# Patient Record
Sex: Female | Born: 1950
Health system: Southern US, Community
[De-identification: ages and names within clinical notes are randomized; demographics above are authoritative.]

## PROBLEM LIST (undated history)

## (undated) DIAGNOSIS — M1990 Unspecified osteoarthritis, unspecified site: Secondary | ICD-10-CM

## (undated) DIAGNOSIS — N3010 Interstitial cystitis (chronic) without hematuria: Secondary | ICD-10-CM

## (undated) DIAGNOSIS — Z5189 Encounter for other specified aftercare: Secondary | ICD-10-CM

## (undated) DIAGNOSIS — R0981 Nasal congestion: Secondary | ICD-10-CM

## (undated) DIAGNOSIS — K625 Hemorrhage of anus and rectum: Secondary | ICD-10-CM

## (undated) DIAGNOSIS — M7989 Other specified soft tissue disorders: Secondary | ICD-10-CM

## (undated) DIAGNOSIS — I5030 Unspecified diastolic (congestive) heart failure: Secondary | ICD-10-CM

## (undated) DIAGNOSIS — R112 Nausea with vomiting, unspecified: Secondary | ICD-10-CM

## (undated) DIAGNOSIS — G44009 Cluster headache syndrome, unspecified, not intractable: Secondary | ICD-10-CM

## (undated) DIAGNOSIS — J029 Acute pharyngitis, unspecified: Secondary | ICD-10-CM

## (undated) DIAGNOSIS — M797 Fibromyalgia: Secondary | ICD-10-CM

## (undated) DIAGNOSIS — K449 Diaphragmatic hernia without obstruction or gangrene: Secondary | ICD-10-CM

## (undated) DIAGNOSIS — K439 Ventral hernia without obstruction or gangrene: Secondary | ICD-10-CM

## (undated) DIAGNOSIS — I639 Cerebral infarction, unspecified: Secondary | ICD-10-CM

## (undated) DIAGNOSIS — I509 Heart failure, unspecified: Secondary | ICD-10-CM

## (undated) DIAGNOSIS — R05 Cough: Secondary | ICD-10-CM

## (undated) DIAGNOSIS — L309 Dermatitis, unspecified: Secondary | ICD-10-CM

## (undated) DIAGNOSIS — I498 Other specified cardiac arrhythmias: Secondary | ICD-10-CM

## (undated) DIAGNOSIS — K219 Gastro-esophageal reflux disease without esophagitis: Secondary | ICD-10-CM

## (undated) DIAGNOSIS — R059 Cough, unspecified: Secondary | ICD-10-CM

## (undated) DIAGNOSIS — K209 Esophagitis, unspecified without bleeding: Secondary | ICD-10-CM

## (undated) DIAGNOSIS — H539 Unspecified visual disturbance: Secondary | ICD-10-CM

## (undated) DIAGNOSIS — I10 Essential (primary) hypertension: Secondary | ICD-10-CM

## (undated) DIAGNOSIS — F32A Depression, unspecified: Secondary | ICD-10-CM

## (undated) DIAGNOSIS — J189 Pneumonia, unspecified organism: Secondary | ICD-10-CM

## (undated) DIAGNOSIS — R053 Chronic cough: Secondary | ICD-10-CM

## (undated) DIAGNOSIS — D649 Anemia, unspecified: Secondary | ICD-10-CM

## (undated) DIAGNOSIS — T7840XA Allergy, unspecified, initial encounter: Secondary | ICD-10-CM

## (undated) DIAGNOSIS — F329 Major depressive disorder, single episode, unspecified: Secondary | ICD-10-CM

## (undated) DIAGNOSIS — F419 Anxiety disorder, unspecified: Secondary | ICD-10-CM

## (undated) DIAGNOSIS — M549 Dorsalgia, unspecified: Secondary | ICD-10-CM

## (undated) DIAGNOSIS — R350 Frequency of micturition: Secondary | ICD-10-CM

## (undated) DIAGNOSIS — G894 Chronic pain syndrome: Secondary | ICD-10-CM

## (undated) DIAGNOSIS — R011 Cardiac murmur, unspecified: Secondary | ICD-10-CM

## (undated) DIAGNOSIS — G8929 Other chronic pain: Secondary | ICD-10-CM

## (undated) DIAGNOSIS — N189 Chronic kidney disease, unspecified: Secondary | ICD-10-CM

## (undated) DIAGNOSIS — G629 Polyneuropathy, unspecified: Secondary | ICD-10-CM

## (undated) DIAGNOSIS — R140 Abdominal distension (gaseous): Secondary | ICD-10-CM

## (undated) DIAGNOSIS — R39198 Other difficulties with micturition: Secondary | ICD-10-CM

## (undated) DIAGNOSIS — K859 Acute pancreatitis without necrosis or infection, unspecified: Secondary | ICD-10-CM

## (undated) DIAGNOSIS — R509 Fever, unspecified: Secondary | ICD-10-CM

## (undated) DIAGNOSIS — IMO0001 Reserved for inherently not codable concepts without codable children: Secondary | ICD-10-CM

## (undated) HISTORY — DX: Cerebral infarction, unspecified: I63.9

## (undated) HISTORY — DX: Allergy, unspecified, initial encounter: T78.40XA

## (undated) HISTORY — PX: ABDOMINAL HYSTERECTOMY: SHX81

## (undated) HISTORY — DX: Cough: R05

## (undated) HISTORY — DX: Acute pharyngitis, unspecified: J02.9

## (undated) HISTORY — DX: Anemia, unspecified: D64.9

## (undated) HISTORY — DX: Esophagitis, unspecified without bleeding: K20.90

## (undated) HISTORY — DX: Hemorrhage of anus and rectum: K62.5

## (undated) HISTORY — DX: Unspecified osteoarthritis, unspecified site: M19.90

## (undated) HISTORY — DX: Cardiac murmur, unspecified: R01.1

## (undated) HISTORY — PX: DENTAL SURGERY: SHX609

## (undated) HISTORY — DX: Essential (primary) hypertension: I10

## (undated) HISTORY — DX: Nasal congestion: R09.81

## (undated) HISTORY — DX: Unspecified diastolic (congestive) heart failure: I50.30

## (undated) HISTORY — DX: Chronic pain syndrome: G89.4

## (undated) HISTORY — DX: Fever, unspecified: R50.9

## (undated) HISTORY — DX: Unspecified visual disturbance: H53.9

## (undated) HISTORY — PX: CHOLECYSTECTOMY: SHX55

## (undated) HISTORY — DX: Other difficulties with micturition: R39.198

## (undated) HISTORY — DX: Depression, unspecified: F32.A

## (undated) HISTORY — DX: Cough, unspecified: R05.9

## (undated) HISTORY — DX: Fibromyalgia: M79.7

## (undated) HISTORY — DX: Cluster headache syndrome, unspecified, not intractable: G44.009

## (undated) HISTORY — DX: Nausea with vomiting, unspecified: R11.2

## (undated) HISTORY — DX: Major depressive disorder, single episode, unspecified: F32.9

## (undated) HISTORY — PX: OTHER SURGICAL HISTORY: SHX169

## (undated) HISTORY — DX: Diaphragmatic hernia without obstruction or gangrene: K44.9

## (undated) HISTORY — DX: Gastro-esophageal reflux disease without esophagitis: K21.9

## (undated) HISTORY — DX: Acute pancreatitis without necrosis or infection, unspecified: K85.90

## (undated) HISTORY — DX: Abdominal distension (gaseous): R14.0

## (undated) HISTORY — DX: Ventral hernia without obstruction or gangrene: K43.9

## (undated) HISTORY — DX: Other specified soft tissue disorders: M79.89

## (undated) HISTORY — PX: HERNIA REPAIR: SHX51

## (undated) HISTORY — PX: UPPER GASTROINTESTINAL ENDOSCOPY: SHX188

---

## 1997-10-02 ENCOUNTER — Encounter: Admission: RE | Admit: 1997-10-02 | Discharge: 1997-10-02 | Payer: Self-pay | Admitting: Family Medicine

## 1997-10-06 ENCOUNTER — Encounter: Admission: RE | Admit: 1997-10-06 | Discharge: 1997-10-06 | Payer: Self-pay | Admitting: Family Medicine

## 1997-10-18 ENCOUNTER — Encounter: Admission: RE | Admit: 1997-10-18 | Discharge: 1997-10-18 | Payer: Self-pay | Admitting: Family Medicine

## 1997-10-20 ENCOUNTER — Encounter: Admission: RE | Admit: 1997-10-20 | Discharge: 1997-10-20 | Payer: Self-pay | Admitting: Family Medicine

## 1997-10-30 ENCOUNTER — Encounter: Admission: RE | Admit: 1997-10-30 | Discharge: 1997-10-30 | Payer: Self-pay | Admitting: Family Medicine

## 1998-01-09 ENCOUNTER — Encounter: Admission: RE | Admit: 1998-01-09 | Discharge: 1998-01-09 | Payer: Self-pay | Admitting: Family Medicine

## 1998-01-25 ENCOUNTER — Encounter: Admission: RE | Admit: 1998-01-25 | Discharge: 1998-01-25 | Payer: Self-pay | Admitting: Family Medicine

## 1998-02-12 ENCOUNTER — Encounter: Admission: RE | Admit: 1998-02-12 | Discharge: 1998-02-12 | Payer: Self-pay | Admitting: Family Medicine

## 1998-02-20 ENCOUNTER — Encounter: Admission: RE | Admit: 1998-02-20 | Discharge: 1998-02-20 | Payer: Self-pay | Admitting: Family Medicine

## 1998-03-02 ENCOUNTER — Encounter: Admission: RE | Admit: 1998-03-02 | Discharge: 1998-03-02 | Payer: Self-pay | Admitting: Family Medicine

## 1998-03-08 ENCOUNTER — Encounter: Admission: RE | Admit: 1998-03-08 | Discharge: 1998-03-08 | Payer: Self-pay | Admitting: Family Medicine

## 1998-03-26 ENCOUNTER — Encounter: Payer: Self-pay | Admitting: Emergency Medicine

## 1998-03-26 ENCOUNTER — Inpatient Hospital Stay (HOSPITAL_COMMUNITY): Admission: EM | Admit: 1998-03-26 | Discharge: 1998-03-28 | Payer: Self-pay | Admitting: Emergency Medicine

## 1998-03-29 ENCOUNTER — Encounter: Admission: RE | Admit: 1998-03-29 | Discharge: 1998-03-29 | Payer: Self-pay | Admitting: Family Medicine

## 1998-04-05 ENCOUNTER — Encounter: Admission: RE | Admit: 1998-04-05 | Discharge: 1998-04-05 | Payer: Self-pay | Admitting: Family Medicine

## 1998-06-19 ENCOUNTER — Encounter: Admission: RE | Admit: 1998-06-19 | Discharge: 1998-06-19 | Payer: Self-pay | Admitting: Family Medicine

## 1998-07-25 ENCOUNTER — Encounter: Admission: RE | Admit: 1998-07-25 | Discharge: 1998-07-25 | Payer: Self-pay | Admitting: Family Medicine

## 1998-07-27 ENCOUNTER — Ambulatory Visit (HOSPITAL_COMMUNITY): Admission: RE | Admit: 1998-07-27 | Discharge: 1998-07-27 | Payer: Self-pay | Admitting: *Deleted

## 1998-08-09 ENCOUNTER — Encounter: Admission: RE | Admit: 1998-08-09 | Discharge: 1998-08-09 | Payer: Self-pay | Admitting: Family Medicine

## 1998-08-22 ENCOUNTER — Encounter: Admission: RE | Admit: 1998-08-22 | Discharge: 1998-08-22 | Payer: Self-pay | Admitting: Family Medicine

## 1998-09-22 ENCOUNTER — Encounter: Payer: Self-pay | Admitting: Emergency Medicine

## 1998-09-22 ENCOUNTER — Emergency Department (HOSPITAL_COMMUNITY): Admission: EM | Admit: 1998-09-22 | Discharge: 1998-09-22 | Payer: Self-pay | Admitting: Emergency Medicine

## 1999-01-24 ENCOUNTER — Encounter: Admission: RE | Admit: 1999-01-24 | Discharge: 1999-01-24 | Payer: Self-pay | Admitting: Family Medicine

## 1999-02-18 ENCOUNTER — Emergency Department (HOSPITAL_COMMUNITY): Admission: EM | Admit: 1999-02-18 | Discharge: 1999-02-18 | Payer: Self-pay | Admitting: Emergency Medicine

## 1999-02-20 ENCOUNTER — Emergency Department (HOSPITAL_COMMUNITY): Admission: EM | Admit: 1999-02-20 | Discharge: 1999-02-20 | Payer: Self-pay | Admitting: Internal Medicine

## 1999-03-01 ENCOUNTER — Emergency Department (HOSPITAL_COMMUNITY): Admission: EM | Admit: 1999-03-01 | Discharge: 1999-03-01 | Payer: Self-pay | Admitting: Emergency Medicine

## 1999-03-03 ENCOUNTER — Emergency Department (HOSPITAL_COMMUNITY): Admission: EM | Admit: 1999-03-03 | Discharge: 1999-03-03 | Payer: Self-pay | Admitting: Emergency Medicine

## 1999-03-05 ENCOUNTER — Emergency Department (HOSPITAL_COMMUNITY): Admission: EM | Admit: 1999-03-05 | Discharge: 1999-03-05 | Payer: Self-pay | Admitting: Emergency Medicine

## 1999-03-23 ENCOUNTER — Ambulatory Visit (HOSPITAL_COMMUNITY): Admission: RE | Admit: 1999-03-23 | Discharge: 1999-03-23 | Payer: Self-pay | Admitting: Orthopedic Surgery

## 1999-03-23 ENCOUNTER — Encounter: Payer: Self-pay | Admitting: Orthopedic Surgery

## 1999-05-22 ENCOUNTER — Emergency Department (HOSPITAL_COMMUNITY): Admission: EM | Admit: 1999-05-22 | Discharge: 1999-05-23 | Payer: Self-pay | Admitting: Emergency Medicine

## 1999-05-24 ENCOUNTER — Encounter: Admission: RE | Admit: 1999-05-24 | Discharge: 1999-05-24 | Payer: Self-pay | Admitting: Family Medicine

## 1999-05-24 ENCOUNTER — Encounter: Payer: Self-pay | Admitting: Sports Medicine

## 1999-05-24 ENCOUNTER — Encounter: Admission: RE | Admit: 1999-05-24 | Discharge: 1999-05-24 | Payer: Self-pay | Admitting: Sports Medicine

## 1999-06-12 ENCOUNTER — Encounter: Admission: RE | Admit: 1999-06-12 | Discharge: 1999-06-12 | Payer: Self-pay | Admitting: Family Medicine

## 1999-06-28 ENCOUNTER — Encounter: Payer: Self-pay | Admitting: Orthopedic Surgery

## 1999-06-28 ENCOUNTER — Ambulatory Visit (HOSPITAL_COMMUNITY): Admission: RE | Admit: 1999-06-28 | Discharge: 1999-06-28 | Payer: Self-pay | Admitting: Orthopedic Surgery

## 1999-07-25 ENCOUNTER — Encounter: Admission: RE | Admit: 1999-07-25 | Discharge: 1999-07-25 | Payer: Self-pay | Admitting: Family Medicine

## 1999-07-29 ENCOUNTER — Encounter: Admission: RE | Admit: 1999-07-29 | Discharge: 1999-07-29 | Payer: Self-pay | Admitting: Family Medicine

## 1999-09-25 ENCOUNTER — Encounter: Admission: RE | Admit: 1999-09-25 | Discharge: 1999-09-25 | Payer: Self-pay | Admitting: Family Medicine

## 2000-01-01 ENCOUNTER — Encounter: Admission: RE | Admit: 2000-01-01 | Discharge: 2000-01-01 | Payer: Self-pay | Admitting: Family Medicine

## 2000-01-22 ENCOUNTER — Encounter: Admission: RE | Admit: 2000-01-22 | Discharge: 2000-01-22 | Payer: Self-pay | Admitting: Sports Medicine

## 2000-03-24 ENCOUNTER — Encounter: Admission: RE | Admit: 2000-03-24 | Discharge: 2000-03-24 | Payer: Self-pay | Admitting: Family Medicine

## 2000-05-25 ENCOUNTER — Encounter: Admission: RE | Admit: 2000-05-25 | Discharge: 2000-05-25 | Payer: Self-pay | Admitting: Family Medicine

## 2000-07-13 ENCOUNTER — Encounter: Admission: RE | Admit: 2000-07-13 | Discharge: 2000-07-13 | Payer: Self-pay | Admitting: Family Medicine

## 2000-07-13 ENCOUNTER — Encounter: Admission: RE | Admit: 2000-07-13 | Discharge: 2000-07-13 | Payer: Self-pay | Admitting: *Deleted

## 2000-07-16 ENCOUNTER — Encounter: Admission: RE | Admit: 2000-07-16 | Discharge: 2000-07-16 | Payer: Self-pay | Admitting: Family Medicine

## 2000-07-22 ENCOUNTER — Encounter: Admission: RE | Admit: 2000-07-22 | Discharge: 2000-07-22 | Payer: Self-pay | Admitting: Family Medicine

## 2000-10-07 ENCOUNTER — Encounter: Admission: RE | Admit: 2000-10-07 | Discharge: 2000-10-07 | Payer: Self-pay | Admitting: Family Medicine

## 2000-10-09 ENCOUNTER — Encounter: Admission: RE | Admit: 2000-10-09 | Discharge: 2000-10-09 | Payer: Self-pay | Admitting: Family Medicine

## 2000-10-13 ENCOUNTER — Encounter: Admission: RE | Admit: 2000-10-13 | Discharge: 2000-10-13 | Payer: Self-pay | Admitting: Family Medicine

## 2000-10-13 ENCOUNTER — Ambulatory Visit (HOSPITAL_COMMUNITY): Admission: RE | Admit: 2000-10-13 | Discharge: 2000-10-13 | Payer: Self-pay | Admitting: Family Medicine

## 2000-11-20 ENCOUNTER — Encounter: Admission: RE | Admit: 2000-11-20 | Discharge: 2000-11-20 | Payer: Self-pay | Admitting: Family Medicine

## 2000-12-24 ENCOUNTER — Encounter: Admission: RE | Admit: 2000-12-24 | Discharge: 2000-12-24 | Payer: Self-pay | Admitting: Family Medicine

## 2000-12-28 ENCOUNTER — Encounter: Admission: RE | Admit: 2000-12-28 | Discharge: 2000-12-28 | Payer: Self-pay | Admitting: Family Medicine

## 2001-05-21 ENCOUNTER — Encounter: Admission: RE | Admit: 2001-05-21 | Discharge: 2001-05-21 | Payer: Self-pay | Admitting: Family Medicine

## 2001-05-24 ENCOUNTER — Encounter: Admission: RE | Admit: 2001-05-24 | Discharge: 2001-05-24 | Payer: Self-pay | Admitting: Family Medicine

## 2001-09-10 ENCOUNTER — Encounter: Admission: RE | Admit: 2001-09-10 | Discharge: 2001-09-10 | Payer: Self-pay | Admitting: Family Medicine

## 2001-11-26 ENCOUNTER — Encounter: Admission: RE | Admit: 2001-11-26 | Discharge: 2001-11-26 | Payer: Self-pay | Admitting: Family Medicine

## 2001-11-29 ENCOUNTER — Encounter: Admission: RE | Admit: 2001-11-29 | Discharge: 2001-11-29 | Payer: Self-pay | Admitting: Family Medicine

## 2002-09-21 ENCOUNTER — Encounter: Admission: RE | Admit: 2002-09-21 | Discharge: 2002-09-21 | Payer: Self-pay | Admitting: Family Medicine

## 2003-01-05 ENCOUNTER — Emergency Department (HOSPITAL_COMMUNITY): Admission: EM | Admit: 2003-01-05 | Discharge: 2003-01-06 | Payer: Self-pay | Admitting: Emergency Medicine

## 2003-01-06 ENCOUNTER — Encounter: Payer: Self-pay | Admitting: Emergency Medicine

## 2003-01-10 ENCOUNTER — Encounter: Admission: RE | Admit: 2003-01-10 | Discharge: 2003-01-10 | Payer: Self-pay | Admitting: Family Medicine

## 2003-02-17 ENCOUNTER — Encounter: Admission: RE | Admit: 2003-02-17 | Discharge: 2003-02-17 | Payer: Self-pay | Admitting: Family Medicine

## 2003-03-02 ENCOUNTER — Encounter: Admission: RE | Admit: 2003-03-02 | Discharge: 2003-03-02 | Payer: Self-pay | Admitting: Family Medicine

## 2003-03-10 ENCOUNTER — Encounter: Admission: RE | Admit: 2003-03-10 | Discharge: 2003-03-10 | Payer: Self-pay | Admitting: Family Medicine

## 2003-03-13 ENCOUNTER — Encounter: Admission: RE | Admit: 2003-03-13 | Discharge: 2003-03-13 | Payer: Self-pay | Admitting: Sports Medicine

## 2003-05-29 ENCOUNTER — Encounter: Admission: RE | Admit: 2003-05-29 | Discharge: 2003-05-29 | Payer: Self-pay | Admitting: Family Medicine

## 2003-06-29 ENCOUNTER — Encounter: Admission: RE | Admit: 2003-06-29 | Discharge: 2003-06-29 | Payer: Self-pay | Admitting: Family Medicine

## 2004-02-23 ENCOUNTER — Ambulatory Visit: Payer: Self-pay | Admitting: Family Medicine

## 2004-05-23 ENCOUNTER — Ambulatory Visit: Payer: Self-pay | Admitting: Family Medicine

## 2004-06-03 ENCOUNTER — Emergency Department (HOSPITAL_COMMUNITY): Admission: EM | Admit: 2004-06-03 | Discharge: 2004-06-03 | Payer: Self-pay | Admitting: Emergency Medicine

## 2004-06-05 ENCOUNTER — Ambulatory Visit: Payer: Self-pay | Admitting: Sports Medicine

## 2004-06-26 ENCOUNTER — Encounter: Admission: RE | Admit: 2004-06-26 | Discharge: 2004-06-26 | Payer: Self-pay | Admitting: Family Medicine

## 2004-06-28 ENCOUNTER — Ambulatory Visit: Payer: Self-pay | Admitting: Family Medicine

## 2004-07-05 ENCOUNTER — Encounter: Admission: RE | Admit: 2004-07-05 | Discharge: 2004-07-05 | Payer: Self-pay | Admitting: Sports Medicine

## 2004-07-25 ENCOUNTER — Ambulatory Visit: Payer: Self-pay | Admitting: Sports Medicine

## 2004-08-24 ENCOUNTER — Ambulatory Visit (HOSPITAL_COMMUNITY): Admission: RE | Admit: 2004-08-24 | Discharge: 2004-08-24 | Payer: Self-pay | Admitting: Otolaryngology

## 2004-09-06 ENCOUNTER — Ambulatory Visit (HOSPITAL_COMMUNITY): Admission: RE | Admit: 2004-09-06 | Discharge: 2004-09-06 | Payer: Self-pay | Admitting: Otolaryngology

## 2004-11-19 ENCOUNTER — Ambulatory Visit: Payer: Self-pay | Admitting: Sports Medicine

## 2005-03-26 ENCOUNTER — Ambulatory Visit: Payer: Self-pay | Admitting: Family Medicine

## 2005-04-21 DIAGNOSIS — K219 Gastro-esophageal reflux disease without esophagitis: Secondary | ICD-10-CM

## 2005-04-21 HISTORY — PX: COLONOSCOPY: SHX174

## 2005-04-21 HISTORY — DX: Gastro-esophageal reflux disease without esophagitis: K21.9

## 2005-06-30 ENCOUNTER — Ambulatory Visit: Payer: Self-pay | Admitting: Sports Medicine

## 2005-08-19 ENCOUNTER — Encounter (INDEPENDENT_AMBULATORY_CARE_PROVIDER_SITE_OTHER): Payer: Self-pay | Admitting: *Deleted

## 2005-08-19 LAB — CONVERTED CEMR LAB

## 2005-08-20 ENCOUNTER — Ambulatory Visit: Payer: Self-pay | Admitting: Family Medicine

## 2005-10-29 ENCOUNTER — Emergency Department (HOSPITAL_COMMUNITY): Admission: EM | Admit: 2005-10-29 | Discharge: 2005-10-29 | Payer: Self-pay | Admitting: Family Medicine

## 2005-10-31 ENCOUNTER — Ambulatory Visit: Payer: Self-pay | Admitting: Family Medicine

## 2006-01-23 ENCOUNTER — Encounter (INDEPENDENT_AMBULATORY_CARE_PROVIDER_SITE_OTHER): Payer: Self-pay | Admitting: Specialist

## 2006-01-23 ENCOUNTER — Ambulatory Visit (HOSPITAL_COMMUNITY): Admission: RE | Admit: 2006-01-23 | Discharge: 2006-01-23 | Payer: Self-pay | Admitting: Gastroenterology

## 2006-06-19 ENCOUNTER — Encounter (INDEPENDENT_AMBULATORY_CARE_PROVIDER_SITE_OTHER): Payer: Self-pay | Admitting: *Deleted

## 2006-07-24 ENCOUNTER — Encounter (INDEPENDENT_AMBULATORY_CARE_PROVIDER_SITE_OTHER): Payer: Self-pay | Admitting: Family Medicine

## 2006-10-20 ENCOUNTER — Encounter: Payer: Self-pay | Admitting: *Deleted

## 2006-10-21 ENCOUNTER — Encounter (INDEPENDENT_AMBULATORY_CARE_PROVIDER_SITE_OTHER): Payer: Self-pay | Admitting: Family Medicine

## 2006-10-21 ENCOUNTER — Ambulatory Visit: Payer: Self-pay | Admitting: Family Medicine

## 2006-10-21 DIAGNOSIS — I10 Essential (primary) hypertension: Secondary | ICD-10-CM | POA: Insufficient documentation

## 2006-10-21 DIAGNOSIS — R32 Unspecified urinary incontinence: Secondary | ICD-10-CM | POA: Insufficient documentation

## 2006-10-21 LAB — CONVERTED CEMR LAB
ALT: 17 units/L (ref 0–35)
AST: 19 units/L (ref 0–37)
Albumin: 4.6 g/dL (ref 3.5–5.2)
Alkaline Phosphatase: 62 units/L (ref 39–117)
BUN: 18 mg/dL (ref 6–23)
Bilirubin Urine: NEGATIVE
Blood in Urine, dipstick: NEGATIVE
CO2: 26 meq/L (ref 19–32)
Calcium: 9.9 mg/dL (ref 8.4–10.5)
Chloride: 100 meq/L (ref 96–112)
Creatinine, Ser: 0.86 mg/dL (ref 0.40–1.20)
Glucose, Bld: 103 mg/dL — ABNORMAL HIGH (ref 70–99)
Glucose, Urine, Semiquant: NEGATIVE
HCT: 39.8 % (ref 36.0–46.0)
Hemoglobin: 12.3 g/dL (ref 12.0–15.0)
Ketones, urine, test strip: NEGATIVE
MCHC: 30.9 g/dL (ref 30.0–36.0)
MCV: 91.7 fL (ref 78.0–100.0)
Nitrite: NEGATIVE
Platelets: 229 10*3/uL (ref 150–400)
Potassium: 4.3 meq/L (ref 3.5–5.3)
Protein, U semiquant: NEGATIVE
RBC: 4.34 M/uL (ref 3.87–5.11)
RDW: 13.4 % (ref 11.5–14.0)
Sodium: 140 meq/L (ref 135–145)
Specific Gravity, Urine: 1.025
TSH: 1.789 microintl units/mL (ref 0.350–5.50)
Total Bilirubin: 0.5 mg/dL (ref 0.3–1.2)
Total Protein: 8 g/dL (ref 6.0–8.3)
Urobilinogen, UA: NEGATIVE
WBC Urine, dipstick: NEGATIVE
WBC: 5 10*3/uL (ref 4.0–10.5)
pH: 5.5

## 2006-10-22 ENCOUNTER — Encounter (INDEPENDENT_AMBULATORY_CARE_PROVIDER_SITE_OTHER): Payer: Self-pay | Admitting: Family Medicine

## 2007-02-05 ENCOUNTER — Encounter: Payer: Self-pay | Admitting: Family Medicine

## 2007-02-25 ENCOUNTER — Encounter (INDEPENDENT_AMBULATORY_CARE_PROVIDER_SITE_OTHER): Payer: Self-pay | Admitting: *Deleted

## 2007-04-22 HISTORY — PX: CYSTOSCOPY: SUR368

## 2007-05-12 ENCOUNTER — Telehealth: Payer: Self-pay | Admitting: *Deleted

## 2007-05-19 ENCOUNTER — Ambulatory Visit: Payer: Self-pay | Admitting: Family Medicine

## 2007-08-10 ENCOUNTER — Encounter: Payer: Self-pay | Admitting: Family Medicine

## 2007-08-10 ENCOUNTER — Ambulatory Visit: Payer: Self-pay | Admitting: Family Medicine

## 2007-08-10 LAB — CONVERTED CEMR LAB
ALT: 23 units/L (ref 0–35)
AST: 21 units/L (ref 0–37)
Albumin: 4.4 g/dL (ref 3.5–5.2)
Alkaline Phosphatase: 61 units/L (ref 39–117)
BUN: 20 mg/dL (ref 6–23)
Bilirubin Urine: NEGATIVE
Blood in Urine, dipstick: NEGATIVE
CO2: 26 meq/L (ref 19–32)
Calcium: 9.3 mg/dL (ref 8.4–10.5)
Chloride: 101 meq/L (ref 96–112)
Cholesterol: 200 mg/dL (ref 0–200)
Creatinine, Ser: 0.87 mg/dL (ref 0.40–1.20)
Glucose, Bld: 110 mg/dL — ABNORMAL HIGH (ref 70–99)
Glucose, Urine, Semiquant: NEGATIVE
HDL: 51 mg/dL (ref >39)
Ketones, urine, test strip: NEGATIVE
LDL Cholesterol: 91 mg/dL (ref 0–99)
Nitrite: NEGATIVE
Pap Smear: NORMAL
Potassium: 4.1 meq/L (ref 3.5–5.3)
Protein, U semiquant: NEGATIVE
Sodium: 140 meq/L (ref 135–145)
Specific Gravity, Urine: 1.02
Total Bilirubin: 0.4 mg/dL (ref 0.3–1.2)
Total CHOL/HDL Ratio: 3.9
Total Protein: 7.8 g/dL (ref 6.0–8.3)
Triglycerides: 290 mg/dL — ABNORMAL HIGH (ref <150)
Urobilinogen, UA: 0.2
VLDL: 58 mg/dL — ABNORMAL HIGH (ref 0–40)
WBC Urine, dipstick: NEGATIVE
pH: 7

## 2007-08-23 ENCOUNTER — Encounter: Payer: Self-pay | Admitting: Family Medicine

## 2007-11-24 ENCOUNTER — Ambulatory Visit: Payer: Self-pay | Admitting: Family Medicine

## 2007-11-24 LAB — CONVERTED CEMR LAB
Bilirubin Urine: NEGATIVE
Blood in Urine, dipstick: NEGATIVE
Glucose, Urine, Semiquant: NEGATIVE
Ketones, urine, test strip: NEGATIVE
Nitrite: NEGATIVE
Protein, U semiquant: NEGATIVE
Specific Gravity, Urine: 1.02
Urobilinogen, UA: 0.2
WBC Urine, dipstick: NEGATIVE
pH: 6.5

## 2007-12-26 ENCOUNTER — Emergency Department (HOSPITAL_COMMUNITY): Admission: EM | Admit: 2007-12-26 | Discharge: 2007-12-26 | Payer: Self-pay | Admitting: Emergency Medicine

## 2008-01-24 ENCOUNTER — Ambulatory Visit: Payer: Self-pay | Admitting: Family Medicine

## 2008-01-24 ENCOUNTER — Encounter: Payer: Self-pay | Admitting: Family Medicine

## 2008-02-08 ENCOUNTER — Telehealth: Payer: Self-pay | Admitting: *Deleted

## 2008-02-09 ENCOUNTER — Telehealth: Payer: Self-pay | Admitting: *Deleted

## 2008-02-11 ENCOUNTER — Encounter: Admission: RE | Admit: 2008-02-11 | Discharge: 2008-02-11 | Payer: Self-pay | Admitting: Family Medicine

## 2008-02-11 ENCOUNTER — Ambulatory Visit: Payer: Self-pay | Admitting: Family Medicine

## 2008-02-11 DIAGNOSIS — K219 Gastro-esophageal reflux disease without esophagitis: Secondary | ICD-10-CM | POA: Insufficient documentation

## 2008-02-21 ENCOUNTER — Telehealth (INDEPENDENT_AMBULATORY_CARE_PROVIDER_SITE_OTHER): Payer: Self-pay | Admitting: *Deleted

## 2008-03-01 ENCOUNTER — Ambulatory Visit: Payer: Self-pay | Admitting: Gastroenterology

## 2008-03-03 ENCOUNTER — Ambulatory Visit (HOSPITAL_BASED_OUTPATIENT_CLINIC_OR_DEPARTMENT_OTHER): Admission: RE | Admit: 2008-03-03 | Discharge: 2008-03-03 | Payer: Self-pay | Admitting: Urology

## 2008-03-10 ENCOUNTER — Ambulatory Visit: Payer: Self-pay | Admitting: Obstetrics & Gynecology

## 2008-03-24 ENCOUNTER — Ambulatory Visit (HOSPITAL_COMMUNITY): Admission: RE | Admit: 2008-03-24 | Discharge: 2008-03-24 | Payer: Self-pay | Admitting: Obstetrics & Gynecology

## 2008-06-14 ENCOUNTER — Ambulatory Visit: Payer: Self-pay | Admitting: Family Medicine

## 2008-09-13 ENCOUNTER — Encounter: Payer: Self-pay | Admitting: Family Medicine

## 2008-11-01 ENCOUNTER — Emergency Department (HOSPITAL_COMMUNITY): Admission: EM | Admit: 2008-11-01 | Discharge: 2008-11-01 | Payer: Self-pay | Admitting: Emergency Medicine

## 2008-11-30 ENCOUNTER — Emergency Department (HOSPITAL_COMMUNITY): Admission: EM | Admit: 2008-11-30 | Discharge: 2008-11-30 | Payer: Self-pay | Admitting: Emergency Medicine

## 2008-12-06 ENCOUNTER — Encounter: Admission: RE | Admit: 2008-12-06 | Discharge: 2008-12-06 | Payer: Self-pay | Admitting: Occupational Medicine

## 2008-12-30 ENCOUNTER — Encounter (INDEPENDENT_AMBULATORY_CARE_PROVIDER_SITE_OTHER): Payer: Self-pay | Admitting: *Deleted

## 2008-12-30 DIAGNOSIS — Z716 Tobacco abuse counseling: Secondary | ICD-10-CM | POA: Insufficient documentation

## 2009-01-11 ENCOUNTER — Ambulatory Visit (HOSPITAL_COMMUNITY): Admission: RE | Admit: 2009-01-11 | Discharge: 2009-01-11 | Payer: Self-pay | Admitting: Occupational Medicine

## 2009-01-18 ENCOUNTER — Encounter: Admission: RE | Admit: 2009-01-18 | Discharge: 2009-04-18 | Payer: Self-pay | Admitting: Occupational Medicine

## 2009-04-21 HISTORY — PX: EYE SURGERY: SHX253

## 2009-04-23 ENCOUNTER — Encounter: Admission: RE | Admit: 2009-04-23 | Discharge: 2009-07-22 | Payer: Self-pay | Admitting: Occupational Medicine

## 2009-05-14 ENCOUNTER — Encounter: Payer: Self-pay | Admitting: Family Medicine

## 2009-07-02 ENCOUNTER — Encounter: Payer: Self-pay | Admitting: Family Medicine

## 2009-07-23 ENCOUNTER — Encounter: Admission: RE | Admit: 2009-07-23 | Discharge: 2009-10-21 | Payer: Self-pay | Admitting: Occupational Medicine

## 2009-08-14 ENCOUNTER — Inpatient Hospital Stay (HOSPITAL_COMMUNITY): Admission: AD | Admit: 2009-08-14 | Discharge: 2009-08-16 | Payer: 59 | Admitting: Family Medicine

## 2009-08-14 ENCOUNTER — Encounter: Payer: Self-pay | Admitting: Family Medicine

## 2009-08-14 ENCOUNTER — Telehealth (INDEPENDENT_AMBULATORY_CARE_PROVIDER_SITE_OTHER): Payer: Self-pay | Admitting: *Deleted

## 2009-08-14 ENCOUNTER — Ambulatory Visit: Payer: Self-pay | Admitting: Family Medicine

## 2009-08-14 DIAGNOSIS — Z9849 Cataract extraction status, unspecified eye: Secondary | ICD-10-CM | POA: Insufficient documentation

## 2009-08-14 DIAGNOSIS — G894 Chronic pain syndrome: Secondary | ICD-10-CM | POA: Insufficient documentation

## 2009-08-14 DIAGNOSIS — F323 Major depressive disorder, single episode, severe with psychotic features: Secondary | ICD-10-CM | POA: Insufficient documentation

## 2009-08-14 DIAGNOSIS — F32A Depression, unspecified: Secondary | ICD-10-CM | POA: Insufficient documentation

## 2009-08-14 LAB — CONVERTED CEMR LAB
ALT: 29 units/L
AST: 23 units/L
Albumin: 3.8 g/dL
Alkaline Phosphatase: 60 units/L
BUN: 14 mg/dL
CO2: 27 meq/L
Calcium: 9.1 mg/dL
Chloride: 100 meq/L
Creatinine, Ser: 0.7 mg/dL
Glucose, Bld: 177 mg/dL
HCT: 37.9 %
Hemoglobin: 12.9 g/dL
Platelets: 215 10*3/uL
Potassium: 3.9 meq/L
Sodium: 134 meq/L
TSH: 1.486 microintl units/mL
Total Bilirubin: 0.5 mg/dL
Total Protein: 7.3 g/dL
WBC: 9.3 10*3/uL

## 2009-08-17 ENCOUNTER — Telehealth: Payer: Self-pay | Admitting: *Deleted

## 2009-08-23 ENCOUNTER — Telehealth: Payer: Self-pay | Admitting: *Deleted

## 2009-08-24 ENCOUNTER — Telehealth: Payer: Self-pay | Admitting: *Deleted

## 2009-08-27 ENCOUNTER — Ambulatory Visit: Payer: Self-pay | Admitting: Family Medicine

## 2009-08-27 ENCOUNTER — Encounter: Payer: Self-pay | Admitting: Family Medicine

## 2009-08-27 LAB — CONVERTED CEMR LAB
HCT: 40.2 % (ref 36.0–46.0)
Hemoglobin: 13.1 g/dL (ref 12.0–15.0)
MCHC: 32.6 g/dL (ref 30.0–36.0)
MCV: 88.4 fL (ref 78.0–100.0)
Platelets: 201 10*3/uL (ref 150–400)
RBC: 4.55 M/uL (ref 3.87–5.11)
RDW: 14.5 % (ref 11.5–15.5)
WBC: 7.4 10*3/uL (ref 4.0–10.5)

## 2009-08-28 ENCOUNTER — Encounter: Payer: Self-pay | Admitting: Family Medicine

## 2009-08-30 ENCOUNTER — Encounter: Payer: Self-pay | Admitting: Family Medicine

## 2009-08-31 ENCOUNTER — Telehealth: Payer: Self-pay | Admitting: Family Medicine

## 2009-09-10 ENCOUNTER — Encounter: Payer: Self-pay | Admitting: Family Medicine

## 2009-09-10 ENCOUNTER — Ambulatory Visit: Payer: Self-pay | Admitting: Family Medicine

## 2009-09-10 DIAGNOSIS — E1165 Type 2 diabetes mellitus with hyperglycemia: Secondary | ICD-10-CM | POA: Insufficient documentation

## 2009-09-10 LAB — CONVERTED CEMR LAB
ALT: 33 units/L (ref 0–35)
AST: 17 units/L (ref 0–37)
Albumin: 4.2 g/dL (ref 3.5–5.2)
Alkaline Phosphatase: 61 units/L (ref 39–117)
BUN: 17 mg/dL (ref 6–23)
Bilirubin Urine: NEGATIVE
Blood in Urine, dipstick: NEGATIVE
CO2: 24 meq/L (ref 19–32)
Calcium: 9.9 mg/dL (ref 8.4–10.5)
Chloride: 101 meq/L (ref 96–112)
Creatinine, Ser: 0.69 mg/dL (ref 0.40–1.20)
Glucose, Bld: 190 mg/dL — ABNORMAL HIGH (ref 70–99)
Glucose, Urine, Semiquant: NEGATIVE
HCT: 39.8 % (ref 36.0–46.0)
Hemoglobin: 13.1 g/dL (ref 12.0–15.0)
Hgb A1c MFr Bld: 8.4 %
Ketones, urine, test strip: NEGATIVE
MCHC: 32.9 g/dL (ref 30.0–36.0)
MCV: 88.6 fL (ref 78.0–100.0)
Nitrite: NEGATIVE
Platelets: 225 10*3/uL (ref 150–400)
Potassium: 4 meq/L (ref 3.5–5.3)
Pro B Natriuretic peptide (BNP): <2 pg/mL (ref 0.0–100.0)
Protein, U semiquant: NEGATIVE
RBC: 4.49 M/uL (ref 3.87–5.11)
RDW: 14.7 % (ref 11.5–15.5)
Sodium: 137 meq/L (ref 135–145)
Specific Gravity, Urine: 1.01
Total Bilirubin: 0.3 mg/dL (ref 0.3–1.2)
Total Protein: 7.3 g/dL (ref 6.0–8.3)
Urobilinogen, UA: 0.2
WBC Urine, dipstick: NEGATIVE
WBC: 8.6 10*3/uL (ref 4.0–10.5)
pH: 7

## 2009-09-12 ENCOUNTER — Encounter: Payer: Self-pay | Admitting: Family Medicine

## 2009-09-14 ENCOUNTER — Ambulatory Visit: Payer: Self-pay | Admitting: Family Medicine

## 2009-09-18 ENCOUNTER — Telehealth: Payer: Self-pay | Admitting: *Deleted

## 2009-09-19 ENCOUNTER — Ambulatory Visit (HOSPITAL_COMMUNITY): Admission: RE | Admit: 2009-09-19 | Discharge: 2009-09-19 | Payer: Self-pay | Admitting: Family Medicine

## 2009-09-21 ENCOUNTER — Encounter: Payer: Self-pay | Admitting: Family Medicine

## 2009-09-25 ENCOUNTER — Ambulatory Visit: Payer: Self-pay | Admitting: Family Medicine

## 2009-10-02 ENCOUNTER — Telehealth: Payer: Self-pay | Admitting: *Deleted

## 2009-10-15 ENCOUNTER — Ambulatory Visit: Payer: Self-pay | Admitting: Family Medicine

## 2009-10-19 ENCOUNTER — Telehealth: Payer: Self-pay | Admitting: *Deleted

## 2009-10-29 ENCOUNTER — Ambulatory Visit: Payer: Self-pay | Admitting: Family Medicine

## 2009-10-29 ENCOUNTER — Encounter: Payer: Self-pay | Admitting: Family Medicine

## 2009-10-30 LAB — CONVERTED CEMR LAB
BUN: 12 mg/dL (ref 6–23)
CO2: 26 meq/L (ref 19–32)
Calcium: 9.4 mg/dL (ref 8.4–10.5)
Chloride: 95 meq/L — ABNORMAL LOW (ref 96–112)
Creatinine, Ser: 0.95 mg/dL (ref 0.40–1.20)
Glucose, Bld: 228 mg/dL — ABNORMAL HIGH (ref 70–99)
Potassium: 3.7 meq/L (ref 3.5–5.3)
Sodium: 138 meq/L (ref 135–145)

## 2009-11-08 ENCOUNTER — Telehealth: Payer: Self-pay | Admitting: Family Medicine

## 2009-11-12 ENCOUNTER — Telehealth: Payer: Self-pay | Admitting: *Deleted

## 2009-11-14 ENCOUNTER — Ambulatory Visit (HOSPITAL_COMMUNITY): Admission: RE | Admit: 2009-11-14 | Discharge: 2009-11-14 | Payer: Self-pay | Admitting: Family Medicine

## 2009-11-14 ENCOUNTER — Encounter (INDEPENDENT_AMBULATORY_CARE_PROVIDER_SITE_OTHER): Payer: Self-pay | Admitting: Family Medicine

## 2009-11-14 ENCOUNTER — Encounter: Payer: Self-pay | Admitting: Family Medicine

## 2009-11-14 ENCOUNTER — Ambulatory Visit: Payer: Self-pay | Admitting: Cardiology

## 2009-11-21 ENCOUNTER — Encounter: Payer: Self-pay | Admitting: Family Medicine

## 2009-11-26 ENCOUNTER — Telehealth: Payer: Self-pay | Admitting: Family Medicine

## 2009-11-30 ENCOUNTER — Encounter: Payer: Self-pay | Admitting: Family Medicine

## 2009-12-07 ENCOUNTER — Ambulatory Visit: Payer: Self-pay | Admitting: Family Medicine

## 2009-12-14 ENCOUNTER — Ambulatory Visit: Payer: Self-pay | Admitting: Family Medicine

## 2010-01-03 ENCOUNTER — Telehealth: Payer: Self-pay | Admitting: Family Medicine

## 2010-01-04 ENCOUNTER — Ambulatory Visit: Payer: Self-pay | Admitting: Family Medicine

## 2010-01-18 ENCOUNTER — Encounter: Payer: Self-pay | Admitting: Family Medicine

## 2010-01-18 ENCOUNTER — Ambulatory Visit: Payer: Self-pay | Admitting: Family Medicine

## 2010-01-28 ENCOUNTER — Telehealth: Payer: Self-pay | Admitting: *Deleted

## 2010-01-28 ENCOUNTER — Encounter: Payer: Self-pay | Admitting: Family Medicine

## 2010-01-28 ENCOUNTER — Ambulatory Visit: Payer: Self-pay | Admitting: Family Medicine

## 2010-01-28 LAB — CONVERTED CEMR LAB
ALT: 29 units/L (ref 0–35)
AST: 28 units/L (ref 0–37)
Albumin: 4.2 g/dL (ref 3.5–5.2)
Alkaline Phosphatase: 66 units/L (ref 39–117)
BUN: 6 mg/dL (ref 6–23)
Basophils Absolute: 0 10*3/uL (ref 0.0–0.1)
Basophils Relative: 1 % (ref 0–1)
CO2: 29 meq/L (ref 19–32)
Calcium: 9.4 mg/dL (ref 8.4–10.5)
Chloride: 105 meq/L (ref 96–112)
Creatinine, Ser: 0.7 mg/dL (ref 0.40–1.20)
Eosinophils Absolute: 0.1 10*3/uL (ref 0.0–0.7)
Eosinophils Relative: 1 % (ref 0–5)
Glucose, Bld: 175 mg/dL — ABNORMAL HIGH (ref 70–99)
HCT: 39.6 % (ref 36.0–46.0)
Hemoglobin: 13 g/dL (ref 12.0–15.0)
Lymphocytes Relative: 41 % (ref 12–46)
Lymphs Abs: 2.2 10*3/uL (ref 0.7–4.0)
MCHC: 32.8 g/dL (ref 30.0–36.0)
MCV: 85.7 fL (ref 78.0–100.0)
Monocytes Absolute: 0.3 10*3/uL (ref 0.1–1.0)
Monocytes Relative: 6 % (ref 3–12)
Neutro Abs: 2.7 10*3/uL (ref 1.7–7.7)
Neutrophils Relative %: 51 % (ref 43–77)
Platelets: 192 10*3/uL (ref 150–400)
Potassium: 4.3 meq/L (ref 3.5–5.3)
RBC: 4.62 M/uL (ref 3.87–5.11)
RDW: 14.4 % (ref 11.5–15.5)
Sodium: 144 meq/L (ref 135–145)
Total Bilirubin: 0.4 mg/dL (ref 0.3–1.2)
Total Protein: 6.9 g/dL (ref 6.0–8.3)
WBC: 5.4 10*3/uL (ref 4.0–10.5)

## 2010-02-06 ENCOUNTER — Ambulatory Visit: Payer: Self-pay | Admitting: Family Medicine

## 2010-02-06 LAB — CONVERTED CEMR LAB
Bilirubin Urine: NEGATIVE
Blood in Urine, dipstick: NEGATIVE
Glucose, Urine, Semiquant: 100
Ketones, urine, test strip: NEGATIVE
Nitrite: NEGATIVE
Protein, U semiquant: NEGATIVE
Specific Gravity, Urine: 1.025
Urobilinogen, UA: 1
WBC Urine, dipstick: NEGATIVE
pH: 6.5

## 2010-02-13 ENCOUNTER — Ambulatory Visit: Payer: Self-pay | Admitting: Family Medicine

## 2010-02-21 ENCOUNTER — Ambulatory Visit: Payer: Self-pay | Admitting: Family Medicine

## 2010-02-21 ENCOUNTER — Encounter: Payer: Self-pay | Admitting: Family Medicine

## 2010-02-21 ENCOUNTER — Ambulatory Visit (HOSPITAL_COMMUNITY): Admission: RE | Admit: 2010-02-21 | Discharge: 2010-02-21 | Payer: Self-pay | Admitting: Family Medicine

## 2010-02-26 ENCOUNTER — Ambulatory Visit: Payer: Self-pay | Admitting: Family Medicine

## 2010-02-26 DIAGNOSIS — K5900 Constipation, unspecified: Secondary | ICD-10-CM | POA: Insufficient documentation

## 2010-03-04 ENCOUNTER — Ambulatory Visit: Payer: Self-pay | Admitting: Family Medicine

## 2010-03-04 ENCOUNTER — Encounter: Payer: Self-pay | Admitting: *Deleted

## 2010-03-04 LAB — CONVERTED CEMR LAB: Hgb A1c MFr Bld: 8.5 %

## 2010-03-05 ENCOUNTER — Telehealth: Payer: Self-pay | Admitting: *Deleted

## 2010-03-07 ENCOUNTER — Telehealth: Payer: Self-pay | Admitting: *Deleted

## 2010-03-12 ENCOUNTER — Ambulatory Visit: Payer: Self-pay | Admitting: Family Medicine

## 2010-03-19 ENCOUNTER — Encounter (INDEPENDENT_AMBULATORY_CARE_PROVIDER_SITE_OTHER): Payer: Self-pay | Admitting: *Deleted

## 2010-03-26 ENCOUNTER — Ambulatory Visit: Payer: Self-pay | Admitting: Family Medicine

## 2010-03-26 ENCOUNTER — Telehealth (INDEPENDENT_AMBULATORY_CARE_PROVIDER_SITE_OTHER): Payer: Self-pay | Admitting: *Deleted

## 2010-03-26 ENCOUNTER — Encounter: Payer: Self-pay | Admitting: Family Medicine

## 2010-03-26 LAB — CONVERTED CEMR LAB
ALT: 18 units/L (ref 0–35)
AST: 20 units/L (ref 0–37)
Albumin: 4.6 g/dL (ref 3.5–5.2)
Alkaline Phosphatase: 94 units/L (ref 39–117)
BUN: 16 mg/dL (ref 6–23)
Basophils Absolute: 0.1 10*3/uL (ref 0.0–0.1)
Basophils Relative: 1 % (ref 0–1)
CO2: 23 meq/L (ref 19–32)
Calcium: 9.7 mg/dL (ref 8.4–10.5)
Chloride: 100 meq/L (ref 96–112)
Cholesterol: 214 mg/dL — ABNORMAL HIGH (ref 0–200)
Creatinine, Ser: 0.96 mg/dL (ref 0.40–1.20)
Eosinophils Absolute: 0.1 10*3/uL (ref 0.0–0.7)
Eosinophils Relative: 2 % (ref 0–5)
Glucose, Bld: 246 mg/dL — ABNORMAL HIGH (ref 70–99)
HCT: 41.7 % (ref 36.0–46.0)
HDL: 45 mg/dL (ref >39)
HIV: NONREACTIVE
Hemoglobin: 13.5 g/dL (ref 12.0–15.0)
LDL Cholesterol: 123 mg/dL — ABNORMAL HIGH (ref 0–99)
Lipase: 168 units/L — ABNORMAL HIGH (ref 0–75)
Lymphocytes Relative: 38 % (ref 12–46)
Lymphs Abs: 2.6 10*3/uL (ref 0.7–4.0)
MCHC: 32.4 g/dL (ref 30.0–36.0)
MCV: 84.8 fL (ref 78.0–100.0)
Monocytes Absolute: 0.4 10*3/uL (ref 0.1–1.0)
Monocytes Relative: 6 % (ref 3–12)
Neutro Abs: 3.6 10*3/uL (ref 1.7–7.7)
Neutrophils Relative %: 53 % (ref 43–77)
Platelets: 152 10*3/uL (ref 150–400)
Potassium: 4.6 meq/L (ref 3.5–5.3)
RBC: 4.92 M/uL (ref 3.87–5.11)
RDW: 13.5 % (ref 11.5–15.5)
Sodium: 135 meq/L (ref 135–145)
Total Bilirubin: 0.4 mg/dL (ref 0.3–1.2)
Total CHOL/HDL Ratio: 4.8
Total Protein: 8 g/dL (ref 6.0–8.3)
Triglycerides: 228 mg/dL — ABNORMAL HIGH (ref <150)
VLDL: 46 mg/dL — ABNORMAL HIGH (ref 0–40)
WBC: 6.8 10*3/uL (ref 4.0–10.5)

## 2010-03-27 ENCOUNTER — Telehealth (INDEPENDENT_AMBULATORY_CARE_PROVIDER_SITE_OTHER): Payer: Self-pay | Admitting: *Deleted

## 2010-03-28 ENCOUNTER — Encounter: Payer: Self-pay | Admitting: Family Medicine

## 2010-03-28 ENCOUNTER — Inpatient Hospital Stay (HOSPITAL_COMMUNITY)
Admission: EM | Admit: 2010-03-28 | Discharge: 2010-04-02 | Payer: Self-pay | Source: Home / Self Care | Attending: Family Medicine | Admitting: Family Medicine

## 2010-03-29 ENCOUNTER — Telehealth: Payer: Self-pay | Admitting: Family Medicine

## 2010-04-02 ENCOUNTER — Ambulatory Visit: Payer: Self-pay

## 2010-04-05 ENCOUNTER — Ambulatory Visit: Payer: Self-pay | Admitting: Family Medicine

## 2010-04-05 DIAGNOSIS — R109 Unspecified abdominal pain: Secondary | ICD-10-CM | POA: Insufficient documentation

## 2010-04-23 ENCOUNTER — Encounter: Payer: Self-pay | Admitting: Family Medicine

## 2010-04-23 ENCOUNTER — Telehealth: Payer: Self-pay | Admitting: *Deleted

## 2010-04-30 ENCOUNTER — Encounter: Payer: Self-pay | Admitting: *Deleted

## 2010-04-30 ENCOUNTER — Ambulatory Visit: Admission: RE | Admit: 2010-04-30 | Discharge: 2010-04-30 | Payer: Self-pay | Source: Home / Self Care

## 2010-05-09 ENCOUNTER — Telehealth: Payer: Self-pay | Admitting: *Deleted

## 2010-05-09 ENCOUNTER — Encounter: Payer: Self-pay | Admitting: Family Medicine

## 2010-05-09 ENCOUNTER — Ambulatory Visit: Admission: RE | Admit: 2010-05-09 | Discharge: 2010-05-09 | Payer: Self-pay | Source: Home / Self Care

## 2010-05-09 LAB — CONVERTED CEMR LAB
ALT: 20 units/L (ref 0–35)
AST: 21 units/L (ref 0–37)
Albumin: 4.5 g/dL (ref 3.5–5.2)
Alkaline Phosphatase: 75 units/L (ref 39–117)
BUN: 10 mg/dL (ref 6–23)
CO2: 27 meq/L (ref 19–32)
Calcium: 9.5 mg/dL (ref 8.4–10.5)
Chloride: 100 meq/L (ref 96–112)
Creatinine, Ser: 0.83 mg/dL (ref 0.40–1.20)
Glucose, Bld: 170 mg/dL — ABNORMAL HIGH (ref 70–99)
Potassium: 4.4 meq/L (ref 3.5–5.3)
Sodium: 138 meq/L (ref 135–145)
Total Bilirubin: 0.4 mg/dL (ref 0.3–1.2)
Total Protein: 7.3 g/dL (ref 6.0–8.3)

## 2010-05-11 ENCOUNTER — Encounter: Payer: Self-pay | Admitting: Sports Medicine

## 2010-05-12 ENCOUNTER — Encounter: Payer: Self-pay | Admitting: Otolaryngology

## 2010-05-14 ENCOUNTER — Ambulatory Visit: Admit: 2010-05-14 | Payer: Self-pay

## 2010-05-17 ENCOUNTER — Ambulatory Visit: Admission: RE | Admit: 2010-05-17 | Discharge: 2010-05-17 | Payer: Self-pay | Source: Home / Self Care

## 2010-05-22 ENCOUNTER — Ambulatory Visit: Payer: Self-pay | Admitting: Family Medicine

## 2010-05-22 ENCOUNTER — Ambulatory Visit: Admit: 2010-05-22 | Payer: Self-pay

## 2010-05-23 NOTE — Assessment & Plan Note (Signed)
Summary: 1 wk f/u,df   Vital Signs:  Patient profile:   61 year old female Height:      63.5 inches Weight:      203 pounds BMI:     35.52 Temp:     98.7 degrees F oral Pulse rate:   88 / minute BP sitting:   167 / 86  (right arm) Cuff size:   regular  Vitals Entered By: Tessie Fass CMA (February 26, 2010 10:08 AM) CC: F/U Is Patient Diabetic? Yes Pain Assessment Patient in pain? yes     Location: right side of body Intensity: 8   Primary Care Provider:  Helane Rima DO  CC:  F/U.  History of Present Illness: One week follow up apt for ongoing complicated issues related to rehabilitation and depression.  Today she describes pain in her stomach area, got worse after eating sherbert.  Her mouth is still sore and using oral nystatin.  Her bowel are sluggish, she has to push hard to have a BM and does not have very day.  She descirbes still not having an appetite.  Her pain is right sided more than left, her right side shakes when she uses it.  She would like to begin a water aquatic program.  She wants to live.  Current Medications (verified): 1)  Nystatin 100000 Unit/gm Powd (Nystatin) .... Apply To Lower Abdomen Two Times A Day As Needed. Disp 1 Bottle 2)  Adult Diapers .... As Needed.  Disp Enough For Three Times A Day Use For 1 Month. 3)  Onetouch Test  Strp (Glucose Blood) .... Dispense Quantity Sufficient For Twice Daily Blood Glucose Testing. 4)  Onetouch Delica Lancets  Misc (Lancets) .... Dispense Quantity Sufficient For Twice Daily Testing. 5)  Prozac 40 Mg Caps (Fluoxetine Hcl) .... One Daily 6)  Seroquel 200 Mg Tabs (Quetiapine Fumarate) .... 1/2 By Mouth Q Hs 7)  Ambien 10 Mg Tabs (Zolpidem Tartrate) .... One By Mouth Q Hs 8)  Prilosec 20 Mg Cpdr (Omeprazole) .... One By Mouth Bid 9)  Lisinopril 20 Mg Tabs (Lisinopril) .... 2 Tablet By Mouth Daily 10)  Meloxicam 15 Mg Tabs (Meloxicam) .... One By Mouth Daily 11)  Anusol-Hc 2.5 % Crea (Hydrocortisone) ....  Per Box Instructions 12)  Nystatin 100000 Unit/ml Susp (Nystatin) .... 5 Ml Swish, Hold and Swollow Qid X 10 Days 13)  Polyethylene Glycol 3350  Powd (Polyethylene Glycol 3350) .Marland KitchenMarland KitchenMarland Kitchen 17 Gm in 4-6 Oz Water and Drink Daily, Qs  Allergies (verified): 1)  Pristiq (Desvenlafaxine Succinate)  Physical Exam  General:  Looked better than I have ever seen.  With her grandaughter. Abdomen:  epigastric tenderness Msk:  was able to stretch upper extremties in all directions, sat down as soon as she could.  Some tremor on the right that seemed to be in the lowe leg only.  She has symmetrical muscle strength.   Impression & Recommendations:  Problem # 1:  CONSTIPATION (ICD-564.00)  only doing one thing at a time, begin PEG, will see her next week until primary returns Her updated medication list for this problem includes:    Polyethylene Glycol 3350 Powd (Polyethylene glycol 3350) .Marland KitchenMarland KitchenMarland KitchenMarland Kitchen 17 gm in 4-6 oz water and drink daily, qs  Orders: [facility]- Est Level  3 (28413)  Problem # 2:  CANDIDIASIS, ORAL (ICD-112.0)  improved may stop nystatin  Orders: [facility]- Est Level  3 (24401)  Problem # 3:  CHRONIC PAIN SYNDROME (ICD-338.4)  listened, she is going to do some  homework to find out where she can get water therapy.  Orders: [facility]- Est Level  3 (78295)  Complete Medication List: 1)  Nystatin 100000 Unit/gm Powd (Nystatin) .... Apply to lower abdomen two times a day as needed. disp 1 bottle 2)  Adult Diapers  .... As needed.  disp enough for three times a day use for 1 month. 3)  Onetouch Test Strp (Glucose blood) .... Dispense quantity sufficient for twice daily blood glucose testing. 4)  Onetouch Delica Lancets Misc (Lancets) .... Dispense quantity sufficient for twice daily testing. 5)  Prozac 40 Mg Caps (Fluoxetine hcl) .... One daily 6)  Seroquel 200 Mg Tabs (Quetiapine fumarate) .... 1/2 by mouth q hs 7)  Ambien 10 Mg Tabs (Zolpidem tartrate) .... One by mouth q hs 8)  Prilosec 20 Mg Cpdr  (Omeprazole) .... One by mouth bid 9)  Lisinopril 20 Mg Tabs (Lisinopril) .... 2 tablet by mouth daily 10)  Meloxicam 15 Mg Tabs (Meloxicam) .... One by mouth daily 11)  Anusol-hc 2.5 % Crea (Hydrocortisone) .... Per box instructions 12)  Nystatin 100000 Unit/ml Susp (Nystatin) .... 5 ml swish, hold and swollow qid x 10 days 13)  Polyethylene Glycol 3350 Powd (Polyethylene glycol 3350) .Marland KitchenMarland Kitchen. 17 gm in 4-6 oz water and drink daily, qs  Patient Instructions: 1)  Please schedule a follow-up appointment in 1 week with Saxon 2)  Begin the polyethaline glycol for sluggush bowels 3)  Look for a water program Prescriptions: POLYETHYLENE GLYCOL 3350  POWD (POLYETHYLENE GLYCOL 3350) 17 Gm in 4-6 oz water and drink daily, QS  #1 x 1   Entered and Authorized by:   Luretha Murphy NP   Signed by:   Luretha Murphy NP on 02/26/2010   Method used:   Electronically to        [facility] (retail)       [address]       [place], [place]  [id number]       Ph: [phone]       Fax: [phone]   RxID:   [id number]    Orders Added: 1)  [facility]- Est Level  3 [56387]

## 2010-05-23 NOTE — Miscellaneous (Signed)
Summary: re: Gastroenterologist/TS  called Dr.Mann's office. They will not see the pt, because she transferred to another practice. They did not tell me where. Called pt. no answer... x 2. no answering machine. Arlyss Repress CMA,  March 04, 2010 4:19 PM  called pt.Marland KitchenMarland KitchenBlondell Smithers reports that she has been seen by Dr.Mann before and also by another GI doc before, does not know name. I told the pt to call Dr.Mann's office to find out what's going on. She will do that. Arlyss Repress CMA,  March 05, 2010 9:20 AM

## 2010-05-23 NOTE — Assessment & Plan Note (Signed)
Summary: F/U PER MD/RH   Vital Signs:  Patient profile:   60 year old female Height:      63.5 inches Weight:      188 pounds BMI:     32.90 Temp:     98.3 degrees F oral BP sitting:   110 / 60  (left arm) Cuff size:   large  Vitals Entered By: Tessie Fass CMA (April 30, 2010 10:09 AM) CC: abdominal pain. lack of appetite Pain Assessment Patient in pain? yes     Location: abdomen, right arm   Primary Care Provider:  Helane Rima DO  CC:  abdominal pain. lack of appetite.  History of Present Illness: 60 yo F:  1. DMII: Uncontrolled. Did not tolerate Metformin. I have been hesitant to add sulfonylurea as patient has been loosing weight (2/2 decreaed appetite) consistently and I wanted to avoid hypOglycemia. However, patient consistently taking BG and was 300s, so I added Glimiperide at the last visit. Patient states that BG improved to 200-220. Patient continues to lose weight, however. She doesn't like the "taste the pill leaves in my mouth" but continues to take it. Wants to hold on titrating today.  2. Pain: Chronic. Back pain since sink fell on her while working. See previous notes for this. She also c/o body aches, intermittent radicular pain through all limbs, intermittent n/t in extremities. No falls or new injuries. Sometimes feels that the pain is "deep in my bones." Has upcoming appointment with Dr. Meredith Mody, lumbar specialist.   3. Psych: Ran out of Seroquel x 1 month ago. Being followed by Psych in [place]. 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 I have not received reports from them. No SI/HI.  4. Abdominal Pain: Continues (see previous OV for details). Patient needs to make appointment to see GI (see Arlyss Repress notes for more details).   Current Medications (verified): 1)  Adult Diapers .... As Needed.  Disp Enough For Three Times A Day Use For 1 Month. 2)  Onetouch Test  Strp (Glucose Blood) .... Dispense Quantity Sufficient For Twice Daily Blood Glucose Testing. 3)  Onetouch  Delica Lancets  Misc (Lancets) .... Dispense Quantity Sufficient For Twice Daily Testing. 4)  Prozac 40 Mg Caps (Fluoxetine Hcl) .... One Daily 5)  Seroquel 200 Mg Tabs (Quetiapine Fumarate) .... 1/2 By Mouth Q Hs 6)  Ambien 10 Mg Tabs (Zolpidem Tartrate) .... One By Mouth Q Hs 7)  Nexium 40 Mg Cpdr (Esomeprazole Magnesium) .... Take 1 Tab Each Morning 8)  Lisinopril-Hydrochlorothiazide 20-25 Mg Tabs (Lisinopril-Hydrochlorothiazide) .... One Daily 9)  Meloxicam 15 Mg Tabs (Meloxicam) .... One By Mouth Daily 10)  Anusol-Hc 2.5 % Crea (Hydrocortisone) .... Per Box Instructions 11)  Polyethylene Glycol 3350  Powd (Polyethylene Glycol 3350) .Marland KitchenMarland KitchenMarland Kitchen 17 Gm in 4-6 Oz Water and Drink Daily, Qs 12)  Zofran 4 Mg Tabs (Ondansetron Hcl) .Marland Kitchen.. 1 By Mouth Every 8 Hours As Needed For Nausea 13)  Oxycodone-Acetaminophen 5-325 Mg Tabs (Oxycodone-Acetaminophen) .Marland Kitchen.. 1 By Mouth Up To 4 Times Per Day As Needed For Pain 14)  Glimepiride 1 Mg Tabs (Glimepiride) .Marland Kitchen.. 1 By Mouth Daily 15)  Zyrtec Allergy 10 Mg  Tabs (Cetirizine Hcl) .Marland Kitchen.. 1 Once Daily Prn  Allergies (verified): 1)  Pristiq (Desvenlafaxine Succinate) PMH-FH-SH reviewed for relevance  Review of Systems General:  Denies chills and fever. CV:  Denies chest pain or discomfort, palpitations, shortness of breath with exertion, and swelling of feet. Resp:  Denies cough, sputum productive, and wheezing. GI:  Complains of abdominal pain and  nausea; denies constipation, diarrhea, and vomiting. MS:  Complains of joint pain and muscle aches; denies joint redness and joint swelling. Derm:  Denies rash. Neuro:  Complains of numbness and tingling; denies falling down and headaches. Psych:  Complains of anxiety and depression; denies suicidal thoughts/plans and thoughts /plans of harming others.  Physical Exam  General:  Well-developed, well-nourished, in no acute distress; alert, appropriate and cooperative throughout examination. Vitlas reviewed. Lungs:  Lungs  clear to auscultation bilaterally.  Heart:  II/VI systolic murmur.  Abdomen:  Soft, normal bowel sounds, no masses, no guarding, and no rebound tenderness.  Generalized TTP, > epigastric and RUQ. Msk:  Generalized TTP over spine and paraspinal muscles. FROM shoulder, neck. Negative SLR bilaterally. Pulses:  2+ bilateral DP pulses. Extremities:  No edema.   Impression & Recommendations:  Problem # 1:  DIABETES MELLITUS, TYPE II (ICD-250.00) Assessment Improved See #1. Her updated medication list for this problem includes:    Lisinopril-hydrochlorothiazide 20-25 Mg Tabs (Lisinopril-hydrochlorothiazide) ..... One daily    Glimepiride 1 Mg Tabs (Glimepiride) .Marland Kitchen... 1 by mouth daily  Orders: [facility]- Est  Level 4 (99214)  Problem # 2:  CHRONIC PAIN SYNDROME (ICD-338.4) Assessment: Unchanged Continue current treatment. Patient has upcoming appointment with Dr. Graciela Husbands. I would prefer to send to Pain Specialist for this issue. Will need to coordinate with case manager. No red flags. It is difficult to tease out what is somatic. Patient admits that she agrees. Would add Dx of fibromyalgia. Orders: [facility]- Est  Level 4 ([address])  Problem # 3:  DEPRESSION, MAJOR, WITH PSYCHOTIC BEHAVIOR (ICD-298.0) Assessment: Unchanged Will contact case manager to see what is happening in [place]. Patient would benefit from therapy as she has an incredible amount of anger associated with her work injury and subsequent medical care. I suspect underlying psychiatric issues even prior to accident, however. Restart Seroquel. Orders: [facility]- Est  Level 4 ([address]214)  Problem # 4:  ABDOMINAL PAIN OTHER SPECIFIED SITE (ICD-789.09) Assessment: Unchanged No red flags. Pain similar to previous OV. Reviewed negative work-up. Will defer to GI. I am concerned about continued weight loss. Her updated medication list for this problem includes:    Meloxicam 15 Mg Tabs (Meloxicam) ..... One by mouth daily    Oxycodone-acetaminophen 5-325  Mg Tabs (Oxycodone-acetaminophen) .Marland Kitchen... 1 by mouth up to 4 times per day as needed for pain  Orders: [facility]- Est  Level 4 (60454)  Complete Medication List: 1)  Adult Diapers  .... As needed.  disp enough for three times a day use for 1 month. 2)  Onetouch Test Strp (Glucose blood) .... Dispense quantity sufficient for twice daily blood glucose testing. 3)  Onetouch Delica Lancets Misc (Lancets) .... Dispense quantity sufficient for twice daily testing. 4)  Prozac 40 Mg Caps (Fluoxetine hcl) .... One daily 5)  Seroquel 200 Mg Tabs (Quetiapine fumarate) .... 1/2 by mouth q hs 6)  Ambien 10 Mg Tabs (Zolpidem tartrate) .... One by mouth q hs 7)  Nexium 40 Mg Cpdr (Esomeprazole magnesium) .... Take 1 tab each morning 8)  Lisinopril-hydrochlorothiazide 20-25 Mg Tabs (Lisinopril-hydrochlorothiazide) .... One daily 9)  Meloxicam 15 Mg Tabs (Meloxicam) .... One by mouth daily 10)  Anusol-hc 2.5 % Crea (Hydrocortisone) .... Per box instructions 11)  Polyethylene Glycol 3350 Powd (Polyethylene glycol 3350) .Marland KitchenMarland Kitchen. 17 gm in 4-6 oz water and drink daily, qs 12)  Zofran 4 Mg Tabs (Ondansetron hcl) .Marland Kitchen.. 1 by mouth every 8 hours as needed for nausea 13)  Oxycodone-acetaminophen 5-325 Mg Tabs (  Oxycodone-acetaminophen) .Marland Kitchen.. 1 by mouth up to 4 times per day as needed for pain 14)  Glimepiride 1 Mg Tabs (Glimepiride) .Marland Kitchen.. 1 by mouth daily 15)  Zyrtec Allergy 10 Mg Tabs (Cetirizine hcl) .Marland Kitchen.. 1 once daily prn Prescriptions: ZYRTEC ALLERGY 10 MG  TABS (CETIRIZINE HCL) 1 once daily prn  #30 x 3   Entered and Authorized by:   Helane Rima DO   Signed by:   Helane Rima DO on 04/30/2010   Method used:   Electronically to        [facility]* (retail)       [address]       [address] Shipping/mail[address]       [place], [place]  [address]       Ph: [phone]       Fax: [phone]   RxID:   [id number] ZOFRAN 4 MG TABS (ONDANSETRON HCL) 1 by mouth every 8 hours as needed for nausea  #30  x 0   Entered and Authorized by:   Helane Rima DO   Signed by:   Helane Rima DO on 04/30/2010   Method used:   Print then Give to Patient   RxID:   [id number] OXYCODONE-ACETAMINOPHEN 5-325 MG TABS (OXYCODONE-ACETAMINOPHEN) 1 by mouth up to 4 times per day as needed for pain  #20 x 0   Entered and Authorized by:   Helane Rima DO   Signed by:   Helane Rima DO on 04/30/2010   Method used:   Print then Give to Patient   RxID:   [id number] SEROQUEL 200 MG TABS (QUETIAPINE FUMARATE) 1/2 by mouth Q HS  #30 x 0   Entered and Authorized by:   Helane Rima DO   Signed by:   Helane Rima DO on 04/30/2010   Method used:   Print then Give to Patient   RxID:   [id number]    Orders Added: 1)  [facility]- Est  Level 4 [63016]

## 2010-05-23 NOTE — Progress Notes (Signed)
Summary: UPDATE PROBLEM LIST   

## 2010-05-23 NOTE — Assessment & Plan Note (Signed)
Summary: BP Check/kf   Vital Signs:  Patient profile:   60 year old female Height:      63.5 inches Weight:      199 pounds BMI:     34.82 Temp:     99.0 degrees F oral Pulse rate:   89 / minute BP sitting:   165 / 74  (left arm) Cuff size:   large  Vitals Entered By: Tessie Fass CMA (February 06, 2010 2:37 PM) CC: F/U BP Is Patient Diabetic? Yes Pain Assessment Patient in pain? yes     Location: head Intensity: 9   Primary Care Provider:  Helane Rima DO  CC:  F/U BP.  History of Present Illness: 60 yo F; please see extensive previous notes for more details: Patient presented with her grand-daughter today.  1. Rash: IMPROVED. x several weeks, face, arms, back, intermittent. Several medications were adjusted/discontinued in order to evaluate etiology. Previously used OTC hydrocortisone cream on rash. Now using Dove soap, perfume and dye-free laundry detergent, and Aveeno cream. Still states - "I'm burning from the inside."  2. Anxiety: SEVERE but IMPROVED with Seroquel 50 mg q hs. Sleeping better. Patient crying less during this visit than last week. Still focused on her anger and depression/anxiety surrounding her workman's comp and PT - but talks about it less. She denies SI/HI.   3. HA: Chronic. Stopped taking Topamax 2/2 thinking that it was contributing to her rash. Endorses pain at top of head, always there - "since they did that shot in my back," "I feel something swirling in there," "...I think that they used a dirty needle."  4. Back Pain: Chronic issue. Wants referral to Orthopedic physician. States that she thinks that there are "sores on the inside of my back from the injection."   5. DM: On no meds 2/2 did not tolerate Metformin (GI) and endorses decreased appetite with confirmed weight loss. High 253. Low 190.   6. Chronic Cystitis: With some dysuria today. No abdominal pain, fever/chills, hematuria. Previously followed by Urology.  Denies fever/chills,  CP, SOB, N/V/D, LE edema.    Habits & Providers  Alcohol-Tobacco-Diet     Tobacco Status: current     Tobacco Counseling: to quit use of tobacco products     Cigarette Packs/Day: 0.25  Current Medications (verified): 1)  Nystatin 100000 Unit/gm Powd (Nystatin) .... Apply To Lower Abdomen Two Times A Day As Needed. Disp 1 Bottle 2)  Adult Diapers .... As Needed.  Disp Enough For Three Times A Day Use For 1 Month. 3)  Onetouch Test  Strp (Glucose Blood) .... Dispense Quantity Sufficient For Twice Daily Blood Glucose Testing. 4)  Onetouch Delica Lancets  Misc (Lancets) .... Dispense Quantity Sufficient For Twice Daily Testing. 5)  Prozac 40 Mg Caps (Fluoxetine Hcl) .... One Daily 6)  Seroquel 50 Mg Tabs (Quetiapine Fumarate) .... 2 By Mouth Q Hs 7)  Ambien 10 Mg Tabs (Zolpidem Tartrate) .... One By Mouth Q Hs 8)  Omeprazole 20 Mg Cpdr (Omeprazole) .... One By Mouth Daily 9)  Lisinopril 20 Mg Tabs (Lisinopril) .Marland Kitchen.. 1 Tablet By Mouth Daily 10)  Meloxicam 15 Mg Tabs (Meloxicam) .... One By Mouth Daily  Allergies (verified): 1)  Pristiq (Desvenlafaxine Succinate) PMH-FH-SH reviewed for relevance  Social History: Packs/Day:  0.25  Review of Systems      See HPI  Physical Exam  General:  Very anxious AA female, shaking she is so upset. Vitals reviewed. Lungs:  Normal respiratory effort and normal breath  sounds.   Heart:  Normal rate and regular rhythm.   Pulses:  2+ bilateral DP pulses. Neurologic:  Cranial nerves II-XII intact.  Strength 4/5 all extremities - questionable effort.  Skin:  Few dry patches of skin on upper neck. No noticed rash on face or arms. Psych:  Not suicidal, severely anxious, easily distracted, poor concentration, and angry at times.   Impression & Recommendations:  Problem # 1:  DEPRESSION, MAJOR, WITH PSYCHOTIC BEHAVIOR (ICD-298.0) Assessment Improved  Improved slightly with Seroquel. Will increase dose today. Encouraged patient to see psychiatry.  Reviewed records.  Orders: [facility]- Est  Level 4 (04540)  Problem # 2:  CHRONIC PAIN SYNDROME (ICD-338.4) Assessment: Unchanged  Patient requesting pain control. Will refer to Pain Center. Advised patient to take Tylenol as needed for pain. However, she requests something stronger. Reviewed benefits and risks of adding NSAID in patient with HTN - she endorsed understanding and requested Rx. Note: also starting Lisinopril for HTN today. Will watch BMP and BP.  Orders: [facility]- Est  Level 4 (98119)  Problem # 3:  DIABETES MELLITUS, TYPE II (ICD-250.00) Assessment: Unchanged  As patient is not eating regularly (2/2 decreased appetite), will hold on adding medication as I do not want to cause hypoglycemia. BG 190-210. Her updated medication list for this problem includes:    Lisinopril 20 Mg Tabs (Lisinopril) .Marland Kitchen... 1 tablet by mouth daily  Orders: [facility]- Est  Level 4 (14782)  Problem # 4:  DYSURIA (ICD-788.1) Assessment: New UA negative. Patient with chronic cystitis - followed by Dr. Julien Girt. Rec f/u with him. Orders: Urinalysis-[facility] (00000) [facility]- Est  Level 4 (95621)  Problem # 5:  HYPERTENSION, BENIGN ESSENTIAL (ICD-401.1) Assessment: Unchanged  Her updated medication list for this problem includes:    Lisinopril 20 Mg Tabs (Lisinopril) .Marland Kitchen... 1 tablet by mouth daily  Orders: [facility]- Est  Level 4 (30865)  Complete Medication List: 1)  Nystatin 100000 Unit/gm Powd (Nystatin) .... Apply to lower abdomen two times a day as needed. disp 1 bottle 2)  Adult Diapers  .... As needed.  disp enough for three times a day use for 1 month. 3)  Onetouch Test Strp (Glucose blood) .... Dispense quantity sufficient for twice daily blood glucose testing. 4)  Onetouch Delica Lancets Misc (Lancets) .... Dispense quantity sufficient for twice daily testing. 5)  Prozac 40 Mg Caps (Fluoxetine hcl) .... One daily 6)  Seroquel 50 Mg Tabs (Quetiapine fumarate) .... 2 by mouth q hs 7)  Ambien 10 Mg Tabs (Zolpidem  tartrate) .... One by mouth q hs 8)  Omeprazole 20 Mg Cpdr (Omeprazole) .... One by mouth daily 9)  Lisinopril 20 Mg Tabs (Lisinopril) .Marland Kitchen.. 1 tablet by mouth daily 10)  Meloxicam 15 Mg Tabs (Meloxicam) .... One by mouth daily  Patient Instructions: 1)  Please follow up weekly with Luretha Murphy or Dr. Earlene Plater. 2)  Please take the Seroquel - two by mouth at night. 3)  I am adding lisinopril for your blood pressure and to protect your kidneys. 4)  I am giving Mobic for your pain - remember that this can increase your blood pressure. 5)  We are referring you to a Pain Specialist. Prescriptions: NYSTATIN 100000 UNIT/GM POWD (NYSTATIN) apply to lower abdomen two times a day as needed. Disp 1 bottle  #1 x 3   Entered and Authorized by:   Helane Rima DO   Signed by:   Helane Rima DO on 02/06/2010   Method used:   Electronically to  [facility] (retail)       [address]       [place], [place]  [id number]       Ph: [phone]       Fax: [phone]   RxID:   [id number] SEROQUEL 50 MG TABS (QUETIAPINE FUMARATE) 2 by mouth q hs  #60 x 0   Entered and Authorized by:   Helane Rima DO   Signed by:   Helane Rima DO on 02/06/2010   Method used:   Electronically to        [facility] [address] (retail)       [address]       [place], [place]  [id number]       Ph: [phone]       Fax: [phone]   RxID:   [id number] MELOXICAM 15 MG TABS (MELOXICAM) one by mouth daily  #30 x 3   Entered and Authorized by:   Helane Rima DO   Signed by:   Helane Rima DO on 02/06/2010   Method used:   Electronically to        [facility] [address] (retail)       [address]       [place], [place]  [id number]       Ph: [phone]       Fax: [phone]   RxID:   [id number] LISINOPRIL 20 MG TABS (LISINOPRIL) 1 tablet by mouth daily  #90 x 3   Entered and Authorized by:   Helane Rima DO   Signed by:   Helane Rima DO on 02/06/2010   Method used:   Electronically to        [facility] [address] (retail)       [address]       [place], [place]  [id number]       Ph: [phone]       Fax: [phone]   RxID:   [id number] OMEPRAZOLE 20 MG CPDR (OMEPRAZOLE) one by mouth daily  #90 x 3   Entered and Authorized by:   Helane Rima DO   Signed by:   Helane Rima DO on 02/06/2010   Method used:   Historical   RxID:   [id number]    Orders Added: 1)  Urinalysis-[facility] [00000] 2)  [facility]- Est  Level 4 [67619]    Laboratory Results   Urine Tests  Date/Time Received: February 06, 2010 3:33 PM  Date/Time Reported: February 06, 2010 3:45 PM   Routine Urinalysis   Color: yellow Appearance: Clear Glucose: 100   (Normal Range: Negative) Bilirubin: negative   (Normal Range: Negative) Ketone: negative   (Normal Range: Negative) Spec. Gravity: 1.025   (Normal Range: 1.003-1.035) Blood: negative   (Normal Range: Negative) pH: 6.5   (Normal Range: 5.0-8.0) Protein: negative   (Normal Range: Negative) Urobilinogen: 1.0   (Normal Range: 0-1) Nitrite: negative   (Normal Range: Negative) Leukocyte Esterace: negative   (Normal Range: Negative)    Comments: .........Marland Kitchenbiochemical negative; microscopic not indicated ...............test performed by......Marland KitchenBonnie A. [place], MLS (ASCP)cm     Prevention & Chronic Care Immunizations   Influenza vaccine: Fluvax 3+  (01/24/2008)   Influenza vaccine due: 01/23/2009    Tetanus booster: 07/20/2005: Done.   Tetanus booster due: 07/21/2015    Pneumococcal vaccine: Not documented    H. zoster vaccine: Not documented  Colorectal Screening   Hemoccult: Not documented   Hemoccult action/deferral: Not indicated  (  09/14/2009)    Colonoscopy: Done.  (08/19/2005)   Colonoscopy due: 08/20/2015  Other Screening   Pap smear: normal  (08/10/2007)   Pap smear action/deferral: Not indicated S/P hysterectomy  (11/13/2008)   Pap  smear due: 08/09/2008    Mammogram: Done.  (07/21/1998)   Mammogram due: 07/21/1999    DXA bone density scan: Not documented   Smoking status: current  (02/06/2010)   Smoking cessation counseling: yes  (06/14/2008)  Diabetes Mellitus   HgbA1C: 8.7  (12/07/2009)    Eye exam: Not documented    Foot exam: yes  (12/07/2009)   High risk foot: Not documented   Foot care education: Not documented    Urine microalbumin/creatinine ratio: Not documented    Diabetes flowsheet reviewed?: Yes   Progress toward A1C goal: Unchanged  Lipids   Total Cholesterol: 200  (08/10/2007)   LDL: 91  (08/10/2007)   LDL Direct: Not documented   HDL: 51  (08/10/2007)   Triglycerides: 290  (08/10/2007)  Hypertension   Last Blood Pressure: 165 / 74  (02/06/2010)   Serum creatinine: 0.70  (01/28/2010)   Serum potassium 4.3  (01/28/2010)    Hypertension flowsheet reviewed?: Yes   Progress toward BP goal: Improved  Self-Management Support :   Personal Goals (by the next clinic visit) :     Personal A1C goal: 8  (09/14/2009)     Personal blood pressure goal: 130/80  (09/14/2009)     Personal LDL goal: 100  (09/14/2009)    Patient will work on the following items until the next clinic visit to reach self-care goals:     Medications and monitoring: take my medicines every day, bring all of my medications to every visit  (02/06/2010)     Eating: drink diet soda or water instead of juice or soda, eat more vegetables, use fresh or frozen vegetables, eat foods that are low in salt, eat baked foods instead of fried foods, eat fruit for snacks and desserts, limit or avoid alcohol  (02/06/2010)     Activity: take a 30 minute walk every day, take the stairs instead of the elevator, park at the far end of the parking lot  (02/06/2010)    Diabetes self-management support: Written self-care plan  (02/06/2010)   Diabetes care plan printed    Diabetes self-management support not done because: Not indicated   (09/14/2009)    Hypertension self-management support: Written self-care plan  (02/06/2010)   Hypertension self-care plan printed.    Hypertension self-management support not done because: Good outcomes  (09/14/2009)

## 2010-05-23 NOTE — Consult Note (Signed)
Summary: Psych Eval  Psych Eval   Imported By: De Nurse 09/07/2009 16:02:53  _____________________________________________________________________  External Attachment:    Type:   Image     Comment:   External Document

## 2010-05-23 NOTE — Progress Notes (Signed)
  Phone Note Call from Patient   Caller: Patient Call For: [phone] Summary of Call: Pt is having severe abd pain.  Medicine for reflux not working Initial call taken by: Abundio Miu,  March 27, 2010 3:57 PM  Follow-up for Phone Call        patient states abdominal  pain is worse today. she has taken the nexium  and promethazine . she is also a little nauseated today. will forward message to MD to please advise.  Follow-up by: Theresia Lo RN,  March 27, 2010 4:47 PM  Additional Follow-up for Phone Call Additional follow up Details #1::        consulted Dr.  Earlene Plater and she states patient needs to go to ED for evaluation. patient notified. she is not sure she can get anyone to take her. advised her to call EMS if she has no other way to get to ED. she voices understanding. Additional Follow-up by: Theresia Lo RN,  March 27, 2010 4:50 PM     Appended Document:  Lipase high on recent labs. Pain MAY be pancreas.

## 2010-05-23 NOTE — Letter (Signed)
Summary: Results Follow-up Letter  [facility]  [address]   [place], [place] [address]   Phone: [phone]  Fax: [phone]    10/[birth year]2006  [address] [place], [place]  [address]  Dear Ms. Neu,   The following are the results of your recent test(s):  _________________________________________________________ Other Tests: Normal blood wotk.  Hemoglobin 12.3.  You are not anemic.  Normal kidney function and glucose.  Normal thyroid test.    Sincerely,  MAKEECHA Corliss Marcus MD [facility]           Appended Document: Results Follow-up Letter letter sent  admin team

## 2010-05-23 NOTE — Miscellaneous (Signed)
Summary: triage call  Clinical Lists Changes  pt reports problem with vertigo. started yesreday. today feels weak, nauseated, swimmyheadedness not as bad as last night. she is out of meclazine that she usually takes for these episodes.also has a  boil at top of rectum. appointment scheduled tomorrow am.

## 2010-05-23 NOTE — Assessment & Plan Note (Signed)
Summary: routine visit/eo   Vital Signs:  Patient profile:   60 year old female Height:      63.5 inches Weight:      194 pounds BMI:     33.95 Temp:     98.7 degrees F oral BP sitting:   142 / 70  (left arm) Cuff size:   large  Vitals Entered By: Tessie Fass CMA (May 09, 2010 9:11 AM) CC: F/U Is Patient Diabetic? Yes Pain Assessment Patient in pain? yes     Location: head Intensity: 8   Primary Care Provider:  Helane Rima DO  CC:  F/U.  History of Present Illness: 60 yo F:  1. DMII: Uncontrolled. Did not tolerate Metformin. I have been hesitant to add sulfonylurea as patient has been loosing weight (2/2 decreased appetite) consistently and I wanted to avoid hypOglycemia. However, patient consistently taking BG and was 300s, so I added Glimiperide at the previous visit. Patient states that BG improved to 200-220. Patient continues to lose weight, however. She doesn't like the "taste the pill leaves in my mouth" but continues to take it. Now states that she is taking Zofran around the clock because of the nausea caused by Glimiperide.  2. Pain: Chronic. Back pain since sink fell on her while working. See previous notes for this. She also c/o body aches, intermittent radicular pain through all limbs, intermittent n/t in extremities. No falls or new injuries. Sometimes feels that the pain is "deep in my bones." States that she feels that there is "some type of damage in my body." She was seen by Pain after injury and did well until her 3rd injection - "it leaked into my system and almost paralyzed me."   3. Psych: Ran out of Seroquel > 1 month ago and still has not restarted. Being followed by Psych in [place]. 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 No SI/HI.  4. Abdominal Pain: Continues (see previous OV for details). Patient needs to make appointment to see GI (see Arlyss Repress notes for more details).   5. Social: Patient has new attorney: Marita [place], [phone]. Patient spoke  for > 20 minutes, re: her anger 2/2 the way that she felt treated after her work injury, starting from feeling that nobody came to her aid during the accident and extending to her rehab in [place].   Current Medications (verified): 1)  Adult Diapers .... As Needed.  Disp Enough For Three Times A Day Use For 1 Month. 2)  Onetouch Test  Strp (Glucose Blood) .... Dispense Quantity Sufficient For Twice Daily Blood Glucose Testing. 3)  Onetouch Delica Lancets  Misc (Lancets) .... Dispense Quantity Sufficient For Twice Daily Testing. 4)  Prozac 40 Mg Caps (Fluoxetine Hcl) .... One Daily 5)  Seroquel 200 Mg Tabs (Quetiapine Fumarate) .... 1/2 By Mouth Q Hs 6)  Ambien 10 Mg Tabs (Zolpidem Tartrate) .... One By Mouth Q Hs 7)  Nexium 40 Mg Cpdr (Esomeprazole Magnesium) .... Take 1 Tab Each Morning 8)  Lisinopril 40 Mg Tabs (Lisinopril) .Marland Kitchen.. 1 By Mouth Once Daily 9)  Meloxicam 15 Mg Tabs (Meloxicam) .... One By Mouth Daily 10)  Anusol-Hc 2.5 % Crea (Hydrocortisone) .... Per Box Instructions 11)  Polyethylene Glycol 3350  Powd (Polyethylene Glycol 3350) .Marland KitchenMarland KitchenMarland Kitchen 17 Gm in 4-6 Oz Water and Drink Daily, Qs 12)  Zofran 4 Mg Tabs (Ondansetron Hcl) .Marland Kitchen.. 1 By Mouth Every 8 Hours As Needed For Nausea 13)  Glimepiride 1 Mg Tabs (Glimepiride) .Marland Kitchen.. 1 By Mouth Daily 14)  Zyrtec  Allergy 10 Mg  Tabs (Cetirizine Hcl) .Marland Kitchen.. 1 Once Daily Prn 15)  Artificial Tears  Soln (Artificial Tear Solution) .Marland Kitchen.. 1 Gtt in Each Eye As Needed For Dry Eye 16)  Hydrochlorothiazide 25 Mg  Tabs (Hydrochlorothiazide) .... Take 1 Tab By Mouth Every Morning  Allergies (verified): 1)  Pristiq (Desvenlafaxine Succinate) PMH-FH-SH reviewed for relevance  Review of Systems General:  Complains of fatigue, loss of appetite, and weight loss; denies chills and fever. CV:  Denies chest pain or discomfort, palpitations, shortness of breath with exertion, and swelling of feet. Resp:  Denies cough. GI:  Complains of abdominal pain and nausea; denies  constipation, diarrhea, and vomiting. MS:  Complains of joint pain, low back pain, mid back pain, and muscle aches; denies joint redness and joint swelling. Derm:  Denies rash. Psych:  Complains of anxiety and depression; denies suicidal thoughts/plans and thoughts /plans of harming others.  Physical Exam  General:  Well-developed, well-nourished, in no acute distress; alert, appropriate and cooperative throughout examination. Vitlas reviewed. Lungs:  Lungs clear to auscultation bilaterally.  Heart:  II/VI systolic murmur.  Abdomen:  Soft, normal bowel sounds, no masses, no guarding, and no rebound tenderness.  Generalized TTP. Pulses:  2+ bilateral DP pulses. Extremities:  No edema.   Impression & Recommendations:  Problem # 1:  DIABETES MELLITUS, TYPE II (ICD-250.00) Assessment Unchanged Sending to Pharmacy Clinic for help in finding a medication that Vickii can tolerate, also taking into consideration her continued weight loss.  Her updated medication list for this problem includes:    Lisinopril 40 Mg Tabs (Lisinopril) .Marland Kitchen... 1 by mouth once daily    Glimepiride 1 Mg Tabs (Glimepiride) .Marland Kitchen... 1 by mouth daily  Orders: [facility]- Est  Level 4 (19147)  Problem # 2:  CHRONIC PAIN SYNDROME (ICD-338.4) Assessment: Unchanged No change. Completed inpatient rehab. Obtained and reviewed records. Will scan. Patient with s/s c/w fibromyalgia. Patient needs to be followed by Pain Clinic.  Orders: [facility]- Est  Level 4 (99214)  Problem # 3:  DEPRESSION, MAJOR, WITH PSYCHOTIC BEHAVIOR (ICD-298.0) Assessment: Unchanged Severe. See HPI. Need to contact/review records from patient's Psychiatrist. Advised patient to restart Seroquel as this seemed to help with mood, sleep, and appetite. Orders: [facility]- Est  Level 4 (82956)  Complete Medication List: 1)  Adult Diapers  .... As needed.  disp enough for three times a day use for 1 month. 2)  Onetouch Test Strp (Glucose blood) .... Dispense quantity  sufficient for twice daily blood glucose testing. 3)  Onetouch Delica Lancets Misc (Lancets) .... Dispense quantity sufficient for twice daily testing. 4)  Prozac 40 Mg Caps (Fluoxetine hcl) .... One daily 5)  Seroquel 200 Mg Tabs (Quetiapine fumarate) .... 1/2 by mouth q hs 6)  Ambien 10 Mg Tabs (Zolpidem tartrate) .... One by mouth q hs 7)  Nexium 40 Mg Cpdr (Esomeprazole magnesium) .... Take 1 tab each morning 8)  Lisinopril 40 Mg Tabs (Lisinopril) .Marland Kitchen.. 1 by mouth once daily 9)  Meloxicam 15 Mg Tabs (Meloxicam) .... One by mouth daily 10)  Anusol-hc 2.5 % Crea (Hydrocortisone) .... Per box instructions 11)  Polyethylene Glycol 3350 Powd (Polyethylene glycol 3350) .Marland KitchenMarland Kitchen. 17 gm in 4-6 oz water and drink daily, qs 12)  Zofran 4 Mg Tabs (Ondansetron hcl) .Marland Kitchen.. 1 by mouth every 8 hours as needed for nausea 13)  Glimepiride 1 Mg Tabs (Glimepiride) .Marland Kitchen.. 1 by mouth daily 14)  Zyrtec Allergy 10 Mg Tabs (Cetirizine hcl) .Marland Kitchen.. 1 once daily prn 15)  Artificial Tears Soln (Artificial tear solution) .Marland Kitchen.. 1 gtt in each eye as needed for dry eye 16)  Hydrochlorothiazide 25 Mg Tabs (Hydrochlorothiazide) .... Take 1 tab by mouth every morning  Other Orders: Comp Met-[facility] ([id number])  Patient Instructions: 1)  It was nice to see you today! 2)  MAKE AN APPOINTMENT WITH THE PHARMACY CLINIC. 3)  Follow up in 1-2 weeks. Prescriptions: HYDROCHLOROTHIAZIDE 25 MG  TABS (HYDROCHLOROTHIAZIDE) Take 1 tab by mouth every morning  #90 x 3   Entered and Authorized by:   Helane Rima DO   Signed by:   Helane Rima DO on 05/09/2010   Method used:   Electronically to        [facility]* (retail)       [address]       [address] Shipping/mailing       [place], [place]  [address]       Ph: [phone]       Fax: [phone]   RxID:   [id number] LISINOPRIL 40 MG TABS (LISINOPRIL) 1 by mouth once daily  #90 x 3   Entered and Authorized by:   Helane Rima DO   Signed by:   Helane Rima  DO on 05/09/2010   Method used:   Electronically to        [facility]* (retail)       [address]       [address] Shipping/mailing       [place], [place]  [address]       Ph: [phone]       Fax: [phone]   RxID:   [id number] NEXIUM 40 MG CPDR (ESOMEPRAZOLE MAGNESIUM) Take 1 tab each morning  #30 x 2   Entered and Authorized by:   Helane Rima DO   Signed by:   Helane Rima DO on 05/09/2010   Method used:   Electronically to        [facility]* (retail)       [address]       [address] Shipping/mailing       [place], [place]  [address]       Ph: [phone]       Fax: [phone]   RxID:   [id number] LISINOPRIL-HYDROCHLOROTHIAZIDE 20-25 MG TABS (LISINOPRIL-HYDROCHLOROTHIAZIDE) one daily  #90 x 1   Entered and Authorized by:   Helane Rima DO   Signed by:   Helane Rima DO on 05/09/2010   Method used:   Electronically to        [facility]* (retail)       [address]       [address] Shipping/mailing       [place], [place]  [address]       Ph: [phone]       Fax: [phone]   RxID:   [id number] NYSTATIN 100000 UNIT/GM CREA (NYSTATIN) apply to affected area two times a day as needed  for rash  #1 x 3   Entered and Authorized by:   Helane Rima DO   Signed by:   Helane Rima DO on 05/09/2010   Method used:   Electronically to        [facility]* (retail)       [address]       [address] Shipping/mailing       [place], [place]  [address]  Ph: [phone]       Fax: [phone]   RxID:   [id number] ARTIFICIAL TEARS  SOLN (ARTIFICIAL TEAR SOLUTION) 1 gtt in each eye as needed for dry eye  #1 x 3   Entered and Authorized by:   Helane Rima DO   Signed by:   Helane Rima DO on 05/09/2010   Method used:   Electronically to        [facility]* (retail)       [address]       [address] Shipping/mailing        [place], [place]  [address]       Ph: [phone]       Fax: [phone]   RxID:   [id number]    Orders Added: 1)  Comp Met-[facility] [[id number]] 2)  [facility]- Est  Level 4 [[address]]

## 2010-05-23 NOTE — Assessment & Plan Note (Signed)
Summary: F/U STOMACH PROBLEMS, DF   Vital Signs:  Patient Profile:   60 Years Old Female Height:     63.5 inches (162.56 cm) Weight:      190 pounds Temp:     98.1 degrees F Pulse rate:   87 / minute BP sitting:   125 / 86  Pt. in pain?   yes    Location:   stomach    Intensity:   10  Vitals Entered By: Jone Baseman CMA (February 11, 2008 9:26 AM)                   PCP:  Ruthe Mannan MD  Chief Complaint:  f/u stomach.  History of Present Illness: 60 yo female with h/o multiple complaints who presents today with worsening GERD.  She is a very poor historian but says she cannot take her Prilosec or Pepcid anymore.   When asked why, she said that it doesn't make her feel right.  She could not elaborate.  She is interested in seeing a GI doctor.  She says that her reflux feels worse than ever.  She feels it all the time, not just after eating or when lying down.  Her appetitie has been ok.  She denies any nausea, vomitting, or change in bowel habits.    Current Allergies: No known allergies      Review of Systems      See HPI   Physical Exam  General:     Well-developed,well-nourished,in no acute distress; alert,appropriate and cooperative throughout examination Lungs:     Normal respiratory effort, chest expands symmetrically. Lungs are clear to auscultation, no crackles or wheezes. Heart:     Distant S1 and S2.  No mumurs noted Abdomen:     Bowel sounds positive,abdomen soft and non-tender without masses, organomegaly or hernias noted.    Impression & Recommendations:  Problem # 1:  GASTROESOPHAGEAL REFLUX DISEASE, CHRONIC (ICD-530.81) Assessment: Deteriorated Will go ahead and refer to GI as she would like a second opinion and may benefit from repeat endoscopy.  Advised to continue Aciphex until seen by GI. Her updated medication list for this problem includes:    Aciphex 20 Mg Tbec (Rabeprazole sodium) .Marland Kitchen... 1 tab by mouth  daily  Orders: Gastroenterology Referral (GI) [facility]- Est Level  3 (98119)   Problem # 2:  COUGH (ICD-786.2) Assessment: Unchanged Persistent despite treatment for presumed CAP.  Will get CXR to evaluate for infectious process. Orders: CXR- 2view (CXR) [facility]- Est Level  3 (14782)   Complete Medication List: 1)  Dyazide 37.5-25 Mg Caps (Triamterene-hctz) .Marland Kitchen.. 1 tab by mouth daily 2)  Proventil Hfa 108 (90 Base) Mcg/act Aers (Albuterol sulfate) .Marland Kitchen.. 1-2 puffs q four hours as needed. 3)  Aciphex 20 Mg Tbec (Rabeprazole sodium) .Marland Kitchen.. 1 tab by mouth daily 4)  Ambien Cr 6.25 Mg Tbcr (Zolpidem tartrate) .... Take 1 tablet by mouth at bedtime 5)  Dyazide 37.5-25 Mg Caps (Triamterene-hctz) .... Take 1 capsule by mouth once a day 6)  Meclizine Hcl 12.5 Mg Tabs (Meclizine hcl) .... Take 1 tablet by mouth twice a day 7)  Detrol La 2 Mg Cp24 (Tolterodine tartrate) .Marland Kitchen.. 1 tablet by mouth daily 8)  Zithromax 250 Mg Tabs (Azithromycin) .... 500 mg po on day 1 followed by 250 mg/day as a single dose on days 2-5 dispense qs    ]

## 2010-05-23 NOTE — Letter (Signed)
Summary: Results Letter  [facility]  [address] [place], [place] [address]   Phone: [phone]  Fax: [phone]        March 01, 2008 MRN: [id number]    VERLEY PARISEAU [address] [place], [place]  [address]    Dear Ms. Venuto,  It is my pleasure to have treated you recently as a new patient in my office. I appreciate your confidence and the opportunity to participate in your care.  Since I do have a busy inpatient endoscopy schedule and office schedule, my office hours vary weekly. I am, however, available for emergency calls everyday through my office. If I am not available for an urgent office appointment, another one of our gastroenterologist will be able to assist you.  My well-trained staff are prepared to help you at all times. For emergencies after office hours, a physician from our Gastroenterology section is always available through my 24 hour answering service  Once again I welcome you as a new patient and I look forward to a happy and healthy relationship             Sincerely,  Louis Meckel MD  This letter has been electronically signed by your physician.  Appended Document: Results Letter letter mailed

## 2010-05-23 NOTE — Assessment & Plan Note (Signed)
Summary: cpe/pap,tcb   Vital Signs:  Patient profile:   60 year old female Height:      63.5 inches Weight:      193 pounds BMI:     33.77 Temp:     99.0 degrees F oral Pulse rate:   106 / minute BP sitting:   162 / 91  (left arm) Cuff size:   regular  Vitals Entered By: Garen Grams LPN (Aug 27, 1608 9:51 AM) CC: hfu Is Patient Diabetic? No Pain Assessment Patient in pain? yes     Location: back   Primary Care Provider:  Ancil Boozer  MD  CC:  hfu.  History of Present Illness: hospital follow up: hospitalized on 08/14/09 for weakness/falls after ESI injection performed by Dr Venetia Maxon.  Given blood patch in hospital and lyrica d/c'd.  Patient also then set up with home health PT.  since then she comes in tearful stating she is no better depite also stopping her cymbalta.  Shes tates she hurts in her neck, down her back, in her arms and legs and in her upper abdomen.  she reports continued urinary incontinence - she reports that she can get to the bathroom on time and urinate but then when she stands again she will urinate without control.  she states this was happening in the hospital as well.  she also reports rectal bleeding x2 episodes with reported "clots" 1 day prior to this visit.  she states she hasn't had any bloody BM's today so far.  she also reports pain like a ball in her LUQ.  Finally she states she feels like her brain is being pulled out of her body and that she isn't sleeping and continues to shake.    Habits & Providers  Alcohol-Tobacco-Diet     Tobacco Status: current     Tobacco Counseling: to quit use of tobacco products     Cigarette Packs/Day: 0.5  Current Medications (verified): 1)  Dyazide 37.5-25 Mg Caps (Triamterene-Hctz) .... Take 1 Capsule By Mouth Once A Day 2)  Clonazepam 1 Mg Tabs (Clonazepam) .Marland Kitchen.. 1-1.5 By Mouth Two Times A Day As Needed and 2 At Bedtime Per Outside Dr 3)  Bromday 0.09 % Soln (Bromfenac Sodium) .Marland Kitchen.. 1 Drop L Eye Daily 4)   Omeprazole 20 Mg Cpdr (Omeprazole) .... Two Times A Day 5)  Besivance 0.6 % Susp (Besifloxacin Hcl) .Marland Kitchen.. 1 Drop Left Three Times A Day 6)  Flonase 50 Mcg/act Susp (Fluticasone Propionate) .... 2 Sprays Each Nostril Daily 7)  Cetirizine Hcl 10 Mg Tabs (Cetirizine Hcl) .... Daily 8)  Prednisolone Acetate 1 % Susp (Prednisolone Acetate) .... Eye Drops 1 Drop Both Eyes Qid 9)  Nystatin 100000 Unit/gm Powd (Nystatin) .... Apply To Lower Abdomen Two Times A Day As Needed. Disp 1 Bottle  Allergies (verified): No Known Drug Allergies  Past History:  Past medical, surgical, family and social histories (including risk factors) reviewed for relevance to current acute and chronic problems.  Past Medical History: Reviewed history from 08/14/2009 and no changes required. CATARACT EXTRACTIONS, BILATERAL, HX OF (ICD-V45.61) CHRONIC PAIN SYNDROME (ICD-338.4) DEPRESSION, MAJOR, WITH PSYCHOTIC BEHAVIOR (ICD-298.0) TOBACCO USER (ICD-305.1) CHRONIC INTERSTITIAL CYSTITIS (ICD-595.1) GASTROESOPHAGEAL REFLUX DISEASE, CHRONIC (ICD-530.81) URINARY INCONTINENCE (ICD-788.30) Hx of BENIGN POSITIONAL VERTIGO (ICD-386.11) HYPERTENSION, BENIGN ESSENTIAL (ICD-401.1)  Past Surgical History: Reviewed history from 06/18/2006 and no changes required. Cholecystectomy - 04/21/1997, tah and single oophorectomy--fibroids - 04/22/1991  Family History: Reviewed history from 08/14/2009 and no changes required. cad--daugher--mi age 59, dm--sister  Social History: Reviewed history from 08/14/2009 and no changes required. Lives with daughter. .; Smokes 1/2 pk cig/day for 20+ years.; No alcohol or drugs  Review of Systems       per  HPI.  denies fevers but does note poor appetite  Physical Exam  General:  alert, obese AAF in NAD but upset and tearful Head:  normocephalic and atraumatic.   Eyes:  vision grossly intact, pupils equal, pupils round, pupils reactive to light, and no injection.  EOMI Ears:  R ear normal, L ear  normal, and no external deformities.   Nose:  External nasal examination shows no deformity or inflammation. Nasal mucosa are pink and moist without lesions or exudates. Mouth:  MMM Neck:  No deformities, masses, or tenderness noted. Lungs:  Normal respiratory effort, chest expands symmetrically. Lungs are clear to auscultation, no crackles or wheezes. Heart:  Distant S1 and S2.  No mumurs noted Abdomen:  + BS.  mildly tender LUQ with ? hernia but easily reducible.  soft.  no rebound or guarding.  nondistended.   Neurologic:  alert & oriented X3 and cranial nerves II-XII intact.  strength 5-/5 all extremities.  very tremulousness worsened with movement.  sensation intact.  DTRs remain symmectrical and normal. finger to nose slow and difficult with tremor bilaterally.  negative rhomberg. Psych:  Oriented X3.  anxious appearing, easily angered with labile affect   Impression & Recommendations:  Problem # 1:  UNSTEADY GAIT (ICD-781.2) Assessment Unchanged  and weakness. appears unchanged since hospital admission.  patient reports follow up tomorrow with Dr Venetia Maxon.  Since no acute changes on examination today from when she was discharged from hospital no acute indication for readmission. I suspect a lot of this is psychiatric in nature.     Precepted with Dr Corinna Gab  Orders: [facility]- Est  Level 4 (81191)  Problem # 2:  RECTAL BLEEDING (ICD-569.3) Assessment: New unclear of how much but appears well profused today.  check CBC to see if stable from previous check in hospital.   in review of old charts it appears she has had this before.  Orders: [facility] (47829) [facility]- Est  Level 4 (56213)  Problem # 3:  DEPRESSION, MAJOR, WITH PSYCHOTIC BEHAVIOR (ICD-298.0) Assessment: Deteriorated  patient self stopped her cymbalta as well which she says was a recommendation from her psychatrist.  she reports having follow up soon with psych though she states she doesn't want to go.  i have encouraged her  to attend this appt.   Orders: [facility]- Est  Level 4 (08657)  Problem # 4:  HYPERTENSION, BENIGN ESSENTIAL (ICD-401.1) Assessment: Deteriorated  elevated - I question complaince.  she brought in a large number of empty med bottles including a bottle of omeprazole with a different person's name on it.  she reports having taken her BP med this am.  given how anxious she is today will defer changing her med until other problems settle down.  Her updated medication list for this problem includes:    Dyazide 37.5-25 Mg Caps (Triamterene-hctz) .Marland Kitchen... Take 1 capsule by mouth once a day  Orders: [facility]- Est  Level 4 (84696)  Complete Medication List: 1)  Dyazide 37.5-25 Mg Caps (Triamterene-hctz) .... Take 1 capsule by mouth once a day 2)  Clonazepam 1 Mg Tabs (Clonazepam) .Marland Kitchen.. 1-1.5 by mouth two times a day as needed and 2 at bedtime per outside dr 3)  Bromday 0.09 % Soln (Bromfenac sodium) .Marland Kitchen.. 1 drop l eye daily 4)  Omeprazole 20 Mg Cpdr (Omeprazole) .... Two times a day 5)  Besivance 0.6 % Susp (Besifloxacin hcl) .Marland Kitchen.. 1 drop left three times a day 6)  Flonase 50 Mcg/act Susp (Fluticasone propionate) .... 2 sprays each nostril daily 7)  Cetirizine Hcl 10 Mg Tabs (Cetirizine hcl) .... Daily 8)  Prednisolone Acetate 1 % Susp (Prednisolone acetate) .... Eye drops 1 drop both eyes qid 9)  Nystatin 100000 Unit/gm Powd (Nystatin) .... Apply to lower abdomen two times a day as needed. disp 1 bottle  Patient Instructions: 1)  be sure to keep all of your doctors appointments as scheduled and make sure they send reports of their findings to me at the family practice center. 2)  Try the powder for your lower abdomen 3)  Try a heating pad for your upper abdomen. 4)  If the bleeding from your rectum becomes more or doesn't stop seek help. Prescriptions: NYSTATIN 100000 UNIT/GM POWD (NYSTATIN) apply to lower abdomen two times a day as needed. Disp 1 bottle  #1 x 3   Entered and Authorized by:   Ancil Boozer   MD   Signed by:   Ancil Boozer  MD on 08/27/2009   Method used:   Electronically to        [id number] (retail)       [address]       [place], [place]  [id number]       Ph: [phone]       Fax: [phone]   RxID:   [id number]

## 2010-05-23 NOTE — Assessment & Plan Note (Signed)
Summary: problems w/stomach & legs/eo   Vital Signs:  Patient Profile:   60 Years Old Female Height:     63.5 inches (162.56 cm) Weight:      195 pounds Temp:     98.1 degrees F Pulse rate:   71 / minute BP sitting:   131 / 71  Pt. in pain?   yes    Location:   stomach    Intensity:   8  Vitals Entered By: Jone Baseman CMA (November 24, 2007 8:41 AM)                 Last PAP:  Done. (08/19/2005 12:00:00 AM) PAP Result Date:  08/10/2007 PAP Result:  normal PAP Next Due:  1 yr   PCP:  Ruthe Mannan MD  Chief Complaint:  problems with stomach and legs.  History of Present Illness: 60 yo with multiple medical probs who presents with multiple complaints.  Most acutely, she is complaining of increased uringary frequency and dysuria for years.  Denies any flank pain, nausea, vomitting, or fevers.  She is also complaining of ear pain bilaterally x 1 month.  1.  Urinary symptoms- very common complaint for Ms. Hiers as she has known urge incontinence and is followed by Dr. Wanda Plump.  she has chronic microhematuria, urge incontinence and recurrent cystitis.  Per Dr. Wanda Plump notes, she has tried Detrol and Vesicare but always stopped taking them within a month as they seem to work for only a limited time.  Today, she states that she would like to try medication again.  Denies ay hematuria but she thinks there is pus in her urine.  2.  HTN-has been well controlled ranging mainly in 120s/70s at home.  3.  Ear pain- states that she has had ear pain x 1 month bilaterally.  Denies any ringing, pressure, or decreased hearing.    Current Allergies: No known allergies    Social History:    Reviewed history from 06/18/2006 and no changes required:       Lives with daughter.  Works at [employer] or.; Smokes 5-6 cig/day for 20 years.; No alcohol or drugs   Risk Factors:     Counseled to quit/cut down tobacco use:  yes  PAP Smear History:     Date of Last PAP Smear:  08/10/2007    PAP Smear History:     Date of Last PAP Smear:  08/10/2007    Results:  normal    Review of Systems      See HPI   Physical Exam  General:     Well-developed,well-nourished,in no acute distress; alert,appropriate and cooperative throughout examination Ears:     External ear exam shows no significant lesions or deformities.  Otoscopic examination reveals clear canals, tympanic membranes are intact bilaterally without bulging, retraction, inflammation or discharge. Hearing is grossly normal bilaterally. Abdomen:     Bowel sounds positive,abdomen soft and non-tender without masses, organomegaly or hernias noted. Psych:     flat affect, very circular speech pattern    Impression & Recommendations:  Problem # 1:  FREQUENCY, URINARY (ICD-788.41) Assessment: Unchanged UA neg for infection.   This is likely secondary to her chronic urge incontinence.  She would like to retry medications.  Will restart Detrol.  She has a follow up with [facility] in 3 months.  She wanted me to look in bladder today, advised her that I do not perform cystoscopies but that her urologist would only do that if he  deemed it necessary. Her updated medication list for this problem includes:    Detrol La 2 Mg Cp24 (Tolterodine tartrate) .Marland Kitchen... 1 tablet by mouth daily   Problem # 2:  HYPERTENSION, BENIGN ESSENTIAL (ICD-401.1) Assessment: Unchanged Controlled.  Continue current meds.     Dyazide 37.5-25 Mg Caps (Triamterene-hctz) .Marland Kitchen... Take 1 capsule by mouth once a day  Her updated medication list for this problem includes:    Dyazide 37.5-25 Mg Caps (Triamterene-hctz) .Marland Kitchen... 1 tab by mouth daily    Dyazide 37.5-25 Mg Caps (Triamterene-hctz) .Marland Kitchen... Take 1 capsule by mouth once a day  Orders: [facility]- Est  Level 4 (99214)   Problem # 3:  EAR PAIN, BILATERAL (ICD-388.70) Assessment: New No signs of infection.  Reassurance provided. Orders: [facility]- Est  Level 4 (13086)   Complete Medication List: 1)   Dyazide 37.5-25 Mg Caps (Triamterene-hctz) .Marland Kitchen.. 1 tab by mouth daily 2)  Proventil Hfa 108 (90 Base) Mcg/act Aers (Albuterol sulfate) .Marland Kitchen.. 1-2 puffs q four hours as needed. 3)  Aciphex 20 Mg Tbec (Rabeprazole sodium) .Marland Kitchen.. 1 tab by mouth daily 4)  Ambien Cr 6.25 Mg Tbcr (Zolpidem tartrate) .... Take 1 tablet by mouth at bedtime 5)  Dyazide 37.5-25 Mg Caps (Triamterene-hctz) .... Take 1 capsule by mouth once a day 6)  Meclizine Hcl 12.5 Mg Tabs (Meclizine hcl) .... Take 1 tablet by mouth twice a day 7)  Detrol La 2 Mg Cp24 (Tolterodine tartrate) .Marland Kitchen.. 1 tablet by mouth daily  Other Orders: Urinalysis-[facility] (00000)   Patient Instructions: 1)  Follow up with me in 6 months. 2)  Keep your appointment with Dr. Wanda Plump.   Prescriptions: DETROL LA 2 MG  CP24 (TOLTERODINE TARTRATE) 1 tablet by mouth daily  #90 x 3   Entered and Authorized by:   Ruthe Mannan MD   Signed by:   Ruthe Mannan MD on 11/24/2007   Method used:   Electronically sent to ...       [id number]       [address]       [place], [place]  [address]       Ph: [phone] or [phone]       Fax: [phone]   RxID:   [id number] DYAZIDE 37.5-25 MG CAPS (TRIAMTERENE-HCTZ) 1 tab by mouth daily  #30 x 6   Entered and Authorized by:   Ruthe Mannan MD   Signed by:   Ruthe Mannan MD on 11/24/2007   Method used:   Electronically sent to ...       [id number]       [address]       [place], [place]  [address]       Ph: [phone] or [phone]       Fax: [phone]   RxID:   [id number]  ] Laboratory Results   Urine Tests  Date/Time Received: November 24, 2007 8:51 AM  Date/Time Reported: November 24, 2007 9:11 AM   Routine Urinalysis   Color: yellow Appearance: Clear Glucose: negative   (Normal Range: Negative) Bilirubin: negative   (Normal Range: Negative) Ketone: negative   (Normal Range:  Negative) Spec. Gravity: 1.020   (Normal Range: 1.003-1.035) Blood: negative   (Normal Range: Negative) pH: 6.5   (Normal Range: 5.0-8.0) Protein: negative   (Normal Range: Negative) Urobilinogen:  0.2   (Normal Range: 0-1) Nitrite: negative   (Normal Range: Negative) Leukocyte Esterace: negative   (Normal Range: Negative)    Comments: ...............test performed by......Marland KitchenBonnie A. [place], [place] (ASCP)

## 2010-05-23 NOTE — Progress Notes (Signed)
Summary: phnmsg  Phone Note Call from Patient Call back at Home Phone [phone]   Caller: Dara Lords Summary of Call: wants to talk to dr Mckaylie Vasey about meds change Initial call taken by: De Nurse,  Aug 31, 2009 12:06 PM  Follow-up for Phone Call        Dr. Jeannine Kitten  & Gennie Alma are pain specialists in [facility]. gave her alprazolam 1mg  three times a day x 4 days then then bid  x 4 day. then 1 a day. states it has helped. also on hydromorphone 4mg  three times a day x 4 days then one tab two times a day. topamate 25mg  one two times a day x 2 days then 1 three times a day x 4 days then 2 tabs three times a day daily. son just wanted pcp to know.  still has a problem sometimes with L leg.  he wants Dr. Sandi Mealy to call him back Follow-up by: Golden Circle RN,  Aug 31, 2009 12:09 PM  Additional Follow-up for Phone Call Additional follow up Details #1::        returning call to son Aurther Loft at his request.    he states that Maisa is doing slightly better.  Roslyn herself states that as long as she takes the xanax only at night.  still having headaches on left side.    She states she wants a visit with a specialist at [facility] for back/brain.  Advised we need to do more work here before we would even consider this.  She reports she has an appt with Dr Venetia Maxon next week and I advised she let him know her concerns.   Brittinie reports some vomiting still but taking Ensure.  Having some trouble getting her home health nurses and aides showing up. She has reported this to [facility].  Finally I have advised that she make an appt with me sometime during the first week or so of june to regroup and see how things are doing.  she states she will call back to make the appt. Additional Follow-up by: Ancil Boozer  MD,  Sep 03, 2009 3:13 PM    New/Updated Medications: ALPRAZOLAM 1 MG TABS (ALPRAZOLAM) by mouth at bedtime per outside doc HYDROMORPHONE HCL 4 MG TABS (HYDROMORPHONE  HCL) two times a day per pain clinic TOPIRAMATE 25 MG TABS (TOPIRAMATE) 2 tabs three times a day per outside clinic

## 2010-05-23 NOTE — Assessment & Plan Note (Signed)
Summary: cpp wp   Vital Signs:  Patient Profile:   60 Years Old Female Height:     63.5 inches (162.56 cm) Weight:      191 pounds Temp:     98.7 degrees F Pulse rate:   87 / minute BP sitting:   121 / 77  Pt. in pain?   yes    Location:   all over  Vitals Entered By: Jone Baseman CMA (August 10, 2007 8:32 AM)                  PCP:  Ruthe Mannan MD  Chief Complaint:  CPP.  History of Present Illness: 60 year old female with h/o HTN who presents for CPP/PAP with multiple complaints.  Most acutely, she is complaining of increased urinary frequency and suprapubic pain.  She denies any flank pain, nausea, vomitting, or fevers.  She is also complaining of increased nasal congestion and ear pain x 6 months.  1.  Urinary symptoms- sees a urologist, Dr. Wanda Plump, for chronic microhematuria, urge incontinence, and recurrent cystitis.  Last OV with him was 3 months ago.  She is not taking anything at this time for above issues.  She is very insistent that we check her bladder today.  She states she has had increased urinary frequency over the last 3 months.  She states that she often cannot make it to the bathroom, and she does sometimes does she blood in her urine.  She denies any dysuria.  she states she does not want to try any meds like Detrol, Vesicarfe, or Enablex as she has tried them all in the past.  2.  Nasal congestion- has not taken anything for it.  Does have a h/o allergies.  Denies any fevers, cough, or other symptoms  3.  HTN- well controlled.  Raning in 120s/70-80s at home.  4.  PAP- no h/o abnormal pap in past.  Had hysterectomy for fibroids 15 years ago.  She is unsure if she still has her cervix.  Is not currently sexually active.    Updated Prior Medication List: DYAZIDE 37.5-25 MG CAPS (TRIAMTERENE-HCTZ) 1 tab by mouth daily PROVENTIL HFA 108 (90 BASE) MCG/ACT  AERS (ALBUTEROL SULFATE) 1-2 puffs q four hours as needed. ACIPHEX 20 MG  TBEC (RABEPRAZOLE SODIUM) 1  tab by mouth daily AMBIEN CR 6.25 MG TBCR (ZOLPIDEM TARTRATE) Take 1 tablet by mouth at bedtime DYAZIDE 37.5-25 MG CAPS (TRIAMTERENE-HCTZ) Take 1 capsule by mouth once a day MECLIZINE HCL 12.5 MG TABS (MECLIZINE HCL) Take 1 tablet by mouth twice a day  Current Allergies (reviewed today): No known allergies   Past Medical History:    Reviewed history from 06/18/2006 and no changes required:       L. arm pit abcess--I & D 4/01  Past Surgical History:    Reviewed history from 06/18/2006 and no changes required:       Cholecystectomy - 04/21/1997, tah and single oophorectomy--fibroids - 04/22/1991   Family History:    Reviewed history from 06/18/2006 and no changes required:       cad--daugher--mi age 64, dm--sister, hpt-sister  Social History:    Reviewed history from 06/18/2006 and no changes required:       Lives with daughter.  Works at [employer] or.; Smokes 5-6 cig/day for 20 years.; No alcohol or drugs   Risk Factors:    Review of Systems      See HPI   Physical Exam  General:  anxious appearing Ears:     External ear exam shows no significant lesions or deformities.  Otoscopic examination reveals clear canals, tympanic membranes are intact bilaterally without bulging, retraction, inflammation or discharge. Hearing is grossly normal bilaterally. Lungs:     Normal respiratory effort, chest expands symmetrically. Lungs are clear to auscultation, no crackles or wheezes. Heart:     Distant S1 and S2.  No mumurs noted Genitalia:     Normal introitus for age, no external lesions, no vaginal discharge, mucosa pink and moist, no vaginal l lesions, no vaginal atrophy, no friaility or hemorrhage, no cervix or uterus. Msk:     No CVA tenderness.    Impression & Recommendations:  Problem # 1:  FREQUENCY, URINARY (ICD-788.41) Assessment: Deteriorated Most likely secondary to known urge incontinence of which she declines treatment.  I encouraged her to continue following  with urologist as she was insistent on [place] doing cystoscopy today.  I iformed her that I do not do cystoscopys.  I think she would benefit from an anticholinergic.  Will also check UA to r/o acute UTI.  Of note, she has chronic microhematuria.  Orders: Urinalysis-[facility] (00000) [facility]- Est  Level 4 (99214)   Problem # 2:  SCREENING FOR MALIGNANT NEOPLASM OF THE CERVIX (ICD-V76.2) She is very nervous about malignancy.  She does not have a cervix so I took scraping from vaginal wall. Orders: Pap Smear-[facility] ([id number]) [facility]- Est  Level 4 (09811)   Problem # 3:  HYPERTENSION, BENIGN ESSENTIAL (ICD-401.1) Assessment: Unchanged Controlled with current meds. Her updated medication list for this problem includes:    Dyazide 37.5-25 Mg Caps (Triamterene-hctz) .Marland Kitchen... 1 tab by mouth daily   Orders: Comp Met-[facility] ([id number]) Lipid-[facility] ([id number]) [facility]- Est  Level 4 ([address])  Her updated medication list for this problem includes:    Dyazide 37.5-25 Mg Caps (Triamterene-hctz) .Marland Kitchen... 1 tab by mouth daily    Dyazide 37.5-25 Mg Caps (Triamterene-hctz) .Marland Kitchen... Take 1 capsule by mouth once a day   Complete Medication List: 1)  Dyazide 37.5-25 Mg Caps (Triamterene-hctz) .Marland Kitchen.. 1 tab by mouth daily 2)  Proventil Hfa 108 (90 Base) Mcg/act Aers (Albuterol sulfate) .Marland Kitchen.. 1-2 puffs q four hours as needed. 3)  Aciphex 20 Mg Tbec (Rabeprazole sodium) .Marland Kitchen.. 1 tab by mouth daily 4)  Ambien Cr 6.25 Mg Tbcr (Zolpidem tartrate) .... Take 1 tablet by mouth at bedtime 5)  Dyazide 37.5-25 Mg Caps (Triamterene-hctz) .... Take 1 capsule by mouth once a day 6)  Meclizine Hcl 12.5 Mg Tabs (Meclizine hcl) .... Take 1 tablet by mouth twice a day   Patient Instructions: 1)  Try some Zyrtec or Claritin over the counter for allergies. 2)  Make an appointment for your mammogram. 3)  I will call you or send you a letter with results of today's tests.    ] Laboratory Results   Urine Tests  Date/Time Received: August 10, 2007  8:59 AM Date/Time Reported: August 10, 2007 9:21 AM  Routine Urinalysis   Color: yellow Appearance: Clear Glucose: negative   (Normal Range: Negative) Bilirubin: negative   (Normal Range: Negative) Ketone: negative   (Normal Range: Negative) Spec. Gravity: 1.020   (Normal Range: 1.003-1.035) Blood: negative   (Normal Range: Negative) pH: 7.0   (Normal Range: 5.0-8.0) Protein: negative   (Normal Range: Negative) Urobilinogen: 0.2   (Normal Range: 0-1) Nitrite: negative   (Normal Range: Negative) Leukocyte Esterace: negative   (Normal Range: Negative)    Comments: test performed by C. Mallory, [facility]  SMA ...................................................................DONNA [facility]  August 10, 2007 9:21 AM

## 2010-05-23 NOTE — Progress Notes (Signed)
Summary: Orders Needed  Phone Note Other Incoming Call back at [phone]   Caller: [facility] Summary of Call: Really needs a home health aid as well as the PT they are giving.  Would like to add this to the order. Initial call taken by: Clydell Hakim,  Aug 23, 2009 9:14 AM  Follow-up for Phone Call        fine to add to order.  fax over here and i will sign.  Follow-up by: Ancil Boozer  MD,  Aug 23, 2009 9:38 AM  Additional Follow-up for Phone Call Additional follow up Details #1::        Verbal auth given. Additional Follow-up by: Garen Grams LPN,  Aug 23, 2128 9:41 AM

## 2010-05-23 NOTE — Assessment & Plan Note (Signed)
Summary: unsteady and leg giving way since ESI injection last Friday/ls   Vital Signs:  Patient profile:   60 year old female Height:      63.5 inches Weight:      203.4 pounds BMI:     35.59 Temp:     98.7 degrees F oral Pulse rate:   114 / minute BP sitting:   155 / 86  (right arm) Cuff size:   regular  Vitals Entered By: Garen Grams LPN (August 14, 2009 11:33 AM) CC: frequent falls Pain Assessment Patient in pain? yes     Location: back/legs   Primary Care Provider:  Ancil Boozer  MD  CC:  frequent falls.  History of Present Illness: falls/weakness:  60 yo with history of chronic pain and depression with psychosis who presents with frequent falls, weakness, tremulousness.  All of her symptoms started after reported ESI injection in her back 08/03/2009 by Dr Venetia Maxon at [facility].  She reports after waking up from anesthesia she has had a severe headache that is stabbing in nature.  this started immediately after anesthesia for which she was given ibuprofen.  since then she has continued with the headache but has developed trembling in her arms and legs, weakness, nausea, poor appetite and even vomiting and diarrhea.  she also has started falling more and more frequently and having a very unsteady gait.  she reports she fell just this AM at her Eye doctor's office (dr Dione Booze).  she reports that she called dr stern's office who recommended her lay flat and drink caffinated beverages (? for concern of post spinal headache?) but this hasn't helped her symptoms.  she additionally reports a fever once just post injection to 103 but none since.   Of note she was admitted to [facility] from 07/24/09-07/26/09 for detox from opiates and depression with psychosis.  she sees the pain clinic in [place] and psychiatry (Drs Gennie Alma and Jeannine Kitten)  Habits & Providers  Alcohol-Tobacco-Diet     Tobacco Status: current     Tobacco Counseling: to quit use of tobacco products  Cigarette Packs/Day: 0.5  Current Medications (verified): 1)  Dyazide 37.5-25 Mg Caps (Triamterene-Hctz) .... Take 1 Capsule By Mouth Once A Day 2)  Lyrica 50 Mg Caps (Pregabalin) .... Three Times A Day Per Dr Venetia Maxon 3)  Clonazepam 1 Mg Tabs (Clonazepam) .Marland Kitchen.. 1-1.5 By Mouth Two Times A Day As Needed and 2 At Bedtime Per Outside Dr 4)  Cymbalta 60 Mg Cpep (Duloxetine Hcl) .... Qday Per Outside Doctor 5)  Bromday 0.09 % Soln (Bromfenac Sodium) .Marland Kitchen.. 1 Drop L Eye Daily 6)  Omeprazole 20 Mg Cpdr (Omeprazole) .... Two Times A Day 7)  Besivance 0.6 % Susp (Besifloxacin Hcl) .Marland Kitchen.. 1 Drop Left Three Times A Day 8)  Flonase 50 Mcg/act Susp (Fluticasone Propionate) .... 2 Sprays Each Nostril Daily 9)  Cetirizine Hcl 10 Mg Tabs (Cetirizine Hcl) .... Daily 10)  Prednisolone Acetate 1 % Susp (Prednisolone Acetate) .... Eye Drops 1 Drop Both Eyes Qid  Allergies (verified): No Known Drug Allergies  Past History:  Past Medical History: CATARACT EXTRACTIONS, BILATERAL, HX OF (ICD-V45.61) CHRONIC PAIN SYNDROME (ICD-338.4) DEPRESSION, MAJOR, WITH PSYCHOTIC BEHAVIOR (ICD-298.0) TOBACCO USER (ICD-305.1) CHRONIC INTERSTITIAL CYSTITIS (ICD-595.1) GASTROESOPHAGEAL REFLUX DISEASE, CHRONIC (ICD-530.81) URINARY INCONTINENCE (ICD-788.30) Hx of BENIGN POSITIONAL VERTIGO (ICD-386.11) HYPERTENSION, BENIGN ESSENTIAL (ICD-401.1)  Family History: cad--daugher--mi age 37, dm--sister  Social History: Lives with daughter. .; Smokes 1/2 pk cig/day for 20+ years.; No alcohol  or drugsPacks/Day:  0.5  Review of Systems       per HPI  Physical Exam  General:  alert, obese AAF in NAD Head:  normocephalic and atraumatic.   Eyes:  vision grossly intact, pupils equal, pupils round, pupils reactive to light, and no injection.  EOMI Ears:  R ear normal, L ear normal, and no external deformities.   Nose:  External nasal examination shows no deformity or inflammation. Nasal mucosa are pink and moist without lesions or  exudates. Mouth:  Oral mucosa and oropharynx without  exudates. a few small apathous ulcers noted Neck:  No deformities, masses, or tenderness noted. Lungs:  Normal respiratory effort, chest expands symmetrically. Lungs are clear to auscultation, no crackles or wheezes. Heart:  Distant S1 and S2.  No mumurs noted Abdomen:  Bowel sounds positive,abdomen soft and non-tender without masses, organomegaly or hernias noted. Msk:  back nontender.  no obvious abnormalties on back examination.  Pulses:  2+ in all extremities Extremities:  no C/C/E Neurologic:  alert & oriented X3 and cranial nerves II-XII intact.  strength 5-/5 all extremities.  very tremulousness worsened with movement.  very unsteady gait - nearly fell 2 times in walking apprx 3 feet.  sensation intact.  DTRs symmectrical and normal. finger to nose slow and difficult with tremor bilaterally.  negative rhomberg Skin:  Intact without suspicious lesions or rashes Psych:  Oriented X3.  anxious appearing   Impression & Recommendations:  Problem # 1:  UNSTEADY GAIT (ICD-781.2) Assessment New  unclear etiology - ? if something compressing cord from injection vs deconditioning vs other.  will get PT/OT eval, check for drugs that could be cause with UDS/etoh level/tricyclic screen/acetaminophen and salicylate screen. also check for generalized abnormality with CBC with diff, Cmet, TSH, EKG, orthostatics.  I have asked Dr Venetia Maxon to see patient and weigh in if this could be related to injection as well.    if worsens acutely or weak on one side only would consider rapid imaging.  Orders: Hospital Admit-[facility] (00000)  Problem # 2:  TREMOR (ICD-781.0) Assessment: New  also started with injection but unclear etiology.  ? if could be mild seratonin syndrome? monitor very closely.  check electrolytes, tsh as noted above.  PT/OT for gait, etc.   Orders: Hospital Admit-[facility] (00000)  Problem # 3:  HYPERTENSION, BENIGN ESSENTIAL  (ICD-401.1) Assessment: Unchanged  continue home meds.  consider risk stratification as well as she is due from outpt standpoint. check EKG as well.   Her updated medication list for this problem includes:    Dyazide 37.5-25 Mg Caps (Triamterene-hctz) .Marland Kitchen... Take 1 capsule by mouth once a day  Orders: Hospital Admit-[facility] (00000)  Problem # 4:  CATARACT EXTRACTIONS, BILATERAL, HX OF (ICD-V45.61) Assessment: Unchanged  continue home eye drops  Orders: Hospital Admit-[facility] (00000)  Problem # 5:  CHRONIC PAIN SYNDROME (ICD-338.4) Assessment: Unchanged  continue lyrica and cymbalta for now. as needed tylenol/ibuprofen  Orders: Hospital Admit-[facility] (00000)  Problem # 6:  DEPRESSION, MAJOR, WITH PSYCHOTIC BEHAVIOR (ICD-298.0) Assessment: Unchanged  cymbalta  Orders: Hospital Admit-[facility] (00000)  Problem # 7:  TOBACCO USER (ICD-305.1) Assessment: Unchanged  nicotine patch, smoking cessation consult  Orders: Hospital Admit-[facility] (00000)  Problem # 8:  GASTROESOPHAGEAL REFLUX DISEASE, CHRONIC (ICD-530.81) Assessment: Unchanged  omeprazole  The following medications were removed from the medication list:    Prilosec Otc 20 Mg Tbec (Omeprazole magnesium) .Marland Kitchen... 1 by mouth once daily Her updated medication list for this problem includes:  Omeprazole 20 Mg Cpdr (Omeprazole) .Marland Kitchen..Marland Kitchen Two times a day  Orders: Hospital Admit-[facility] (00000)  Problem # 9:  prophylaxis Assessment: Unchanged Heparin 5000u subcutaneously three times a day  Problem # 10:  dispo pending clinical improvement, ability to be safe at home  Complete Medication List: 1)  Dyazide 37.5-25 Mg Caps (Triamterene-hctz) .... Take 1 capsule by mouth once a day 2)  Lyrica 50 Mg Caps (Pregabalin) .... Three times a day per dr Venetia Maxon 3)  Clonazepam 1 Mg Tabs (Clonazepam) .Marland Kitchen.. 1-1.5 by mouth two times a day as needed and 2 at bedtime per outside dr 4)  Cymbalta 60 Mg Cpep (Duloxetine hcl) .... Qday per outside doctor 5)   Bromday 0.09 % Soln (Bromfenac sodium) .Marland Kitchen.. 1 drop l eye daily 6)  Omeprazole 20 Mg Cpdr (Omeprazole) .... Two times a day 7)  Besivance 0.6 % Susp (Besifloxacin hcl) .Marland Kitchen.. 1 drop left three times a day 8)  Flonase 50 Mcg/act Susp (Fluticasone propionate) .... 2 sprays each nostril daily 9)  Cetirizine Hcl 10 Mg Tabs (Cetirizine hcl) .... Daily 10)  Prednisolone Acetate 1 % Susp (Prednisolone acetate) .... Eye drops 1 drop both eyes qid  Appended Document: unsteady and leg giving way since ESI injection last Friday/ls I interviewed and examined Ms [name] [name] with Dr Sandi Mealy on Tuesday AM 4/26 in the [facility]. I reviewed her orders and plans and agree with Dr Sandi Mealy.  Briefly as best can determine from patient and her son, she has been having increasing difficulty walking and more falllng since a spinal injection approximately 10 days ago.  Also increased tremors.  PMH is complex with recent psychiatric admission and spine injection and eye surgery and patient is vague as to dates and order of events.  On exam is awake and alert able to rise and walk with cane but is very unstable and would fall if not caught.  Distal strength in lower extrem is 5/5 but she has a coarse tremor in all extremities worse in upper.    Many possibilities for her presentation ranging from focal subacute complication of spine injection to medication side effects to mental illness.  Given her fall risk will admit, rule out metabolic derangement, consult NS for opinion on possibility of being related to recent injection and PT for balance evaluation.  If any acute worsening would get CNS imaging or if above work up is unrevealing

## 2010-05-23 NOTE — Assessment & Plan Note (Signed)
Summary: Diabetes - Rx Clinic   Vital Signs:  Patient profile:   60 year old female Weight:      196 pounds BP supine:   139 / 83  (left arm)  Primary Care Provider:  Helane Rima DO   History of Present Illness: Patient presents to pharmacy clinic looking a little down. She states she has had 2 recent deaths in the family. She has not taken her glimepiride in a week and has not been checking her blood sugar the past week either. Pt has had intolerance to metformin in the past (GI disturbances) and does not like the taste of the glimepiride.She did not bring a meter or blood sugar log today. She has been urinating frequently, has increased thirs, and decreased apetite. Also states she sweats a lot and wakes up in the middle of night sweating. Does not check blood sugar when this occurs. When she was checking her blood sugar, she stated it runs consistently in the 200's with no numbers <70.   Current Medications (verified): 1)  Adult Diapers .... As Needed.  Disp Enough For Three Times A Day Use For 1 Month. 2)  Onetouch Test  Strp (Glucose Blood) .... Dispense Quantity Sufficient For Twice Daily Blood Glucose Testing. 3)  Onetouch Delica Lancets  Misc (Lancets) .... Dispense Quantity Sufficient For Twice Daily Testing. 4)  Prozac 40 Mg Caps (Fluoxetine Hcl) .... One Daily 5)  Seroquel 200 Mg Tabs (Quetiapine Fumarate) .... 1/2 By Mouth Q Hs 6)  Ambien 10 Mg Tabs (Zolpidem Tartrate) .... One By Mouth Q Hs 7)  Nexium 40 Mg Cpdr (Esomeprazole Magnesium) .... Take 1 Tab Each Morning 8)  Lisinopril 40 Mg Tabs (Lisinopril) .Marland Kitchen.. 1 By Mouth Once Daily 9)  Anusol-Hc 2.5 % Crea (Hydrocortisone) .... Per Box Instructions 10)  Polyethylene Glycol 3350  Powd (Polyethylene Glycol 3350) .Marland KitchenMarland KitchenMarland Kitchen 17 Gm in 4-6 Oz Water and Drink Daily, Qs 11)  Zofran 4 Mg Tabs (Ondansetron Hcl) .Marland Kitchen.. 1 By Mouth Every 8 Hours As Needed For Nausea 12)  Zyrtec Allergy 10 Mg  Tabs (Cetirizine Hcl) .Marland Kitchen.. 1 Once Daily Prn 13)   Artificial Tears  Soln (Artificial Tear Solution) .Marland Kitchen.. 1 Gtt in Each Eye As Needed For Dry Eye 14)  Hydrochlorothiazide 25 Mg  Tabs (Hydrochlorothiazide) .... Take 1 Tab By Mouth Every Morning 15)  Glucotrol Xl 5 Mg Xr24h-Tab (Glipizide) .... Take 1 Tablet By Mouth Once Daily 16)  Colace 100 Mg Caps (Docusate Sodium) .Marland Kitchen.. 1 Tablet By Mouth Bid 17)  Nyamyc 100000 Unit/gm Powd (Nystatin) .... Apply Two Times A Day To Affected Area 18)  Nystatin 100000 Unit/gm Crea (Nystatin) .... Apply Two Times A Day Prn 19)  Prednisolone Acetate 1 % Susp (Prednisolone Acetate) .... Instill 1drop in Each Eye Two Times A Day  Allergies: 1)  Pristiq (Desvenlafaxine Succinate) 2)  Cymbalta (Duloxetine Hcl) 3)  Lyrica (Pregabalin)   Impression & Recommendations:  Problem # 1:  DIABETES MELLITUS, TYPE II (ICD-250.00) Assessment Unchanged  Diabetes type 2, currently under fair control based on A1C of 8.5 from november 2011. CBG's have averaged consistently in the 200's although patient has not been checking in the past week. Counseled patient to check blood sugar at least once daily and bring meter/log to next visit. Denies hypoglycemic events. Initiated Glipizide XL 5 mg daily and stopped glimepiride (pt does not like the taste of glimepiride). Will reevaluate regimen at next visit when blood sugars become available. DM control is suboptimal because of med  use/adherence. Written instructions provided. Follow up Rx clinic in 3 weeks. TTFFC 30 minutes. Patient seen with Lyna Poser, PharmD. The following medications were removed from the medication list:    Glimepiride 1 Mg Tabs (Glimepiride) .Marland Kitchen... 1 by mouth daily Her updated medication list for this problem includes:    Lisinopril 40 Mg Tabs (Lisinopril) .Marland Kitchen... 1 by mouth once daily    Glucotrol Xl 5 Mg Xr24h-tab (Glipizide) .Marland Kitchen... Take 1 tablet by mouth once daily  Orders: Reassessment Each 15 min unit- [facility] ([address])  Problem # 2:  HYPERTENSION, BENIGN  ESSENTIAL (ICD-401.1)  BP today 139/83. Pt brought all medicines to visit. Upon review, found that patient was taking lisinopril/HCTZ 20/25 combination once in the morning, Lisinopril 40 mg in the morning and afternoon, and HCTZ 25 mg in the morning and afternoon. Counseled patient to not take the combination pill anymore and to take the lisinopril 40 mg once daily and the HCTZ 25 mg once daily. This could be the reason for her increased urination as patient was taking 75 mg total mg of HCTZ. Will f/u BP at next visit.   Her updated medication list for this problem includes:    Lisinopril 40 Mg Tabs (Lisinopril) .Marland Kitchen... 1 by mouth once daily    Hydrochlorothiazide 25 Mg Tabs (Hydrochlorothiazide) .Marland Kitchen... Take 1 tab by mouth every morning  Orders: Reassessment Each 15 min unit- [facility] ([id number])  Complete Medication List: 1)  Adult Diapers  .... As needed.  disp enough for three times a day use for 1 month. 2)  Onetouch Test Strp (Glucose blood) .... Dispense quantity sufficient for twice daily blood glucose testing. 3)  Onetouch Delica Lancets Misc (Lancets) .... Dispense quantity sufficient for twice daily testing. 4)  Prozac 40 Mg Caps (Fluoxetine hcl) .... One daily 5)  Seroquel 200 Mg Tabs (Quetiapine fumarate) .... 1/2 by mouth q hs 6)  Ambien 10 Mg Tabs (Zolpidem tartrate) .... One by mouth q hs 7)  Nexium 40 Mg Cpdr (Esomeprazole magnesium) .... Take 1 tab each morning 8)  Lisinopril 40 Mg Tabs (Lisinopril) .Marland Kitchen.. 1 by mouth once daily 9)  Anusol-hc 2.5 % Crea (Hydrocortisone) .... Per box instructions 10)  Polyethylene Glycol 3350 Powd (Polyethylene glycol 3350) .Marland KitchenMarland Kitchen. 17 gm in 4-6 oz water and drink daily, qs 11)  Zofran 4 Mg Tabs (Ondansetron hcl) .Marland Kitchen.. 1 by mouth every 8 hours as needed for nausea 12)  Zyrtec Allergy 10 Mg Tabs (Cetirizine hcl) .Marland Kitchen.. 1 once daily prn 13)  Artificial Tears Soln (Artificial tear solution) .Marland Kitchen.. 1 gtt in each eye as needed for dry eye 14)  Hydrochlorothiazide 25 Mg  Tabs (Hydrochlorothiazide) .... Take 1 tab by mouth every morning 15)  Glucotrol Xl 5 Mg Xr24h-tab (Glipizide) .... Take 1 tablet by mouth once daily 16)  Colace 100 Mg Caps (Docusate sodium) .Marland Kitchen.. 1 tablet by mouth bid 17)  Nyamyc 100000 Unit/gm Powd (Nystatin) .... Apply two times a day to affected area 18)  Nystatin 100000 Unit/gm Crea (Nystatin) .... Apply two times a day prn 19)  Prednisolone Acetate 1 % Susp (Prednisolone acetate) .... Instill 1drop in each eye two times a day  Patient Instructions: 1)  Check your blood sugar at least once a day in the morning before you eat anything. 2)  Stop the glimepiride 3)  Start taking the NEW medicine Glipizide XL once daily 4)  Take the lisinopril 40 mg once a day 5)  Take the HCTZ 25 mg once a day  6)  Do not take the combination pill anymore 7)  Bring your blood sugar log or your meter with you at our next visit 8)  Make appointment with pharmacy clinic for 3 weeks Prescriptions: GLUCOTROL XL 5 MG XR24H-TAB (GLIPIZIDE) take 1 tablet by mouth once daily  #30 x 0   Entered by:   Lyna Poser PharmD   Authorized by:   Helane Rima DO   Signed by:   Madelon Lips Pharm D on 05/17/2010   Method used:   Electronically to        [facility]* (retail)       [address]       [address] Shipping/mailing       [place], [place]  [address]       Ph: [phone]       Fax: [phone]   RxID:   [id number] ZYRTEC ALLERGY 10 MG  TABS (CETIRIZINE HCL) 1 once daily prn  #30 x 3   Entered by:   Lyna Poser PharmD   Authorized by:   Helane Rima DO   Signed by:   Madelon Lips Pharm D on 05/17/2010   Method used:   Electronically to        [facility]* (retail)       [address]       [address] Shipping/mailing       [place], [place]  [address]       Ph: [phone]       Fax: [phone]   RxID:   [id number]    Orders Added: 1)  Reassessment Each 15 min unit- [facility]  [[id number]]  Appended Document: Diabetes - Rx Clinic Agree

## 2010-05-23 NOTE — Assessment & Plan Note (Signed)
Summary: F/U VISIT/[facility]   Vital Signs:  Patient profile:   60 year old female Height:      63.5 inches Weight:      205 pounds BMI:     35.87 Temp:     99.0 degrees F oral Pulse rate:   71 / minute BP sitting:   185 / 91  (left arm) Cuff size:   large  Vitals Entered By: Jimmy Footman, CMA (January 28, 2010 11:30 AM) CC: several issues, see bleow Is Patient Diabetic? Yes Did you bring your meter with you today? No   Primary Care Provider:  [name] MD  CC:  several issues and see bleow.  History of Present Illness: 60 yo F; please see extensive previous notes for more details: Patient presented with her grand-daughter today.  1. Rash: x several weeks, face, arms, back, intermittent. Several medications were adjusted/discontinued in order to evaluate etiology. Previously used OTC hydrocortisone cream on rash. Now using Aveeno cream. Nothing has helped. + itchy. "I'm burning from the inside."  2. Anxiety: SEVERE. Patient crying less during this visit than last week. Focused on her anger and depression/anxiety surrounding her workman's comp and PT. She feels that she is not supported by the doctor's in [place]. She denies SI/HI. She endorses auditory hallucinations - usually water sound, but sometimes people talking. Finished PT last week. Not taking previously Rx Klonidine.  3. HA: Chronic. Stopped taking Topamax 2/2 thinking that it was contributing to her rash. Endorses pain at top of head, always there - "since they did that shot in my back," "I feel something swirling in there," "...I think that they used a dirty needle."  4. Back Pain: Chronic issue. Wants referral to Orthopedic physician.  5. DM: On no meds. High 253. Low 190.   Denies fever/chills, CP, SOB, N/V/D, LE edema.  MEDS TAKING TODAY: Omeprazole, Clonidine, Fluoxetine  Habits & Providers  Alcohol-Tobacco-Diet     Tobacco Status: current     Tobacco Counseling: to quit use of tobacco products  Cigarette Packs/Day: <0.25  Current Medications (verified): 1)  Cetirizine Hcl 10 Mg Tabs (Cetirizine Hcl) .... One Daily 2)  Nystatin 100000 Unit/gm Powd (Nystatin) .... Apply To Lower Abdomen Two Times A Day As Needed. Disp 1 Bottle 3)  Adult Diapers .... As Needed.  Disp Enough For Three Times A Day Use For 1 Month. 4)  Onetouch Test  Strp (Glucose Blood) .... Dispense Quantity Sufficient For Twice Daily Blood Glucose Testing. 5)  Onetouch Delica Lancets  Misc (Lancets) .... Dispense Quantity Sufficient For Twice Daily Testing. 6)  Prozac 40 Mg Caps (Fluoxetine Hcl) .... One Daily 7)  Clonazepam 0.5 Mg Tabs (Clonazepam) .... One Two Times A Day 8)  Topamax 100 Mg Tabs (Topiramate) .... One By Mouth Daily 9)  Seroquel 50 Mg Tabs (Quetiapine Fumarate) .... One By Mouth Daily 10)  Ambien 10 Mg Tabs (Zolpidem Tartrate) .... One By Mouth Q Hs  Allergies (verified): 1)  Pristiq (Desvenlafaxine Succinate) PMH-FH-SH reviewed for relevance  Review of Systems      See HPI  Physical Exam  General:  Very anxious AA female, shaking she is so upset. Vitals reviewed. Lungs:  Normal respiratory effort and normal breath sounds.   Heart:  Normal rate and regular rhythm.   Pulses:  2+ bilateral DP pulses. Skin:  Few dry patches of skin on upper neck. No noticed rash on face or arms. Psych:  Not suicidal, severely anxious, easily distracted, poor concentration, and angry  at times.   Impression & Recommendations:  Problem # 1:  SKIN RASH (ICD-782.1) Assessment Unchanged Continue with current management.  Orders: CBC w/Diff-[facility] (85025) [facility]- Est  Level 4 (99214)  Problem # 2:  DEPRESSION, MAJOR, WITH PSYCHOTIC BEHAVIOR (ICD-298.0) Assessment: Unchanged  Added Seroquel to hs.  Orders: [facility]- Est  Level 4 (16109)  Problem # 3:  HEADACHE (ICD-784.0) Assessment: Unchanged  Rec restarting Topamax. Will ask pharmacy for help with this complicated patient. Note: HA history suggests that  patient feels that this is caused by previous injection. History of HA vague and not c/w most HAs.  Orders: [facility]- Est  Level 4 (99214)  Problem # 4:  CHRONIC PAIN SYNDROME (ICD-338.4) Assessment: Unchanged  Will review previous records and refer in appropriate.  Orders: [facility]- Est  Level 4 (60454)  Problem # 5:  DIABETES MELLITUS, TYPE II (ICD-250.00) Assessment: Unchanged  Add back medication at next visit. Patient does not tolerate Metformin 2/2 GI effects.  Orders: [facility]- Est  Level 4 (09811)  Complete Medication List: 1)  Cetirizine Hcl 10 Mg Tabs (Cetirizine hcl) .... One daily 2)  Nystatin 100000 Unit/gm Powd (Nystatin) .... Apply to lower abdomen two times a day as needed. disp 1 bottle 3)  Adult Diapers  .... As needed.  disp enough for three times a day use for 1 month. 4)  Onetouch Test Strp (Glucose blood) .... Dispense quantity sufficient for twice daily blood glucose testing. 5)  Onetouch Delica Lancets Misc (Lancets) .... Dispense quantity sufficient for twice daily testing. 6)  Prozac 40 Mg Caps (Fluoxetine hcl) .... One daily 7)  Clonazepam 0.5 Mg Tabs (Clonazepam) .... One two times a day 8)  Topamax 100 Mg Tabs (Topiramate) .... One by mouth daily 9)  Seroquel 50 Mg Tabs (Quetiapine fumarate) .... One by mouth daily 10)  Ambien 10 Mg Tabs (Zolpidem tartrate) .... One by mouth q hs  Other Orders: Comp Met-[facility] ([id number])  Patient Instructions: 1)  Please follow up weekly with PCP, Luretha Murphy, or Dr. Earlene Plater. 2)  Please take the Seroquel - one by mouth at night. Prescriptions: AMBIEN 10 MG TABS (ZOLPIDEM TARTRATE) one by mouth q hs  #30 x 0   Entered and Authorized by:   Helane Rima DO   Signed by:   Helane Rima DO on 01/28/2010   Method used:   Print then Give to Patient   RxID:   [id number] SEROQUEL 50 MG TABS (QUETIAPINE FUMARATE) one by mouth daily  #30 x 0   Entered and Authorized by:   Helane Rima DO   Signed by:   Helane Rima DO on  01/28/2010   Method used:   Electronically to        [facility] (retail)       [address]       [place], [place]  [id number]       Ph: [phone]       Fax: [phone]   RxID:   [id number]   Prevention & Chronic Care Immunizations   Influenza vaccine: Fluvax 3+  (01/24/2008)   Influenza vaccine due: 01/23/2009    Tetanus booster: 07/20/2005: Done.   Tetanus booster due: 07/21/2015    Pneumococcal vaccine: Not documented    H. zoster vaccine: Not documented  Colorectal Screening   Hemoccult: Not documented   Hemoccult action/deferral: Not indicated  (09/14/2009)    Colonoscopy: Done.  (08/19/2005)   Colonoscopy due: 08/20/2015  Other Screening   Pap smear:  normal  (08/10/2007)   Pap smear action/deferral: Not indicated S/P hysterectomy  (11/13/2008)   Pap smear due: 08/09/2008    Mammogram: Done.  (07/21/1998)   Mammogram due: 07/21/1999    DXA bone density scan: Not documented   Smoking status: current  (01/28/2010)   Smoking cessation counseling: yes  (06/14/2008)  Diabetes Mellitus   HgbA1C: 8.7  (12/07/2009)    Eye exam: Not documented    Foot exam: yes  (12/07/2009)   High risk foot: Not documented   Foot care education: Not documented    Urine microalbumin/creatinine ratio: Not documented    Diabetes flowsheet reviewed?: Yes   Progress toward A1C goal: Unchanged  Lipids   Total Cholesterol: 200  (08/10/2007)   LDL: 91  (08/10/2007)   LDL Direct: Not documented   HDL: 51  (08/10/2007)   Triglycerides: 290  (08/10/2007)  Hypertension   Last Blood Pressure: 185 / 91  (01/28/2010)   Serum creatinine: 0.95  (10/29/2009)   Serum potassium 3.7  (10/29/2009) CMP ordered     Hypertension flowsheet reviewed?: Yes   Progress toward BP goal: Deteriorated  Self-Management Support :   Personal Goals (by the next clinic visit) :     Personal A1C goal: 8  (09/14/2009)     Personal blood pressure goal: 130/80   (09/14/2009)     Personal LDL goal: 100  (09/14/2009)    Patient will work on the following items until the next clinic visit to reach self-care goals:     Medications and monitoring: take my medicines every day, bring all of my medications to every visit  (01/28/2010)     Eating: drink diet soda or water instead of juice or soda, eat more vegetables, use fresh or frozen vegetables, eat foods that are low in salt, eat baked foods instead of fried foods, eat fruit for snacks and desserts, limit or avoid alcohol  (01/28/2010)     Activity: take a 30 minute walk every day, take the stairs instead of the elevator, park at the far end of the parking lot  (01/28/2010)    Diabetes self-management support: Written self-care plan  (01/28/2010)   Diabetes care plan printed    Diabetes self-management support not done because: Not indicated  (09/14/2009)    Hypertension self-management support: Written self-care plan  (01/28/2010)   Hypertension self-care plan printed.    Hypertension self-management support not done because: Good outcomes  (09/14/2009)

## 2010-05-23 NOTE — Assessment & Plan Note (Signed)
Summary: Cough and congestion   Vital Signs:  Patient Profile:   60 Years Old Female Height:     63.5 inches (162.56 cm) Weight:      193 pounds O2 Sat:      98 % O2 treatment:    Room Air Temp:     98.2 degrees F Pulse rate:   83 / minute BP sitting:   126 / 83  Pt. in pain?   yes    Location:   chest back and stomach    Intensity:   10  Vitals Entered By: Jone Baseman CMA (January 24, 2008 8:29 AM)                   PCP:  Ruthe Mannan MD  Chief Complaint:  COUGH AND CONGESTION X 3-4 WEEKS.  History of Present Illness: 60 yo female with 3 week h/o of productive cough, congestion, and chills..  Denies any fevers, myalgias, wheezing, or other symptoms.  She states she is coughing up greenish phelgm.  has been taking Mucinex with no relief of symptoms.     Current Allergies: No known allergies    Family History:    Reviewed history from 06/18/2006 and no changes required:       cad--daugher--mi age 62, dm--sister, hpt-sister  Social History:    Reviewed history from 06/18/2006 and no changes required:       Lives with daughter.  Works at [employer] or.; Smokes 5-6 cig/day for 20 years.; No alcohol or drugs   Risk Factors:  Colonoscopy History:     Date of Last Colonoscopy:  08/19/2005   Mammogram History:     Date of Last Mammogram:  07/21/1998    Review of Systems      See HPI   Physical Exam  General:     Well-developed,well-nourished,in no acute distress; alert,appropriate and cooperative throughout examination Lungs:     Normal respiratory effort, chest expands symmetrically. Lungs are clear to auscultation, no crackles or wheezes. Heart:     Distant S1 and S2.  No mumurs noted    Impression & Recommendations:  Problem # 1:  URI (ICD-465.9) Likely viral uri with bronchitis.  But will cover for CAP with Azithromycin given duration of symptoms and productive sputum.  She is to follow up with me after she has completed 5 day course if  symptoms have not resolved. Orders: [facility]- Est Level  3 (16109)   Complete Medication List: 1)  Dyazide 37.5-25 Mg Caps (Triamterene-hctz) .Marland Kitchen.. 1 tab by mouth daily 2)  Proventil Hfa 108 (90 Base) Mcg/act Aers (Albuterol sulfate) .Marland Kitchen.. 1-2 puffs q four hours as needed. 3)  Aciphex 20 Mg Tbec (Rabeprazole sodium) .Marland Kitchen.. 1 tab by mouth daily 4)  Ambien Cr 6.25 Mg Tbcr (Zolpidem tartrate) .... Take 1 tablet by mouth at bedtime 5)  Dyazide 37.5-25 Mg Caps (Triamterene-hctz) .... Take 1 capsule by mouth once a day 6)  Meclizine Hcl 12.5 Mg Tabs (Meclizine hcl) .... Take 1 tablet by mouth twice a day 7)  Detrol La 2 Mg Cp24 (Tolterodine tartrate) .Marland Kitchen.. 1 tablet by mouth daily 8)  Zithromax 250 Mg Tabs (Azithromycin) .... 500 mg po on day 1 followed by 250 mg/day as a single dose on days 2-5 dispense qs  Other Orders: Flu Vaccine 35yrs + (60454) Admin 1st Vaccine (09811)    Prescriptions: ZITHROMAX 250 MG TABS (AZITHROMYCIN) 500 mg po on day 1 followed by 250 mg/day as a single dose on  days 2-5 dispense qs  #1 x 0   Entered and Authorized by:   Ruthe Mannan MD   Signed by:   Ruthe Mannan MD on 01/24/2008   Method used:   Electronically to        [facility]  [address] [id number] (retail)       [address]       [place], [place]  [address]       Ph: [phone] or [phone]       Fax: [phone]   RxID:   [id number]  ]  Influenza Vaccine    Vaccine Type: Fluvax 3+    Site: right deltoid    Mfr: GlaxoSmithKline    Dose: 0.5 ml    Route: IM    Given by: JESSICA FLEEGER CMA    Exp. Date: 10/18/2008    Lot #: [id number]    VIS given: 11/12/06 version given January 24, 2008.  Flu Vaccine Consent Questions    Do you have a history of severe allergic reactions to this vaccine? no    Any prior history of allergic reactions to egg and/or gelatin? no    Do you have a sensitivity to the preservative Thimersol? no    Do you have a past history of  Guillan-Barre Syndrome? no    Do you currently have an acute febrile illness? no    Have you ever had a severe reaction to latex? no    Vaccine information given and explained to patient? yes    Are you currently pregnant? no

## 2010-05-23 NOTE — Miscellaneous (Signed)
Summary: outside labs  Clinical Lists Changes  Observations: Added new observation of PLATELETK/UL: 215 K/uL (08/14/2009 13:37) Added new observation of HCT: 37.9 % (08/14/2009 13:37) Added new observation of HGB: 12.9 g/dL (04/54/0981 19:14) Added new observation of WBC COUNT: 9.3 10*3/microliter (08/14/2009 13:37) Added new observation of TSH: 1.486 microintl units/mL (08/14/2009 13:37) Added new observation of CALCIUM: 9.1 mg/dL (78/29/5621 30:86) Added new observation of ALBUMIN: 3.8 g/dL (57/84/6962 95:28) Added new observation of PROTEIN, TOT: 7.3 g/dL (41/32/4401 02:72) Added new observation of SGPT (ALT): 29 units/L (08/14/2009 13:37) Added new observation of SGOT (AST): 23 units/L (08/14/2009 13:37) Added new observation of ALK PHOS: 60 units/L (08/14/2009 13:37) Added new observation of BILI TOTAL: 0.5 mg/dL (53/66/4403 47:42) Added new observation of CREATININE: 0.7 mg/dL (59/56/3875 64:33) Added new observation of BUN: 14 mg/dL (29/51/8841 66:06) Added new observation of BG RANDOM: 177 mg/dL (30/16/0109 32:35) Added new observation of CO2 PLSM/SER: 27 meq/L (08/14/2009 13:37) Added new observation of CL SERUM: 100 meq/L (08/14/2009 13:37) Added new observation of K SERUM: 3.9 meq/L (08/14/2009 13:37) Added new observation of NA: 134 meq/L (08/14/2009 13:37)

## 2010-05-23 NOTE — Miscellaneous (Signed)
Summary: Patient Summary  Clinical Lists Changes 60 yo female who frequently comes in with multiple complaints, most often her stomach or pelvis.  1.  Abdominal pain- has been to GI, Dr. Arlyce Dice for reflux.  Was self referred to Dr. Perlie Gold, OBGYN as well.  At this point, we think it is a combination of interstitial cystitis, anxiety, and GERD.   2.  Insomina- has many life stressors.  Has tried different medications and we have talked about sleep hygeine.  Medical history and medications reviewed.  Medications updated.  Problems: Removed problem of HEMORRHAGE OF RECTUM AND ANUS (ICD-569.3) Removed problem of COUGH (ICD-786.2) Removed problem of EAR PAIN, BILATERAL (ICD-388.70) Removed problem of DYSURIA (ICD-788.1) Removed problem of SCREENING FOR MALIGNANT NEOPLASM OF THE CERVIX (ICD-V76.2) Removed problem of FREQUENCY, URINARY (ICD-788.41) Added new problem of CHRONIC INTERSTITIAL CYSTITIS (ICD-595.1) Removed problem of PILONIDAL CYST (ICD-685.1)  Appended Document: Patient Summary    Clinical Lists Changes  Observations: Added new observation of PAPRECACT: Not indicated S/P hysterectomy (11/13/2008 11:38) Added new observation of DM PROGRESS: N/A (11/13/2008 11:38) Added new observation of DM FSREVIEW: N/A (11/13/2008 11:38) Added new observation of LIPID PROGRS: N/A (11/13/2008 11:38) Added new observation of LIPID FSREVW: N/A (11/13/2008 11:38)       Prevention & Chronic Care Immunizations   Influenza vaccine: Fluvax 3+  (01/24/2008)   Influenza vaccine due: 01/23/2009    Tetanus booster: 07/20/2005: Done.   Tetanus booster due: 07/21/2015    Pneumococcal vaccine: Not documented    H. zoster vaccine: Not documented  Colorectal Screening   Hemoccult: Not documented    Colonoscopy: Done.  (08/19/2005)   Colonoscopy due: 08/20/2015  Other Screening   Pap smear: normal  (08/10/2007)   Pap smear action/deferral: Not indicated S/P hysterectomy  (11/13/2008)   Pap smear due: 08/09/2008    Mammogram: Done.  (07/21/1998)   Mammogram due: 07/21/1999    DXA bone density scan: Not documented   Smoking status: current  (05/19/2007)   Smoking cessation counseling: yes  (06/14/2008)  Lipids   Total Cholesterol: 200  (08/10/2007)   LDL: 91  (08/10/2007)   LDL Direct: Not documented   HDL: 51  (08/10/2007)   Triglycerides: 290  (08/10/2007)  Hypertension   Last Blood Pressure: 135 / 80  (06/14/2008)   Serum creatinine: 0.87  (08/10/2007)   Serum potassium 4.1  (08/10/2007)  Self-Management Support :    Hypertension self-management support: Not documented

## 2010-05-23 NOTE — Progress Notes (Signed)
Summary: Orders Needed  Phone Note Other Incoming Call back at [phone]   Caller: Drucie Ip PT [facility] Summary of Call: Would like to have a Child psychotherapist order.  Pt is very overwhelmed and they feel this is needed. Initial call taken by: Clydell Hakim,  Aug 24, 2009 9:35 AM  Follow-up for Phone Call        see note from yesterday Follow-up by: Ancil Boozer  MD,  Aug 24, 2009 12:32 PM  Additional Follow-up for Phone Call Additional follow up Details #1::        verbal auth left on vm Additional Follow-up by: Garen Grams LPN,  Aug 25, 863 2:08 PM

## 2010-05-23 NOTE — Progress Notes (Signed)
Summary: phn msg  Phone Note Call from Patient Call back at Home Phone [phone]   Caller: Aurther Loft Summary of Call: Has one more quick question. Initial call taken by: Clydell Hakim,  Sep 18, 2009 2:51 PM  Follow-up for Phone Call        he wanted to know how long the mri lasts so he can pick her up. told him approximately 1 hr Follow-up by: Golden Circle RN,  Sep 18, 2009 3:23 PM

## 2010-05-23 NOTE — Consult Note (Signed)
Summary: [facility] Visit  [facility] Visit   Imported By: Clydell Hakim 09/21/2009 10:28:13  _____________________________________________________________________  External Attachment:    Type:   Image     Comment:   External Document

## 2010-05-23 NOTE — Assessment & Plan Note (Signed)
Summary: Refills on BP meds & PCP Change  Medications Added DYAZIDE 37.5-25 MG CAPS (TRIAMTERENE-HCTZ) 1 tab by mouth daily PROVENTIL HFA 108 (90 BASE) MCG/ACT  AERS (ALBUTEROL SULFATE) 1-2 puffs q four hours as needed. ACIPHEX 20 MG  TBEC (RABEPRAZOLE SODIUM) 1 tab by mouth daily ZITHROMAX Z-PAK 250 MG  TABS (AZITHROMYCIN) as directed AMBIEN CR 6.25 MG TBCR (ZOLPIDEM TARTRATE) Take 1 tablet by mouth at bedtime DYAZIDE 37.5-25 MG CAPS (TRIAMTERENE-HCTZ) Take 1 capsule by mouth once a day MECLIZINE HCL 12.5 MG TABS (MECLIZINE HCL) Take 1 tablet by mouth twice a day OMEPRAZOLE 10 MG CPDR (OMEPRAZOLE) Take 1 capsule by mouth once a day      Allergies Added: NKDA  Vital Signs:  Patient Profile:   60 Years Old Female Height:     64 inches (162.56 cm) Weight:      194.2 pounds Temp:     98.5 degrees F oral Pulse rate:   91 / minute BP sitting:   120 / 76  Pt. in pain?   no  Vitals Entered By: Garen Grams LPN (May 19, 2007 10:39 AM)                  PCP:  Ruthe Mannan MD  Chief Complaint:  F/u on Bp.  History of Present Illness: 60 year old female presents for BP check and to meet new MD.  Also complains of productive cough x 1 week.  States she is coughing up greenish sputum.  Denies fevers, chills, SOB.  Has been taking OTC mucinex with mild relief of symptoms.      Hypertension History:      She denies headache, chest pain, palpitations, dyspnea with exertion, peripheral edema, visual symptoms, syncope, and side effects from treatment.        Positive major cardiovascular risk factors include female age 74 years old or older, hypertension, and current tobacco user.     Current Allergies: No known allergies   Past Medical History:    Reviewed history from 06/18/2006 and no changes required:       L. arm pit abcess--I & D 4/01  Past Surgical History:    Reviewed history from 06/18/2006 and no changes required:       Cholecystectomy - 04/21/1997, tah and single  oophorectomy--fibroids - 04/22/1991   Family History:    Reviewed history from 06/18/2006 and no changes required:       cad--daugher--mi age 41, dm--sister, hpt-sister  Social History:    Reviewed history from 06/18/2006 and no changes required:       Lives with daughter.  Works at [employer] or.; Smokes 5-6 cig/day for 20 years.; No alcohol or drugs   Risk Factors:  Tobacco use:  current    Cigarettes:  Yes -- 1/4 pack(s) per day   Review of Systems      See HPI   Physical Exam  General:     anxious appearing Lungs:     Normal respiratory effort, chest expands symmetrically. Lungs are clear to auscultation, no crackles or wheezes. Heart:     Distant S1 and S2.  No mumurs noted Abdomen:     Bowel sounds positive,abdomen soft and non-tender without masses, organomegaly or hernias noted.    Impression & Recommendations:  Problem # 1:  HYPERTENSION, BENIGN ESSENTIAL (ICD-401.1) Established problem- stable.  Continue current regimen. Her updated medication list for this problem includes:    Dyazide 37.5-25 Mg Caps (Triamterene-hctz) .Marland Kitchen... 1 tab by mouth  daily   Her updated medication list for this problem includes:    Dyazide 37.5-25 Mg Caps (Triamterene-hctz) .Marland Kitchen... 1 tab by mouth daily    Dyazide 37.5-25 Mg Caps (Triamterene-hctz) .Marland Kitchen... Take 1 capsule by mouth once a day  Orders: [facility]- Est Level  3 ([id number])   Problem # 2:  URI (ICD-465.9) New problem- advised that this is likely viral and does not need antibiotics.  She is very anxious and concerned that it has lasted so long.  I am not concerned for pneumonia as pulse ox showed she is sating 99% on RA, she is not dyspneic and she is afebrile.  She does have inc sputum productive.  Gave her a z pack and advised her not to take it unless her symptoms do not improve within one week.  She expressed understanding.  ADvised to go to ED or return to [place] if symptoms like SOB develop. Orders: [facility]- Est Level  3  ([id number])   Complete Medication List: 1)  Dyazide 37.5-25 Mg Caps (Triamterene-hctz) .Marland Kitchen.. 1 tab by mouth daily 2)  Proventil Hfa 108 (90 Base) Mcg/act Aers (Albuterol sulfate) .Marland Kitchen.. 1-2 puffs q four hours as needed. 3)  Aciphex 20 Mg Tbec (Rabeprazole sodium) .Marland Kitchen.. 1 tab by mouth daily 4)  Zithromax Z-pak 250 Mg Tabs (Azithromycin) .... As directed 5)  Ambien Cr 6.25 Mg Tbcr (Zolpidem tartrate) .... Take 1 tablet by mouth at bedtime 6)  Dyazide 37.5-25 Mg Caps (Triamterene-hctz) .... Take 1 capsule by mouth once a day 7)  Meclizine Hcl 12.5 Mg Tabs (Meclizine hcl) .... Take 1 tablet by mouth twice a day 8)  Omeprazole 10 Mg Cpdr (Omeprazole) .... Take 1 capsule by mouth once a day  Hypertension Assessment/Plan:      The patient's hypertensive risk group is category B: At least one risk factor (excluding diabetes) with no target organ damage.  Today's blood pressure is 120/76.       Prescriptions: ZITHROMAX Z-PAK 250 MG  TABS (AZITHROMYCIN) as directed  #1 x 0   Entered and Authorized by:   Ruthe Mannan MD   Signed by:   Ruthe Mannan MD on 05/19/2007   Method used:   Electronically sent to ...       [id number]       [address]       [place], [place]  [address]       Ph: [phone] or [phone]       Fax: [phone]   RxID:   [id number] ACIPHEX 20 MG  TBEC (RABEPRAZOLE SODIUM) 1 tab by mouth daily  #30 x 3   Entered and Authorized by:   Ruthe Mannan MD   Signed by:   Ruthe Mannan MD on 05/19/2007   Method used:   Electronically sent to ...       [id number]       [address]       [place], [place]  [address]       Ph: [phone] or [phone]       Fax: [phone]   RxID:   [id number] PROVENTIL HFA 108 (90 BASE) MCG/ACT  AERS (ALBUTEROL SULFATE) 1-2 puffs q four hours as needed.  #1 x 0   Entered and Authorized by:   Ruthe Mannan MD   Signed by:  Ruthe Mannan MD on  05/19/2007   Method used:   Electronically sent to ...       [id number]       [address] [place]       [place], [place]  [address]       Ph: [phone] or [phone]       Fax: [phone]   RxID:   [id number] DYAZIDE 37.5-25 MG CAPS (TRIAMTERENE-HCTZ) 1 tab by mouth daily  #30 x 6   Entered and Authorized by:   Ruthe Mannan MD   Signed by:   Ruthe Mannan MD on 05/19/2007   Method used:   Electronically sent to ...       [id number]       [address]       [place], [place]  [address]       Ph: [phone] or [phone]       Fax: [phone]   RxID:   [id number]  ]  Vital Signs:  Patient Profile:   60 Years Old Female Height:     64 inches (162.56 cm) Weight:      194.2 pounds Temp:     98.5 degrees F oral Pulse rate:   91 / minute BP sitting:   120 / 76

## 2010-05-23 NOTE — Consult Note (Signed)
Summary: [facility]   Imported By: Haydee Salter 02/09/2007 13:49:57  _____________________________________________________________________  External Attachment:    Type:   Image     Comment:   External Document

## 2010-05-23 NOTE — Progress Notes (Signed)
Summary: phn msg  Phone Note Call from Patient Call back at [facility] Phone [phone]   Caller: Patient Summary of Call: needs to talk to [facility] about GI doc Initial call taken by: De Nurse,  March 07, 2010 2:02 PM  Follow-up for Phone Call        called pt. appt 04-25-09 with dr.mann. Follow-up by: Arlyss Repress CMA,,  March 07, 2010 4:42 PM

## 2010-05-23 NOTE — Progress Notes (Signed)
Summary: referral form  Phone Note Call from Patient Call back at Home Phone [phone]   Caller: Son-[name] [name] Summary of Call: lost the sheet that they needed to go get MRI with.  needs another for tomorrow at 9:30 Initial call taken by: De Nurse,  Sep 18, 2009 11:35 AM  Follow-up for Phone Call        Called pts son.  No answer and no machine.  Will await callback for further details Follow-up by: Jone Baseman CMA,  Sep 18, 2009 2:02 PM  Additional Follow-up for Phone Call Additional follow up Details #1::        call complete Additional Follow-up by: Jone Baseman CMA,  Sep 18, 2009 2:43 PM

## 2010-05-23 NOTE — Assessment & Plan Note (Signed)
Summary: Diabetes Education - Glucose Meter Education - Rx Clinic   Vital Signs:  Patient profile:   60 year old female Height:      63.5 inches Weight:      212.3 pounds BMI:     37.15 Pulse rate:   98 / minute BP sitting:   132 / 83  (left arm)  Primary Care Jonell Brumbaugh:  [name] MD   History of Present Illness:  Patient arrives accompanied by her Granddaughter - [name].   She ambulates with assistance of a walker.   Patient educated on use of ONE TOUCH Ultra 2 meter.  Meter, lancet and 10 strips provided.  Blood Glucose in office today was 194.     Ms. Horn is WD, WN, and in NAD.  She is following up today in Rx clinic for a recent type two DM diagnosis (A1c 8.7).  She reports there has been a lot of confusion up to this point with regards to obtaining a glucose meter, and has not been able to check her blood sugars since she was diagnosed.  She is only able to tolerate 500 mg of metformin daily due  to GI upset.  Has a very flat affect, and requires very extensive explanations regarding education about diabetes.  Reports that having diabetes scares her, but she wants to try hard to get her sugar under better control.  She currently smokes 3 to 4 cigarettes daily and is interested in quitting entirely.      Current Medications (verified): 1)  Omeprazole 20 Mg Cpdr (Omeprazole) .... Two Times A Day 2)  Bepreve 1.5 % Soln (Bepotastine Besilate) .... 2 Drops 4 Times Daily. 3)  Flonase 50 Mcg/act Susp (Fluticasone Propionate) .... 2 Sprays Each Nostril Daily 4)  Cetirizine Hcl 10 Mg Tabs (Cetirizine Hcl) .... Daily 5)  Nystatin 100000 Unit/gm Powd (Nystatin) .... Apply To Lower Abdomen Two Times A Day As Needed. Disp 1 Bottle 6)  Topiramate 25 Mg Tabs (Topiramate) .... 2 Tabs Three Times A Day Per Outside Clinic 7)  Metformin Hcl 1000 Mg Tabs (Metformin Hcl) .Marland Kitchen.. 1 By Mouth Daily For Diabetes 8)  Ensure  Liqd (Nutritional Supplements) .Marland Kitchen.. 1 Drink Two Times A Day As  Needed.  Disp 1 Case. 9)  Adult Diapers .... As Needed.  Disp Enough For Three Times A Day Use For 1 Month. 10)  Triamcinolone Acetonide 0.5 % Crea (Triamcinolone Acetonide) .... Apply To Itchy Areas Two Times A Day As Needed.  Disp 60g Tube 11)  Furosemide 20 Mg Tabs (Furosemide) .Marland Kitchen.. 1 By Mouth Qam For Excess Fluid 12)  Mupirocin 2 % Oint (Mupirocin) .... Apply As Needed To Fingers.  Disp 22g Tube 13)  Glyburide 5 Mg Tabs (Glyburide) .... Take One Tablet Once A Day. 14)  Hydroxyzine Hcl 50 Mg Tabs (Hydroxyzine Hcl) .... One Tablet As Needed For Itching.  Allergies (verified): 1)  Pristiq (Desvenlafaxine Succinate)   Impression & Recommendations:  Problem # 1:  DIABETES MELLITUS, TYPE II (ICD-250.00)  Recently New Diagnosis of Diabetes type 2 currently under: fair to poor control of blood glucose based on A1C of: 8.7, and random reading of: 194 Control is suboptimal due to: poor diet as well as lack of education about healthy eating habits, and not having a meter to check her glucose levels. Reports hyperglycemic events and wakes up during the middle of the night to go to the bathroom 3 times per night.  Able to verbalize appropriate hypoglycemia management plan. Continue current  medications at this time for diabetes, check blood sugar each morning upon waking up, as well as one other time during the day (before lunch, dinner, or bedtime) and whenever she feels like she is experiencing a hyper-/hypoglycemic episodeFollowing step-by-step interactive instructions on the use of a One Touch Ultra Meter patient expressed understandingr.    Written pt instructions provided:  F/U iwth Dr. Leeroy Bock in the next 2 weeks.   F/U Rx Clinic Visit: 4-5 weeks  TTFFC:  65 mins.  Pt seen with: Kennieth Francois, PharmD Candidate  Her updated medication list for this problem includes:    Metformin Hcl 1000 Mg Tabs (Metformin hcl) .Marland Kitchen... 1/2 tablet (500mg )  by mouth daily for diabetes    Glyburide 5 Mg Tabs  (Glyburide) .Marland Kitchen... Take one tablet once a day.  Orders: Inital Assessment Each - [facility] [id number])  Problem # 2:  TOBACCO USER (ICD-305.1)  Mild  Nicotine Abuse for the past several months after her daughter died. Patient is a: good candidate for success b/c she only smokes 3 to 4 cigarettes per day, and she expressed a great interest in quitting smoking and is ready to quit.  Counseled patient on the health benefits of quitting smoking.  Ask patient about current smoking status at next visit to assess if NRT (patches) are needed to help her quit.  She was given patches when she was in the hospital several months ago, and they were very effective at reducing cravings.  Written information provided.  Orders: Inital Assessment Each - [facility] [id number])  Complete Medication List: 1)  Omeprazole 20 Mg Cpdr (Omeprazole) .... Two times a day 2)  Bepreve 1.5 % Soln (Bepotastine besilate) .... 2 drops 4 times daily. 3)  Flonase 50 Mcg/act Susp (Fluticasone propionate) .... 2 sprays each nostril daily 4)  Cetirizine Hcl 10 Mg Tabs (Cetirizine hcl) .... Daily 5)  Nystatin 100000 Unit/gm Powd (Nystatin) .... Apply to lower abdomen two times a day as needed. disp 1 bottle 6)  Topiramate 25 Mg Tabs (Topiramate) .... 2 tabs three times a day per outside clinic 7)  Metformin Hcl 1000 Mg Tabs (Metformin hcl) .... 1/2 tablet (500mg )  by mouth daily for diabetes 8)  Ensure Liqd (Nutritional supplements) .Marland Kitchen.. 1 drink two times a day as needed.  disp 1 case. 9)  Adult Diapers  .... As needed.  disp enough for three times a day use for 1 month. 10)  Triamcinolone Acetonide 0.5 % Crea (Triamcinolone acetonide) .... Apply to itchy areas two times a day as needed.  disp 60g tube 11)  Furosemide 20 Mg Tabs (Furosemide) .Marland Kitchen.. 1 by mouth qam for excess fluid 12)  Mupirocin 2 % Oint (Mupirocin) .... Apply as needed to fingers.  disp 22g tube 13)  Glyburide 5 Mg Tabs (Glyburide) .... Take one tablet once a day. 14)   Hydroxyzine Hcl 50 Mg Tabs (Hydroxyzine hcl) .... One tablet as needed for itching. 15)  Onetouch Test Strp (Glucose blood) .... Dispense quantity sufficient for twice daily blood glucose testing. 16)  Onetouch Delica Lancets Misc (Lancets) .... Dispense quantity sufficient for twice daily testing.  Patient Instructions: 1)  Check your blood sugar each morning AND one other time per day (prior to a meal - Lunch or Dinner OR at Bedtime).  Record your readings in your blood glucose log book.   2)  Readings in the morning should be aournd 100 (80-125)  AND Blood glucose readings at all other times during the day should be <  200.  3)  Continue to take your diabetes medicine as previous.  No changes in diabetes medicine today.  4)  Try to eat healthy by eating more vegetables.   5)  Exercise as much as you can.  6)  Follow up with Dr. Lula Olszewski in the next 2 weeks.  7)  Call [facility]  [phone]) and set up an appointment for your toes to examined and toe nails trimmed.  Prescriptions: METFORMIN HCL 1000 MG TABS (METFORMIN HCL) 1/2 tablet (500mg )  by mouth daily for diabetes  #1 x 0   Entered by:   Madelon Lips Pharm D   Authorized by:   [name] MD   Signed by:   Madelon Lips Pharm D on 12/14/2009   Method used:   Historical   RxID:   [id number] ONETOUCH DELICA LANCETS  MISC (LANCETS) Dispense quantity sufficient for twice daily testing.  #1 x 11   Entered by:   Madelon Lips Pharm D   Authorized by:   [name] MD   Signed by:   Madelon Lips Pharm D on 12/14/2009   Method used:   Electronically to        [facility] (retail)       [address]       [place], [place]  [id number]       Ph: [phone]       Fax: [phone]   RxID:   [id number] ONETOUCH TEST  STRP (GLUCOSE BLOOD) Dispense quantity sufficient for twice daily blood glucose testing.  #1 x 11   Entered by:   Madelon Lips Pharm D   Authorized by:   [name] MD   Signed by:   Madelon Lips Pharm D on 12/14/2009   Method used:   Electronically to        [facility] (retail)       [address]       [place], [place]  [id number]       Ph: [phone]       Fax: [phone]   RxID:   [id number] HYDROXYZINE HCL 50 MG TABS (HYDROXYZINE HCL) One tablet as needed for itching.  #30 x 0   Entered and Authorized by:   Christian Mate D   Signed by:   Madelon Lips Pharm D on 12/14/2009   Method used:   Historical   RxID:   [id number]   Prevention & Chronic Care Immunizations   Influenza vaccine: Fluvax 3+  (01/24/2008)   Influenza vaccine due: 01/23/2009    Tetanus booster: 07/20/2005: Done.   Tetanus booster due: 07/21/2015    Pneumococcal vaccine: Not documented    H. zoster vaccine: Not documented  Colorectal Screening   Hemoccult: Not documented   Hemoccult action/deferral: Not indicated  (09/14/2009)    Colonoscopy: Done.  (08/19/2005)   Colonoscopy due: 08/20/2015  Other Screening   Pap smear: normal  (08/10/2007)   Pap smear action/deferral: Not indicated S/P hysterectomy  (11/13/2008)   Pap smear due: 08/09/2008    Mammogram: Done.  (07/21/1998)   Mammogram due: 07/21/1999    DXA bone density scan: Not documented   Smoking status: current  (10/29/2009)   Smoking cessation counseling: yes  (06/14/2008)  Diabetes Mellitus   HgbA1C: 8.7  (12/07/2009)    Eye exam: Not documented    Foot exam: yes  (12/07/2009)   High risk foot: Not documented   Foot care education: Not documented  Urine microalbumin/creatinine ratio: Not documented  Lipids   Total Cholesterol: 200  (08/10/2007)   LDL: 91  (08/10/2007)   LDL Direct: Not documented   HDL: 51  (08/10/2007)   Triglycerides: 290  (08/10/2007)  Hypertension   Last Blood Pressure: 132 / 83  (12/14/2009)   Serum creatinine: 0.95  (10/29/2009)   Serum potassium 3.7  (10/29/2009)  Self-Management Support :    Personal Goals (by the next clinic visit) :     Personal A1C goal: 8  (09/14/2009)     Personal blood pressure goal: 130/80  (09/14/2009)     Personal LDL goal: 100  (09/14/2009)    Diabetes self-management support: Not documented    Diabetes self-management support not done because: Not indicated  (09/14/2009)    Hypertension self-management support: Not documented    Hypertension self-management support not done because: Good outcomes  (09/14/2009)

## 2010-05-23 NOTE — Progress Notes (Signed)
Summary: Records for appt tomorrow @ Urologist  Phone Note Call from Patient Call back at [facility] Phone [phone]   Summary of Call: needs [place] to fax OV to Dr. Brunilda Payor, [facility], for 9:30am appt tomorrow. Initial call taken by: Haydee Salter,  February 09, 2008 10:51 AM  Follow-up for Phone Call        faxed Follow-up by: ASHA BENTON LPN,  February 09, 2008 11:01 AM

## 2010-05-23 NOTE — Progress Notes (Signed)
Summary: Rx Req  Phone Note Call from Patient Call back at [facility] Phone [phone]   Caller: Patient Summary of Call: The med link nurse is asking for a diabetes kit be sent in to the [facility]. Initial call taken by: Clydell Hakim,  November 08, 2009 1:37 PM  Follow-up for Phone Call        spoke with patient and states she has never had a glucose moniter and the nurse with worker's comp is suggesting she get a moniter and strips . will forward message to Dr. Sandi Mealy. Follow-up by: Theresia Lo RN,  November 08, 2009 2:21 PM  Additional Follow-up for Phone Call Additional follow up Details #1::        as we just recently diagnosed diabetes we are working on drug therapy first.  we will slowly introduce test strips, etc in the near future.  will await an appt before doing this.   Additional Follow-up by: Ancil Boozer  MD,  November 12, 2009 8:39 AM    Additional Follow-up for Phone Call Additional follow up Details #2::    patient notified and she is advised to make appointment with Dr. Sandi Mealy around August 11 as MD had advised to return in one month from  10/29/2009. Follow-up by: Theresia Lo RN,  November 12, 2009 11:43 AM

## 2010-05-23 NOTE — Assessment & Plan Note (Signed)
Summary: f/u mon visit/eo   Vital Signs:  Patient profile:   60 year old female Height:      63.5 inches Weight:      214 pounds BMI:     37.45 BSA:     2.00 O2 Sat:      98 % on Room air Temp:     98.8 degrees F Pulse rate:   108 / minute BP sitting:   138 / 79  Vitals Entered By: Jone Baseman CMA (Sep 14, 2009 9:41 AM)  O2 Flow:  Room air CC: f/u swelling, labs, headache/shaking Is Patient Diabetic? Yes Did you bring your meter with you today? No Pain Assessment Patient in pain? no        Primary Care Provider:  Ancil Boozer  MD  CC:  f/u swelling, labs, and headache/shaking.  History of Present Illness: swelling has improved some.  reviewed labs and patient told that kidneys, liver, thyroid, blood counts, urine, and  heart all are WNL based on laboratory values however she did have high sugar reading and a subsequent A1C revealed the diagnosis of diabetes type 2.  she is not terribly surprised but wants to know how to help this.    today she is very upset and complaining about diability forms she is trying to fill out .  she states because of being upset she has fallen more, continues to have urinary incontinence, trouble walking and her shaking has returned.  she states her appt with her eye doctor has also gotten her more stressed out and made her head hurt worse and still "like my brain is being pulled out" because she states that her eye doctor thinks she's had mini strokes based on her optholomogic examination. she doesn't think smoking could have caused this and wants to know what to do if they are there on a scan.    Habits & Providers  Alcohol-Tobacco-Diet     Tobacco Status: current     Tobacco Counseling: to quit use of tobacco products     Cigarette Packs/Day: occ  Current Medications (verified): 1)  Dyazide 37.5-25 Mg Caps (Triamterene-Hctz) .... Take 1 Capsule By Mouth Once A Day 2)  Alprazolam 1 Mg Tabs (Alprazolam) .... By Mouth At Bedtime Per  Outside Doc 3)  Bromday 0.09 % Soln (Bromfenac Sodium) .Marland Kitchen.. 1 Drop L Eye Daily 4)  Omeprazole 20 Mg Cpdr (Omeprazole) .... Two Times A Day 5)  Besivance 0.6 % Susp (Besifloxacin Hcl) .Marland Kitchen.. 1 Drop Left Three Times A Day 6)  Flonase 50 Mcg/act Susp (Fluticasone Propionate) .... 2 Sprays Each Nostril Daily 7)  Cetirizine Hcl 10 Mg Tabs (Cetirizine Hcl) .... Daily 8)  Prednisolone Acetate 1 % Susp (Prednisolone Acetate) .... Eye Drops 1 Drop Both Eyes Qid 9)  Nystatin 100000 Unit/gm Powd (Nystatin) .... Apply To Lower Abdomen Two Times A Day As Needed. Disp 1 Bottle 10)  Hydromorphone Hcl 4 Mg Tabs (Hydromorphone Hcl) .... Two Times A Day Per Pain Clinic 11)  Topiramate 25 Mg Tabs (Topiramate) .... 2 Tabs Three Times A Day Per Outside Clinic 12)  Metformin Hcl 500 Mg Tabs (Metformin Hcl) .Marland Kitchen.. 1 By Mouth At Bedtime For 1wk Then 1 By Mouth 2x A Day For 1 Wk Then 1 in Am and 2 At Bedtime For 1 Wk Then 2 By Mouth 2x A Day 13)  Ensure  Liqd (Nutritional Supplements) .Marland Kitchen.. 1 Drink Two Times A Day As Needed.  Disp 1 Case. 14)  Adult Diapers .Marland KitchenMarland KitchenMarland Kitchen  As Needed.  Disp Enough For Three Times A Day Use For 1 Month.  Allergies (verified): No Known Drug Allergies  Past History:  Past medical, surgical, family and social histories (including risk factors) reviewed for relevance to current acute and chronic problems.  Past Medical History: Reviewed history from 08/14/2009 and no changes required. CATARACT EXTRACTIONS, BILATERAL, HX OF (ICD-V45.61) CHRONIC PAIN SYNDROME (ICD-338.4) DEPRESSION, MAJOR, WITH PSYCHOTIC BEHAVIOR (ICD-298.0) TOBACCO USER (ICD-305.1) CHRONIC INTERSTITIAL CYSTITIS (ICD-595.1) GASTROESOPHAGEAL REFLUX DISEASE, CHRONIC (ICD-530.81) URINARY INCONTINENCE (ICD-788.30) Hx of BENIGN POSITIONAL VERTIGO (ICD-386.11) HYPERTENSION, BENIGN ESSENTIAL (ICD-401.1)  Past Surgical History: Reviewed history from 06/18/2006 and no changes required. Cholecystectomy - 04/21/1997, tah and single  oophorectomy--fibroids - 04/22/1991  Family History: Reviewed history from 08/14/2009 and no changes required. cad--daugher--mi age 37, dm--sister  Social History: Reviewed history from 08/14/2009 and no changes required. Lives with son.; Smokes 1/2 pk cig/day for 20+ years.; No alcohol or drugsPacks/Day:  occ  Review of Systems       per hpi  Physical Exam  General:  alert, obese AAF in NAD but upset and tearful VS noted - mildly hypertensive, tachycardic.  normal O2 sat Extremities:  1+ bilateral lower extremity swelling Neurologic:  alert & oriented X3 and cranial nerves II-XII intact.  strength 5-/5 all extremities.  very tremulousness worsened with movement but seems slightly better than at previous visits.  sensation intact.  DTRs remain symmectrical and normal. finger to nose slow and difficult with tremor bilaterally.  negative rhomberg. Psych:  Oriented X3.  anxious appearing, easily angered with labile affect   Impression & Recommendations:  Problem # 1:  EDEMA (ICD-782.3) Assessment Improved  unclear etiology - ? fluid retention with not taking antihypertensives (BP better today which would suggest perhaps she has taken her med since last visit). ? from uncontrolled DM (new dx).  CBC, thyroid WNL.  BNP WNL.   Her updated medication list for this problem includes:    Dyazide 37.5-25 Mg Caps (Triamterene-hctz) .Marland Kitchen... Take 1 capsule by mouth once a day  Orders: [facility]- Est  Level 4 (16109)  Problem # 2:  HEADACHE (ICD-784.0) Assessment: Unchanged given duration, fact that it is associated with urinary incontinence, falls, ? memory problems will get MRI with contrast to help look for normal pressure hydrocephalous as well as some old infarcts.  ? if some infarts are frontal given patient's significant worsening of psychatric problems recnetly.  will await report. patient's creatinine monday was excellent so should not be an issue for contrast dye.  Her updated medication list  for this problem includes:    Hydromorphone Hcl 4 Mg Tabs (Hydromorphone hcl) .Marland Kitchen..Marland Kitchen Two times a day per pain clinic  Orders: MRI with Contrast (MRI w/Contrast) [facility]- Est  Level 4 (60454)  Problem # 3:  DIABETES MELLITUS, TYPE II (ICD-250.00) Assessment: New  new dx.  discussed that perhaps many of her symptoms (including urinary symptoms, swelling, headaches) could be related to uncontrolled sugar.  discused how to start metformin and taht we will plan to start it 2 days after she gets her mri with contrast.  will increase dosage very slowly given her psysh issues to try to avoid side effect problems.  discussed how metformin is weight neutral.  discussed what a1c was and that goal a1c is <7 and that i think this is easily achievable at this time likely just with full dose metformin.  she expressed understanding.  f/u 1 month to see how sugars are doing.  discussed that she doesn't need  meter at this point.  Her updated medication list for this problem includes:    Metformin Hcl 500 Mg Tabs (Metformin hcl) .Marland Kitchen... 1 by mouth at bedtime for 1wk then 1 by mouth 2x a day for 1 wk then 1 in am and 2 at bedtime for 1 wk then 2 by mouth 2x a day  Orders: [facility]- Est  Level 4 (16109)  Complete Medication List: 1)  Dyazide 37.5-25 Mg Caps (Triamterene-hctz) .... Take 1 capsule by mouth once a day 2)  Alprazolam 1 Mg Tabs (Alprazolam) .... By mouth at bedtime per outside doc 3)  Bromday 0.09 % Soln (Bromfenac sodium) .Marland Kitchen.. 1 drop l eye daily 4)  Omeprazole 20 Mg Cpdr (Omeprazole) .... Two times a day 5)  Besivance 0.6 % Susp (Besifloxacin hcl) .Marland Kitchen.. 1 drop left three times a day 6)  Flonase 50 Mcg/act Susp (Fluticasone propionate) .... 2 sprays each nostril daily 7)  Cetirizine Hcl 10 Mg Tabs (Cetirizine hcl) .... Daily 8)  Prednisolone Acetate 1 % Susp (Prednisolone acetate) .... Eye drops 1 drop both eyes qid 9)  Nystatin 100000 Unit/gm Powd (Nystatin) .... Apply to lower abdomen two times a day as  needed. disp 1 bottle 10)  Hydromorphone Hcl 4 Mg Tabs (Hydromorphone hcl) .... Two times a day per pain clinic 11)  Topiramate 25 Mg Tabs (Topiramate) .... 2 tabs three times a day per outside clinic 12)  Metformin Hcl 500 Mg Tabs (Metformin hcl) .Marland Kitchen.. 1 by mouth at bedtime for 1wk then 1 by mouth 2x a day for 1 wk then 1 in am and 2 at bedtime for 1 wk then 2 by mouth 2x a day 13)  Ensure Liqd (Nutritional supplements) .Marland Kitchen.. 1 drink two times a day as needed.  disp 1 case. 14)  Adult Diapers  .... As needed.  disp enough for three times a day use for 1 month.  Patient Instructions: 1)  Please follow up in 1 month. 2)  Get the MRI of your brain, I will call you if there is anything abnormal. 3)  Start the metformin 2 days after your MRI.  4)  Call with questions or concerns Prescriptions: ADULT DIAPERS as needed.  Disp enough for three times a day use for 1 month.  #1 x PRN   Entered and Authorized by:   Ancil Boozer  MD   Signed by:   Ancil Boozer  MD on 09/14/2009   Method used:   Print then Give to Patient   RxID:   [id number] ENSURE  LIQD (NUTRITIONAL SUPPLEMENTS) 1 drink two times a day as needed.  Disp 1 case.  #1 x PRN   Entered and Authorized by:   Ancil Boozer  MD   Signed by:   Ancil Boozer  MD on 09/14/2009   Method used:   Print then Give to Patient   RxID:   [id number] METFORMIN HCL 500 MG TABS (METFORMIN HCL) 1 by mouth at bedtime for 1wk then 1 by mouth 2x a day for 1 wk then 1 in AM and 2 at bedtime for 1 wk then 2 by mouth 2x a day  #120 x 1   Entered and Authorized by:   Ancil Boozer  MD   Signed by:   Ancil Boozer  MD on 09/14/2009   Method used:   Electronically to        [facility] (retail)       [address]  [place], [place]  [id number]       Ph: [phone]       Fax: [phone]   RxID:   [id number]   Prevention & Chronic Care Immunizations   Influenza vaccine: Fluvax 3+  (01/24/2008)   Influenza  vaccine due: 01/23/2009    Tetanus booster: 07/20/2005: Done.   Tetanus booster due: 07/21/2015    Pneumococcal vaccine: Not documented    H. zoster vaccine: Not documented  Colorectal Screening   Hemoccult: Not documented   Hemoccult action/deferral: Not indicated  (09/14/2009)    Colonoscopy: Done.  (08/19/2005)   Colonoscopy due: 08/20/2015  Other Screening   Pap smear: normal  (08/10/2007)   Pap smear action/deferral: Not indicated S/P hysterectomy  (11/13/2008)   Pap smear due: 08/09/2008    Mammogram: Done.  (07/21/1998)   Mammogram due: 07/21/1999    DXA bone density scan: Not documented  Reports requested:  Smoking status: current  (09/14/2009)   Smoking cessation counseling: yes  (06/14/2008)  Diabetes Mellitus   HgbA1C: 8.4  (09/10/2009)    Eye exam: Not documented   Last eye exam report requested.    Foot exam: Not documented   High risk foot: Not documented   Foot care education: Not documented    Urine microalbumin/creatinine ratio: Not documented    Diabetes flowsheet reviewed?: Yes   Progress toward A1C goal: Unchanged  Lipids   Total Cholesterol: 200  (08/10/2007)   LDL: 91  (08/10/2007)   LDL Direct: Not documented   HDL: 51  (08/10/2007)   Triglycerides: 290  (08/10/2007)  Hypertension   Last Blood Pressure: 138 / 79  (09/14/2009)   Serum creatinine: 0.69  (09/10/2009)   Serum potassium 4.0  (09/10/2009)    Hypertension flowsheet reviewed?: Yes   Progress toward BP goal: Improved  Self-Management Support :   Personal Goals (by the next clinic visit) :     Personal A1C goal: 8  (09/14/2009)     Personal blood pressure goal: 130/80  (09/14/2009)     Personal LDL goal: 100  (09/14/2009)    Diabetes self-management support: Not documented    Diabetes self-management support not done because: Not indicated  (09/14/2009)    Hypertension self-management support: Not documented    Hypertension self-management support not done because:  Good outcomes  (09/14/2009)    Self-management comments: new diagnosis of diabetes in setting of working out multiple other medical issues.  will slowly introduce concepts of diabetes management, refer to nutrition, etc. today just started with basic "what is diabetes" and how to take her new medicine with close follow up planned   Nursing Instructions: Request report of last diabetic eye exam

## 2010-05-23 NOTE — Miscellaneous (Signed)
Summary: Tobacco Jakylan Ron  Clinical Lists Changes  Problems: Added new problem of TOBACCO Rahmir Beever (ICD-305.1) 

## 2010-05-23 NOTE — Assessment & Plan Note (Signed)
Summary: F/U  KH   Vital Signs:  Patient profile:   60 year old female Weight:      191.3 pounds Pulse rate:   92 / minute BP sitting:   118 / 70  (right arm)  Vitals Entered By: Arlyss Repress CMA, (March 26, 2010 8:58 AM) CC: f/up last OV Is Patient Diabetic? Yes Pain Assessment Patient in pain? yes     Location: back/abdomen Intensity: 10   Primary Care Provider:  Helane Rima DO  CC:  f/up last OV.  History of Present Illness: 60 yo F:  1. HTN: Improved.  2. Abdominal Pain: Upcoming appointment with GI on 04/25/10 for reflux and constipation. Now, c/o of epigastric pain with radiation to right and left as well as back. Knawing sensation. + Nausea, improved with phenergan. No vomiting.   3. Weight Loss: Continues to lose 2/2 no appetite along with nausea now.  Habits & Providers  Alcohol-Tobacco-Diet     Tobacco Status: current     Tobacco Counseling: to quit use of tobacco products  Current Medications (verified): 1)  Adult Diapers .... As Needed.  Disp Enough For Three Times A Day Use For 1 Month. 2)  Onetouch Test  Strp (Glucose Blood) .... Dispense Quantity Sufficient For Twice Daily Blood Glucose Testing. 3)  Onetouch Delica Lancets  Misc (Lancets) .... Dispense Quantity Sufficient For Twice Daily Testing. 4)  Prozac 40 Mg Caps (Fluoxetine Hcl) .... One Daily 5)  Seroquel 200 Mg Tabs (Quetiapine Fumarate) .... 1/2 By Mouth Q Hs 6)  Ambien 10 Mg Tabs (Zolpidem Tartrate) .... One By Mouth Q Hs 7)  Nexium 40 Mg Cpdr (Esomeprazole Magnesium) .... Take 1 Tab Each Morning 8)  Lisinopril-Hydrochlorothiazide 20-25 Mg Tabs (Lisinopril-Hydrochlorothiazide) .... One Daily 9)  Meloxicam 15 Mg Tabs (Meloxicam) .... One By Mouth Daily 10)  Anusol-Hc 2.5 % Crea (Hydrocortisone) .... Per Box Instructions 11)  Polyethylene Glycol 3350  Powd (Polyethylene Glycol 3350) .Marland KitchenMarland KitchenMarland Kitchen 17 Gm in 4-6 Oz Water and Drink Daily, Qs 12)  Promethazine Hcl 25 Mg  Tabs (Promethazine Hcl) .... One  By Mouth Two Times A Day As Needed Nausea  Allergies (verified): 1)  Pristiq (Desvenlafaxine Succinate) PMH-FH-SH reviewed for relevance  Review of Systems General:  Complains of malaise and weight loss; denies chills and fever. CV:  Denies chest pain or discomfort, shortness of breath with exertion, and swelling of feet. GI:  Complains of abdominal pain, constipation, gas, indigestion, loss of appetite, and nausea; denies bloody stools, diarrhea, vomiting, and vomiting blood. GU:  Denies discharge and dysuria.  Physical Exam  General:  Well-developed, well-nourished, in no acute distress; alert. Vitals reviewed. Abdomen:  Soft, normal bowel sounds, no distention, no masses, and no rebound tenderness.  Generalized TTP, > epigastric to RUQ. Pulses:  2+ bilateral DP pulses. Extremities:  Trace edema. Psych:  Oriented X3 and memory intact for recent and remote.  More anxious today.   Impression & Recommendations:  Problem # 1:  ABDOMINAL PAIN, EPIGASTRIC (ICD-789.06) Assessment Deteriorated Concerning for gastritis vs gallbladder vs pancreas. Will order labs below.  Will change PPI to Nexium per patient request. May use Tums/other coating agent OTC. No red flags today. Upcoming appointment with GI, would likely need EGD.  Orders: Comp Met-[facility] [phone]) Lipid-[facility] [phone]) CBC w/Diff-[facility] (29562) Lipase-[facility] [phone]) [facility]- Est  Level 4 (96295)  Problem # 2:  LOSS OF WEIGHT (ICD-783.21) Assessment: Deteriorated See #1. Orders: HIV-[facility] ([id number]) [facility]- Est  Level 4 (02725)  Problem # 3:  DIABETES MELLITUS, TYPE II (ICD-250.00) Assessment: Unchanged  I continue to hold medication 2/2 weight loss. No Metformin 2/2 previous GI side-effects, so need to consider possible hypOglycemia. Will add sulfonylurea when more stable. A1c 8.5 on 02/2010.  The following medications were removed from the medication list:    Lisinopril 20 Mg Tabs (Lisinopril) .Marland Kitchen... 1 tablet by mouth  daily Her updated medication list for this problem includes:    Lisinopril-hydrochlorothiazide 20-25 Mg Tabs (Lisinopril-hydrochlorothiazide) ..... One daily  Orders: [facility]- Est  Level 4 (11914)  Problem # 4:  HYPERTENSION, BENIGN ESSENTIAL (ICD-401.1) Assessment: Unchanged  At goal today on Lisinopril 20/HCTZ 25 (patient never started extra Lisinopril). The following medications were removed from the medication list:    Lisinopril 20 Mg Tabs (Lisinopril) .Marland Kitchen... 1 tablet by mouth daily Her updated medication list for this problem includes:    Lisinopril-hydrochlorothiazide 20-25 Mg Tabs (Lisinopril-hydrochlorothiazide) ..... One daily  Orders: [facility]- Est  Level 4 (78295)  Complete Medication List: 1)  Adult Diapers  .... As needed.  disp enough for three times a day use for 1 month. 2)  Onetouch Test Strp (Glucose blood) .... Dispense quantity sufficient for twice daily blood glucose testing. 3)  Onetouch Delica Lancets Misc (Lancets) .... Dispense quantity sufficient for twice daily testing. 4)  Prozac 40 Mg Caps (Fluoxetine hcl) .... One daily 5)  Seroquel 200 Mg Tabs (Quetiapine fumarate) .... 1/2 by mouth q hs 6)  Ambien 10 Mg Tabs (Zolpidem tartrate) .... One by mouth q hs 7)  Nexium 40 Mg Cpdr (Esomeprazole magnesium) .... Take 1 tab each morning 8)  Lisinopril-hydrochlorothiazide 20-25 Mg Tabs (Lisinopril-hydrochlorothiazide) .... One daily 9)  Meloxicam 15 Mg Tabs (Meloxicam) .... One by mouth daily 10)  Anusol-hc 2.5 % Crea (Hydrocortisone) .... Per box instructions 11)  Polyethylene Glycol 3350 Powd (Polyethylene glycol 3350) .Marland KitchenMarland Kitchen. 17 gm in 4-6 oz water and drink daily, qs 12)  Promethazine Hcl 25 Mg Tabs (Promethazine hcl) .... One by mouth two times a day as needed nausea  Patient Instructions: 1)  It was nice to see you today! 2)  For your nausea: Phenergen. 3)  Follow up in 1 week. Prescriptions: PROMETHAZINE HCL 25 MG  TABS (PROMETHAZINE HCL) one by mouth two times a  day as needed nausea  #30 x 0   Entered and Authorized by:   Helane Rima DO   Signed by:   Helane Rima DO on 03/26/2010   Method used:   Print then Give to Patient   RxID:   [id number] NEXIUM 40 MG CPDR (ESOMEPRAZOLE MAGNESIUM) Take 1 tab each morning  #30 x 2   Entered and Authorized by:   Helane Rima DO   Signed by:   Helane Rima DO on 03/26/2010   Method used:   Print then Give to Patient   RxID:   [id number]    Orders Added: 1)  Comp Met-[facility] [[id number]] 2)  Lipid-[facility] [[id number]] 3)  CBC w/Diff-[facility] [85025] 4)  Lipase-[facility] [[id number]] 5)  HIV-[facility] [[id number]] 6)  [facility]- Est  Level 4 [16606]    Prevention & Chronic Care Immunizations   Influenza vaccine: Fluvax Non-MCR  (03/04/2010)   Influenza vaccine due: 01/23/2009    Tetanus booster: 07/20/2005: Done.   Tetanus booster due: 07/21/2015    Pneumococcal vaccine: Not documented    H. zoster vaccine: Not documented  Colorectal Screening   Hemoccult: Not documented   Hemoccult action/deferral: Not indicated  (09/14/2009)    Colonoscopy: Done.  (08/19/2005)   Colonoscopy due:  08/20/2015  Other Screening   Pap smear: normal  (08/10/2007)   Pap smear action/deferral: Not indicated S/P hysterectomy  (11/13/2008)   Pap smear due: 08/09/2008    Mammogram: Done.  (07/21/1998)   Mammogram due: 07/21/1999    DXA bone density scan: Not documented   Smoking status: current  (03/26/2010)   Smoking cessation counseling: yes  (06/14/2008)  Diabetes Mellitus   HgbA1C: 8.5  (03/04/2010)    Eye exam: Not documented    Foot exam: yes  (12/07/2009)   High risk foot: Not documented   Foot care education: Not documented    Urine microalbumin/creatinine ratio: Not documented    Diabetes flowsheet reviewed?: Yes   Progress toward A1C goal: Unchanged  Lipids   Total Cholesterol: 200  (08/10/2007)   LDL: 91  (08/10/2007)   LDL Direct: Not documented   HDL: 51  (08/10/2007)   Triglycerides: 290   (08/10/2007)  Hypertension   Last Blood Pressure: 118 / 70  (03/26/2010)   Serum creatinine: 0.70  (01/28/2010)   Serum potassium 4.3  (01/28/2010) CMP ordered     Hypertension flowsheet reviewed?: Yes   Progress toward BP goal: At goal  Self-Management Support :   Personal Goals (by the next clinic visit) :     Personal A1C goal: 8  (09/14/2009)     Personal blood pressure goal: 130/80  (09/14/2009)     Personal LDL goal: 100  (09/14/2009)    Patient will work on the following items until the next clinic visit to reach self-care goals:     Medications and monitoring: take my medicines every day, bring all of my medications to every visit  (03/26/2010)     Eating: drink diet soda or water instead of juice or soda, eat more vegetables, use fresh or frozen vegetables, eat foods that are low in salt, eat baked foods instead of fried foods, eat fruit for snacks and desserts, limit or avoid alcohol  (03/26/2010)     Activity: take a 30 minute walk every day  (03/12/2010)    Diabetes self-management support: Written self-care plan  (03/26/2010)   Diabetes care plan printed    Diabetes self-management support not done because: Not indicated  (09/14/2009)    Hypertension self-management support: Written self-care plan  (03/26/2010)   Hypertension self-care plan printed.    Hypertension self-management support not done because: Good outcomes  (09/14/2009)

## 2010-05-23 NOTE — Assessment & Plan Note (Signed)
Summary: History and Physical   Vital Signs:  Patient profile:   60 year old female O2 Sat:      100 % on 2 L/min Pulse rate:   100 / minute Resp:     18 per minute BP supine:   141 / 66  Primary Tashia Leiterman:  Helane Rima DO  CC:  Nausea, vomiting, and abd pain. Marland Kitchen  History of Present Illness: 60 yo AAF with hx of HTN, IC, hemorrhoids and chronic pain presents with abdominal pain that has been worsening for several weeks. This pain is diffuse, radiates from epigastrium superiorly to chest and posteriorly to her back. For the past 3 days, she has had thick, white emesis and been unable to eat. She also endorses recent distention of her abdomen, has felt feverish at times, and has been having occasional black tarry stool for past 2 years. No prior hx of this pain. She was recently seen in clinic for this pain, and had f/u scheduled in early Jan. with gastroenterologist; however she was unable to stand her symptoms while waiting for appt. The labs drawn in clinic were wnl with the exception of lipase elevated to 160. She denies alcohol, drug use. Has taken excedrin for pain. Hx of cholecystectomy in 1999. Has known hemorrhoids with remote hx of bright red blood and pain with straining.  In the ED, found again to have elevated lipase and CT evidence of duodenal/pancreatic stranding concerning for PUD vs. pancreatitis. Administered total of 6mg  IV dilaudid over 4 hours, and 1L NS bolus.  Allergies: 1)  Pristiq (Desvenlafaxine Succinate)  Past History:  Past Medical History: Last updated: 11/14/2009 CATARACT EXTRACTIONS, BILATERAL, HX OF (ICD-V45.61) CHRONIC PAIN SYNDROME (ICD-338.4) DEPRESSION, MAJOR, WITH PSYCHOTIC BEHAVIOR (ICD-298.0) - ? somatization disorder as well TOBACCO USER (ICD-305.1) CHRONIC INTERSTITIAL CYSTITIS (ICD-595.1) GASTROESOPHAGEAL REFLUX DISEASE, CHRONIC (ICD-530.81) URINARY INCONTINENCE (ICD-788.30) Hx of BENIGN POSITIONAL VERTIGO (ICD-386.11) HYPERTENSION, BENIGN  ESSENTIAL (ICD-401.1) Grade 1 diastolic dysfunction.  EF 60-65% July 2011  Past Surgical History: Last updated: 06/18/2006 Cholecystectomy - 04/21/1997, tah and single oophorectomy--fibroids - 04/22/1991  Family History: Last updated: 08/14/2009 cad--daugher--mi age 71, dm--sister  Social History: Last updated: 09/25/2009 Lives with son Aurther Loft).; Smokes 1/2 pk cig/day for 20+ years.; No alcohol or drugs  sister of Jeannine Kitten [facility] patient)  Risk Factors: Smoking Status: current (03/26/2010) Packs/Day: 0.25 (02/06/2010)  Review of Systems       The patient complains of anorexia, abdominal pain, melena, and severe indigestion/heartburn.  The patient denies peripheral edema, headaches, and hemoptysis.    Physical Exam  General:  T 98.3, P 100, R 18, O2 sat 100% on 2L, BP 144/66 Mild distress, sommolent, fluctuating attention span. Head:  normocephalic and atraumatic.   Eyes:  EOMI, PERRLA Mouth:  Oral mucosa and oropharynx without lesions or exudates.  Teeth in good repair. mucus membranes appear mildly dry.  Lungs:  Lungs clear to auscultation bilaterally. Pain with deep inspiration. Heart:  II/VI systolic murmur. Tachy Abdomen:  Moderately distended, BS diminished. No rebound tenderness, diffusely tender with voluntary guarding.  Pulses:  2+ bilateral DP pulses Extremities:  no edema Neurologic:  No cranial nerve deficits noted. Sensory, motor and coordinative functions appear intact.  Skin:  Intact without suspicious lesions or rashes Additional Exam:  Sodium (NA)    134       Potassium (K)    4.1      Chloride   100       CO2      23  Glucose    206     BUN      13       Creatinine    1.31     GFR, Est African American    50         Bilirubin, Total      0.5         Alkaline Phosphatase   94       SGOT (AST)     29       SGPT (ALT)     24     Total  Protein     8.3          Albumin-Blood     4.4     Calcium    9.5  Lipase 132  UA: negative, sp grav  1.010 WBC  8.6   RBC   5.08    Hemoglobin (HGB)      14.5    Hematocrit (HCT)    42.5  EKG: sinus tachycardia  CT Abd/pelvis:  Slight stranding along the descending duodenum, possibly a   manifestation of peptic ulcer disease or less likely pancreatitis.   Correlate with pancreatic enzyme levels.   2.  Bibasilar subsegmental atelectasis.   3.  Diffuse hepatic steatosis.   4.  Small hiatal hernia.   5.  Ventral hernia contains a loop of transverse colon without   specific findings of strangulation or obstruction.   6.  Small umbilical hernia contains adipose tissue.   7.  Prior left pelvic fracture.   8.  Lumbar scoliosis with lower lumbar epidural lipomatosi    Impression & Recommendations:  Problem # 1:  ABDOMINAL PAIN, EPIGASTRIC (ICD-789.06) Likely pancreatitis and/or PUD given chronicity of complaint and CT findings. No hx to support EtOH-induced, recent FLP with only mildly elevated TG (230), and remote hx of cholecystectomy. She does endorse long-term use of NSAIDs. Plan to consult GI nonemergently as this patient may benefit from ERCP or upper/lower endoscopy given hx of bleeding.  Ranson score of 2 currently (LDH pending).  Will treat symptomatically and obtain LDH and am labs. Will keep NPO due to severe nausea/vomitting and allow ice chips. She seemed to be oversedated during history, therefore she was administered narcan in the ED. She did recover her attention span rather quickly after this. We will start IV Morphine as needed for pain. Also start IV protonix and phenergan.   Problem # 2:  ARF Baseline cr 0.7-0.9 and now is elevated at 1.3. Likely prerenal, will start MIVF with D51/2NS at 125 cc/hr after 1L bolus in ED. Will f/u with am labs and monitor response to hydration.   Problem # 3:  HYPERTENSION, BENIGN ESSENTIAL (ICD-401.1) Will hold her antihypertensives as patient had transient hypotension due to IV narcotics. May restart once patient stabilizes if it is felt  her pancreatitis not to be medication induced. Labetolol as needed for SBP>180.  Her updated medication list for this problem includes:    Lisinopril-hydrochlorothiazide 20-25 Mg Tabs (Lisinopril-hydrochlorothiazide) ..... One daily  Problem # 4:  DIABETES MELLITUS, TYPE II (ICD-250.00) Not taking oral antiglycemics currently. CBG moderately elevated to 200s. Recent a1c was 8.5. Will cover with SSI for now, she will likely need to restart oral antiglycemic agent prior to DC.  Her updated medication list for this problem includes:    Lisinopril-hydrochlorothiazide 20-25 Mg Tabs (Lisinopril-hydrochlorothiazide) ..... One daily  Problem # 5:  DEPRESSION, MAJOR, WITH PSYCHOTIC BEHAVIOR (ICD-298.0) Will continue her home psychotropic meds to avoid  withdrawl reactions. Seroquel and prozac.  Problem # 6:  FEN Recent fluid deficit due to emesis/decreased intake. Tachycardia has improved with bolus fluids. Will continue maintenance IVF D51/2NS @125cc /hr. Mild hyponatremia, will follow with am BMET. Colace for constipation.   Problem # 7:  PPX Will avoid chemoprophylaxis in setting of likely chronic GI bleed. SCDs for VTE ppx. Protonix daily.   Problem # 8:  Dispo Pending clinical improvement. Full Code.   Complete Medication List: 1)  Adult Diapers  .... As needed.  disp enough for three times a day use for 1 month. 2)  Onetouch Test Strp (Glucose blood) .... Dispense quantity sufficient for twice daily blood glucose testing. 3)  Onetouch Delica Lancets Misc (Lancets) .... Dispense quantity sufficient for twice daily testing. 4)  Prozac 40 Mg Caps (Fluoxetine hcl) .... One daily 5)  Seroquel 200 Mg Tabs (Quetiapine fumarate) .... 1/2 by mouth q hs 6)  Ambien 10 Mg Tabs (Zolpidem tartrate) .... One by mouth q hs 7)  Nexium 40 Mg Cpdr (Esomeprazole magnesium) .... Take 1 tab each morning 8)  Lisinopril-hydrochlorothiazide 20-25 Mg Tabs (Lisinopril-hydrochlorothiazide) .... One daily 9)  Meloxicam  15 Mg Tabs (Meloxicam) .... One by mouth daily 10)  Anusol-hc 2.5 % Crea (Hydrocortisone) .... Per box instructions 11)  Polyethylene Glycol 3350 Powd (Polyethylene glycol 3350) .Marland KitchenMarland Kitchen. 17 gm in 4-6 oz water and drink daily, qs 12)  Promethazine Hcl 25 Mg Tabs (Promethazine hcl) .... One by mouth two times a day as needed nausea      Addendum Admission H& P by R3 FMTS  cc: nausea, vomiting, abd pain hpi:  60 y/o F with history of GERD, Depression, Diabetes presents to ER for worsening nausea, vomiting, abd pain.  Nonbloody emesis started 3 days ago, but she has had abdominal pain for several months.  She has not been able to keep anything down, but she has been trying to take Ensure instead.  She denies any fever, chills, cough, chest pain, dyspnea, diarrhea.  She has had BMs, but the last one was a couple of days ago.  She stays constipated usually.  She does endorse intermitten dark tarry stools dating back at least 2 yrs.  She has had CT abd/pelvis that showed possible pancreatitis with elevated lipase.  Pt was seen at [facility] clinic on 02/24/10 with similar complaints and at that time lipase was 168.  In the ER she was given a total of 5mg  of Dilaudid to help control her pain and when we saw pt she was very sommolent with slow to repsond to verbal commands.  She was also hypotensive with BP in the 80s -115.  We gave pt Naloxone 0.4mg  IV x 1 and she quickly repsonded to this and became more alert.  Her BP responded to the 130s.    PE:  VS  T 98.3, HR 100, RR 18, BP 141/66 Gen: very sommolent, no distress.  After Naloxone pt more alert, responsive, and mildly distressed. HEENT: [place]/AT, EOMI, Dry MM, Neck full ROM no masses CVS: tachy, 2/6 systolic murmur Resp: CTA b/l, no wheezing, crackles Abd: non distended, soft, hypoactive bowel sounds, nontender Ext: No edem Pulses: +2 Neuro: non focal  Labs/Studies: 1. Cmet: Na 134, K 4.1, Cl 100, CO2 23, Gluc 206, BUN 13, Cr 1.31, ALP 94, AST 29, ALT 24,  TP 8.3, Albumin 4.4, Ca 9.5  2. Lipase 132 3. UA: neg, spec grav 1.010 4. CBC: 8.6 > 14.5 / 42.5 < 174 5.  EKG:  sinus tachycardia, 120s 6. CT Abd/pelvis:  Slight stranding along the descending duodenum, possibly a   manifestation of peptic ulcer disease or less likely pancreatitis.   Correlate with pancreatic enzyme levels.   2.  Bibasilar subsegmental atelectasis.   3.  Diffuse hepatic steatosis.   4.  Small hiatal hernia.   5.  Ventral hernia contains a loop of transverse colon without   specific findings of strangulation or obstruction.   6.  Small umbilical hernia contains adipose tissue.   7.  Prior left pelvic fracture.   8.  Lumbar scoliosis with lower lumbar epidural lipomatosi  A/P: 60 y/o F with nausea, vomiting, abd pain likely from pancreatitis 1.  Pancreatitis:  CT abd/pelvis shows stranding likely from pancreaitis, elevated lipase to 132 (168 in clinic 02/24/10).  We will admit and keep pt NPO with IVF resusitation. Etiology of pancreatitis is unclear as pt had cholecystectomy in 1999 and denies any alcohol use.  Other causes of pancreatitis include ACEi or Thiazide, both of which the pt is on.  We will hold these medications.  Hypertriglyceremia may also be a cause but her FLP in 02/2010 showed Trig 228, which would not likely cause pancreatitis.  We will admit her to telemetry overnight because she was given high dose of Dilaudid in ED and was very sommolent.  We may likely be abe to discontinue telemetry tomorrow.  Will give Morphine as needed pain.  We will consult GI in AM for further as pt is to have appt with Dr Loreta Ave on 04/25/09.    2.  Hyponatremia: mild, should respond well to IVF  3.  Dark tarry stool per history:  Hb stable today and pt denies recent dark stools.  We will likely consult GI while pt is in hospital for further workup.  Pt may need colonscopy.  Will not give ASA or Heparin.    4.  Diabetes:  Pt was taking Metformin, but this was d/c by pcp when she  developed abd complaints.  We will continue to hold these med and place pt on SSI.  5.  HTN: pt has be on the hypotensive side during ER stay and her BP improved after Naloxone.  We will hold her antihypertensive and give Labetalol as needed SBP >180 or DBP > 110.  6.  Tachycardia:  sinus tach on EKG. Likely 2/2 to pain.  May consider checking TSH.  7.  Depression: continue home meds  8.  FEN/GI: d5 1/2 NS @125  cc/hr, NPO  9.  Prophylaxis: SCDs  10. Disposition: Clinical improvement.   Cat Ta MD  March 29, 2010 12:36 AM

## 2010-05-23 NOTE — Consult Note (Signed)
Summary: B cataract surg. [facility]   Imported By: Bradly Bienenstock 07/04/2009 17:17:09  _____________________________________________________________________  External Attachment:    Type:   Image     Comment:   External Document

## 2010-05-23 NOTE — Progress Notes (Signed)
Summary: phn msg  Phone Note Call from Patient Call back at [facility] Phone [phone]   Caller: Patient Summary of Call: Pt wondering if she can results over phone concerning echo? Initial call taken by: Clydell Hakim,  November 26, 2009 2:46 PM  Follow-up for Phone Call        called to let her know that overall her echo looks okay.  it shows that her heart is not relaxing well from her HTN but otherwise looks okay.   Gave her the name of her new PCP.  Follow-up by: Ancil Boozer  MD,  November 26, 2009 3:30 PM

## 2010-05-23 NOTE — Progress Notes (Signed)
Summary: phone msg  Phone Note Call from Patient Call back at Work Phone [phone]   Caller: Patient Summary of Call: needs to know when the appt for Laurette Schimke is.  please call back after 2pm at work Initial call taken by: De Nurse,  February 21, 2008 11:40 AM    Pt. contacted and given appt. of 11-11 at 10:00 with Dr. Arlyce Dice as noted on GI order. ANITA HERBERT RN  February 21, 2008 2:15 PM

## 2010-05-23 NOTE — Assessment & Plan Note (Signed)
Summary: f/u/kh   Vital Signs:  Patient profile:   60 year old female Height:      63.5 inches Weight:      215 pounds BMI:     37.62 BSA:     2.01 Temp:     98.8 degrees F Pulse rate:   112 / minute BP sitting:   146 / 78  Vitals Entered By: Jone Baseman CMA (October 29, 2009 9:46 AM) CC: f/u multiple complaints Is Patient Diabetic? No Pain Assessment Patient in pain? yes     Location: back and legs Intensity: 9   Primary Care Provider:  Ancil Boozer  MD  CC:  f/u multiple complaints.  History of Present Illness: multiple complaints: several concerns today: reports that the swelling is a little better in her legs but her toes feel numb partiuclarly the great toes bilaterally.  also her vision and head are "foggy".  she has no appetitie or energy and often will only eat or drink water and kool-aid.  her fingers are painful around the nails on both hands.  her back is painful.  she feels weak all over.  she felt sick to her stomach on pristiq and so stopped taking it. she denies fevers.  she didnt' complain specifically of headache today.  she reports she just feels "tired".  she notes she wakes up every morning with mucus and congestion in her lungs and that she has been out of her flonase so hasn't been taking it.   Habits & Providers  Alcohol-Tobacco-Diet     Tobacco Status: current     Tobacco Counseling: to quit use of tobacco products     Cigarette Packs/Day: 0.25  Current Medications (verified): 1)  Dyazide 37.5-25 Mg Caps (Triamterene-Hctz) .... Take 1 Capsule By Mouth Once A Day 2)  Alprazolam 1 Mg Tabs (Alprazolam) .... By Mouth At Bedtime Per Outside Doc 3)  Bromday 0.09 % Soln (Bromfenac Sodium) .Marland Kitchen.. 1 Drop L Eye Daily 4)  Omeprazole 20 Mg Cpdr (Omeprazole) .... Two Times A Day 5)  Besivance 0.6 % Susp (Besifloxacin Hcl) .Marland Kitchen.. 1 Drop Left Three Times A Day 6)  Flonase 50 Mcg/act Susp (Fluticasone Propionate) .... 2 Sprays Each Nostril Daily 7)  Cetirizine Hcl  10 Mg Tabs (Cetirizine Hcl) .... Daily 8)  Prednisolone Acetate 1 % Susp (Prednisolone Acetate) .... Eye Drops 1 Drop Both Eyes Qid 9)  Nystatin 100000 Unit/gm Powd (Nystatin) .... Apply To Lower Abdomen Two Times A Day As Needed. Disp 1 Bottle 10)  Hydromorphone Hcl 4 Mg Tabs (Hydromorphone Hcl) .... Two Times A Day Per Pain Clinic 11)  Topiramate 25 Mg Tabs (Topiramate) .... 2 Tabs Three Times A Day Per Outside Clinic 12)  Metformin Hcl 500 Mg Tabs (Metformin Hcl) .Marland Kitchen.. 1 By Mouth At Bedtime For 1wk Then 1 By Mouth 2x A Day For 1 Wk Then 1 in Am and 2 At Bedtime For 1 Wk Then 2 By Mouth 2x A Day 13)  Ensure  Liqd (Nutritional Supplements) .Marland Kitchen.. 1 Drink Two Times A Day As Needed.  Disp 1 Case. 14)  Adult Diapers .... As Needed.  Disp Enough For Three Times A Day Use For 1 Month. 15)  Triamcinolone Acetonide 0.5 % Crea (Triamcinolone Acetonide) .... Apply To Itchy Areas Two Times A Day As Needed.  Disp 60g Tube 16)  Furosemide 20 Mg Tabs (Furosemide) .Marland Kitchen.. 1 By Mouth Qam For Excess Fluid 17)  Mupirocin 2 % Oint (Mupirocin) .... Apply As Needed To Fingers.  Disp 22g Tube  Allergies (verified): 1)  Pristiq (Desvenlafaxine Succinate)  Past History:  Past medical, surgical, family and social histories (including risk factors) reviewed for relevance to current acute and chronic problems.  Past Medical History: CATARACT EXTRACTIONS, BILATERAL, HX OF (ICD-V45.61) CHRONIC PAIN SYNDROME (ICD-338.4) DEPRESSION, MAJOR, WITH PSYCHOTIC BEHAVIOR (ICD-298.0) - ? somatization disorder as well TOBACCO USER (ICD-305.1) CHRONIC INTERSTITIAL CYSTITIS (ICD-595.1) GASTROESOPHAGEAL REFLUX DISEASE, CHRONIC (ICD-530.81) URINARY INCONTINENCE (ICD-788.30) Hx of BENIGN POSITIONAL VERTIGO (ICD-386.11) HYPERTENSION, BENIGN ESSENTIAL (ICD-401.1)  Past Surgical History: Reviewed history from 06/18/2006 and no changes required. Cholecystectomy - 04/21/1997, tah and single oophorectomy--fibroids - 04/22/1991  Family  History: Reviewed history from 08/14/2009 and no changes required. cad--daugher--mi age 39, dm--sister  Social History: Reviewed history from 09/25/2009 and no changes required. Lives with son Aurther Loft).; Smokes 1/2 pk cig/day for 20+ years.; No alcohol or drugs  sister of Jeannine Kitten [facility] patient)Packs/Day:  0.25  Review of Systems       per HPI  Physical Exam  General:  alert, obese AAF in NAD but anxious VS noted - tachycardic. Head:  normocephalic and atraumatic.   Eyes:  vision grossly intact, pupils equal, pupils round, pupils reactive to light, and no injection.  EOMI Ears:  no external deformities.   Nose:  no external deformity.   Mouth:  MMM Neck:  No deformities, masses, or tenderness noted. Lungs:  Normal respiratory effort, chest expands symmetrically. Lungs are clear to auscultation, no crackles or wheezes. Heart:  Distant S1 and S2.  No mumurs noted Abdomen:  soft, non-tender, and normal bowel sounds.   Msk:  gait improved - still using walker but able to move much more quickly Extremities:  several fingers with some thickening of the cuticle along medial and lateral edges.  no frank infection at this time noted.  great toes with thickenend ingrown toenails. no infection noted.  sensation to light touch intact Neurologic:  alert & oriented X3 and cranial nerves II-XII intact.  strength 5-/5 all extremities.  mildly tremulousness worsened with movement but seems slightly better than at previous visits.  sensation intact.    Impression & Recommendations:  Problem # 1:  EDEMA (ICD-782.3) Assessment Improved improved with use if as needed diuretic (if she is taking it as reported) f/u BMet today,  ECHO scheduled for later this month.  Her updated medication list for this problem includes:    Dyazide 37.5-25 Mg Caps (Triamterene-hctz) .Marland Kitchen... Take 1 capsule by mouth once a day    Furosemide 20 Mg Tabs (Furosemide) .Marland Kitchen... 1 by mouth qam for excess  fluid  Orders: Basic Met-[facility] ([address]30-16010) [facility]- Est  Level 4 (93235)  Problem # 2:  DEPRESSION, MAJOR, WITH PSYCHOTIC BEHAVIOR (ICD-298.0) Assessment: Deteriorated  I suspect this is the cause for most of her complaints.  this is not under adequate control at this time but will defer to psychiatrist to whom she is already established.  many of her complaints are classic symptoms of depression that is not under control.  i question her complaince with her other medications as well and so i am concerned she will also not be complaint with her psychiatric meds or find reasons that they will not work (such as GI upset).   there are no obvious findings on examination to suggest another process is going on.   Orders: [facility]- Est  Level 4 (99214)  Problem # 3:  PAIN IN SOFT TISSUES OF LIMB (ICD-729.5) (fingers) Assessment: Unchanged  mupirocin as needed as i feel this  will be benign and as i feel oral antibiotics are not.  i do not suspect infection of fingers at this time.   Orders: [facility]- Est  Level 4 (60454)  Problem # 4:  DIABETES MELLITUS, TYPE II (ICD-250.00) Assessment: Comment Only i have given her the phone number to call [facility] to have her feet evaluated given her diabetes and toenails.    Her updated medication list for this problem includes:    Metformin Hcl 500 Mg Tabs (Metformin hcl) .Marland Kitchen... 1 by mouth at bedtime for 1wk then 1 by mouth 2x a day for 1 wk then 1 in am and 2 at bedtime for 1 wk then 2 by mouth 2x a day  Complete Medication List: 1)  Dyazide 37.5-25 Mg Caps (Triamterene-hctz) .... Take 1 capsule by mouth once a day 2)  Alprazolam 1 Mg Tabs (Alprazolam) .... By mouth at bedtime per outside doc 3)  Bromday 0.09 % Soln (Bromfenac sodium) .Marland Kitchen.. 1 drop l eye daily 4)  Omeprazole 20 Mg Cpdr (Omeprazole) .... Two times a day 5)  Besivance 0.6 % Susp (Besifloxacin hcl) .Marland Kitchen.. 1 drop left three times a day 6)  Flonase 50 Mcg/act Susp (Fluticasone propionate) .... 2 sprays  each nostril daily 7)  Cetirizine Hcl 10 Mg Tabs (Cetirizine hcl) .... Daily 8)  Prednisolone Acetate 1 % Susp (Prednisolone acetate) .... Eye drops 1 drop both eyes qid 9)  Nystatin 100000 Unit/gm Powd (Nystatin) .... Apply to lower abdomen two times a day as needed. disp 1 bottle 10)  Hydromorphone Hcl 4 Mg Tabs (Hydromorphone hcl) .... Two times a day per pain clinic 11)  Topiramate 25 Mg Tabs (Topiramate) .... 2 tabs three times a day per outside clinic 12)  Metformin Hcl 500 Mg Tabs (Metformin hcl) .Marland Kitchen.. 1 by mouth at bedtime for 1wk then 1 by mouth 2x a day for 1 wk then 1 in am and 2 at bedtime for 1 wk then 2 by mouth 2x a day 13)  Ensure Liqd (Nutritional supplements) .Marland Kitchen.. 1 drink two times a day as needed.  disp 1 case. 14)  Adult Diapers  .... As needed.  disp enough for three times a day use for 1 month. 15)  Triamcinolone Acetonide 0.5 % Crea (Triamcinolone acetonide) .... Apply to itchy areas two times a day as needed.  disp 60g tube 16)  Furosemide 20 Mg Tabs (Furosemide) .Marland Kitchen.. 1 by mouth qam for excess fluid 17)  Mupirocin 2 % Oint (Mupirocin) .... Apply as needed to fingers.  disp 22g tube  Patient Instructions: 1)  Please follow up with Dr Sandi Mealy in 1 month. 2)  Be sure to keep your appt with psychiatry to talk about your mood - I think this is the biggest thing causing your pain and symptoms.  3)  For your congestion - use the flonase regularly 4)  For your fingers- - use the bactroban as needed 5)  Be sure to keep your appt for your echocardiogram. Prescriptions: FLONASE 50 MCG/ACT SUSP (FLUTICASONE PROPIONATE) 2 sprays each nostril daily  #1 x 3   Entered and Authorized by:   Ancil Boozer  MD   Signed by:   Ancil Boozer  MD on 10/29/2009   Method used:   Electronically to        [facility] [address] (retail)       [address]       [place], [place]  [id number]       Ph:  [phone]       Fax: [phone]   RxID:   [id number] MUPIROCIN 2 % OINT  (MUPIROCIN) apply as needed to fingers.  Disp 22g tube  #22 x 1   Entered and Authorized by:   Ancil Boozer  MD   Signed by:   Ancil Boozer  MD on 10/29/2009   Method used:   Electronically to        [facility] (retail)       [address]       [place], [place]  [id number]       Ph: [phone]       Fax: [phone]   RxID:   [id number]

## 2010-05-23 NOTE — Assessment & Plan Note (Signed)
Summary: f/up,tcb   Vital Signs:  Patient profile:   60 year old female Height:      63.5 inches Weight:      202 pounds BMI:     35.35 Temp:     99.1 degrees F oral Pulse rate:   97 / minute BP sitting:   150 / 83  (left arm) Cuff size:   large  Vitals Entered By: Tessie Fass CMA (February 21, 2010 10:44 AM) CC: f/u multiple issues Is Patient Diabetic? Yes Pain Assessment Patient in pain? yes        Primary Care Provider:  Helane Rima DO  CC:  f/u multiple issues.  History of Present Illness: 60 yo F; please see previous notes for more details: Patient presented with her grandson today.  1. Anxiety/Insomnia: SEVERE but IMPROVED with Seroquel q hs. Sleeping better. Patient crying less during this visit than last week. She denies SI/HI. We were weaning her off of Clonidine since she endorsed s/e, but her psychiatrist asked her to remain on the medication. Patient had 2 different bottles of same medication, but they look different, so she has been taking both = Clonidine 0.2 mg by mouth daily.   2. Abdominal Pain: Hx GERD, gastritis, and hiatal hernia. Rx Omeprazole. Patient c/o epigastric tenderness with radiation to both sides and back, associated with nausea, sour taste in throat, and pain in her throat. No SOB, wheeze, melena, BRBPR, vomiting. She has decreased appetite and a "burning feeling in her mouth." Hx thrush - took Nystatin.   3. Back Pain/Debility: Chronic issue - please see previous notes for more details, including cosult report from Rehab Psych (11/21/09). Wants referral to Orthopedic/Pain physician - in process. States that she thinks that there are "sores on the inside of my back from the injection." Still endorses significant decrease in function. Denies LE weakness, recent falls, bowel/bladder incontinence. Patient states that she has been doing her precribed rehab exercises and now has intermittent, full right sided pain that is sharp in nature. No  numbness/tingling, weakness. No other neurological/stroke symptoms.  4. DM: On no meds 2/2 did not tolerate Metformin (GI) and endorses decreased appetite with confirmed weight loss. Usually 150-200. Have been holding on starting medication until patient eating regularly as adding any medication (other than Metformin - had GI s/e) could cause hypoglycemia. Denies fever/chills, CP, SOB, N/V/D, LE edema.  5. HTN: Lisinopril increased at last visit. BP slightly improved. Denies s/e.  6. Legal: Patient would like records sent to [email or web address]; [place]. [phone].   Current Medications (verified): 1)  Nystatin 100000 Unit/gm Powd (Nystatin) .... Apply To Lower Abdomen Two Times A Day As Needed. Disp 1 Bottle 2)  Adult Diapers .... As Needed.  Disp Enough For Three Times A Day Use For 1 Month. 3)  Onetouch Test  Strp (Glucose Blood) .... Dispense Quantity Sufficient For Twice Daily Blood Glucose Testing. 4)  Onetouch Delica Lancets  Misc (Lancets) .... Dispense Quantity Sufficient For Twice Daily Testing. 5)  Prozac 40 Mg Caps (Fluoxetine Hcl) .... One Daily 6)  Seroquel 200 Mg Tabs (Quetiapine Fumarate) .... 1/2 By Mouth Q Hs 7)  Ambien 10 Mg Tabs (Zolpidem Tartrate) .... One By Mouth Q Hs 8)  Prilosec 20 Mg Cpdr (Omeprazole) .... One By Mouth Bid 9)  Lisinopril 20 Mg Tabs (Lisinopril) .... 2 Tablet By Mouth Daily 10)  Meloxicam 15 Mg Tabs (Meloxicam) .... One By Mouth Daily 11)  Anusol-Hc 2.5 % Crea (Hydrocortisone) .... Per Box  Instructions 12)  Nystatin 100000 Unit/ml Susp (Nystatin) .... 5 Ml Swish, Hold and Swollow Qid X 10 Days  Allergies (verified): 1)  Pristiq (Desvenlafaxine Succinate) PMH-FH-SH reviewed for relevance  Review of Systems      See HPI  Physical Exam  General:  Very anxious AA female. Vitals reviewed. Ears:  R ear normal and L ear normal.   Nose:  External nasal examination shows no deformity or inflammation. Nasal mucosa are pink and moist without  lesions or exudates. Mouth:  White plaque on tongue and OP c/w candida.   Lungs:  Normal respiratory effort and normal breath sounds.   Heart:  Normal rate and regular rhythm.  No murmur. Abdomen:  Soft, normal bowel sounds, no distention, no masses, and epigastric tenderness.   Pulses:  2+ bilateral DP pulses. Extremities:  Trace edema. Psych:  Not suicidal, severely anxious, easily distracted, poor concentration.   Impression & Recommendations:  Problem # 1:  DEPRESSION, MAJOR, WITH PSYCHOTIC BEHAVIOR (ICD-298.0) Assessment Improved  Improvement with Seroquel. Patient has not increased dose as instructed since she is following directions on bottles. Involved son in discussion today.  Orders: [facility]- Est  Level 4 ([address])  Problem # 2:  GASTROESOPHAGEAL REFLUX DISEASE, CHRONIC (ICD-530.81) Assessment: Deteriorated Patient requested Brand name. Advised OTC Maalox/Tums as well. See # 3. Patient may need to be seen by GI again.  Note: EKG obtained today as patient with DM and may have atypical presentation of CP. EKG NSR with nonspecific ST changes in septal leads. Will obtain paper chart to compare to old EKG. May need stress test in the future.  Her updated medication list for this problem includes:    Prilosec 20 Mg Cpdr (Omeprazole) ..... One by mouth bid  Orders: [facility]- Est  Level 4 ([address])  Problem # 3:  CANDIDIASIS, ORAL (ICD-112.0) Assessment: New  Nystatin. Patient has DM, no other known risk factors. No documented HIV status. Not ordered prior to patient leaving. Will order at next visit.  Orders: [facility]- Est  Level 4 ([address])  Problem # 4:  CHRONIC PAIN SYNDROME (ICD-338.4) Assessment: Unchanged Referring to Pain. No red flags today. Patient has not started Mobic. Advised Tylenol first.  Problem # 5:  DIABETES MELLITUS, TYPE II (ICD-250.00) Assessment: Unchanged  Will add low dose sulfonurea when patient eating consistently. Her updated medication list for this problem  includes:    Lisinopril 20 Mg Tabs (Lisinopril) .Marland Kitchen... 2 tablet by mouth daily  Orders: [facility]- Est  Level 4 ([address]11)  Problem # 6:  HYPERTENSION, BENIGN ESSENTIAL (ICD-401.1) Assessment: Unchanged  No change today as to limit medication changes during one visit. Weaning off Clonidine. Her updated medication list for this problem includes:    Lisinopril 20 Mg Tabs (Lisinopril) .Marland Kitchen... 2 tablet by mouth daily  Orders: [facility]- Est  Level 4 ([address]78)  Complete Medication List: 1)  Nystatin 100000 Unit/gm Powd (Nystatin) .... Apply to lower abdomen two times a day as needed. disp 1 bottle 2)  Adult Diapers  .... As needed.  disp enough for three times a day use for 1 month. 3)  Onetouch Test Strp (Glucose blood) .... Dispense quantity sufficient for twice daily blood glucose testing. 4)  Onetouch Delica Lancets Misc (Lancets) .... Dispense quantity sufficient for twice daily testing. 5)  Prozac 40 Mg Caps (Fluoxetine hcl) .... One daily 6)  Seroquel 200 Mg Tabs (Quetiapine fumarate) .... 1/2 by mouth q hs 7)  Ambien 10 Mg Tabs (Zolpidem tartrate) .... One by mouth q  hs 8)  Prilosec 20 Mg Cpdr (Omeprazole) .... One by mouth bid 9)  Lisinopril 20 Mg Tabs (Lisinopril) .... 2 tablet by mouth daily 10)  Meloxicam 15 Mg Tabs (Meloxicam) .... One by mouth daily 11)  Anusol-hc 2.5 % Crea (Hydrocortisone) .... Per box instructions 12)  Nystatin 100000 Unit/ml Susp (Nystatin) .... 5 ml swish, hold and swollow qid x 10 days  Other Orders: EKG- [facility] (EKG)  Patient Instructions: 1)  Please follow up weekly with Luretha Murphy or Dr. Earlene Plater. 2)  Please take the Seroquel to 2 pills at night. 3)  Please wean the Clonidine - 1 pill daily x 1 week, then every other day for 1 week, then off. 4)  Take Tylenol as needed for pain. We are sending you to a pain clinic. 5)  I am prescribing Nystatin for your mouth. 6)  I am changing you Omeprazole to Prilosec. Prescriptions: PRILOSEC 20 MG CPDR (OMEPRAZOLE) one by  mouth BID  #30 x 3   Entered and Authorized by:   Helane Rima DO   Signed by:   Helane Rima DO on 02/21/2010   Method used:   Electronically to        [facility] (retail)       [address]       [place], [place]  [id number]       Ph: [phone]       Fax: [phone]   RxID:   [id number] NYSTATIN 100000 UNIT/ML SUSP (NYSTATIN) 5 ml swish, hold and swollow qid x 10 days  #1 qs x 0   Entered and Authorized by:   Helane Rima DO   Signed by:   Helane Rima DO on 02/21/2010   Method used:   Electronically to        [facility] (retail)       [address]       [place], [place]  [address]       Ph: [phone]       Fax: [phone]   RxID:   [id number] SEROQUEL 200 MG TABS (QUETIAPINE FUMARATE) 1/2 by mouth Q HS  #30 x 0   Entered and Authorized by:   Helane Rima DO   Signed by:   Helane Rima DO on 02/21/2010   Method used:   Electronically to        [facility] (retail)       [address]       [place], [place]  [address]       Ph: [phone]       Fax: [phone]   RxID:   [id number]    Orders Added: 1)  EKG- [facility] [EKG] 2)  [facility]- Est  Level 4 [[address]]   Note: Pharmacy Resident evaluated patient today as well. Helane Rima DO  February 21, 2010 12:48 PM

## 2010-05-23 NOTE — Assessment & Plan Note (Signed)
Summary: f/u eo   Vital Signs:  Patient profile:   60 year old female Height:      63.5 inches Temp:     98.0 degrees F oral Pulse rate:   96 / minute BP sitting:   154 / 83  (left arm) Cuff size:   large  Vitals Entered By: Tessie Fass CMA (February 13, 2010 4:03 PM) CC: F/U multiple issues, see below Is Patient Diabetic? Yes Pain Assessment Patient in pain? yes     Location: lower back Intensity: 8   Primary Care Provider:  Helane Rima DO  CC:  F/U multiple issues and see below.  History of Present Illness: 60 yo F; please see extensive previous notes for more details: Patient presented with her grand-daughter today.  1. Anxiety: SEVERE but IMPROVED with Seroquel q hs. Sleeping better. Patient crying less during this visit than last week. Still focused on her anger and depression/anxiety surrounding her workman's comp and PT - but talks about it less. She denies SI/HI. We were weaning her off of Clonidine since she endorsed s/e, but her psychiatrist asked her to remain on the medication.   2. Back Pain/Debility: Chronic issue - please see previous notes for more details, including cosult report from Rehab Psych (11/21/09). Wants referral to Orthopedic/Pain physician. States that she thinks that there are "sores on the inside of my back from the injection." Still endorses significant decrease in function. Denies LE weakness, recent falls, bowel/bladder incontinence.   3. DM: On no meds 2/2 did not tolerate Metformin (GI) and endorses decreased appetite with confirmed weight loss. High 253. Low 190. Have been holding on starting medication until patient eating regularly as adding any medication (other than Metformin - had GI s/e) could cause hypoglycemia. Denies fever/chills, CP, SOB, N/V/D, LE edema.  4. HTN: Lisinopril started at last visit. BP slightly improved. Denies s/e.  5. Legal: Patient would like records sent to [email or web address]; [place].  [phone].   Current Medications (verified): 1)  Nystatin 100000 Unit/gm Powd (Nystatin) .... Apply To Lower Abdomen Two Times A Day As Needed. Disp 1 Bottle 2)  Adult Diapers .... As Needed.  Disp Enough For Three Times A Day Use For 1 Month. 3)  Onetouch Test  Strp (Glucose Blood) .... Dispense Quantity Sufficient For Twice Daily Blood Glucose Testing. 4)  Onetouch Delica Lancets  Misc (Lancets) .... Dispense Quantity Sufficient For Twice Daily Testing. 5)  Prozac 40 Mg Caps (Fluoxetine Hcl) .... One Daily 6)  Seroquel 50 Mg Tabs (Quetiapine Fumarate) .... 2 By Mouth Q Hs 7)  Ambien 10 Mg Tabs (Zolpidem Tartrate) .... One By Mouth Q Hs 8)  Omeprazole 20 Mg Cpdr (Omeprazole) .... One By Mouth Daily 9)  Lisinopril 20 Mg Tabs (Lisinopril) .Marland Kitchen.. 1 Tablet By Mouth Daily 10)  Meloxicam 15 Mg Tabs (Meloxicam) .... One By Mouth Daily  Allergies (verified): 1)  Pristiq (Desvenlafaxine Succinate) PMH-FH-SH reviewed for relevance  Review of Systems      See HPI  Physical Exam  General:  Very anxious AA female. Vitals reviewed. Lungs:  Normal respiratory effort and normal breath sounds.   Heart:  Normal rate and regular rhythm.   Pulses:  2+ bilateral DP pulses. Psych:  Not suicidal, severely anxious, easily distracted, poor concentration, and angry at times.   Impression & Recommendations:  Problem # 1:  DEPRESSION, MAJOR, WITH PSYCHOTIC BEHAVIOR (ICD-298.0) Assessment Improved  Seroquel continues to improve sleep and mood. Will discuss this complicated case with the patient's  psychiatrist. No SI/HI. Must tackle this issue before any other can be controlled adequately.  Orders: [facility]- Est  Level 4 ([address])  Problem # 2:  CHRONIC PAIN SYNDROME (ICD-338.4) Assessment: Unchanged Refer to Pain. Chronic at this point. Patient would likely benefit from further PT/OT. However, she should be allowed to stay in [place] for this. She has incredible anger centered around the [facility] in  [place] and, at this point, it seems to have become a barrier to her healing. Orders: Pain Clinic Referral (Pain) [facility]- Est  Level 4 ([address])  Problem # 3:  DIABETES MELLITUS, TYPE II (ICD-250.00) Assessment: Unchanged  Advised regular meals throughout the day. May start glucerna. Record blood glucose. Will add medication at next visit next week. Her updated medication list for this problem includes:    Lisinopril 20 Mg Tabs (Lisinopril) .Marland Kitchen... 2 tablet by mouth daily  Orders: [facility]- Est  Level 4 ([address]11)  Problem # 4:  HYPERTENSION, BENIGN ESSENTIAL (ICD-401.1) Assessment: Improved  Increase Lisinopril.  Her updated medication list for this problem includes:    Lisinopril 20 Mg Tabs (Lisinopril) .Marland Kitchen... 2 tablet by mouth daily  Orders: [facility]- Est  Level 4 ([address]78)  Complete Medication List: 1)  Nystatin 100000 Unit/gm Powd (Nystatin) .... Apply to lower abdomen two times a day as needed. disp 1 bottle 2)  Adult Diapers  .... As needed.  disp enough for three times a day use for 1 month. 3)  Onetouch Test Strp (Glucose blood) .... Dispense quantity sufficient for twice daily blood glucose testing. 4)  Onetouch Delica Lancets Misc (Lancets) .... Dispense quantity sufficient for twice daily testing. 5)  Prozac 40 Mg Caps (Fluoxetine hcl) .... One daily 6)  Seroquel 50 Mg Tabs (Quetiapine fumarate) .... 2 by mouth q hs 7)  Ambien 10 Mg Tabs (Zolpidem tartrate) .... One by mouth q hs 8)  Omeprazole 20 Mg Cpdr (Omeprazole) .... One by mouth daily 9)  Lisinopril 20 Mg Tabs (Lisinopril) .... 2 tablet by mouth daily 10)  Meloxicam 15 Mg Tabs (Meloxicam) .... One by mouth daily 11)  Anusol-hc 2.5 % Crea (Hydrocortisone) .... Per box instructions  Patient Instructions: 1)  Please follow up weekly with Luretha Murphy or Dr. Earlene Plater. 2)  Please take the Seroquel - 3 by mouth at night. 3)  I am increasing lisinopril for your blood pressure and to protect your kidneys. 4)  I am sending in you  hemorrhoid medication. Prescriptions: ANUSOL-HC 2.5 % CREA (HYDROCORTISONE) per box instructions  #1 x 3   Entered and Authorized by:   Helane Rima DO   Signed by:   Helane Rima DO on 02/13/2010   Method used:   Electronically to        [facility] (retail)       [address]       [place], [place]  [id number]       Ph: [phone]       Fax: [phone]   RxID:   [id number]    Orders Added: 1)  Pain Clinic Referral [Pain] 2)  [facility]- Est  Level 4 [[address]]

## 2010-05-23 NOTE — Miscellaneous (Signed)
Summary: DNKA  Clinical Lists Changes 

## 2010-05-23 NOTE — Progress Notes (Signed)
Summary: phn msg  Phone Note Call from Patient Call back at [phone]   Caller: daughter-Wanda Summary of Call: Cymbalta & Lyrica are making her severe tremors/shaky and incontinent - wanted to let doctor know and is not sure what to do. was just released from hosp last night.  did not give last night Initial call taken by: De Nurse,  August 17, 2009 8:34 AM  Follow-up for Phone Call        she should call her doctor who prescribes both of these medicines as they will want to know and likely slowly decrease them down.  Follow-up by: Ancil Boozer  MD,  August 17, 2009 9:15 AM  Additional Follow-up for Phone Call Additional follow up Details #1::        Patient daughter informed, states she will call psychiatrists office. Additional Follow-up by: Garen Grams LPN,  August 17, 2009 9:22 AM

## 2010-05-23 NOTE — Letter (Signed)
Summary: Work Excuse  [facility]  [address]   [place], [place] [address]   Phone: [phone]  Fax: [phone]    Today's Date: January 18, 2010  Name of Patient: [name] [name]  The above named patient had a medical visit today at:  11:00am - 12: 00pm.  Please take this into consideration when reviewing the time away from work/school/rehab.    Sincerely yours,   Helane Rima DO

## 2010-05-23 NOTE — Assessment & Plan Note (Signed)
Summary: f/up,tcb   Vital Signs:  Patient profile:   60 year old female Height:      63.5 inches Weight:      218.1 pounds BMI:     38.17 Temp:     99.6 degrees F oral Pulse rate:   116 / minute BP sitting:   143 / 78  (left arm) Cuff size:   large  Vitals Entered By: Garen Grams LPN (September 25, 9809 2:34 PM) CC: f/u MRI/headaches/etc, fingers Is Patient Diabetic? No Pain Assessment Patient in pain? yes     Location: back/fingers/toes Intensity: 7   Primary Care Provider:  Ancil Boozer  MD  CC:  f/u MRI/headaches/etc and fingers.  History of Present Illness: headaches/brain pain: states it still occurs.  still having some shaking.  frustrated that her sister is making her walk so much.  frustrated she isn't feeling better overall as a whole.   no change in sensation of pain and descrption of "brain being pulled out."  fingers: states she has been having pain in tips of left index and right middle fingers for past 2 weeks.  also swollen a little.  not red, not hot, no pus draining.  has been putting antibiotic ointment on them and keeping covered with bandages.  also soaking them regularly. she also thinks the finger on her left hand is getting "darker in color"  Habits & Providers  Alcohol-Tobacco-Diet     Tobacco Status: current     Tobacco Counseling: to quit use of tobacco products  Allergies: No Known Drug Allergies  Past History:  Past medical, surgical, family and social histories (including risk factors) reviewed for relevance to current acute and chronic problems.  Past Medical History: Reviewed history from 08/14/2009 and no changes required. CATARACT EXTRACTIONS, BILATERAL, HX OF (ICD-V45.61) CHRONIC PAIN SYNDROME (ICD-338.4) DEPRESSION, MAJOR, WITH PSYCHOTIC BEHAVIOR (ICD-298.0) TOBACCO USER (ICD-305.1) CHRONIC INTERSTITIAL CYSTITIS (ICD-595.1) GASTROESOPHAGEAL REFLUX DISEASE, CHRONIC (ICD-530.81) URINARY INCONTINENCE (ICD-788.30) Hx of BENIGN  POSITIONAL VERTIGO (ICD-386.11) HYPERTENSION, BENIGN ESSENTIAL (ICD-401.1)  Past Surgical History: Reviewed history from 06/18/2006 and no changes required. Cholecystectomy - 04/21/1997, tah and single oophorectomy--fibroids - 04/22/1991  Family History: Reviewed history from 08/14/2009 and no changes required. cad--daugher--mi age 27, dm--sister  Social History: Reviewed history from 09/14/2009 and no changes required. Lives with son Aurther Loft).; Smokes 1/2 pk cig/day for 20+ years.; No alcohol or drugs  sister of Jeannine Kitten [facility] patient)  Review of Systems       per HPI  Physical Exam  General:  alert, obese AAF in NAD but anxious VS noted - mildly hypertensive, tachycardic. Msk:  gait improved - still using walker but able to move much more quickly Extremities:  left index and right middle fingers appear slightly swollen at tips but no fluctance, no erythema, no induration, unable to express anything from around nail.    Impression & Recommendations:  Problem # 1:  HEADACHE (ICD-784.0) Assessment Unchanged  discussed MRI results in detail and that it found possible sinus infection which could potentially be causing at least part of her headache.  we have elected after discuss to do a 2 pronged approach - try to decrease swelling in the nasal turbinates to help open up passage of the sinus as well as a prolonged course of antibitiocs for treatment.  she expresses understanding.  told ehr that i cannot guarantee this will fix her headache pains but it may help some.  if opacities persist may need to see ENT specialist.  Her updated medication list for this problem includes:    Hydromorphone Hcl 4 Mg Tabs (Hydromorphone hcl) .Marland Kitchen..Marland Kitchen Two times a day per pain clinic  Orders: [facility]- Est Level  3 ([address]8119)  Problem # 2:  PAIN IN SOFT TISSUES OF LIMB (ICD-729.5) Assessment: New  fingers do not look infected at this point but will have patient keep close eye on them.  rx for keflex  for sinus infxn should also possibly help fingers if they are infected.    Orders: [facility]- Est Level  3 ([address])  Complete Medication List: 1)  Dyazide 37.5-25 Mg Caps (Triamterene-hctz) .... Take 1 capsule by mouth once a day 2)  Alprazolam 1 Mg Tabs (Alprazolam) .... By mouth at bedtime per outside doc 3)  Bromday 0.09 % Soln (Bromfenac sodium) .Marland Kitchen.. 1 drop l eye daily 4)  Omeprazole 20 Mg Cpdr (Omeprazole) .... Two times a day 5)  Besivance 0.6 % Susp (Besifloxacin hcl) .Marland Kitchen.. 1 drop left three times a day 6)  Flonase 50 Mcg/act Susp (Fluticasone propionate) .... 2 sprays each nostril daily 7)  Cetirizine Hcl 10 Mg Tabs (Cetirizine hcl) .... Daily 8)  Prednisolone Acetate 1 % Susp (Prednisolone acetate) .... Eye drops 1 drop both eyes qid 9)  Nystatin 100000 Unit/gm Powd (Nystatin) .... Apply to lower abdomen two times a day as needed. disp 1 bottle 10)  Hydromorphone Hcl 4 Mg Tabs (Hydromorphone hcl) .... Two times a day per pain clinic 11)  Topiramate 25 Mg Tabs (Topiramate) .... 2 tabs three times a day per outside clinic 12)  Metformin Hcl 500 Mg Tabs (Metformin hcl) .Marland Kitchen.. 1 by mouth at bedtime for 1wk then 1 by mouth 2x a day for 1 wk then 1 in am and 2 at bedtime for 1 wk then 2 by mouth 2x a day 13)  Ensure Liqd (Nutritional supplements) .Marland Kitchen.. 1 drink two times a day as needed.  disp 1 case. 14)  Adult Diapers  .... As needed.  disp enough for three times a day use for 1 month. 15)  Cephalexin 500 Mg Caps (Cephalexin) .Marland Kitchen.. 1 by mouth two times a day for 14 days.  Patient Instructions: 1)  Please follow up in 1 month. 2)  Use the antibiotic for the next 2 weeks 3)  Also use the nose spray regularly to help open up the sinuses 4)  Be sure to watch the fingers.  If they get worse please let me know. 5)  Keep working hard with the walking, etc.  You are making good progress! Prescriptions: FLONASE 50 MCG/ACT SUSP (FLUTICASONE PROPIONATE) 2 sprays each nostril daily  #1 x 3   Entered and  Authorized by:   Ancil Boozer  MD   Signed by:   Ancil Boozer  MD on 09/25/2009   Method used:   Electronically to        [facility] (retail)       [address]       [place], [place]  [address]       Ph: [phone]       Fax: [phone]   RxID:   [id number] OMEPRAZOLE 20 MG CPDR (OMEPRAZOLE) two times a day  #60 x 3   Entered and Authorized by:   Ancil Boozer  MD   Signed by:   Ancil Boozer  MD on 09/25/2009   Method used:   Electronically to        [facility] (retail)  [address]       [place], [place]  [address]       Ph: [phone]       Fax: [phone]   RxID:   [id number] CEPHALEXIN 500 MG CAPS (CEPHALEXIN) 1 by mouth two times a day for 14 days.  #28 x 0   Entered and Authorized by:   Ancil Boozer  MD   Signed by:   Ancil Boozer  MD on 09/25/2009   Method used:   Electronically to        [facility] (retail)       [address]       [place], [place]  [id number]       Ph: [phone]       Fax: [phone]   RxID:   [id number]

## 2010-05-23 NOTE — Progress Notes (Signed)
Summary: ROI  ROI   Imported By: De Nurse 05/02/2010 16:41:52  _____________________________________________________________________  External Attachment:    Type:   Image     Comment:   External Document

## 2010-05-23 NOTE — Consult Note (Signed)
Summary: [facility]   Imported By: Clydell Hakim 11/30/2009 14:23:24  _____________________________________________________________________  External Attachment:    Type:   Image     Comment:   External Document

## 2010-05-23 NOTE — Consult Note (Signed)
Summary: [facility]  [facility]   Imported By: Clydell Hakim 09/17/2009 09:08:45  _____________________________________________________________________  External Attachment:    Type:   Image     Comment:   External Document

## 2010-05-23 NOTE — Miscellaneous (Signed)
Summary: re: Dr.Mann/ts  called Dr.Mann's office. spoke with Tre and she reports, that the pt was last seen in 1999 by Dr. Loreta Ave and only was seen once. She had an outstanding bill from that visit, which she paid.  When she was admitted to the Hospital, the [facility] GI doctor saw her. She also was seen by [facility] in 2007. Dr.Konkol, who saw pt in Hospital cancelled appt with Dr.Mann, because pt has to f/up with [facility], which has been seeing her. She has to call them, in order to be seen. She may has to pay an old bill with them.  I explained above to the pt and Dr.Wallace witnessed conversation. Result and Plan: Pt will call [facility] for f/up visit from her Hospital Stay. She will talk with them about a payment plan. We are unable to schedule this appt for her, because she needs to call them to talk about her bill. Pt agreed with the plan. Arlyss Repress CMA,  April 30, 2010 11:35 AM

## 2010-05-23 NOTE — Assessment & Plan Note (Signed)
Summary: f/u eo   Vital Signs:  Patient profile:   60 year old female Height:      63.5 inches Weight:      194 pounds BMI:     33.95 Temp:     98.9 degrees F oral Pulse rate:   109 / minute BP sitting:   160 / 85  (left arm) Cuff size:   large  Vitals Entered By: Tessie Fass CMA (March 12, 2010 10:18 AM) CC: F/U HTN, abdominal pain, weight loss Is Patient Diabetic? Yes Pain Assessment Patient in pain? yes     Location: abdomen Intensity: 8   Primary Care Provider:  Helane Rima DO  CC:  F/U HTN, abdominal pain, and weight loss.  History of Present Illness: 60 yo F:  1. HTN: Restarted Clonidine per psych in [place]. Unknown dose. Patient will bring bottle to next visit.   2. Abdominal Pain: Upcoming appointment with GI on 04/25/10 for reflux and constipation.  3. Weight Loss: No appetite.   Current Medications (verified): 1)  Nystatin 100000 Unit/gm Powd (Nystatin) .... Apply To Lower Abdomen Two Times A Day As Needed. Disp 1 Bottle 2)  Adult Diapers .... As Needed.  Disp Enough For Three Times A Day Use For 1 Month. 3)  Onetouch Test  Strp (Glucose Blood) .... Dispense Quantity Sufficient For Twice Daily Blood Glucose Testing. 4)  Onetouch Delica Lancets  Misc (Lancets) .... Dispense Quantity Sufficient For Twice Daily Testing. 5)  Prozac 40 Mg Caps (Fluoxetine Hcl) .... One Daily 6)  Seroquel 200 Mg Tabs (Quetiapine Fumarate) .... 1/2 By Mouth Q Hs 7)  Ambien 10 Mg Tabs (Zolpidem Tartrate) .... One By Mouth Q Hs 8)  Prilosec 20 Mg Cpdr (Omeprazole) .... One By Mouth Bid 9)  Lisinopril-Hydrochlorothiazide 20-25 Mg Tabs (Lisinopril-Hydrochlorothiazide) .... One Daily 10)  Meloxicam 15 Mg Tabs (Meloxicam) .... One By Mouth Daily 11)  Anusol-Hc 2.5 % Crea (Hydrocortisone) .... Per Box Instructions 12)  Polyethylene Glycol 3350  Powd (Polyethylene Glycol 3350) .Marland KitchenMarland KitchenMarland Kitchen 17 Gm in 4-6 Oz Water and Drink Daily, Qs 13)  Lisinopril 20 Mg Tabs (Lisinopril) .Marland Kitchen.. 1 Tablet By  Mouth Daily 14)  Promethazine Hcl 25 Mg  Tabs (Promethazine Hcl) .... One By Mouth Two Times A Day As Needed Nausea  Allergies (verified): 1)  Pristiq (Desvenlafaxine Succinate) PMH-FH-SH reviewed for relevance  Review of Systems General:  Denies chills and fever. CV:  Denies chest pain or discomfort, shortness of breath with exertion, and swelling of feet. GI:  Complains of abdominal pain, constipation, indigestion, and nausea; denies diarrhea and vomiting. MS:  Complains of joint pain. Derm:  Denies rash. Psych:  Complains of anxiety and depression; denies suicidal thoughts/plans and thoughts /plans of harming others.  Physical Exam  General:  Well-developed, well-nourished, in no acute distress; alert. Vitals reviewed. Lungs:  Normal respiratory effort and normal breath sounds.   Heart:  Normal rate and regular rhythm.  No murmur. Abdomen:  Soft, normal bowel sounds, no distention, no masses, no hepatomegaly, and no splenomegaly.   Pulses:  2+ bilateral DP pulses. Extremities:  Trace edema. Psych:  Oriented X3 and memory intact for recent and remote.  Less anxious today.   Impression & Recommendations:  Problem # 1:  HYPERTENSION, BENIGN ESSENTIAL (ICD-401.1) Assessment Unchanged  Increased Lisinopril. Will change Rx once patient runs out of current pills (has #90 that we do not want to waste). Recheck next week. Also on Clonidine again per psych. Her updated medication list for this  problem includes:    Lisinopril-hydrochlorothiazide 20-25 Mg Tabs (Lisinopril-hydrochlorothiazide) ..... One daily    Lisinopril 20 Mg Tabs (Lisinopril) .Marland Kitchen... 1 tablet by mouth daily  Orders: [facility]- Est  Level 4 ([address]214)  Problem # 2:  CONSTIPATION (ICD-564.00) Assessment: Unchanged  Increase to two times a day. Upcoming appointment with GI. Her updated medication list for this problem includes:    Polyethylene Glycol 3350 Powd (Polyethylene glycol 3350) .Marland KitchenMarland KitchenMarland KitchenMarland Kitchen 17 gm in 4-6 oz water and drink  daily, qs  Orders: [facility]- Est  Level 4 ([address])  Problem # 3:  LOSS OF WEIGHT (ICD-783.21) Assessment: Unchanged  Patient continues to lose weight. Will continue to monitor. Suspect that GI evaluation will be helpful. No RED FLAGs today. HIV test at next visit - patient had to catch public transportation today. Rx phenergan for nausea.  Orders: [facility]- Est  Level 4 ([address]454)  Complete Medication List: 1)  Nystatin 100000 Unit/gm Powd (Nystatin) .... Apply to lower abdomen two times a day as needed. disp 1 bottle 2)  Adult Diapers  .... As needed.  disp enough for three times a day use for 1 month. 3)  Onetouch Test Strp (Glucose blood) .... Dispense quantity sufficient for twice daily blood glucose testing. 4)  Onetouch Delica Lancets Misc (Lancets) .... Dispense quantity sufficient for twice daily testing. 5)  Prozac 40 Mg Caps (Fluoxetine hcl) .... One daily 6)  Seroquel 200 Mg Tabs (Quetiapine fumarate) .... 1/2 by mouth q hs 7)  Ambien 10 Mg Tabs (Zolpidem tartrate) .... One by mouth q hs 8)  Prilosec 20 Mg Cpdr (Omeprazole) .... One by mouth bid 9)  Lisinopril-hydrochlorothiazide 20-25 Mg Tabs (Lisinopril-hydrochlorothiazide) .... One daily 10)  Meloxicam 15 Mg Tabs (Meloxicam) .... One by mouth daily 11)  Anusol-hc 2.5 % Crea (Hydrocortisone) .... Per box instructions 12)  Polyethylene Glycol 3350 Powd (Polyethylene glycol 3350) .Marland KitchenMarland Kitchen. 17 gm in 4-6 oz water and drink daily, qs 13)  Lisinopril 20 Mg Tabs (Lisinopril) .Marland Kitchen.. 1 tablet by mouth daily 14)  Promethazine Hcl 25 Mg Tabs (Promethazine hcl) .... One by mouth two times a day as needed nausea  Patient Instructions: 1)  It was nice to see you today! 2)  For your nausea: Phenergen. 3)  For your blood pressure: ADD 1 PILL OF LISINOPRIL 20 MG by mouth DAILY TO YOUR LISINOPRIL/HCTZ FOR A TOTAL OF LISINOPRIL 40/HCTZ 25. 4)  Bring in your Clonidine next week so that we can note the dose. Prescriptions: PRILOSEC 20 MG CPDR (OMEPRAZOLE) one  by mouth BID  #60 x 2   Entered and Authorized by:   Helane Rima DO   Signed by:   Helane Rima DO on 03/12/2010   Method used:   Electronically to        [facility]* (retail)       [address]       [address] Shipping/mailing       [place], [place]  [address]       Ph: [phone]       Fax: [phone]   RxID:   [id number] PROMETHAZINE HCL 25 MG  TABS (PROMETHAZINE HCL) one by mouth two times a day as needed nausea  #30 x 0   Entered and Authorized by:   Helane Rima DO   Signed by:   Helane Rima DO on 03/12/2010   Method used:   Electronically to        [facility]* (retail)  [address]       [address] Shipping/mailing       [place], [place]  [address]       Ph: [phone]       Fax: [phone]   RxID:   [id number] LISINOPRIL 20 MG TABS (LISINOPRIL) 1 tablet by mouth daily  #60 x 0   Entered and Authorized by:   Helane Rima DO   Signed by:   Helane Rima DO on 03/12/2010   Method used:   Electronically to        [facility]* (retail)       [address]       [address] Shipping/mailing       [place], [place]  [address]       Ph: [phone]       Fax: [phone]   RxID:   [id number] PRILOSEC 20 MG CPDR (OMEPRAZOLE) one by mouth BID  #60 x 2   Entered and Authorized by:   Helane Rima DO   Signed by:   Helane Rima DO on 03/12/2010   Method used:   Electronically to        [facility]* (retail)       [address]       [place], [place]  [address]       Ph: [phone]       Fax: [phone]   RxID:   [id number] PROMETHAZINE HCL 25 MG  TABS (PROMETHAZINE HCL) one by mouth two times a day as needed nausea  #30 x 0   Entered and Authorized by:   Helane Rima DO   Signed by:   Helane Rima DO on 03/12/2010   Method used:   Electronically to        [facility]* (retail)       [address]        [place], [place]  [address]       Ph: [phone]       Fax: [phone]   RxID:   [id number] LISINOPRIL 20 MG TABS (LISINOPRIL) 1 tablet by mouth daily  #60 x 0   Entered and Authorized by:   Helane Rima DO   Signed by:   Helane Rima DO on 03/12/2010   Method used:   Electronically to        [facility]* (retail)       [address]       [place], [place]  [address]       Ph: [phone]       Fax: [phone]   RxID:   [id number]    Orders Added: 1)  [facility]- Est  Level 4 [09735]    Prevention & Chronic Care Immunizations   Influenza vaccine: Fluvax Non-MCR  (03/04/2010)   Influenza vaccine due: 01/23/2009    Tetanus booster: 07/20/2005: Done.   Tetanus booster due: 07/21/2015    Pneumococcal vaccine: Not documented    H. zoster vaccine: Not documented  Colorectal Screening   Hemoccult: Not documented   Hemoccult action/deferral: Not indicated  (09/14/2009)    Colonoscopy: Done.  (08/19/2005)   Colonoscopy due: 08/20/2015  Other Screening   Pap smear: normal  (08/10/2007)   Pap smear action/deferral: Not indicated S/P hysterectomy  (11/13/2008)   Pap smear due: 08/09/2008    Mammogram: Done.  (07/21/1998)   Mammogram due: 07/21/1999    DXA bone density scan: Not documented  Smoking status: current  (03/04/2010)   Smoking cessation counseling: yes  (06/14/2008)  Diabetes Mellitus   HgbA1C: 8.5  (03/04/2010)    Eye exam: Not documented    Foot exam: yes  (12/07/2009)   High risk foot: Not documented   Foot care education: Not documented    Urine microalbumin/creatinine ratio: Not documented    Diabetes flowsheet reviewed?: Yes   Progress toward A1C goal: Unchanged  Lipids   Total Cholesterol: 200  (08/10/2007)   LDL: 91  (08/10/2007)   LDL Direct: Not documented   HDL: 51  (08/10/2007)   Triglycerides: 290  (08/10/2007)  Hypertension   Last Blood Pressure: 160 / 85  (03/12/2010)   Serum creatinine: 0.70   (01/28/2010)   Serum potassium 4.3  (01/28/2010)    Hypertension flowsheet reviewed?: Yes   Progress toward BP goal: Unchanged  Self-Management Support :   Personal Goals (by the next clinic visit) :     Personal A1C goal: 8  (09/14/2009)     Personal blood pressure goal: 130/80  (09/14/2009)     Personal LDL goal: 100  (09/14/2009)    Patient will work on the following items until the next clinic visit to reach self-care goals:     Medications and monitoring: take my medicines every day, bring all of my medications to every visit  (03/12/2010)     Eating: drink diet soda or water instead of juice or soda, eat more vegetables, use fresh or frozen vegetables, eat foods that are low in salt, eat baked foods instead of fried foods, eat fruit for snacks and desserts, limit or avoid alcohol  (03/12/2010)     Activity: take a 30 minute walk every day  (03/12/2010)    Diabetes self-management support: Written self-care plan  (03/12/2010)   Diabetes care plan printed    Diabetes self-management support not done because: Not indicated  (09/14/2009)    Hypertension self-management support: Written self-care plan  (03/12/2010)   Hypertension self-care plan printed.    Hypertension self-management support not done because: Good outcomes  (09/14/2009)

## 2010-05-23 NOTE — Consult Note (Signed)
Summary: [facility]   Imported By: Haydee Salter 08/03/2006 15:19:31  _____________________________________________________________________  External Attachment:    Type:   Image     Comment:   External Document

## 2010-05-23 NOTE — Assessment & Plan Note (Signed)
Summary: swelling in legs   Vital Signs:  Patient profile:   60 year old female Height:      63.5 inches Weight:      221.6 pounds BMI:     38.78 Temp:     98.1 degrees F oral Pulse rate:   109 / minute BP sitting:   127 / 79  (left arm) Cuff size:   large  Vitals Entered By: Garen Grams LPN (October 15, 2009 4:27 PM) CC: swelling in legs, nasal congestion, itching Is Patient Diabetic? Yes Did you bring your meter with you today? No Pain Assessment Patient in pain? yes     Location: hands/headache   Primary Care Provider:  Ancil Boozer  MD  CC:  swelling in legs, nasal congestion, and itching.  History of Present Illness: swelling: started again several days ago.  unknown reason to patient.  legs are slightly tender to touch and both are swollen.  no associated fevers. she does report she has been taking her diabetes medications.  denies chest pains, denies shortness of breath.  nasal congestion: comes and goes. she isn't sure what all she is taking for it but thinks she is taking her nose spray for the nasal congestion.   again denies fevers or cough.   itching: has noticed diffuse itching on skin.  soaps seem to irritate it.  she reports she's tried multiple OTC creams/ointments without relief.  she has not seen an associated rash other than on her left knee and some peeling on her hands.   Habits & Providers  Alcohol-Tobacco-Diet     Tobacco Status: current     Tobacco Counseling: to quit use of tobacco products  Current Medications (verified): 1)  Dyazide 37.5-25 Mg Caps (Triamterene-Hctz) .... Take 1 Capsule By Mouth Once A Day 2)  Alprazolam 1 Mg Tabs (Alprazolam) .... By Mouth At Bedtime Per Outside Doc 3)  Bromday 0.09 % Soln (Bromfenac Sodium) .Marland Kitchen.. 1 Drop L Eye Daily 4)  Omeprazole 20 Mg Cpdr (Omeprazole) .... Two Times A Day 5)  Besivance 0.6 % Susp (Besifloxacin Hcl) .Marland Kitchen.. 1 Drop Left Three Times A Day 6)  Flonase 50 Mcg/act Susp (Fluticasone Propionate) .... 2  Sprays Each Nostril Daily 7)  Cetirizine Hcl 10 Mg Tabs (Cetirizine Hcl) .... Daily 8)  Prednisolone Acetate 1 % Susp (Prednisolone Acetate) .... Eye Drops 1 Drop Both Eyes Qid 9)  Nystatin 100000 Unit/gm Powd (Nystatin) .... Apply To Lower Abdomen Two Times A Day As Needed. Disp 1 Bottle 10)  Hydromorphone Hcl 4 Mg Tabs (Hydromorphone Hcl) .... Two Times A Day Per Pain Clinic 11)  Topiramate 25 Mg Tabs (Topiramate) .... 2 Tabs Three Times A Day Per Outside Clinic 12)  Metformin Hcl 500 Mg Tabs (Metformin Hcl) .Marland Kitchen.. 1 By Mouth At Bedtime For 1wk Then 1 By Mouth 2x A Day For 1 Wk Then 1 in Am and 2 At Bedtime For 1 Wk Then 2 By Mouth 2x A Day 13)  Ensure  Liqd (Nutritional Supplements) .Marland Kitchen.. 1 Drink Two Times A Day As Needed.  Disp 1 Case. 14)  Adult Diapers .... As Needed.  Disp Enough For Three Times A Day Use For 1 Month. 15)  Triamcinolone Acetonide 0.5 % Crea (Triamcinolone Acetonide) .... Apply To Itchy Areas Two Times A Day As Needed.  Disp 60g Tube 16)  Furosemide 20 Mg Tabs (Furosemide) .Marland Kitchen.. 1 By Mouth Qam For Excess Fluid  Allergies (verified): No Known Drug Allergies  Past History:  Past medical,  surgical, family and social histories (including risk factors) reviewed for relevance to current acute and chronic problems.  Past Medical History: Reviewed history from 08/14/2009 and no changes required. CATARACT EXTRACTIONS, BILATERAL, HX OF (ICD-V45.61) CHRONIC PAIN SYNDROME (ICD-338.4) DEPRESSION, MAJOR, WITH PSYCHOTIC BEHAVIOR (ICD-298.0) TOBACCO USER (ICD-305.1) CHRONIC INTERSTITIAL CYSTITIS (ICD-595.1) GASTROESOPHAGEAL REFLUX DISEASE, CHRONIC (ICD-530.81) URINARY INCONTINENCE (ICD-788.30) Hx of BENIGN POSITIONAL VERTIGO (ICD-386.11) HYPERTENSION, BENIGN ESSENTIAL (ICD-401.1)  Past Surgical History: Reviewed history from 06/18/2006 and no changes required. Cholecystectomy - 04/21/1997, tah and single oophorectomy--fibroids - 04/22/1991  Family History: Reviewed history from  08/14/2009 and no changes required. cad--daugher--mi age 53, dm--sister  Social History: Reviewed history from 09/25/2009 and no changes required. Lives with son Aurther Loft).; Smokes 1/2 pk cig/day for 20+ years.; No alcohol or drugs  sister of [facility] patient)  Review of Systems       per HPI  Physical Exam  General:  alert, obese AAF in NAD but anxious VS noted - tachycardic. Head:  normocephalic and atraumatic.   Ears:  no external deformities.   Nose:  External nasal examination shows no deformity or inflammation. Nasal mucosa are pink and moist without lesions or exudates. Mouth:  MMM Neck:  No deformities, masses, or tenderness noted. Lungs:  Normal respiratory effort, chest expands symmetrically. Lungs are clear to auscultation, no crackles or wheezes. Heart:  Distant S1 and S2.  No mumurs noted Extremities:  2+ pitting edema bilateral lower extremities.  tender to touch.  no erythema or warmth Skin:  warts noted on medial aspect of left knee with hyperpigmentation.  mild peeling of skin on hands around nails.    Impression & Recommendations:  Problem # 1:  EDEMA (ICD-782.3) Assessment Deteriorated i suspect likely this is related to noncompliance of medications.  regardless we haven't assessed for cardiac cause fully - will check ECHO to eval for pulmonary htn or right sided heart issues.  start lasix.  f/u 2 weeks to check kidney fxn and electrolytes and to see if improved.   Her updated medication list for this problem includes:    Dyazide 37.5-25 Mg Caps (Triamterene-hctz) .Marland Kitchen... Take 1 capsule by mouth once a day    Furosemide 20 Mg Tabs (Furosemide) .Marland Kitchen... 1 by mouth qam for excess fluid  Orders: 2 D Echo (2 D Echo) [facility]- Est  Level 4 ([address]40)  Problem # 2:  OTHER DISEASES OF NASAL CAVITY AND SINUSES (ICD-478.19) Assessment: Deteriorated  refilled flonase and cetirizine for her to take.  no indication for abx at this time  Orders: [facility]- Est  Level 4  ([address]19)  Problem # 3:  PRURITUS (ICD-698.9) Assessment: New  suspect this is related to her psychiatric disease.  will use steroid cream as needed for itchy areas.  f/u as scheduled.   Orders: [facility]- Est  Level 4 ([address]782)  Complete Medication List: 1)  Dyazide 37.5-25 Mg Caps (Triamterene-hctz) .... Take 1 capsule by mouth once a day 2)  Alprazolam 1 Mg Tabs (Alprazolam) .... By mouth at bedtime per outside doc 3)  Bromday 0.09 % Soln (Bromfenac sodium) .Marland Kitchen.. 1 drop l eye daily 4)  Omeprazole 20 Mg Cpdr (Omeprazole) .... Two times a day 5)  Besivance 0.6 % Susp (Besifloxacin hcl) .Marland Kitchen.. 1 drop left three times a day 6)  Flonase 50 Mcg/act Susp (Fluticasone propionate) .... 2 sprays each nostril daily 7)  Cetirizine Hcl 10 Mg Tabs (Cetirizine hcl) .... Daily 8)  Prednisolone Acetate 1 % Susp (Prednisolone acetate) .... Eye drops 1 drop  both eyes qid 9)  Nystatin 100000 Unit/gm Powd (Nystatin) .... Apply to lower abdomen two times a day as needed. disp 1 bottle 10)  Hydromorphone Hcl 4 Mg Tabs (Hydromorphone hcl) .... Two times a day per pain clinic 11)  Topiramate 25 Mg Tabs (Topiramate) .... 2 tabs three times a day per outside clinic 12)  Metformin Hcl 500 Mg Tabs (Metformin hcl) .Marland Kitchen.. 1 by mouth at bedtime for 1wk then 1 by mouth 2x a day for 1 wk then 1 in am and 2 at bedtime for 1 wk then 2 by mouth 2x a day 13)  Ensure Liqd (Nutritional supplements) .Marland Kitchen.. 1 drink two times a day as needed.  disp 1 case. 14)  Adult Diapers  .... As needed.  disp enough for three times a day use for 1 month. 15)  Triamcinolone Acetonide 0.5 % Crea (Triamcinolone acetonide) .... Apply to itchy areas two times a day as needed.  disp 60g tube 16)  Furosemide 20 Mg Tabs (Furosemide) .Marland Kitchen.. 1 by mouth qam for excess fluid  Patient Instructions: 1)  Please follow up with Dr Sandi Mealy in 2 weeks.   2)  Please be sure to go to your echocardiogram 3)  Start the fluid pill once daily to help with your legs 4)  Please start  the steroid cream as needed for itching and use dove soap for your body.  5)  Be sure to use your cetirizine and flonase daily for allergies/nasal congestion Prescriptions: CETIRIZINE HCL 10 MG TABS (CETIRIZINE HCL) daily  #30 x 3   Entered and Authorized by:   Ancil Boozer  MD   Signed by:   Ancil Boozer  MD on 10/15/2009   Method used:   Electronically to        [facility] (retail)       [address]       [place], [place]  [address]       Ph: [phone]       Fax: [phone]   RxID:   [id number] FLONASE 50 MCG/ACT SUSP (FLUTICASONE PROPIONATE) 2 sprays each nostril daily  #1 x 3   Entered and Authorized by:   Ancil Boozer  MD   Signed by:   Ancil Boozer  MD on 10/15/2009   Method used:   Electronically to        [facility] (retail)       [address]       [place], [place]  [address]       Ph: [phone]       Fax: [phone]   RxID:   [id number] FUROSEMIDE 20 MG TABS (FUROSEMIDE) 1 by mouth QAM for excess fluid  #30 x 1   Entered and Authorized by:   Ancil Boozer  MD   Signed by:   Ancil Boozer  MD on 10/15/2009   Method used:   Electronically to        [facility] (retail)       [address]       [place], [place]  [address]       Ph: [phone]       Fax: [phone]   RxID:   [id number] TRIAMCINOLONE ACETONIDE 0.5 % CREA (TRIAMCINOLONE ACETONIDE) apply to itchy areas two times a day as needed.  Disp 60g tube  #60 x 1   Entered and Authorized by:   Ancil Boozer  MD   Signed by:  Ancil Boozer  MD on 10/15/2009   Method used:   Electronically to        [facility] (retail)       [address]       [place], [place]  [address]       Ph: [phone]       Fax: [phone]   RxID:   [id number]

## 2010-05-23 NOTE — Progress Notes (Signed)
Summary: phn msg  Phone Note Call from Patient Call back at [phone]   Caller: [name] [name] Summary of Call: Wanted to speak to Dr. Sandi Mealy about some medication that one of her other doctor's gave her and how it will affect her bp and sugar. Initial call taken by: Clydell Hakim,  October 02, 2009 12:22 PM  Follow-up for Phone Call        Left message with woman for [name] [name] to call back. Follow-up by: Garen Grams LPN,  October 03, 2009 3:10 PM  Additional Follow-up for Phone Call Additional follow up Details #1::        Lmom for pt to return call, will await callback from patient. Additional Follow-up by: Garen Grams LPN,  October 05, 2009 9:26 AM

## 2010-05-23 NOTE — Miscellaneous (Signed)
Summary: Medical record request  Clinical Lists Changes  Rec'd medical record request to go to: [employer] date sent: 9/201/11 Marily Memos  March 19, 2010 3:25 PM

## 2010-05-23 NOTE — Assessment & Plan Note (Signed)
Summary: needs DM medication/eo   Vital Signs:  Patient profile:   60 year old female Height:      63.5 inches Weight:      210 pounds BMI:     36.75 Temp:     99.5 degrees F oral Pulse rate:   86 / minute BP sitting:   142 / 71  (right arm) Cuff size:   large  Vitals Entered By: Jimmy Footman, CMA (January 18, 2010 10:52 AM) CC: rash on face & back x2 weeks. anxiety, headaches Is Patient Diabetic? Yes Did you bring your meter with you today? No   Primary Care Provider:  [name] MD  CC:  rash on face & back x2 weeks. anxiety and headaches.  History of Present Illness: 60 yo F; please see extensive previous note for more details:  1. Rash: x 2 weeks, face, arms, back. Several medications were adjusted/discontinued in order to evaluate etiology. Using OTC hydrocortisone cream on rash. Nothing has helped. + itchy.  2. Anxiety: SEVERE. Was started on Klonopin at last visit, but has not been taking it because a whole pill caused sedation. Patient crying during most of visit. Focused on her anger and depression/anxiety surrounding her workman's comp and PT. She feels that she is not supported by the doctor's in [place]. She denies SI/HI. She endorses auditory hallucinations - usually water sound, but sometimes people talking.  3. HA: Generalized. Chronic. On Topamax. No change. No vision changes.   Denies fever/chills, CP, SOB, N/V/D, LE edema.  Habits & Providers  Alcohol-Tobacco-Diet     Tobacco Status: current     Cigarette Packs/Day: <0.25  Current Medications (verified): 1)  Cetirizine Hcl 10 Mg Tabs (Cetirizine Hcl) .... One Daily 2)  Nystatin 100000 Unit/gm Powd (Nystatin) .... Apply To Lower Abdomen Two Times A Day As Needed. Disp 1 Bottle 3)  Adult Diapers .... As Needed.  Disp Enough For Three Times A Day Use For 1 Month. 4)  Onetouch Test  Strp (Glucose Blood) .... Dispense Quantity Sufficient For Twice Daily Blood Glucose Testing. 5)  Onetouch Delica  Lancets  Misc (Lancets) .... Dispense Quantity Sufficient For Twice Daily Testing. 6)  Prozac 40 Mg Caps (Fluoxetine Hcl) .... One Daily 7)  Clonazepam 0.5 Mg Tabs (Clonazepam) .... One Two Times A Day 8)  Topiramate 50 Mg Tabs (Topiramate) .... One Daily  Allergies (verified): 1)  Pristiq (Desvenlafaxine Succinate) PMH-FH-SH reviewed for relevance  Social History: Packs/Day:  <0.25  Review of Systems      See HPI  Physical Exam  General:  Very anxious AA female, shaking she is so upset. Vitals reviewed. Head:  Normocephalic and atraumatic.   Eyes:  PERRL. EOMI. Ears:  R ear normal and L ear normal.   Mouth:  MMM. Neck:  Supple, full ROM, and no masses.   Skin:  Few dry patches of skin on upper neck. No noticed rash on face or arms. Psych:  Not suicidal, severely anxious, easily distracted, poor concentration, and angry at times.   Impression & Recommendations:  Problem # 1:  PRURITUS (ICD-698.9) Assessment Unchanged No rash identified. Reassured patient that she may take her medications. Advised non-dye, non-perfume creams as needed for dry skin and itching.  Problem # 2:  DEPRESSION, MAJOR, WITH PSYCHOTIC BEHAVIOR (ICD-298.0) Assessment: Unchanged Discussed with Luretha Murphy and Paulino Rily. Rec restarting Klonopin - 1 at hs and 1/2 in am (or none in am if too sedating). Patient to try again. Increased Topamax for HA  PPx as well as mood stabilizing effects. Need to be careful re: metabolic effects of Seroquel and family. Psych follow-up is essential here. Made sure to let patient know that [facility] is a safe environment. She should have weekly appointments for follow-up until stable. Okay to see me, Luretha Murphy, or PCP.  Problem # 3:  HEADACHE (ICD-784.0) Assessment: Deteriorated Increase Topamax to 100 mg by mouth daily.  Problem # 5:  HEADACHE (ICD-784.0)  Complete Medication List: 1)  Cetirizine Hcl 10 Mg Tabs (Cetirizine hcl) .... One daily 2)  Nystatin 100000 Unit/gm Powd  (Nystatin) .... Apply to lower abdomen two times a day as needed. disp 1 bottle 3)  Adult Diapers  .... As needed.  disp enough for three times a day use for 1 month. 4)  Onetouch Test Strp (Glucose blood) .... Dispense quantity sufficient for twice daily blood glucose testing. 5)  Onetouch Delica Lancets Misc (Lancets) .... Dispense quantity sufficient for twice daily testing. 6)  Prozac 40 Mg Caps (Fluoxetine hcl) .... One daily 7)  Clonazepam 0.5 Mg Tabs (Clonazepam) .... One two times a day 8)  Topamax 100 Mg Tabs (Topiramate) .... One by mouth daily  Patient Instructions: 1)  Please follow up weekly with PCP, Luretha Murphy, or Dr. Earlene Plater. 2)  Increase the Topomax to 100 mg by mouth daily. 3)  Take 1/2 Klonopin in the morning and 1 pill at night.  4)  Use a non-dye, non-perfume cream to your dry skin. This should help with itching and dryness. Brands to consider: Aveeno, CeraVe, Cetaphil. 5)  We will discuss your diabetes at the next visit. Prescriptions: TOPAMAX 100 MG TABS (TOPIRAMATE) one by mouth daily  #30 x 0   Entered and Authorized by:   Helane Rima DO   Signed by:   Helane Rima DO on 01/21/2010   Method used:   Historical   RxID:   [id number] PROZAC 40 MG CAPS (FLUOXETINE HCL) one daily  #30 x 3   Entered and Authorized by:   Helane Rima DO   Signed by:   Helane Rima DO on 01/18/2010   Method used:   Electronically to        [facility]* (retail)       [address]       [address] Shipping/mailing       [place], [place]  [address]       Ph: [phone]       Fax: [phone]   RxID:   [id number]   Appended Document: Orders Update    Clinical Lists Changes  Orders: Added new Test order of [facility]- Est  Level 4 (88416) - Signed

## 2010-05-23 NOTE — Assessment & Plan Note (Signed)
Summary: f/u eo   Vital Signs:  Patient profile:   60 year old female Weight:      201.23 pounds Pulse rate:   100 / minute BP sitting:   164 / 88  (right arm) Cuff size:   large  Vitals Entered By: Arlyss Repress CMA, (March 04, 2010 1:58 PM) CC: epigastric pain. pt took 2 dulcolax on thursday and had BM, has not had a BM since then. Is Patient Diabetic? Yes Pain Assessment Patient in pain? yes     Location: abdomen Intensity: 8 Onset of pain  x thursday   Primary Care Provider:  Helane Rima DO  CC:  epigastric pain. pt took 2 dulcolax on thursday and had BM and has not had a BM since then..  History of Present Illness: [name] [name] is here for her weekely visit, her primary MD is out of town.  Last visit she was having GI smptoms of reflux and constipation.  She was started on PEG which she has picked up and is using, she also purchased Dulcolox tabs.  She still is not moving her bowels.  Per her history Dr Loreta Ave did a colonoscopy in 2007, we do not have the report and she does not remember the outcome.  Her symptoms today are the same, moving pain over her bowels with a "drawing up"  and no BM for 4 days.  We discussed focus on feeling well, and becomming healty.  Her A1C was 8.7% and she is not on any agents yet to treat her diabetes.  Habits & Providers  Alcohol-Tobacco-Diet     Tobacco Status: current     Tobacco Counseling: to quit use of tobacco products  Current Medications (verified): 1)  Nystatin 100000 Unit/gm Powd (Nystatin) .... Apply To Lower Abdomen Two Times A Day As Needed. Disp 1 Bottle 2)  Adult Diapers .... As Needed.  Disp Enough For Three Times A Day Use For 1 Month. 3)  Onetouch Test  Strp (Glucose Blood) .... Dispense Quantity Sufficient For Twice Daily Blood Glucose Testing. 4)  Onetouch Delica Lancets  Misc (Lancets) .... Dispense Quantity Sufficient For Twice Daily Testing. 5)  Prozac 40 Mg Caps (Fluoxetine Hcl) .... One Daily 6)  Seroquel 200  Mg Tabs (Quetiapine Fumarate) .... 1/2 By Mouth Q Hs 7)  Ambien 10 Mg Tabs (Zolpidem Tartrate) .... One By Mouth Q Hs 8)  Prilosec 20 Mg Cpdr (Omeprazole) .... One By Mouth Bid 9)  Lisinopril-Hydrochlorothiazide 20-25 Mg Tabs (Lisinopril-Hydrochlorothiazide) .... One Daily 10)  Meloxicam 15 Mg Tabs (Meloxicam) .... One By Mouth Daily 11)  Anusol-Hc 2.5 % Crea (Hydrocortisone) .... Per Box Instructions 12)  Polyethylene Glycol 3350  Powd (Polyethylene Glycol 3350) .Marland KitchenMarland KitchenMarland Kitchen 17 Gm in 4-6 Oz Water and Drink Daily, Qs  Allergies (verified): 1)  Pristiq (Desvenlafaxine Succinate)  Review of Systems      See HPI  Physical Exam  General:  She was alert, anxious, and moved better that in the past, was able to get on and off the exam table without assistance. Abdomen:  Large, central buldging that was reducable, hyperactive bowel sounds, diffuse tenderness, unable to appreciste any masses Rectal:  refused rectal exam   Impression & Recommendations:  Problem # 1:  CONSTIPATION (ICD-564.00) She will try to eat several servings of fresh fruit daily, she will continue to use the PEG, and may use the rest of her laxative to get her bowels moving.  We tried to make apt wth Dr. Loreta Ave but they would not  see her, the staff is working on this. Her updated medication list for this problem includes:    Polyethylene Glycol 3350 Powd (Polyethylene glycol 3350) .Marland KitchenMarland KitchenMarland KitchenMarland Kitchen 17 gm in 4-6 oz water and drink daily, qs  Orders: Gastroenterology Referral (GI) [facility]- Est Level  3 ([address]109)  Problem # 2:  GASTROESOPHAGEAL REFLUX DISEASE, CHRONIC (ICD-530.81)  Her updated medication list for this problem includes:    Prilosec 20 Mg Cpdr (Omeprazole) ..... One by mouth bid  Orders: Gastroenterology Referral (GI)  Problem # 3:  DIABETES MELLITUS, TYPE II (ICD-250.00) May be ready to restart meds, A1C is 8.7%  did not tolerate metformin in the past. Her updated medication list for this problem includes:     Lisinopril-hydrochlorothiazide 20-25 Mg Tabs (Lisinopril-hydrochlorothiazide) ..... One daily  Orders: A1C-[facility] (60454)  Complete Medication List: 1)  Nystatin 100000 Unit/gm Powd (Nystatin) .... Apply to lower abdomen two times a day as needed. disp 1 bottle 2)  Adult Diapers  .... As needed.  disp enough for three times a day use for 1 month. 3)  Onetouch Test Strp (Glucose blood) .... Dispense quantity sufficient for twice daily blood glucose testing. 4)  Onetouch Delica Lancets Misc (Lancets) .... Dispense quantity sufficient for twice daily testing. 5)  Prozac 40 Mg Caps (Fluoxetine hcl) .... One daily 6)  Seroquel 200 Mg Tabs (Quetiapine fumarate) .... 1/2 by mouth q hs 7)  Ambien 10 Mg Tabs (Zolpidem tartrate) .... One by mouth q hs 8)  Prilosec 20 Mg Cpdr (Omeprazole) .... One by mouth bid 9)  Lisinopril-hydrochlorothiazide 20-25 Mg Tabs (Lisinopril-hydrochlorothiazide) .... One daily 10)  Meloxicam 15 Mg Tabs (Meloxicam) .... One by mouth daily 11)  Anusol-hc 2.5 % Crea (Hydrocortisone) .... Per box instructions 12)  Polyethylene Glycol 3350 Powd (Polyethylene glycol 3350) .Marland KitchenMarland Kitchen. 17 gm in 4-6 oz water and drink daily, qs  Other Orders: Influenza Vaccine NON MCR (09811)  Patient Instructions: 1)  Take Dulcolox at bedtime, two tabs until its gone 2)  Increase miralax to two times a day 3)  Pick up a fleets enema and use if you feel hard stool in your rectum that will not come out-then use the fleets 4)  We will call with GI doctor referral 5)  GI referral is for reflux, your abdominal pain, and severe constipation. 6)  Please schedule a follow-up appointment in 1 weeks.  Prescriptions: LISINOPRIL-HYDROCHLOROTHIAZIDE 20-25 MG TABS (LISINOPRIL-HYDROCHLOROTHIAZIDE) one daily  #90 x 1   Entered and Authorized by:   Luretha Murphy NP   Signed by:   Luretha Murphy NP on 03/04/2010   Method used:   Electronically to        [facility]* (retail)       [address]        [address] Shipping/mailing       [place], [place]  [address]       Ph: [phone]       Fax: [phone]   RxID:   [id number]    Orders Added: 1)  A1C-[facility] [83036] 2)  Influenza Vaccine NON MCR [00028] 3)  Gastroenterology Referral [GI] 4)  [facility]- Est Level  3 [66440]   Immunizations Administered:  Influenza Vaccine # 1:    Vaccine Type: Fluvax Non-MCR    Site: left deltoid    Mfr: GlaxoSmithKline    Dose: 0.5 ml    Route: IM    Given by: Tessie Fass CMA    Exp. Date: 10/19/2010    Lot #:  [id number]    VIS given: 11/13/09 version given March 04, 2010.  Flu Vaccine Consent Questions:    Do you have a history of severe allergic reactions to this vaccine? no    Any prior history of allergic reactions to egg and/or gelatin? no    Do you have a sensitivity to the preservative Thimersol? no    Do you have a past history of Guillan-Barre Syndrome? no    Do you currently have an acute febrile illness? no    Have you ever had a severe reaction to latex? no    Vaccine information given and explained to patient? yes    Are you currently pregnant? no   Immunizations Administered:  Influenza Vaccine # 1:    Vaccine Type: Fluvax Non-MCR    Site: left deltoid    Mfr: GlaxoSmithKline    Dose: 0.5 ml    Route: IM    Given by: Tessie Fass CMA    Exp. Date: 10/19/2010    Lot #: [id number]    VIS given: 11/13/09 version given March 04, 2010.  Laboratory Results   Blood Tests   Date/Time Received: March 04, 2010 2:01 PM  Date/Time Reported: March 04, 2010 2:21 PM   HGBA1C: 8.5%   (Normal Range: Non-Diabetic - 3-6%   Control Diabetic - 6-8%)  Comments: ...............test performed by......Marland KitchenBonnie A. [place], MLS (ASCP)cm       Prevention & Chronic Care Immunizations   Influenza vaccine: Fluvax Non-MCR  (03/04/2010)   Influenza vaccine due: 01/23/2009    Tetanus booster: 07/20/2005: Done.   Tetanus booster due: 07/21/2015    Pneumococcal  vaccine: Not documented    H. zoster vaccine: Not documented  Colorectal Screening   Hemoccult: Not documented   Hemoccult action/deferral: Not indicated  (09/14/2009)    Colonoscopy: Done.  (08/19/2005)   Colonoscopy due: 08/20/2015  Other Screening   Pap smear: normal  (08/10/2007)   Pap smear action/deferral: Not indicated S/P hysterectomy  (11/13/2008)   Pap smear due: 08/09/2008    Mammogram: Done.  (07/21/1998)   Mammogram due: 07/21/1999    DXA bone density scan: Not documented   Smoking status: current  (03/04/2010)   Smoking cessation counseling: yes  (06/14/2008)  Diabetes Mellitus   HgbA1C: 8.5  (03/04/2010)    Eye exam: Not documented    Foot exam: yes  (12/07/2009)   High risk foot: Not documented   Foot care education: Not documented    Urine microalbumin/creatinine ratio: Not documented  Lipids   Total Cholesterol: 200  (08/10/2007)   LDL: 91  (08/10/2007)   LDL Direct: Not documented   HDL: 51  (08/10/2007)   Triglycerides: 290  (08/10/2007)  Hypertension   Last Blood Pressure: 164 / 88  (03/04/2010)   Serum creatinine: 0.70  (01/28/2010)   Serum potassium 4.3  (01/28/2010)  Self-Management Support :   Personal Goals (by the next clinic visit) :     Personal A1C goal: 8  (09/14/2009)     Personal blood pressure goal: 130/80  (09/14/2009)     Personal LDL goal: 100  (09/14/2009)    Diabetes self-management support: Written self-care plan  (02/06/2010)    Diabetes self-management support not done because: Not indicated  (09/14/2009)    Hypertension self-management support: Written self-care plan  (02/06/2010)    Hypertension self-management support not done because: Good outcomes  (09/14/2009)

## 2010-05-23 NOTE — Progress Notes (Signed)
Summary: phn msg  Phone Note Call from Patient Call back at Home Phone [phone]   Caller: [name] [name] Summary of Call: Needs to know what time she is to have her echo cardiogram. Initial call taken by: Clydell Hakim,  November 12, 2009 2:20 PM  Follow-up for Phone Call        Patients [name] [name]) son informed. Follow-up by: Garen Grams LPN,  November 12, 2009 2:31 PM

## 2010-05-23 NOTE — Progress Notes (Signed)
  Phone Note Call from Patient   Call For: [phone] or [phone] Summary of Call: Need provider to call pt asap.  Emergency call necessary.   Appt scheduled with Dr. Loreta Ave was cancelled by Dr. Cristal Ford in hospital.  Pt was not aware u ntil she called Dr. Kenna Gilbert office a few m inutes ago.   The appt was for next week.  Very upset and angry that this physician would take it upon herself to cancel that appt. Initial call taken by: Abundio Miu,  April 23, 2010 11:34 AM  Follow-up for Phone Call        Spoke with patient. Reassured that we will look into the issue and get back to her. She would like for appointment to be made with Dr.Mann [phone]). I will send to Dr. Cristal Ford to see if she knows anything about this.  Follow-up by: Helane Rima DO,  April 23, 2010 11:45 AM  Additional Follow-up for Phone Call Additional follow up Details #1::        As documented in discharge summary, this patient was established in the hospital with [facility] gastroenteology who agreed to assume her care since the patient had never seen Dr. Loreta Ave at the outside practice previously. Therefore, we cancelled her future scheduled appointment with Dr. Loreta Ave. The plan was to follow up with [facility] for colonoscopy as indicated.  Additional Follow-up by: Lloyd Huger MD,  April 23, 2010 1:32 PM    Additional Follow-up for Phone Call Additional follow up Details #2::    RED TEAM: Please see above. Please make sure that this patient has an appointment with [facility] vs Dr. Loreta Ave and call her with appointment time. Follow-up by: Helane Rima DO,  April 24, 2010 2:14 PM  Additional Follow-up for Phone Call Additional follow up Details #3:: Details for Additional Follow-up Action Taken: called pt, left message with family member for [facility] to return call. she needs to call the billing office for [facility] at [phone]. then she may call their office and schedule her f/u appt at [phone]. Additional Follow-up by:  Tessie Fass CMA,  April 25, 2010 11:09 AM

## 2010-05-23 NOTE — Progress Notes (Signed)
Summary: Rx Ques  Phone Note Call from Patient Call back at [facility] Phone [phone]   Caller: Patient Summary of Call: Has questions about new medication that Dr. Jeannine Kitten put her on Prestiq and the medication she is now taking perscribed by Dr. Sandi Mealy. Initial call taken by: Clydell Hakim,  October 19, 2009 11:41 AM  Follow-up for Phone Call        patient wants to make sure that the Pristeq is ok to take with the other meds she is currently on.  she forgot to ask MD at her appointment this week. Pristeq was given to her week before last and she has not started yet. Follow-up by: Theresia Lo RN,  October 19, 2009 11:49 AM  Additional Follow-up for Phone Call Additional follow up Details #1::        Pristiq will be fine for her.   Additional Follow-up by: Ancil Boozer  MD,  October 20, 2009 8:53 PM    Additional Follow-up for Phone Call Additional follow up Details #2::    patient notified. Follow-up by: Theresia Lo RN,  October 23, 2009 8:55 AM  New/Updated Medications: PRISTIQ 50 MG XR24H-TAB (DESVENLAFAXINE SUCCINATE) unknown dose per psychiatry

## 2010-05-23 NOTE — Progress Notes (Signed)
Summary: re: Dr.Mann/ts  Phone Note Call from Patient   Caller: Patient Call For: [phone] Summary of Call: Pt called Dr. Kenna Gilbert office and was told to have [place] call back and ask for "Miss Tray" and she will give info to the nurse so pat can see Dr. Loreta Ave.  There ph# is [phone] .  Call pat back after 3:30 to let her know what they said. Initial call taken by: Abundio Miu,  March 05, 2010 9:32 AM  Follow-up for Phone Call        see previous message re: Dr.Mann Follow-up by: Arlyss Repress CMA,,  March 05, 2010 5:39 PM

## 2010-05-23 NOTE — Progress Notes (Signed)
Summary: triage  Phone Note Call from Patient Call back at [phone]   Caller: Patient Summary of Call: Pt having trouble with her medications and she is falling. Initial call taken by: Clydell Hakim,  August 14, 2009 10:02 AM  Follow-up for Phone Call        patient reports when she walks she has pain in left side of head. has shaking of both hands and left leg gives way , has some numbness in toes on left foot. this all started after an ESI injection last Friday  ( 08/10/2009 ) by Dr. Rush Farmer. she did contact their office and was told she may have some leakage of fluid  in her body. told her to be on bedrest and drink caffine liquids. today she went to see  Dr. Dione Booze who did eye surgery the Monday prior  ( 08/06/2009 ) and she almost fell at their office. appointment scheduled for [place] this afternoon. offered appointment earlier but she cannot arrange transportation before this afternoon. advised her to contact Dr. Osborne Oman in the meantime to report also. Follow-up by: Theresia Lo RN,  August 14, 2009 10:38 AM

## 2010-05-23 NOTE — Miscellaneous (Signed)
Summary: worker's comp/pain ctr/ts  called pt's worker's comp co-ordinator [employer] @ [phone].  pt is being seen in [place] for her pain management. I explained to A.Katrinka Blazing, that the pt and also her PCP which is Dr.Wallace would like for the pt to be seen in [place] for Pain Management. When I told A.Katrinka Blazing that I would get Dr.Wallace on the phone to explain things better, she hung up the phone. not sure. called A.Smith back and lmvm to call back to speak to me or Dr.Wallace. waiting on call back .Arlyss Repress CMA,  April 30, 2010 11:06 AM  A.Katrinka Blazing called back. She reports, that pt has been doing well and made great improvements. She is getting transportation for [place] and they were talking about releasing her due to her improvement.  CALLED PT and request for pt to have [facility] to fax her information to Dr.Wallace, so she can be informed of her treatment and plan. Pt will have appt today in [place]. I spoke with pt's son and gave him the FAX number and they will make sure, that the notes and treatment plan information will be faxed to Dr.Wallace. Arlyss Repress CMA,  April 30, 2010 12:06 PM

## 2010-05-23 NOTE — Progress Notes (Signed)
Summary: Rx not at pharmacy  Phone Note Call from Patient Call back at Home Phone [phone]   Reason for Call: Talk to Nurse Summary of Call: pt seen today, rxs still not at pharmacy Initial call taken by: Knox Royalty,  March 26, 2010 4:11 PM  Follow-up for Phone Call         [place] CMA states he faxed RX and then gave the hard copy to patient.  after patient left it was noted that fax did not go thru. patient will look for hard copy . her granddaughter has the papers she was given today and she is not home at present. she will call back if there is a problem. Follow-up by: Theresia Lo RN,  March 26, 2010 4:56 PM

## 2010-05-23 NOTE — Progress Notes (Signed)
  Phone Note Call from Patient   Caller: Son Call For: [phone] Summary of Call: She's having ? reaction to medicine. Complaining of burning sensation to skin.  Pt has been taking Benadryl for last 2 days.  Also rash all over face.  Pt is presently in [place] at a pain clinic.  Won't be back until tomorrow.  Need to see a doctor on Monday.  Son's name is Aurther Loft.  If you receive no answer.  Please lv msg and will return call. Initial call taken by: Britta Mccreedy mcgregor  Follow-up for Phone Call        spoke with son. she has an appt here in am. told him to take her to a local UC. he has no way to get there & is not familiar with the city. wants to wait until tomorrow. states the bendryl is helping. told him if she got worse-difficulty breathing or other worrisome issues, go t ED. he agreed Follow-up by: Golden Circle RN,  January 03, 2010 10:48 AM

## 2010-05-23 NOTE — Assessment & Plan Note (Signed)
Summary: hospital f/u per konkol/eo   Vital Signs:  Patient profile:   60 year old female Height:      63.5 inches Weight:      191 pounds BMI:     33.42 Temp:     98.7 degrees F oral BP sitting:   138 / 70  (left arm) Cuff size:   large  Vitals Entered By: Tessie Fass CMA (April 05, 2010 10:11 AM) CC: hospital f/u - abdominal pain Is Patient Diabetic? Yes Pain Assessment Patient in pain? yes     Location: side pain Intensity: 6   Primary Care Provider:  Helane Rima DO  CC:  hospital f/u - abdominal pain.  History of Present Illness: 60 yo F:  1. Abdominal Pain: Patient recently hospitalized for this issue. DC Summary reviewed. On admission, the patient was noted to have diffuse abdominal pain with N/V. CT with mild stranding in the area of the duodenum and pancreas, with a mildly elevated lipase of 132. The patient underwent imaging studies which ruled out cause for pancreatitis as she has had prior cholecystectomy and no evidence of common bile duct stone or history of alcohol use.  She was also ruled out for upper GI bleed with direct visualization of her upper GI tract.  Her liver function testing, white blood count, and Hgb remained stable during her hospitalization and cause for her abdominal pain was unable to be identified.  She does have a ventral wall hernia which appears to be chronic innature and is not acutely strangulated.  Additionally, she could have shown components of constipation and perhaps irritable bowel syndrome which are contributing to her severe nonspecific abdominal pain.  The patient was initially treated with IV opiates and was transitioned to oral Percocet and given a very short course of thismedication to take at time of discharge.  She is also sent home with a multiagent bowel regimen including Colace, MiraLax, and senna, as well as glycerin suppositories for a history of constipation and external hemorrhoids.  She is following up with outpatient  GI for colonoscopy.  2. DM: Consistently in the 200s. Eating more regularly. Weight stable today.  Current Medications (verified): 1)  Adult Diapers .... As Needed.  Disp Enough For Three Times A Day Use For 1 Month. 2)  Onetouch Test  Strp (Glucose Blood) .... Dispense Quantity Sufficient For Twice Daily Blood Glucose Testing. 3)  Onetouch Delica Lancets  Misc (Lancets) .... Dispense Quantity Sufficient For Twice Daily Testing. 4)  Prozac 40 Mg Caps (Fluoxetine Hcl) .... One Daily 5)  Seroquel 200 Mg Tabs (Quetiapine Fumarate) .... 1/2 By Mouth Q Hs 6)  Ambien 10 Mg Tabs (Zolpidem Tartrate) .... One By Mouth Q Hs 7)  Nexium 40 Mg Cpdr (Esomeprazole Magnesium) .... Take 1 Tab Each Morning 8)  Lisinopril-Hydrochlorothiazide 20-25 Mg Tabs (Lisinopril-Hydrochlorothiazide) .... One Daily 9)  Meloxicam 15 Mg Tabs (Meloxicam) .... One By Mouth Daily 10)  Anusol-Hc 2.5 % Crea (Hydrocortisone) .... Per Box Instructions 11)  Polyethylene Glycol 3350  Powd (Polyethylene Glycol 3350) .Marland KitchenMarland KitchenMarland Kitchen 17 Gm in 4-6 Oz Water and Drink Daily, Qs 12)  Promethazine Hcl 25 Mg  Tabs (Promethazine Hcl) .... One By Mouth Two Times A Day As Needed Nausea 13)  Oxycodone-Acetaminophen 5-325 Mg Tabs (Oxycodone-Acetaminophen) .Marland Kitchen.. 1 By Mouth Up To 4 Times Per Day As Needed For Pain 14)  Glimepiride 1 Mg Tabs (Glimepiride) .Marland Kitchen.. 1 By Mouth Daily  Allergies (verified): 1)  Pristiq (Desvenlafaxine Succinate)  Review of Systems General:  Denies chills and fever. CV:  Denies chest pain or discomfort, palpitations, and shortness of breath with exertion. GI:  Complains of abdominal pain and nausea; denies bloody stools, constipation, diarrhea, and vomiting.  Physical Exam  General:  Well-developed, well-nourished, in no acute distress; alert, appropriate and cooperative throughout examination. Vitlas reviewed. Lungs:  Lungs clear to auscultation bilaterally.  Heart:  II/VI systolic murmur.  Abdomen:  Soft, normal bowel sounds,  no masses, no guarding, and no rebound tenderness.  Generalized TTP, > epigastric and RUQ. Pulses:  2+ bilateral DP pulses. Extremities:  No edema.   Impression & Recommendations:  Problem # 1:  ABDOMINAL PAIN OTHER SPECIFIED SITE (ICD-789.09) Assessment Unchanged  Unknown cause. No red flags.  Patient to follow-up with GI. Continue current medication regimen. Her updated medication list for this problem includes:    Meloxicam 15 Mg Tabs (Meloxicam) ..... One by mouth daily    Oxycodone-acetaminophen 5-325 Mg Tabs (Oxycodone-acetaminophen) .Marland Kitchen... 1 by mouth up to 4 times per day as needed for pain  Orders: [facility]- Est  Level 4 (84132)  Problem # 2:  DIABETES MELLITUS, TYPE II (ICD-250.00) Assessment: Unchanged  Start Glimepiride. Continue to check CBGs. Her updated medication list for this problem includes:    Lisinopril-hydrochlorothiazide 20-25 Mg Tabs (Lisinopril-hydrochlorothiazide) ..... One daily    Glimepiride 1 Mg Tabs (Glimepiride) .Marland Kitchen... 1 by mouth daily  Orders: [facility]- Est  Level 4 (44010)  Complete Medication List: 1)  Adult Diapers  .... As needed.  disp enough for three times a day use for 1 month. 2)  Onetouch Test Strp (Glucose blood) .... Dispense quantity sufficient for twice daily blood glucose testing. 3)  Onetouch Delica Lancets Misc (Lancets) .... Dispense quantity sufficient for twice daily testing. 4)  Prozac 40 Mg Caps (Fluoxetine hcl) .... One daily 5)  Seroquel 200 Mg Tabs (Quetiapine fumarate) .... 1/2 by mouth q hs 6)  Ambien 10 Mg Tabs (Zolpidem tartrate) .... One by mouth q hs 7)  Nexium 40 Mg Cpdr (Esomeprazole magnesium) .... Take 1 tab each morning 8)  Lisinopril-hydrochlorothiazide 20-25 Mg Tabs (Lisinopril-hydrochlorothiazide) .... One daily 9)  Meloxicam 15 Mg Tabs (Meloxicam) .... One by mouth daily 10)  Anusol-hc 2.5 % Crea (Hydrocortisone) .... Per box instructions 11)  Polyethylene Glycol 3350 Powd (Polyethylene glycol 3350) .Marland KitchenMarland Kitchen. 17 gm in 4-6  oz water and drink daily, qs 12)  Promethazine Hcl 25 Mg Tabs (Promethazine hcl) .... One by mouth two times a day as needed nausea 13)  Oxycodone-acetaminophen 5-325 Mg Tabs (Oxycodone-acetaminophen) .Marland Kitchen.. 1 by mouth up to 4 times per day as needed for pain 14)  Glimepiride 1 Mg Tabs (Glimepiride) .Marland Kitchen.. 1 by mouth daily  Patient Instructions: 1)  It was nice to see you today! 2)  Follow up in 1-2 weeks. Prescriptions: GLIMEPIRIDE 1 MG TABS (GLIMEPIRIDE) 1 by mouth daily  #30 x 0   Entered and Authorized by:   Helane Rima DO   Signed by:   Helane Rima DO on 04/05/2010   Method used:   Electronically to        [facility]* (retail)       [address]       [address] Shipping/mailing       [place], [place]  [address]       Ph: [phone]       Fax: [phone]   RxID:   [id number] SEROQUEL 200 MG TABS (QUETIAPINE FUMARATE) 1/2 by mouth Q HS  #30  x 0   Entered and Authorized by:   Helane Rima DO   Signed by:   Helane Rima DO on 04/05/2010   Method used:   Print then Give to Patient   RxID:   [id number] PROMETHAZINE HCL 25 MG  TABS (PROMETHAZINE HCL) one by mouth two times a day as needed nausea  #30 x 0   Entered and Authorized by:   Helane Rima DO   Signed by:   Helane Rima DO on 04/05/2010   Method used:   Print then Give to Patient   RxID:   [id number] OXYCODONE-ACETAMINOPHEN 5-325 MG TABS (OXYCODONE-ACETAMINOPHEN) 1 by mouth up to 4 times per day as needed for pain  #20 x 0   Entered and Authorized by:   Helane Rima DO   Signed by:   Helane Rima DO on 04/05/2010   Method used:   Print then Give to Patient   RxID:   [id number]    Orders Added: 1)  [facility]- Est  Level 4 [53664]    Prevention & Chronic Care Immunizations   Influenza vaccine: Fluvax Non-MCR  (03/04/2010)   Influenza vaccine due: 01/23/2009    Tetanus booster: 07/20/2005: Done.   Tetanus booster due: 07/21/2015    Pneumococcal vaccine: Not  documented    H. zoster vaccine: Not documented  Colorectal Screening   Hemoccult: Not documented   Hemoccult action/deferral: Not indicated  (09/14/2009)    Colonoscopy: Done.  (08/19/2005)   Colonoscopy due: 08/20/2015  Other Screening   Pap smear: normal  (08/10/2007)   Pap smear action/deferral: Not indicated S/P hysterectomy  (11/13/2008)   Pap smear due: 08/09/2008    Mammogram: Done.  (07/21/1998)   Mammogram due: 07/21/1999    DXA bone density scan: Not documented   Smoking status: current  (03/26/2010)   Smoking cessation counseling: yes  (06/14/2008)  Diabetes Mellitus   HgbA1C: 8.5  (03/04/2010)    Eye exam: Not documented    Foot exam: yes  (12/07/2009)   High risk foot: Not documented   Foot care education: Not documented    Urine microalbumin/creatinine ratio: Not documented    Diabetes flowsheet reviewed?: Yes   Progress toward A1C goal: Unchanged  Lipids   Total Cholesterol: 214  (03/26/2010)   LDL: 123  (03/26/2010)   LDL Direct: Not documented   HDL: 45  (03/26/2010)   Triglycerides: 228  (03/26/2010)  Hypertension   Last Blood Pressure: 138 / 70  (04/05/2010)   Serum creatinine: 0.96  (03/26/2010)   Serum potassium 4.6  (03/26/2010)    Hypertension flowsheet reviewed?: Yes   Progress toward BP goal: Unchanged  Self-Management Support :   Personal Goals (by the next clinic visit) :     Personal A1C goal: 8  (09/14/2009)     Personal blood pressure goal: 130/80  (09/14/2009)     Personal LDL goal: 100  (09/14/2009)    Patient will work on the following items until the next clinic visit to reach self-care goals:     Medications and monitoring: take my medicines every day, bring all of my medications to every visit  (04/05/2010)     Eating: drink diet soda or water instead of juice or soda, eat more vegetables, use fresh or frozen vegetables, eat foods that are low in salt, eat baked foods instead of fried foods, eat fruit for snacks and  desserts, limit or avoid alcohol  (04/05/2010)     Activity: take a 30 minute walk every day, take  the stairs instead of the elevator, park at the far end of the parking lot  (04/05/2010)    Diabetes self-management support: Written self-care plan  (04/05/2010)   Diabetes care plan printed    Diabetes self-management support not done because: Not indicated  (09/14/2009)    Hypertension self-management support: Written self-care plan  (04/05/2010)   Hypertension self-care plan printed.    Hypertension self-management support not done because: Good outcomes  (09/14/2009)

## 2010-05-23 NOTE — Assessment & Plan Note (Signed)
Summary: feet swelling,df   Vital Signs:  Patient profile:   60 year old female Height:      63.5 inches Weight:      217 pounds BMI:     37.97 BSA:     2.02 O2 Sat:      98 % on Room air Temp:     99.2 degrees F Pulse rate:   101 / minute BP sitting:   150 / 85  Vitals Entered By: Jone Baseman CMA (Sep 10, 2009 1:40 PM)  O2 Flow:  Room air CC: swelling all over Is Patient Diabetic? No Pain Assessment Patient in pain? yes     Location: back and legs Intensity: 9   Primary Care Provider:  Ancil Boozer  MD  CC:  swelling all over.  History of Present Illness: swelling: has noticed since friday or saturday diffusely across body.  doesn't get better overnight.  associated with some shortness of breath.  denies big change in eating habits but reports overall not eating well - many sodas, etc - unsure how much salt.  in addition she continues to have trouble with urinary incontinence and some bladder pains.  she also has had a severe headache on the left side of her head that she reports has caused falls and bruises.  she describes the head pain as if her brain is "contracting."  she denies chest pain and denies palpitations.  she denies NSAID use.  she does report significant stress.   Habits & Providers  Alcohol-Tobacco-Diet     Tobacco Status: current     Tobacco Counseling: to quit use of tobacco products     Cigarette Packs/Day: 0.25  Current Medications (verified): 1)  Dyazide 37.5-25 Mg Caps (Triamterene-Hctz) .... Take 1 Capsule By Mouth Once A Day 2)  Alprazolam 1 Mg Tabs (Alprazolam) .... By Mouth At Bedtime Per Outside Doc 3)  Bromday 0.09 % Soln (Bromfenac Sodium) .Marland Kitchen.. 1 Drop L Eye Daily 4)  Omeprazole 20 Mg Cpdr (Omeprazole) .... Two Times A Day 5)  Besivance 0.6 % Susp (Besifloxacin Hcl) .Marland Kitchen.. 1 Drop Left Three Times A Day 6)  Flonase 50 Mcg/act Susp (Fluticasone Propionate) .... 2 Sprays Each Nostril Daily 7)  Cetirizine Hcl 10 Mg Tabs (Cetirizine Hcl)  .... Daily 8)  Prednisolone Acetate 1 % Susp (Prednisolone Acetate) .... Eye Drops 1 Drop Both Eyes Qid 9)  Nystatin 100000 Unit/gm Powd (Nystatin) .... Apply To Lower Abdomen Two Times A Day As Needed. Disp 1 Bottle 10)  Hydromorphone Hcl 4 Mg Tabs (Hydromorphone Hcl) .... Two Times A Day Per Pain Clinic 11)  Topiramate 25 Mg Tabs (Topiramate) .... 2 Tabs Three Times A Day Per Outside Clinic  Allergies (verified): No Known Drug Allergies  Past History:  PMH reviewed for relevance  Social History: Packs/Day:  0.25  Review of Systems       per HPI  Physical Exam  General:  alert, obese AAF in NAD but upset and tearful VS noted - hypertensive, tachycardic.  normal O2 sat Lungs:  Normal respiratory effort, chest expands symmetrically. Lungs are clear to auscultation, no crackles or wheezes. Heart:  Distant S1 and S2.  No mumurs noted Extremities:  2+ bilateral pitting edema lower extremities Psych:  Oriented X3.  anxious appearing, easily angered with labile affect   Impression & Recommendations:  Problem # 1:  EDEMA (ICD-782.3) Assessment New uncertain etiology. ddx includes: cardiac problem, liver problem (protein synthesis), kidney problem (protein, salt), drugs, glucose problem (?diabetes).  TSH  wnl recently. CBC WNL recently. ?cerebral problem  check basic labs today (if positive call [phone]) f/u soon - Friday AM consider depending on results: diuresis ECHO Head CT  Her updated medication list for this problem includes:    Dyazide 37.5-25 Mg Caps (Triamterene-hctz) .Marland Kitchen... Take 1 capsule by mouth once a day  Orders: Comp Met-[facility] [phone]) Urinalysis-[facility] (00000) B Nat Peptide-[facility] ([id number]) CBC-[facility] (52841) [facility]- Est  Level 4 (32440)  Complete Medication List: 1)  Dyazide 37.5-25 Mg Caps (Triamterene-hctz) .... Take 1 capsule by mouth once a day 2)  Alprazolam 1 Mg Tabs (Alprazolam) .... By mouth at bedtime per outside doc 3)  Bromday 0.09 % Soln  (Bromfenac sodium) .Marland Kitchen.. 1 drop l eye daily 4)  Omeprazole 20 Mg Cpdr (Omeprazole) .... Two times a day 5)  Besivance 0.6 % Susp (Besifloxacin hcl) .Marland Kitchen.. 1 drop left three times a day 6)  Flonase 50 Mcg/act Susp (Fluticasone propionate) .... 2 sprays each nostril daily 7)  Cetirizine Hcl 10 Mg Tabs (Cetirizine hcl) .... Daily 8)  Prednisolone Acetate 1 % Susp (Prednisolone acetate) .... Eye drops 1 drop both eyes qid 9)  Nystatin 100000 Unit/gm Powd (Nystatin) .... Apply to lower abdomen two times a day as needed. disp 1 bottle 10)  Hydromorphone Hcl 4 Mg Tabs (Hydromorphone hcl) .... Two times a day per pain clinic 11)  Topiramate 25 Mg Tabs (Topiramate) .... 2 tabs three times a day per outside clinic  Other Orders: Pulse Oximetry- [facility] (94760) A1C-[facility] (10272)  Patient Instructions: 1)  Please follow up with me Friday Morning (if necessary, double book) 2)  I will call you if I find something worrisome on your labs.   Laboratory Results   Urine Tests  Date/Time Received: Sep 10, 2009 2:20 PM  Date/Time Reported: Sep 10, 2009 3:53 PM   Routine Urinalysis   Color: yellow Appearance: Clear Glucose: negative   (Normal Range: Negative) Bilirubin: negative   (Normal Range: Negative) Ketone: negative   (Normal Range: Negative) Spec. Gravity: 1.010   (Normal Range: 1.003-1.035) Blood: negative   (Normal Range: Negative) pH: 7.0   (Normal Range: 5.0-8.0) Protein: negative   (Normal Range: Negative) Urobilinogen: 0.2   (Normal Range: 0-1) Nitrite: negative   (Normal Range: Negative) Leukocyte Esterace: negative   (Normal Range: Negative)    Comments: ...............test performed by......Marland KitchenBonnie A. [place], MLS (ASCP)cm   Blood Tests   Date/Time Received: Sep 10, 2009 2:20 PM  Date/Time Reported: Sep 10, 2009 3:52 PM   HGBA1C: 8.4%   (Normal Range: Non-Diabetic - 3-6%   Control Diabetic - 6-8%)  Comments: ...............test performed by......Marland KitchenBonnie A. [place], MLS  (ASCP)cm

## 2010-05-23 NOTE — Assessment & Plan Note (Signed)
Summary: medical clearance & cardiac eval,df   Vital Signs:  Patient profile:   60 year old female Height:      63.5 inches Weight:      210 pounds BMI:     36.75 BSA:     1.99 Temp:     98.6 degrees F Pulse rate:   116 / minute BP sitting:   141 / 82  Vitals Entered By: Jone Baseman CMA (December 07, 2009 10:54 AM) CC: Medical Clearance Is Patient Diabetic? No Pain Assessment Patient in pain? yes     Location: back and legs Intensity: 9   CC:  Medical Clearance.  Current Medications (verified): 1)  Alprazolam 1 Mg Tabs (Alprazolam) .... By Mouth At Bedtime Per Outside Doc 2)  Omeprazole 20 Mg Cpdr (Omeprazole) .... Two Times A Day 3)  Bepreve 1.5 % Soln (Bepotastine Besilate) .... 2 Drops 4 Times Daily. 4)  Flonase 50 Mcg/act Susp (Fluticasone Propionate) .... 2 Sprays Each Nostril Daily 5)  Cetirizine Hcl 10 Mg Tabs (Cetirizine Hcl) .... Daily 6)  Nystatin 100000 Unit/gm Powd (Nystatin) .... Apply To Lower Abdomen Two Times A Day As Needed. Disp 1 Bottle 7)  Topiramate 25 Mg Tabs (Topiramate) .... 2 Tabs Three Times A Day Per Outside Clinic 8)  Metformin Hcl 1000 Mg Tabs (Metformin Hcl) .Marland Kitchen.. 1 By Mouth Two Times A Day For Diabetes 9)  Ensure  Liqd (Nutritional Supplements) .Marland Kitchen.. 1 Drink Two Times A Day As Needed.  Disp 1 Case. 10)  Adult Diapers .... As Needed.  Disp Enough For Three Times A Day Use For 1 Month. 11)  Triamcinolone Acetonide 0.5 % Crea (Triamcinolone Acetonide) .... Apply To Itchy Areas Two Times A Day As Needed.  Disp 60g Tube 12)  Furosemide 20 Mg Tabs (Furosemide) .Marland Kitchen.. 1 By Mouth Qam For Excess Fluid 13)  Mupirocin 2 % Oint (Mupirocin) .... Apply As Needed To Fingers.  Disp 22g Tube  Allergies: 1)  Pristiq (Desvenlafaxine Succinate)   Complete Medication List: 1)  Alprazolam 1 Mg Tabs (Alprazolam) .... By mouth at bedtime per outside doc 2)  Omeprazole 20 Mg Cpdr (Omeprazole) .... Two times a day 3)  Bepreve 1.5 % Soln (Bepotastine besilate) ....  2 drops 4 times daily. 4)  Flonase 50 Mcg/act Susp (Fluticasone propionate) .... 2 sprays each nostril daily 5)  Cetirizine Hcl 10 Mg Tabs (Cetirizine hcl) .... Daily 6)  Nystatin 100000 Unit/gm Powd (Nystatin) .... Apply to lower abdomen two times a day as needed. disp 1 bottle 7)  Topiramate 25 Mg Tabs (Topiramate) .... 2 tabs three times a day per outside clinic 8)  Metformin Hcl 1000 Mg Tabs (Metformin hcl) .Marland Kitchen.. 1 by mouth daily for diabetes 9)  Ensure Liqd (Nutritional supplements) .Marland Kitchen.. 1 drink two times a day as needed.  disp 1 case. 10)  Adult Diapers  .... As needed.  disp enough for three times a day use for 1 month. 11)  Triamcinolone Acetonide 0.5 % Crea (Triamcinolone acetonide) .... Apply to itchy areas two times a day as needed.  disp 60g tube 12)  Furosemide 20 Mg Tabs (Furosemide) .Marland Kitchen.. 1 by mouth qam for excess fluid 13)  Mupirocin 2 % Oint (Mupirocin) .... Apply as needed to fingers.  disp 22g tube 14)  Glyburide 5 Mg Tabs (Glyburide) .... Take one tablet once a day.  Patient Instructions: 1)  I am going to approve you for rehab.  2)  Cut back on metformin to 1 tablet once a day.  Also going to add another medicine called glyburide (Glucotrol).  3)  I want you to make an appt @ our pharmacy clinic within the next few weeks to come up with a better plan for your diabetes. 4)  Before you go, I want you to get your A1c checked (sugars).  5)  Please follow-up in 2 wks.  Prescriptions: GLYBURIDE 5 MG TABS (GLYBURIDE) Take one tablet once a day.  #30 x 2   Entered and Authorized by:   Priscella Mann [name]   Signed by:   Lucianne Muss Park [name] on 12/07/2009   Method used:   Electronically to        [facility]* (retail)       [address]       [address] [employer]       [place], [place]  [address]       Ph: [phone]       Fax: [phone]   RxID:   [id number]   Appended Document: medical clearance & cardiac eval,df     Primary [name] [name]:   [name] [name]  CC:  Medical Clearance.  History of Present Illness: Son and nurse from [employer] comp came with pt today.   1. Medical Clearance Here to get medical clearance on pain rehabilitation program recommended by worker's comp. Pt doesn't want to go. Injured at work last year after sink fell on her. Hurt her back at that point. Has difficulty walking. Occ bladder incontinent (wears diaper), numbness in both feet (chronic problem), denies bowel incontinence. Larey Seat a few days ago while cooking in her kitchen and trying to turn around. Denies hitting head or dizziness/lightheadedness.  No new changes in vision in right eye (recently had surgery in that eye and vision has been blurry for a while). Denies H/A.  2. Diabetes On metformin x 2 months. Has been compliant but says makes her nauseous every time she takes it so doesn't want to take any more.  3. High BP Current dose of triamterene/hctz out of stock at her pharmacy [facility]. So has been given a different pill and takes half. Says it also makes her nauseous.    Allergies: 1)  Pristiq (Desvenlafaxine Succinate)  Review of Systems       Occ chest pain not associated with activity, occ heart palpitations, peripheral edema, occ muscle weakness bilateral legs.  Denies abd pain, hematuria, hematemesis, fever, blood in stool.  Physical Exam  General:  Appears sad/tired Head:  normocephalic and atraumatic.   Eyes:  PERRL, doesn't accommodate EOMI vision grossly intact.   Mouth:  pharynx pink and moist, no erythema, and no exudates.   Neck:  supple, full ROM, and no masses.   Lungs:  normal respiratory effort, normal breath sounds, and no dullness.   Heart:  RRR, II/VI holosystolic murmur heard best at R and LSB, does not radiate; no thrills/heaves Abdomen:  soft, non-tender, normal bowel sounds, no distention, no masses, no guarding, no rigidity, and no rebound tenderness.   Pulses:  2+ bilateral DP  pulses Extremities:  2-3+ bilateral pedal edema Neurologic:  cranial nerves II-XII intact.   strength 4/5 all extremities  Cervical Nodes:  no anterior cervical adenopathy and no posterior cervical adenopathy.   Psych:  not anxious appearing, not agitated, not suicidal, not homicidal, dysphoric affect, depressed affect, flat affect, subdued, withdrawn, and tearful.    Diabetes Management Exam:    Foot Exam (with socks and/or shoes not present):  Sensory-Pinprick/Light touch:          Left medial foot (L-4): normal          Left dorsal foot (L-5): diminished          Left lateral foot (S-1): diminished          Right medial foot (L-4): normal          Right dorsal foot (L-5): normal          Right lateral foot (S-1): normal       Inspection:          Left foot: normal          Right foot: normal       Nails:          Left foot: normal          Right foot: normal   Impression & Recommendations:  Problem # 1:  PHYSICAL EXAMINATION (ICD-V70.0)  Medically cleared to go to rehab. Only significant thing on PE is murmur. But recent ECHO shows no abnormalities (no aortic stenosis or mitral regurg, EF in 60's). Also flow murmur unlikely. Recent CBC shows not anemic. Think it will do her good to go to rehab. Will offer therapy and give psych counseling. Approved her to go.   Orders: [facility]- Est Level  3 ([address])  Problem # 2:  DIABETES MELLITUS, TYPE II (ICD-250.00) Cutting metformin from twice a day to once a day due to nausea. Also adding glyburide. Didn't just want to add glyburide due to studies showing better efficacy of metformin for diabetes. Pt also agreed to go to [facility] to get better diabetes control. Also checking A1c today.   Her updated medication list for this problem includes:    Metformin Hcl 1000 Mg Tabs (Metformin hcl) .Marland Kitchen... 1 by mouth daily for diabetes    Glyburide 5 Mg Tabs (Glyburide) .Marland Kitchen... Take one tablet once a day.  Orders: A1C-[facility] ([address]) [facility]- Est  Level  3 ([address])  Problem # 3:  HYPERTENSION, BENIGN ESSENTIAL (ICD-401.1) BP fine. No changes to her meds at this time. Did not want to make any more changes to her meds (changed her diabetes meds). See if the nausea resolves on this regimen. If she still has nausea in 2 wk follow-up and if she thinks due to her BP meds, will re-address. Her prescribed BP med gives her no problem, just the new one (?Maxide) that she takes half of.   Her updated medication list for this problem includes:    Furosemide 20 Mg Tabs (Furosemide) .Marland Kitchen... 1 by mouth qam for excess fluid  Complete Medication List: 1)  Alprazolam 1 Mg Tabs (Alprazolam) .... By mouth at bedtime per outside doc 2)  Omeprazole 20 Mg Cpdr (Omeprazole) .... Two times a day 3)  Bepreve 1.5 % Soln (Bepotastine besilate) .... 2 drops 4 times daily. 4)  Flonase 50 Mcg/act Susp (Fluticasone propionate) .... 2 sprays each nostril daily 5)  Cetirizine Hcl 10 Mg Tabs (Cetirizine hcl) .... Daily 6)  Nystatin 100000 Unit/gm Powd (Nystatin) .... Apply to lower abdomen two times a day as needed. disp 1 bottle 7)  Topiramate 25 Mg Tabs (Topiramate) .... 2 tabs three times a day per outside clinic 8)  Metformin Hcl 1000 Mg Tabs (Metformin hcl) .Marland Kitchen.. 1 by mouth daily for diabetes 9)  Ensure Liqd (Nutritional supplements) .Marland Kitchen.. 1 drink two times a day as needed.  disp 1 case. 10)  Adult Diapers  .... As needed.  disp  enough for three times a day use for 1 month. 11)  Triamcinolone Acetonide 0.5 % Crea (Triamcinolone acetonide) .... Apply to itchy areas two times a day as needed.  disp 60g tube 12)  Furosemide 20 Mg Tabs (Furosemide) .Marland Kitchen.. 1 by mouth qam for excess fluid 13)  Mupirocin 2 % Oint (Mupirocin) .... Apply as needed to fingers.  disp 22g tube 14)  Glyburide 5 Mg Tabs (Glyburide) .... Take one tablet once a day.  Laboratory Results   Blood Tests   Date/Time Received: December 07, 2009 12:33 PM  Date/Time Reported: December 07, 2009 12:42  PM     HGBA1C: 8.7%   (Normal Range: Non-Diabetic - 3-6%   Control Diabetic - 6-8%)  Comments: ...........test performed by.........Marland KitchenJone Baseman, CMA entered by Terese Door, CMA

## 2010-05-23 NOTE — Consult Note (Signed)
[facility]   Imported By: Clydell Hakim 11/21/2009 09:36:38  _____________________________________________________________________  External Attachment:    Type:   Image     Comment:   External Document

## 2010-05-23 NOTE — Assessment & Plan Note (Signed)
Summary: CHRONIS ACID REFLUX/FH   History of Present Illness Visit Type: consult Primary GI MD: Melvia Heaps MD [facility] Primary Provider: Ruthe Mannan  MD Requesting Provider: Ruthe Mannan MD Chief Complaint: acid reflux History of Present Illness:   [name] is a 60 year old Afro-American female referred through the courtesy of  Dr.Aron for evaluation of abdominal complaints.  She complains of burning chest and upper abdominal pain.  She has frequent belching and hiccuping.  She also complains of nausea.  She takes AcipHex 20 mg daily.  She had some choking when she swallows food no dysphasia, per se.  She is on the gastric irritants including nonsteroidals.  Upper endoscopy 2 years ago demonstrated gastritis.  Mrs. [name] [name] is also complaining of rectal bleeding.  She has seen bright red blood mixed with her stools.  She has a history of hemorrhoids.  Colonoscopy in 2007 showed internal hemorrhoids only.    GI Review of Systems    Reports abdominal pain, belching, bloating, heartburn, nausea, and  vomiting.     Location of  Abdominal pain: epigastric area.    Denies chest pain, dysphagia with liquids, dysphagia with solids, loss of appetite, vomiting blood, weight loss, and  weight gain.      Reports black tarry stools, constipation, diarrhea, hemorrhoids, rectal bleeding, and  rectal pain.     Denies anal fissure, change in bowel habit, diverticulosis, fecal incontinence, heme positive stool, irritable bowel syndrome, jaundice, light color stool, and  liver problems.     Prior Medications Reviewed Using: Patient Recall  Updated Prior Medication List: PROVENTIL HFA 108 (90 BASE) MCG/ACT  AERS (ALBUTEROL SULFATE) 1-2 puffs q four hours as needed. ACIPHEX 20 MG  TBEC (RABEPRAZOLE SODIUM) 1 tab by mouth daily AMBIEN CR 6.25 MG TBCR (ZOLPIDEM TARTRATE) Take 1 tablet by mouth at bedtime DYAZIDE 37.5-25 MG CAPS (TRIAMTERENE-HCTZ) Take 1 capsule by mouth once a day MECLIZINE HCL 12.5 MG TABS  (MECLIZINE HCL) Take 1 tablet by mouth twice a day DETROL LA 2 MG  CP24 (TOLTERODINE TARTRATE) 1 tablet by mouth daily  Current Allergies (reviewed today): No known allergies   Past Medical History:    Reviewed history from 06/18/2006 and no changes required:       L. arm pit abcess--I & D 4/01  Past Surgical History:    Reviewed history from 06/18/2006 and no changes required:       Cholecystectomy - 04/21/1997, tah and single oophorectomy--fibroids - 04/22/1991   Family History:    Reviewed history from 06/18/2006 and no changes required:       cad--daugher--mi age 41, dm--sister, hpt-sister  Social History:    Reviewed history from 06/18/2006 and no changes required:       Lives with daughter.  Works at [employer] or.; Smokes 5-6 cig/day for 20 years.; No alcohol or drugs    Review of Systems       The patient complains of urination changes/pain.         Review of systems is otherwise negative   Vital Signs:  Patient Profile:   60 Years Old Female Height:     63.5 inches (162.56 cm) Weight:      192.38 pounds BMI:     33.67 Pulse rate:   82 / minute Pulse rhythm:   regular BP sitting:   130 / 60  (left arm)  Vitals Entered By: Chales Abrahams CMA (March 01, 2008 9:38 AM)  Physical Exam  She is a well-developed well-nourished female  skin: anicteric HEENT: normocephalic; PEERLA; no nasal or pharyngeal abnormalities neck: supple nodes: no cervical lymphadenopathy chest: clear to ausculatation and percussion heart: no murmurs, gallops, or rubs abd: soft, nontender; BS normoactive; no abdominal masses, tenderness, organomegaly rectal: no masses; there are external hemorrhoids ext: no cynanosis, clubbing, edema skeletal: no deformities neuro: oriented x 3; no focal abnormalities     Impression & Recommendations:  Problem # 1:  GASTROESOPHAGEAL REFLUX DISEASE, CHRONIC (ICD-530.81) Assessment: Deteriorated Patient is having more reflux  symptoms despite [place] with AcipHex  Recommendations #1 patient will consider moment and a GERD trial.  Filling this with kapidex 60 mg a day  Problem # 2:  HEMORRHAGE OF RECTUM AND ANUS (ICD-569.3) This likely is due to hemorrhoidal bleeding.  Recommendations #1 Anusol HC suppositories   Patient Instructions: 1)  Warm soaks daily 2)  Stool softeners as needed  3)  Avoid spicy foods  4)  cc Dr. Ruthe Mannan    Prescriptions: ANUSOL-HC 25 MG SUPP (HYDROCORTISONE ACETATE) one suppository before retiring  #10 x 2   Entered by:   Merri Ray CMA   Authorized by:   Louis Meckel MD   Signed by:   Merri Ray CMA on 03/01/2008   Method used:   Electronically to        [id number] (retail)       [address]       [place], [place]  [address]       Ph: [phone] or [phone]       Fax: [phone]   RxID:   [id number] ANUSOL-HC 25 MG SUPP (HYDROCORTISONE ACETATE) one suppository before retiring  #10 x 2   Entered and Authorized by:   Louis Meckel MD   Signed by:   Louis Meckel MD on 03/01/2008   Method used:   Faxed to ...       [facility] (mail-order)       [address]       [place], [place]  [address]       Ph: [phone]       Fax: [phone]   RxID:   [id number]  Rx sent to [facility] Error resent rx to [facility]

## 2010-05-23 NOTE — Letter (Signed)
Summary: Results Follow-up Letter  [facility]  [address]   [place], [place] [address]   Phone: [phone]  Fax: [phone]    08/23/2007  [address] [place], [place]  [address]  Dear [name],   Your cholesterol was a bit high.  I'd like for you to make an appointment so that we can talk about diet changes and medication.  Hope you are doing well.   Sincerely,  Ruthe Mannan MD [facility]           Appended Document: Results Follow-up Letter Letter sent to pt via mail    ............................[name] [name],CMA (AAMA)

## 2010-05-23 NOTE — Assessment & Plan Note (Signed)
Summary: stomach and foot pain wp   Vital Signs:  Patient Profile:   60 Years Old Female Height:     63.5 inches (162.56 cm) Weight:      194 pounds BMI:     33.95 Temp:     98 degrees F Pulse rate:   85 / minute BP sitting:   135 / 80  Pt. in pain?   yes    Location:   legs    Intensity:   10  Vitals Entered By: Golden Circle RN (June 14, 2008 8:22 AM)                  PCP:  Ruthe Mannan  MD  Chief Complaint:   legs hurt, feet cramp, and abd pain.  History of Present Illness: 60 yo female with h/o chronic multiple complaints who presents today with complaints of stomach pain, nausea, and insomnia.  1.  Stomach pain- says her symptoms of reflux are about the same aswith worsening GERD.  She is a very poor historian but she cannot tolerate the Kapidex Dr. Arlyce Dice prescribed.   She also referred herself to Dr. Perlie Gold, OBGYN, for abdominal pain.  She has known interstitial cystitis, GERD, and hemorrhoids.  Has not been avoiding spicy foods.  Says she always nauseated but feels it is more frequent lately.  Denies any vomitting.  Was treated for vertigo in past with meclizine, but states that makes her feel funny.  Occassional sensation that room is spinning.  2.  Insomina- more life stressors, but did not want to elaborate.  Difficulty falling and staying asleep.  Medical history and medications reviewed.  Medications updated.    Updated Prior Medication List: PROVENTIL HFA 108 (90 BASE) MCG/ACT  AERS (ALBUTEROL SULFATE) 1-2 puffs q four hours as needed. AMBIEN CR 6.25 MG TBCR (ZOLPIDEM TARTRATE) Take 1 tablet by mouth at bedtime DYAZIDE 37.5-25 MG CAPS (TRIAMTERENE-HCTZ) Take 1 capsule by mouth once a day MECLIZINE HCL 12.5 MG TABS (MECLIZINE HCL) Take 1 tablet by mouth twice a day PRILOSEC OTC 20 MG TBEC (OMEPRAZOLE MAGNESIUM) 1 by mouth once daily ELMIRON 100 MG CAPS (PENTOSAN POLYSULFATE SODIUM) 1 tab by mouth three times a day. GABAPENTIN 300 MG CAPS (GABAPENTIN) 1  tab by mouth tid PROMETHAZINE HCL 12.5 MG TABS (PROMETHAZINE HCL) 1 tab by mouth q 4-6 hours as needed for nausea.  Current Allergies: No known allergies   Past Medical History:    L. arm pit abcess--I & D 4/01    GERD, hemorroids- see dr. Arlyce Dice    Interstitial cystitis    Risk Factors:     Counseled to quit/cut down tobacco use:  yes   Review of Systems      See HPI   Physical Exam  General:     Well-developed,well-nourished,in no acute distress; alert,appropriate and cooperative throughout examination Lungs:     Normal respiratory effort, chest expands symmetrically. Lungs are clear to auscultation, no crackles or wheezes. Heart:     Distant S1 and S2.  No mumurs noted Abdomen:     Bowel sounds positive,abdomen soft and non-tender without masses, organomegaly or hernias noted.    Impression & Recommendations:  Problem # 1:  GASTROESOPHAGEAL REFLUX DISEASE, CHRONIC (ICD-530.81) Assessment: Unchanged Advised to follow up with Dr. Arlyce Dice as he is attempting multiple different drug trials for her. Her updated medication list for this problem includes:    Prilosec Otc 20 Mg Tbec (Omeprazole magnesium) .Marland Kitchen... 1 by mouth once daily  Orders:  [facility]- Est Level  3 ([address])   Problem # 2:  BENIGN POSITIONAL VERTIGO (ICD-386.11) Assessment: Deteriorated She does not want to try anything for nausea other than phenergan although I tried to counsel otherwise. Her updated medication list for this problem includes:    Meclizine Hcl 12.5 Mg Tabs (Meclizine hcl) .Marland Kitchen... Take 1 tablet by mouth twice a day    Promethazine Hcl 12.5 Mg Tabs (Promethazine hcl) .Marland Kitchen... 1 tab by mouth q 4-6 hours as needed for nausea.  Orders: [facility]- Est Level  3 ([address])   Complete Medication List: 1)  Proventil Hfa 108 (90 Base) Mcg/act Aers (Albuterol sulfate) .Marland Kitchen.. 1-2 puffs q four hours as needed. 2)  Ambien Cr 6.25 Mg Tbcr (Zolpidem tartrate) .... Take 1 tablet by mouth at bedtime 3)  Dyazide 37.5-25  Mg Caps (Triamterene-hctz) .... Take 1 capsule by mouth once a day 4)  Meclizine Hcl 12.5 Mg Tabs (Meclizine hcl) .... Take 1 tablet by mouth twice a day 5)  Prilosec Otc 20 Mg Tbec (Omeprazole magnesium) .Marland Kitchen.. 1 by mouth once daily 6)  Elmiron 100 Mg Caps (Pentosan polysulfate sodium) .Marland Kitchen.. 1 tab by mouth three times a day. 7)  Gabapentin 300 Mg Caps (Gabapentin) .Marland Kitchen.. 1 tab by mouth tid 8)  Promethazine Hcl 12.5 Mg Tabs (Promethazine hcl) .Marland Kitchen.. 1 tab by mouth q 4-6 hours as needed for nausea.    Prescriptions: PROMETHAZINE HCL 12.5 MG TABS (PROMETHAZINE HCL) 1 tab by mouth q 4-6 hours as needed for nausea.  #60 x 0   Entered and Authorized by:   Ruthe Mannan MD   Signed by:   Ruthe Mannan MD on 06/14/2008   Method used:   Print then Give to Patient   RxID:   [id number] AMBIEN CR 6.25 MG TBCR (ZOLPIDEM TARTRATE) Take 1 tablet by mouth at bedtime  #7 x 0   Entered and Authorized by:   Ruthe Mannan MD   Signed by:   Ruthe Mannan MD on 06/14/2008   Method used:   Print then Give to Patient   RxID:   [id number] DYAZIDE 37.5-25 MG CAPS (TRIAMTERENE-HCTZ) Take 1 capsule by mouth once a day  #90 x 6   Entered and Authorized by:   Ruthe Mannan MD   Signed by:   Ruthe Mannan MD on 06/14/2008   Method used:   Electronically to        [id number] (retail)       [address]       [place], [place]  [address]       Ph: [phone] or [phone]       Fax: [phone]   RxID:   [id number] GABAPENTIN 300 MG CAPS (GABAPENTIN) 1 tab by mouth tid  #120 x 0   Entered and Authorized by:   Ruthe Mannan MD   Signed by:   Ruthe Mannan MD on 06/14/2008   Method used:   Electronically to        [id number] (retail)       [address]       [place], [place]  [address]       Ph: [phone] or [phone]       Fax: [phone]   RxID:   [id number] ELMIRON 100 MG CAPS (PENTOSAN POLYSULFATE  SODIUM) 1 tab by mouth three  times a day.  #30 x 0   Entered and Authorized by:   Ruthe Mannan MD   Signed by:   Ruthe Mannan MD on 06/14/2008   Method used:   Electronically to        [id number] (retail)       [address]       [place], [place]  [address]       Ph: [phone] or [phone]       Fax: [phone]   RxID:   [id number]

## 2010-05-23 NOTE — Assessment & Plan Note (Signed)
Summary: vertigo,,check bp, check boil /ls   Vital Signs:  Patient Profile:   60 Years Old Female Height:     64 inches (162.56 cm) Weight:      194 pounds (88.18 kg) BMI:     33.42 Temp:     98.6 degrees F (37.00 degrees C) Pulse rate:   93 / minute Pulse (ortho):   103 / minute BP sitting:   154 / 78  (left arm) BP standing:   133 / 80  Vitals Entered ByMarland Kitchen Tomasa Rand (October 21, 2006 9:52 AM)               Serial Vital Signs/Assessments:  Time      Position  BP       Pulse  Resp  Temp     By 10:31 AM  Lying RA  117/61   90                    GRISELDA VASQUEZ 10:31 AM  Sitting   125/79   91                    GRISELDA VASQUEZ 10:31 AM  Standing  133/80   103                   GRISELDA VASQUEZ   Chief Complaint:  vertigo/ boil to buttocks area.  History of Present Illness: States has vertigo ( feels nauseas, states has staggering gait, feels as if she is going to fall, leans to right mainly).  States feels that room is spinning.  States was on meclizine in the past.  Off this med secondary to expense off> 6 mos.  Last fall 3 weeks ago upon awakening b/c of dizziness.  Denies trauma, no loss of consciouness.  No stroke symptoms  States has hx of boils and placed on abx in past.  Has not needed any recently.  States may have boil forming in crease of rectum.  No fever.  No drainage.  Has not been on antibiotics.          Physical Exam  General:     anxious appearing Head:     Normocephalic and atraumatic without obvious abnormalities. No apparent alopecia or balding. Ears:     External ear exam shows no significant lesions or deformities.  Otoscopic examination reveals clear canals, tympanic membranes are intact bilaterally without bulging, retraction, inflammation or discharge. Hearing is grossly normal bilaterally. Nose:     External nasal examination shows no deformity or inflammation. Nasal mucosa are pink and moist without lesions or exudates. Mouth:  Oral mucosa and oropharynx without lesions or exudates.  Lungs:     Normal respiratory effort, chest expands symmetrically. Lungs are clear to auscultation, no crackles or wheezes. Heart:     Distant S1 and S2.  No mumurs noted Neurologic:     No cranial nerve deficits noted. DTRs are symmetrical throughout. Sensory, motor and coordinative functions appear intact. Additional Exam:     normal gait/ small steps limited by patient effort.  Hallpikes ellicits vertigo    Impression & Recommendations:  Problem # 1:  BENIGN POSITIONAL VERTIGO (ICD-386.11) Assessment: Deteriorated Positive hallpikes on exam.  Less likely concerned with central/ v/s vertibrobasilar etilogy based on physical exam and normal neuro exam.   Restart meclizine 12.5 mg two times a day.  Check lytes and CBC today to assure not contributing to pt symptoms.  Hand out given for [employer]  and [employer].  Consider PT referral if no improvement next visit.   Orders: [facility]- Est  Level 4 (47829)   Problem # 2:  HYPERTENSION, BENIGN ESSENTIAL (ICD-401.1) Assessment: Deteriorated Deteriorated seconday to med noncompliance.  Refilled Dyazide 25/37.5 mg daily.  Check TSH.  Needs UA to assess for proteinuria.  Baseline EKG NSR 78 bpm 09/2000.  Recheck creat today.  Needs to stop smoking to assist with BP.  Refer for Rudell Cobb for assistance to see if pt qualifies.   Orders: Comp Met-[facility] [id number]) CBC-[facility] (84696) TSH-[facility] [phone]) Urinalysis-[facility] (00000) [facility]- Est  Level 4 (40102)   Problem # 3:  PILONIDAL CYST (ICD-685.1) Assessment: Improved stable today.  No I & D needed today.   Orders: [facility]- Est  Level 4 (72536)   Problem # 4:  URINARY INCONTINENCE (ICD-788.30) Assessment: Unchanged States was on imipramine in past by urology.  Obtain records.  Do not refill until records obtained.  Encourage follow up with ([facility]) since pt has been seen there in the past.  Needs seperate visit to address with PCP.     Orders: Comp Met-[facility] [phone])    Patient Instructions: 1)  Please schedule a follow-up appointment in 1 month for vertigo and high blood pressure with your regular physician (Dr. Sandi Mealy) 2)  Restart meclizine 12.5 mg two times a day for vertigo.  Do manuevers on hand out to assist with vertigo.  This is not likely to improve if you do not do these activities. 3)  Refill given on Dyazide 25/37.5 mg daily for blood pressure. 4)  Have results sent from the urology office to [place] regarding your bladder problems.  Our fax # is [phone] 5)  Talk to ther front office staff regarding financial asst with Rudell Cobb.   6)  Stop smoking.           Laboratory Results   Urine Tests  Date/Time Recieved: October 21, 2006 10:54 AM Date/Time Reported: October 21, 2006 11:40 AM  Routine Urinalysis   Color: yellow Appearance: clear Glucose: negative   (Normal Range: Negative) Bilirubin: negative   (Normal Range: Negative) Ketone: negative   (Normal Range: Negative) Spec. Gravity: 1.025   (Normal Range: 1.003-1.035) Blood: negative   (Normal Range: Negative) pH: 5.5   (Normal Range: 5.0-8.0) Protein: negative   (Normal Range: Negative) Urobilinogen: negative   (Normal Range: 0-1) Nitrite: negative   (Normal Range: Negative) Leukocyte Esterace: negative   (Normal Range: Negative)    Comments: ...................................................................Altamese Dilling CMA,  October 21, 2006 11:41 AM

## 2010-05-23 NOTE — Consult Note (Signed)
Summary: [facility]  [facility]   Imported By: Clydell Hakim 09/26/2009 16:05:20  _____________________________________________________________________  External Attachment:    Type:   Image     Comment:   External Document

## 2010-05-23 NOTE — Progress Notes (Signed)
Summary: called pt/re: meds/ts  ---- Converted from flag ---- ---- 05/09/2010 12:23 PM, Helane Rima DO wrote: please tell patient that I changed Rx into 2 pills - 1 is lisinopril 40 and the other is HCTZ 25. It is better to take the med this way instead of together and we can discuss why at the next OV. ------------------------------  called pt and given message. pt verbalized understanding.

## 2010-05-23 NOTE — Assessment & Plan Note (Signed)
Summary: allergic reaction,df   Vital Signs:  Patient profile:   60 year old female Height:      63.5 inches Weight:      201.4 pounds BMI:     35.24 Temp:     99.3 degrees F oral Pulse rate:   100 / minute BP sitting:   127 / 84  (left arm) Cuff size:   large  Vitals Entered By: Arlyss Repress CMA, (January 04, 2010 10:28 AM) CC: hives all over body. itching and burning and pain ... pt states 'no one tries to help me. I came all the way from [place]. workman's comp put me in a hotel near [place] for the week so I can have Therapy. On the weekends I am home. I need help with my pain and situation. I can not eat since started no diabetic meds and HTN meds. I am shaking. All the meds make me sick, that's why I was put into the Hospital last month, because I was shaking and I fell in here,  they had to call the Ambulance'.  Is Patient Diabetic? Yes Pain Assessment Patient in pain? yes     Location: body Intensity: 10 Onset of pain  x2 weeks.   Primary Care Provider:  [name] MD  CC:  hives all over body. itching and burning and pain ... pt states 'no one tries to help me. I came all the way from [place]. workman's comp put me in a hotel near [place] for the week so I can have Therapy. On the weekends I am home. I need help with my pain and situation. I can not eat since started no diabetic meds and HTN meds. I am shaking. All the meds make me sick, that's why I was put into the Hospital last month, because I was shaking and I fell in here, and they had to call the Ambulance'. .  History of Present Illness: HIgh anxiety, itching, shaking, believes that she is having an allergic reaction to her diabetic meds.  She is in a comprehensive rehab program with [employer], living in [place] in a motel.  Her son is with her today, he left the room when his mother started stripping off her clothes. He did return for the summary and was very supportive.  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  She has not been able to work.  She reports being on many meds and having adverse reaction.  She also reports acute depression that she cannot shake, and seems to hang wtih her.  She does not want to work the way she worked before.    She also reports seeing a psychiatrist Otelia Santee in [facility], he has her on Prozac and did have her on clonazepam in the past that was helpful.  I called and discussed her case with Katrina [phone]) and she will return in 2 weeks.  The program is a comprehensive rehab program for workmans comp cases.  She sees a psychiatrist, physiatrist, and therapy daily.  It is a four week progman and she is struggling with keeping up.  Habits & Providers  Alcohol-Tobacco-Diet     Tobacco Status: current     Tobacco Counseling: to quit use of tobacco products  Comments: only smokes under stress  Current Medications (verified): 1)  Cetirizine Hcl 10 Mg Tabs (Cetirizine Hcl) .... One Daily  2)  Nystatin 100000 Unit/gm Powd (Nystatin) .... Apply To Lower Abdomen Two Times A Day As Needed. Disp 1 Bottle 3)  Adult Diapers .... As Needed.  Disp Enough For Three Times A Day Use For 1 Month. 4)  Onetouch Test  Strp (Glucose Blood) .... Dispense Quantity Sufficient For Twice Daily Blood Glucose Testing. 5)  Onetouch Delica Lancets  Misc (Lancets) .... Dispense Quantity Sufficient For Twice Daily Testing. 6)  Prozac 40 Mg Caps (Fluoxetine Hcl) .... One Daily 7)  Clonazepam 0.5 Mg Tabs (Clonazepam) .... One Two Times A Day 8)  Topiramate 50 Mg Tabs (Topiramate) .... One Daily  Allergies: 1)  Pristiq (Desvenlafaxine Succinate)  Review of Systems General:  Complains of weakness; denies fever and sleep disorder. GU:  Denies dysuria; urine is hot. Derm:  Complains of itching and rash.  Physical Exam  General:  Very anxious AA female, shaking she is so  upset. Lungs:  normal respiratory effort and normal breath sounds.   Heart:  normal rate and regular rhythm.   Skin:  no rashes, did have outbreak on her face that appeared more like blocked pores. Psych:  not suicidal, severely anxious, easily distracted, poor concentration, and agitated.     Impression & Recommendations:  Problem # 1:  DEPRESSION, MAJOR, WITH PSYCHOTIC BEHAVIOR (ICD-298.0) Severe mental illness at this point, do not beleive she can benefit from comprehensive work Web [employer].  Discussed this with manager of her program.  Added benzo back in conjunction with SSRI she will need to contiue to see psychiatry. Orders: [facility]- Est  Level 4 (16109)  Problem # 2:  DIABETES MELLITUS, TYPE II (ICD-250.00) Will take her off her meds for now, and resume different meds in 2 weeks.  She believes that this is causing her itching. The following medications were removed from the medication list:    Metformin Hcl 1000 Mg Tabs (Metformin hcl) .Marland Kitchen... 1/2 tablet (500mg )  by mouth daily for diabetes    Glyburide 5 Mg Tabs (Glyburide) .Marland Kitchen... Take one tablet once a day.  Orders: [facility]- Est  Level 4 (60454)  Problem # 3:  HYPERTENSION, BENIGN ESSENTIAL (ICD-401.1) Discontinued for now, she also had a thiazide dieuretic in her pill box (identified by pharm) that was not on her med list, worry about sulfa allergy as well.  Will check BP at 2 week follow up visit and add amlodapine if needed. The following medications were removed from the medication list:    Furosemide 20 Mg Tabs (Furosemide) .Marland Kitchen... 1 by mouth qam for excess fluid  Problem # 4:  CHRONIC PAIN SYNDROME (ICD-338.4) This was not her complaint today, she was taking Topamax 50 mg two times a day, decreased to at bedtime.  Complete Medication List: 1)  Cetirizine Hcl 10 Mg Tabs (Cetirizine hcl) .... One daily 2)  Nystatin 100000 Unit/gm Powd (Nystatin) .... Apply to lower abdomen two times a day as needed. disp 1 bottle 3)  Adult  Diapers  .... As needed.  disp enough for three times a day use for 1 month. 4)  Onetouch Test Strp (Glucose blood) .... Dispense quantity sufficient for twice daily blood glucose testing. 5)  Onetouch Delica Lancets Misc (Lancets) .... Dispense quantity sufficient for twice daily testing. 6)  Prozac 40 Mg Caps (Fluoxetine hcl) .... One daily 7)  Clonazepam 0.5 Mg Tabs (Clonazepam) .... One two times a day 8)  Topiramate 50 Mg Tabs (Topiramate) .... One daily  Patient Instructions: 1)  return in 2 weeks with  [facility] 2)  Dove soap/arm and hammer detergent 3)  Only take the meds on this list Prescriptions: CETIRIZINE HCL 10 MG TABS (CETIRIZINE HCL) one daily Brand medically necessary #30 x 3   Entered and Authorized by:   Luretha Murphy NP   Signed by:   Luretha Murphy NP on 01/04/2010   Method used:   Print then Give to Patient   RxID:   [id number] CLONAZEPAM 0.5 MG TABS (CLONAZEPAM) one two times a day  #60 x 0   Entered and Authorized by:   Luretha Murphy NP   Signed by:   Luretha Murphy NP on 01/04/2010   Method used:   Print then Give to Patient   RxID:   [id number]

## 2010-05-23 NOTE — Consult Note (Signed)
Summary: Therapy Session  Therapy Session   Imported By: De Nurse 09/07/2009 16:04:38  _____________________________________________________________________  External Attachment:    Type:   Image     Comment:   External Document

## 2010-05-23 NOTE — Progress Notes (Signed)
Summary: PCP change request  Phone Note Call from Patient Call back at Home Phone [phone]   Summary of Call: pt is asking to switch providers, she wants Dr. Jeanice Lim to follow her since she has already been seeing her. Advised Chiropodist would have to approve request. Initial call taken by: Knox Royalty,  January 28, 2010 1:42 PM  Follow-up for Phone Call        I think that she means Dr. Earlene Plater. It doesn't look like Dr. Jeanice Lim has seen her in the past. If she does mean me, I will okay the change. If she really does prefer Dr. Jeanice Lim, that is okay with me as well. Follow-up by: Helane Rima DO,  January 29, 2010 10:06 AM  Additional Follow-up for Phone Call Additional follow up Details #1::        Pt meant Dr. Earlene Plater.  I did switch her to Dr. Earlene Plater. Additional Follow-up by: Dennison Nancy RN,  February 04, 2010 3:27 PM

## 2010-05-24 NOTE — Consult Note (Signed)
Summary: [facility] Home Visit  [facility] Visit   Imported By: Clydell Hakim 12/12/2009 12:02:46  _____________________________________________________________________  External Attachment:    Type:   Image     Comment:   External Document

## 2010-06-07 ENCOUNTER — Ambulatory Visit (INDEPENDENT_AMBULATORY_CARE_PROVIDER_SITE_OTHER): Payer: Commercial Managed Care - PPO | Admitting: Pharmacist

## 2010-06-07 DIAGNOSIS — K219 Gastro-esophageal reflux disease without esophagitis: Secondary | ICD-10-CM

## 2010-06-07 DIAGNOSIS — F17200 Nicotine dependence, unspecified, uncomplicated: Secondary | ICD-10-CM

## 2010-06-07 DIAGNOSIS — E119 Type 2 diabetes mellitus without complications: Secondary | ICD-10-CM

## 2010-06-07 NOTE — Patient Instructions (Signed)
1)  Follow up with Dr. Earlene Plater next week.   Make appointment on the way out.  2)  No changes in your medication today.  3)  Continue to check you blood sugars and bring your log back to next visit.   Please check if you have symptoms of feeling cold OR if your vision changes.  4)  Try some antacids and see if they help with your reflux.  5)  Are you ready to quit smoking?

## 2010-06-07 NOTE — Assessment & Plan Note (Signed)
Subjectively poor control of reflux despite PPI use.   Patient complains of reflux, choking symptoms, cough and nocturnal awakenings.  She states she feels short of breat at times and has recently started waking at night.  She is concerned that she has a recurrence of asthma which she had been diagnosed with years ago.   She would like to have a new albuterol inhaler.   However, her PEF of 320 was near normal for her age.   She was encouraged to follow up with Dr. Earlene Plater for more evaluation and work up of her Reflux at follow up in the next few weeks.

## 2010-06-07 NOTE — Assessment & Plan Note (Signed)
Longstanding Tobacco abuse.  Currently smoking 2-3 cigarettes but only a few days per week.   She denies smoking 2-3 days per week.  Triggers include stress and nausea.  She was encourage to consider complete cessation.  She believes she will need support with quit attempt.  Patient encouraged to let us know when she is ready to work on tobacco cessation.

## 2010-06-07 NOTE — Progress Notes (Signed)
  Subjective:    Patient ID: [name] [name], female    DOB: Dec 12, 1950, 60 y.o.   MRN: [id number]  HPI  Patient arrives with all medications and complains of nausea, and reflux symptoms.   She is concerned that she is not eating enough due to her symptoms.   Patient is still able to drink water.  She denies hypoglycemia with use of glipizide.   Review of Systems     Objective:   Physical Exam   She does NOT appear to be dehydrated.   Home CBG readings:  160-230 with majority of readings < 200.      Assessment & Plan:

## 2010-06-07 NOTE — Assessment & Plan Note (Signed)
Patient with diabetes for < 10 years who has improved control over the last few weeks based on home blood glucose readings.   Her readings appear to be at goal with only occasional readings  200.   She denies low CBG readings however she says she thinks she is having low readings b/c she is cold at times.    She was asked to record CBGs during episodes of feeling "cold".   No change in current regiment Glipizide XL 5 mg daily.   TTFFC: 35 minutes.   Likely needs to be considered for meal time insulin if A1C is > 8 at next assessment.   Send back to Rx clinic at that time if appropriate.

## 2010-06-12 ENCOUNTER — Ambulatory Visit: Payer: Commercial Managed Care - PPO | Admitting: Family Medicine

## 2010-06-13 NOTE — Progress Notes (Signed)
  Subjective:    Patient ID: [name] [name], female    DOB: 25-Apr-1950, 60 y.o.   MRN: [id number]  HPI  Reviewed and agree  Review of Systems     Objective:   Physical Exam        Assessment & Plan:

## 2010-06-17 ENCOUNTER — Ambulatory Visit: Payer: PRIVATE HEALTH INSURANCE | Admitting: Family Medicine

## 2010-06-18 ENCOUNTER — Other Ambulatory Visit: Payer: Self-pay | Admitting: Family Medicine

## 2010-06-18 NOTE — Telephone Encounter (Signed)
Refill request

## 2010-06-28 ENCOUNTER — Ambulatory Visit (INDEPENDENT_AMBULATORY_CARE_PROVIDER_SITE_OTHER): Payer: Commercial Managed Care - PPO | Admitting: Family Medicine

## 2010-06-28 DIAGNOSIS — J309 Allergic rhinitis, unspecified: Secondary | ICD-10-CM

## 2010-06-28 MED ORDER — ALBUTEROL SULFATE HFA 108 (90 BASE) MCG/ACT IN AERS: 2.0000 | INHALATION_SPRAY | Freq: Four times a day (QID) | RESPIRATORY_TRACT | Status: DC | PRN

## 2010-06-28 MED ORDER — ONDANSETRON HCL 4 MG PO TABS: 4.0000 mg | ORAL_TABLET | Freq: Three times a day (TID) | ORAL | Status: DC | PRN

## 2010-06-28 MED ORDER — FLUTICASONE PROPIONATE 50 MCG/ACT NA SUSP: 1.0000 | Freq: Every day | NASAL | Status: DC

## 2010-06-28 NOTE — Patient Instructions (Signed)
I added Flonase for your allergies.  I refilled your Zofran and Albuterol.

## 2010-07-01 LAB — URINE CULTURE
Colony Count: NO GROWTH
Culture  Setup Time: 201112082027
Culture: NO GROWTH

## 2010-07-01 LAB — LIPID PANEL
Cholesterol: 165 mg/dL (ref 0–200)
HDL: 31 mg/dL — ABNORMAL LOW (ref >39)
LDL Cholesterol: 95 mg/dL (ref 0–99)
Total CHOL/HDL Ratio: 5.3 RATIO
Triglycerides: 193 mg/dL — ABNORMAL HIGH (ref <150)
VLDL: 39 mg/dL (ref 0–40)

## 2010-07-01 LAB — CBC
HCT: 32.9 % — ABNORMAL LOW (ref 36.0–46.0)
HCT: 33 % — ABNORMAL LOW (ref 36.0–46.0)
HCT: 33.1 % — ABNORMAL LOW (ref 36.0–46.0)
HCT: 35.5 % — ABNORMAL LOW (ref 36.0–46.0)
HCT: 35.7 % — ABNORMAL LOW (ref 36.0–46.0)
HCT: 42.5 % (ref 36.0–46.0)
Hemoglobin: 10.6 g/dL — ABNORMAL LOW (ref 12.0–15.0)
Hemoglobin: 10.8 g/dL — ABNORMAL LOW (ref 12.0–15.0)
Hemoglobin: 10.9 g/dL — ABNORMAL LOW (ref 12.0–15.0)
Hemoglobin: 11.6 g/dL — ABNORMAL LOW (ref 12.0–15.0)
Hemoglobin: 11.7 g/dL — ABNORMAL LOW (ref 12.0–15.0)
Hemoglobin: 14.5 g/dL (ref 12.0–15.0)
MCH: 27.5 pg (ref 26.0–34.0)
MCH: 28.1 pg (ref 26.0–34.0)
MCH: 28.1 pg (ref 26.0–34.0)
MCH: 28.3 pg (ref 26.0–34.0)
MCH: 28.4 pg (ref 26.0–34.0)
MCH: 28.5 pg (ref 26.0–34.0)
MCHC: 32 g/dL (ref 30.0–36.0)
MCHC: 32.5 g/dL (ref 30.0–36.0)
MCHC: 32.7 g/dL (ref 30.0–36.0)
MCHC: 33 g/dL (ref 30.0–36.0)
MCHC: 33.1 g/dL (ref 30.0–36.0)
MCHC: 34.1 g/dL (ref 30.0–36.0)
MCV: 83.7 fL (ref 78.0–100.0)
MCV: 85.1 fL (ref 78.0–100.0)
MCV: 85.5 fL (ref 78.0–100.0)
MCV: 85.8 fL (ref 78.0–100.0)
MCV: 86.4 fL (ref 78.0–100.0)
MCV: 86.8 fL (ref 78.0–100.0)
Platelets: 131 10*3/uL — ABNORMAL LOW (ref 150–400)
Platelets: 132 10*3/uL — ABNORMAL LOW (ref 150–400)
Platelets: 144 10*3/uL — ABNORMAL LOW (ref 150–400)
Platelets: 145 10*3/uL — ABNORMAL LOW (ref 150–400)
Platelets: 154 10*3/uL (ref 150–400)
Platelets: 174 10*3/uL (ref 150–400)
RBC: 3.8 MIL/uL — ABNORMAL LOW (ref 3.87–5.11)
RBC: 3.85 MIL/uL — ABNORMAL LOW (ref 3.87–5.11)
RBC: 3.86 MIL/uL — ABNORMAL LOW (ref 3.87–5.11)
RBC: 4.13 MIL/uL (ref 3.87–5.11)
RBC: 4.17 MIL/uL (ref 3.87–5.11)
RBC: 5.08 MIL/uL (ref 3.87–5.11)
RDW: 12.9 % (ref 11.5–15.5)
RDW: 13.2 % (ref 11.5–15.5)
RDW: 13.2 % (ref 11.5–15.5)
RDW: 13.5 % (ref 11.5–15.5)
RDW: 13.7 % (ref 11.5–15.5)
RDW: 13.7 % (ref 11.5–15.5)
WBC: 3.9 10*3/uL — ABNORMAL LOW (ref 4.0–10.5)
WBC: 5.3 10*3/uL (ref 4.0–10.5)
WBC: 5.5 10*3/uL (ref 4.0–10.5)
WBC: 6.6 10*3/uL (ref 4.0–10.5)
WBC: 8.1 10*3/uL (ref 4.0–10.5)
WBC: 8.6 10*3/uL (ref 4.0–10.5)

## 2010-07-01 LAB — BASIC METABOLIC PANEL
BUN: 18 mg/dL (ref 6–23)
BUN: 3 mg/dL — ABNORMAL LOW (ref 6–23)
CO2: 24 mEq/L (ref 19–32)
CO2: 29 mEq/L (ref 19–32)
Calcium: 8.2 mg/dL — ABNORMAL LOW (ref 8.4–10.5)
Calcium: 8.7 mg/dL (ref 8.4–10.5)
Chloride: 107 mEq/L (ref 96–112)
Chloride: 99 mEq/L (ref 96–112)
Creatinine, Ser: 0.81 mg/dL (ref 0.4–1.2)
Creatinine, Ser: 2.27 mg/dL — ABNORMAL HIGH (ref 0.4–1.2)
GFR calc Af Amer: 26 mL/min — ABNORMAL LOW (ref >60)
GFR calc Af Amer: >60 mL/min (ref >60)
GFR calc non Af Amer: 22 mL/min — ABNORMAL LOW (ref >60)
GFR calc non Af Amer: >60 mL/min (ref >60)
Glucose, Bld: 144 mg/dL — ABNORMAL HIGH (ref 70–99)
Glucose, Bld: 218 mg/dL — ABNORMAL HIGH (ref 70–99)
Potassium: 3.8 mEq/L (ref 3.5–5.1)
Potassium: 4.4 mEq/L (ref 3.5–5.1)
Sodium: 130 mEq/L — ABNORMAL LOW (ref 135–145)
Sodium: 140 mEq/L (ref 135–145)

## 2010-07-01 LAB — COMPREHENSIVE METABOLIC PANEL
ALT: 22 U/L (ref 0–35)
ALT: 24 U/L (ref 0–35)
ALT: 24 U/L (ref 0–35)
AST: 22 U/L (ref 0–37)
AST: 29 U/L (ref 0–37)
AST: 34 U/L (ref 0–37)
Albumin: 3.1 g/dL — ABNORMAL LOW (ref 3.5–5.2)
Albumin: 3.2 g/dL — ABNORMAL LOW (ref 3.5–5.2)
Albumin: 4.4 g/dL (ref 3.5–5.2)
Alkaline Phosphatase: 63 U/L (ref 39–117)
Alkaline Phosphatase: 71 U/L (ref 39–117)
Alkaline Phosphatase: 94 U/L (ref 39–117)
BUN: 13 mg/dL (ref 6–23)
BUN: 3 mg/dL — ABNORMAL LOW (ref 6–23)
BUN: 9 mg/dL (ref 6–23)
CO2: 23 mEq/L (ref 19–32)
CO2: 26 mEq/L (ref 19–32)
CO2: 27 mEq/L (ref 19–32)
Calcium: 8.1 mg/dL — ABNORMAL LOW (ref 8.4–10.5)
Calcium: 8.3 mg/dL — ABNORMAL LOW (ref 8.4–10.5)
Calcium: 9.5 mg/dL (ref 8.4–10.5)
Chloride: 100 mEq/L (ref 96–112)
Chloride: 103 mEq/L (ref 96–112)
Chloride: 108 mEq/L (ref 96–112)
Creatinine, Ser: 0.81 mg/dL (ref 0.4–1.2)
Creatinine, Ser: 1.02 mg/dL (ref 0.4–1.2)
Creatinine, Ser: 1.31 mg/dL — ABNORMAL HIGH (ref 0.4–1.2)
GFR calc Af Amer: 50 mL/min — ABNORMAL LOW (ref >60)
GFR calc Af Amer: >60 mL/min (ref >60)
GFR calc Af Amer: >60 mL/min (ref >60)
GFR calc non Af Amer: 41 mL/min — ABNORMAL LOW (ref >60)
GFR calc non Af Amer: 55 mL/min — ABNORMAL LOW (ref >60)
GFR calc non Af Amer: >60 mL/min (ref >60)
Glucose, Bld: 161 mg/dL — ABNORMAL HIGH (ref 70–99)
Glucose, Bld: 206 mg/dL — ABNORMAL HIGH (ref 70–99)
Glucose, Bld: 232 mg/dL — ABNORMAL HIGH (ref 70–99)
Potassium: 3.9 mEq/L (ref 3.5–5.1)
Potassium: 4.1 mEq/L (ref 3.5–5.1)
Potassium: 4.9 mEq/L (ref 3.5–5.1)
Sodium: 134 mEq/L — ABNORMAL LOW (ref 135–145)
Sodium: 139 mEq/L (ref 135–145)
Sodium: 139 mEq/L (ref 135–145)
Total Bilirubin: 0.4 mg/dL (ref 0.3–1.2)
Total Bilirubin: 0.4 mg/dL (ref 0.3–1.2)
Total Bilirubin: 0.5 mg/dL (ref 0.3–1.2)
Total Protein: 6.1 g/dL (ref 6.0–8.3)
Total Protein: 6.2 g/dL (ref 6.0–8.3)
Total Protein: 8.3 g/dL (ref 6.0–8.3)

## 2010-07-01 LAB — GLUCOSE, CAPILLARY
Glucose-Capillary: 109 mg/dL — ABNORMAL HIGH (ref 70–99)
Glucose-Capillary: 112 mg/dL — ABNORMAL HIGH (ref 70–99)
Glucose-Capillary: 128 mg/dL — ABNORMAL HIGH (ref 70–99)
Glucose-Capillary: 132 mg/dL — ABNORMAL HIGH (ref 70–99)
Glucose-Capillary: 133 mg/dL — ABNORMAL HIGH (ref 70–99)
Glucose-Capillary: 135 mg/dL — ABNORMAL HIGH (ref 70–99)
Glucose-Capillary: 154 mg/dL — ABNORMAL HIGH (ref 70–99)
Glucose-Capillary: 160 mg/dL — ABNORMAL HIGH (ref 70–99)
Glucose-Capillary: 169 mg/dL — ABNORMAL HIGH (ref 70–99)
Glucose-Capillary: 172 mg/dL — ABNORMAL HIGH (ref 70–99)
Glucose-Capillary: 173 mg/dL — ABNORMAL HIGH (ref 70–99)
Glucose-Capillary: 174 mg/dL — ABNORMAL HIGH (ref 70–99)
Glucose-Capillary: 179 mg/dL — ABNORMAL HIGH (ref 70–99)
Glucose-Capillary: 184 mg/dL — ABNORMAL HIGH (ref 70–99)
Glucose-Capillary: 187 mg/dL — ABNORMAL HIGH (ref 70–99)
Glucose-Capillary: 197 mg/dL — ABNORMAL HIGH (ref 70–99)
Glucose-Capillary: 208 mg/dL — ABNORMAL HIGH (ref 70–99)
Glucose-Capillary: 211 mg/dL — ABNORMAL HIGH (ref 70–99)
Glucose-Capillary: 226 mg/dL — ABNORMAL HIGH (ref 70–99)

## 2010-07-01 LAB — DIFFERENTIAL
Basophils Absolute: 0 10*3/uL (ref 0.0–0.1)
Basophils Relative: 0 % (ref 0–1)
Eosinophils Absolute: 0 10*3/uL (ref 0.0–0.7)
Eosinophils Relative: 0 % (ref 0–5)
Lymphocytes Relative: 31 % (ref 12–46)
Lymphs Abs: 2.5 10*3/uL (ref 0.7–4.0)
Monocytes Absolute: 0.4 10*3/uL (ref 0.1–1.0)
Monocytes Relative: 5 % (ref 3–12)
Neutro Abs: 5.1 10*3/uL (ref 1.7–7.7)
Neutrophils Relative %: 63 % (ref 43–77)

## 2010-07-01 LAB — LIPASE, BLOOD
Lipase: 132 U/L — ABNORMAL HIGH (ref 11–59)
Lipase: 40 U/L (ref 11–59)

## 2010-07-01 LAB — MRSA PCR SCREENING: MRSA by PCR: NEGATIVE

## 2010-07-01 LAB — URINALYSIS, ROUTINE W REFLEX MICROSCOPIC
Bilirubin Urine: NEGATIVE
Glucose, UA: NEGATIVE mg/dL
Hgb urine dipstick: NEGATIVE
Ketones, ur: NEGATIVE mg/dL
Nitrite: NEGATIVE
Protein, ur: NEGATIVE mg/dL
Specific Gravity, Urine: 1.01 (ref 1.005–1.030)
Urobilinogen, UA: 0.2 mg/dL (ref 0.0–1.0)
pH: 5.5 (ref 5.0–8.0)

## 2010-07-01 LAB — LACTATE DEHYDROGENASE: LDH: 128 U/L (ref 94–250)

## 2010-07-01 LAB — C-REACTIVE PROTEIN: CRP: 2 mg/dL — ABNORMAL HIGH (ref <0.6)

## 2010-07-07 ENCOUNTER — Encounter: Payer: Self-pay | Admitting: Family Medicine

## 2010-07-07 NOTE — Assessment & Plan Note (Signed)
Advised patient to take Zyrtec consistently. Rx Flonase. Rx Albuterol for prn wheeze, but explained to patient that she does NOT need the inhaler at this time.

## 2010-07-07 NOTE — Progress Notes (Signed)
  Subjective:    Patient ID: [name] [name], female    DOB: 1951/02/24, 60 y.o.   MRN: [id number]  HPI NOTE: Patient late to visit, so limited time for complaints. She has several, as usual, but we limited them to the 2 that are most bothersome today.  1. Allergies/Asthma: Worse over past few weeks. Taking Zyrtec but not regularly. Endorses nasal congestion and rhinitis, sinus pressure, wheeze, PND. Wants refill of albuterol. Reviewed Pharmacy Clinic OV - NORMAL PFTs.  2. LE Edema: Bilateral. No CP, SOB. Rx HCTZ.  Review of Systems See HPI.    Objective:   Physical Exam  [vitalsreviewed. Constitutional: She appears well-developed and well-nourished. No distress.  HENT:  Right Ear: Tympanic membrane normal.  Left Ear: Tympanic membrane normal.  Nose: Mucosal edema and rhinorrhea present.  Mouth/Throat: Oropharynx is clear and moist and mucous membranes are normal.  Eyes: Conjunctivae are normal.  Cardiovascular: Normal rate, regular rhythm and normal heart sounds.   Pulmonary/Chest: Effort normal and breath sounds normal. She has no wheezes.  Musculoskeletal:       Right lower leg: She exhibits no edema.       Left lower leg: She exhibits no edema.  Lymphadenopathy:    She has no cervical adenopathy.  Skin: Skin is warm and dry.      Assessment & Plan:

## 2010-07-09 LAB — DIFFERENTIAL
Basophils Absolute: 0 10*3/uL (ref 0.0–0.1)
Basophils Relative: 0 % (ref 0–1)
Eosinophils Absolute: 0.1 10*3/uL (ref 0.0–0.7)
Eosinophils Relative: 1 % (ref 0–5)
Lymphocytes Relative: 29 % (ref 12–46)
Lymphs Abs: 2.7 10*3/uL (ref 0.7–4.0)
Monocytes Absolute: 0.4 10*3/uL (ref 0.1–1.0)
Monocytes Relative: 4 % (ref 3–12)
Neutro Abs: 6.1 10*3/uL (ref 1.7–7.7)
Neutrophils Relative %: 66 % (ref 43–77)

## 2010-07-09 LAB — TSH: TSH: 1.486 u[IU]/mL (ref 0.350–4.500)

## 2010-07-09 LAB — TRICYCLIC ANTIDEPRESSANT EVALUATION
Amitriptyline and Nortriptyline: <5 mcg/L — ABNORMAL LOW (ref 120–250)
Amitriptyline: <5 mcg/L
Clomipramine.: <5 ng/mL
Desemethylclomipramine: <5 ng/mL
Desipramine DPRAM: <5 mcg/L
Desmethyldoxepin: <5 mcg/L
Doxepin: <5 mcg/L
Imipramine IPRAM: <5 mcg/L
Imipramine and Desipramine: <5 mcg/L — ABNORMAL LOW (ref 150–300)
Nortriptyline NTRIP: <5 mcg/L
Tot Clomipramine+Desmethylclomipramine: NOT DETECTED not reported
Total Desmethyldoxepine+Doxepin: <5 mcg/L — ABNORMAL LOW (ref 100–250)

## 2010-07-09 LAB — RAPID URINE DRUG SCREEN, HOSP PERFORMED
Amphetamines: NOT DETECTED
Barbiturates: POSITIVE — AB
Benzodiazepines: NOT DETECTED
Cocaine: NOT DETECTED
Opiates: NOT DETECTED
Tetrahydrocannabinol: NOT DETECTED

## 2010-07-09 LAB — COMPREHENSIVE METABOLIC PANEL
ALT: 29 U/L (ref 0–35)
AST: 23 U/L (ref 0–37)
Albumin: 3.8 g/dL (ref 3.5–5.2)
Alkaline Phosphatase: 60 U/L (ref 39–117)
BUN: 14 mg/dL (ref 6–23)
CO2: 27 mEq/L (ref 19–32)
Calcium: 9.1 mg/dL (ref 8.4–10.5)
Chloride: 100 mEq/L (ref 96–112)
Creatinine, Ser: 0.7 mg/dL (ref 0.4–1.2)
GFR calc Af Amer: >60 mL/min (ref >60)
GFR calc non Af Amer: >60 mL/min (ref >60)
Glucose, Bld: 177 mg/dL — ABNORMAL HIGH (ref 70–99)
Potassium: 3.9 mEq/L (ref 3.5–5.1)
Sodium: 134 mEq/L — ABNORMAL LOW (ref 135–145)
Total Bilirubin: 0.5 mg/dL (ref 0.3–1.2)
Total Protein: 7.3 g/dL (ref 6.0–8.3)

## 2010-07-09 LAB — CBC
HCT: 37.9 % (ref 36.0–46.0)
Hemoglobin: 12.9 g/dL (ref 12.0–15.0)
MCHC: 34 g/dL (ref 30.0–36.0)
MCV: 90 fL (ref 78.0–100.0)
Platelets: 215 10*3/uL (ref 150–400)
RBC: 4.21 MIL/uL (ref 3.87–5.11)
RDW: 13.9 % (ref 11.5–15.5)
WBC: 9.3 10*3/uL (ref 4.0–10.5)

## 2010-07-09 LAB — ACETAMINOPHEN LEVEL: Acetaminophen (Tylenol), Serum: <10 ug/mL — ABNORMAL LOW (ref 10–30)

## 2010-07-09 LAB — SALICYLATE LEVEL: Salicylate Lvl: <4 mg/dL (ref 2.8–20.0)

## 2010-07-09 LAB — ETHANOL: Alcohol, Ethyl (B): <5 mg/dL (ref 0–10)

## 2010-07-15 ENCOUNTER — Ambulatory Visit (INDEPENDENT_AMBULATORY_CARE_PROVIDER_SITE_OTHER): Payer: Commercial Managed Care - PPO | Admitting: Family Medicine

## 2010-07-15 ENCOUNTER — Other Ambulatory Visit (HOSPITAL_COMMUNITY): Payer: Commercial Managed Care - PPO

## 2010-07-15 ENCOUNTER — Ambulatory Visit (HOSPITAL_COMMUNITY)
Admission: RE | Admit: 2010-07-15 | Discharge: 2010-07-15 | Disposition: A | Discharge status: Home / Self Care | Payer: Commercial Managed Care - PPO | Source: Ambulatory Visit | Attending: Family Medicine | Admitting: Family Medicine

## 2010-07-15 ENCOUNTER — Encounter: Payer: Self-pay | Admitting: Family Medicine

## 2010-07-15 DIAGNOSIS — R9431 Abnormal electrocardiogram [ECG] [EKG]: Secondary | ICD-10-CM | POA: Insufficient documentation

## 2010-07-15 DIAGNOSIS — G894 Chronic pain syndrome: Secondary | ICD-10-CM

## 2010-07-15 DIAGNOSIS — I252 Old myocardial infarction: Secondary | ICD-10-CM | POA: Insufficient documentation

## 2010-07-15 DIAGNOSIS — R079 Chest pain, unspecified: Secondary | ICD-10-CM

## 2010-07-15 DIAGNOSIS — R109 Unspecified abdominal pain: Secondary | ICD-10-CM | POA: Insufficient documentation

## 2010-07-15 LAB — POCT URINALYSIS DIPSTICK
Bilirubin, UA: NEGATIVE
Blood, UA: NEGATIVE
Glucose, UA: NEGATIVE
Ketones, UA: NEGATIVE
Leukocytes, UA: NEGATIVE
Nitrite, UA: NEGATIVE
Protein, UA: NEGATIVE
Spec Grav, UA: 1.015
Urobilinogen, UA: 0.2
pH, UA: 6

## 2010-07-15 LAB — POCT GLYCOSYLATED HEMOGLOBIN (HGB A1C): Hemoglobin A1C: 7.1

## 2010-07-15 MED ORDER — NORTRIPTYLINE HCL 25 MG PO CAPS: 25.0000 mg | ORAL_CAPSULE | Freq: Every day | ORAL | Status: DC

## 2010-07-15 NOTE — Progress Notes (Signed)
  Subjective:    Patient ID: [name] [name], female    DOB: 1950-11-29, 60 y.o.   MRN: [id number]  HPI  1. Abdominal Pain: Chronic. Hx gastritis and possible mild pancreatitis (see hospital admission 12/11). Pain concentrated in RUQ and LUQ. No identifying factors that make it better or worse. She has chronic nausea, endorses one episode of vomiting - NBNB. She has no appetite and is not eating regularly. No Diarrhea. Chronic constipation and she has not been taking prescribed Colace or Miralax. EGD in 12/11 - WNL. Patient DM, states that she thinks that the LUQ pain is her heart - "I feel like it is going to burst." She says that same about her epigastric region - "Sometimes, it gets really big and I think that it is going to pop." No CP, SOB.  2. Eye: Patient has been followed by Dr. Dione Booze, Dr. Noel Gerold (cornea), and Dr. Luciana Axe (retina). She states that she is on multiple eye drops and may be having surgery soon. She states that Dr. Noel Gerold thinks that she may have had a stroke in the past and that she needs brain imaging. She states that they told her that they would send me a letter recommending it. I have NOT received anything from them. Lanier Clam - # [id number]. She denies focal neurological deficits, speech problems, LOC.  Review of Systems SEE HPI.    Objective:   Physical Exam  Constitutional: Vital signs are normal. She appears well-developed and well-nourished. No distress.  Cardiovascular: Normal rate, regular rhythm, normal heart sounds and normal pulses.   Pulmonary/Chest: Effort normal and breath sounds normal.  Abdominal: Soft. Normal appearance and bowel sounds are normal. There is no hepatosplenomegaly. There is generalized tenderness. There is no rigidity, no rebound, no guarding, no CVA tenderness and negative Murphy's sign.  Neurological: She is alert.  Psychiatric: Her mood appears anxious.      Assessment & Plan:

## 2010-07-15 NOTE — Patient Instructions (Signed)
We are checking labs and an EKG today.  I will contact Dr. Laruth Bouchard office about the possible brain imaging.

## 2010-07-16 ENCOUNTER — Encounter: Payer: Self-pay | Admitting: Family Medicine

## 2010-07-16 LAB — COMPREHENSIVE METABOLIC PANEL
ALT: 13 U/L (ref 0–35)
AST: 17 U/L (ref 0–37)
Albumin: 4.2 g/dL (ref 3.5–5.2)
Alkaline Phosphatase: 83 U/L (ref 39–117)
BUN: 12 mg/dL (ref 6–23)
CO2: 26 mEq/L (ref 19–32)
Calcium: 9.1 mg/dL (ref 8.4–10.5)
Chloride: 104 mEq/L (ref 96–112)
Creat: 0.73 mg/dL (ref 0.40–1.20)
Glucose, Bld: 168 mg/dL — ABNORMAL HIGH (ref 70–99)
Potassium: 4.1 mEq/L (ref 3.5–5.3)
Sodium: 140 mEq/L (ref 135–145)
Total Bilirubin: 0.3 mg/dL (ref 0.3–1.2)
Total Protein: 7.7 g/dL (ref 6.0–8.3)

## 2010-07-16 LAB — CBC WITH DIFFERENTIAL/PLATELET
Basophils Absolute: 0 10*3/uL (ref 0.0–0.1)
Basophils Relative: 1 % (ref 0–1)
Eosinophils Absolute: 0.1 10*3/uL (ref 0.0–0.7)
Eosinophils Relative: 1 % (ref 0–5)
HCT: 40.5 % (ref 36.0–46.0)
Hemoglobin: 12.4 g/dL (ref 12.0–15.0)
Lymphocytes Relative: 40 % (ref 12–46)
Lymphs Abs: 2.5 10*3/uL (ref 0.7–4.0)
MCH: 28.3 pg (ref 26.0–34.0)
MCHC: 30.6 g/dL (ref 30.0–36.0)
MCV: 92.5 fL (ref 78.0–100.0)
Monocytes Absolute: 0.5 10*3/uL (ref 0.1–1.0)
Monocytes Relative: 8 % (ref 3–12)
Neutro Abs: 3.2 10*3/uL (ref 1.7–7.7)
Neutrophils Relative %: 50 % (ref 43–77)
Platelets: 189 10*3/uL (ref 150–400)
RBC: 4.38 MIL/uL (ref 3.87–5.11)
RDW: 14.2 % (ref 11.5–15.5)
WBC: 6.3 10*3/uL (ref 4.0–10.5)

## 2010-07-16 LAB — LIPASE: Lipase: 64 U/L (ref 0–75)

## 2010-07-16 NOTE — Assessment & Plan Note (Signed)
No red flags on exam. NO ACUTE ABDOMEN. Will obtain labs to look for abnormalities. As patient is DM, and abdominal pain (in her case left-sided) may indicate CP equivalent, EKG obtained - NO CHANGE. Rec restarting Miralax as constipation may be an issue. NOTE: Patient also TTP along left lower ribs. She is TTP everywhere, so I am not sure if this is another manifestation of her chronic pain syndrome.

## 2010-07-19 ENCOUNTER — Encounter: Payer: Self-pay | Admitting: Family Medicine

## 2010-07-25 ENCOUNTER — Telehealth: Payer: Self-pay | Admitting: *Deleted

## 2010-07-25 NOTE — Telephone Encounter (Signed)
Message copied by Tessie Fass on Thu Jul 25, 2010 11:42 AM ------      Message from: Helane Rima      Created: Mon Jul 15, 2010  9:02 AM       Please contact Dr. Laruth Bouchard office at [phone]. Patient states that Dr. Noel Gerold said that she may have had a stroke in the past and that it is affecting the vision in her eye. Patient states that they were going to send a letter recommending brain imaging. This doesn't make sense and I haven't received anything from them. Request letter sent if it exists.

## 2010-07-25 NOTE — Telephone Encounter (Signed)
Called to request letter. Was told I would get a call back this afternoon to let me know if they could find a copy of the letter, if so I will have a copy faxed over.Shelle Galdamez, [name]

## 2010-07-28 NOTE — Telephone Encounter (Signed)
This was already and faxed and completed. Thanks!

## 2010-08-13 ENCOUNTER — Ambulatory Visit: Payer: Commercial Managed Care - PPO | Admitting: Family Medicine

## 2010-08-20 ENCOUNTER — Other Ambulatory Visit: Payer: Self-pay | Admitting: Family Medicine

## 2010-08-20 NOTE — Telephone Encounter (Signed)
Refill request

## 2010-08-22 ENCOUNTER — Ambulatory Visit (INDEPENDENT_AMBULATORY_CARE_PROVIDER_SITE_OTHER): Payer: Commercial Managed Care - PPO | Admitting: Family Medicine

## 2010-08-22 ENCOUNTER — Encounter: Payer: Self-pay | Admitting: Family Medicine

## 2010-08-22 VITALS — BP 190/88 | HR 94 | Temp 99.0°F | Ht 63.5 in | Wt 207.0 lb

## 2010-08-22 DIAGNOSIS — G894 Chronic pain syndrome: Secondary | ICD-10-CM

## 2010-08-22 DIAGNOSIS — F32A Depression, unspecified: Secondary | ICD-10-CM

## 2010-08-22 DIAGNOSIS — F323 Major depressive disorder, single episode, severe with psychotic features: Secondary | ICD-10-CM

## 2010-08-22 DIAGNOSIS — I10 Essential (primary) hypertension: Secondary | ICD-10-CM

## 2010-08-22 MED ORDER — METOPROLOL TARTRATE 25 MG PO TABS: 25.0000 mg | ORAL_TABLET | Freq: Two times a day (BID) | ORAL | Status: DC

## 2010-08-22 NOTE — Patient Instructions (Addendum)
It was nice to see you today.  I am referring you to a Pain Clinic and Cardiologist.  Please make an appointment with DR. KANE for therapy.  I am adding a medication to help with your blood pressure - Metoprolol.

## 2010-08-26 ENCOUNTER — Encounter: Payer: Self-pay | Admitting: Family Medicine

## 2010-08-26 ENCOUNTER — Other Ambulatory Visit: Payer: Self-pay | Admitting: Family Medicine

## 2010-08-26 MED ORDER — QUETIAPINE FUMARATE 200 MG PO TABS: 100.0000 mg | ORAL_TABLET | Freq: Every day | ORAL | Status: DC

## 2010-08-26 MED ORDER — QUETIAPINE FUMARATE 200 MG PO TABS
100.0000 mg | ORAL_TABLET | Freq: Every day | ORAL | Status: DC
Start: 2010-08-26 — End: 2010-08-26

## 2010-08-26 NOTE — Assessment & Plan Note (Signed)
No red flags, though unclear as to why BP high. No edema, increased salt intake, medications, substance abuse/withdrawal. She has been on ACE for quite a while with no issues (I do not suspect renal artery stenosis). No evidence of organ damage. Normal neurological exam.

## 2010-08-26 NOTE — Progress Notes (Signed)
  Subjective:    Patient ID: [name] [name], female    DOB: 01/29/1951, 60 y.o.   MRN: [id number]  Abdominal Pain  1. Abdominal Pain/Nausea: Chronic. "My pancrease is not absorbing what it needs to absorb." Hx gastritis and possible mild pancreatitis (see hospital admission 12/11). Pain concentrated in RUQ and LUQ. No identifying factors that make it better or worse. She has chronic nausea, endorses one episode of vomiting - NBNB. No Diarrhea. Chronic constipation and she has not been taking prescribed Colace or Miralax. EGD in 12/11 - WNL. Patient DM, states that she thinks that the LUQ pain is her heart - "I feel like it is going to burst." She says that same about her epigastric region - "Sometimes, it gets really big and I think that it is going to pop." No CP, SOB. She endorses being able to tolerate vanilla pudding.  2. Eye: Patient has been followed by Dr. Dione Booze, Dr. Noel Gerold (cornea), and Dr. Luciana Axe (retina). She states that she is on multiple eye drops and may be having surgery soon. 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 This was also addressed last year for a similar complaint/concern - MRI negative. Risk factors already being addressed.  3. Depression/Anxiety: Patient admits that she has significant depression/anxiety/anger over her disability case. She has crying spells and panic attacks. Denies SI/HI. Is interested in therapy with Dr. Pascal Lux.  4. Fibromyalgia: "I have pain all over... In my veins... Sometimes it feels like my bone is coming through my flesh... Something hot is going to my brain."  5. HTN: Usually controlled. Taking all prescribed medications. No CP, SOB. No tobacco, ETOH, drugs, or withdrawal.  Review of Systems  Gastrointestinal: Positive for abdominal pain.  SEE HPI.    Objective:   Physical Exam  Constitutional: Vital signs are normal. She appears well-developed and well-nourished. No distress.    Cardiovascular: Normal rate, regular rhythm, normal heart sounds and normal pulses.   Pulmonary/Chest: Effort normal and breath sounds normal.  Abdominal: Soft. Normal appearance and bowel sounds are normal. There is no hepatosplenomegaly. There is generalized tenderness. There is no rigidity, no rebound, no guarding, no CVA tenderness and negative Murphy's sign.  Neurological: She is alert.  Psychiatric: Her mood appears anxious.     Assessment & Plan:

## 2010-08-26 NOTE — Assessment & Plan Note (Signed)
Patient amenable to therapy. I feel that she would benefit from seeing Dr. Pascal Lux alone or in [facility]. I would appreciate any help.

## 2010-08-26 NOTE — Assessment & Plan Note (Signed)
Patient has been referred to Pain Clinic. She is waiting on Medicaid. Previously discussed lifestyle changes, sleep, and non-narcotic medications for controlling this. Depression needs to be addressed her as well. I also suspect that there may be secondary gain issues here, though pain is subjective.

## 2010-08-30 ENCOUNTER — Encounter: Payer: Self-pay | Admitting: Family Medicine

## 2010-08-30 ENCOUNTER — Ambulatory Visit (INDEPENDENT_AMBULATORY_CARE_PROVIDER_SITE_OTHER): Payer: Commercial Managed Care - PPO | Admitting: Family Medicine

## 2010-08-30 VITALS — BP 170/90 | HR 86 | Temp 98.5°F | Ht 63.5 in | Wt 209.0 lb

## 2010-08-30 DIAGNOSIS — I10 Essential (primary) hypertension: Secondary | ICD-10-CM

## 2010-08-30 DIAGNOSIS — F32A Depression, unspecified: Secondary | ICD-10-CM

## 2010-08-30 DIAGNOSIS — F323 Major depressive disorder, single episode, severe with psychotic features: Secondary | ICD-10-CM

## 2010-08-30 DIAGNOSIS — G894 Chronic pain syndrome: Secondary | ICD-10-CM

## 2010-08-30 DIAGNOSIS — Z9849 Cataract extraction status, unspecified eye: Secondary | ICD-10-CM

## 2010-08-30 MED ORDER — QUETIAPINE FUMARATE 200 MG PO TABS: 200.0000 mg | ORAL_TABLET | Freq: Every day | ORAL | Status: DC

## 2010-08-30 MED ORDER — METOPROLOL TARTRATE 25 MG PO TABS: 50.0000 mg | ORAL_TABLET | Freq: Two times a day (BID) | ORAL | Status: DC

## 2010-08-30 NOTE — Progress Notes (Signed)
  Subjective:    Patient ID: [name] [name], female    DOB: 03-21-1951, 60 y.o.   MRN: [id number]  Abdominal Pain  1. Depression/Anxiety: Patient admits that she has significant depression/anxiety/anger over her disability case. She has crying spells and panic attacks. Denies SI/HI. Is interested in therapy with Dr. Pascal Lux.  2. Fibromyalgia: "I have pain all over... In my veins... Sometimes it feels like my bone is coming through my flesh... Something hot is going to my brain."  3. HTN: Usually controlled. Taking all prescribed medications. No CP, SOB, edema. No tobacco, ETOH, drugs, or withdrawal.  Review of Systems  Gastrointestinal: Positive for abdominal pain.  SEE HPI.    Objective:   Physical Exam  Constitutional: Vital signs are normal. She appears well-developed and well-nourished. No distress.  Cardiovascular: Normal rate, regular rhythm, normal heart sounds and normal pulses.   Pulmonary/Chest: Effort normal and breath sounds normal.  Abdominal: Soft. Normal appearance and bowel sounds are normal. There is no hepatosplenomegaly. There is generalized tenderness. There is no rigidity, no rebound, no guarding, no CVA tenderness and negative Murphy's sign.  Neurological: She is alert.  Psychiatric: Her mood appears anxious.     Assessment & Plan:

## 2010-08-30 NOTE — Patient Instructions (Signed)
It was nice to see you today.  I am increasing your Seroquel today.  You are due for a mammogram.  I am increasing your Metoprolol today.  Please make an appointment with Dr. Pascal Lux.

## 2010-08-30 NOTE — Assessment & Plan Note (Signed)
I am increasing Seroquel today and will make that a focus. Patient amenable to therapy. I feel that she would benefit from seeing Dr. Pascal Lux alone or in [facility]. I would appreciate any help.

## 2010-08-30 NOTE — Assessment & Plan Note (Signed)
No red flags, though unclear as to why BP high. Review of vitals DOES show increasing weight over the past few months which may be a simple way to explain her increased BP. No edema, increased salt intake, medications, substance abuse/withdrawal. She has been on ACE for quite a while with no issues (I do not suspect renal artery stenosis). No evidence of organ damage. Normal neurological exam. Increasing BB today.

## 2010-08-30 NOTE — Assessment & Plan Note (Signed)
Patient has been referred to Pain Clinic. She is waiting on Medicaid. Previously discussed lifestyle changes, sleep, and non-narcotic medications for controlling this. Depression needs to be addressed her as well. I also suspect that there may be secondary gain issues here, though pain is subjective. 

## 2010-09-03 NOTE — Op Note (Signed)
[name], [name]             ACCOUNT NO.:  [id number]   MEDICAL RECORD NO.:  [id number]          PATIENT TYPE:  AMB   LOCATION:  [facility]                         FACILITY:  [facility]   PHYSICIAN:  Lindaann Slough, M.D.  DATE OF BIRTH:  [birth year]   DATE OF PROCEDURE:  03/03/2008  DATE OF DISCHARGE:                               OPERATIVE REPORT   PREOPERATIVE DIAGNOSIS:  Pelvic pain, chronic interstitial cystitis.   POSTOP DIAGNOSIS:  Pelvic pain, chronic interstitial cystitis.   PROCEDURE:  Cystoscopy, urethral dilation, HOD and bladder instillation  of Marcaine  and Pyridium.   SURGEON:  Danae Chen, M.D.   ANESTHESIA:  General.   INDICATIONS:  The patient is a 60 year old female who has been  complaining of frequency, suprapubic pain, nocturia times three to four.  She had a negative CT scan and cystoscopy in the past.  She was treated  with Detrol, Vesicare, Enablex, imipramine without any improvement.  She  is scheduled today for cystoscopy and HOD and urethral dilation.   The patient was identified by her wrist band and proper time-out was  taken.  Then under general anesthesia, she was prepped and draped and  placed in the dorsal lithotomy position.  A panendoscope was then  inserted in the bladder.  The bladder mucosa is normal.  There is no  stone or tumor in the bladder.  The ureteral orifices are in normal  position and shape with clear efflux.  There was no evidence of  submucosal hemorrhage.  The bladder was then distended for about 10  minutes under 36 cm of water pressure.  There was no evidence of  submucosal hemorrhage.  The bladder capacity is about 650 mL.  The  cystoscope was then removed.  The urethra was then dilated with #32-  [place].  Then 15 mL of 0.5% Marcaine and 400 mg of Pyridium were  instilled in the bladder.   The patient tolerated the procedure well and left the OR in satisfactory  condition to post anesthesia care unit.      Lindaann Slough, M.D.  Electronically Signed     MN/MEDQ  D:  03/03/2008  T:  03/03/2008  Job:  [id number]

## 2010-09-03 NOTE — Group Therapy Note (Signed)
NAMEGILIANA, [name] [name] NO.:  [id number]   MEDICAL RECORD NO.:  [id number]          PATIENT TYPE:  WOC   LOCATION:  [facility]                   FACILITY:  [facility]   PHYSICIAN:  Dorthula Perfect, MD     DATE OF BIRTH:  June 12, 1950   DATE OF SERVICE:                                  CLINIC NOTE   This is a 60 year old black female, gravida 3, para 3, is self-referred  here for evaluation of pelvic pain.  When she was in her 30s, she had a  partial hysterectomy for fibroids and states that the right ovary was  removed.  She has been having low abdominal hurting for at least the  past 2-3 years, more bothersome on the left than the right side.  It  daily and is associated with cramping.  A number of years ago, she was  followed by Dr. Retia Passe for what sounds like interstitial cystitis.  She recently had a cystoscopy by Dr. Su Grand and the postop diagnosis  in the operative report with chronic interstitial cystitis.  Although,  the operative note does not show evidence of submucosal bladder  hemorrhage.  By her history, she has had blood in the urine.   She has also had had problems with intermittent constipation, diarrhea,  and blood in her stool.  She states that she has been seen recently by  Dr. Arlyce Dice at [facility] and he has told her that she might have  to be scoped both from above and from below.   Her last mammogram was 3 years ago and she said it was okay.  She has  had a Pap smear long time.  She has no vaginal bleeding.   PHYSICAL EXAMINATION:  GENERAL:  Height 5 feet 3, weight 193, and blood  pressure 117/74.  BREASTS:  Examination of her breasts were bilaterally normal.  ABDOMEN:  Obese with a large panniculus with surgical scarring in the  suprapubic area.  No masses are felt.  PELVIC:  External genitalia and BUS glands are normal.  Vagina was  epithelialized as was the vaginal cuff.  No abnormalities were noted.  Bimanual examination is  performed with some difficulty because of her  body habitus and tenderness.  No masses are felt.  RECTAL:  Deferred.  She stated that Dr. Arlyce Dice had recently done this.   IMPRESSION:  Low abdominal pain.  I doubt that this has a gynecology  etiology.   DISPOSITION:  1. Screening mammogram, electively.  2. Low abdominal pelvic ultrasound to evaluate the ovaries.  3. The patient is instructed to get back with Dr. Brunilda Payor regarding the      results and then she get back with Dr. Arlyce Dice      for evaluation of rectal bleeding.  I explained to the patient that      if she does have chronic interstitial cystitis that this can be an      ongoing problem.  The patient will return in 2-3 weeks for results      of her ultrasound.           ______________________________  Dennard Nip  Perlie Gold, MD     ER/MEDQ  D:  03/10/2008  T:  03/10/2008  Job:  [id number]   cc:   [facility]   Dr. Mart Piggs, [facility]

## 2010-09-06 NOTE — Op Note (Signed)
NAMEIRIDIAN, READER             ACCOUNT NO.:  [id number]   MEDICAL RECORD NO.:  [id number]          PATIENT TYPE:  AMB   LOCATION:  [facility]O                         FACILITY:  [facility]   PHYSICIAN:  Graylin Shiver, M.D.   DATE OF BIRTH:  [birth year]   DATE OF PROCEDURE:  01/23/2006  DATE OF DISCHARGE:  01/23/2006                                 OPERATIVE REPORT   PROCEDURE:  Colonoscopy.   INDICATIONS:  Rectal bleeding.   Informed consent was obtained after explanation of the risks of bleeding,  infection and perforation.   PREMEDICATION:  The procedure was done after an EGD, see EGD report for  total amounts medication.   DESCRIPTION OF PROCEDURE:  With the patient in the left lateral decubitus  position, a rectal exam was performed, no masses were felt.  The Olympus  colonoscope was inserted into the rectum and advanced around the colon to  the cecum.  Cecal landmarks were identified.  The cecum and ascending colon  were normal.  The transverse colon normal.  The descending colon, sigmoid  and rectum were normal.  There were some internal hemorrhoid seen.  She  tolerated the procedure well without complications.   IMPRESSION:  Internal hemorrhoids otherwise normal colonoscopy to the cecum.           ______________________________  Graylin Shiver, M.D.     SFG/MEDQ  D:  01/26/2006  T:  01/27/2006  Job:  [id number]

## 2010-09-06 NOTE — Op Note (Signed)
NAMETORRIN, FREIN             ACCOUNT NO.:  [id number]   MEDICAL RECORD NO.:  [id number]          PATIENT TYPE:  AMB   LOCATION:  ENDO                         FACILITY:  [facility]   PHYSICIAN:  Graylin Shiver, M.D.   DATE OF BIRTH:  [birth year]   DATE OF PROCEDURE:  01/23/2006  DATE OF DISCHARGE:  01/23/2006                                 OPERATIVE REPORT   INDICATIONS FOR PROCEDURE:  Epigastric burning pain   INFORMED CONSENT:  An informed consent was obtained after an explanation of  the risks of bleeding, infection and perforation.   PREMEDICATION:  Fentanyl 100 mcg IV, Versed 9 mg IV.   PROCEDURE:  With the patient in the left lateral decubitus position, the  Olympus gastroscope was inserted into the oropharynx and passed into the  esophagus.  It was advanced down the esophagus, then into the stomach and  into the duodenum.  The second portion and bulb of the duodenum were normal.  The stomach showed erythematous mucosa compatible with gastritis.  No ulcers  or erosions were seen.  Biopsies were obtained from the distal stomach.  No  lesions were seen in the fundus or cardia.  The esophagus looked normal.   She tolerated the procedure well without complications.   IMPRESSION:  Gastritis.   PLAN:  The biopsies will be checked.           ______________________________  Graylin Shiver, M.D.     SFG/MEDQ  D:  01/26/2006  T:  01/27/2006  Job:  [id number]   cc:   Sibyl Parr. Darrick Penna, M.D.

## 2010-09-13 ENCOUNTER — Ambulatory Visit: Payer: Commercial Managed Care - PPO | Admitting: Family Medicine

## 2010-09-17 ENCOUNTER — Ambulatory Visit: Payer: Commercial Managed Care - PPO | Admitting: Family Medicine

## 2010-09-25 ENCOUNTER — Ambulatory Visit (INDEPENDENT_AMBULATORY_CARE_PROVIDER_SITE_OTHER): Payer: Commercial Managed Care - PPO | Admitting: Family Medicine

## 2010-09-25 ENCOUNTER — Encounter: Payer: Self-pay | Admitting: Family Medicine

## 2010-09-25 DIAGNOSIS — R300 Dysuria: Secondary | ICD-10-CM

## 2010-09-25 DIAGNOSIS — D649 Anemia, unspecified: Secondary | ICD-10-CM

## 2010-09-25 DIAGNOSIS — F323 Major depressive disorder, single episode, severe with psychotic features: Secondary | ICD-10-CM

## 2010-09-25 DIAGNOSIS — R053 Chronic cough: Secondary | ICD-10-CM | POA: Insufficient documentation

## 2010-09-25 DIAGNOSIS — F32A Depression, unspecified: Secondary | ICD-10-CM

## 2010-09-25 DIAGNOSIS — I10 Essential (primary) hypertension: Secondary | ICD-10-CM

## 2010-09-25 DIAGNOSIS — J329 Chronic sinusitis, unspecified: Secondary | ICD-10-CM

## 2010-09-25 DIAGNOSIS — G894 Chronic pain syndrome: Secondary | ICD-10-CM

## 2010-09-25 DIAGNOSIS — E119 Type 2 diabetes mellitus without complications: Secondary | ICD-10-CM

## 2010-09-25 DIAGNOSIS — R21 Rash and other nonspecific skin eruption: Secondary | ICD-10-CM

## 2010-09-25 LAB — COMPREHENSIVE METABOLIC PANEL
ALT: 19 U/L (ref 0–35)
AST: 19 U/L (ref 0–37)
Albumin: 4.2 g/dL (ref 3.5–5.2)
Alkaline Phosphatase: 91 U/L (ref 39–117)
BUN: 20 mg/dL (ref 6–23)
CO2: 28 mEq/L (ref 19–32)
Calcium: 9.5 mg/dL (ref 8.4–10.5)
Chloride: 101 mEq/L (ref 96–112)
Creat: 0.92 mg/dL (ref 0.50–1.10)
Glucose, Bld: 152 mg/dL — ABNORMAL HIGH (ref 70–99)
Potassium: 3.7 mEq/L (ref 3.5–5.3)
Sodium: 139 mEq/L (ref 135–145)
Total Bilirubin: 0.3 mg/dL (ref 0.3–1.2)
Total Protein: 7.3 g/dL (ref 6.0–8.3)

## 2010-09-25 LAB — POCT URINALYSIS DIPSTICK
Bilirubin, UA: NEGATIVE
Blood, UA: NEGATIVE
Glucose, UA: NEGATIVE
Ketones, UA: NEGATIVE
Leukocytes, UA: NEGATIVE
Nitrite, UA: NEGATIVE
Protein, UA: NEGATIVE
Spec Grav, UA: 1.01
Urobilinogen, UA: 0.2
pH, UA: 5.5

## 2010-09-25 LAB — CBC
HCT: 38.2 % (ref 36.0–46.0)
Hemoglobin: 12.3 g/dL (ref 12.0–15.0)
MCH: 27.6 pg (ref 26.0–34.0)
MCHC: 32.2 g/dL (ref 30.0–36.0)
MCV: 85.7 fL (ref 78.0–100.0)
Platelets: 184 10*3/uL (ref 150–400)
RBC: 4.46 MIL/uL (ref 3.87–5.11)
RDW: 14.2 % (ref 11.5–15.5)
WBC: 6.9 10*3/uL (ref 4.0–10.5)

## 2010-09-25 MED ORDER — PREGABALIN 25 MG PO CAPS: 25.0000 mg | ORAL_CAPSULE | Freq: Three times a day (TID) | ORAL | Status: DC

## 2010-09-25 MED ORDER — HYDROCORTISONE 1 % EX CREA: TOPICAL_CREAM | CUTANEOUS | Status: AC

## 2010-09-25 MED ORDER — AMOXICILLIN 500 MG PO CAPS: 500.0000 mg | ORAL_CAPSULE | Freq: Two times a day (BID) | ORAL | Status: AC

## 2010-09-25 NOTE — Assessment & Plan Note (Signed)
Increase Seroquel. Would appreciated help from [facility].

## 2010-09-25 NOTE — Assessment & Plan Note (Signed)
Discussed previous medications, including Lyrica. Patient remembered improvement in pain and thinks that her previous endorsement of reaction had to do with another medication that she was taking at the same time. Endorsed pain and twitching as previous reaction. Would like to try low dose Lyrica again.

## 2010-09-25 NOTE — Progress Notes (Signed)
  Subjective:    Patient ID: [name] [name], female    DOB: October 08, 1950, 60 y.o.   MRN: [id number]  Abdominal Pain  1. Depression/Anxiety: Patient admits that she has significant depression/anxiety/anger over her disability case - though happier today since she was recently approved for disability through [employer]. She has crying spells and panic attacks. Denies SI/HI. Is interested in therapy with Dr. Pascal Lux, but hasn't called yet. Still taking Seroquel, but only 1/2 q hs (though we discussed titration at the last visit).  2. Fibromyalgia: "I have pain all over... In my veins... Sometimes it feels like my bone is coming through my flesh... Something hot is going to my brain... Moves all over my body... Something crawling on the inside... In my neck, legs, knees, shins... Feels like burning and sticking."  3. Sinus Pain: x 2 weeks, described as sinus pain and pressure, no fever/chills. Endorses HA, dizziness, ear pain, rhinitis, congestion, thick white mucus from throat.   4. HTN: Controlled today.  5. Eye: Patient has been followed by Dr. Dione Booze, Dr. Noel Gerold (cornea), and Dr. Luciana Axe (retina). She states that she is on multiple eye drops and may be having surgery soon. 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 This was also addressed last year for a similar complaint/concern - MRI negative at that time. Risk factors already being addressed. Patient states that she was referred to [facility] and had another MRI last week - "okay." Will request records.  Review of Systems  Gastrointestinal: Positive for abdominal pain.  SEE HPI. Abdominal pain chronic. See previous note for details. No change.    Objective:   Physical Exam  Constitutional: Vital signs are normal. She appears well-developed and well-nourished. No distress.  Cardiovascular: Normal rate, regular rhythm, normal heart sounds and normal pulses.   Pulmonary/Chest: Effort  normal and breath sounds normal.  Abdominal: Soft. Normal appearance and bowel sounds are normal. There is no hepatosplenomegaly. There is generalized tenderness. There is no rigidity, no rebound, no guarding, no CVA tenderness and negative Murphy's sign.  Neurological: She is alert.  Psychiatric: Her mood appears anxious.  HEENT: NCAT, PERRL, EOMI, TM and canal normal bilaterally, nasal erythema and edema, no OP erythema or exudates, no lymphadenopathy. Extremeties: Slight discoloration pointed out by patient consistent with possible stasis changes, though no edema today.   Assessment & Plan:

## 2010-09-25 NOTE — Assessment & Plan Note (Signed)
Very mild. 1% hydrocortisone.

## 2010-09-25 NOTE — Patient Instructions (Signed)
It was nice to see you today!  I am prescribing an antibiotic for your sinusitis.   I am prescribing a cream for your rash.  Please sign a consent to have records sent from [facility].  I will speak to your next PCP for an easier transition.

## 2010-09-25 NOTE — Assessment & Plan Note (Signed)
Soft call, but will treat for bacterial sinusitis based on patient's s/s.

## 2010-10-09 ENCOUNTER — Telehealth: Payer: Self-pay | Admitting: Family Medicine

## 2010-10-09 NOTE — Telephone Encounter (Signed)
Pt is c/o of stomach pain and son said that Dr Earlene Plater was supposed to have sent in rx for her stomach.  Wants to know if he can talk to Dr Earlene Plater about this.

## 2010-10-09 NOTE — Telephone Encounter (Signed)
Attempted to call patient no answer or voicemail. If she returns call please ask her which meds she need sent to pharmacy.[name], [name]

## 2010-10-27 ENCOUNTER — Emergency Department (HOSPITAL_COMMUNITY): Payer: 59

## 2010-10-27 ENCOUNTER — Inpatient Hospital Stay (HOSPITAL_COMMUNITY)
Admission: EM | Admit: 2010-10-27 | Discharge: 2010-10-31 | DRG: 439 | Disposition: A | Discharge status: Home / Self Care | Payer: 59 | Attending: Family Medicine | Admitting: Family Medicine

## 2010-10-27 DIAGNOSIS — E1149 Type 2 diabetes mellitus with other diabetic neurological complication: Secondary | ICD-10-CM | POA: Diagnosis present

## 2010-10-27 DIAGNOSIS — K5900 Constipation, unspecified: Secondary | ICD-10-CM | POA: Diagnosis present

## 2010-10-27 DIAGNOSIS — K859 Acute pancreatitis without necrosis or infection, unspecified: Secondary | ICD-10-CM

## 2010-10-27 DIAGNOSIS — IMO0001 Reserved for inherently not codable concepts without codable children: Secondary | ICD-10-CM | POA: Diagnosis present

## 2010-10-27 DIAGNOSIS — G894 Chronic pain syndrome: Secondary | ICD-10-CM | POA: Diagnosis present

## 2010-10-27 DIAGNOSIS — K219 Gastro-esophageal reflux disease without esophagitis: Secondary | ICD-10-CM | POA: Diagnosis present

## 2010-10-27 DIAGNOSIS — J45909 Unspecified asthma, uncomplicated: Secondary | ICD-10-CM | POA: Diagnosis present

## 2010-10-27 DIAGNOSIS — I10 Essential (primary) hypertension: Secondary | ICD-10-CM | POA: Diagnosis present

## 2010-10-27 DIAGNOSIS — J309 Allergic rhinitis, unspecified: Secondary | ICD-10-CM | POA: Diagnosis present

## 2010-10-27 DIAGNOSIS — F17200 Nicotine dependence, unspecified, uncomplicated: Secondary | ICD-10-CM | POA: Diagnosis present

## 2010-10-27 DIAGNOSIS — D649 Anemia, unspecified: Secondary | ICD-10-CM | POA: Diagnosis present

## 2010-10-27 DIAGNOSIS — E785 Hyperlipidemia, unspecified: Secondary | ICD-10-CM | POA: Diagnosis present

## 2010-10-27 DIAGNOSIS — F329 Major depressive disorder, single episode, unspecified: Secondary | ICD-10-CM | POA: Diagnosis present

## 2010-10-27 DIAGNOSIS — H409 Unspecified glaucoma: Secondary | ICD-10-CM | POA: Diagnosis present

## 2010-10-27 DIAGNOSIS — K3184 Gastroparesis: Secondary | ICD-10-CM | POA: Diagnosis present

## 2010-10-27 DIAGNOSIS — G8929 Other chronic pain: Secondary | ICD-10-CM

## 2010-10-27 DIAGNOSIS — E119 Type 2 diabetes mellitus without complications: Secondary | ICD-10-CM

## 2010-10-27 LAB — CBC
HCT: 37.8 % (ref 36.0–46.0)
Hemoglobin: 12.9 g/dL (ref 12.0–15.0)
MCH: 28.7 pg (ref 26.0–34.0)
MCHC: 34.1 g/dL (ref 30.0–36.0)
MCV: 84 fL (ref 78.0–100.0)
Platelets: 200 10*3/uL (ref 150–400)
RBC: 4.5 MIL/uL (ref 3.87–5.11)
RDW: 14.5 % (ref 11.5–15.5)
WBC: 8.3 10*3/uL (ref 4.0–10.5)

## 2010-10-27 LAB — DIFFERENTIAL
Basophils Absolute: 0.1 10*3/uL (ref 0.0–0.1)
Basophils Relative: 1 % (ref 0–1)
Eosinophils Absolute: 0.1 10*3/uL (ref 0.0–0.7)
Eosinophils Relative: 1 % (ref 0–5)
Lymphocytes Relative: 37 % (ref 12–46)
Lymphs Abs: 3.1 10*3/uL (ref 0.7–4.0)
Monocytes Absolute: 0.5 10*3/uL (ref 0.1–1.0)
Monocytes Relative: 6 % (ref 3–12)
Neutro Abs: 4.5 10*3/uL (ref 1.7–7.7)
Neutrophils Relative %: 55 % (ref 43–77)

## 2010-10-27 LAB — URINALYSIS, ROUTINE W REFLEX MICROSCOPIC
Bilirubin Urine: NEGATIVE
Glucose, UA: 500 mg/dL — AB
Hgb urine dipstick: NEGATIVE
Ketones, ur: NEGATIVE mg/dL
Leukocytes, UA: NEGATIVE
Nitrite: NEGATIVE
Protein, ur: NEGATIVE mg/dL
Specific Gravity, Urine: 1.01 (ref 1.005–1.030)
Urobilinogen, UA: 1 mg/dL (ref 0.0–1.0)
pH: 7.5 (ref 5.0–8.0)

## 2010-10-27 LAB — POCT I-STAT, CHEM 8
BUN: 7 mg/dL (ref 6–23)
Calcium, Ion: 1.09 mmol/L — ABNORMAL LOW (ref 1.12–1.32)
Chloride: 98 mEq/L (ref 96–112)
Creatinine, Ser: 0.7 mg/dL (ref 0.50–1.10)
Glucose, Bld: 248 mg/dL — ABNORMAL HIGH (ref 70–99)
HCT: 43 % (ref 36.0–46.0)
Hemoglobin: 14.6 g/dL (ref 12.0–15.0)
Potassium: 3.3 mEq/L — ABNORMAL LOW (ref 3.5–5.1)
Sodium: 140 mEq/L (ref 135–145)
TCO2: 27 mmol/L (ref 0–100)

## 2010-10-27 LAB — LIPASE, BLOOD: Lipase: 158 U/L — ABNORMAL HIGH (ref 11–59)

## 2010-10-28 ENCOUNTER — Encounter: Payer: Self-pay | Admitting: Family Medicine

## 2010-10-28 DIAGNOSIS — R1084 Generalized abdominal pain: Secondary | ICD-10-CM

## 2010-10-28 DIAGNOSIS — K5900 Constipation, unspecified: Secondary | ICD-10-CM

## 2010-10-28 LAB — CBC
HCT: 35.5 % — ABNORMAL LOW (ref 36.0–46.0)
HCT: 37.3 % (ref 36.0–46.0)
Hemoglobin: 11.6 g/dL — ABNORMAL LOW (ref 12.0–15.0)
Hemoglobin: 12.2 g/dL (ref 12.0–15.0)
MCH: 27.7 pg (ref 26.0–34.0)
MCH: 27.9 pg (ref 26.0–34.0)
MCHC: 32.7 g/dL (ref 30.0–36.0)
MCHC: 32.7 g/dL (ref 30.0–36.0)
MCV: 84.7 fL (ref 78.0–100.0)
MCV: 85.4 fL (ref 78.0–100.0)
Platelets: 191 10*3/uL (ref 150–400)
Platelets: 202 10*3/uL (ref 150–400)
RBC: 4.19 MIL/uL (ref 3.87–5.11)
RBC: 4.37 MIL/uL (ref 3.87–5.11)
RDW: 14.8 % (ref 11.5–15.5)
RDW: 14.7 % (ref 11.5–15.5)
WBC: 8.7 10*3/uL (ref 4.0–10.5)
WBC: 7.5 10*3/uL (ref 4.0–10.5)

## 2010-10-28 LAB — GLUCOSE, CAPILLARY
Glucose-Capillary: 192 mg/dL — ABNORMAL HIGH (ref 70–99)
Glucose-Capillary: 149 mg/dL — ABNORMAL HIGH (ref 70–99)
Glucose-Capillary: 192 mg/dL — ABNORMAL HIGH (ref 70–99)
Glucose-Capillary: 170 mg/dL — ABNORMAL HIGH (ref 70–99)

## 2010-10-28 LAB — LIPID PANEL
Cholesterol: 162 mg/dL (ref 0–200)
HDL: 53 mg/dL (ref >39)
LDL Cholesterol: 82 mg/dL (ref 0–99)
Total CHOL/HDL Ratio: 3.1 RATIO
Triglycerides: 135 mg/dL (ref <150)
VLDL: 27 mg/dL (ref 0–40)

## 2010-10-28 LAB — BASIC METABOLIC PANEL
BUN: 8 mg/dL (ref 6–23)
CO2: 25 mEq/L (ref 19–32)
Calcium: 8.5 mg/dL (ref 8.4–10.5)
Chloride: 101 mEq/L (ref 96–112)
Creatinine, Ser: 0.61 mg/dL (ref 0.50–1.10)
GFR calc Af Amer: >60 mL/min (ref >60)
GFR calc non Af Amer: >60 mL/min (ref >60)
Glucose, Bld: 163 mg/dL — ABNORMAL HIGH (ref 70–99)
Potassium: 3.9 mEq/L (ref 3.5–5.1)
Sodium: 136 mEq/L (ref 135–145)

## 2010-10-28 LAB — COMPREHENSIVE METABOLIC PANEL
ALT: 42 U/L — ABNORMAL HIGH (ref 0–35)
AST: 60 U/L — ABNORMAL HIGH (ref 0–37)
Albumin: 3.7 g/dL (ref 3.5–5.2)
Alkaline Phosphatase: 108 U/L (ref 39–117)
BUN: 9 mg/dL (ref 6–23)
CO2: 27 mEq/L (ref 19–32)
Calcium: 8.8 mg/dL (ref 8.4–10.5)
Chloride: 98 mEq/L (ref 96–112)
Creatinine, Ser: 0.59 mg/dL (ref 0.50–1.10)
GFR calc Af Amer: >60 mL/min (ref >60)
GFR calc non Af Amer: >60 mL/min (ref >60)
Glucose, Bld: 223 mg/dL — ABNORMAL HIGH (ref 70–99)
Potassium: 3.1 mEq/L — ABNORMAL LOW (ref 3.5–5.1)
Sodium: 136 mEq/L (ref 135–145)
Total Bilirubin: 0.3 mg/dL (ref 0.3–1.2)
Total Protein: 7.6 g/dL (ref 6.0–8.3)

## 2010-10-28 LAB — DIFFERENTIAL
Basophils Absolute: 0 10*3/uL (ref 0.0–0.1)
Basophils Relative: 0 % (ref 0–1)
Eosinophils Absolute: 0.1 10*3/uL (ref 0.0–0.7)
Eosinophils Relative: 1 % (ref 0–5)
Lymphocytes Relative: 27 % (ref 12–46)
Lymphs Abs: 2.3 10*3/uL (ref 0.7–4.0)
Monocytes Absolute: 0.6 10*3/uL (ref 0.1–1.0)
Monocytes Relative: 7 % (ref 3–12)
Neutro Abs: 5.7 10*3/uL (ref 1.7–7.7)
Neutrophils Relative %: 65 % (ref 43–77)

## 2010-10-28 LAB — HEMOGLOBIN A1C
Hgb A1c MFr Bld: 8.2 % — ABNORMAL HIGH (ref <5.7)
Mean Plasma Glucose: 189 mg/dL — ABNORMAL HIGH (ref <117)

## 2010-10-28 LAB — LIPASE, BLOOD: Lipase: 90 U/L — ABNORMAL HIGH (ref 11–59)

## 2010-10-28 MED ORDER — IOHEXOL 300 MG/ML  SOLN
100.0000 mL | Freq: Once | INTRAMUSCULAR | Status: AC | PRN
  Administered 2010-10-28: 100 mL via INTRAVENOUS

## 2010-10-28 NOTE — H&P (Signed)
Family Medicine Teaching [facility] Admission History and Physical  Patient name: [name] [name] Medical record number: [id number] Date of birth: 1950-08-02 Age: 60 y.o. Gender: female  Primary Care Provider: Doree Albee, MD  Chief Complaint:Abdominal pain  History of Present Illness: [name] is a 60 y.o. year old female with a past medical history of pancreatitis (03/2010) of unknown origin, cholecystectomy, chronic pain syndrome, hypertension, and DM Type II presenting with worsening abdominal pain over the last 5 days. The patient says that she was seen by her primary care provider  3 weeks ago for extremity pain and was started on Lyrica for suspected worsening fibromyalgia. Since that time, she began to experience worsening abdominal pain which acutely worsened within the last 5 days. The patient describes the pain as sharp and stabbing in her epigastric region radiating to her back and at times to her left flank. She says the pain is almost constant and is worse after meals.  The pain is similar to her pancreatitis in December but  Slightly more intense. She has begun to eat primarily jello and broth. She complains of some nausea but no vomiting. She reports she had a fever to 100.4 on 7/8/201 but otherwise has been afebrile. She says her blood pressure has been poorly controlled during this time with systolic blood pressures over 308. She denies alcohol use. Patient denies bright red blood per rectum, hematemesis, hemoptysis.    Patient Active Problem List  Diagnoses  . DIABETES MELLITUS, TYPE II  . DEPRESSION, MAJOR, WITH PSYCHOTIC BEHAVIOR  . TOBACCO USER  . CHRONIC PAIN SYNDROME  . HYPERTENSION, BENIGN ESSENTIAL  . GASTROESOPHAGEAL REFLUX DISEASE, CHRONIC  . CONSTIPATION  . URINARY INCONTINENCE  . CATARACT EXTRACTIONS, BILATERAL, HX OF  . Allergic rhinitis  . Anemia   Past Medical History: Past Medical History  Diagnosis Date  . Constipation   . Chronic pain  syndrome   . Depression   . Diabetes mellitus   . GERD (gastroesophageal reflux disease)   . Hyperlipidemia   . Hypertension   . Allergy   . Cataract   . Urinary incontinence   . Fibromyalgia     Past Surgical History: No past surgical history on file. S/p cholecystectomy  Social History: History   Social History  . Marital Status: Widowed    Spouse Name: N/A    Number of Children: N/A  . Years of Education: N/A   Social History Main Topics  . Smoking status: Current Some Day Smoker -- 0.3 packs/day for 30 years    Types: Cigarettes  . Smokeless tobacco: Never Used   Comment: started at age 58 - quit for several years.  Restarted with death of child.   . Alcohol Use: No  . Drug Use: No  . Sexually Active: No   Other Topics Concern  . Not on file   Social History Narrative  . No narrative on file    Family History: No family history on file. Daughter with CAD, MI age 68 Sister with DM  Allergies: Allergies  Allergen Reactions  . Desvenlafaxine     REACTION: GI upset  . Duloxetine     REACTION: "wheezing" and nausea  . Lithium Rash    No current facility-administered medications for this visit.   Current Outpatient Prescriptions  Medication Sig Dispense Refill  . albuterol (PROAIR HFA) 108 (90 BASE) MCG/ACT inhaler Inhale 2 puffs into the lungs every 6 (six) hours as needed for wheezing.  1 Inhaler  1  .  cetirizine (ZYRTEC) 10 MG tablet Take 10 mg by mouth daily as needed.        . docusate sodium (COLACE) 100 MG capsule Take 100 mg by mouth 2 (two) times daily.        Marland Kitchen FLUoxetine (PROZAC) 40 MG capsule Take 40 mg by mouth daily.        . fluticasone (FLONASE) 50 MCG/ACT nasal spray 1 spray by Nasal route daily.  16 g  2  . glipiZIDE (GLUCOTROL) 5 MG 24 hr tablet TAKE 1 TABLET BY MOUTH ONCE DAILY  30 tablet  PRN  . glucose blood (ONE TOUCH TEST STRIPS) test strip Check blood sugar twice daily. Dispense QS       . hydrochlorothiazide 25 MG tablet Take  25 mg by mouth every morning.        . hydrocortisone (ANUSOL-HC) 2.5 % rectal cream Place rectally as directed. Per box instructions       . hydrocortisone 1 % cream Apply to affected area 2 times daily  30 g  1  . Hypromellose (ARTIFICIAL TEARS OP) 1 drop in each eye as needed for dry eye       . lisinopril (PRINIVIL,ZESTRIL) 40 MG tablet Take 40 mg by mouth daily.        . metoprolol tartrate (LOPRESSOR) 25 MG tablet Take 2 tablets (50 mg total) by mouth 2 (two) times daily.  60 tablet  11  . NEXIUM 40 MG capsule TAKE 1 CAPSULE BY MOUTH EVERY MORNING  30 capsule  2  . nortriptyline (PAMELOR) 25 MG capsule Take 1 capsule (25 mg total) by mouth at bedtime.  30 capsule  2  . nystatin (MYCOSTATIN) cream Apply topically 2 (two) times daily as needed.        . ondansetron (ZOFRAN) 4 MG tablet Take 1 tablet (4 mg total) by mouth every 8 (eight) hours as needed.  20 tablet  0  . ONETOUCH DELICA LANCETS MISC Test blood sugar 2 times daily. Dispense QS       . polyethylene glycol (GLYCOLAX) packet Take 17 g by mouth daily. Mix in 4-6 oz of water      . prednisoLONE acetate (PRED FORTE) 1 % ophthalmic suspension Place 1 drop into both eyes 2 (two) times daily.        . pregabalin (LYRICA) 25 MG capsule Take 1 capsule (25 mg total) by mouth 3 (three) times daily.  90 capsule  2  . QUEtiapine (SEROQUEL) 200 MG tablet Take 1 tablet (200 mg total) by mouth at bedtime.  30 tablet  1   Facility-Administered Medications Ordered in Other Visits  Medication Dose Route Frequency Provider Last Rate Last Dose  . iohexol (OMNIPAQUE) 300 MG/ML injection 100 mL  100 mL Intravenous Once PRN Medication Radiologist   100 mL at 10/28/10 0149   Review Of Systems: Per HPI with the following additions: occasional headache, constipation Otherwise 12 point review of systems was performed and was unremarkable.  Physical Exam: Pulse: 101-107  Blood Pressure: 188/99 RR: 15-18   O2: 100% on RA Temp: 99.4  General: alert,  cooperative and no distress HEENT: PERRLA, extra ocular movement intact and neck supple with midline trachea Heart: S1, S2 normal, no murmur, rub or gallop, regular rate and rhythm Lungs: clear to auscultation, no wheezes or rales and unlabored breathing Abdomen: mild tenderness in the in the epigastrium., no rebound tenderness, no guarding or rigidity Extremities: extremities normal, atraumatic, no cyanosis or edema Skin:no rashes Neurology:  normal without focal findings, mental status, speech normal, alert and oriented x3 and reflexes normal and symmetric  Labs and Imaging: Lab Results  Component Value Date/Time   NA 140 10/27/2010 10:02 PM   K 3.3* 10/27/2010 10:02 PM   CL 98 10/27/2010 10:02 PM   CO2 28 09/25/2010  9:35 AM   BUN 7 10/27/2010 10:02 PM   CREATININE 0.70 10/27/2010 10:02 PM   CREATININE 0.92 09/25/2010  9:35 AM   GLUCOSE 248* 10/27/2010 10:02 PM   Lab Results  Component Value Date   WBC 8.3 10/27/2010   HGB 14.6 10/27/2010   HCT 43.0 10/27/2010   MCV 84.0 10/27/2010   PLT 200 10/27/2010   Lipase 158 Urinalysis unremarkable  CT chest, abdomen, pelvis 7/9-Midline ventral abdominal wall hernia containing transverse colon   without proximal obstruction.  Small umbilical hernia containing   fat.  Diffuse fatty infiltration of the liver.  No abscess or acute   inflammatory process demonstrated.   Assessment and Plan: Chandlar Staebell is a 60 y.o. year old female  With a history of pancreatitis presenting with abdominal pain and an elevated lipase consistent with mild pancreatitis.  1. Pancreatitis-Pt with elevated lipase and CT scan unremarkable pancreatic changes on CT scan. Do not suspect alcohol as cause and patient with cholecystectomy. Patient with no new recent meds except for lyrica (which is not a class I-IV drug associated with pancreatitis). Patient smokes about 1/3 a pack per day which is a contributing factor for pancreatitis.  No recent history of trauma.   -Hydrate patient  with NS at 175 cc/hr.   -zofran prn, dilaudid prn  -NPO except for meds and ice chips  -consider advancing diet in AM -check lipids for hypertriglyceridemia, check CMET for hypercalcemia  -encourage smoking cessation    2. HTN-continue home metoprolol, hctz, lisinopril. Cover patient for SBP>200 with hydralazine 10mg  IV q4h. Suspect pain control will also help with BP management.  3. DM-check CBGs and cover with sensitive sliding scale insulin. Hold home glipizide 4. Depression/sleep-continue Seroquel, nortriptyline, Prozac.  5. Chronic Pain/Fibromyalgia-continue lyrica.  6. GERD-continue home protonix.  7. hyperlididemia-will check lipids.  8. Asthma-well controlled, continue albuterol prn.  9. Glaucoma-continue pred forte, brimonidine, and dorzolamine.  10. Full Code-need to readdress in AM 11. FEN/GI: NS 175/hour 12. Prophylaxis: will start heparin subq 13. Disposition: pending clinical improvement

## 2010-10-29 ENCOUNTER — Inpatient Hospital Stay (HOSPITAL_COMMUNITY): Payer: 59

## 2010-10-29 LAB — BASIC METABOLIC PANEL
BUN: 7 mg/dL (ref 6–23)
CO2: 28 mEq/L (ref 19–32)
Calcium: 8.7 mg/dL (ref 8.4–10.5)
Chloride: 104 mEq/L (ref 96–112)
Creatinine, Ser: 0.6 mg/dL (ref 0.50–1.10)
GFR calc Af Amer: >60 mL/min (ref >60)
GFR calc non Af Amer: >60 mL/min (ref >60)
Glucose, Bld: 179 mg/dL — ABNORMAL HIGH (ref 70–99)
Potassium: 4.6 mEq/L (ref 3.5–5.1)
Sodium: 137 mEq/L (ref 135–145)

## 2010-10-29 LAB — CBC
HCT: 34 % — ABNORMAL LOW (ref 36.0–46.0)
Hemoglobin: 10.9 g/dL — ABNORMAL LOW (ref 12.0–15.0)
MCH: 27.5 pg (ref 26.0–34.0)
MCHC: 32.1 g/dL (ref 30.0–36.0)
MCV: 85.9 fL (ref 78.0–100.0)
Platelets: 168 10*3/uL (ref 150–400)
RBC: 3.96 MIL/uL (ref 3.87–5.11)
RDW: 14.8 % (ref 11.5–15.5)
WBC: 6.7 10*3/uL (ref 4.0–10.5)

## 2010-10-29 LAB — GLUCOSE, CAPILLARY
Glucose-Capillary: 194 mg/dL — ABNORMAL HIGH (ref 70–99)
Glucose-Capillary: 165 mg/dL — ABNORMAL HIGH (ref 70–99)
Glucose-Capillary: 145 mg/dL — ABNORMAL HIGH (ref 70–99)

## 2010-10-29 MED ORDER — TECHNETIUM TC 99M SULFUR COLLOID
2.0000 | Freq: Once | INTRAVENOUS | Status: AC | PRN
  Administered 2010-10-29: 2.2 via INTRAVENOUS

## 2010-10-30 LAB — GLUCOSE, CAPILLARY
Glucose-Capillary: 162 mg/dL — ABNORMAL HIGH (ref 70–99)
Glucose-Capillary: 170 mg/dL — ABNORMAL HIGH (ref 70–99)
Glucose-Capillary: 195 mg/dL — ABNORMAL HIGH (ref 70–99)
Glucose-Capillary: 197 mg/dL — ABNORMAL HIGH (ref 70–99)
Glucose-Capillary: 163 mg/dL — ABNORMAL HIGH (ref 70–99)

## 2010-10-30 LAB — COMPREHENSIVE METABOLIC PANEL
ALT: 77 U/L — ABNORMAL HIGH (ref 0–35)
AST: 79 U/L — ABNORMAL HIGH (ref 0–37)
Albumin: 3.3 g/dL — ABNORMAL LOW (ref 3.5–5.2)
Alkaline Phosphatase: 98 U/L (ref 39–117)
BUN: 7 mg/dL (ref 6–23)
CO2: 23 mEq/L (ref 19–32)
Calcium: 8.6 mg/dL (ref 8.4–10.5)
Chloride: 104 mEq/L (ref 96–112)
Creatinine, Ser: 0.69 mg/dL (ref 0.50–1.10)
GFR calc Af Amer: >60 mL/min (ref >60)
GFR calc non Af Amer: >60 mL/min (ref >60)
Glucose, Bld: 173 mg/dL — ABNORMAL HIGH (ref 70–99)
Potassium: 3.6 mEq/L (ref 3.5–5.1)
Sodium: 139 mEq/L (ref 135–145)
Total Bilirubin: 0.3 mg/dL (ref 0.3–1.2)
Total Protein: 6.8 g/dL (ref 6.0–8.3)

## 2010-10-30 LAB — CBC
HCT: 31 % — ABNORMAL LOW (ref 36.0–46.0)
Hemoglobin: 10.2 g/dL — ABNORMAL LOW (ref 12.0–15.0)
MCH: 28.1 pg (ref 26.0–34.0)
MCHC: 32.9 g/dL (ref 30.0–36.0)
MCV: 85.4 fL (ref 78.0–100.0)
Platelets: 184 10*3/uL (ref 150–400)
RBC: 3.63 MIL/uL — ABNORMAL LOW (ref 3.87–5.11)
RDW: 14.7 % (ref 11.5–15.5)
WBC: 5.2 10*3/uL (ref 4.0–10.5)

## 2010-10-30 LAB — POCT OCCULT BLOOD STOOL (DEVICE): Fecal Occult Bld: POSITIVE

## 2010-10-30 NOTE — H&P (Signed)
NAMEJAHNAE, MCADOO NO.:  [id number]  MEDICAL RECORD NO.:  [id number]  LOCATION:  5154                         FACILITY:  [facility]  PHYSICIAN:  Paula Compton, MD        DATE OF BIRTH:  1951/01/11  DATE OF ADMISSION:  10/27/2010 DATE OF DISCHARGE:                             HISTORY & PHYSICAL   PRIMARY CARE PROVIDER:  Doree Albee, MD  CHIEF COMPLAINT:  Abdominal pain.  HISTORY OF PRESENT ILLNESS:  Stacie Templin is a 60 year old female with a past medical history of pancreatitis in December 2011 of unknown origin, s/p cholecystectomy, chronic pain syndrome, hypertension, and diabetes mellitus type 2 presenting with worsening abdominal pain over the last 5 days.  The patient says that she was seen by her primary care provider 3 weeks ago for extremity pain and was started on Lyrica for suspected worsening fibromyalgia.  Since that time, she began to experience worsening abdominal pain, which acutely worsened over the last 5 days.  The patient describes the pain as a sharp and stabbing in her epigastric region, radiating to her back and at times, to her left flank.  She says the pain is almost constant and is worse after meals. The pain is similar to her pancreatitis in December, but slightly more intense.  She has began to eat primarily jello and broth.  She complains of some nausea, but no vomiting.  She reports she had fever to 100.4 on October 27, 2010, but otherwise has been afebrile.  She says her blood pressure has been poorly controlled during this time with systolic blood pressures over 161.  She denies alcohol use.  The patient denies bright red blood per rectum, hematemesis, or hemoptysis.  PAST MEDICAL HISTORY: 1. Diabetes mellitus type 2. 2. Depression with psychotic behavior. 3. Tobacco user. 4. Chronic pain syndrome. 5. Hypertension, benign essential. 6. GERD. 7. Chronic constipation. 8. Urinary incontinence. 9. Allergic  rhinitis. 10.Anemia.  PAST SURGICAL HISTORY: 1. Cholecystectomy. 2. Bilateral cataract extractions.  SOCIAL HISTORY:  The patient is widowed and disabled.  Smoking status; smokes 0.3 packs per day for 30 years.  Denies alcohol use, drug use, or sexual activity.  FAMILY HISTORY:  Daughter with CAD with history of an MI at age 20. Sister with diabetes mellitus.  ALLERGIES: 1. DESVENLAFAXINE, reaction, GI upset. 2. DULOXETINE, reaction, wheezing and nausea. 3. LITHIUM, rash.  HOME MEDICATIONS: 1. Albuterol 40 mcg inhaled 2 puffs p.r.n. q.6 h. 2. Zyrtec take 10 mg by mouth daily as needed. 3. Colace 100 mg, take 100 mg by mouth 2 times daily. 4. Prozac, take 40 mg by mouth daily. 5. Flonase 50 mcg 1 spray by nasal route. 6. Glipizide 5 mg take 1 tablet by mouth once daily. 7. Hydrochlorothiazide 25 mg by mouth every morning. 8. Lisinopril, take 40 mg by mouth daily. 9. Metoprolol tartrate 25 mg, take 2 tablets by mouth 2 times daily. 10.Nexium 40 mg, take 1 capsule by mouth every morning. 11.Nortriptyline 25 mg, take 1 capsule by mouth at bedtime. 12.Zofran 4 mg, take 1 tablet by mouth every 8 hours as needed. 13.Polyethylene glycol, take 17 g by mouth daily, mix with 4-6 ounces  of water. 14.Prednisolone acetate, take place 1 drop into both eyes 2 times     daily. 15.Lyrica 25 mg, take 1 capsule by mouth 3 times daily. 16.Seroquel, take 1 tablet 200 mg by mouth at bedtime.  REVIEW OF SYSTEMS:  Per HPI with the following additions; occasional headache and constipation.  Otherwise, 12 point review of systems was performed and was unremarkable.  PHYSICAL EXAMINATION:  VITAL SIGNS:  Pulse 101-107, blood pressure 188/99, respiratory rate 15-18, satting 100% on room air, temperature 99.4. GENERAL:  Alert, cooperative, in no distress. HEENT:  Pupils equal, reactive to light and accommodation.  Extraocular movements intact.  NECK:  Supple with midline trachea. HEART:  S1 and  S2 normal.  No murmur, rubs, or gallops.  Regular rate and rhythm. LUNGS:  Clear to auscultation.  No wheezes, rales, or rhonchi.  No labored breathing.   ABDOMEN:  Mild tenderness in the epigastrium.  No rebound tenderness.  No guarding or rigidity. Large ventral hernia noted. EXTREMITIES:  Extremities normal, atraumatic.  No cyanosis or edema. SKIN:  No rashes. NEUROLOGIC:  Normal without focal findings.  Mental status with normal findings.  Mental status, speech normal.  Alert and oriented x3.  LABS AND IMAGING:  BMP:  140, 3.3, 98, 28, 7.7, 0.92, 248.  CBC:  White count 8.3, hemoglobin 14.6, hematocrit 43, and platelets 200.  Lipase 158.  Urinalysis unremarkable.  CT of the chest, abdomen, and pelvis on October 28, 2010:  Midline ventral abdominal wall hernia containing transverse colon without proximal obstruction.  Small umbilical hernia containing fat.  Diffuse fatty infiltration of the liver.  No abscess or acute inflammatory process demonstrated.  ASSESSMENT AND PLAN:  Barb Shear is a 60 year old female with a history of pancreatitis presenting with abdominal pain and elevated lipase consistent with mild pancreatitis. 1. Pancreatitis:  The patient with elevated lipase and a CT scan with     unremarkable pancreatic changes.  I do not suspect the alcohol use. The patient is s/p cholecystectomy.  The patient with no     new recent medications except for Lyrica, which is not a class I to IV     drug associated with pancreatitis. She is on lisinopril and hctz which are class III drugs for pancreatitis but thesere have a very low risk. The patient smokes about a     third pack a day, which is a contributing factor for pancreatitis.     No recent history of trauma.  Hydrate the patient with normal     saline at 175 mL/hour.  Zofran p.r.n., Dilaudid p.r.n.  n.p.o.     except for meds and ice chips.  Consider advancing diet in the a.m.     Check lipids for hypertriglyceridemia, checks  CMET for     hypercalcemia.  Encourage smoking cessation. 2. Hypertension:  Continue metoprolol, hydrochlorothiazide, and     lisinopril.  Cover the patient with systolic blood pressure greater     than 200 with hydralazine 10 mg IV q.4 hours.  Suspect pain control     will also help with blood pressure management. 3. Diabetes:  Check CBGs and cover with Sensitive sliding scale     insulin.  Hold home glipizide. 4. Depression/sleep:  Continue Seroquel, nortriptyline, and Prozac. 5. Chronic pain, fibromyalgia:  Continue Lyrica. 6. Gastroesophageal reflux disease:  Continue home Protonix. 7. Hyperlipidemia:  We will check lipids. 8. Asthma, well controlled:  Continue albuterol p.r.n. 9. Glaucoma:  Continue Pred Forte, brimonidine, dorzolamide. 10.Full Code:  Need to readdress in the a.m. 11.Fluids, electrolytes, nutrition/gastrointestinal:  Normal saline     175 per hour, n.p.o. for now. 12.Prophylaxis:  We will start heparin subcu. 13.Disposition:  Pending clinical improvement.    ______________________________ Tana Conch, MD   ______________________________ Paula Compton, MD    SH/MEDQ  D:  10/28/2010  T:  10/28/2010  Job:  [id number]  Electronically Signed by Tana Conch MD on 10/28/2010 09:43:48 PM Electronically Signed by Paula Compton MD on 10/30/2010 11:59:05 AM

## 2010-10-31 LAB — GLUCOSE, CAPILLARY
Glucose-Capillary: 172 mg/dL — ABNORMAL HIGH (ref 70–99)
Glucose-Capillary: 164 mg/dL — ABNORMAL HIGH (ref 70–99)

## 2010-10-31 LAB — CBC
HCT: 33.6 % — ABNORMAL LOW (ref 36.0–46.0)
Hemoglobin: 10.9 g/dL — ABNORMAL LOW (ref 12.0–15.0)
MCH: 27.7 pg (ref 26.0–34.0)
MCHC: 32.4 g/dL (ref 30.0–36.0)
MCV: 85.3 fL (ref 78.0–100.0)
Platelets: 199 10*3/uL (ref 150–400)
RBC: 3.94 MIL/uL (ref 3.87–5.11)
RDW: 14.4 % (ref 11.5–15.5)
WBC: 5 10*3/uL (ref 4.0–10.5)

## 2010-11-07 ENCOUNTER — Encounter: Payer: Self-pay | Admitting: Family Medicine

## 2010-11-07 ENCOUNTER — Ambulatory Visit (INDEPENDENT_AMBULATORY_CARE_PROVIDER_SITE_OTHER): Payer: Commercial Managed Care - PPO | Admitting: Family Medicine

## 2010-11-07 VITALS — BP 173/89 | HR 83 | Temp 99.4°F | Wt 208.0 lb

## 2010-11-07 DIAGNOSIS — I10 Essential (primary) hypertension: Secondary | ICD-10-CM

## 2010-11-07 DIAGNOSIS — E1149 Type 2 diabetes mellitus with other diabetic neurological complication: Secondary | ICD-10-CM

## 2010-11-07 DIAGNOSIS — K861 Other chronic pancreatitis: Secondary | ICD-10-CM

## 2010-11-07 DIAGNOSIS — K5900 Constipation, unspecified: Secondary | ICD-10-CM

## 2010-11-07 DIAGNOSIS — K859 Acute pancreatitis without necrosis or infection, unspecified: Secondary | ICD-10-CM | POA: Insufficient documentation

## 2010-11-07 DIAGNOSIS — E1143 Type 2 diabetes mellitus with diabetic autonomic (poly)neuropathy: Secondary | ICD-10-CM

## 2010-11-07 DIAGNOSIS — K3184 Gastroparesis: Secondary | ICD-10-CM

## 2010-11-07 DIAGNOSIS — F17200 Nicotine dependence, unspecified, uncomplicated: Secondary | ICD-10-CM

## 2010-11-07 MED ORDER — ONDANSETRON HCL 4 MG PO TABS: 4.0000 mg | ORAL_TABLET | Freq: Three times a day (TID) | ORAL | Status: DC | PRN

## 2010-11-07 MED ORDER — AMLODIPINE BESYLATE 10 MG PO TABS: 10.0000 mg | ORAL_TABLET | Freq: Every day | ORAL | Status: DC

## 2010-11-07 MED ORDER — HYDROCORTISONE ACETATE 25 MG RE SUPP: 25.0000 mg | Freq: Two times a day (BID) | RECTAL | Status: AC | PRN

## 2010-11-07 MED ORDER — HYDROCORTISONE ACETATE 25 MG RE SUPP: 25.0000 mg | Freq: Two times a day (BID) | RECTAL | Status: DC | PRN

## 2010-11-07 NOTE — Assessment & Plan Note (Signed)
Patient continues to take Miralax. Despite this, she continues to strain from time to time and has recurrent hemorrhoids. The patient was given a prescription for rectal suppositories to help relieve pain from hemorrhoids as she says rectal cream does not stay in place long enough.

## 2010-11-07 NOTE — Progress Notes (Signed)
  Subjective:    Patient ID: [name] [name], female    DOB: 11-11-1950, 60 y.o.   MRN: [id number]  HPI Patient is a pleasant 60F with multiple medical problems including diabetic gastroparesis, recurrent pancreatitis (03/2010 and 10/2010) idiopathic except for tobacco abuse presenting for hospital follow up. The patient was recently admitted to [facility] from 7/8 to 7/12 with abdominal pain and nausea/vomiting consistent with recurrent pancreatitis and diabetic gastroparesis. THe patient says her pain is down to approximately a 5-6/10 down from 7/10 on discharge and 10/10 on admission. She does wake up approximately once a night with 8/10 abdominal pain. The pain is located in the epigastric region and radiates to her back. Her nausea has improved and only occurs approximately twice a day which is relieved by prn Zofran. She has advanced her diet past liquid clears and tries solid foods such as grapes and white bread with good results. She hasn't had any vomiting but has had some white spit up after her dinner meals. She says eating small frequent meals as instructed has helped both the pain and nausea. She is drinking fluids well. She says she has lost 9 pounds total since being hospitalized.   The patient also discussed that her blood pressure has been consistently greater than 160 systolic at home although she is asymptomatic with this. No Chest Pain or Shortness of Breath. She is concerned about this because her vision has been worsening and her ophthalmologist thinks this may be due to hypertension or diabetic retinopathy. She wants her pressures to improve to protect her vision.   The patient has been trying to quit smoking and has decreased from 5-6 per day to 3-4, although she says it is quite a struggle. She knows this can help prevent another occurrence of pancreatitis among other things.   Finally, the patient states that she has had occasional hemorrhoid pain and requests suppositories that she  had previously received from Dr. Earlene Plater.     Review of Systems see HPI   PMH - Smoking status noted.  Medical Problems reviewed.       Objective:   Physical Exam  Constitutional: She is oriented to person, place, and time. She appears well-nourished.  Eyes: EOM are normal. Pupils are equal, round, and reactive to light.  Neck: Neck supple.  Cardiovascular: Normal rate, regular rhythm and normal heart sounds.  Exam reveals no gallop and no friction rub.   No murmur heard. Pulmonary/Chest: No respiratory distress. She has no wheezes. She has no rales. She exhibits no tenderness.  Abdominal: Soft. Bowel sounds are normal. She exhibits no abdominal bruit and no pulsatile midline mass. There is tenderness in the epigastric area. There is no rigidity, no rebound and no guarding. A hernia is present. Hernia confirmed positive in the ventral area.  Musculoskeletal: Normal range of motion. She exhibits no edema.  Neurological: She is alert and oriented to person, place, and time.  Skin: Skin is warm and dry.          Assessment & Plan:  In addition to problem list, patient would like to follow up at next visit and talk about colonoscopy, breast exam, and potential surgery for large ventral hernia.

## 2010-11-07 NOTE — Assessment & Plan Note (Signed)
Patient has had increased tobacco use since visit in February 2012. She was up to 5-6 cigarettes per day but has been able to cut back to 3-4 per day since her hospitalization. She knows this could be an aggravating factor for pancreatitis. The patient was encouraged to quit for many of her health issues. Will continue to follow progress.

## 2010-11-07 NOTE — Patient Instructions (Signed)
Patient to continue to exercise and improve diet for goal of weight loss to help with blood pressure and diabetes.   For diabetic "slow gut"-Patient to continue to eat small frequent meals and slowly advance as tolerated. Patient is to take Zofran/anti-nausea medicine if she become nauseated.   For blood pressure-patient is to take new medicine Amlodipine 10mg  once daily.   For hemorrhoids-patient is given suppository as needed for hemorrhoid pain.   For Tobacco use-patient will continue to try to quit smoking.   Please schedule a follow up appointment within the next month with Dr. Alvester Morin if he is available. Will need to talk about colonoscopy/breast exam/possible surgery for hernia in next few visits. Patient will discuss with primary care Dr. Alvester Morin whether to begin insulin and stop glipizide at next appointment.

## 2010-11-07 NOTE — Assessment & Plan Note (Addendum)
Patient's pain is much improved from previous. She is slowly weaning herself off oxycodone prescribed at discharge. Smoking cessation was encouraged. The patient has a large ventral hernia but her pain is deep to the reducible hernia. A CT scan during her hospitalization did not show any incarceration, but she would like to potentially discuss surgery at her next visit. We will consider referral at that time.

## 2010-11-07 NOTE — Assessment & Plan Note (Signed)
Patient was encouraged first and foremost to make lifestyle changes including diet, exercise, and weight loss. The patient is exercising 3x a week on a home bike and trying to improve her diet. Considering the degree of elevation of blood pressure though, amlodipine 10mg  was added as an additional agent. Patient on 4 agents at this time including ace, beta blocker, hctz, and now ccb.

## 2010-11-07 NOTE — Assessment & Plan Note (Signed)
Patient will complete 2 week course of Reglan and continue to eat small frequent meals. Her nausea is much improved and she was given an additional prescription for Zofran to help with this. She is tolerating PO and slowly advancing her diet.

## 2010-11-08 NOTE — Discharge Summary (Signed)
[name], [name] NO.:  [id number]  MEDICAL RECORD NO.:  [id number]  LOCATION:  5154                         FACILITY:  [facility]  PHYSICIAN:  [name] [name], M.D.DATE OF BIRTH:  [birth year]  DATE OF ADMISSION:  10/27/2010 DATE OF DISCHARGE:  10/31/2010                              DISCHARGE SUMMARY   PRIMARY CARE PHYSICIAN:  [name] Albee, MD at [facility]  CONSULTANTS:  [facility], [phone].  DISCHARGE DIAGNOSES:  Primary: 1. Pancreatitis, acute, idiopathic. 2. Diabetic gastroparesis. Secondary: 1. Diabetes mellitus type 2. 2. Hypertension. 3. Gastroesophageal reflux disease. 4. Depression with psychotic behavior. 5. Tobacco abuse. 6. Chronic pain syndrome. 7. Fibromyalgia. 8. Glaucoma. 9. Chronic constipation. 10.Allergic rhinitis. 11.Anemia. 12.Status post cholecystectomy. 13.Status post bilateral cataract extractions.  DISCHARGE MEDICATIONS:  NEW MEDICATION: 1. Acetaminophen 325 mg, take 2 tablets by mouth every 6 hours. 2. Metoprolol 100 mg/Lopressor 100 mg by mouth twice daily. 3. Ondansetron 4 mg tablets every 6 hours as needed. 4. Oxycodone 5 mg IR tablets, take 1-2 for pain every 6 hours as     needed. 5. Reglan 5 mg, take 3 with meals and 1 before bedtime for 2 weeks for     diabetic gastroparesis.  CONTINUED MEDICATIONS: 1. Ambien 10 mg 1 tablet by mouth daily at bedtime as needed. 2. Anusol 2.5% one application topically every other day as needed for     hemorrhoids. 3. Aspirin 81 mg 1 tablet by mouth daily. 4. Brimonidine ophthalmic solution 1.5% 1 drop in both eyes twice     daily. 5. Cetirizine 10 mg 1 tablet by mouth daily. 6. Clear Eyes over the counter 1 drop in left eye daily. 7. Dorzolamide ophthalmic 2% 1 drop in both eyes twice daily. 8. Colace 100 mg 1 capsule by mouth twice daily. 9. Fluoxetine 40 mg 1 capsule by mouth twice daily. 10.Fluticasone nasal 50 mcg 1 spray both nostrils  daily. 11.Glipizide extended release 5 mg 1 tablet by mouth daily. 12.Glycerin rectal suppository 1 suppository rectally daily as needed     for hemorrhoids. 13.Hydrochlorothiazide 25 mg 1 tablet by mouth daily. 14.Ketorolac ophthalmic 0.4% 1 drop in both eyes twice daily. 15.Ketotifen eyedrops over the counter 1 drop in left eye daily for     allergies. 16.Lisinopril 40 mg 1 tablet by mouth daily. 17.MiraLax 17 g by mouth twice daily. 18.Nexium 40 mg 1 capsule by mouth daily. 19.Nortriptyline 25 mg 1 capsule by mouth daily. 20.Nystatin oral suspension 5 mL by mouth 4 times a day. 21.Nystatin topical ointment 100,000 units per gram 1 application     topically 3 times day. 22.Nystatin powder 1 application topically twice daily as needed. 23.Ocuvite, which is a beta-carotene with minerals 1 tablet by mouth     daily. 24.Pred Forte 1% 1 drop in both eyes 4 times a day. 25.Senokot over the counter 2 tablets by mouth twice daily. 26.Seroquel 200 mg half a tablet by mouth daily at bedtime. 27.Timolol ophthalmic solution 0.5% 1 drop in both eyes twice daily.  HOSPITAL COURSE:  [name] is a 60 year old female with a history of tobacco abuse,  status post cholecystectomy, with a history of pancreatitis in December  2011 presenting with nausea and vomiting for recurrent pancreatitis and diabetic gastroparesis. 1. Pancreatitis.  The full investigation did not reveal an underlying     cause other than smoking.  The patient was status post     cholecystectomy, did not have a history of alcohol use, and was without     hypertriglyceridemia or hypercalcemia.  She also had no new     medications for pancreatitis.  The patient was hydrated.  She was     given Zofran and Dilaudid initially for pain control over the     course of 3 days.  She slowly improved and was transitioned to p.o.     nausea and pain medications.  The patient's diet was slowly     advanced to puree by the time of  discharge.  The patient was     adequately able to keep down liquids, so she was deemed safe for     discharge.  The patient was strongly encouraged to quit smoking as     this is a contributing factor to pancreatitis. 2. Diabetic gastroparesis.  GI was consulted and recommended a gastric     emptying study, which showed delayed gastric emptying.  The results     may have been affected by the fact that the patient received     narcotics within 8 hours of the study.  It was decided to start the     patient on to 2-week trial of Reglan.  This improved the patient's     nausea.  No extrapyramidal side effects were noted during her stay.     GI also recommended a surgical consult for the patient's large     ventral hernia, but given that the patient's pain was due to the     hernia and CT showed no obstruction or incarceration, this consult     was withheld for outpatient followup.  A nutrition consult was also     obtained with a recommended diet including small frequent low-fat     meals. 3. Upper GI bleed.  There was a question of a GI bleed.  The patient described one stool possibly with melena on the day     of admission.  A fecal occult blood test was positive.  The patient     was continued on 40 mg of IV Protonix b.i.d. during her stay.  Her     hemoglobin remained stable throughout her stay.  Outpatient     followup would require a colonoscopy is she has not had one in 10     years. 4. Hypertension.  The patient was continued on home medications.  Her     blood pressures were initially poorly controlled.  The patient's     metoprolol was increased to 100mg  b.i.d. from Metoprolol 50mg  b.i.d. 5. Depression/sleep.  The patient was continued on home medications. 6. Asthma, continued on home albuterol. 7. Glaucoma, continued on home medications. 8. GERD.  The patient was treated with 40 mg Protonix b.i.d. IV for     possible upper GI bleed.  Before discharge, she was transitioned back  to     home dose of PPI.  PROCEDURES:  1.  Gastric emptying study on October 29, 2010, showed normal gastric emptying exam with 83% retention at 120 minutes.  2.  CT scan of chest, abdomen, and pelvis on October 28, 2010, showed midline ventral abdominal wall hernia containing transverse colon without proximal obstruction.  Small umbilical hernia containing fat.  Diffuse fatty infiltration of the liver.  No abscess or acute inflammatory process is demonstrated.  LABORATORY DATA AT DISCHARGE:  CBC, white count 5.9, hemoglobin 10.9, stable from previous, platelets 199, hematocrit 33.6.  BMP, 139, 3.6, 104, 23, 7, 173, total bilirubin 0.3, alk phos 98, AST 79, ALT 77, total protein 6.8, albumin 3.3, calcium 8.6.  OTHER PERTINENT LABORATORY DATA:  Hemoglobin A1c of 8.2.  Initial lipase 158 with repeat of 90.  Lipid profile, cholesterol 162, triglycerides 135, HDL 53, LDL 82, DLDL 27.  The patient's condition at the time of discharge stable.  The patient should return if her pain worsens or if she has a temperature greater than 100.5.  DISPOSITION:  Home.  DISCHARGE FOLLOWUP:  The patient with followup appointment with Dr. Tana Conch at the [facility] on November 07, 2010, at 3:30.  FOLLOWUP ISSUES: 1. Pain control. 2. Persistence of nausea and vomiting. 3. Diet, small frequent meals. Low fat.  4. Tobacco cessation. 5. Colonoscopy f/u if needed.  6. Consider followup for fatty liver given increased transaminases and     signs of fatty liver on CT scan.    ______________________________ Tana Conch, MD   ______________________________ [name] [name], M.D.    SH/MEDQ  D:  10/31/2010  T:  11/01/2010  Job:  [id number]  cc:   [name] Albee, MD  Electronically Signed by Tana Conch MD on 11/02/2010 11:32:03 PM Electronically Signed by Acquanetta Belling M.D. on 11/08/2010 04:43:53 PM

## 2010-12-02 ENCOUNTER — Encounter: Payer: Self-pay | Admitting: Family Medicine

## 2010-12-05 ENCOUNTER — Ambulatory Visit (INDEPENDENT_AMBULATORY_CARE_PROVIDER_SITE_OTHER): Payer: 59 | Admitting: Family Medicine

## 2010-12-05 ENCOUNTER — Telehealth: Payer: Self-pay | Admitting: Family Medicine

## 2010-12-05 DIAGNOSIS — E1143 Type 2 diabetes mellitus with diabetic autonomic (poly)neuropathy: Secondary | ICD-10-CM

## 2010-12-05 DIAGNOSIS — K439 Ventral hernia without obstruction or gangrene: Secondary | ICD-10-CM

## 2010-12-05 DIAGNOSIS — K3184 Gastroparesis: Secondary | ICD-10-CM

## 2010-12-05 DIAGNOSIS — Z720 Tobacco use: Secondary | ICD-10-CM

## 2010-12-05 DIAGNOSIS — F17200 Nicotine dependence, unspecified, uncomplicated: Secondary | ICD-10-CM

## 2010-12-05 DIAGNOSIS — K859 Acute pancreatitis without necrosis or infection, unspecified: Secondary | ICD-10-CM

## 2010-12-05 DIAGNOSIS — I10 Essential (primary) hypertension: Secondary | ICD-10-CM

## 2010-12-05 DIAGNOSIS — K861 Other chronic pancreatitis: Secondary | ICD-10-CM

## 2010-12-05 DIAGNOSIS — E119 Type 2 diabetes mellitus without complications: Secondary | ICD-10-CM

## 2010-12-05 DIAGNOSIS — E1149 Type 2 diabetes mellitus with other diabetic neurological complication: Secondary | ICD-10-CM

## 2010-12-05 LAB — COMPREHENSIVE METABOLIC PANEL
ALT: 17 U/L (ref 0–35)
AST: 22 U/L (ref 0–37)
Albumin: 4.3 g/dL (ref 3.5–5.2)
Alkaline Phosphatase: 93 U/L (ref 39–117)
BUN: 13 mg/dL (ref 6–23)
CO2: 29 mEq/L (ref 19–32)
Calcium: 9.4 mg/dL (ref 8.4–10.5)
Chloride: 102 mEq/L (ref 96–112)
Creat: 0.74 mg/dL (ref 0.50–1.10)
Glucose, Bld: 199 mg/dL — ABNORMAL HIGH (ref 70–99)
Potassium: 4.1 mEq/L (ref 3.5–5.3)
Sodium: 143 mEq/L (ref 135–145)
Total Bilirubin: 0.4 mg/dL (ref 0.3–1.2)
Total Protein: 7.3 g/dL (ref 6.0–8.3)

## 2010-12-05 LAB — CBC
HCT: 40.8 % (ref 36.0–46.0)
Hemoglobin: 13 g/dL (ref 12.0–15.0)
MCH: 28.1 pg (ref 26.0–34.0)
MCHC: 31.9 g/dL (ref 30.0–36.0)
MCV: 88.1 fL (ref 78.0–100.0)
Platelets: 188 10*3/uL (ref 150–400)
RBC: 4.63 MIL/uL (ref 3.87–5.11)
RDW: 15.1 % (ref 11.5–15.5)
WBC: 6.2 10*3/uL (ref 4.0–10.5)

## 2010-12-05 MED ORDER — NICOTINE 7 MG/24HR TD PT24: 1.0000 | MEDICATED_PATCH | TRANSDERMAL | Status: AC

## 2010-12-05 NOTE — Telephone Encounter (Signed)
Patient dropped off disability forms to be signed by MD, pt also says when she comes back to pick them up, she wants to see if RN or MD can help her fill them out? Placed in K Foster's office for any clinical completion.

## 2010-12-05 NOTE — Patient Instructions (Signed)
It was good to meet you today  I am referring you to the surgeon for your hernia  For your stomach, continue using the reglan as prescribed I would like to switch you to a low dose of long acting insulin after your receive diabetic teaching.  I am setting you up for diabetic teaching; continue the glipizide until then.   I am starting you on the nicotine patch. Use this as prescribed.  I am checking some lab work today  Please follow up with me in 2 weeks for any other issues that we could not talk about today  Call with any other questions,  Doree Albee MD

## 2010-12-06 NOTE — Telephone Encounter (Signed)
Placed in Dr. Eckley Blas box for completion.

## 2010-12-08 ENCOUNTER — Encounter: Payer: Self-pay | Admitting: Family Medicine

## 2010-12-08 DIAGNOSIS — K439 Ventral hernia without obstruction or gangrene: Secondary | ICD-10-CM | POA: Insufficient documentation

## 2010-12-08 MED ORDER — ESOMEPRAZOLE MAGNESIUM 40 MG PO CPDR: 40.0000 mg | DELAYED_RELEASE_CAPSULE | Freq: Every day | ORAL | Status: DC

## 2010-12-08 NOTE — Assessment & Plan Note (Signed)
Stable. Will continue on current regimen.

## 2010-12-08 NOTE — Assessment & Plan Note (Signed)
Instructed pt to continue with reglan. Pt seems to be tolerating well. Is taking with meals.

## 2010-12-08 NOTE — Progress Notes (Signed)
  Subjective:    Patient ID: [name] [name], female    DOB: 1950-08-16, 60 y.o.   MRN: [id number]  HPI Pt is here for chronic problem follow up with recent admission for pancreatitis of somewhat unknown etiology (?smoking).  Pt states that her abdominal pain has stable since last clinical visit. No nausea, vomiting, diarrhea. Pt has been able to tolerate modified diet including bread, milk, pudding with minimal abdominal pain. Pt is taking reglan with meals. This has helped with sxs as well per pt. Pt has had intermittent abdominal pain that pt feels may be contributed by ventral/umbilical hernia. Pt denies any diarrhea or difficulty with bowel movements.  Pt is still smoking. Pt states that nicotine patches while in hospital were very helpful, and is willing to try for smoking cessation.  HTN: Pt has tolerated recent addition of norvasc to BP regimen BPs in 140s-150s at home. No HA, CP, SOB.   DM: Most recent A1C 8.2 (10/2010). Blood sugars mainly in 140s-160s at home. Is currently on amaryl and glipizide.    Review of Systems See HPI    Objective:   Physical Exam Gen: up in chair, NAD, obese HEENT: NCAT, EOMI, TMs clear bilaterally CV: RRR, no murmurs auscultated PULM: CTAB, no wheezes, rales, rhoncii ABD: obese, + mild epigastric tenderness, + ventral hernia, reducible-non tender,  EXT: 2+ peripheral pulses   Assessment & Plan:

## 2010-12-08 NOTE — Assessment & Plan Note (Signed)
Will place on nicotine patch. Will continue to follow.

## 2010-12-08 NOTE — Assessment & Plan Note (Signed)
Non-obstructed by most recent CT Abd. Non tender today. This has been a recurrent issue for the pt.  Unsure if this is complicating current issue of recurrecnt pancreatitis/abdominal pain; though CT and physical exams reassuring. Will refer pt to general surgery for further management.

## 2010-12-08 NOTE — Assessment & Plan Note (Signed)
Relatively stable on current diet. Advised pt to continue as tolerated. Felt to be related to smoking. Hopefully nicotine patches may help with this. Will continue to follow clinically.

## 2010-12-08 NOTE — Assessment & Plan Note (Addendum)
CBGs mildly elevated though pt asymptomatic. Unsure if secreatigogs would benefit in setting of recurrent pancreatitis. Plan to place pt on low dose lantus pending diabetic teaching. Will continue with glipizide in interim.

## 2010-12-10 ENCOUNTER — Telehealth: Payer: Self-pay | Admitting: *Deleted

## 2010-12-10 NOTE — Telephone Encounter (Signed)
Called and spoke with patient and she said she will call back when she is able to write down the appointment. She is scheduled with [facility] on 12/19/2010 at 9:30 am. If unable to keep appointment she needs to call and reschedule, [phone].Busick, [name]

## 2010-12-10 NOTE — Telephone Encounter (Signed)
Pt called back and info was given

## 2010-12-12 ENCOUNTER — Encounter: Payer: Self-pay | Admitting: Family Medicine

## 2010-12-12 NOTE — Telephone Encounter (Signed)
Patient notified that disability form is ready to pick up.

## 2010-12-13 ENCOUNTER — Encounter (INDEPENDENT_AMBULATORY_CARE_PROVIDER_SITE_OTHER): Payer: Self-pay | Admitting: Surgery

## 2010-12-16 ENCOUNTER — Other Ambulatory Visit: Payer: Self-pay | Admitting: Family Medicine

## 2010-12-16 NOTE — Telephone Encounter (Signed)
Refill request

## 2010-12-17 ENCOUNTER — Encounter (INDEPENDENT_AMBULATORY_CARE_PROVIDER_SITE_OTHER): Payer: Self-pay | Admitting: Surgery

## 2010-12-19 ENCOUNTER — Encounter (INDEPENDENT_AMBULATORY_CARE_PROVIDER_SITE_OTHER): Payer: Self-pay | Admitting: Surgery

## 2010-12-19 ENCOUNTER — Ambulatory Visit (INDEPENDENT_AMBULATORY_CARE_PROVIDER_SITE_OTHER): Payer: 59 | Admitting: Surgery

## 2010-12-19 VITALS — BP 158/82 | HR 58 | Temp 96.7°F | Ht 64.0 in | Wt 205.2 lb

## 2010-12-19 DIAGNOSIS — R109 Unspecified abdominal pain: Secondary | ICD-10-CM | POA: Insufficient documentation

## 2010-12-19 DIAGNOSIS — K429 Umbilical hernia without obstruction or gangrene: Secondary | ICD-10-CM

## 2010-12-19 DIAGNOSIS — K439 Ventral hernia without obstruction or gangrene: Secondary | ICD-10-CM

## 2010-12-19 NOTE — Progress Notes (Signed)
Chief Complaint  Patient presents with  . Incisional Hernia    HPI [name] [name] is a 60 y.o. female.  HPI This patient is referred from come in family practice for evaluation of ventral hernias. The patient had a laparoscopic cholecystectomy in 1999. 2 years ago she was at work when a large heavy sink fell on her abdomen. She had to pull herself out from underneath the large sink. She has been disabled for the last 2 years due to the back injury that occurred during that accident. She has also developed a large lump in her upper abdomen. She also complains of frequent nausea. She has been found to have diabetic gastroparesis. A CT scan obtained in July showed a large upper midline ventral hernia containing transverse colon. She also has a smaller umbilical hernia. She has had long-standing problems with constipation requiring the use of laxatives for bowel movements. She was hospitalized in July for acute idiopathic pancreatitis she presents now for evaluation for hernia repair Past Medical History  Diagnosis Date  . Constipation   . Chronic pain syndrome   . Depression   . Diabetes mellitus   . GERD (gastroesophageal reflux disease)   . Hyperlipidemia   . Hypertension   . Allergy   . Cataract   . Urinary incontinence   . Fibromyalgia   . Anemia   . Pancreatitis   . Arthritis   . Asthma   . Heart murmur   . Stroke   . Fever chills   . Hearing loss     left side  . Nasal congestion   . Sore throat   . Visual disturbance   . Cough   . Abdominal distention   . Abdominal pain   . Rectal bleeding   . Nausea & vomiting   . Leg swelling     little blisters   . Difficulty urinating   . Headaches, cluster     Past Surgical History  Procedure Date  . Hernia repair   . Cholecystectomy   . Abdominal hysterectomy   . Dental surgery   . Eye surgery   . Colonoscopy     Family History  Problem Relation Age of Onset  . Heart disease Mother   . Diabetes Mother   . Stroke  Mother     Social History History  Substance Use Topics  . Smoking status: Current Some Day Smoker -- 0.3 packs/day for 30 years    Types: Cigarettes  . Smokeless tobacco: Current User   Comment: started at age 71 - quit for several years.  Restarted with death of child.   . Alcohol Use: No    Allergies  Allergen Reactions  . Desvenlafaxine     REACTION: GI upset  . Duloxetine     REACTION: "wheezing" and nausea  . Latex   . Lithium Rash    Current Outpatient Prescriptions  Medication Sig Dispense Refill  . albuterol (PROAIR HFA) 108 (90 BASE) MCG/ACT inhaler Inhale 2 puffs into the lungs every 6 (six) hours as needed for wheezing.  1 Inhaler  1  . amLODipine (NORVASC) 10 MG tablet Take 1 tablet (10 mg total) by mouth daily.  30 tablet  11  . aspirin 81 MG tablet Take 81 mg by mouth daily.       . cetirizine (ZYRTEC) 10 MG tablet Take 10 mg by mouth daily as needed.        . docusate sodium (COLACE) 100 MG capsule Take 100 mg by  mouth 2 (two) times daily.        Marland Kitchen esomeprazole (NEXIUM) 40 MG capsule Take 1 capsule (40 mg total) by mouth daily before breakfast.  30 capsule  6  . FLUoxetine (PROZAC) 40 MG capsule Take 40 mg by mouth daily.        . fluticasone (FLONASE) 50 MCG/ACT nasal spray 1 spray by Nasal route daily.  16 g  2  . glipiZIDE (GLUCOTROL) 5 MG 24 hr tablet TAKE 1 TABLET BY MOUTH ONCE DAILY  30 tablet  PRN  . glucose blood (ONE TOUCH TEST STRIPS) test strip Check blood sugar twice daily. Dispense QS       . hydrochlorothiazide 25 MG tablet Take 25 mg by mouth every morning.        . hydrocortisone (ANUSOL-HC) 2.5 % rectal cream Place rectally as directed. Per box instructions       . hydrocortisone 1 % cream Apply to affected area 2 times daily  30 g  1  . HYDROmorphone (DILAUDID) 3 MG suppository Place 3 mg rectally every 6 (six) hours as needed.        . Hypromellose (ARTIFICIAL TEARS OP) 1 drop in each eye as needed for dry eye       . lisinopril  (PRINIVIL,ZESTRIL) 40 MG tablet Take 40 mg by mouth daily.        . metoCLOPramide (REGLAN) 5 MG tablet TAKE 1 TABLET BY MOUTH FOUR TIMES DAILY  60 tablet  0  . metoprolol tartrate (LOPRESSOR) 25 MG tablet Take 100 mg by mouth 2 (two) times daily.       . nicotine (NICODERM CQ - DOSED IN MG/24 HR) 7 mg/24hr patch Place 1 patch onto the skin daily.  30 patch  0  . nortriptyline (PAMELOR) 25 MG capsule Take 1 capsule (25 mg total) by mouth at bedtime.  30 capsule  2  . nystatin (MYCOSTATIN) cream Apply topically 2 (two) times daily as needed.        . ondansetron (ZOFRAN) 4 MG tablet Take 1 tablet (4 mg total) by mouth every 8 (eight) hours as needed.  20 tablet  0  . ONETOUCH DELICA LANCETS MISC Test blood sugar 2 times daily. Dispense QS       . polyethylene glycol (GLYCOLAX) packet Take 17 g by mouth daily. Mix in 4-6 oz of water      . prednisoLONE acetate (PRED FORTE) 1 % ophthalmic suspension Place 1 drop into both eyes 2 (two) times daily.        . pregabalin (LYRICA) 25 MG capsule Take 1 capsule (25 mg total) by mouth 3 (three) times daily.  90 capsule  2  . QUEtiapine (SEROQUEL) 200 MG tablet Take 1 tablet (200 mg total) by mouth at bedtime.  30 tablet  1    Review of Systems Review of Systems Positive for chills, hearing loss, nasal congestion, sore throat, visual disturbance, cough, leg swelling, abdominal distention and pain, hemorrhoids, nausea, vomiting, headaches, Blood pressure 158/82, pulse 58, temperature 96.7 F (35.9 C), height 5\' 4"  (1.626 m), weight 205 lb 4 oz (93.101 kg).  Physical Exam Physical Exam WDWN in NAD HEENT:  EOMI, sclera anicteric Neck:  No masses, no thyromegaly Lungs:  CTA bilaterally; normal respiratory effort CV:  Regular rate and rhythm; no murmurs Abd:  +bowel sounds, soft, large upper midline mass, palpable umbilical hernia Ext:  Well-perfused; no edema Skin:  Warm, dry; no sign of jaundice  Data Reviewed CT scans -  July 2012 - midline ventral/  umbilical hernias  Assessment    Ventral/ umbilical hernias  Multiple medical comorbidities    Plan    Laparoscopic ventral hernia repair with mesh.The surgical procedure has been discussed with the patient.  Potential risks, benefits, alternative treatments, and expected outcomes have been explained.  All of the patient's questions at this time have been answered.  The patient understand the proposed surgical procedure and wishes to proceed.  I also recommended that she speak to her primary physician about a GI referral for her pancreatitis and her diabetic gastroparesis.       TSUEI,MATTHEW K. 12/19/2010, 10:49 AM

## 2010-12-19 NOTE — Patient Instructions (Signed)
We will schedule your laparoscopic ventral hernia repair

## 2010-12-24 ENCOUNTER — Ambulatory Visit (INDEPENDENT_AMBULATORY_CARE_PROVIDER_SITE_OTHER): Payer: 59 | Admitting: Pharmacist

## 2010-12-24 ENCOUNTER — Encounter: Payer: Self-pay | Admitting: Pharmacist

## 2010-12-24 VITALS — BP 155/83 | HR 103 | Ht 64.0 in | Wt 212.2 lb

## 2010-12-24 DIAGNOSIS — F17200 Nicotine dependence, unspecified, uncomplicated: Secondary | ICD-10-CM

## 2010-12-24 DIAGNOSIS — E119 Type 2 diabetes mellitus without complications: Secondary | ICD-10-CM

## 2010-12-24 MED ORDER — INSULIN GLARGINE 100 UNIT/ML ~~LOC~~ SOLN: 5.0000 [IU] | SUBCUTANEOUS | Status: DC

## 2010-12-24 MED ORDER — INSULIN PEN NEEDLE 29G X 12.7MM MISC: Status: DC

## 2010-12-24 NOTE — Progress Notes (Signed)
  Subjective:    Patient ID: [name], female    DOB: [birth year], 60 y.o.   MRN: [id number]  HPI Ms. Vaillancourt is a 60YOF who presents to clinic for instructions for starting insulin. Her blood glucose meter is broken so she has not been checking her sugars lately. A review of her med list showed that he uses ASA 500mg  1-2 tablets 5 days/week for pain. She uses her albuterol inhaler 3 x/week when it is warn and humid. She uses her fluticasone nasal spray 2 sprays in each nare 3x/day, metoprolol tartrate 25mg  1-2 x/day, pregabalin 3-4 x/day, and quetiapine 100mg  qhs. She has been using 1 1/2 doses of miralax each day.     Review of Systems     Objective:   Physical Exam        Assessment & Plan:   Diabetes of many yrs duration currently under fair control of blood glucose based on  . Lab Results  Component Value Date   HGBA1C 8.2* 10/28/2010     ,home fasting CBG readings of "over 100 and some 200s" - She states that her Relion meter has been broken but she plans to obtain a new one in the next few days. Control is suboptimal due to limited activity. Denies hypoglycemic events.  Able to verbalize appropriate hypoglycemia management plan. Started basal insulin Lantus (insulin glargine). Patient injected 5 units of insulin in office today. Patient will continue to titrate 1 unit/day if fasting CBGs > 100mg /dl until fasting CBGs reach goal or next visit.  Written patient instructions provided.  Follow up in  Pharmacist Clinic Visit after next visit with Dr. Alvester Morin .   TTFFC 50 minutes.  Patient seen with Tomi Bamberger, PharmD Candidate and Benjaman Pott, Pharmacy Resident.  moderate Nicotine Dependence of many years duration in a patient who is fair candidate for success b/c of numerous stressors in life.    Continue nicotine replacement tx including how to use gum Patient counseled on purpose, proper use, and potential adverse effects, including   Written information provided.  Patient will try  to smoke 8 or less cigs per day during the next week.

## 2010-12-24 NOTE — Assessment & Plan Note (Signed)
Diabetes of many yrs duration currently under fair control of blood glucose based on  . Lab Results  Component Value Date   HGBA1C 8.2* 10/28/2010     ,home fasting CBG readings of "over 100 and some 200s" - She states that her Relion meter has been broken but she plans to obtain a new one in the next few days. Control is suboptimal due to limited activity. Denies hypoglycemic events.  Able to verbalize appropriate hypoglycemia management plan. Started basal insulin Lantus (insulin glargine). Patient injected 5 units of insulin in office today. Patient will continue to titrate 1 unit/day if fasting CBGs > 100mg /dl until fasting CBGs reach goal or next visit.  Written patient instructions provided.  Follow up in  Pharmacist Clinic Visit after next visit with Dr. Alvester Morin .   TTFFC 50 minutes.  Patient seen with Tomi Bamberger, PharmD Candidate and Benjaman Pott, Pharmacy Resident.

## 2010-12-24 NOTE — Assessment & Plan Note (Signed)
moderate Nicotine Dependence of many years duration in a patient who is fair candidate for success b/c of numerous stressors in life.    Continue nicotine replacement tx including how to use gum Patient counseled on purpose, proper use, and potential adverse effects, including   Written information provided.  Patient will try to smoke 8 or less cigs per day during the next week.

## 2010-12-24 NOTE — Progress Notes (Signed)
  Subjective:    Patient ID: [name] [name], female    DOB: November 09, 1950, 60 y.o.   MRN: [id number]  HPI Reviewed and agree with Dr. Macky Lower management.    Review of Systems     Objective:   Physical Exam        Assessment & Plan:

## 2010-12-24 NOTE — Patient Instructions (Signed)
Buy or Fix your Relion meter at [employer].   Check your blood glucose reading each morning.  IF your blood sugar is 80-100 you can continue with the same dose as previously.  IF your blood sugar is higher than 100 INCREASE you doe of Lantus pen by 1 unit each day.   Tomorrow, Wed Sept 5th, if your blood sugar is higher than 100 please take 6 units.   Let's try to smoke 4 cigarettes per day this week.   Next visit with Dr. Alvester Morin - on Friday.

## 2010-12-27 ENCOUNTER — Ambulatory Visit: Payer: 59 | Admitting: Family Medicine

## 2011-01-09 ENCOUNTER — Ambulatory Visit: Payer: 59 | Admitting: Family Medicine

## 2011-01-13 ENCOUNTER — Other Ambulatory Visit (INDEPENDENT_AMBULATORY_CARE_PROVIDER_SITE_OTHER): Payer: Self-pay | Admitting: Surgery

## 2011-01-13 ENCOUNTER — Encounter (HOSPITAL_COMMUNITY)
Admission: RE | Admit: 2011-01-13 | Discharge: 2011-01-13 | Disposition: A | Discharge status: Home / Self Care | Payer: 59 | Source: Ambulatory Visit | Attending: Surgery | Admitting: Surgery

## 2011-01-13 ENCOUNTER — Ambulatory Visit (HOSPITAL_COMMUNITY)
Admission: RE | Admit: 2011-01-13 | Discharge: 2011-01-13 | Disposition: A | Discharge status: Home / Self Care | Payer: 59 | Source: Ambulatory Visit | Attending: Surgery | Admitting: Surgery

## 2011-01-13 DIAGNOSIS — Z01811 Encounter for preprocedural respiratory examination: Secondary | ICD-10-CM | POA: Insufficient documentation

## 2011-01-13 DIAGNOSIS — R52 Pain, unspecified: Secondary | ICD-10-CM

## 2011-01-13 DIAGNOSIS — I10 Essential (primary) hypertension: Secondary | ICD-10-CM | POA: Insufficient documentation

## 2011-01-13 DIAGNOSIS — Z01812 Encounter for preprocedural laboratory examination: Secondary | ICD-10-CM | POA: Insufficient documentation

## 2011-01-13 LAB — COMPREHENSIVE METABOLIC PANEL
ALT: 17 U/L (ref 0–35)
AST: 18 U/L (ref 0–37)
Albumin: 3.9 g/dL (ref 3.5–5.2)
Alkaline Phosphatase: 113 U/L (ref 39–117)
BUN: 10 mg/dL (ref 6–23)
CO2: 32 mEq/L (ref 19–32)
Calcium: 9.6 mg/dL (ref 8.4–10.5)
Chloride: 100 mEq/L (ref 96–112)
Creatinine, Ser: 0.69 mg/dL (ref 0.50–1.10)
GFR calc Af Amer: >60 mL/min (ref >60)
GFR calc non Af Amer: >60 mL/min (ref >60)
Glucose, Bld: 218 mg/dL — ABNORMAL HIGH (ref 70–99)
Potassium: 4.9 mEq/L (ref 3.5–5.1)
Sodium: 141 mEq/L (ref 135–145)
Total Bilirubin: 0.3 mg/dL (ref 0.3–1.2)
Total Protein: 7.7 g/dL (ref 6.0–8.3)

## 2011-01-13 LAB — CBC
HCT: 40.1 % (ref 36.0–46.0)
Hemoglobin: 13.2 g/dL (ref 12.0–15.0)
MCH: 28.4 pg (ref 26.0–34.0)
MCHC: 32.9 g/dL (ref 30.0–36.0)
MCV: 86.4 fL (ref 78.0–100.0)
Platelets: 179 10*3/uL (ref 150–400)
RBC: 4.64 MIL/uL (ref 3.87–5.11)
RDW: 13.8 % (ref 11.5–15.5)
WBC: 7 10*3/uL (ref 4.0–10.5)

## 2011-01-13 LAB — DIFFERENTIAL
Basophils Absolute: 0.1 10*3/uL (ref 0.0–0.1)
Basophils Relative: 1 % (ref 0–1)
Eosinophils Absolute: 0.1 10*3/uL (ref 0.0–0.7)
Eosinophils Relative: 2 % (ref 0–5)
Lymphocytes Relative: 46 % (ref 12–46)
Lymphs Abs: 3.2 10*3/uL (ref 0.7–4.0)
Monocytes Absolute: 0.3 10*3/uL (ref 0.1–1.0)
Monocytes Relative: 5 % (ref 3–12)
Neutro Abs: 3.2 10*3/uL (ref 1.7–7.7)
Neutrophils Relative %: 46 % (ref 43–77)

## 2011-01-13 LAB — SURGICAL PCR SCREEN
MRSA, PCR: NEGATIVE
Staphylococcus aureus: NEGATIVE

## 2011-01-13 NOTE — Progress Notes (Signed)
Quick Note:  This patient may proceed with surgery ______ 

## 2011-01-14 ENCOUNTER — Telehealth: Payer: Self-pay | Admitting: Family Medicine

## 2011-01-14 NOTE — Telephone Encounter (Signed)
Called and spoke with pt's granddaughter. Informed of appt tomorrow with Dr.Newton. She will informed pt. Tried to call pt at home. No answer. Lorenda Hatchet, [name]

## 2011-01-14 NOTE — Telephone Encounter (Signed)
Continuing Accident [employer] Claim Form placed in Dr. Gays Blas box for completion.  Patient has an appointment to see him on 01/15/2011.  [name] [name]

## 2011-01-14 NOTE — Telephone Encounter (Signed)
Patient dropped off accident claim form to be completed, given to Arita Miss for any clinical completion.

## 2011-01-14 NOTE — Telephone Encounter (Signed)
Short Stay called after reviewing her chart for Hernia Repairs.  Calling to schedule appt for patient to be checked out before her surgery.  He thought this would be a good idea to determine if she will need a stress test before surgery.  She is scheduled to see Dr. Alvester Morin 9/26 @10 :15, and he is calling the patient to make her aware of this appt.

## 2011-01-15 ENCOUNTER — Other Ambulatory Visit: Payer: Self-pay

## 2011-01-15 ENCOUNTER — Encounter: Payer: Self-pay | Admitting: Family Medicine

## 2011-01-15 ENCOUNTER — Telehealth: Payer: Self-pay | Admitting: Family Medicine

## 2011-01-15 ENCOUNTER — Ambulatory Visit (HOSPITAL_COMMUNITY)
Admission: RE | Admit: 2011-01-15 | Discharge: 2011-01-15 | Disposition: A | Discharge status: Home / Self Care | Payer: 59 | Source: Ambulatory Visit | Attending: Family Medicine | Admitting: Family Medicine

## 2011-01-15 ENCOUNTER — Ambulatory Visit (INDEPENDENT_AMBULATORY_CARE_PROVIDER_SITE_OTHER): Payer: 59 | Admitting: Family Medicine

## 2011-01-15 VITALS — BP 176/85 | HR 123 | Wt 212.0 lb

## 2011-01-15 DIAGNOSIS — Z01810 Encounter for preprocedural cardiovascular examination: Secondary | ICD-10-CM | POA: Insufficient documentation

## 2011-01-15 DIAGNOSIS — I10 Essential (primary) hypertension: Secondary | ICD-10-CM

## 2011-01-15 DIAGNOSIS — R9431 Abnormal electrocardiogram [ECG] [EKG]: Secondary | ICD-10-CM | POA: Insufficient documentation

## 2011-01-15 DIAGNOSIS — R002 Palpitations: Secondary | ICD-10-CM | POA: Insufficient documentation

## 2011-01-15 DIAGNOSIS — E119 Type 2 diabetes mellitus without complications: Secondary | ICD-10-CM

## 2011-01-15 LAB — TSH: TSH: 1.093 u[IU]/mL (ref 0.350–4.500)

## 2011-01-15 MED ORDER — LISINOPRIL 40 MG PO TABS: 40.0000 mg | ORAL_TABLET | Freq: Every day | ORAL | Status: DC

## 2011-01-15 MED ORDER — METOPROLOL TARTRATE 50 MG PO TABS: 150.0000 mg | ORAL_TABLET | Freq: Two times a day (BID) | ORAL | Status: DC

## 2011-01-15 NOTE — Assessment & Plan Note (Addendum)
Framingham risk assessment score @ 17. Will refer pt to cardiology for dobutamine stress test. Instructed to continue daily asa. Pt already on beta blocker.  Contacted Surgical PA. Will hold on surgery until stress test is performed. Pt agreeable to plan.

## 2011-01-15 NOTE — Patient Instructions (Addendum)
It was good to see today. For your diabetes, increase your  Lantus to 15 units daily. Continue  to check her blood sugars at least 3 times a day. Try to decrease her carbohydrate intake.  For your blood pressure, I am increasing your Lopressor 150 mg twice daily. Use this as prescribed. I am checking  an EKG today I am referring you  to the heart doctors for a stress test for your heart I think we need to do this before any surgeries done. Once this is done, we can set you up for surgery. We will hold off on surgery until this point. Please come in later on this week for a vital sign check. Come back to see me one to 2 weeks for followup visit. Please call with any questions. God Bless,  Doree Albee MD

## 2011-01-15 NOTE — Progress Notes (Signed)
  Subjective:    Patient ID: [name] [name], female    DOB: 1950-09-01, 60 y.o.   MRN: [id number]  HPI Patient is here for preoperative evaluation:  Patient is pending ventral hernia repair with [place] surgery.  Patient is known to have multiple cardiovascular risk factors including diabetes, tobacco abuse, hypertension, and obesity. Both diabetes and hypertension have been relatively uncontrolled. Patient also has a baseline history of psychiatric disease including depression with psychotic behavior.  Of note patient was also recently admitted for recurrent pancreatitis.  Patient states that she has never had a cardiac catheterization or any type of stress testing done to her heart in the past.  Hypertension: Patient states that her blood pressures have been running in the 170s over 80s chronically. Patient states that she has been compliant with her medications including Lopressor 100 twice a day, lisinopril 40 mg daily, although patient states that she ran out of her lisinopril approximately one week ago. Patient states that she is also intermittently doubled her lisinopril dose in the past.  Patient is also taking Norvasc 10 mg daily. Patient denies any high salt intake , although patient does report eating sausage on an almost daily basis. Patient denies any headache, LE swelling. Patient is poor intermittent chest pain and shortness of breath. Patient states that the chest pain occurs most often at night. Chest pain is described as being along anterior chest diffusely without radiation independent of exertion and is relieved with Seroquel. Patient states that she can walk up a flight of stairs with minimal shortness of breath. No active chest pain today.   Diabetes: Patient is receiving seen at pharmacy clinic here at [facility]. At that time patient was started on Lantus 5 units daily with one unit of titration daily for blood sugars greater than 100. Today patient states she's taken Lantus  10 daily. Patient also on Glucotrol XL 5. Patient states her blood sugars have been running in the 200s to 300s. Intermittent polyuria polydipsia.  Review of Systems See HPI     Objective:   Physical Exam Gen: up in chair, NAD, obese  HEENT: NCAT, EOMI, TMs clear bilaterally  CV: RRR, no murmurs auscultated  PULM: CTAB, no wheezes, rales, rhoncii  ABD: obese, + mild epigastric tenderness, + ventral hernia, reducible-non tender,  EXT: 2+ peripheral pulses    EKG: NSR, inverted t waves in anterolateral leads; unchanged from previous EKGs, HR 99.  Assessment & Plan:

## 2011-01-15 NOTE — Assessment & Plan Note (Signed)
Lopressor increased to 150 BID in setting of elevated BP and elevated HR (99-120). Pt has been intermittently on ACE. Instructed strict compliance with pt. Will recheck Cr and K. Will follow up in 1-2 weeks. Pt agreeable.

## 2011-01-15 NOTE — Telephone Encounter (Signed)
Pharmacy refused to refill rx sent escript due to being written the same as what is already on file.  Pt not due to refill per pharmacy until 7 days from now.  Need rx rewritten and resent to pharmacy.

## 2011-01-15 NOTE — Assessment & Plan Note (Signed)
Will increase lantus to 15 units daily. A1C pending next month.

## 2011-01-16 ENCOUNTER — Other Ambulatory Visit (HOSPITAL_COMMUNITY): Payer: Self-pay | Admitting: Family Medicine

## 2011-01-16 DIAGNOSIS — Z01810 Encounter for preprocedural cardiovascular examination: Secondary | ICD-10-CM

## 2011-01-16 LAB — BASIC METABOLIC PANEL
BUN: 10 mg/dL (ref 6–23)
CO2: 28 mEq/L (ref 19–32)
Calcium: 9.5 mg/dL (ref 8.4–10.5)
Chloride: 99 mEq/L (ref 96–112)
Creat: 0.77 mg/dL (ref 0.50–1.10)
Glucose, Bld: 225 mg/dL — ABNORMAL HIGH (ref 70–99)
Potassium: 4.3 mEq/L (ref 3.5–5.3)
Sodium: 139 mEq/L (ref 135–145)

## 2011-01-17 ENCOUNTER — Ambulatory Visit (INDEPENDENT_AMBULATORY_CARE_PROVIDER_SITE_OTHER): Payer: 59 | Admitting: *Deleted

## 2011-01-17 ENCOUNTER — Other Ambulatory Visit: Payer: Self-pay | Admitting: Family Medicine

## 2011-01-17 ENCOUNTER — Inpatient Hospital Stay (HOSPITAL_COMMUNITY): Admission: RE | Admit: 2011-01-17 | Payer: 59 | Source: Ambulatory Visit | Admitting: Surgery

## 2011-01-17 DIAGNOSIS — I10 Essential (primary) hypertension: Secondary | ICD-10-CM

## 2011-01-17 MED ORDER — LISINOPRIL 20 MG PO TABS: 20.0000 mg | ORAL_TABLET | Freq: Every day | ORAL | Status: DC

## 2011-01-17 NOTE — Telephone Encounter (Signed)
Dr. Alvester Morin states  patient needs appointment to specifically discuss this form.  Message left at number provided to call back.

## 2011-01-17 NOTE — Progress Notes (Signed)
Patient in for BP check today. B P checked manually with large adult cuff. BP LA 130/70 and RA 130/ 62 pulse 84.  Patient was unable to get lisinopril from pharmacy because she received # 90 tabs on 11/04/2010 and will not be able to get for insurance to pay for until 10 days from now.  Paged Dr. Alvester Morin  with BP results today. He advises he will send in new RX for  20 mg lisinopril instead of the 40 mg. Pharmacy states she still cannot get rx for insurance to pay but she can pay out of pocket. Patient willing to do this. Advised patient that Dr. Alvester Morin will send in new RX . She has a follow up with MD scheduled.

## 2011-01-17 NOTE — Telephone Encounter (Signed)
Rx called for lisinopril in to pharmacy. BP noted 130/70 today. Dose halved to 20mg  daily given improved BPs. Will continue this until follow up visit.

## 2011-01-17 NOTE — Telephone Encounter (Signed)
Patient has scheduled appointment for 01/22/2011. Form is put back in MD box.

## 2011-01-20 ENCOUNTER — Ambulatory Visit (HOSPITAL_COMMUNITY): Payer: 59 | Attending: Cardiovascular Disease | Admitting: Radiology

## 2011-01-20 VITALS — Ht 63.0 in | Wt 217.0 lb

## 2011-01-20 DIAGNOSIS — Z01810 Encounter for preprocedural cardiovascular examination: Secondary | ICD-10-CM | POA: Insufficient documentation

## 2011-01-20 DIAGNOSIS — R079 Chest pain, unspecified: Secondary | ICD-10-CM | POA: Insufficient documentation

## 2011-01-20 DIAGNOSIS — R9431 Abnormal electrocardiogram [ECG] [EKG]: Secondary | ICD-10-CM

## 2011-01-20 DIAGNOSIS — E119 Type 2 diabetes mellitus without complications: Secondary | ICD-10-CM

## 2011-01-20 MED ORDER — TECHNETIUM TC 99M TETROFOSMIN IV KIT
11.0000 | PACK | Freq: Once | INTRAVENOUS | Status: AC | PRN
  Administered 2011-01-20: 11 via INTRAVENOUS

## 2011-01-20 MED ORDER — REGADENOSON 0.4 MG/5ML IV SOLN
0.4000 mg | Freq: Once | INTRAVENOUS | Status: AC
  Administered 2011-01-20: 0.4 mg via INTRAVENOUS

## 2011-01-20 MED ORDER — TECHNETIUM TC 99M TETROFOSMIN IV KIT
33.0000 | PACK | Freq: Once | INTRAVENOUS | Status: AC | PRN
  Administered 2011-01-20: 33 via INTRAVENOUS

## 2011-01-20 NOTE — Progress Notes (Signed)
[facility] SITE 3 NUCLEAR MED [address] [place] [address] [phone]  Cardiology Nuclear Med Study  [name] [name] is a 60 y.o. female [id number] [birth year]   Nuclear Med Background Indication for Stress Test:  Evaluation for Ischemia, Surgical Clearance for ventral hernia repair with Dr. Corliss Skains and Abnormal EKG History:  Asthma and 07/11 Echo: EF 60-65% mild LVH Cardiac Risk Factors: CVA, Family History - CAD, Hypertension, IDDM Type 2 and Smoker  Symptoms:  Chest Pain, DOE, Palpitations and SOB   Nuclear Pre-Procedure Caffeine/Decaff Intake:  9:00pm NPO After: 10:00pm   Lungs:  clear IV 0.9% NS with Angio Cath:  22g  IV Site: L Hand  IV Started by:  Cathlyn Parsons, RN  Chest Size (in):  32 Cup Size: B  Height: 5\' 3"  (1.6 m)  Weight:  217 lb (98.431 kg)  BMI:  Body mass index is 38.44 kg/(m^2). Tech Comments:  Metoprolol held x 24 hrs.Blood sugar 265 fasting    Nuclear Med Study 1 or 2 day study: 1 day  Stress Test Type:  Lexiscan  Reading MD: Cassell Clement, MD  Order Authorizing Provider:  Deniece Portela Hale/ Shelle Iron  Resting Radionuclide: Technetium 67m Tetrofosmin  Resting Radionuclide Dose: 11.0 mCi   Stress Radionuclide:  Technetium 59m Tetrofosmin  Stress Radionuclide Dose: 33.0 mCi           Stress Protocol Rest HR: 75 Stress HR: 93  Rest BP: 128/66 Stress BP: 149/43  Exercise Time (min): n/a METS: n/a   Predicted Max HR: 158 bpm % Max HR: 58.86 bpm Rate Pressure Product: 46962   Dose of Adenosine (mg):  n/a Dose of Lexiscan: 0.4 mg  Dose of Atropine (mg): n/a Dose of Dobutamine: n/a mcg/kg/min (at max HR)  Stress Test Technologist: Milana Na, EMT-P  Nuclear Technologist:  Domenic Polite, CNMT     Rest Procedure:  Myocardial perfusion imaging was performed at rest 45 minutes following the intravenous administration of Technetium 65m Tetrofosmin. Rest ECG: NSR with non-specific ST-T wave changes  Stress Procedure:   The patient received IV Lexiscan 0.4 mg over 15-seconds.  Technetium 34m Tetrofosmin injected at 30-seconds.  There were no significant changes, + sob, and abdominal irritation with Lexiscan.  Quantitative spect images were obtained after a 45 minute delay. Stress ECG: No significant change from baseline ECG  QPS Raw Data Images:  Normal; no motion artifact; normal heart/lung ratio. Stress Images:  Normal homogeneous uptake in all areas of the myocardium. Rest Images:  Normal homogeneous uptake in all areas of the myocardium. Subtraction (SDS):  No evidence of ischemia. Transient Ischemic Dilatation (Normal <1.22):  0.93 Lung/Heart Ratio (Normal <0.45):  0.29  Quantitative Gated Spect Images QGS EDV:  73 ml QGS ESV:  16 ml QGS cine images:  NL LV Function; NL Wall Motion QGS EF: 78%  Impression Exercise Capacity:  Lexiscan with no exercise. BP Response:  Normal blood pressure response. Clinical Symptoms:  No chest pain. ECG Impression:  No significant ST segment change suggestive of ischemia. Comparison with Prior Nuclear Study: No previous nuclear study performed  Overall Impression:  Normal stress nuclear study.   Cassell Clement

## 2011-01-22 ENCOUNTER — Encounter: Payer: Self-pay | Admitting: Family Medicine

## 2011-01-22 ENCOUNTER — Ambulatory Visit (INDEPENDENT_AMBULATORY_CARE_PROVIDER_SITE_OTHER): Payer: 59 | Admitting: Family Medicine

## 2011-01-22 ENCOUNTER — Telehealth: Payer: Self-pay | Admitting: Family Medicine

## 2011-01-22 VITALS — BP 134/80 | HR 85 | Temp 98.6°F | Ht 63.0 in | Wt 213.0 lb

## 2011-01-22 DIAGNOSIS — Z01810 Encounter for preprocedural cardiovascular examination: Secondary | ICD-10-CM

## 2011-01-22 DIAGNOSIS — I10 Essential (primary) hypertension: Secondary | ICD-10-CM

## 2011-01-22 DIAGNOSIS — J309 Allergic rhinitis, unspecified: Secondary | ICD-10-CM

## 2011-01-22 DIAGNOSIS — Z720 Tobacco use: Secondary | ICD-10-CM

## 2011-01-22 DIAGNOSIS — F17200 Nicotine dependence, unspecified, uncomplicated: Secondary | ICD-10-CM

## 2011-01-22 LAB — BASIC METABOLIC PANEL
BUN: 12 mg/dL (ref 6–23)
CO2: 25 mEq/L (ref 19–32)
Calcium: 9.2 mg/dL (ref 8.4–10.5)
Chloride: 99 mEq/L (ref 96–112)
Creat: 0.82 mg/dL (ref 0.50–1.10)
Glucose, Bld: 212 mg/dL — ABNORMAL HIGH (ref 70–99)
Potassium: 3.7 mEq/L (ref 3.5–5.3)
Sodium: 136 mEq/L (ref 135–145)

## 2011-01-22 LAB — POCT I-STAT 4, (NA,K, GLUC, HGB,HCT)
Glucose, Bld: 113 — ABNORMAL HIGH
HCT: 41
Hemoglobin: 13.9
Potassium: 3.8
Sodium: 139

## 2011-01-22 MED ORDER — NICOTINE 7 MG/24HR TD PT24: 1.0000 | MEDICATED_PATCH | TRANSDERMAL | Status: DC

## 2011-01-22 MED ORDER — MONTELUKAST SODIUM 10 MG PO TABS: 10.0000 mg | ORAL_TABLET | Freq: Every day | ORAL | Status: DC

## 2011-01-22 MED ORDER — NICOTINE 7 MG/24HR TD PT24: 1.0000 | MEDICATED_PATCH | TRANSDERMAL | Status: AC

## 2011-01-22 NOTE — Patient Instructions (Signed)
It was good to see you today  I am starting you on singulair for your allergies For your smoking, I am prescribing nicotine patches Be sure to check your blood presseures every day  Be sure to follow up with the Cardiologist as previously scheduled,  Come back to see me next week,  Call with any questions,  God Bless,  Doree Albee MD

## 2011-01-22 NOTE — Progress Notes (Signed)
  Subjective:    Patient ID: [name] [name], female    DOB: 1950-06-28, 60 y.o.   MRN: [id number]  HPI Patient seen for chronic problem follow up:   Patient recently had a dobutamine stress test in setting of pending preoperative clearance for ventral hernia repair. Pt with noted multiple cardiovascular risk factors. Framingham score >17. Stress test was negative per report. Patient is also pending outpatient cardiology followup this week.  Allergic rhinitis: Patient states that she's had a recurrence of her allergic rhinitis including rhinorrhea nasal congestion for the past one to 2 weeks. Patient states she's been compliant with her Flonase. She takes one spray twice daily as well as her Zyrtec 10mg  daily. Patient reports persistent runny nose despite these medications. Patient is also a smoker.  Hypertension: Patient's metoprolol was recently increased in setting of elevated blood pressure and and borderline tachycardia at last clinical visit. Patient states her pressures are much improved since medication was started with systolic blood pressures running to the 150s at home. No  shortness of breath, or headache. Patient does report mild chest pain which is been a chronic issue.  Review of Systems See HPI     Objective:   Physical Exam  Gen: up in chair, NAD, obese  HEENT: NCAT, EOMI, TMs clear bilaterally  CV: RRR, no murmurs auscultated  PULM: CTAB, no wheezes, rales, rhoncii  ABD: obese, + mild epigastric tenderness, + ventral hernia, reducible-non tender,  EXT: 2+ peripheral pulses     Assessment & Plan:

## 2011-01-22 NOTE — Assessment & Plan Note (Signed)
Improved with metoprolol 150. We'll check a BMET today. Will continue with current regimen.

## 2011-01-22 NOTE — Assessment & Plan Note (Signed)
Will start on Singulair today in addition to current regimen. Likely exacerbating factor is smoking.

## 2011-01-22 NOTE — Telephone Encounter (Signed)
Rxs sent in to [facility].

## 2011-01-22 NOTE — Progress Notes (Signed)
Addended by: Floydene Flock on: 01/22/2011 07:12 PM   Modules accepted: Orders

## 2011-01-22 NOTE — Assessment & Plan Note (Signed)
Negative stress test. Is pending further cardiac evaluation this week. Will likely refer back to surgery for ventral hernia repair in the next 1-2 weeks.

## 2011-01-22 NOTE — Telephone Encounter (Signed)
Please resend rx dispensed to Outpt pharmcy to [facility] on [address].  Pt can't get because [employer] Resources have not set up her [employer] in system.  Pt on her way to pharmacy now

## 2011-01-22 NOTE — Assessment & Plan Note (Signed)
Patient is ready to quit. Will start on nicotine patch.

## 2011-01-24 ENCOUNTER — Ambulatory Visit (INDEPENDENT_AMBULATORY_CARE_PROVIDER_SITE_OTHER): Payer: 59 | Admitting: Cardiovascular Disease

## 2011-01-24 ENCOUNTER — Encounter: Payer: Self-pay | Admitting: Cardiovascular Disease

## 2011-01-24 VITALS — BP 120/72 | HR 70 | Resp 18 | Ht 63.0 in | Wt 214.0 lb

## 2011-01-24 DIAGNOSIS — Z01810 Encounter for preprocedural cardiovascular examination: Secondary | ICD-10-CM

## 2011-01-24 DIAGNOSIS — R002 Palpitations: Secondary | ICD-10-CM | POA: Insufficient documentation

## 2011-01-24 NOTE — Patient Instructions (Signed)
Your physician recommends that you schedule a follow-up appointment in: 2 weeks  Your physician has recommended that you wear a holter monitor. Holter monitors are medical devices that record the heart's electrical activity. Doctors most often use these monitors to diagnose arrhythmias. Arrhythmias are problems with the speed or rhythm of the heartbeat. The monitor is a small, portable device. You can wear one while you do your normal daily activities. This is usually used to diagnose what is causing palpitations/syncope (passing out).   

## 2011-01-24 NOTE — Assessment & Plan Note (Signed)
No further ischemic workup needed before surgery. She can proceed to her surgery as planned.

## 2011-01-24 NOTE — Assessment & Plan Note (Signed)
Will have her wear a 48 hour Holter monitor.  

## 2011-01-24 NOTE — Progress Notes (Signed)
History of Present Illness:60 yo AAF with history of DM, HTN, HLD, GERD, CVA here today for pre-operative risk assessment prior to planned hernia surgery. She tells me that she has problems with SOB and stinging type pain in her chest. She also notes palpitations at night. She had an echo in July 2011 which showed normal LV function, no valvular disease, mild LVH. Stress myoview on October 1,2012 with no evidence of ischemia. No exertional chest pain or pressure. Baseline SOB for months.   Her hernia surgery is planned by Dr. Gwinda Passe.   Past Medical History  Diagnosis Date  . Constipation   . Chronic pain syndrome   . Depression   . Diabetes mellitus   . GERD (gastroesophageal reflux disease)   . Hyperlipidemia   . Hypertension   . Allergy   . Cataract   . Urinary incontinence   . Fibromyalgia   . Anemia   . Pancreatitis   . Arthritis   . Asthma   . Heart murmur   . Stroke   . Fever chills   . Hearing loss     left side  . Nasal congestion   . Sore throat   . Visual disturbance   . Cough   . Abdominal distention   . Abdominal pain   . Rectal bleeding   . Nausea & vomiting   . Leg swelling     little blisters   . Difficulty urinating   . Headaches, cluster     Past Surgical History  Procedure Date  . Hernia repair   . Cholecystectomy   . Abdominal hysterectomy   . Dental surgery   . Eye surgery   . Colonoscopy     Current Outpatient Prescriptions  Medication Sig Dispense Refill  . albuterol (PROAIR HFA) 108 (90 BASE) MCG/ACT inhaler Inhale 2 puffs into the lungs every 6 (six) hours as needed for wheezing.  1 Inhaler  1  . amLODipine (NORVASC) 10 MG tablet Take 1 tablet (10 mg total) by mouth daily.  30 tablet  11  . aspirin 500 MG tablet Take 1 tablet (500 mg total) by mouth every 6 (six) hours as needed for pain.  30 tablet  0  . aspirin 81 MG tablet Take 81 mg by mouth daily.       . cetirizine (ZYRTEC) 10 MG tablet Take 10 mg by mouth daily as needed.        .  docusate sodium (COLACE) 100 MG capsule Take 100 mg by mouth 2 (two) times daily.        Marland Kitchen FLUoxetine (PROZAC) 40 MG capsule Take 40 mg by mouth daily.        . fluticasone (FLONASE) 50 MCG/ACT nasal spray Place 2 sprays into the nose daily.        Marland Kitchen glipiZIDE (GLUCOTROL) 5 MG 24 hr tablet TAKE 1 TABLET BY MOUTH ONCE DAILY  30 tablet  PRN  . glucose blood (ONE TOUCH TEST STRIPS) test strip Check blood sugar twice daily. Dispense QS       . hydrochlorothiazide 25 MG tablet Take 25 mg by mouth every morning.        . hydrocortisone (ANUSOL-HC) 2.5 % rectal cream Place rectally as directed. Per box instructions       . hydrocortisone 1 % cream Apply to affected area 2 times daily  30 g  1  . HYDROmorphone (DILAUDID) 3 MG suppository Place 3 mg rectally every 6 (six) hours as needed.        Marland Kitchen  Hypromellose (ARTIFICIAL TEARS OP) 1 drop in each eye as needed for dry eye       . insulin glargine (LANTUS) 100 UNIT/ML injection Inject 15 Units into the skin every morning.        . Insulin Pen Needle (BD ULTRA-FINE PEN NEEDLES) 29G X 12.7MM MISC Dispense one box QS for once daily insulin injection.  1 each  11  . lisinopril (PRINIVIL,ZESTRIL) 20 MG tablet Take 1 tablet (20 mg total) by mouth daily.  60 tablet  6  . metoCLOPramide (REGLAN) 5 MG tablet        . metoprolol tartrate (LOPRESSOR) 50 MG tablet Take 3 tablets (150 mg total) by mouth 2 (two) times daily.  200 tablet  6  . montelukast (SINGULAIR) 10 MG tablet Take 1 tablet (10 mg total) by mouth at bedtime.  30 tablet  2  . nicotine (NICODERM CQ - DOSED IN MG/24 HR) 7 mg/24hr patch Place 1 patch onto the skin daily.  30 patch  0  . nicotine polacrilex (NICORETTE) 2 MG gum Take 2 mg by mouth as needed. Use as needed for craving.   Use up to 10 pieces per day.       . nortriptyline (PAMELOR) 25 MG capsule Take 1 capsule (25 mg total) by mouth at bedtime.  30 capsule  2  . nystatin (MYCOSTATIN) cream Apply topically 2 (two) times daily as needed.          . ondansetron (ZOFRAN) 4 MG tablet Take 1 tablet (4 mg total) by mouth every 8 (eight) hours as needed.  20 tablet  0  . ONETOUCH DELICA LANCETS MISC Test blood sugar 2 times daily. Dispense QS       . polyethylene glycol (GLYCOLAX) packet Take 17 g by mouth 2 (two) times daily. Mix in 4-6 oz of water      . prednisoLONE acetate (PRED FORTE) 1 % ophthalmic suspension Place 1 drop into both eyes 2 (two) times daily.        . pregabalin (LYRICA) 25 MG capsule Take 25 mg by mouth 4 (four) times daily. 3 or 4 doses daily       . QUEtiapine (SEROQUEL) 200 MG tablet Take 100 mg by mouth at bedtime. Dose is 1/2 tablet (100mg ) of 200mg  tablet       . DISCONTD: insulin glargine (LANTUS SOLOSTAR) 100 UNIT/ML injection Inject 5 Units into the skin every morning. Increase morning dose by 1 unit each morning if your blood sugar is >100 on your meter.  15 mL  12  . esomeprazole (NEXIUM) 40 MG capsule Take 1 capsule (40 mg total) by mouth daily before breakfast.  30 capsule  6    Allergies  Allergen Reactions  . Desvenlafaxine     REACTION: GI upset  . Duloxetine     REACTION: "wheezing" and nausea  . Latex   . Lithium Rash    History   Social History  . Marital Status: Single    Spouse Name: N/A    Number of Children: N/A  . Years of Education: N/A   Occupational History  . Not on file.   Social History Main Topics  . Smoking status: Current Some Day Smoker -- 0.3 packs/day for 30 years    Types: Cigarettes  . Smokeless tobacco: Current User   Comment: started at age 31 - quit for several years.  Restarted with death of child.   . Alcohol Use: No  . Drug Use: No  .  Sexually Active: No   Other Topics Concern  . Not on file   Social History Narrative  . No narrative on file    Family History  Problem Relation Age of Onset  . Heart disease Mother   . Diabetes Mother   . Stroke Mother     Review of Systems:  As stated in the HPI and otherwise negative.   BP 120/72  Pulse 70   Resp 18  Ht 5\' 3"  (1.6 m)  Wt 214 lb (97.07 kg)  BMI 37.91 kg/m2  Physical Examination: General: Well developed, well nourished, NAD HEENT: OP clear, mucus membranes moist SKIN: warm, dry. No rashes. Neuro: No focal deficits Musculoskeletal: Muscle strength 5/5 all ext Psychiatric: Mood and affect normal Neck: No JVD, no carotid bruits, no thyromegaly, no lymphadenopathy. Lungs:Clear bilaterally, no wheezes, rhonci, crackles Cardiovascular: Regular rate and rhythm. No murmurs, gallops or rubs. Abdomen:Soft. Bowel sounds present. Non-tender.  Extremities: No lower extremity edema. Pulses are 2 + in the bilateral DP/PT.  EKG:NSR, rate 70 bpm. LVH. Non-specific T wave changes.   Stress myoview 01/20/11:   Stress Procedure: The patient received IV Lexiscan 0.4 mg over 15-seconds. Technetium 82m Tetrofosmin injected at 30-seconds. There were no significant changes, + sob, and abdominal irritation with Lexiscan. Quantitative spect images were obtained after a 45 minute delay.  Stress ECG: No significant change from baseline ECG  QPS  Raw Data Images: Normal; no motion artifact; normal heart/lung ratio.  Stress Images: Normal homogeneous uptake in all areas of the myocardium.  Rest Images: Normal homogeneous uptake in all areas of the myocardium.  Subtraction (SDS): No evidence of ischemia.  Transient Ischemic Dilatation (Normal <1.22): 0.93  Lung/Heart Ratio (Normal <0.45): 0.29  Quantitative Gated Spect Images  QGS EDV: 73 ml  QGS ESV: 16 ml  QGS cine images: NL LV Function; NL Wall Motion  QGS EF: 78%  Impression  Exercise Capacity: Lexiscan with no exercise.  BP Response: Normal blood pressure response.  Clinical Symptoms: No chest pain.  ECG Impression: No significant ST segment change suggestive of ischemia.  Comparison with Prior Nuclear Study: No previous nuclear study performed  Overall Impression: Normal stress nuclear study.

## 2011-01-29 ENCOUNTER — Ambulatory Visit: Payer: 59 | Admitting: Family Medicine

## 2011-01-31 ENCOUNTER — Encounter (INDEPENDENT_AMBULATORY_CARE_PROVIDER_SITE_OTHER): Payer: 59

## 2011-01-31 ENCOUNTER — Ambulatory Visit: Payer: 59 | Admitting: Family Medicine

## 2011-01-31 ENCOUNTER — Telehealth: Payer: Self-pay | Admitting: *Deleted

## 2011-01-31 DIAGNOSIS — R002 Palpitations: Secondary | ICD-10-CM

## 2011-01-31 DIAGNOSIS — Z01810 Encounter for preprocedural cardiovascular examination: Secondary | ICD-10-CM

## 2011-01-31 NOTE — Telephone Encounter (Signed)
PA required for omeprazole. Form placed in MD box. 

## 2011-02-10 ENCOUNTER — Encounter: Payer: Self-pay | Admitting: Family Medicine

## 2011-02-10 ENCOUNTER — Ambulatory Visit (INDEPENDENT_AMBULATORY_CARE_PROVIDER_SITE_OTHER): Payer: 59 | Admitting: Family Medicine

## 2011-02-10 VITALS — BP 156/77 | HR 89 | Temp 98.7°F | Ht 63.0 in | Wt 218.0 lb

## 2011-02-10 DIAGNOSIS — B370 Candidal stomatitis: Secondary | ICD-10-CM

## 2011-02-10 DIAGNOSIS — B353 Tinea pedis: Secondary | ICD-10-CM

## 2011-02-10 DIAGNOSIS — Z1211 Encounter for screening for malignant neoplasm of colon: Secondary | ICD-10-CM

## 2011-02-10 DIAGNOSIS — E119 Type 2 diabetes mellitus without complications: Secondary | ICD-10-CM

## 2011-02-10 LAB — POCT GLYCOSYLATED HEMOGLOBIN (HGB A1C): Hemoglobin A1C: 9.6

## 2011-02-10 MED ORDER — CLOTRIMAZOLE 1 % EX CREA: TOPICAL_CREAM | CUTANEOUS | Status: DC

## 2011-02-10 MED ORDER — NYSTATIN 100000 UNIT/ML MT SUSP: 500000.0000 [IU] | Freq: Four times a day (QID) | OROMUCOSAL | Status: DC

## 2011-02-10 NOTE — Patient Instructions (Signed)
It was good to see today I'm treating before oral thrush I am also treating you for foot fungus I'm drawing an A1c today; we may likely need to increase your insulin dose Come back to see me in 2-4 weeks for followup visit Call with any questions God Bless,  Doree Albee MD

## 2011-02-12 ENCOUNTER — Encounter: Payer: Self-pay | Admitting: Cardiovascular Disease

## 2011-02-12 ENCOUNTER — Ambulatory Visit (INDEPENDENT_AMBULATORY_CARE_PROVIDER_SITE_OTHER): Payer: 59 | Admitting: Cardiovascular Disease

## 2011-02-12 VITALS — BP 136/76 | HR 97 | Ht 63.0 in | Wt 219.0 lb

## 2011-02-12 DIAGNOSIS — R002 Palpitations: Secondary | ICD-10-CM

## 2011-02-12 NOTE — Assessment & Plan Note (Signed)
Likely secondary to PACs. See monitor. No further cardiac workup. She can proceed to her surgery as planned.

## 2011-02-12 NOTE — Patient Instructions (Signed)
Your physician recommends that you schedule a follow-up appointment as needed  

## 2011-02-12 NOTE — Progress Notes (Signed)
History of Present Illness:60 yo AAF with history of DM, HTN, HLD, GERD, CVA here today for cardiac follow up. I saw her three weeks ago for pre-operative risk assessment prior to planned hernia surgery. She told  me that she has problems with SOB and stinging type pain in her chest. She also notes palpitations at night. She had an echo in July 2011 which showed normal LV function, no valvular disease, mild LVH. Stress myoview on October 1,2012 with no evidence of ischemia. No exertional chest pain or pressure. Baseline SOB for months. I arranged a 48 hour holter monitor which showed PACs. She feels well. No chest pain or SOB.   Her hernia surgery is planned by Dr. Gwinda Passe.    Past Medical History  Diagnosis Date  . Constipation   . Chronic pain syndrome   . Depression   . Diabetes mellitus   . GERD (gastroesophageal reflux disease)   . Hyperlipidemia   . Hypertension   . Allergy   . Cataract   . Urinary incontinence   . Fibromyalgia   . Anemia   . Pancreatitis   . Arthritis   . Asthma   . Heart murmur   . Stroke   . Fever chills   . Hearing loss     left side  . Nasal congestion   . Sore throat   . Visual disturbance   . Cough   . Abdominal distention   . Abdominal pain   . Rectal bleeding   . Nausea & vomiting   . Leg swelling     little blisters   . Difficulty urinating   . Headaches, cluster     Past Surgical History  Procedure Date  . Hernia repair   . Cholecystectomy   . Abdominal hysterectomy   . Dental surgery   . Eye surgery   . Colonoscopy     Current Outpatient Prescriptions  Medication Sig Dispense Refill  . albuterol (PROAIR HFA) 108 (90 BASE) MCG/ACT inhaler Inhale 2 puffs into the lungs every 6 (six) hours as needed for wheezing.  1 Inhaler  1  . amLODipine (NORVASC) 10 MG tablet Take 1 tablet (10 mg total) by mouth daily.  30 tablet  11  . aspirin 500 MG tablet Take 1 tablet (500 mg total) by mouth every 6 (six) hours as needed for pain.  30 tablet   0  . aspirin 81 MG tablet Take 81 mg by mouth daily.       . cetirizine (ZYRTEC) 10 MG tablet Take 10 mg by mouth daily as needed.        . clotrimazole (LOTRIMIN AF) 1 % cream Apply to feet twice daily for 4 weeks  113 g  0  . docusate sodium (COLACE) 100 MG capsule Take 100 mg by mouth 2 (two) times daily.        Marland Kitchen esomeprazole (NEXIUM) 40 MG capsule Take 1 capsule (40 mg total) by mouth daily before breakfast.  30 capsule  6  . FLUoxetine (PROZAC) 40 MG capsule Take 40 mg by mouth daily.        . fluticasone (FLONASE) 50 MCG/ACT nasal spray Place 2 sprays into the nose daily.        Marland Kitchen glipiZIDE (GLUCOTROL) 5 MG 24 hr tablet TAKE 1 TABLET BY MOUTH ONCE DAILY  30 tablet  PRN  . glucose blood (ONE TOUCH TEST STRIPS) test strip Check blood sugar twice daily. Dispense QS       .  hydrochlorothiazide 25 MG tablet Take 25 mg by mouth every morning.        . hydrocortisone (ANUSOL-HC) 2.5 % rectal cream Place rectally as directed. Per box instructions       . hydrocortisone 1 % cream Apply to affected area 2 times daily  30 g  1  . HYDROmorphone (DILAUDID) 3 MG suppository Place 3 mg rectally every 6 (six) hours as needed.        . Hypromellose (ARTIFICIAL TEARS OP) 1 drop in each eye as needed for dry eye       . insulin glargine (LANTUS) 100 UNIT/ML injection Inject 15 Units into the skin every morning.        . Insulin Pen Needle (BD ULTRA-FINE PEN NEEDLES) 29G X 12.7MM MISC Dispense one box QS for once daily insulin injection.  1 each  11  . lisinopril (PRINIVIL,ZESTRIL) 20 MG tablet Take 1 tablet (20 mg total) by mouth daily.  60 tablet  6  . metoCLOPramide (REGLAN) 5 MG tablet        . metoprolol tartrate (LOPRESSOR) 50 MG tablet Take 3 tablets (150 mg total) by mouth 2 (two) times daily.  200 tablet  6  . montelukast (SINGULAIR) 10 MG tablet Take 1 tablet (10 mg total) by mouth at bedtime.  30 tablet  2  . nicotine (NICODERM CQ - DOSED IN MG/24 HR) 7 mg/24hr patch Place 1 patch onto the skin  daily.  30 patch  0  . nicotine polacrilex (NICORETTE) 2 MG gum Take 2 mg by mouth as needed. Use as needed for craving.   Use up to 10 pieces per day.       . nortriptyline (PAMELOR) 25 MG capsule Take 1 capsule (25 mg total) by mouth at bedtime.  30 capsule  2  . nystatin (MYCOSTATIN) 100000 UNIT/ML suspension Take 5 mLs (500,000 Units total) by mouth 4 (four) times daily.  480 mL  0  . nystatin (MYCOSTATIN) cream Apply topically 2 (two) times daily as needed.        . ondansetron (ZOFRAN) 4 MG tablet Take 1 tablet (4 mg total) by mouth every 8 (eight) hours as needed.  20 tablet  0  . ONETOUCH DELICA LANCETS MISC Test blood sugar 2 times daily. Dispense QS       . polyethylene glycol (GLYCOLAX) packet Take 17 g by mouth 2 (two) times daily. Mix in 4-6 oz of water      . prednisoLONE acetate (PRED FORTE) 1 % ophthalmic suspension Place 1 drop into both eyes 2 (two) times daily.        . pregabalin (LYRICA) 25 MG capsule Take 25 mg by mouth 4 (four) times daily. 3 or 4 doses daily       . QUEtiapine (SEROQUEL) 200 MG tablet Take 100 mg by mouth at bedtime. Dose is 1/2 tablet (100mg ) of 200mg  tablet       . DISCONTD: insulin glargine (LANTUS SOLOSTAR) 100 UNIT/ML injection Inject 5 Units into the skin every morning. Increase morning dose by 1 unit each morning if your blood sugar is >100 on your meter.  15 mL  12    Allergies  Allergen Reactions  . Desvenlafaxine     REACTION: GI upset  . Duloxetine     REACTION: "wheezing" and nausea  . Latex   . Lithium Rash    History   Social History  . Marital Status: Single    Spouse Name: N/A  Number of Children: N/A  . Years of Education: N/A   Occupational History  . Not on file.   Social History Main Topics  . Smoking status: Current Some Day Smoker -- 0.3 packs/day for 30 years    Types: Cigarettes  . Smokeless tobacco: Current User   Comment: started at age 20 - quit for several years.  Restarted with death of child.   . Alcohol  Use: No  . Drug Use: No  . Sexually Active: No   Other Topics Concern  . Not on file   Social History Narrative  . No narrative on file    Family History  Problem Relation Age of Onset  . Heart disease Mother   . Diabetes Mother   . Stroke Mother   . Heart attack Mother   . Heart disease Father     Review of Systems:  As stated in the HPI and otherwise negative.   BP 136/76  Pulse 97  Ht 5\' 3"  (1.6 m)  Wt 219 lb (99.338 kg)  BMI 38.79 kg/m2  Physical Examination: General: Well developed, well nourished, NAD HEENT: OP clear, mucus membranes moist SKIN: warm, dry. No rashes. Neuro: No focal deficits Musculoskeletal: Muscle strength 5/5 all ext Psychiatric: Mood and affect normal Neck: No JVD, no carotid bruits, no thyromegaly, no lymphadenopathy. Lungs:Clear bilaterally, no wheezes, rhonci, crackles Cardiovascular: Regular rate and rhythm. No murmurs, gallops or rubs. Abdomen:Soft. Bowel sounds present. Non-tender.  Extremities: No lower extremity edema. Pulses are 2 + in the bilateral DP/PT.  Holter monitor 01/31/11: Normal sinus rhythm. PACs. No VT, SVT or atrial fibrillation.

## 2011-02-16 NOTE — Progress Notes (Signed)
  Subjective:    Patient ID: [name] [name], female    DOB: [birth year], 60 y.o.   MRN: [id number]  HPI Pt is here for general medical follow up Of note, pt has been evaluated from a cardiac standpoint in setting of pending ventral hernia repair. Pt had a dobutamine stress test that was negative. Pt was also formally evaluated and cleared from a cardiology standpoint. Pt was evaluated by [facility].   Today pt is complaining of oral and foot fungus. Pt has been on nystatin swish and swallow on a regular basis for recurrent oral thrush in setting of intermittently controlled DM. Pt states that she hasrn out of this medication and has noticed recurrence of white patches on mouth. Pt denies any odynophagia or trouble swallowing.   Pt has also noted bilateral foot irritation over the last 2 weeks. Itching has been primarily in interdigit folds. No purulent drainage. Redness only associated with itching.    DM: checking CBGs daily. Ranging in 120s-140s. Intermittent polyuria, polydypsia.   Review of Systems See HPI     Objective:   Physical Exam Gen: up in chair, NAD HEENT: + white plaques consistent with oral thrush EXT: + bilateral foot flaking and scaling consistent with tinea pedis.       Assessment & Plan:

## 2011-02-17 DIAGNOSIS — B370 Candidal stomatitis: Secondary | ICD-10-CM | POA: Insufficient documentation

## 2011-02-17 DIAGNOSIS — B353 Tinea pedis: Secondary | ICD-10-CM | POA: Insufficient documentation

## 2011-02-17 NOTE — Assessment & Plan Note (Signed)
Nystatin swish and swallow refilled today. Wil reasess in 1-2 weeks.

## 2011-02-17 NOTE — Assessment & Plan Note (Signed)
Lotrimin prescribed. Will reasess in 1-2 weeks.

## 2011-02-17 NOTE — Assessment & Plan Note (Signed)
A1C drawn today. WIll reassess pending A1C.

## 2011-02-25 ENCOUNTER — Ambulatory Visit: Payer: 59 | Admitting: Family Medicine

## 2011-02-28 ENCOUNTER — Ambulatory Visit (INDEPENDENT_AMBULATORY_CARE_PROVIDER_SITE_OTHER): Payer: 59 | Admitting: Family Medicine

## 2011-02-28 VITALS — BP 160/88 | HR 73 | Ht 63.0 in | Wt 217.0 lb

## 2011-02-28 DIAGNOSIS — B353 Tinea pedis: Secondary | ICD-10-CM

## 2011-02-28 DIAGNOSIS — G589 Mononeuropathy, unspecified: Secondary | ICD-10-CM

## 2011-02-28 DIAGNOSIS — B370 Candidal stomatitis: Secondary | ICD-10-CM

## 2011-02-28 DIAGNOSIS — G629 Polyneuropathy, unspecified: Secondary | ICD-10-CM

## 2011-02-28 MED ORDER — PREGABALIN 50 MG PO CAPS: 50.0000 mg | ORAL_CAPSULE | Freq: Three times a day (TID) | ORAL | Status: DC

## 2011-02-28 NOTE — Patient Instructions (Signed)
It was good to see again I am increasing alert her to help with your foot pain Come back to see me around the new year Call if any questions God Bless, Doree Albee MD

## 2011-02-28 NOTE — Progress Notes (Signed)
  Subjective:    Patient ID: [name] [name], female    DOB: 1951-04-03, 60 y.o.   MRN: [id number]  HPI Patient is here for general medical visit. Patient recent was recent seen for concomitant oral and pedal fungal infections. Patient was placed on nystatin swish and swallow as well as topical Lotrimin. Today patient states this has helped tremendously with oral thrush and feet peeling. No without aphasia or lotion irritation.  Neuropathy: Patient does report however of persistent lotion the pain and tingling. Patient states this is a persistent process that seems to be independent of movement. Pt has also had some LE cramping that has been intermittent in nature. Pt states that she can walk up to 1/2 block before she develops leg and foot pain. Pt states that she has recently run out of her lyrica. Pt states that lyrica helps tremendously with foot pain.   Pt is also pending ventral hernia repair.   Review of Systems See HPI, otherwise 12 point ROS negative.     Objective:   Physical Exam Gen: up in chair, NAD  HEENT: + white plaques consistent with oral thrush, much improved EXT: + bilateral foot flaking and scaling consistent with tinea pedis, much improved MSK: LE strength 3/5 bilaterally,    + decreased monofilament sensation on #3 (great toe) and #1(5th distal metarsal) bilaterally     Assessment & Plan:

## 2011-03-01 DIAGNOSIS — E1142 Type 2 diabetes mellitus with diabetic polyneuropathy: Secondary | ICD-10-CM | POA: Insufficient documentation

## 2011-03-01 NOTE — Assessment & Plan Note (Signed)
Improving with lotrimin. Will continue to follow.

## 2011-03-01 NOTE — Assessment & Plan Note (Signed)
Much improved. Will continue with Nystatin swish and swallow until resolution of sxs.

## 2011-03-01 NOTE — Assessment & Plan Note (Signed)
LE pain consistent with neuropathy. Will increase lyrica today. The differential also includes intermittent claudication. Pt is noted to have significant risk factors for this. Would like to perform ABIs s/p ventral hernia repair to better assess. As an aside, ABIs may be falsely normal if pt has a calcific vessel changes (which is likely). Arteriogram may be best option to evaluate for this. But this can be discussed at a later date after hernia repair.

## 2011-03-05 ENCOUNTER — Encounter (HOSPITAL_COMMUNITY): Payer: Self-pay | Admitting: Pharmacy Technician

## 2011-03-07 ENCOUNTER — Encounter (HOSPITAL_COMMUNITY): Payer: Self-pay

## 2011-03-07 ENCOUNTER — Encounter (HOSPITAL_COMMUNITY)
Admission: RE | Admit: 2011-03-07 | Discharge: 2011-03-07 | Disposition: A | Discharge status: Home / Self Care | Payer: 59 | Source: Ambulatory Visit | Attending: Surgery | Admitting: Surgery

## 2011-03-07 HISTORY — DX: Dorsalgia, unspecified: M54.9

## 2011-03-07 HISTORY — DX: Pneumonia, unspecified organism: J18.9

## 2011-03-07 HISTORY — DX: Encounter for other specified aftercare: Z51.89

## 2011-03-07 HISTORY — DX: Anxiety disorder, unspecified: F41.9

## 2011-03-07 HISTORY — DX: Other specified cardiac arrhythmias: I49.8

## 2011-03-07 HISTORY — DX: Dermatitis, unspecified: L30.9

## 2011-03-07 HISTORY — DX: Reserved for inherently not codable concepts without codable children: IMO0001

## 2011-03-07 HISTORY — DX: Other chronic pain: G89.29

## 2011-03-07 HISTORY — DX: Polyneuropathy, unspecified: G62.9

## 2011-03-07 HISTORY — DX: Chronic cough: R05.3

## 2011-03-07 HISTORY — DX: Cough: R05

## 2011-03-07 HISTORY — DX: Frequency of micturition: R35.0

## 2011-03-07 LAB — COMPREHENSIVE METABOLIC PANEL
ALT: 21 U/L (ref 0–35)
AST: 23 U/L (ref 0–37)
Albumin: 3.8 g/dL (ref 3.5–5.2)
Alkaline Phosphatase: 112 U/L (ref 39–117)
BUN: 26 mg/dL — ABNORMAL HIGH (ref 6–23)
CO2: 25 mEq/L (ref 19–32)
Calcium: 9.3 mg/dL (ref 8.4–10.5)
Chloride: 98 mEq/L (ref 96–112)
Creatinine, Ser: 0.8 mg/dL (ref 0.50–1.10)
GFR calc Af Amer: 90 mL/min — ABNORMAL LOW (ref >90)
GFR calc non Af Amer: 77 mL/min — ABNORMAL LOW (ref >90)
Glucose, Bld: 334 mg/dL — ABNORMAL HIGH (ref 70–99)
Potassium: 4.7 mEq/L (ref 3.5–5.1)
Sodium: 133 mEq/L — ABNORMAL LOW (ref 135–145)
Total Bilirubin: 0.2 mg/dL — ABNORMAL LOW (ref 0.3–1.2)
Total Protein: 7.8 g/dL (ref 6.0–8.3)

## 2011-03-07 LAB — CBC
HCT: 38.9 % (ref 36.0–46.0)
Hemoglobin: 12.7 g/dL (ref 12.0–15.0)
MCH: 28.5 pg (ref 26.0–34.0)
MCHC: 32.6 g/dL (ref 30.0–36.0)
MCV: 87.2 fL (ref 78.0–100.0)
Platelets: 138 10*3/uL — ABNORMAL LOW (ref 150–400)
RBC: 4.46 MIL/uL (ref 3.87–5.11)
RDW: 13.6 % (ref 11.5–15.5)
WBC: 6.7 10*3/uL (ref 4.0–10.5)

## 2011-03-07 LAB — DIFFERENTIAL
Basophils Absolute: 0 10*3/uL (ref 0.0–0.1)
Basophils Relative: 1 % (ref 0–1)
Eosinophils Absolute: 0.2 10*3/uL (ref 0.0–0.7)
Eosinophils Relative: 3 % (ref 0–5)
Lymphocytes Relative: 47 % — ABNORMAL HIGH (ref 12–46)
Lymphs Abs: 3.1 10*3/uL (ref 0.7–4.0)
Monocytes Absolute: 0.5 10*3/uL (ref 0.1–1.0)
Monocytes Relative: 7 % (ref 3–12)
Neutro Abs: 2.8 10*3/uL (ref 1.7–7.7)
Neutrophils Relative %: 42 % — ABNORMAL LOW (ref 43–77)

## 2011-03-07 LAB — SURGICAL PCR SCREEN
MRSA, PCR: NEGATIVE
Staphylococcus aureus: NEGATIVE

## 2011-03-07 NOTE — Progress Notes (Deleted)
To request last ov and ? Echo from Dr.McAlhaney

## 2011-03-07 NOTE — Progress Notes (Signed)
Pt wanting to talk with Dr.Tseui before signing consent;will need to sign DOS

## 2011-03-07 NOTE — Progress Notes (Signed)
Cardiologist is Dr.McAlhaney and saw him about 2wks ago and was cleared for surgery  Stress test in epic

## 2011-03-07 NOTE — Pre-Procedure Instructions (Signed)
18 [name] [name]  03/07/2011   Your procedure is scheduled on: Mon,Nov 26 @ 0730  Report to [facility] at 0530 AM.  Call this number if you have problems the morning of surgery: [phone]   Remember:   Do not eat food:After Midnight.  Do not drink clear liquids: 4 Hours before arrival.May have soda,tea,black coffee,apple/grape juice,broth,or water until 1:30am  Take these medicines the morning of surgery with A SIP OF WATER: Albuterol,Amlodipine,Zyrtec(if needed),Nexium,Prozac,Flonase,Singulair,Lyrica   Do not wear jewelry, make-up or nail polish.  Do not wear lotions, powders, or perfumes. You may wear deodorant.  Do not shave 48 hours prior to surgery.  Do not bring valuables to the hospital.  Contacts, dentures or bridgework may not be worn into surgery.  Leave suitcase in the car. After surgery it may be brought to your room.  For patients admitted to the hospital, checkout time is 11:00 AM the day of discharge.   Patients discharged the day of surgery will not be allowed to drive home.  Name and phone number of your driver:   Special Instructions: CHG Shower Use Special Wash: 1/2 bottle night before surgery and 1/2 bottle morning of surgery.   Please read over the following fact sheets that you were given: Pain Booklet, Coughing and Deep Breathing, MRSA Information and Surgical Site Infection Prevention

## 2011-03-07 NOTE — Progress Notes (Signed)
Received call from [facility] with Dr. Derl Barrow office.  States patient ok for surgery but must check blood sugar morning prior to surgery.

## 2011-03-07 NOTE — Progress Notes (Signed)
Echo and clearance note in epic

## 2011-03-07 NOTE — Consult Note (Signed)
Anesthesia:  60 year old female for laproscopic VHR.  Hx + for chronic pain/fibromyalgia, anemia, DM, pancreatitis, asthma, HTN, murmur (but no valvular disease by 7/11 echo), CVA, GERD, Depression/anxiety, and smoking.  She saw Dr. Clifton James for preoperative cardiac clearance (see his note from 01/24/11 which includes her 01/20/11 stress test results).  She had a preop stress test showing no ischemia and an echo done 10/2009 showing normal LV function with EF 60-65%, no valvular disease, and mild LVH.  I have reviewed her preop labs, EKG (10/512) and CXR (01/13/11).  She will need her glucose rechecked on arrival to Short Stay.  Dr. Corliss Skains has reviewed her labs as well.

## 2011-03-07 NOTE — Progress Notes (Signed)
Quick Note:  This patient may proceed with surgery. Will need to have her blood sugar checked in the morning prior to surgery. ______

## 2011-03-16 ENCOUNTER — Other Ambulatory Visit (INDEPENDENT_AMBULATORY_CARE_PROVIDER_SITE_OTHER): Payer: Self-pay | Admitting: Surgery

## 2011-03-16 MED ORDER — CEFAZOLIN SODIUM-DEXTROSE 2-3 GM-% IV SOLR
2.0000 g | INTRAVENOUS | Status: AC
  Administered 2011-03-17: 2 g via INTRAVENOUS
  Filled 2011-03-16: qty 50

## 2011-03-16 NOTE — H&P (Signed)
Progress Notes   [name] [name] (MR# [id number])       Progress Notes Info       Author Note Status Last Update User Last Update Date/Time    Wilmon Arms. Corliss Skains, MD Signed Wilmon Arms. Corliss Skains, MD 12/19/2010 10:58 AM      Progress Notes     Chief Complaint   Patient presents with   .  Incisional Hernia        HPI [name] [name] is a 60 y.o. female.  HPI This patient is referred from come in family practice for evaluation of ventral hernias. The patient had a laparoscopic cholecystectomy in 1999. 2 years ago she was at work when a large heavy sink fell on her abdomen. She had to pull herself out from underneath the large sink. She has been disabled for the last 2 years due to the back injury that occurred during that accident. She has also developed a large lump in her upper abdomen. She also complains of frequent nausea. She has been found to have diabetic gastroparesis. A CT scan obtained in July showed a large upper midline ventral hernia containing transverse colon. She also has a smaller umbilical hernia. She has had long-standing problems with constipation requiring the use of laxatives for bowel movements. She was hospitalized in July for acute idiopathic pancreatitis she presents now for evaluation for hernia repair Past Medical History   Diagnosis  Date   .  Constipation     .  Chronic pain syndrome     .  Depression     .  Diabetes mellitus     .  GERD (gastroesophageal reflux disease)     .  Hyperlipidemia     .  Hypertension     .  Allergy     .  Cataract     .  Urinary incontinence     .  Fibromyalgia     .  Anemia     .  Pancreatitis     .  Arthritis     .  Asthma     .  Heart murmur     .  Stroke     .  Fever chills     .  Hearing loss         left side   .  Nasal congestion     .  Sore throat     .  Visual disturbance     .  Cough     .  Abdominal distention     .  Abdominal pain     .  Rectal bleeding     .  Nausea & vomiting     .  Leg swelling          little blisters    .  Difficulty urinating     .  Headaches, cluster           Past Surgical History   Procedure  Date   .  Hernia repair     .  Cholecystectomy     .  Abdominal hysterectomy     .  Dental surgery     .  Eye surgery     .  Colonoscopy           Family History   Problem  Relation  Age of Onset   .  Heart disease  Mother     .  Diabetes  Mother     .  Stroke  Mother          Social History History   Substance Use Topics   .  Smoking status:  Current Some Day Smoker -- 0.3 packs/day for 30 years       Types:  Cigarettes   .  Smokeless tobacco:  Current User     Comment: started at age 42 - quit for several years.  Restarted with death of child.    .  Alcohol Use:  No         Allergies   Allergen  Reactions   .  Desvenlafaxine         REACTION: GI upset   .  Duloxetine         REACTION: "wheezing" and nausea   .  Latex     .  Lithium  Rash         Current Outpatient Prescriptions   Medication  Sig  Dispense  Refill   .  albuterol (PROAIR HFA) 108 (90 BASE) MCG/ACT inhaler  Inhale 2 puffs into the lungs every 6 (six) hours as needed for wheezing.   1 Inhaler   1   .  amLODipine (NORVASC) 10 MG tablet  Take 1 tablet (10 mg total) by mouth daily.   30 tablet   11   .  aspirin 81 MG tablet  Take 81 mg by mouth daily.          .  cetirizine (ZYRTEC) 10 MG tablet  Take 10 mg by mouth daily as needed.           .  docusate sodium (COLACE) 100 MG capsule  Take 100 mg by mouth 2 (two) times daily.           Marland Kitchen  esomeprazole (NEXIUM) 40 MG capsule  Take 1 capsule (40 mg total) by mouth daily before breakfast.   30 capsule   6   .  FLUoxetine (PROZAC) 40 MG capsule  Take 40 mg by mouth daily.           .  fluticasone (FLONASE) 50 MCG/ACT nasal spray  1 spray by Nasal route daily.   16 g   2   .  glipiZIDE (GLUCOTROL) 5 MG 24 hr tablet  TAKE 1 TABLET BY MOUTH ONCE DAILY   30 tablet   PRN   .  glucose blood (ONE TOUCH TEST STRIPS) test strip  Check  blood sugar twice daily. Dispense QS          .  hydrochlorothiazide 25 MG tablet  Take 25 mg by mouth every morning.           .  hydrocortisone (ANUSOL-HC) 2.5 % rectal cream  Place rectally as directed. Per box instructions          .  hydrocortisone 1 % cream  Apply to affected area 2 times daily   30 g   1   .  HYDROmorphone (DILAUDID) 3 MG suppository  Place 3 mg rectally every 6 (six) hours as needed.           .  Hypromellose (ARTIFICIAL TEARS OP)  1 drop in each eye as needed for dry eye          .  lisinopril (PRINIVIL,ZESTRIL) 40 MG tablet  Take 40 mg by mouth daily.           .  metoCLOPramide (REGLAN) 5 MG tablet  TAKE 1 TABLET BY MOUTH FOUR TIMES DAILY   60  tablet   0   .  metoprolol tartrate (LOPRESSOR) 25 MG tablet  Take 100 mg by mouth 2 (two) times daily.          .  nicotine (NICODERM CQ - DOSED IN MG/24 HR) 7 mg/24hr patch  Place 1 patch onto the skin daily.   30 patch   0   .  nortriptyline (PAMELOR) 25 MG capsule  Take 1 capsule (25 mg total) by mouth at bedtime.   30 capsule   2   .  nystatin (MYCOSTATIN) cream  Apply topically 2 (two) times daily as needed.           .  ondansetron (ZOFRAN) 4 MG tablet  Take 1 tablet (4 mg total) by mouth every 8 (eight) hours as needed.   20 tablet   0   .  ONETOUCH DELICA LANCETS MISC  Test blood sugar 2 times daily. Dispense QS          .  polyethylene glycol (GLYCOLAX) packet  Take 17 g by mouth daily. Mix in 4-6 oz of water         .  prednisoLONE acetate (PRED FORTE) 1 % ophthalmic suspension  Place 1 drop into both eyes 2 (two) times daily.           .  pregabalin (LYRICA) 25 MG capsule  Take 1 capsule (25 mg total) by mouth 3 (three) times daily.   90 capsule   2   .  QUEtiapine (SEROQUEL) 200 MG tablet  Take 1 tablet (200 mg total) by mouth at bedtime.   30 tablet   1        Review of Systems Review of Systems Positive for chills, hearing loss, nasal congestion, sore throat, visual disturbance, cough, leg swelling, abdominal  distention and pain, hemorrhoids, nausea, vomiting, headaches, Blood pressure 158/82, pulse 58, temperature 96.7 F (35.9 C), height 5\' 4"  (1.626 m), weight 205 lb 4 oz (93.101 kg).   Physical Exam Physical Exam WDWN in NAD HEENT:  EOMI, sclera anicteric Neck:  No masses, no thyromegaly Lungs:  CTA bilaterally; normal respiratory effort CV:  Regular rate and rhythm; no murmurs Abd:  +bowel sounds, soft, large upper midline mass, palpable umbilical hernia Ext:  Well-perfused; no edema Skin:  Warm, dry; no sign of jaundice   Data Reviewed CT scans - July 2012 - midline ventral/ umbilical hernias   Assessment    Ventral/ umbilical hernias   Multiple medical comorbidities     Plan    Laparoscopic ventral hernia repair with mesh.The surgical procedure has been discussed with the patient.  Potential risks, benefits, alternative treatments, and expected outcomes have been explained.  All of the patient's questions at this time have been answered.  The patient understand the proposed surgical procedure and wishes to proceed.   I also recommended that she speak to her primary physician about a GI referral for her pancreatitis and her diabetic gastroparesis.  Cardiac clearance has been obtained from Dr. Clifton James.          Mireille Lacombe K. 8:07 PM 03/16/2011

## 2011-03-17 ENCOUNTER — Inpatient Hospital Stay (HOSPITAL_COMMUNITY): Payer: 59 | Admitting: Vascular Surgery

## 2011-03-17 ENCOUNTER — Inpatient Hospital Stay (HOSPITAL_COMMUNITY)
Admission: RE | Admit: 2011-03-17 | Discharge: 2011-03-21 | DRG: 337 | Disposition: A | Discharge status: Home / Self Care | Payer: 59 | Source: Ambulatory Visit | Attending: Surgery | Admitting: Surgery

## 2011-03-17 ENCOUNTER — Encounter (HOSPITAL_COMMUNITY): Payer: Self-pay | Admitting: Vascular Surgery

## 2011-03-17 ENCOUNTER — Encounter (HOSPITAL_COMMUNITY): Payer: Self-pay | Admitting: *Deleted

## 2011-03-17 ENCOUNTER — Encounter (HOSPITAL_COMMUNITY)
Admission: RE | Disposition: A | Discharge status: Home / Self Care | Payer: Self-pay | Source: Ambulatory Visit | Attending: Surgery

## 2011-03-17 DIAGNOSIS — Z7982 Long term (current) use of aspirin: Secondary | ICD-10-CM

## 2011-03-17 DIAGNOSIS — E785 Hyperlipidemia, unspecified: Secondary | ICD-10-CM | POA: Diagnosis present

## 2011-03-17 DIAGNOSIS — R339 Retention of urine, unspecified: Secondary | ICD-10-CM | POA: Diagnosis not present

## 2011-03-17 DIAGNOSIS — K5900 Constipation, unspecified: Secondary | ICD-10-CM | POA: Diagnosis present

## 2011-03-17 DIAGNOSIS — Z794 Long term (current) use of insulin: Secondary | ICD-10-CM

## 2011-03-17 DIAGNOSIS — Z79899 Other long term (current) drug therapy: Secondary | ICD-10-CM

## 2011-03-17 DIAGNOSIS — K439 Ventral hernia without obstruction or gangrene: Secondary | ICD-10-CM

## 2011-03-17 DIAGNOSIS — Z8673 Personal history of transient ischemic attack (TIA), and cerebral infarction without residual deficits: Secondary | ICD-10-CM

## 2011-03-17 DIAGNOSIS — K219 Gastro-esophageal reflux disease without esophagitis: Secondary | ICD-10-CM | POA: Diagnosis present

## 2011-03-17 DIAGNOSIS — G894 Chronic pain syndrome: Secondary | ICD-10-CM | POA: Diagnosis present

## 2011-03-17 DIAGNOSIS — E1149 Type 2 diabetes mellitus with other diabetic neurological complication: Secondary | ICD-10-CM | POA: Diagnosis present

## 2011-03-17 DIAGNOSIS — J309 Allergic rhinitis, unspecified: Secondary | ICD-10-CM | POA: Diagnosis present

## 2011-03-17 DIAGNOSIS — K3184 Gastroparesis: Secondary | ICD-10-CM | POA: Diagnosis present

## 2011-03-17 DIAGNOSIS — Z8249 Family history of ischemic heart disease and other diseases of the circulatory system: Secondary | ICD-10-CM

## 2011-03-17 DIAGNOSIS — Z833 Family history of diabetes mellitus: Secondary | ICD-10-CM

## 2011-03-17 DIAGNOSIS — J45909 Unspecified asthma, uncomplicated: Secondary | ICD-10-CM | POA: Diagnosis present

## 2011-03-17 DIAGNOSIS — I10 Essential (primary) hypertension: Secondary | ICD-10-CM | POA: Diagnosis present

## 2011-03-17 DIAGNOSIS — K660 Peritoneal adhesions (postprocedural) (postinfection): Secondary | ICD-10-CM | POA: Diagnosis present

## 2011-03-17 DIAGNOSIS — N3010 Interstitial cystitis (chronic) without hematuria: Secondary | ICD-10-CM | POA: Diagnosis present

## 2011-03-17 DIAGNOSIS — F17200 Nicotine dependence, unspecified, uncomplicated: Secondary | ICD-10-CM | POA: Diagnosis present

## 2011-03-17 DIAGNOSIS — K432 Incisional hernia without obstruction or gangrene: Principal | ICD-10-CM | POA: Diagnosis present

## 2011-03-17 DIAGNOSIS — Z823 Family history of stroke: Secondary | ICD-10-CM

## 2011-03-17 DIAGNOSIS — F341 Dysthymic disorder: Secondary | ICD-10-CM | POA: Diagnosis present

## 2011-03-17 DIAGNOSIS — G609 Hereditary and idiopathic neuropathy, unspecified: Secondary | ICD-10-CM | POA: Diagnosis present

## 2011-03-17 HISTORY — PX: VENTRAL HERNIA REPAIR: SHX424

## 2011-03-17 LAB — GLUCOSE, CAPILLARY
Glucose-Capillary: 262 mg/dL — ABNORMAL HIGH (ref 70–99)
Glucose-Capillary: 231 mg/dL — ABNORMAL HIGH (ref 70–99)
Glucose-Capillary: 271 mg/dL — ABNORMAL HIGH (ref 70–99)
Glucose-Capillary: 245 mg/dL — ABNORMAL HIGH (ref 70–99)
Glucose-Capillary: 173 mg/dL — ABNORMAL HIGH (ref 70–99)

## 2011-03-17 LAB — CBC
HCT: 36.4 % (ref 36.0–46.0)
Hemoglobin: 11.4 g/dL — ABNORMAL LOW (ref 12.0–15.0)
MCH: 27.7 pg (ref 26.0–34.0)
MCHC: 31.3 g/dL (ref 30.0–36.0)
MCV: 88.3 fL (ref 78.0–100.0)
Platelets: 144 10*3/uL — ABNORMAL LOW (ref 150–400)
RBC: 4.12 MIL/uL (ref 3.87–5.11)
RDW: 13.8 % (ref 11.5–15.5)
WBC: 9.5 10*3/uL (ref 4.0–10.5)

## 2011-03-17 LAB — CREATININE, SERUM
Creatinine, Ser: 0.83 mg/dL (ref 0.50–1.10)
GFR calc Af Amer: 86 mL/min — ABNORMAL LOW (ref >90)
GFR calc non Af Amer: 74 mL/min — ABNORMAL LOW (ref >90)

## 2011-03-17 LAB — HEMOGLOBIN A1C
Hgb A1c MFr Bld: 10.8 % — ABNORMAL HIGH (ref <5.7)
Mean Plasma Glucose: 263 mg/dL — ABNORMAL HIGH (ref <117)

## 2011-03-17 SURGERY — REPAIR, HERNIA, VENTRAL, LAPAROSCOPIC
Anesthesia: General | Site: Abdomen | Wound class: Clean

## 2011-03-17 MED ORDER — NEOSTIGMINE METHYLSULFATE 1 MG/ML IJ SOLN
INTRAMUSCULAR | Status: DC | PRN
  Administered 2011-03-17: 5 mg via INTRAVENOUS

## 2011-03-17 MED ORDER — KCL IN DEXTROSE-NACL 20-5-0.45 MEQ/L-%-% IV SOLN
INTRAVENOUS | Status: DC
  Administered 2011-03-17 (×2): via INTRAVENOUS
  Filled 2011-03-17 (×4): qty 1000

## 2011-03-17 MED ORDER — METOCLOPRAMIDE HCL 5 MG/ML IJ SOLN
INTRAMUSCULAR | Status: DC | PRN
  Administered 2011-03-17 (×2): 10 mg via INTRAVENOUS

## 2011-03-17 MED ORDER — PREDNISOLONE ACETATE 1 % OP SUSP
1.0000 [drp] | Freq: Two times a day (BID) | OPHTHALMIC | Status: DC
  Administered 2011-03-17 – 2011-03-21 (×9): 1 [drp] via OPHTHALMIC
  Filled 2011-03-17: qty 1

## 2011-03-17 MED ORDER — ALBUTEROL SULFATE HFA 108 (90 BASE) MCG/ACT IN AERS
2.0000 | INHALATION_SPRAY | Freq: Four times a day (QID) | RESPIRATORY_TRACT | Status: DC | PRN
  Filled 2011-03-17: qty 6.7

## 2011-03-17 MED ORDER — NICOTINE 21 MG/24HR TD PT24
21.0000 mg | MEDICATED_PATCH | Freq: Every day | TRANSDERMAL | Status: DC
  Administered 2011-03-17 – 2011-03-21 (×5): 21 mg via TRANSDERMAL
  Filled 2011-03-17 (×5): qty 1

## 2011-03-17 MED ORDER — ENOXAPARIN SODIUM 40 MG/0.4ML ~~LOC~~ SOLN
40.0000 mg | SUBCUTANEOUS | Status: DC
  Administered 2011-03-18 – 2011-03-21 (×5): 40 mg via SUBCUTANEOUS
  Filled 2011-03-17 (×4): qty 0.4

## 2011-03-17 MED ORDER — AMLODIPINE BESYLATE 10 MG PO TABS
10.0000 mg | ORAL_TABLET | Freq: Every day | ORAL | Status: DC
  Administered 2011-03-17 – 2011-03-21 (×5): 10 mg via ORAL
  Filled 2011-03-17 (×5): qty 1

## 2011-03-17 MED ORDER — ROCURONIUM BROMIDE 100 MG/10ML IV SOLN
INTRAVENOUS | Status: DC | PRN
  Administered 2011-03-17 (×2): 10 mg via INTRAVENOUS
  Administered 2011-03-17: 50 mg via INTRAVENOUS
  Administered 2011-03-17: 10 mg via INTRAVENOUS

## 2011-03-17 MED ORDER — FLUOXETINE HCL 20 MG PO CAPS
40.0000 mg | ORAL_CAPSULE | Freq: Every day | ORAL | Status: DC
  Administered 2011-03-17 – 2011-03-21 (×5): 40 mg via ORAL
  Filled 2011-03-17 (×5): qty 2

## 2011-03-17 MED ORDER — INSULIN ASPART 100 UNIT/ML ~~LOC~~ SOLN: 0.0000 [IU] | Freq: Every day | SUBCUTANEOUS | Status: DC

## 2011-03-17 MED ORDER — SODIUM CHLORIDE 0.9 % IR SOLN
Status: DC | PRN
  Administered 2011-03-17: 1000 mL

## 2011-03-17 MED ORDER — PENTOSAN POLYSULFATE SODIUM 100 MG PO CAPS
100.0000 mg | ORAL_CAPSULE | Freq: Three times a day (TID) | ORAL | Status: DC
  Administered 2011-03-17 – 2011-03-21 (×10): 100 mg via ORAL
  Filled 2011-03-17 (×15): qty 1

## 2011-03-17 MED ORDER — ONDANSETRON HCL 4 MG/2ML IJ SOLN: 4.0000 mg | Freq: Four times a day (QID) | INTRAMUSCULAR | Status: DC | PRN

## 2011-03-17 MED ORDER — HYDROCHLOROTHIAZIDE 25 MG PO TABS
25.0000 mg | ORAL_TABLET | ORAL | Status: DC
  Administered 2011-03-18 – 2011-03-21 (×3): 25 mg via ORAL
  Filled 2011-03-17 (×5): qty 1

## 2011-03-17 MED ORDER — DIPHENHYDRAMINE HCL 12.5 MG/5ML PO ELIX
12.5000 mg | ORAL_SOLUTION | Freq: Four times a day (QID) | ORAL | Status: DC | PRN
  Filled 2011-03-17: qty 5

## 2011-03-17 MED ORDER — INSULIN ASPART 100 UNIT/ML ~~LOC~~ SOLN: 6.0000 [IU] | Freq: Three times a day (TID) | SUBCUTANEOUS | Status: DC

## 2011-03-17 MED ORDER — SODIUM CHLORIDE 0.9 % IJ SOLN: 9.0000 mL | INTRAMUSCULAR | Status: DC | PRN

## 2011-03-17 MED ORDER — LACTATED RINGERS IV SOLN
INTRAVENOUS | Status: DC | PRN
  Administered 2011-03-17 (×2): via INTRAVENOUS

## 2011-03-17 MED ORDER — BUPIVACAINE-EPINEPHRINE 0.25% -1:200000 IJ SOLN
INTRAMUSCULAR | Status: DC | PRN
  Administered 2011-03-17: 10 mL

## 2011-03-17 MED ORDER — CEFAZOLIN SODIUM 1-5 GM-% IV SOLN
1.0000 g | Freq: Three times a day (TID) | INTRAVENOUS | Status: AC
  Administered 2011-03-17: 1 g via INTRAVENOUS
  Filled 2011-03-17: qty 50

## 2011-03-17 MED ORDER — HYDROMORPHONE HCL PF 1 MG/ML IJ SOLN
0.2500 mg | INTRAMUSCULAR | Status: DC | PRN
  Administered 2011-03-17 (×2): 0.5 mg via INTRAVENOUS

## 2011-03-17 MED ORDER — MONTELUKAST SODIUM 10 MG PO TABS
10.0000 mg | ORAL_TABLET | Freq: Every day | ORAL | Status: DC
  Administered 2011-03-17 – 2011-03-20 (×4): 10 mg via ORAL
  Filled 2011-03-17 (×5): qty 1

## 2011-03-17 MED ORDER — FLUTICASONE PROPIONATE 50 MCG/ACT NA SUSP
2.0000 | Freq: Every day | NASAL | Status: DC
  Administered 2011-03-17 – 2011-03-21 (×5): 2 via NASAL
  Filled 2011-03-17: qty 16

## 2011-03-17 MED ORDER — POLYVINYL ALCOHOL 1.4 % OP SOLN
1.0000 [drp] | OPHTHALMIC | Status: DC | PRN
  Filled 2011-03-17: qty 15

## 2011-03-17 MED ORDER — PROPOFOL 10 MG/ML IV EMUL
INTRAVENOUS | Status: DC | PRN
  Administered 2011-03-17: 180 mg via INTRAVENOUS

## 2011-03-17 MED ORDER — LISINOPRIL 20 MG PO TABS
20.0000 mg | ORAL_TABLET | Freq: Every day | ORAL | Status: DC
  Administered 2011-03-17 – 2011-03-20 (×4): 20 mg via ORAL
  Filled 2011-03-17 (×5): qty 1

## 2011-03-17 MED ORDER — GLYCOPYRROLATE 0.2 MG/ML IJ SOLN
INTRAMUSCULAR | Status: DC | PRN
  Administered 2011-03-17: .6 mg via INTRAVENOUS

## 2011-03-17 MED ORDER — LORATADINE 10 MG PO TABS
10.0000 mg | ORAL_TABLET | Freq: Every day | ORAL | Status: DC
  Administered 2011-03-17 – 2011-03-21 (×5): 10 mg via ORAL
  Filled 2011-03-17 (×5): qty 1

## 2011-03-17 MED ORDER — DROPERIDOL 2.5 MG/ML IJ SOLN: 0.6250 mg | INTRAMUSCULAR | Status: DC | PRN

## 2011-03-17 MED ORDER — INSULIN GLARGINE 100 UNIT/ML ~~LOC~~ SOLN
10.0000 [IU] | Freq: Every day | SUBCUTANEOUS | Status: DC
  Administered 2011-03-17 – 2011-03-18 (×2): 10 [IU] via SUBCUTANEOUS
  Filled 2011-03-17: qty 3

## 2011-03-17 MED ORDER — INSULIN ASPART 100 UNIT/ML ~~LOC~~ SOLN
6.0000 [IU] | Freq: Three times a day (TID) | SUBCUTANEOUS | Status: DC
  Administered 2011-03-17 – 2011-03-21 (×11): 6 [IU] via SUBCUTANEOUS

## 2011-03-17 MED ORDER — NORTRIPTYLINE HCL 25 MG PO CAPS
25.0000 mg | ORAL_CAPSULE | Freq: Every day | ORAL | Status: DC
  Administered 2011-03-17 – 2011-03-20 (×4): 25 mg via ORAL
  Filled 2011-03-17 (×5): qty 1

## 2011-03-17 MED ORDER — QUETIAPINE FUMARATE 100 MG PO TABS
100.0000 mg | ORAL_TABLET | Freq: Every day | ORAL | Status: DC
  Administered 2011-03-17 – 2011-03-20 (×4): 100 mg via ORAL
  Filled 2011-03-17 (×5): qty 1

## 2011-03-17 MED ORDER — CLOTRIMAZOLE 1 % EX CREA
1.0000 "application " | TOPICAL_CREAM | Freq: Two times a day (BID) | CUTANEOUS | Status: DC
  Administered 2011-03-17 – 2011-03-21 (×9): 1 via TOPICAL
  Filled 2011-03-17: qty 15

## 2011-03-17 MED ORDER — DOCUSATE SODIUM 100 MG PO CAPS
100.0000 mg | ORAL_CAPSULE | Freq: Two times a day (BID) | ORAL | Status: DC
  Administered 2011-03-17 – 2011-03-21 (×8): 100 mg via ORAL
  Filled 2011-03-17 (×7): qty 1

## 2011-03-17 MED ORDER — NALOXONE HCL 0.4 MG/ML IJ SOLN: 0.4000 mg | INTRAMUSCULAR | Status: DC | PRN

## 2011-03-17 MED ORDER — FENTANYL CITRATE 0.05 MG/ML IJ SOLN
INTRAMUSCULAR | Status: DC | PRN
  Administered 2011-03-17: 50 ug via INTRAVENOUS
  Administered 2011-03-17: 150 ug via INTRAVENOUS

## 2011-03-17 MED ORDER — ONDANSETRON HCL 4 MG PO TABS: 4.0000 mg | ORAL_TABLET | Freq: Four times a day (QID) | ORAL | Status: DC | PRN

## 2011-03-17 MED ORDER — HYDROMORPHONE 0.3 MG/ML IV SOLN
INTRAVENOUS | Status: DC
  Administered 2011-03-17: 11:00:00 via INTRAVENOUS
  Administered 2011-03-17: 0.6 mg via INTRAVENOUS
  Administered 2011-03-17: 0.3 mg via INTRAVENOUS
  Administered 2011-03-17: 2.4 mg via INTRAVENOUS
  Administered 2011-03-18: 0.3 mg via INTRAVENOUS
  Administered 2011-03-18: 7.5 mg via INTRAVENOUS
  Administered 2011-03-18: 0.3 mg via INTRAVENOUS
  Administered 2011-03-18: 0.6 mg via INTRAVENOUS
  Administered 2011-03-18: 0.3 mg via INTRAVENOUS
  Administered 2011-03-18: 0.9 mg via INTRAVENOUS
  Administered 2011-03-18 – 2011-03-19 (×2): 1.2 mg via INTRAVENOUS
  Administered 2011-03-19: 0.3 mg via INTRAVENOUS
  Administered 2011-03-19: 1.2 mg via INTRAVENOUS
  Administered 2011-03-19: 0.9 mg via INTRAVENOUS
  Administered 2011-03-19: 0.3 mg via INTRAVENOUS
  Administered 2011-03-19: 1.2 mg via INTRAVENOUS
  Administered 2011-03-20: 0.3 mg via INTRAVENOUS
  Administered 2011-03-20: 7.5 mg via INTRAVENOUS
  Administered 2011-03-20: 1.2 mg via INTRAVENOUS
  Filled 2011-03-17: qty 25

## 2011-03-17 MED ORDER — METOPROLOL TARTRATE 50 MG PO TABS
150.0000 mg | ORAL_TABLET | Freq: Two times a day (BID) | ORAL | Status: DC
  Administered 2011-03-17 – 2011-03-21 (×7): 150 mg via ORAL
  Filled 2011-03-17 (×10): qty 1

## 2011-03-17 MED ORDER — ONDANSETRON HCL 4 MG/2ML IJ SOLN
INTRAMUSCULAR | Status: DC | PRN
  Administered 2011-03-17 (×2): 4 mg via INTRAVENOUS

## 2011-03-17 MED ORDER — DIPHENHYDRAMINE HCL 50 MG/ML IJ SOLN: 12.5000 mg | Freq: Four times a day (QID) | INTRAMUSCULAR | Status: DC | PRN

## 2011-03-17 MED ORDER — INSULIN ASPART 100 UNIT/ML ~~LOC~~ SOLN
0.0000 [IU] | Freq: Three times a day (TID) | SUBCUTANEOUS | Status: DC
  Filled 2011-03-17: qty 3

## 2011-03-17 MED ORDER — NICOTINE POLACRILEX 2 MG MT GUM
2.0000 mg | CHEWING_GUM | OROMUCOSAL | Status: DC | PRN
  Filled 2011-03-17: qty 1

## 2011-03-17 MED ORDER — MIDAZOLAM HCL 5 MG/5ML IJ SOLN
INTRAMUSCULAR | Status: DC | PRN
  Administered 2011-03-17: 2 mg via INTRAVENOUS

## 2011-03-17 MED ORDER — INSULIN ASPART 100 UNIT/ML ~~LOC~~ SOLN
0.0000 [IU] | Freq: Three times a day (TID) | SUBCUTANEOUS | Status: DC
  Administered 2011-03-17: 11 [IU] via SUBCUTANEOUS
  Administered 2011-03-17: 7 [IU] via SUBCUTANEOUS
  Administered 2011-03-18: 3 [IU] via SUBCUTANEOUS
  Administered 2011-03-18: 7 [IU] via SUBCUTANEOUS
  Administered 2011-03-18: 4 [IU] via SUBCUTANEOUS
  Administered 2011-03-19: 7 [IU] via SUBCUTANEOUS
  Administered 2011-03-19 – 2011-03-20 (×5): 4 [IU] via SUBCUTANEOUS
  Administered 2011-03-21: 3 [IU] via SUBCUTANEOUS

## 2011-03-17 SURGICAL SUPPLY — 47 items
APPLIER CLIP LOGIC TI 5 (MISCELLANEOUS) IMPLANT
APPLIER CLIP ROT 10 11.4 M/L (STAPLE)
BINDER ABD UNIV 12 45-62 (WOUND CARE) ×1 IMPLANT
BINDER ABDOMINAL 46IN 62IN (WOUND CARE) ×2
BLADE SURG ROTATE 9660 (MISCELLANEOUS) IMPLANT
CANISTER SUCTION 2500CC (MISCELLANEOUS) IMPLANT
CHLORAPREP W/TINT 26ML (MISCELLANEOUS) ×2 IMPLANT
CLIP APPLIE ROT 10 11.4 M/L (STAPLE) IMPLANT
CLOTH BEACON ORANGE TIMEOUT ST (SAFETY) ×2 IMPLANT
COVER SURGICAL LIGHT HANDLE (MISCELLANEOUS) ×2 IMPLANT
DECANTER SPIKE VIAL GLASS SM (MISCELLANEOUS) IMPLANT
DERMABOND ADVANCED (GAUZE/BANDAGES/DRESSINGS) ×1
DERMABOND ADVANCED .7 DNX12 (GAUZE/BANDAGES/DRESSINGS) ×1 IMPLANT
DEVICE SECURE STRAP 25 ABSORB (INSTRUMENTS) ×4 IMPLANT
DEVICE TROCAR PUNCTURE CLOSURE (ENDOMECHANICALS) ×2 IMPLANT
DRAPE UTILITY 15X26 W/TAPE STR (DRAPE) ×4 IMPLANT
ELECT REM PT RETURN 9FT ADLT (ELECTROSURGICAL) ×2
ELECTRODE REM PT RTRN 9FT ADLT (ELECTROSURGICAL) ×1 IMPLANT
FILTER SMOKE EVAC LAPAROSHD (FILTER) ×2 IMPLANT
GLOVE BIO SURGEON STRL SZ7 (GLOVE) IMPLANT
GLOVE BIOGEL PI IND STRL 7.0 (GLOVE) ×1 IMPLANT
GLOVE BIOGEL PI IND STRL 7.5 (GLOVE) ×2 IMPLANT
GLOVE BIOGEL PI INDICATOR 7.0 (GLOVE) ×1
GLOVE BIOGEL PI INDICATOR 7.5 (GLOVE) ×2
GLOVE SURG SS PI 7.0 STRL IVOR (GLOVE) ×4 IMPLANT
GLOVE SURG SS PI 7.5 STRL IVOR (GLOVE) ×8 IMPLANT
GOWN STRL NON-REIN LRG LVL3 (GOWN DISPOSABLE) ×8 IMPLANT
KIT BASIN OR (CUSTOM PROCEDURE TRAY) ×2 IMPLANT
KIT ROOM TURNOVER OR (KITS) ×2 IMPLANT
MESH PHYSIO OVAL 20X25CM (Mesh General) ×2 IMPLANT
NEEDLE SPNL 22GX3.5 QUINCKE BK (NEEDLE) ×2 IMPLANT
NS IRRIG 1000ML POUR BTL (IV SOLUTION) ×2 IMPLANT
PEN SKIN MARKING BROAD (MISCELLANEOUS) ×2 IMPLANT
SCALPEL HARMONIC ACE (MISCELLANEOUS) ×2 IMPLANT
SCISSORS LAP 5X35 DISP (ENDOMECHANICALS) IMPLANT
SET IRRIG TUBING LAPAROSCOPIC (IRRIGATION / IRRIGATOR) IMPLANT
SLEEVE ENDOPATH XCEL 5M (ENDOMECHANICALS) ×6 IMPLANT
SUT MNCRL AB 4-0 PS2 18 (SUTURE) ×2 IMPLANT
SUT NOVA NAB GS-21 0 18 T12 DT (SUTURE) ×4 IMPLANT
TOWEL OR 17X24 6PK STRL BLUE (TOWEL DISPOSABLE) ×2 IMPLANT
TOWEL OR 17X26 10 PK STRL BLUE (TOWEL DISPOSABLE) ×2 IMPLANT
TRAY FOLEY CATH 14FRSI W/METER (CATHETERS) IMPLANT
TRAY LAPAROSCOPIC (CUSTOM PROCEDURE TRAY) ×2 IMPLANT
TROCAR XCEL BLUNT TIP 100MML (ENDOMECHANICALS) IMPLANT
TROCAR XCEL NON-BLD 11X100MML (ENDOMECHANICALS) ×2 IMPLANT
TROCAR XCEL NON-BLD 5MMX100MML (ENDOMECHANICALS) ×2 IMPLANT
WATER STERILE IRR 1000ML POUR (IV SOLUTION) IMPLANT

## 2011-03-17 NOTE — Anesthesia Postprocedure Evaluation (Signed)
Anesthesia Post Note  Patient: [name] [name]  Procedure(s) Performed:  LAPAROSCOPIC VENTRAL HERNIA  Anesthesia type: General  Patient location: PACU  Post pain: Pain level controlled  Post assessment: Patient's Cardiovascular Status Stable  Last Vitals:  Filed Vitals:   03/17/11 1055  BP:   Pulse:   Temp:   Resp: 17    Post vital signs: Reviewed and stable  Level of consciousness: sedated  Complications: No apparent anesthesia complications

## 2011-03-17 NOTE — Op Note (Signed)
Laparoscopic Ventral Hernia Repair Procedure Note Laparoscopic lysis of adhesions - 60 minutes Indications: Symptomatic ventral hernia  Pre-operative Diagnosis: ventral hernia  Post-operative Diagnosis: ventral hernia  Surgeon: Birdia Jaycox K.   Assistants: None   Anesthesia: General endotracheal anesthesia  ASA Class: 3  Procedure Details  The patient was seen in the Holding Room. The risks, benefits, complications, treatment options, and expected outcomes were discussed with the patient. The possibilities of reaction to medication, pulmonary aspiration, perforation of viscus, bleeding, recurrent infection, the need for additional procedures, failure to diagnose a condition, and creating a complication requiring transfusion or operation were discussed with the patient. The patient concurred with the proposed plan, giving informed consent.  The site of surgery properly noted/marked. The patient was taken to the operating room, identified as [name] and the procedure verified as laparoscopic ventral hernia repair with mesh. A Time Out was held and the above information confirmed.    The patient was placed supine.  After establishing general anesthesia, a Foley catheter was placed.  The abdomen was prepped with Chloraprep and draped in standard fashion.  A 5 mm Optiview was used the cannulate the peritoneal cavity in the left upper quadrant below the costal margin.  Pneumoperitoneum was obtained by insufflating CO2, maintaining a maximum pressure of 15 mmHg.  The 5 mm 30-degree laparoscopic was inserted.  There were significant omental adhesions to the anterior abdominal wall in and around the hernia defect.  An 11-mm port was placed in the left anterior axillary line at the level of the umbilicus.  Another 5-mm port was placed in the left lower quadrant.  Harmonic scalpel and gentle traction were used to dissect the omental adhesions away from the anterior abdominal wall.  Adhesiolysis  took about 60 minutes.  We cleared the entire abdominal wall and were able to visualize two fascial defects. We used a spinal needle to identify the extent of the hernia defects.  This covered an area of about 15 cms.  We selected a 25 x 20 cm piece of Physio mesh.  We placed eight stay sutures of 0 Novofil around the edges of the mesh.  The mesh was then rolled up and inserted through the 11 mm port site.  The mesh was then unrolled.  The stay sutures were then pulled up through small stab incisions using the Endo-close device.  This deployed the mesh widely over the fascial defects.  The stay sutures were then tied down.  The Secure Strap device was then used to tack down the edges of the mesh at 1 cm intervals circumferentially. We placed two additional 5 mm port sites on the right to allow proper placement of the tacks. We placed a few tacks inside the outer ring of tacks.  We inspected for hemostasis.  The fascial defect at the 11 mm port site was covered by the mesh.  Pneumoperitoneum was then released as we removed the remainder of the trocars.  The port sites were closed with 4-0 Monocryl.  All of the incisions and stay suture sites were then sealed with Dermabond.  An abdominal binder was placed around the patient's abdomen.  The patient was extubated and brought to the recovery room in stable condition.  All sponge, instrument, and needle counts were correct prior to closure and at the conclusion of the case.   Findings: Umbilical defect - 3 cm; epigastric defect - 4 cm   Estimated Blood Loss:  less than 50 mL  Complications:  None; patient tolerated the procedure well.         Disposition: PACU - hemodynamically stable.         Condition: stable

## 2011-03-17 NOTE — Transfer of Care (Signed)
Immediate Anesthesia Transfer of Care Note  Patient: [name] [name]  Procedure(s) Performed:  LAPAROSCOPIC VENTRAL HERNIA  Patient Location: PACU  Anesthesia Type: General  Level of Consciousness: awake, alert , oriented and patient cooperative  Airway & Oxygen Therapy: Patient Spontanous Breathing and Patient connected to face mask oxygen  Post-op Assessment: Report given to PACU RN, Post -op Vital signs reviewed and stable and Patient moving all extremities  Post vital signs: Reviewed and stable  Complications: No apparent anesthesia complications

## 2011-03-17 NOTE — Anesthesia Preprocedure Evaluation (Signed)
Anesthesia Evaluation  Patient identified by MRN, date of birth, ID band  Reviewed: Allergy & Precautions, H&P , NPO status , Patient's Chart, lab work & pertinent test results  Airway       Dental   Pulmonary asthma , pneumonia ,  clear to auscultation        Cardiovascular hypertension, Regular Normal+ Systolic murmurs    Neuro/Psych  Headaches, PSYCHIATRIC DISORDERS  Neuromuscular disease CVA    GI/Hepatic GERD-  ,  Endo/Other  Diabetes mellitus-  Renal/GU      Musculoskeletal  (+) Fibromyalgia -  Abdominal   Peds  Hematology   Anesthesia Other Findings   Reproductive/Obstetrics                           Anesthesia Physical Anesthesia Plan  ASA: III  Anesthesia Plan: General   Post-op Pain Management:    Induction: Intravenous  Airway Management Planned: Oral ETT  Additional Equipment:   Intra-op Plan:   Post-operative Plan: Extubation in OR  Informed Consent: I have reviewed the patients History and Physical, chart, labs and discussed the procedure including the risks, benefits and alternatives for the proposed anesthesia with the patient or authorized representative who has indicated his/her understanding and acceptance.     Plan Discussed with: CRNA and Surgeon  Anesthesia Plan Comments:         Anesthesia Quick Evaluation

## 2011-03-18 ENCOUNTER — Encounter (HOSPITAL_COMMUNITY): Payer: Self-pay | Admitting: Surgery

## 2011-03-18 LAB — COMPREHENSIVE METABOLIC PANEL
ALT: 48 U/L — ABNORMAL HIGH (ref 0–35)
AST: 58 U/L — ABNORMAL HIGH (ref 0–37)
Albumin: 3.1 g/dL — ABNORMAL LOW (ref 3.5–5.2)
Alkaline Phosphatase: 110 U/L (ref 39–117)
BUN: 15 mg/dL (ref 6–23)
CO2: 25 mEq/L (ref 19–32)
Calcium: 8.2 mg/dL — ABNORMAL LOW (ref 8.4–10.5)
Chloride: 100 mEq/L (ref 96–112)
Creatinine, Ser: 0.8 mg/dL (ref 0.50–1.10)
GFR calc Af Amer: 90 mL/min — ABNORMAL LOW (ref >90)
GFR calc non Af Amer: 77 mL/min — ABNORMAL LOW (ref >90)
Glucose, Bld: 236 mg/dL — ABNORMAL HIGH (ref 70–99)
Potassium: 5.2 mEq/L — ABNORMAL HIGH (ref 3.5–5.1)
Sodium: 135 mEq/L (ref 135–145)
Total Bilirubin: 0.4 mg/dL (ref 0.3–1.2)
Total Protein: 6.8 g/dL (ref 6.0–8.3)

## 2011-03-18 LAB — GLUCOSE, CAPILLARY
Glucose-Capillary: 255 mg/dL — ABNORMAL HIGH (ref 70–99)
Glucose-Capillary: 235 mg/dL — ABNORMAL HIGH (ref 70–99)
Glucose-Capillary: 176 mg/dL — ABNORMAL HIGH (ref 70–99)
Glucose-Capillary: 186 mg/dL — ABNORMAL HIGH (ref 70–99)

## 2011-03-18 LAB — CBC
HCT: 34.8 % — ABNORMAL LOW (ref 36.0–46.0)
Hemoglobin: 10.8 g/dL — ABNORMAL LOW (ref 12.0–15.0)
MCH: 27.7 pg (ref 26.0–34.0)
MCHC: 31 g/dL (ref 30.0–36.0)
MCV: 89.2 fL (ref 78.0–100.0)
Platelets: DECREASED 10*3/uL (ref 150–400)
RBC: 3.9 MIL/uL (ref 3.87–5.11)
RDW: 14.1 % (ref 11.5–15.5)
WBC: 8.1 10*3/uL (ref 4.0–10.5)

## 2011-03-18 MED ORDER — KETOROLAC TROMETHAMINE 30 MG/ML IJ SOLN
30.0000 mg | Freq: Four times a day (QID) | INTRAMUSCULAR | Status: DC
  Administered 2011-03-18 – 2011-03-21 (×9): 30 mg via INTRAVENOUS
  Filled 2011-03-18 (×19): qty 1

## 2011-03-18 MED ORDER — SODIUM CHLORIDE 0.45 % IV SOLN
INTRAVENOUS | Status: DC
  Administered 2011-03-18 – 2011-03-19 (×3): via INTRAVENOUS

## 2011-03-18 MED ORDER — WHITE PETROLATUM GEL
Status: AC
  Administered 2011-03-18: 18:00:00
  Filled 2011-03-18: qty 5

## 2011-03-18 MED ORDER — HYDROMORPHONE 0.3 MG/ML IV SOLN
INTRAVENOUS | Status: AC
  Administered 2011-03-18: 7.5 mg via INTRAVENOUS
  Filled 2011-03-18: qty 25

## 2011-03-18 NOTE — Progress Notes (Signed)
1 Day Post-Op s/p laparoscopic LOA/ ventral hernia repair with mesh Subjective: The patient is very sore, but sitting up in a chair.  Using abdominal binder  Objective: Vital signs in last 24 hours: Temp:  [97.2 F (36.2 C)-99.9 F (37.7 C)] 99.9 F (37.7 C) (11/27 1000) Pulse Rate:  [81-91] 85  (11/27 1000) Resp:  [12-20] 18  (11/27 1000) BP: (102-134)/(53-84) 115/53 mmHg (11/27 1000) SpO2:  [92 %-100 %] 92 % (11/27 1000) Weight:  [213 lb 3 oz (96.7 kg)] 213 lb 3 oz (96.7 kg) (11/26 1300) Last BM Date: 03/12/11  Intake/Output from previous day: 11/26 0701 - 11/27 0700 In: 3048.8 [P.O.:240; I.V.:2808.8] Out: 775 [Urine:750; Blood:25] Intake/Output this shift: Total I/O In: 120 [P.O.:120] Out: -   General appearance: alert and no distress Resp: clear to auscultation bilaterally GI: Mildly distended, hypoactive bowel sounds Incision/Wound:  Port sites/ stay suture sites clean, dray  Lab Results:   Basename 03/18/11 0618 03/17/11 1756  WBC 8.1 9.5  HGB 10.8* 11.4*  HCT 34.8* 36.4  PLT PLATELET CLUMPS NOTED ON SMEAR, COUNT APPEARS DECREASED 144*   BMET  Basename 03/18/11 0618 03/17/11 1756  NA 135 --  K 5.2* --  CL 100 --  CO2 25 --  GLUCOSE 236* --  BUN 15 --  CREATININE 0.80 0.83  CALCIUM 8.2* --   PT/INR No results found for this basename: LABPROT:2,INR:2 in the last 72 hours ABG No results found for this basename: PHART:2,PCO2:2,PO2:2,HCO3:2 in the last 72 hours  Studies/Results: No results found.  Anti-infectives: Anti-infectives     Start     Dose/Rate Route Frequency Ordered Stop   03/17/11 1400   ceFAZolin (ANCEF) IVPB 1 g/50 mL premix        1 g 100 mL/hr over 30 Minutes Intravenous 3 times per day 03/17/11 1206 03/17/11 1528   03/16/11 1145   ceFAZolin (ANCEF) IVPB 2 g/50 mL premix        2 g 100 mL/hr over 30 Minutes Intravenous 60 min pre-op 03/16/11 1138 03/17/11 0745          Assessment/Plan: s/p Procedure(s): LAPAROSCOPIC VENTRAL  HERNIA Encourage ambulation; encourage incentive spirometer Change IV fluids to 1/2 NS Recheck labs in AM Toradol  LOS: 1 day    [name] [name] K. 03/18/2011

## 2011-03-18 NOTE — Progress Notes (Signed)
Pt has not voided post-op.  Performed bladder scan, 248 cc in bladder.  IV fluids running at 100cc/hr.  Notified MD on call, Dr Lindie Spruce, who ordered an in and out cath X 1.  Will continue to monitor.

## 2011-03-18 NOTE — Progress Notes (Signed)
Inpatient Diabetes Program Recommendations  AACE/ADA: New Consensus Statement on Inpatient Glycemic Control (2009)  Target Ranges:  Prepandial:   less than 140 mg/dL      Peak postprandial:   less than 180 mg/dL (1-2 hours)      Critically ill patients:  140 - 180 mg/dL   Reason for Visit: Elevated fasting glucose: 236 mg/dL and elevated [id number]: 04.5%  Inpatient Diabetes Program Recommendations Insulin - Basal: Increase Lantus to home dose Outpatient Referral: Please order outpatient diabetes education  Note: Please have patient follow up with PCP concerning elevated A1C

## 2011-03-18 NOTE — Progress Notes (Signed)
In and out cath performed per prior order.  500cc of urine out-pt tolerated well.

## 2011-03-19 LAB — GLUCOSE, CAPILLARY
Glucose-Capillary: 173 mg/dL — ABNORMAL HIGH (ref 70–99)
Glucose-Capillary: 195 mg/dL — ABNORMAL HIGH (ref 70–99)
Glucose-Capillary: 231 mg/dL — ABNORMAL HIGH (ref 70–99)
Glucose-Capillary: 200 mg/dL — ABNORMAL HIGH (ref 70–99)

## 2011-03-19 LAB — CBC
HCT: 34.4 % — ABNORMAL LOW (ref 36.0–46.0)
Hemoglobin: 11.3 g/dL — ABNORMAL LOW (ref 12.0–15.0)
MCH: 28.8 pg (ref 26.0–34.0)
MCHC: 32.8 g/dL (ref 30.0–36.0)
MCV: 87.5 fL (ref 78.0–100.0)
Platelets: 112 10*3/uL — ABNORMAL LOW (ref 150–400)
RBC: 3.93 MIL/uL (ref 3.87–5.11)
RDW: 13.4 % (ref 11.5–15.5)
WBC: 6.6 10*3/uL (ref 4.0–10.5)

## 2011-03-19 MED ORDER — TAMSULOSIN HCL 0.4 MG PO CAPS
0.4000 mg | ORAL_CAPSULE | ORAL | Status: DC
  Administered 2011-03-19: 0.4 mg via ORAL
  Filled 2011-03-19 (×3): qty 1

## 2011-03-19 MED ORDER — INSULIN GLARGINE 100 UNIT/ML ~~LOC~~ SOLN
15.0000 [IU] | SUBCUTANEOUS | Status: DC
  Administered 2011-03-19 – 2011-03-21 (×3): 15 [IU] via SUBCUTANEOUS

## 2011-03-19 MED ORDER — POLYETHYLENE GLYCOL 3350 17 G PO PACK
17.0000 g | PACK | Freq: Two times a day (BID) | ORAL | Status: DC
  Administered 2011-03-19 – 2011-03-21 (×5): 17 g via ORAL
  Filled 2011-03-19 (×7): qty 1

## 2011-03-19 MED ORDER — SODIUM CHLORIDE 0.9 % IV BOLUS (SEPSIS)
500.0000 mL | Freq: Once | INTRAVENOUS | Status: AC
  Administered 2011-03-19: 500 mL via INTRAVENOUS
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
## 2011-03-19 NOTE — Progress Notes (Signed)
2 Days Post-Op  Subjective: Patient still unable to void - requiring I/O cath.  Patient had difficulty with urination prior to surgery.  She sees Dr. Brunilda Payor on a regular basis.  No bowel movements yet.  Pain is slightly better than yesterday with Toradol.  Patient still on clear liquids because she has no appetite.  No complaints of nausea  Objective: Vital signs in last 24 hours: Temp:  [98 F (36.7 C)-99.9 F (37.7 C)] 98 F (36.7 C) (11/28 0600) Pulse Rate:  [74-85] 84  (11/28 0600) Resp:  [16-21] 16  (11/28 0600) BP: (80-115)/(37-53) 105/50 mmHg (11/28 0600) SpO2:  [92 %-100 %] 97 % (11/28 0600) FiO2 (%):  [93 %] 93 % (11/27 1644) Last BM Date: 03/12/11  Intake/Output from previous day: 11/27 0701 - 11/28 0700 In: 360 [P.O.:360] Out: 1600 [Urine:1600] Intake/Output this shift:    General appearance: alert and mild distress Resp: clear to auscultation bilaterally GI: normal findings: bowel sounds normal Incisions - clean, dry, no sign of infection  Lab Results:   [facility] 03/18/11 0618 03/17/11 1756  WBC 8.1 9.5  HGB 10.8* 11.4*  HCT 34.8* 36.4  PLT PLATELET CLUMPS NOTED ON SMEAR, COUNT APPEARS DECREASED 144*   BMET  Basename 03/18/11 0618 03/17/11 1756  NA 135 --  K 5.2* --  CL 100 --  CO2 25 --  GLUCOSE 236* --  BUN 15 --  CREATININE 0.80 0.83  CALCIUM 8.2* --   PT/INR No results found for this basename: LABPROT:2,INR:2 in the last 72 hours ABG No results found for this basename: PHART:2,PCO2:2,PO2:2,HCO3:2 in the last 72 hours  Studies/Results: No results found.  Anti-infectives: Anti-infectives     Start     Dose/Rate Route Frequency Ordered Stop   03/17/11 1400   ceFAZolin (ANCEF) IVPB 1 g/50 mL premix        1 g 100 mL/hr over 30 Minutes Intravenous 3 times per day 03/17/11 1206 03/17/11 1528   03/16/11 1145   ceFAZolin (ANCEF) IVPB 2 g/50 mL premix        2 g 100 mL/hr over 30 Minutes Intravenous 60 min pre-op 03/16/11 1138 03/17/11 0745            Assessment/Plan: s/p Procedure(s): LAPAROSCOPIC VENTRAL HERNIA Replace Foley due to urinary retention - Consult [facility] - history of cystitis with long history of urinary difficulty Dulcolax suppositories for constipation - chronic problem Advance diet Encourage ambulation  LOS: 2 days    Mattix Imhof K. 03/19/2011

## 2011-03-20 LAB — GLUCOSE, CAPILLARY
Glucose-Capillary: 176 mg/dL — ABNORMAL HIGH (ref 70–99)
Glucose-Capillary: 165 mg/dL — ABNORMAL HIGH (ref 70–99)
Glucose-Capillary: 191 mg/dL — ABNORMAL HIGH (ref 70–99)
Glucose-Capillary: 145 mg/dL — ABNORMAL HIGH (ref 70–99)

## 2011-03-20 MED ORDER — SODIUM CHLORIDE 0.9 % IJ SOLN: 3.0000 mL | INTRAMUSCULAR | Status: DC | PRN

## 2011-03-20 MED ORDER — HYDROMORPHONE HCL PF 1 MG/ML IJ SOLN: 1.0000 mg | INTRAMUSCULAR | Status: DC | PRN

## 2011-03-20 MED ORDER — OXYCODONE-ACETAMINOPHEN 5-325 MG PO TABS
1.0000 | ORAL_TABLET | ORAL | Status: DC | PRN
  Administered 2011-03-20 – 2011-03-21 (×4): 1 via ORAL
  Filled 2011-03-20 (×4): qty 1

## 2011-03-20 NOTE — Progress Notes (Signed)
3 Days Post-Op  Subjective: Appreciate Dr. Madilyn Hook assistance with her voiding difficulty.  On Flomax.  Voiding trial this AM.  Still no bowel movement - on Miralax  Objective: Vital signs in last 24 hours: Temp:  [97.7 F (36.5 C)-98.5 F (36.9 C)] 97.7 F (36.5 C) (11/29 0600) Pulse Rate:  [74-93] 80  (11/29 0600) Resp:  [16-20] 16  (11/29 0600) BP: (95-124)/(48-70) 113/68 mmHg (11/29 0600) SpO2:  [93 %-100 %] 96 % (11/29 0600) Last BM Date: 03/03/11  Intake/Output from previous day: 11/28 0701 - 11/29 0700 In: 2722 [P.O.:600; I.V.:2122] Out: 2600 [Urine:2600] Intake/Output this shift:    General appearance: alert and no distress Resp: clear to auscultation bilaterally Cardio: regular rate and rhythm, S1, S2 normal, no murmur, click, rub or gallop GI: active bowel sounds; incisional tenderness Incision/Wound:  Incisions clean, dry  Lab Results:   [facility] 03/19/11 0842 03/18/11 0618  WBC 6.6 8.1  HGB 11.3* 10.8*  HCT 34.4* 34.8*  PLT 112* PLATELET CLUMPS NOTED ON SMEAR, COUNT APPEARS DECREASED   BMET  Basename 03/18/11 0618 03/17/11 1756  NA 135 --  K 5.2* --  CL 100 --  CO2 25 --  GLUCOSE 236* --  BUN 15 --  CREATININE 0.80 0.83  CALCIUM 8.2* --   PT/INR No results found for this basename: LABPROT:2,INR:2 in the last 72 hours ABG No results found for this basename: PHART:2,PCO2:2,PO2:2,HCO3:2 in the last 72 hours  Studies/Results: No results found.  Anti-infectives: Anti-infectives     Start     Dose/Rate Route Frequency Ordered Stop   03/17/11 1400   ceFAZolin (ANCEF) IVPB 1 g/50 mL premix        1 g 100 mL/hr over 30 Minutes Intravenous 3 times per day 03/17/11 1206 03/17/11 1528   03/16/11 1145   ceFAZolin (ANCEF) IVPB 2 g/50 mL premix        2 g 100 mL/hr over 30 Minutes Intravenous 60 min pre-op 03/16/11 1138 03/17/11 0745          Assessment/Plan: s/p Procedure(s): LAPAROSCOPIC VENTRAL HERNIA Voiding trial per Urology PO Pain  meds Saline lock IV Hopefully home in next 1-2 days  LOS: 3 days    Glenn Christo K. 03/20/2011

## 2011-03-21 LAB — GLUCOSE, CAPILLARY: Glucose-Capillary: 139 mg/dL — ABNORMAL HIGH (ref 70–99)

## 2011-03-21 MED ORDER — OXYCODONE-ACETAMINOPHEN 5-325 MG PO TABS: 1.0000 | ORAL_TABLET | ORAL | Status: AC | PRN

## 2011-03-21 NOTE — Discharge Summary (Signed)
Physician Discharge Summary  Patient ID: [name] [name] MRN: [id number] DOB/AGE: 1950-09-26 60 y.o.  Admit date: 03/17/2011 Discharge date: 03/21/2011  Admission Diagnoses:  Discharge Diagnoses:  Active Problems:  * No active hospital problems. *    Discharged Condition: good  Hospital Course:  Laparoscopic ventral hernia repair on 11/26.  Post-op course complicated by inadequate pain control, but this improved with the addition of Toradol.  She had some voiding difficulty, but Dr. Brunilda Payor from urology prescribed Flomax which has helped.  Her pain is controlled now with PO pain meds.  She is ambulating and tolerating a diet.  Consults: urology  Significant Diagnostic Studies: none   Treatments: surgery: laparoscopic ventral hernia repair with mesh  Discharge Exam: Blood pressure 120/59, pulse 84, temperature 99.3 F (37.4 C), temperature source Oral, resp. rate 20, height 5\' 3"  (1.6 m), weight 213 lb 3 oz (96.7 kg), SpO2 95.00%. Incision/Wound:incisions healing well.  No sign of infection. No recurrent hernia  Disposition: Home or Self Care  Discharge Orders    Future Orders Please Complete By Expires   Diet general      Increase activity slowly      May walk up steps      May shower / Bathe      Driving Restrictions      Comments:   Do not drive while taking pain medications   Call MD for:  temperature >100.4      Call MD for:  persistant nausea and vomiting      Call MD for:  severe uncontrolled pain      Call MD for:  redness, tenderness, or signs of infection (pain, swelling, redness, odor or green/yellow discharge around incision site)        Current Discharge Medication List    START taking these medications   Details  oxyCODONE-acetaminophen (PERCOCET) 5-325 MG per tablet Take 1 tablet by mouth every 4 (four) hours as needed. Qty: 30 tablet, Refills: 0      CONTINUE these medications which have NOT CHANGED   Details  albuterol (PROVENTIL HFA;VENTOLIN HFA)  108 (90 BASE) MCG/ACT inhaler Inhale 2 puffs into the lungs every 6 (six) hours as needed. For wheezing.     amLODipine (NORVASC) 10 MG tablet Take 1 tablet (10 mg total) by mouth daily. Qty: 30 tablet, Refills: 11   Associated Diagnoses: Essential hypertension, benign    cetirizine (ZYRTEC) 10 MG tablet Take 10 mg by mouth daily as needed. allergies    clotrimazole (LOTRIMIN) 1 % cream Apply 1 application topically 2 (two) times daily. Apply to feet twice daily for 4 weeks     esomeprazole (NEXIUM) 40 MG capsule Take 1 capsule (40 mg total) by mouth daily before breakfast. Qty: 30 capsule, Refills: 6    FLUoxetine (PROZAC) 40 MG capsule Take 40 mg by mouth daily.     gabapentin (NEURONTIN) 100 MG capsule Take 100 mg by mouth 3 (three) times daily.      hydrochlorothiazide 25 MG tablet Take 25 mg by mouth every morning.     Hypromellose (ARTIFICIAL TEARS OP) Place 1 drop into both eyes daily as needed. 1 drop in each eye as needed for dry eye    insulin glargine (LANTUS) 100 UNIT/ML injection Inject 15 Units into the skin every morning.     lisinopril (PRINIVIL,ZESTRIL) 20 MG tablet Take 1 tablet (20 mg total) by mouth daily. Qty: 60 tablet, Refills: 6   Associated Diagnoses: HTN (hypertension)    metoprolol tartrate (LOPRESSOR)  50 MG tablet Take 3 tablets (150 mg total) by mouth 2 (two) times daily. Qty: 200 tablet, Refills: 6    montelukast (SINGULAIR) 10 MG tablet Take 1 tablet (10 mg total) by mouth at bedtime. Qty: 30 tablet, Refills: 2   Associated Diagnoses: Allergic rhinitis    nystatin (MYCOSTATIN) cream Apply 1 application topically 2 (two) times daily as needed. rash    ondansetron (ZOFRAN) 4 MG tablet Take 4 mg by mouth every 8 (eight) hours as needed. nausea     pentosan polysulfate (ELMIRON) 100 MG capsule Take 100 mg by mouth 3 (three) times daily before meals.      phenazopyridine (PYRIDIUM) 200 MG tablet Take 200 mg by mouth every 6 (six) hours as needed. For  bladder pain.     polyethylene glycol (GLYCOLAX) packet Take 17 g by mouth 2 (two) times daily. Mix in 4-6 oz of water    pregabalin (LYRICA) 50 MG capsule Take 1 capsule (50 mg total) by mouth 3 (three) times daily. Qty: 90 capsule, Refills: 3   Associated Diagnoses: Neuropathy    aspirin EC 81 MG tablet Take 81 mg by mouth daily.      docusate sodium (COLACE) 100 MG capsule Take 100 mg by mouth 2 (two) times daily.     fluticasone (FLONASE) 50 MCG/ACT nasal spray Place 2 sprays into the nose daily. Both nostrils    glipiZIDE (GLUCOTROL) 5 MG 24 hr tablet TAKE 1 TABLET BY MOUTH ONCE DAILY Qty: 30 tablet, Refills: PRN    glucose blood (ONE TOUCH TEST STRIPS) test strip Check blood sugar twice daily. Dispense QS     hydrocortisone (ANUSOL-HC) 2.5 % rectal cream Place 1 application rectally as directed. Per box instructions    hydrocortisone 1 % cream Apply to affected area 2 times daily Qty: 30 g, Refills: 1   Associated Diagnoses: Rash    Insulin Pen Needle (BD ULTRA-FINE PEN NEEDLES) 29G X 12.7MM MISC Dispense one box QS for once daily insulin injection. Qty: 1 each, Refills: 11   Associated Diagnoses: Type II or unspecified type diabetes mellitus without mention of complication, not stated as uncontrolled    metoCLOPramide (REGLAN) 5 MG tablet Take 5 mg by mouth daily.     nicotine polacrilex (NICORETTE) 2 MG gum Take 2 mg by mouth daily as needed. Use as needed for craving.   Use up to 10 pieces per day.    nortriptyline (PAMELOR) 25 MG capsule Take 1 capsule (25 mg total) by mouth at bedtime. Qty: 30 capsule, Refills: 2   Associated Diagnoses: Chronic pain syndrome    ONETOUCH DELICA LANCETS MISC Test blood sugar 2 times daily. Dispense QS     prednisoLONE acetate (PRED FORTE) 1 % ophthalmic suspension Place 1 drop into both eyes 2 (two) times daily.     QUEtiapine (SEROQUEL) 200 MG tablet Take 100 mg by mouth at bedtime. Dose is 1/2 tablet (100mg ) of 200mg  tablet        STOP taking these medications     aspirin 500 MG tablet      HYDROmorphone (DILAUDID) 3 MG suppository        Follow-up Information    Follow up with Nikiya Starn K., MD. Make an appointment in 3 weeks.   Contact information:   [facility] [address], [address] [place] [address] [phone]          Signed: Wynona Luna. 03/21/2011, 11:05 AM

## 2011-03-21 NOTE — Progress Notes (Signed)
4 Days Post-Op  Subjective  Feels much better; voiding well; flatus but no BM  Objective: Vital signs in last 24 hours: Temp:  [98.5 F (36.9 C)-100.1 F (37.8 C)] 99.3 F (37.4 C) (11/30 0525) Pulse Rate:  [81-87] 84  (11/30 0525) Resp:  [20] 20  (11/30 0525) BP: (115-134)/(59-71) 120/59 mmHg (11/30 0952) SpO2:  [94 %-95 %] 95 % (11/30 0525) Last BM Date: 03/16/11  Intake/Output from previous day: 11/29 0701 - 11/30 0700 In: 240 [P.O.:240] Out: 2200 [Urine:2200] Intake/Output this shift:    General appearance: alert and no distress GI: soft, non-distended, active bowel sounds Extremities: trace edema Incision/Wound:  Healing well with no sign of infection.  No sign of recurrent hernia  Lab Results:   Basename 03/19/11 0842  WBC 6.6  HGB 11.3*  HCT 34.4*  PLT 112*   CBG (last 3)   Basename 03/21/11 0817 03/20/11 2232 03/20/11 1728  GLUCAP 139* 145* 191*     BMET No results found for this basename: NA:2,K:2,CL:2,CO2:2,GLUCOSE:2,BUN:2,CREATININE:2,CALCIUM:2 in the last 72 hours PT/INR No results found for this basename: LABPROT:2,INR:2 in the last 72 hours ABG No results found for this basename: PHART:2,PCO2:2,PO2:2,HCO3:2 in the last 72 hours  Studies/Results: No results found.  Anti-infectives: Anti-infectives     Start     Dose/Rate Route Frequency Ordered Stop   03/17/11 1400   ceFAZolin (ANCEF) IVPB 1 g/50 mL premix        1 g 100 mL/hr over 30 Minutes Intravenous 3 times per day 03/17/11 1206 03/17/11 1528   03/16/11 1145   ceFAZolin (ANCEF) IVPB 2 g/50 mL premix        2 g 100 mL/hr over 30 Minutes Intravenous 60 min pre-op 03/16/11 1138 03/17/11 0745          Assessment/Plan: s/p Procedure(s): LAPAROSCOPIC VENTRAL HERNIA Discharge  LOS: 4 days    Romi Rathel K. 03/21/2011

## 2011-03-21 NOTE — Progress Notes (Signed)
Patient discharged to home in care of family. Medications and instructions reviewed with patient with no questions. IV d/c'd with cath intact. Patient is to followup with Dr. Corliss Skains in 3 weeks.

## 2011-04-08 ENCOUNTER — Ambulatory Visit (INDEPENDENT_AMBULATORY_CARE_PROVIDER_SITE_OTHER): Payer: 59 | Admitting: Surgery

## 2011-04-08 ENCOUNTER — Encounter (INDEPENDENT_AMBULATORY_CARE_PROVIDER_SITE_OTHER): Payer: Self-pay | Admitting: Surgery

## 2011-04-08 VITALS — BP 164/82 | HR 98 | Temp 96.9°F | Resp 18 | Ht 63.0 in | Wt 187.8 lb

## 2011-04-08 DIAGNOSIS — K429 Umbilical hernia without obstruction or gangrene: Secondary | ICD-10-CM

## 2011-04-08 MED ORDER — OXYCODONE-ACETAMINOPHEN 5-325 MG PO TABS
1.0000 | ORAL_TABLET | ORAL | Status: AC | PRN
Start: 2011-04-08 — End: 2011-04-18

## 2011-04-08 NOTE — Patient Instructions (Signed)
Wear your abdominal binder as needed.

## 2011-04-08 NOTE — Progress Notes (Signed)
Filed Vitals:   04/08/11 1604  BP: 164/82  Pulse: 98  Temp: 96.9 F (36.1 C)  Resp: 18     S/p laparoscopic ventral hernia repair with mesh on 03/17/11.  She has had some pain management issues and is complaining mainly of some lower abdominal pain.  This is in the skin fold under her pannus.  All of her incisions seem to be healing well.  She has a soft seroma at her epigastric hernia site, but this is minimal.  No sign of any recurrent hernia.  I encouraged her to wear her abdominal binder for support.  I gave her a refill of Percocet and will recheck her in 3 weeks.  No heavy lifting.  Avoid constipation.  Wilmon Arms. Corliss Skains, MD, [facility]  04/08/2011 4:41 PM

## 2011-05-01 ENCOUNTER — Ambulatory Visit (INDEPENDENT_AMBULATORY_CARE_PROVIDER_SITE_OTHER): Payer: 59 | Admitting: Surgery

## 2011-05-01 ENCOUNTER — Encounter (INDEPENDENT_AMBULATORY_CARE_PROVIDER_SITE_OTHER): Payer: Self-pay | Admitting: Surgery

## 2011-05-01 VITALS — BP 136/74 | HR 80 | Temp 97.2°F | Resp 18 | Ht 63.0 in | Wt 211.6 lb

## 2011-05-01 DIAGNOSIS — K429 Umbilical hernia without obstruction or gangrene: Secondary | ICD-10-CM

## 2011-05-01 NOTE — Progress Notes (Signed)
The patient is doing slightly better.  Her incisions are all well-healed with no sign of infection.  No sign of recurrence.  She continues to have some tenderness around her stay suture sites, which is expected.  She is doing better while wearing her abdominal binder for support.  Recommend Aleve for her muscle soreness.  The pain at the suture sites should resolve over time.  No heavy lifting.  Follow-up 4 weeks.  Wilmon Arms. Corliss Skains, MD, [facility]  05/01/2011 1:51 PM    Previous note: 04/08/11  S/p laparoscopic ventral hernia repair with mesh on 03/17/11.  She has had some pain management issues and is complaining mainly of some lower abdominal pain.  This is in the skin fold under her pannus.  All of her incisions seem to be healing well.  She has a soft seroma at her epigastric hernia site, but this is minimal.  No sign of any recurrent hernia.  I encouraged her to wear her abdominal binder for support.  I gave her a refill of Percocet and will recheck her in 3 weeks.  No heavy lifting.  Avoid constipation.  Wilmon Arms. Corliss Skains, MD, [facility]  04/08/2011 4:41 PM

## 2011-05-14 ENCOUNTER — Other Ambulatory Visit: Payer: Self-pay | Admitting: Family Medicine

## 2011-05-14 ENCOUNTER — Telehealth: Payer: Self-pay | Admitting: Family Medicine

## 2011-05-14 MED ORDER — INSULIN PEN NEEDLE 31G X 5 MM MISC: 1.0000 [IU] | Freq: Three times a day (TID) | Status: DC

## 2011-05-14 MED ORDER — INSULIN GLARGINE 100 UNIT/ML ~~LOC~~ SOLN: 15.0000 [IU] | SUBCUTANEOUS | Status: DC

## 2011-05-14 NOTE — Telephone Encounter (Signed)
States that she picked up her insulin and needles and needs shorter needles - the ones she has are too long because of the stomach surgery she had.

## 2011-05-14 NOTE — Telephone Encounter (Signed)
Medications called in. Please inform pt.  Thank you

## 2011-05-14 NOTE — Telephone Encounter (Signed)
Spoke with patient and she wants new Rx sent to [facility], [place],   for shorter and more fine needles because the ones she has are too long and thick. States she has a mesh in her abdomen and it bothers her. Also wants new RX sent for insulin . Advised will send message to MD.

## 2011-05-14 NOTE — Telephone Encounter (Signed)
Patient notified

## 2011-05-14 NOTE — Telephone Encounter (Signed)
Pt is checking to see if script has been sent in.  She is concerned about it being sent in today,

## 2011-05-15 ENCOUNTER — Other Ambulatory Visit: Payer: Self-pay | Admitting: *Deleted

## 2011-05-15 MED ORDER — INSULIN GLARGINE 100 UNIT/ML ~~LOC~~ SOLN: 15.0000 [IU] | Freq: Every day | SUBCUTANEOUS | Status: DC

## 2011-05-15 NOTE — Telephone Encounter (Signed)
Patient calls because insulin was sent in in vial instead of pen. Lantus Solostar pen  called to pharmacy.

## 2011-05-21 ENCOUNTER — Ambulatory Visit: Payer: 59 | Admitting: Family Medicine

## 2011-05-26 ENCOUNTER — Encounter: Payer: Self-pay | Admitting: Family Medicine

## 2011-05-26 ENCOUNTER — Ambulatory Visit (INDEPENDENT_AMBULATORY_CARE_PROVIDER_SITE_OTHER): Payer: Medicare Other | Admitting: Family Medicine

## 2011-05-26 DIAGNOSIS — J309 Allergic rhinitis, unspecified: Secondary | ICD-10-CM

## 2011-05-26 DIAGNOSIS — Z23 Encounter for immunization: Secondary | ICD-10-CM

## 2011-05-26 MED ORDER — CETIRIZINE HCL 10 MG PO TABS: 10.0000 mg | ORAL_TABLET | Freq: Every day | ORAL | Status: DC

## 2011-05-26 MED ORDER — QUETIAPINE FUMARATE 200 MG PO TABS: 100.0000 mg | ORAL_TABLET | Freq: Every day | ORAL | Status: DC

## 2011-05-26 MED ORDER — FLUTICASONE PROPIONATE 50 MCG/ACT NA SUSP: NASAL | Status: DC

## 2011-05-26 NOTE — Patient Instructions (Signed)
Allergic Rhinitis Allergic rhinitis is when the mucous membranes in the nose respond to allergens. Allergens are particles in the air that cause your body to have an allergic reaction. This causes you to release allergic antibodies. Through a chain of events, these eventually cause you to release histamine into the blood stream (hence the use of antihistamines). Although meant to be protective to the body, it is this release that causes your discomfort, such as frequent sneezing, congestion and an itchy runny nose.  CAUSES  The pollen allergens may come from grasses, trees, and weeds. This is seasonal allergic rhinitis, or "hay fever." Other allergens cause year-round allergic rhinitis (perennial allergic rhinitis) such as house dust mite allergen, pet dander and mold spores.  SYMPTOMS   Nasal stuffiness (congestion).   Runny, itchy nose with sneezing and tearing of the eyes.   There is often an itching of the mouth, eyes and ears.  It cannot be cured, but it can be controlled with medications. DIAGNOSIS  If you are unable to determine the offending allergen, skin or blood testing may find it. TREATMENT   Avoid the allergen.   Medications and allergy shots (immunotherapy) can help.   Hay fever may often be treated with antihistamines in pill or nasal spray forms. Antihistamines block the effects of histamine. There are over-the-counter medicines that may help with nasal congestion and swelling around the eyes. Check with your caregiver before taking or giving this medicine.  If the treatment above does not work, there are many new medications your caregiver can prescribe. Stronger medications may be used if initial measures are ineffective. Desensitizing injections can be used if medications and avoidance fails. Desensitization is when a patient is given ongoing shots until the body becomes less sensitive to the allergen. Make sure you follow up with your caregiver if problems continue. SEEK  MEDICAL CARE IF:   You develop fever (more than 100.5 F (38.1 C).   You develop a cough that does not stop easily (persistent).   You have shortness of breath.   You start wheezing.   Symptoms interfere with normal daily activities.  Document Released: 12/31/2000 Document Revised: 12/18/2010 Document Reviewed: 07/12/2008 Exit[employer] Patient Information 2012 [employer]. 

## 2011-05-26 NOTE — Telephone Encounter (Signed)
Error

## 2011-05-26 NOTE — Telephone Encounter (Signed)
This encounter was created in error - please disregard.

## 2011-05-26 NOTE — Progress Notes (Signed)
  Subjective:    Patient ID: [name] [name], female    DOB: 1950/11/16, 61 y.o.   MRN: [id number]  HPI Patient presents today for acute visit for URI. Symptoms include rhinorrhea nasal congestion postnasal drip and secondary pharyngitis/throat irritation. Patient states symptoms and her present for the past 2-3 weeks. Positive subjective fevers and chills. Positive sick contacts in multiple family members with type symptoms. Patient is currently an intermittent smoker. No cough or increased work of breathing. Patient is a baseline history of allergic rhinitis. Patient states she's been using Zyrtec intermittently with minimal improvement in symptoms. Patient states she's used Flonase in the past however has not used in several months. Review of Systems See HPI, otherwise ROS negative.     Objective:   Physical Exam Gen: up in chair, NAD HEENT: NCAT, EOMI, TMs clear bilaterally, +nasal erythema, rhinorrhea bilaterally, + post oropharyngeal erythema  CV: RRR, no murmurs auscultated PULM: CTAB, no wheezes, rales, rhoncii ABD: S/NT/+ bowel sounds  EXT: 2+ peripheral pulses   Assessment & Plan:

## 2011-05-26 NOTE — Assessment & Plan Note (Addendum)
Likely flare of this from URI and medication non-compliance. WIll place pt back on scheduled flonase and zyrtec. Plan to follow up in 2-4 weeks. Infectious red flags discussed. Next step will be restarting on singulair.

## 2011-05-29 ENCOUNTER — Encounter (INDEPENDENT_AMBULATORY_CARE_PROVIDER_SITE_OTHER): Payer: 59 | Admitting: Surgery

## 2011-06-19 ENCOUNTER — Encounter (INDEPENDENT_AMBULATORY_CARE_PROVIDER_SITE_OTHER): Payer: Self-pay | Admitting: Surgery

## 2011-06-19 ENCOUNTER — Ambulatory Visit (INDEPENDENT_AMBULATORY_CARE_PROVIDER_SITE_OTHER): Payer: Medicare Other | Admitting: Surgery

## 2011-06-19 VITALS — BP 127/74 | HR 72 | Temp 98.6°F | Resp 14 | Ht 63.0 in | Wt 214.6 lb

## 2011-06-19 DIAGNOSIS — K429 Umbilical hernia without obstruction or gangrene: Secondary | ICD-10-CM

## 2011-06-19 MED ORDER — HYDROCODONE-ACETAMINOPHEN 5-325 MG PO TABS: 1.0000 | ORAL_TABLET | ORAL | Status: AC | PRN

## 2011-06-19 NOTE — Progress Notes (Signed)
The patient has been having some issues with an upper respiratory infection over the last couple of weeks.  Last week, she was sitting up when she felt some burning pain in her epigastrium.  This area has been fairly tender over the last few days.  No bulging noted.  She has some right-sided tenderness at her stay sutures.  Her incisions are all well-healed with no sign of infection.  No sign of recurrent hernia.  She has some mild point tenderness at her stay sutures in her epigastrium and down her right side.  She has some tenderness at her stay sutures, but no significant problems at her hernia repair.  She is using a small back brace, which is not providing enough support for her protuberant abdomen.  I recommended getting a wider elastic abdominal binder to provide better support.  I also gave her a prescription for Vicodin.  We will see her back on a PRN basis.  Wilmon Arms. Corliss Skains, MD, [facility]  06/19/2011 9:23 AM

## 2011-06-24 ENCOUNTER — Ambulatory Visit: Payer: Medicare Other | Admitting: Family Medicine

## 2011-07-22 ENCOUNTER — Telehealth: Payer: Self-pay | Admitting: Family Medicine

## 2011-07-22 ENCOUNTER — Other Ambulatory Visit: Payer: Self-pay | Admitting: Family Medicine

## 2011-07-22 DIAGNOSIS — G894 Chronic pain syndrome: Secondary | ICD-10-CM

## 2011-07-22 DIAGNOSIS — I10 Essential (primary) hypertension: Secondary | ICD-10-CM

## 2011-07-22 MED ORDER — NORTRIPTYLINE HCL 25 MG PO CAPS: 25.0000 mg | ORAL_CAPSULE | Freq: Every day | ORAL | Status: DC

## 2011-07-22 MED ORDER — AMLODIPINE BESYLATE 10 MG PO TABS: 10.0000 mg | ORAL_TABLET | Freq: Every day | ORAL | Status: DC

## 2011-07-22 MED ORDER — ESOMEPRAZOLE MAGNESIUM 40 MG PO CPDR: 40.0000 mg | DELAYED_RELEASE_CAPSULE | Freq: Every day | ORAL | Status: DC

## 2011-07-22 MED ORDER — METOPROLOL TARTRATE 50 MG PO TABS: 150.0000 mg | ORAL_TABLET | Freq: Two times a day (BID) | ORAL | Status: DC

## 2011-07-22 MED ORDER — LISINOPRIL 20 MG PO TABS: 20.0000 mg | ORAL_TABLET | Freq: Every day | ORAL | Status: DC

## 2011-07-22 MED ORDER — QUETIAPINE FUMARATE 200 MG PO TABS: 100.0000 mg | ORAL_TABLET | Freq: Every day | ORAL | Status: DC

## 2011-07-22 NOTE — Telephone Encounter (Signed)
Refills completed. Please inform pt.

## 2011-07-22 NOTE — Telephone Encounter (Signed)
Fwd. To Dr.Newton for refills. Lorenda Hatchet, [name]

## 2011-07-22 NOTE — Telephone Encounter (Signed)
Patient is calling for refill on Lisinopril, Amlodipine, Metoprolol, Nexium, Seroquel and Nortiptyline sent to [facility] on [address].  She is not using [facility] any longer.

## 2011-07-23 ENCOUNTER — Telehealth: Payer: Self-pay | Admitting: Family Medicine

## 2011-07-23 NOTE — Telephone Encounter (Signed)
Returned call to patient and informed to check with pharmacy.  Meds were refilled yesterday per Dr. Emilee Hero "print" instead of "normal" to be transmitted to fax.  Meds called to pharmacy per Terese Door, CMA.  Gaylene Brooks, RN

## 2011-07-23 NOTE — Telephone Encounter (Signed)
States pharmacy did not rec'd script for her amlodipine and metoprolol [facility] [employer]

## 2011-08-11 ENCOUNTER — Other Ambulatory Visit (HOSPITAL_COMMUNITY): Payer: Self-pay | Admitting: Internal Medicine

## 2011-08-11 DIAGNOSIS — Z1231 Encounter for screening mammogram for malignant neoplasm of breast: Secondary | ICD-10-CM

## 2011-08-12 ENCOUNTER — Encounter: Payer: Self-pay | Admitting: Family Medicine

## 2011-08-12 ENCOUNTER — Ambulatory Visit (HOSPITAL_COMMUNITY)
Admission: RE | Admit: 2011-08-12 | Discharge: 2011-08-12 | Disposition: A | Discharge status: Home / Self Care | Payer: Medicare Other | Source: Ambulatory Visit | Attending: Internal Medicine | Admitting: Internal Medicine

## 2011-08-12 ENCOUNTER — Ambulatory Visit (INDEPENDENT_AMBULATORY_CARE_PROVIDER_SITE_OTHER): Payer: Medicare Other | Admitting: Family Medicine

## 2011-08-12 VITALS — BP 148/82 | HR 88 | Ht 63.0 in | Wt 219.0 lb

## 2011-08-12 DIAGNOSIS — Z1231 Encounter for screening mammogram for malignant neoplasm of breast: Secondary | ICD-10-CM

## 2011-08-12 DIAGNOSIS — G894 Chronic pain syndrome: Secondary | ICD-10-CM

## 2011-08-12 DIAGNOSIS — R040 Epistaxis: Secondary | ICD-10-CM

## 2011-08-12 DIAGNOSIS — E119 Type 2 diabetes mellitus without complications: Secondary | ICD-10-CM

## 2011-08-12 DIAGNOSIS — I10 Essential (primary) hypertension: Secondary | ICD-10-CM

## 2011-08-12 DIAGNOSIS — J309 Allergic rhinitis, unspecified: Secondary | ICD-10-CM

## 2011-08-12 LAB — COMPREHENSIVE METABOLIC PANEL
ALT: 19 U/L (ref 0–35)
AST: 21 U/L (ref 0–37)
Albumin: 4.2 g/dL (ref 3.5–5.2)
Alkaline Phosphatase: 94 U/L (ref 39–117)
BUN: 9 mg/dL (ref 6–23)
CO2: 27 mEq/L (ref 19–32)
Calcium: 8.7 mg/dL (ref 8.4–10.5)
Chloride: 99 mEq/L (ref 96–112)
Creat: 0.71 mg/dL (ref 0.50–1.10)
Glucose, Bld: 251 mg/dL — ABNORMAL HIGH (ref 70–99)
Potassium: 4.1 mEq/L (ref 3.5–5.3)
Sodium: 139 mEq/L (ref 135–145)
Total Bilirubin: 0.3 mg/dL (ref 0.3–1.2)
Total Protein: 7.3 g/dL (ref 6.0–8.3)

## 2011-08-12 LAB — POCT GLYCOSYLATED HEMOGLOBIN (HGB A1C): Hemoglobin A1C: 9.4

## 2011-08-12 LAB — LDL CHOLESTEROL, DIRECT: Direct LDL: 108 mg/dL — ABNORMAL HIGH

## 2011-08-12 NOTE — Progress Notes (Signed)
S:  Pt presents today with multiple nonspecific complaints, but we will try to focus on a few today.  Nosebleeds: has been occuring x1-3 weeks. Has also had severe nasal and sinus allergic sxs including itchy eyes, runny nose, nasal congestion. Pt states that she has also had some sinus headache. Pt states that she has been blowing her nose on a regular basis and nose has become progressively more irritated. Bleeding has been more associated with nose blowing. Pt is on zyrtec and flonase for her allergies.   DM: Checking CBGs?:yes How Often?:once to twice daily  Blood Sugar Range:150s-190s Polyuria, polydypsia, polyphagia:no Symptomatic Hypoglycemia:no Medication Compliance?:yes per pt ACE if hypertensive/obesity?:yes Lab Results  Component Value Date   LDLCALC 82 10/28/2010   Lab Results  Component Value Date   HGBA1C 9.4 08/12/2011   O:  Current Outpatient Prescriptions  Medication Sig Dispense Refill  . albuterol (PROVENTIL HFA;VENTOLIN HFA) 108 (90 BASE) MCG/ACT inhaler Inhale 2 puffs into the lungs every 6 (six) hours as needed. For wheezing.       Marland Kitchen amLODipine (NORVASC) 10 MG tablet Take 1 tablet (10 mg total) by mouth daily.  30 tablet  11  . aspirin EC 81 MG tablet Take 81 mg by mouth daily.        Marland Kitchen BEPREVE 1.5 % SOLN       . cetirizine (ZYRTEC) 10 MG tablet Take 10 mg by mouth daily as needed. allergies      . cetirizine (ZYRTEC) 10 MG tablet Take 1 tablet (10 mg total) by mouth daily.  30 tablet  11  . clotrimazole (LOTRIMIN) 1 % cream Apply 1 application topically 2 (two) times daily. Apply to feet twice daily for 4 weeks       . docusate sodium (COLACE) 100 MG capsule Take 100 mg by mouth 2 (two) times daily.       Marland Kitchen esomeprazole (NEXIUM) 40 MG capsule Take 1 capsule (40 mg total) by mouth daily before breakfast.  30 capsule  6  . esomeprazole (NEXIUM) 40 MG capsule Take 1 capsule (40 mg total) by mouth daily before breakfast.  30 capsule  6  . FLUoxetine (PROZAC) 40 MG  capsule Take 40 mg by mouth daily.       . fluticasone (FLONASE) 50 MCG/ACT nasal spray Place 2 sprays into the nose daily. Both nostrils      . fluticasone (FLONASE) 50 MCG/ACT nasal spray 2 sprays into each nostril daily  16 g  6  . gabapentin (NEURONTIN) 100 MG capsule Take 100 mg by mouth 3 (three) times daily.        Marland Kitchen glipiZIDE (GLUCOTROL) 5 MG 24 hr tablet TAKE 1 TABLET BY MOUTH ONCE DAILY  30 tablet  PRN  . glucose blood (ONE TOUCH TEST STRIPS) test strip Check blood sugar twice daily. Dispense QS       . hydrochlorothiazide 25 MG tablet Take 25 mg by mouth every morning.       . hydrocortisone (ANUSOL-HC) 2.5 % rectal cream Place 1 application rectally as directed. Per box instructions      . hydrocortisone 1 % cream Apply to affected area 2 times daily  30 g  1  . Hypromellose (ARTIFICIAL TEARS OP) Place 1 drop into both eyes daily as needed. 1 drop in each eye as needed for dry eye      . insulin glargine (LANTUS SOLOSTAR) 100 UNIT/ML injection Inject 15 Units into the skin daily.  10 mL  12  .  Insulin Pen Needle (BD ULTRA-FINE PEN NEEDLES) 29G X 12.7MM MISC Dispense one box QS for once daily insulin injection.  1 each  11  . Insulin Pen Needle 31G X 5 MM MISC 1 Units by Does not apply route 3 (three) times daily.  100 each  6  . lisinopril (PRINIVIL,ZESTRIL) 20 MG tablet Take 1 tablet (20 mg total) by mouth daily.  60 tablet  6  . metoCLOPramide (REGLAN) 5 MG tablet Take 5 mg by mouth daily.       . metoprolol (LOPRESSOR) 50 MG tablet Take 3 tablets (150 mg total) by mouth 2 (two) times daily.  200 tablet  6  . montelukast (SINGULAIR) 10 MG tablet Take 1 tablet (10 mg total) by mouth at bedtime.  30 tablet  2  . nicotine polacrilex (NICORETTE) 2 MG gum Take 2 mg by mouth daily as needed. Use as needed for craving.   Use up to 10 pieces per day.      . nortriptyline (PAMELOR) 25 MG capsule Take 1 capsule (25 mg total) by mouth at bedtime.  30 capsule  2  . nystatin (MYCOSTATIN) cream  Apply 1 application topically 2 (two) times daily as needed. rash      . ondansetron (ZOFRAN) 4 MG tablet Take 4 mg by mouth every 8 (eight) hours as needed. nausea       . ONETOUCH DELICA LANCETS MISC Test blood sugar 2 times daily. Dispense QS       . pentosan polysulfate (ELMIRON) 100 MG capsule Take 100 mg by mouth 3 (three) times daily before meals.        . phenazopyridine (PYRIDIUM) 200 MG tablet Take 200 mg by mouth every 6 (six) hours as needed. For bladder pain.       . polyethylene glycol (GLYCOLAX) packet Take 17 g by mouth 2 (two) times daily. Mix in 4-6 oz of water      . prednisoLONE acetate (PRED FORTE) 1 % ophthalmic suspension Place 1 drop into both eyes 2 (two) times daily.       . pregabalin (LYRICA) 50 MG capsule Take 1 capsule (50 mg total) by mouth 3 (three) times daily.  90 capsule  3  . QUEtiapine (SEROQUEL) 200 MG tablet Take 0.5 tablets (100 mg total) by mouth at bedtime. Dose is 1/2 tablet (100mg ) of 200mg  tablet  30 tablet  3  . DISCONTD: insulin glargine (LANTUS SOLOSTAR) 100 UNIT/ML injection Inject 5 Units into the skin every morning. Increase morning dose by 1 unit each morning if your blood sugar is >100 on your meter.  15 mL  12  . DISCONTD: metoprolol tartrate (LOPRESSOR) 25 MG tablet Take 2 tablets (50 mg total) by mouth 2 (two) times daily.  60 tablet  11  . DISCONTD: pregabalin (LYRICA) 25 MG capsule Take 1 capsule (25 mg total) by mouth 3 (three) times daily.  90 capsule  2    Wt Readings from Last 3 Encounters:  08/12/11 219 lb (99.338 kg)  06/19/11 214 lb 9.6 oz (97.342 kg)  05/26/11 217 lb (98.431 kg)   Temp Readings from Last 3 Encounters:  06/19/11 98.6 F (37 C) Temporal  05/01/11 97.2 F (36.2 C)   04/08/11 96.9 F (36.1 C) Temporal   BP Readings from Last 3 Encounters:  08/12/11 148/82  06/19/11 127/74  05/26/11 125/79   Pulse Readings from Last 3 Encounters:  08/12/11 88  06/19/11 72  05/26/11 80   General: alert and  cooperative HEENT: PERRLA, extra ocular movement intact and noted small, mild blood clots in nares bilaterally  Heart: S1, S2 normal, no murmur, rub or gallop, regular rate and rhythm Lungs: clear to auscultation, no wheezes or rales and unlabored breathing Abdomen: abdomen is soft without significant tenderness, masses, organomegaly or guarding Extremities: extremities normal, atraumatic, no cyanosis or edema Skin:no rashes Neurology: normal without focal findings   A/P:

## 2011-08-12 NOTE — Patient Instructions (Signed)
It was good to see you today  Be sure to take your blood pressure medications as prescribed I am checking some blood work today. Medications: STOP: nortriptyline and neurontin  INCREASE: Lantus to 20 units daily. Check your blood sugars everyday  Come back next week for a follow up visit.   Nosebleed Nosebleeds can be caused by many conditions including trauma, infections, polyps, foreign bodies, dry mucous membranes or climate, medications and air conditioning. Most nosebleeds occur in the front of the nose. It is because of this location that most nosebleeds can be controlled by pinching the nostrils gently and continuously. Do this for at least 10 to 20 minutes. The reason for this long continuous pressure is that you must hold it long enough for the blood to clot. If during that 10 to 20 minute time period, pressure is released, the process may have to be started again. The nosebleed may stop by itself, quit with pressure, need concentrated heating (cautery) or stop with pressure from packing. HOME CARE INSTRUCTIONS   If your nose was packed, try to maintain the pack inside until your caregiver removes it. If a gauze pack was used and it starts to fall out, gently replace or cut the end off. Do not cut if a balloon catheter was used to pack the nose. Otherwise, do not remove unless instructed.   Avoid blowing your nose for 12 hours after treatment. This could dislodge the pack or clot and start bleeding again.   If the bleeding starts again, sit up and bending forward, gently pinch the front half of your nose continuously for 20 minutes.   If bleeding was caused by dry mucous membranes, cover the inside of your nose every morning with a petroleum or antibiotic ointment. Use your little fingertip as an applicator. Do this as needed during dry weather. This will keep the mucous membranes moist and allow them to heal.   Maintain humidity in your home by using less air conditioning or using a  humidifier.   Do not use aspirin or medications which make bleeding more likely. Your caregiver can give you recommendations on this.   Resume normal activities as able but try to avoid straining, lifting or bending at the waist for several days.   If the nosebleeds become recurrent and the cause is unknown, your caregiver may suggest laboratory tests.  SEEK IMMEDIATE MEDICAL CARE IF:   Bleeding recurs and cannot be controlled.   There is unusual bleeding from or bruising on other parts of the body.   You have a fever.   Nosebleeds continue.   There is any worsening of the condition which originally brought you in.   You become lightheaded, feel faint, become sweaty or vomit blood.  MAKE SURE YOU:   Understand these instructions.   Will watch your condition.   Will get help right away if you are not doing well or get worse.  Document Released: 01/15/2005 Document Revised: 03/27/2011 Document Reviewed: 03/09/2009 [facility] Patient Information 2012 [place], [place].

## 2011-08-15 DIAGNOSIS — R040 Epistaxis: Secondary | ICD-10-CM | POA: Insufficient documentation

## 2011-08-15 NOTE — Assessment & Plan Note (Signed)
Elevated A1C today. Will increase lantus to 20 units daily.

## 2011-08-15 NOTE — Assessment & Plan Note (Addendum)
Pt with significant polypharmacy. Will begin with stopping nortriptyline and neurontin. Will otherwise continue with effexor, prozac, and lyrica. Some of these meds overlap with rxs by behavioral health. Next step would be formal pharmacy clinic referral for polypharmacy. This was discussed with pt.

## 2011-08-15 NOTE — Assessment & Plan Note (Signed)
Very mild in nature. Likely secondary to allergic rhinitis. Nasal saline, intranasal vaseline. Discussed decreased nose blowing. If this persists or worsens, may consider ENT referral.

## 2011-08-15 NOTE — Assessment & Plan Note (Signed)
Likely cause of epistaxis with frequent nose blowing. Discussed nasal saline and intranasal vaseline. Will add on singulair to regimen.

## 2011-08-20 ENCOUNTER — Ambulatory Visit: Payer: Medicare Other | Admitting: Family Medicine

## 2011-08-28 ENCOUNTER — Encounter: Payer: Self-pay | Admitting: Family Medicine

## 2011-08-28 ENCOUNTER — Ambulatory Visit (INDEPENDENT_AMBULATORY_CARE_PROVIDER_SITE_OTHER): Payer: Medicare Other | Admitting: Family Medicine

## 2011-08-28 DIAGNOSIS — G629 Polyneuropathy, unspecified: Secondary | ICD-10-CM

## 2011-08-28 DIAGNOSIS — E119 Type 2 diabetes mellitus without complications: Secondary | ICD-10-CM

## 2011-08-28 DIAGNOSIS — E785 Hyperlipidemia, unspecified: Secondary | ICD-10-CM

## 2011-08-28 DIAGNOSIS — G589 Mononeuropathy, unspecified: Secondary | ICD-10-CM

## 2011-08-28 MED ORDER — GABAPENTIN 300 MG PO CAPS: 600.0000 mg | ORAL_CAPSULE | Freq: Three times a day (TID) | ORAL | Status: DC

## 2011-08-28 MED ORDER — INSULIN GLARGINE 100 UNIT/ML ~~LOC~~ SOLN: 25.0000 [IU] | Freq: Every day | SUBCUTANEOUS | Status: DC

## 2011-08-28 MED ORDER — PRAVASTATIN SODIUM 40 MG PO TABS: 40.0000 mg | ORAL_TABLET | Freq: Every day | ORAL | Status: DC

## 2011-08-28 NOTE — Patient Instructions (Signed)
It was good to see you again For your fibromyalgia STOP Lyrica and Prozac I have increased your Neurontin to 600 mg 3 times a day. This is double the previous dose Increase your Lantus to 25 units a day Come back to see me in 3-4 weeks I have also started you medication for cholesterol Call if any questions

## 2011-08-28 NOTE — Progress Notes (Signed)
Subjective:   Stopped lyrica, prozac Taking nortryptiline, seroquel, gabapentin  DM: Checking CBGs?:intermittently  How Often?:once to twice daily.  Blood Sugar Range:160s-180s Polyuria, polydypsia, polyphagia:no Symptomatic Hypoglycemia:no Medication Compliance?:yes ACE if hypertensive/obesity?:yes Statin if LDL >100?:yes  Still having some mild foot pain.     Review of Systems - Negative except as noted above in HPI.   Objective:  Current Outpatient Prescriptions  Medication Sig Dispense Refill  . albuterol (PROVENTIL HFA;VENTOLIN HFA) 108 (90 BASE) MCG/ACT inhaler Inhale 2 puffs into the lungs every 6 (six) hours as needed. For wheezing.       Marland Kitchen amLODipine (NORVASC) 10 MG tablet Take 1 tablet (10 mg total) by mouth daily.  30 tablet  11  . aspirin EC 81 MG tablet Take 81 mg by mouth daily.        Marland Kitchen BEPREVE 1.5 % SOLN       . cetirizine (ZYRTEC) 10 MG tablet Take 10 mg by mouth daily as needed. allergies      . clotrimazole (LOTRIMIN) 1 % cream Apply 1 application topically 2 (two) times daily. Apply to feet twice daily for 4 weeks       . docusate sodium (COLACE) 100 MG capsule Take 100 mg by mouth 2 (two) times daily.       Marland Kitchen esomeprazole (NEXIUM) 40 MG capsule Take 1 capsule (40 mg total) by mouth daily before breakfast.  30 capsule  6  . FLUoxetine (PROZAC) 40 MG capsule Take 40 mg by mouth daily.       . fluticasone (FLONASE) 50 MCG/ACT nasal spray Place 2 sprays into the nose daily. Both nostrils      . fluticasone (FLONASE) 50 MCG/ACT nasal spray 2 sprays into each nostril daily  16 g  6  . gabapentin (NEURONTIN) 100 MG capsule Take 100 mg by mouth 3 (three) times daily.        Marland Kitchen glipiZIDE (GLUCOTROL) 5 MG 24 hr tablet TAKE 1 TABLET BY MOUTH ONCE DAILY  30 tablet  PRN  . glucose blood (ONE TOUCH TEST STRIPS) test strip Check blood sugar twice daily. Dispense QS       . hydrochlorothiazide 25 MG tablet Take 25 mg by mouth every morning.       . hydrocortisone  (ANUSOL-HC) 2.5 % rectal cream Place 1 application rectally as directed. Per box instructions      . hydrocortisone 1 % cream Apply to affected area 2 times daily  30 g  1  . Hypromellose (ARTIFICIAL TEARS OP) Place 1 drop into both eyes daily as needed. 1 drop in each eye as needed for dry eye      . insulin glargine (LANTUS SOLOSTAR) 100 UNIT/ML injection Inject 15 Units into the skin daily.  10 mL  12  . Insulin Pen Needle (BD ULTRA-FINE PEN NEEDLES) 29G X 12.7MM MISC Dispense one box QS for once daily insulin injection.  1 each  11  . Insulin Pen Needle 31G X 5 MM MISC 1 Units by Does not apply route 3 (three) times daily.  100 each  6  . lisinopril (PRINIVIL,ZESTRIL) 20 MG tablet Take 1 tablet (20 mg total) by mouth daily.  60 tablet  6  . metoCLOPramide (REGLAN) 5 MG tablet Take 5 mg by mouth daily.       . metoprolol (LOPRESSOR) 50 MG tablet Take 3 tablets (150 mg total) by mouth 2 (two) times daily.  200 tablet  6  . montelukast (SINGULAIR) 10 MG tablet  Take 1 tablet (10 mg total) by mouth at bedtime.  30 tablet  2  . nicotine polacrilex (NICORETTE) 2 MG gum Take 2 mg by mouth daily as needed. Use as needed for craving.   Use up to 10 pieces per day.      . nortriptyline (PAMELOR) 25 MG capsule Take 1 capsule (25 mg total) by mouth at bedtime.  30 capsule  2  . nystatin (MYCOSTATIN) cream Apply 1 application topically 2 (two) times daily as needed. rash      . ondansetron (ZOFRAN) 4 MG tablet Take 4 mg by mouth every 8 (eight) hours as needed. nausea       . ONETOUCH DELICA LANCETS MISC Test blood sugar 2 times daily. Dispense QS       . pentosan polysulfate (ELMIRON) 100 MG capsule Take 100 mg by mouth 3 (three) times daily before meals.        . phenazopyridine (PYRIDIUM) 200 MG tablet Take 200 mg by mouth every 6 (six) hours as needed. For bladder pain.       . polyethylene glycol (GLYCOLAX) packet Take 17 g by mouth 2 (two) times daily. Mix in 4-6 oz of water      . prednisoLONE acetate  (PRED FORTE) 1 % ophthalmic suspension Place 1 drop into both eyes 2 (two) times daily.       . pregabalin (LYRICA) 50 MG capsule Take 1 capsule (50 mg total) by mouth 3 (three) times daily.  90 capsule  3  . QUEtiapine (SEROQUEL) 200 MG tablet Take 0.5 tablets (100 mg total) by mouth at bedtime. Dose is 1/2 tablet (100mg ) of 200mg  tablet  30 tablet  3  . DISCONTD: insulin glargine (LANTUS SOLOSTAR) 100 UNIT/ML injection Inject 5 Units into the skin every morning. Increase morning dose by 1 unit each morning if your blood sugar is >100 on your meter.  15 mL  12  . DISCONTD: metoprolol tartrate (LOPRESSOR) 25 MG tablet Take 2 tablets (50 mg total) by mouth 2 (two) times daily.  60 tablet  11  . DISCONTD: pregabalin (LYRICA) 25 MG capsule Take 1 capsule (25 mg total) by mouth 3 (three) times daily.  90 capsule  2    Wt Readings from Last 3 Encounters:  08/28/11 216 lb (97.977 kg)  08/12/11 219 lb (99.338 kg)  06/19/11 214 lb 9.6 oz (97.342 kg)   Temp Readings from Last 3 Encounters:  06/19/11 98.6 F (37 C) Temporal  05/01/11 97.2 F (36.2 C)   04/08/11 96.9 F (36.1 C) Temporal   BP Readings from Last 3 Encounters:  08/28/11 136/83  08/12/11 148/82  06/19/11 127/74   Pulse Readings from Last 3 Encounters:  08/28/11 84  08/12/11 88  06/19/11 72    General: alert and cooperative, in wheelchair  HEENT: PERRLA and extra ocular movement intact Heart: S1, S2 normal, no murmur, rub or gallop, regular rate and rhythm Lungs: clear to auscultation, no wheezes or rales and unlabored breathing Abdomen: abdomen is soft without significant tenderness, masses, organomegaly or guarding Extremities: extremities normal, atraumatic, no cyanosis or edema Skin:no rashes Neurology: normal without focal findings   Assessment/Plan:

## 2011-08-29 LAB — MICROALBUMIN / CREATININE URINE RATIO
Creatinine, Urine: 109.9 mg/dL
Microalb Creat Ratio: 6.2 mg/g (ref 0.0–30.0)
Microalb, Ur: 0.68 mg/dL (ref 0.00–1.89)

## 2011-09-24 NOTE — Assessment & Plan Note (Signed)
Increasing lantus to 25 units/day to help with blood sugars. Follow up in 3-4 weeks.

## 2011-09-24 NOTE — Assessment & Plan Note (Signed)
Stopping lyrica and prozac. Increasing neurontin to help with sxs.

## 2011-09-26 ENCOUNTER — Ambulatory Visit: Payer: Medicare Other | Admitting: Family Medicine

## 2011-10-01 ENCOUNTER — Ambulatory Visit (INDEPENDENT_AMBULATORY_CARE_PROVIDER_SITE_OTHER): Payer: Medicare Other | Admitting: Family Medicine

## 2011-10-01 ENCOUNTER — Encounter: Payer: Self-pay | Admitting: Family Medicine

## 2011-10-01 ENCOUNTER — Other Ambulatory Visit: Payer: Self-pay | Admitting: Family Medicine

## 2011-10-01 VITALS — BP 140/85 | HR 97 | Ht 63.0 in | Wt 219.0 lb

## 2011-10-01 DIAGNOSIS — E119 Type 2 diabetes mellitus without complications: Secondary | ICD-10-CM

## 2011-10-01 DIAGNOSIS — I10 Essential (primary) hypertension: Secondary | ICD-10-CM

## 2011-10-01 MED ORDER — NYSTATIN 100000 UNIT/GM EX CREA: 1.0000 "application " | TOPICAL_CREAM | Freq: Two times a day (BID) | CUTANEOUS | Status: DC | PRN

## 2011-10-01 MED ORDER — INSULIN GLARGINE 100 UNIT/ML ~~LOC~~ SOLN: 30.0000 [IU] | Freq: Every day | SUBCUTANEOUS | Status: DC

## 2011-10-01 MED ORDER — LISINOPRIL 40 MG PO TABS: 40.0000 mg | ORAL_TABLET | Freq: Every day | ORAL | Status: DC

## 2011-10-01 NOTE — Progress Notes (Signed)
Subjective:   Diabetes:  Checking CBGs?:yes  How Often?:once daily-bid  Blood Sugar Range:240s-330s Polyuria, polydypsia, polyphagia:yes  Symptomatic Hypoglycemia:no Medication Compliance?:yes; lantus 25 units daily; is NOT on glipizide  ACE if hypertensive/obesity?:yes Statin if LDL >100?:yes      Review of Systems - Negative except as noted above in HPI.  Objective:  Current Outpatient Prescriptions  Medication Sig Dispense Refill  . albuterol (PROVENTIL HFA;VENTOLIN HFA) 108 (90 BASE) MCG/ACT inhaler Inhale 2 puffs into the lungs every 6 (six) hours as needed. For wheezing.       Marland Kitchen amLODipine (NORVASC) 10 MG tablet Take 1 tablet (10 mg total) by mouth daily.  30 tablet  11  . aspirin EC 81 MG tablet Take 81 mg by mouth daily.        Marland Kitchen BEPREVE 1.5 % SOLN       . cetirizine (ZYRTEC) 10 MG tablet Take 10 mg by mouth daily as needed. allergies      . clotrimazole (LOTRIMIN) 1 % cream Apply 1 application topically 2 (two) times daily. Apply to feet twice daily for 4 weeks       . docusate sodium (COLACE) 100 MG capsule Take 100 mg by mouth 2 (two) times daily.       Marland Kitchen esomeprazole (NEXIUM) 40 MG capsule Take 1 capsule (40 mg total) by mouth daily before breakfast.  30 capsule  6  . FLUoxetine (PROZAC) 40 MG capsule Take 40 mg by mouth daily.       . fluticasone (FLONASE) 50 MCG/ACT nasal spray Place 2 sprays into the nose daily. Both nostrils      . fluticasone (FLONASE) 50 MCG/ACT nasal spray 2 sprays into each nostril daily  16 g  6  . gabapentin (NEURONTIN) 300 MG capsule Take 2 capsules (600 mg total) by mouth 3 (three) times daily.  200 capsule  6  . glipiZIDE (GLUCOTROL) 5 MG 24 hr tablet TAKE 1 TABLET BY MOUTH ONCE DAILY  30 tablet  PRN  . glucose blood (ONE TOUCH TEST STRIPS) test strip Check blood sugar twice daily. Dispense QS       . hydrochlorothiazide 25 MG tablet Take 25 mg by mouth every morning.       . hydrocortisone (ANUSOL-HC) 2.5 % rectal cream Place 1 application  rectally as directed. Per box instructions      . Hypromellose (ARTIFICIAL TEARS OP) Place 1 drop into both eyes daily as needed. 1 drop in each eye as needed for dry eye      . insulin glargine (LANTUS SOLOSTAR) 100 UNIT/ML injection Inject 25 Units into the skin daily.  10 mL  12  . Insulin Pen Needle (BD ULTRA-FINE PEN NEEDLES) 29G X 12.7MM MISC Dispense one box QS for once daily insulin injection.  1 each  11  . Insulin Pen Needle 31G X 5 MM MISC 1 Units by Does not apply route 3 (three) times daily.  100 each  6  . lisinopril (PRINIVIL,ZESTRIL) 20 MG tablet Take 1 tablet (20 mg total) by mouth daily.  60 tablet  6  . metoCLOPramide (REGLAN) 5 MG tablet Take 5 mg by mouth daily.       . metoprolol (LOPRESSOR) 50 MG tablet Take 3 tablets (150 mg total) by mouth 2 (two) times daily.  200 tablet  6  . montelukast (SINGULAIR) 10 MG tablet Take 1 tablet (10 mg total) by mouth at bedtime.  30 tablet  2  . nicotine polacrilex (NICORETTE) 2 MG  gum Take 2 mg by mouth daily as needed. Use as needed for craving.   Use up to 10 pieces per day.      . nortriptyline (PAMELOR) 25 MG capsule Take 1 capsule (25 mg total) by mouth at bedtime.  30 capsule  2  . nystatin (MYCOSTATIN) cream Apply 1 application topically 2 (two) times daily as needed. rash      . ondansetron (ZOFRAN) 4 MG tablet Take 4 mg by mouth every 8 (eight) hours as needed. nausea       . ONETOUCH DELICA LANCETS MISC Test blood sugar 2 times daily. Dispense QS       . pentosan polysulfate (ELMIRON) 100 MG capsule Take 100 mg by mouth 3 (three) times daily before meals.        . phenazopyridine (PYRIDIUM) 200 MG tablet Take 200 mg by mouth every 6 (six) hours as needed. For bladder pain.       . polyethylene glycol (GLYCOLAX) packet Take 17 g by mouth 2 (two) times daily. Mix in 4-6 oz of water      . pravastatin (PRAVACHOL) 40 MG tablet Take 1 tablet (40 mg total) by mouth daily.  90 tablet  3  . prednisoLONE acetate (PRED FORTE) 1 %  ophthalmic suspension Place 1 drop into both eyes 2 (two) times daily.       . QUEtiapine (SEROQUEL) 200 MG tablet Take 0.5 tablets (100 mg total) by mouth at bedtime. Dose is 1/2 tablet (100mg ) of 200mg  tablet  30 tablet  3  . DISCONTD: insulin glargine (LANTUS SOLOSTAR) 100 UNIT/ML injection Inject 5 Units into the skin every morning. Increase morning dose by 1 unit each morning if your blood sugar is >100 on your meter.  15 mL  12    Wt Readings from Last 3 Encounters:  10/01/11 219 lb (99.338 kg)  08/28/11 216 lb (97.977 kg)  08/12/11 219 lb (99.338 kg)   Temp Readings from Last 3 Encounters:  06/19/11 98.6 F (37 C) Temporal  05/01/11 97.2 F (36.2 C)   04/08/11 96.9 F (36.1 C) Temporal   BP Readings from Last 3 Encounters:  10/01/11 140/85  08/28/11 136/83  08/12/11 148/82   Pulse Readings from Last 3 Encounters:  10/01/11 97  08/28/11 84  08/12/11 88     General: alert and cooperative, in wheelchair  HEENT: PERRLA and extra ocular movement intact  Heart: S1, S2 normal, no murmur, rub or gallop, regular rate and rhythm  Lungs: clear to auscultation, no wheezes or rales and unlabored breathing  Abdomen: abdomen is soft without significant tenderness, masses, organomegaly or guarding  Extremities: extremities normal, atraumatic, no cyanosis or edema  Skin:no rashes  Neurology: normal without focal findings  Assessment/Plan:

## 2011-10-01 NOTE — Patient Instructions (Signed)
It  was good to see you again Increase your Lantus to 30 units a day Increase the lisinopril to 40 mg a day Stop the HCTZ Call me in 1-2 weeks to let me know to blood sugars are doing Make an appointment for pharmacy clinic Call if any questions

## 2011-10-06 NOTE — Assessment & Plan Note (Signed)
Mildly elevated. Goal BP <130/80. Increase lisinopril to 40mg  daily.

## 2011-10-06 NOTE — Assessment & Plan Note (Signed)
Increase lantus to 30 units daily.

## 2011-10-17 ENCOUNTER — Other Ambulatory Visit: Payer: Self-pay

## 2011-10-17 ENCOUNTER — Ambulatory Visit (INDEPENDENT_AMBULATORY_CARE_PROVIDER_SITE_OTHER): Payer: Medicare Other | Admitting: Pharmacist

## 2011-10-17 ENCOUNTER — Ambulatory Visit (HOSPITAL_COMMUNITY)
Admission: RE | Admit: 2011-10-17 | Discharge: 2011-10-17 | Disposition: A | Discharge status: Home / Self Care | Payer: Medicare Other | Source: Ambulatory Visit | Attending: Family Medicine | Admitting: Family Medicine

## 2011-10-17 ENCOUNTER — Ambulatory Visit (INDEPENDENT_AMBULATORY_CARE_PROVIDER_SITE_OTHER): Payer: Medicare Other | Admitting: Family Medicine

## 2011-10-17 ENCOUNTER — Encounter: Payer: Self-pay | Admitting: Pharmacist

## 2011-10-17 ENCOUNTER — Encounter: Payer: Self-pay | Admitting: Family Medicine

## 2011-10-17 VITALS — BP 139/79 | Ht 64.0 in | Wt 220.2 lb

## 2011-10-17 VITALS — BP 139/79 | HR 84 | Ht 63.0 in | Wt 220.0 lb

## 2011-10-17 DIAGNOSIS — K219 Gastro-esophageal reflux disease without esophagitis: Secondary | ICD-10-CM

## 2011-10-17 DIAGNOSIS — E119 Type 2 diabetes mellitus without complications: Secondary | ICD-10-CM

## 2011-10-17 DIAGNOSIS — R079 Chest pain, unspecified: Secondary | ICD-10-CM

## 2011-10-17 DIAGNOSIS — R9431 Abnormal electrocardiogram [ECG] [EKG]: Secondary | ICD-10-CM | POA: Insufficient documentation

## 2011-10-17 MED ORDER — NITROGLYCERIN 0.4 MG SL SUBL: 0.4000 mg | SUBLINGUAL_TABLET | SUBLINGUAL | Status: DC | PRN

## 2011-10-17 NOTE — Patient Instructions (Signed)
It was good to see today Your chest pain may be coming from multiple causes However, given your risk factors I am sending that to cardiology for repeat evaluation I have called in some nitroglycerin glycerin for you to take for your symptoms. If her chest pain occurs again go to the emergency room Call if any questions.

## 2011-10-17 NOTE — Assessment & Plan Note (Signed)
Diabetes of many  yrs duration currently under fair control of blood glucose based on  . Lab Results  Component Value Date   HGBA1C 9.4 08/12/2011     ,home fasting CBG readings of 250-300 per oral report. Control is suboptimal due to immobility AND suboptimal dose of insulin. Denies hypoglycemic events.  Able to verbalize appropriate hypoglycemia management plan. Increased dose of basal insulin Lantus (insulin glargine)from 30-35 units daily.   Written patient instructions provided.  Follow up in  Pharmacist Clinic Visit in several weeks.   Total time in face to face counseling40 minutes.  Patient seen with Dr. Aviva Signs.

## 2011-10-17 NOTE — Progress Notes (Signed)
Patient ID: [name] [name], female   DOB: 10/13/1950, 61 y.o.   MRN: [id number] Reviewed and agree with Dr. Koval's documentation and management. 

## 2011-10-17 NOTE — Patient Instructions (Signed)
Increase Lantus to 35 units each day.  Try the longer needles.  Quit Smoking for the month of July.  Next visit with Pharmacy Clinic in Middle to Late July.  Bring your meter and Meds to next visit with Pharmacist.

## 2011-10-17 NOTE — Progress Notes (Signed)
  Subjective:    Patient ID: [name] [name], female    DOB: August 13, 1950, 61 y.o.   MRN: [id number]  HPI  Patient arrives in good spirits with medications. She did NOT bring blood glucose readings or meter but reports CBGs consistently high 200's to low 300s.   She denies CBG readings of 100-200.    She denies hypoglycemia.    Patient has been out of several medications and needs refills will defer this to Dr. Alvester Morin who she is seeing next today.    Review of Systems     Objective:   Physical Exam        Assessment & Plan:   Diabetes of many  yrs duration currently under fair control of blood glucose based on  . Lab Results  Component Value Date   HGBA1C 9.4 08/12/2011     ,home fasting CBG readings of 250-300 per oral report. Control is suboptimal due to immobility AND suboptimal dose of insulin. Denies hypoglycemic events.  Able to verbalize appropriate hypoglycemia management plan. Increased dose of basal insulin Lantus (insulin glargine)from 30-35 units daily.   Written patient instructions provided.  Follow up in  Pharmacist Clinic Visit in several weeks.   Total time in face to face counseling40 minutes.  Patient seen with Dr. Aviva Signs.

## 2011-10-19 NOTE — Assessment & Plan Note (Signed)
This has been a recurrent issue for this patient.  Sxs seem multifactorial with contributions MSK and GI sources.  Tylenol for costrochondritic sxs.  Continue PPI.  Normal nuclear stress test within last 10 months reassuring.  EKG reassuring. Unchanged from previous.  However, given CV red flags (HTN< DM. HLD), will formally refer pt back to cardiology for formal reevaulation.  NTG rx given for sxs.  Instructed pt to go to ED if sxs recur.  Handout given.

## 2011-10-19 NOTE — Progress Notes (Signed)
  Subjective:    Patient ID: [name] [name], female    DOB: March 02, 1951, 61 y.o.   MRN: [id number]  HPI Pt presents today for general follow up.  Pt had pharmacy clinic appointment earlier today.  Chief complaint is chest pain yesterday Pt states that she had 2 hours episode of CP.  CP involved entire CP, both L and R.  Some radiation to neck and jaw.  CP non exertional.  Mild nausea, no diaphoresis.  CP resolved spontaineously.  Had nuclear stress test 01/2011 that was negative for any inducible ischemia: Impression  Exercise Capacity: Lexiscan with no exercise.  BP Response: Normal blood pressure response.  Clinical Symptoms: No chest pain.  ECG Impression: No significant ST segment change suggestive of ischemia.  Comparison with Prior Nuclear Study: No previous nuclear study performed  Overall Impression: Normal stress nuclear study.   Sxs have been similar to reflux flares in th past.  Pt has had similar episodes of CP in the past per pt.  No active CP currently.    Review of Systems See HPI, otherwise 12 point OS negative     Objective:   Physical Exam Gen: up in chair, NAD HEENT: NCAT, EOMI, TMs clear bilaterally CV: RRR, no murmurs auscultated, + anterior chest wall tenderness to palpation.  PULM: CTAB, no wheezes, rales, rhoncii ABD: S/NT/+ bowel sounds  EXT: 2+ peripheral pulses  EKG: normal sinus rhythm, noted inverted t waves in T3 and T4. Unchanged from previous EKG.          Assessment & Plan:

## 2011-10-28 ENCOUNTER — Telehealth: Payer: Self-pay | Admitting: Emergency Medicine

## 2011-10-28 NOTE — Telephone Encounter (Signed)
Patient is calling because she lost the print out from her last appt and she needs another copy along with the list of her medications before 7/12 which is when she has her appt with the Cardiologist.  Please call her when it is ready for pick up.

## 2011-10-31 ENCOUNTER — Ambulatory Visit (INDEPENDENT_AMBULATORY_CARE_PROVIDER_SITE_OTHER): Payer: Medicare Other | Admitting: Cardiovascular Disease

## 2011-10-31 ENCOUNTER — Encounter: Payer: Self-pay | Admitting: *Deleted

## 2011-10-31 ENCOUNTER — Encounter: Payer: Self-pay | Admitting: Cardiovascular Disease

## 2011-10-31 VITALS — BP 180/86 | HR 100 | Ht 63.0 in | Wt 219.0 lb

## 2011-10-31 DIAGNOSIS — R079 Chest pain, unspecified: Secondary | ICD-10-CM | POA: Insufficient documentation

## 2011-10-31 LAB — BASIC METABOLIC PANEL
BUN: 11 mg/dL (ref 6–23)
CO2: 25 mEq/L (ref 19–32)
Calcium: 9.2 mg/dL (ref 8.4–10.5)
Chloride: 102 mEq/L (ref 96–112)
Creat: 0.68 mg/dL (ref 0.50–1.10)
Glucose, Bld: 253 mg/dL — ABNORMAL HIGH (ref 70–99)
Potassium: 3.8 mEq/L (ref 3.5–5.3)
Sodium: 136 mEq/L (ref 135–145)

## 2011-10-31 LAB — PROTIME-INR
INR: 0.98 (ref <1.50)
Prothrombin Time: 13.4 seconds (ref 11.6–15.2)
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
## 2011-10-31 NOTE — Assessment & Plan Note (Signed)
She has multiple risk factors for CAD including HTN, HLD, DM, obesity and FH of CAD. Her chest pain is concerning for unstable angina. Will arrange cardiac cath to exclude CAD. R/B reviewed. Will arrange for 11/10/11 in outpatient lab. Labs today.

## 2011-10-31 NOTE — Patient Instructions (Addendum)
Your physician recommends that you schedule a follow-up appointment in: 6 weeks.   Your physician has requested that you have a cardiac catheterization. Cardiac catheterization is used to diagnose and/or treat various heart conditions. Doctors may recommend this procedure for a number of different reasons. The most common reason is to evaluate chest pain. Chest pain can be a symptom of coronary artery disease (CAD), and cardiac catheterization can show whether plaque is narrowing or blocking your heart's arteries. This procedure is also used to evaluate the valves, as well as measure the blood flow and oxygen levels in different parts of your heart. For further information please visit [email or web address] Please follow instruction sheet, as given. Scheduled for November 10, 2011

## 2011-11-01 LAB — CBC WITH DIFFERENTIAL/PLATELET
Basophils Absolute: 0 10*3/uL (ref 0.0–0.1)
Basophils Relative: 1 % (ref 0–1)
Eosinophils Absolute: 0.1 10*3/uL (ref 0.0–0.7)
Eosinophils Relative: 2 % (ref 0–5)
HCT: 39.3 % (ref 36.0–46.0)
Hemoglobin: 13 g/dL (ref 12.0–15.0)
Lymphocytes Relative: 43 % (ref 12–46)
Lymphs Abs: 3 10*3/uL (ref 0.7–4.0)
MCH: 27.5 pg (ref 26.0–34.0)
MCHC: 33.1 g/dL (ref 30.0–36.0)
MCV: 83.1 fL (ref 78.0–100.0)
Monocytes Absolute: 0.4 10*3/uL (ref 0.1–1.0)
Monocytes Relative: 5 % (ref 3–12)
Neutro Abs: 3.5 10*3/uL (ref 1.7–7.7)
Neutrophils Relative %: 49 % (ref 43–77)
Platelets: 190 10*3/uL (ref 150–400)
RBC: 4.73 MIL/uL (ref 3.87–5.11)
RDW: 13.5 % (ref 11.5–15.5)
WBC: 7 10*3/uL (ref 4.0–10.5)

## 2011-11-03 ENCOUNTER — Ambulatory Visit: Payer: Medicare Other | Admitting: Pharmacist

## 2011-11-04 ENCOUNTER — Encounter: Payer: Self-pay | Admitting: Pharmacist

## 2011-11-04 ENCOUNTER — Ambulatory Visit (INDEPENDENT_AMBULATORY_CARE_PROVIDER_SITE_OTHER): Payer: Medicare Other | Admitting: Emergency Medicine

## 2011-11-04 ENCOUNTER — Ambulatory Visit (INDEPENDENT_AMBULATORY_CARE_PROVIDER_SITE_OTHER): Payer: Medicare Other | Admitting: Pharmacist

## 2011-11-04 ENCOUNTER — Encounter: Payer: Self-pay | Admitting: Emergency Medicine

## 2011-11-04 VITALS — BP 132/70 | HR 92 | Ht 63.0 in | Wt 217.0 lb

## 2011-11-04 DIAGNOSIS — K3184 Gastroparesis: Secondary | ICD-10-CM

## 2011-11-04 DIAGNOSIS — G894 Chronic pain syndrome: Secondary | ICD-10-CM

## 2011-11-04 DIAGNOSIS — Z0000 Encounter for general adult medical examination without abnormal findings: Secondary | ICD-10-CM

## 2011-11-04 DIAGNOSIS — E119 Type 2 diabetes mellitus without complications: Secondary | ICD-10-CM

## 2011-11-04 DIAGNOSIS — E1143 Type 2 diabetes mellitus with diabetic autonomic (poly)neuropathy: Secondary | ICD-10-CM

## 2011-11-04 DIAGNOSIS — E1142 Type 2 diabetes mellitus with diabetic polyneuropathy: Secondary | ICD-10-CM

## 2011-11-04 DIAGNOSIS — F17200 Nicotine dependence, unspecified, uncomplicated: Secondary | ICD-10-CM

## 2011-11-04 DIAGNOSIS — E1149 Type 2 diabetes mellitus with other diabetic neurological complication: Secondary | ICD-10-CM

## 2011-11-04 LAB — POCT GLYCOSYLATED HEMOGLOBIN (HGB A1C): Hemoglobin A1C: 10.3

## 2011-11-04 MED ORDER — GABAPENTIN 400 MG PO CAPS: 800.0000 mg | ORAL_CAPSULE | Freq: Three times a day (TID) | ORAL | Status: DC

## 2011-11-04 MED ORDER — NORTRIPTYLINE HCL 25 MG PO CAPS: 50.0000 mg | ORAL_CAPSULE | Freq: Every day | ORAL | Status: DC

## 2011-11-04 MED ORDER — METOCLOPRAMIDE HCL 10 MG PO TABS: 10.0000 mg | ORAL_TABLET | Freq: Three times a day (TID) | ORAL | Status: DC

## 2011-11-04 NOTE — Progress Notes (Signed)
  Subjective:    Patient ID: [name], female    DOB: 08/26/1950, 61 y.o.   MRN: [id number]  HPI Sally Menard is here for follow up.  1. Meet new MD: I have reviewed and updated the following as appropriate: allergies, current medications, past family history, past medical history, past social history and past surgical history.  2. Nausea: Patient reports continued nausea that is worse in the morning and worse with meals.  No vomiting in the last month.  Known history of gastroparesis.  3. Chronic pain: Patient reports needling pains in left shoulders, back, hands, and ankles.  Also reports a "crawling" feeling all over her body.  Able to sleep, but present throughout the day.  No clear report of when it started, but at least 1 year ago.   Review of Systems See HPI    Objective:   Physical Exam BP 132/70  Pulse 92  Ht 5\' 3"  (1.6 m)  Wt 217 lb (98.431 kg)  BMI 38.44 kg/m2 Gen: alert, cooperative, NAD HEENT: AT/[place], sclera white, MMM CV: RRR, no murmurs Pulm: CTAB Abd: no tenderness Ext: 1+ edema to knees bilaterally Skin: no rashes or lesions Neuro: no abdnormal movements      Assessment & Plan:

## 2011-11-04 NOTE — Assessment & Plan Note (Signed)
Discussed medications, allergies and history as this is my first appointment with this patient.  Will follow up in 1 week to discuss diabetes and dry eyes.

## 2011-11-04 NOTE — Assessment & Plan Note (Signed)
mild Nicotine Dependence of many years duration in a patient who is fair candidate for success b/c of current low level of motivation and stress.  She is however willing to set a goal 5 cigs per day by August 1st and then followup in Rx clinic for additional goal setting including possible cessation date.    Written information provided.  F/U Rx Clinic Visit in August  Total time in face-to-face counseling 20 minutes.  Patient seen with daughter of patient.

## 2011-11-04 NOTE — Assessment & Plan Note (Signed)
Needle pains likely related muscle spasms.  Crawly sensation may be related to medications or pain syndrome.  Will try increasing gabapentin to 800mg  TID.  May need to consider adjusting seroquel (either up or down) if no improvement.  Also increased nortriptyline to 50mg  qHS and confiscated the amitriptyline her urologist had given her.

## 2011-11-04 NOTE — Patient Instructions (Signed)
We are changing some of your medicines. 1. STOP amitriptyline 2. Increase Nortriptyline to 50mg  daily 3. Increase gabapentin to 800mg  three times a day 4. Reglan 10mg  20 minute before every meal.  I will see you back in 1-2 weeks to talk about your diabetes.

## 2011-11-04 NOTE — Assessment & Plan Note (Signed)
Continue nausea, worse with meals.  Will give reglan.  Instructed to take 20 minutes prior to meals.

## 2011-11-04 NOTE — Progress Notes (Signed)
  Subjective:    Patient ID: [name] [name], female    DOB: 08/05/1950, 61 y.o.   MRN: [id number]  HPI  Patient arrives with daughter after appt with Dr. Elwyn Reach.   She is interested in talking about tobacco cessation however she reports continued stress and is not motivated to quit at this time.  She is however willing to consider a gradual reduction in cigarette use as a short-term goal.   Currently smoking 7 cigs per day and reports letting many of these burn up in the ash tray.    Review of Systems     Objective:   Physical Exam        Assessment & Plan:  mild Nicotine Dependence of many years duration in a patient who is fair candidate for success b/c of current low level of motivation and stress.  She is however willing to set a goal 5 cigs per day by August 1st and then followup in Rx clinic for additional goal setting including possible cessation date.    Written information provided.  F/U Rx Clinic Visit in August  Total time in face-to-face counseling 20 minutes.  Patient seen with daughter of patient.

## 2011-11-10 ENCOUNTER — Inpatient Hospital Stay (HOSPITAL_BASED_OUTPATIENT_CLINIC_OR_DEPARTMENT_OTHER)
Admission: RE | Admit: 2011-11-10 | Discharge: 2011-11-10 | Disposition: A | Discharge status: Home / Self Care | Payer: Medicare Other | Source: Ambulatory Visit | Attending: Cardiovascular Disease | Admitting: Cardiovascular Disease

## 2011-11-10 ENCOUNTER — Encounter (HOSPITAL_BASED_OUTPATIENT_CLINIC_OR_DEPARTMENT_OTHER)
Admission: RE | Disposition: A | Discharge status: Home / Self Care | Payer: Self-pay | Source: Ambulatory Visit | Attending: Cardiovascular Disease

## 2011-11-10 DIAGNOSIS — E119 Type 2 diabetes mellitus without complications: Secondary | ICD-10-CM | POA: Insufficient documentation

## 2011-11-10 DIAGNOSIS — I2510 Atherosclerotic heart disease of native coronary artery without angina pectoris: Secondary | ICD-10-CM | POA: Insufficient documentation

## 2011-11-10 DIAGNOSIS — E785 Hyperlipidemia, unspecified: Secondary | ICD-10-CM | POA: Insufficient documentation

## 2011-11-10 DIAGNOSIS — I10 Essential (primary) hypertension: Secondary | ICD-10-CM | POA: Insufficient documentation

## 2011-11-10 DIAGNOSIS — R079 Chest pain, unspecified: Secondary | ICD-10-CM

## 2011-11-10 DIAGNOSIS — R0789 Other chest pain: Secondary | ICD-10-CM | POA: Insufficient documentation

## 2011-11-10 DIAGNOSIS — R0602 Shortness of breath: Secondary | ICD-10-CM | POA: Insufficient documentation

## 2011-11-10 DIAGNOSIS — K219 Gastro-esophageal reflux disease without esophagitis: Secondary | ICD-10-CM | POA: Insufficient documentation

## 2011-11-10 DIAGNOSIS — Z8673 Personal history of transient ischemic attack (TIA), and cerebral infarction without residual deficits: Secondary | ICD-10-CM | POA: Insufficient documentation

## 2011-11-10 SURGERY — JV LEFT HEART CATHETERIZATION WITH CORONARY ANGIOGRAM
Anesthesia: Moderate Sedation

## 2011-11-10 MED ORDER — ASPIRIN 81 MG PO CHEW
324.0000 mg | CHEWABLE_TABLET | ORAL | Status: AC
  Administered 2011-11-10: 324 mg via ORAL

## 2011-11-10 MED ORDER — SODIUM CHLORIDE 0.9 % IV SOLN: INTRAVENOUS | Status: DC

## 2011-11-10 MED ORDER — SODIUM CHLORIDE 0.9 % IJ SOLN: 3.0000 mL | Freq: Two times a day (BID) | INTRAMUSCULAR | Status: DC

## 2011-11-10 MED ORDER — SODIUM CHLORIDE 0.9 % IJ SOLN: 3.0000 mL | INTRAMUSCULAR | Status: DC | PRN

## 2011-11-10 MED ORDER — ACETAMINOPHEN 325 MG PO TABS: 650.0000 mg | ORAL_TABLET | ORAL | Status: DC | PRN

## 2011-11-10 MED ORDER — DIAZEPAM 5 MG PO TABS
5.0000 mg | ORAL_TABLET | ORAL | Status: AC
  Administered 2011-11-10: 5 mg via ORAL

## 2011-11-10 MED ORDER — SODIUM CHLORIDE 0.9 % IV SOLN
250.0000 mL | INTRAVENOUS | Status: DC | PRN
Start: 2011-11-10 — End: 2011-11-10

## 2011-11-10 MED ORDER — SODIUM CHLORIDE 0.9 % IV SOLN: INTRAVENOUS | Status: AC

## 2011-11-10 NOTE — OR Nursing (Signed)
Discharge instructions reviewed and signed, pt stated understanding, ambulated in hall without difficulty, site level 0, transported to husband's car via wheelchair 

## 2011-11-10 NOTE — OR Nursing (Signed)
Meal served 

## 2011-11-10 NOTE — OR Nursing (Signed)
Tegaderm dressing applied, site level 0, bedrest begins at 1055 

## 2011-11-10 NOTE — OR Nursing (Signed)
Dr McAlhany at bedside to discuss results and treatment plan with pt and family 

## 2011-11-10 NOTE — CV Procedure (Addendum)
   Cardiac Catheterization Operative Report  [name] [name] [id number] 7/22/201310:40 AM BOOTH, Denny Peon, MD  Procedure Performed:  1. Left Heart Catheterization 2. Selective Coronary Angiography 3. Left ventricular angiogram  Operator: Verne Carrow, MD  Indication:  Chest pain                              Procedure Details: The risks, benefits, complications, treatment options, and expected outcomes were discussed with the patient. The patient and/or family concurred with the proposed plan, giving informed consent. The patient was brought to the cath lab after IV hydration was begun and oral premedication was given. The patient was further sedated with Versed and Fentanyl. The right groin was prepped and draped in the usual manner. Using the modified Seldinger access technique, a 4 French sheath was placed in the right femoral artery. Standard diagnostic catheters were used to perform selective coronary angiography. A pigtail catheter was used to perform a left ventricular angiogram.  There were no immediate complications. The patient was taken to the recovery area in stable condition.   Hemodynamic Findings: Central aortic pressure: 121/57 Left ventricular pressure: 125/22/20  Angiographic Findings:  Left main: No obstructive disease noted.   Left Anterior Descending Artery: Large caliber vessel that courses to the apex with no obstructive disease noted.   Circumflex Artery: Moderated sized vessel with moderate sized bifurcating OM branch with no disease noted.   Right Coronary Artery: Large, dominant vessel with 25% plaque mid vessel.   Left Ventricular Angiogram: LVEF 65-70%.   Impression: 1. Mild non-obstructive CAD 2. Normal LV systolic function 3. Non-cardiac chest pain  Recommendations: Will continue ASA and statin.        Complications:  None. The patient tolerated the procedure well.
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
## 2011-11-10 NOTE — Interval H&P Note (Signed)
History and Physical Interval Note:  11/10/2011 10:06 AM  [name] [name]  has presented today for surgery, with the diagnosis of CP  The various methods of treatment have been discussed with the patient and family. After consideration of risks, benefits and other options for treatment, the patient has consented to  Procedure(s) (LRB): JV LEFT HEART CATHETERIZATION WITH CORONARY ANGIOGRAM (N/A) as a surgical intervention .  The patient's history has been reviewed, patient examined, no change in status, stable for surgery.  I have reviewed the patient's chart and labs.  Questions were answered to the patient's satisfaction.     MCALHANY,[name]

## 2011-11-14 ENCOUNTER — Encounter: Payer: Self-pay | Admitting: Emergency Medicine

## 2011-11-14 ENCOUNTER — Ambulatory Visit (INDEPENDENT_AMBULATORY_CARE_PROVIDER_SITE_OTHER): Payer: Medicare Other | Admitting: Emergency Medicine

## 2011-11-14 VITALS — BP 140/79 | HR 93 | Ht 63.0 in | Wt 226.0 lb

## 2011-11-14 DIAGNOSIS — G894 Chronic pain syndrome: Secondary | ICD-10-CM

## 2011-11-14 DIAGNOSIS — E119 Type 2 diabetes mellitus without complications: Secondary | ICD-10-CM

## 2011-11-14 DIAGNOSIS — I10 Essential (primary) hypertension: Secondary | ICD-10-CM

## 2011-11-14 MED ORDER — INSULIN GLARGINE 100 UNIT/ML ~~LOC~~ SOLN: 38.0000 [IU] | Freq: Every day | SUBCUTANEOUS | Status: DC

## 2011-11-14 MED ORDER — CYCLOBENZAPRINE HCL 10 MG PO TABS: 10.0000 mg | ORAL_TABLET | Freq: Three times a day (TID) | ORAL | Status: DC | PRN

## 2011-11-14 MED ORDER — LISINOPRIL 20 MG PO TABS: 40.0000 mg | ORAL_TABLET | Freq: Every day | ORAL | Status: DC

## 2011-11-14 MED ORDER — GUAIFENESIN ER 600 MG PO TB12: 600.0000 mg | ORAL_TABLET | Freq: Two times a day (BID) | ORAL | Status: DC

## 2011-11-14 NOTE — Progress Notes (Signed)
  Subjective:    Patient ID: [name] [name], female    DOB: 04/06/1951, 61 y.o.   MRN: [id number]  HPI [name] [name] is here for follow up.  Diabetes Well controlled: no Compliant with medications: yes Side effects from medications: no Check sugars at home: yes  Sugar ranges: 180 fasting, 235 random  Polyuria: no Polydipsia: no Vision changes: no Hypoglycemic symptoms: no  Foot exam: done today Eye exam: 06/2011 Microalbumin: n/a  Back Spasm Related to work accident 3 years ago.  Located in lower back, no radiation.  Reports flares every 2-3 weeks that last 3-4 days.  No associated weakness or paresthesias.   Hypertension Well controlled: yes Compliant with medication: yes Side effects from medication: yes LE edema Check BP at home: no  Chest pain: no Palpitations: no Vision changes: no Leg edema: yes Dizziness: no  I have reviewed and updated the following as appropriate: allergies and current medications  Review of Systems     Objective:   Physical Exam BP 140/79  Pulse 93  Ht 5\' 3"  (1.6 m)  Wt 226 lb (102.513 kg)  BMI 40.03 kg/m2 Gen: alert, cooperative, NAD HEENT: AT/[place], sclera white, MMM CV: RRR, no murmurs Pulm: CTAB, no wheezes or rales Back: mild diffuse tenderness over low back Ext: 2+ pitting edema to knees, some hyperpigmentation of shins Foot: 0/5 monofilament on left, 4/5 monofilament on right; no open wounds or lesions      Assessment & Plan:

## 2011-11-14 NOTE — Assessment & Plan Note (Addendum)
Complaining of primarily back spasms today.  Will give flexeril to take on a prn basis.

## 2011-11-14 NOTE — Assessment & Plan Note (Signed)
Not controlled. A1c 10.3.  Will increase Lantus to 38 units daily.  Instructed to check fasting CBGs daily, if <100 decrease lantus back to 35 units.  Would likely benefit from meal time insulin as well.

## 2011-11-14 NOTE — Patient Instructions (Addendum)
It was good to see you again!  - START taking lantus 38 units every morning.  If your blood sugar first thing in the morning is less than 100, go back to 35 units. - I have sent a prescription for flexeril.  This is to help with your back spasms.  You can take it up to 3 times a day as needed. - STOP taking your amlodipine - this might be contributing to your leg swelling - INCREASE your lisinopril to 40mg  daily (take 2 tabs once a day).  Please schedule a blood pressure check in 2-4 weeks.  Follow up with me in 3 months or sooner as needed.

## 2011-11-14 NOTE — Assessment & Plan Note (Addendum)
Good control.  Will stop amlodipine given LE edema (suspect mostly from venous insufficiency).  Will increase lisinopril to 40mg  daily.  Follow up with RN BP check in 2 weeks.

## 2011-11-25 ENCOUNTER — Ambulatory Visit (INDEPENDENT_AMBULATORY_CARE_PROVIDER_SITE_OTHER): Payer: Medicare Other | Admitting: Cardiovascular Disease

## 2011-11-25 ENCOUNTER — Encounter: Payer: Self-pay | Admitting: Cardiovascular Disease

## 2011-11-25 VITALS — BP 148/72 | HR 80 | Ht 63.0 in | Wt 222.0 lb

## 2011-11-25 DIAGNOSIS — I2510 Atherosclerotic heart disease of native coronary artery without angina pectoris: Secondary | ICD-10-CM

## 2011-11-25 DIAGNOSIS — R079 Chest pain, unspecified: Secondary | ICD-10-CM
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
## 2011-11-25 NOTE — Assessment & Plan Note (Signed)
Noncardiac

## 2011-11-25 NOTE — Assessment & Plan Note (Signed)
She has mild disease in the RCA by cath 11/10/11. Will continue statin. Will check lipids and LFTS today. Risk factor modification. Smoking cessation encouraged.

## 2011-11-25 NOTE — Patient Instructions (Signed)
Your physician wants you to follow-up in:  12 months.  You will receive a reminder letter in the mail two months in advance. If you don't receive a letter, please call our office to schedule the follow-up appointment.   

## 2011-12-01 ENCOUNTER — Other Ambulatory Visit (INDEPENDENT_AMBULATORY_CARE_PROVIDER_SITE_OTHER): Payer: Medicare Other

## 2011-12-01 DIAGNOSIS — I2510 Atherosclerotic heart disease of native coronary artery without angina pectoris: Secondary | ICD-10-CM

## 2011-12-01 LAB — LIPID PANEL
Cholesterol: 136 mg/dL (ref 0–200)
HDL: 40.7 mg/dL (ref >39.00)
Total CHOL/HDL Ratio: 3
Triglycerides: 307 mg/dL — ABNORMAL HIGH (ref 0.0–149.0)
VLDL: 61.4 mg/dL — ABNORMAL HIGH (ref 0.0–40.0)

## 2011-12-01 LAB — HEPATIC FUNCTION PANEL
ALT: 25 U/L (ref 0–35)
AST: 34 U/L (ref 0–37)
Albumin: 3.9 g/dL (ref 3.5–5.2)
Alkaline Phosphatase: 107 U/L (ref 39–117)
Bilirubin, Direct: 0.1 mg/dL (ref 0.0–0.3)
Total Bilirubin: 0.5 mg/dL (ref 0.3–1.2)
Total Protein: 7.8 g/dL (ref 6.0–8.3)

## 2011-12-01 LAB — LDL CHOLESTEROL, DIRECT: Direct LDL: 62.5 mg/dL

## 2011-12-04 ENCOUNTER — Ambulatory Visit (INDEPENDENT_AMBULATORY_CARE_PROVIDER_SITE_OTHER): Payer: Medicare Other | Admitting: *Deleted

## 2011-12-04 ENCOUNTER — Telehealth: Payer: Self-pay | Admitting: *Deleted

## 2011-12-04 ENCOUNTER — Ambulatory Visit (INDEPENDENT_AMBULATORY_CARE_PROVIDER_SITE_OTHER): Payer: Medicare Other | Admitting: Family Medicine

## 2011-12-04 VITALS — BP 131/78 | HR 96 | Temp 99.4°F | Ht 63.0 in | Wt 221.0 lb

## 2011-12-04 DIAGNOSIS — E119 Type 2 diabetes mellitus without complications: Secondary | ICD-10-CM

## 2011-12-04 LAB — GLUCOSE, CAPILLARY: Glucose-Capillary: 361 mg/dL — ABNORMAL HIGH (ref 70–99)

## 2011-12-04 MED ORDER — IBUPROFEN 600 MG PO TABS: 600.0000 mg | ORAL_TABLET | Freq: Three times a day (TID) | ORAL | Status: DC | PRN

## 2011-12-04 MED ORDER — INSULIN GLARGINE 100 UNIT/ML ~~LOC~~ SOLN: 43.0000 [IU] | Freq: Every day | SUBCUTANEOUS | Status: DC

## 2011-12-04 NOTE — Progress Notes (Signed)
Patient arrived approx  11:45 AM today thinking she had appointment with Dr. Raymondo Band today , was actually  scheduled for tomorrow . However that appointment was to be cancelled because Dr. Raymondo Band is out.  Patient states she drank an Ensure about 9:00 AM then at 10:00 checked BS and was 503.       CBG done while in office and is 361. Patient states she has numbness of hands, nausea  and no appetite. This has been going on since was diagnosed with diabetes several years ago. States she doesn't eat breakfast or lunch but does have meal at dinner time.  Drinks some type of shake  in the AM . However yesterday around 3:00 PM she did have a sundae from [employer].    Consulted with Dr. Earnest Bailey and she advised patient needs appointment today to be evaluated for elevated BS and possible insulin adjustment.   Appointment also scheduled with PCP for next week.Marland Kitchen Appointment scheduled for work in today.

## 2011-12-04 NOTE — Patient Instructions (Addendum)
For your sugars: -Take 10 units of your Lantus when you get home -Starting tomorrow morning take 43 units  For your headache: -Try ibuprofen 600 mg every 8 hours as needed  If I do not call you tomorrow by 2:00 pm, then please call the clinic and let me know what your sugars are  MEASURE YOUR SUGARS: 1. In the morning when you wake up (before your first meal) 2. After your biggest meal of the day 3. At bedtime

## 2011-12-04 NOTE — Telephone Encounter (Signed)
Form for handicap sticker placed in MD box.

## 2011-12-05 ENCOUNTER — Ambulatory Visit: Payer: Medicare Other | Admitting: Pharmacist

## 2011-12-05 ENCOUNTER — Telehealth: Payer: Self-pay | Admitting: Emergency Medicine

## 2011-12-05 NOTE — Assessment & Plan Note (Signed)
Presenting with hyperglycemia today to 503 after drinking Ensure shake.  Glc has gone down to 361 here in clinic about 6 hours after she drank the Ensure. Her BS usually run upper 200 to mid-300s -Advised to take 10 extra U this afternoon after she gets home. Advised to increase Lantus from 38 U in the AM to 43 U -Advised to measure BS three times daily, in the AM, after biggest meal, and qhs -Avoid Ensure and high carbohydrate foods -Patient amenable to nutrition consult. Will refer -Keep follow-up appointment with PCP next week

## 2011-12-05 NOTE — Telephone Encounter (Signed)
Called pt to get BS readings  378 @ 9 AM 12/05/11 413 @ 2 PM 12/05/11  She will take the last one around 11 PM. Pt drank a Lucerna around 3 pm.

## 2011-12-05 NOTE — Progress Notes (Signed)
  Subjective:    Patient ID: [name] [name], female    DOB: September 29, 1950, 61 y.o.   MRN: [id number]  HPI # Elevated BS, history of poorly controlled Type 2 DM She drank an Ensure or some other nutritional shake today (now low carbohydrate) and her sugar was 503 when she checked it around 10:00 am. She usually does not drink these shakes It is 361 in the office today.  Her sugars July 30-August 15 have been running 277-340 (outlier of 402)  She reports mild-moderate diffuse frontal headache. She does not have a history of headaches and has not taken anything for the headache. She is complaining of some blurred vision. No hearing changes. No gait instability. No falls.  Complaining of polyuria and polydipsia.   Review of Systems Denies new medications, changes in diet, recent illness Denies fevers/chills Denies nausea/vomiting/diarrhea/constipation Denies dysuria Complaining of her hands being cold  Allergies, medication, past medical history reviewed.  -History of T2DM (HgbA1c 07/16 10.3 and insulin had been increased from 20 U bid to 38 U qAM at that time) with gastroparesis on Reglan and neuropathy 0Depression with psychotic behavior -Tobacco use -Chronic pain -HTN>>>controlled today -GERD -Urinary incontinence -Recurrent pancreatitis>>>avoids Tylenol -CAD (No history of renal disease, last Cr WNL)    Objective:   Physical Exam GEN: NAD; well-nourished, -appearing PSYCH: anxious-appearing; normally conversant; appropriate to questions; alert and oriented HEENT: MMM CV: RRR, no m/r/g PULM: NI WOB; CTAB without w/r/r NEURO: grossly intact SKIN: normal skin turgor    Assessment & Plan:

## 2011-12-05 NOTE — Telephone Encounter (Signed)
Granddaughter notified Handicap Placard is ready to be picked up at front desk. [name] [name]

## 2011-12-05 NOTE — Telephone Encounter (Signed)
Formed filled out and given to [employer].

## 2011-12-05 NOTE — Telephone Encounter (Signed)
Patient is calling to give BS readings.

## 2011-12-08 ENCOUNTER — Ambulatory Visit: Payer: Medicare Other | Admitting: Cardiovascular Disease

## 2011-12-09 ENCOUNTER — Ambulatory Visit: Payer: Medicare Other | Admitting: Emergency Medicine

## 2011-12-17 ENCOUNTER — Encounter: Payer: Self-pay | Admitting: Emergency Medicine

## 2011-12-17 ENCOUNTER — Ambulatory Visit (INDEPENDENT_AMBULATORY_CARE_PROVIDER_SITE_OTHER): Payer: Medicare Other | Admitting: Emergency Medicine

## 2011-12-17 VITALS — BP 128/84 | HR 89 | Ht 63.0 in | Wt 225.0 lb

## 2011-12-17 DIAGNOSIS — E119 Type 2 diabetes mellitus without complications: Secondary | ICD-10-CM

## 2011-12-17 DIAGNOSIS — K625 Hemorrhage of anus and rectum: Secondary | ICD-10-CM

## 2011-12-17 DIAGNOSIS — R05 Cough: Secondary | ICD-10-CM

## 2011-12-17 DIAGNOSIS — R059 Cough, unspecified: Secondary | ICD-10-CM

## 2011-12-17 MED ORDER — LEVOCETIRIZINE DIHYDROCHLORIDE 5 MG PO TABS: 5.0000 mg | ORAL_TABLET | Freq: Every evening | ORAL | Status: DC

## 2011-12-17 MED ORDER — HYDROCORTISONE 2.5 % RE CREA: TOPICAL_CREAM | Freq: Two times a day (BID) | RECTAL | Status: DC

## 2011-12-17 MED ORDER — INSULIN GLARGINE 100 UNIT/ML ~~LOC~~ SOLN: 50.0000 [IU] | Freq: Every day | SUBCUTANEOUS | Status: DC

## 2011-12-17 NOTE — Progress Notes (Signed)
  Subjective:    Patient ID: [name] [name], female    DOB: Feb 09, 1951, 61 y.o.   MRN: [id number]  HPI [name] [name] is here for follow up.  Diabetes Reports fasting sugars in the 350-400 range.  Compliant with Lantus.  Cough Continues to have cough.  State Mucinex helped while she was taking it, but the cough returned when she stopped taking it (and it was expensive).  Can feel something draining down her throat, particularly at night. Cough productive of clear to white mucous.  Has had alternating dry and runny nose.  + itchy, watery eyes.  Reports a daily "film" over her eyes that causes some blurry vision and requires her to wipe her eyes frequently.  Also has a "crawly" sensation in her ears with itching and occasional pain/swelling of ear canal.  Rectal bleeding Reports that on Friday she had a BM that was associated with bright red blood per rectum.  States it was "some" blood, enough to have the toilet water turn redish.  Denies any clots.  Had a second episode of bleeding with BM yesterday that was not as bad.  No pain.  + straining with BMs, but stool not hard.  Has used hydrocortisone suppositories with good relief in the past.  States she is due for a colonoscopy.  Reports history of rectal bleeding in the past that required what sounds like electrocautery.    I have reviewed and updated the following as appropriate: allergies and current medications  Review of Systems See HPI    Objective:   Physical Exam BP 128/84  Pulse 89  Ht 5\' 3"  (1.6 m)  Wt 225 lb (102.059 kg)  BMI 39.86 kg/m2 Gen: alert, cooperative, NAD HEENT: AT/[place], sclera white, clear rhinorrhea, cobblestoning of posterior pharynx without erythema or exudates, TMs and ears normal bilaterally Neck: supple, no LAD CV: RRR, no murmurs Pulm: CTAB Rectal: small external hemorrhoids noted, limited by pain with exam, no blood noted, good tone Ext: trace to 1+ edema to knees bilaterally     Assessment & Plan:

## 2011-12-17 NOTE — Assessment & Plan Note (Signed)
Uncontrolled.  Fasting sugars in 350-400 range.  Increase Lantus to 50 units daily.  Would likely benefit from meal time insulin, but will work on getting fasting sugars under control first.  Patient to call with fasting sugars in 1 week.  Follow up 1 month.

## 2011-12-17 NOTE — Assessment & Plan Note (Signed)
Likely hemorrhoidal bleeding.  However, sounds like has had a diverticular vs AVM in the past.  Currently improved and no blood seen on rectal exam.  Will provide rectal steroid cream for possible hemorrhoids.  Will also refer to GI as it is time for a colonoscopy.

## 2011-12-17 NOTE — Patient Instructions (Addendum)
It was nice to see you again!  For the cough, nose, eyes, ears - we are going to try Xyzal, take 1 tablet daily.  For the rectal bleeding - I did not see anything on exam today. I refilled a rectal hydrocortisone cream.  I have placed a referral to GI, they should be calling you with an appointment in the next day or two.  For the diabetes - please take 50 units of Lantus daily.  Call me with your fasting blood sugars in 1-2 weeks.  Also, call me if the pharmacy is charging you more than normal.  Follow up in 1 month.

## 2011-12-17 NOTE — Assessment & Plan Note (Signed)
Likely related to post nasal drip vs GERD vs ACEI.  Post nasal drip seems most likely given associated allergic symptoms.  Given report of no response to loratadine and flonase will try Xyzal.  No indication for imaging or concern for infection at this time.

## 2011-12-18 ENCOUNTER — Telehealth: Payer: Self-pay | Admitting: *Deleted
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
## 2011-12-19 ENCOUNTER — Encounter: Payer: Self-pay | Admitting: Dietician

## 2011-12-19 ENCOUNTER — Encounter: Payer: Medicare Other | Attending: Family Medicine | Admitting: Dietician

## 2011-12-19 VITALS — Ht 63.0 in | Wt 209.9 lb

## 2011-12-19 DIAGNOSIS — E1143 Type 2 diabetes mellitus with diabetic autonomic (poly)neuropathy: Secondary | ICD-10-CM

## 2011-12-19 DIAGNOSIS — K3184 Gastroparesis: Secondary | ICD-10-CM | POA: Insufficient documentation

## 2011-12-19 DIAGNOSIS — E1149 Type 2 diabetes mellitus with other diabetic neurological complication: Secondary | ICD-10-CM | POA: Insufficient documentation

## 2011-12-19 DIAGNOSIS — E119 Type 2 diabetes mellitus without complications: Secondary | ICD-10-CM

## 2011-12-19 DIAGNOSIS — Z713 Dietary counseling and surveillance: Secondary | ICD-10-CM | POA: Insufficient documentation

## 2011-12-19 NOTE — Patient Instructions (Addendum)
   Discuss with your MD the use of the Glucerna as a meal on the day that your appetite is just not there and you use it as your meal.  Ask for a prescription for the Glucerna.  To get the appetite working, use small servings of foods.  In the morning try to have PB cracker as your breakfast.  Work to get the information to get on applying for Medicaid.  Use the information and work with your granddaughter to fill out the application.  [name] have to get your MD to fill out the material.  Currently, your insulin is not covering your glucose needs, the medicaid might help with the purchase of the extra insulin.   Get MD prescription for glucose monitoring strips for the One Touch Ultra Mini Meter.
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
## 2011-12-23 ENCOUNTER — Ambulatory Visit (INDEPENDENT_AMBULATORY_CARE_PROVIDER_SITE_OTHER): Payer: Medicare Other | Admitting: Pharmacist

## 2011-12-23 ENCOUNTER — Encounter: Payer: Self-pay | Admitting: Pharmacist

## 2011-12-23 VITALS — BP 181/110 | HR 112 | Ht 63.0 in | Wt 226.3 lb

## 2011-12-23 DIAGNOSIS — E119 Type 2 diabetes mellitus without complications: Secondary | ICD-10-CM

## 2011-12-23 DIAGNOSIS — F17200 Nicotine dependence, unspecified, uncomplicated: Secondary | ICD-10-CM

## 2011-12-23 DIAGNOSIS — I10 Essential (primary) hypertension: Secondary | ICD-10-CM

## 2011-12-23 MED ORDER — METOPROLOL TARTRATE 50 MG PO TABS: 150.0000 mg | ORAL_TABLET | Freq: Two times a day (BID) | ORAL | Status: DC

## 2011-12-23 MED ORDER — INSULIN GLARGINE 100 UNIT/ML ~~LOC~~ SOLN: 60.0000 [IU] | Freq: Every day | SUBCUTANEOUS | Status: DC

## 2011-12-23 MED ORDER — ONDANSETRON HCL 4 MG PO TABS: 4.0000 mg | ORAL_TABLET | Freq: Three times a day (TID) | ORAL | Status: DC | PRN

## 2011-12-23 MED ORDER — NYSTATIN 100000 UNIT/ML MT SUSP: 500000.0000 [IU] | Freq: Four times a day (QID) | OROMUCOSAL | Status: DC

## 2011-12-23 MED ORDER — GLUCOSE BLOOD VI STRP: ORAL_STRIP | Status: DC

## 2011-12-23 NOTE — Progress Notes (Signed)
Patient ID: [name] [name], female   DOB: July 06, 1950, 61 y.o.   MRN: [id number] Reviewed and agree with Dr. Macky Lower management and documentation.

## 2011-12-23 NOTE — Assessment & Plan Note (Signed)
moderate Nicotine Dependence of 30 years duration in a patient who is poor candidate for success b/c of lack of previous success and current level of motivation.     She currently has nicotine gum, which she reports using sometimes. Patient counseled on purpose, proper use, and potential adverse effects, including   Written information provided. Provided information on 1 800-QUIT NOW support program.  F/U Rx Clinic Visit in early October.  She set the goal to have no more than 4 cigarettes/day between now and next visit with Dr. Elwyn Reach.

## 2011-12-23 NOTE — Patient Instructions (Addendum)
Cut down to 4 or less cigarettes a day before next visit with Dr. Elwyn Reach.  Change Lantus to 30 units in the morning and 30 units at night.  Next visit on October 10th at 9:45 am.

## 2011-12-23 NOTE — Assessment & Plan Note (Signed)
\  Hypertension: Pt with chronic hypertension acutely elevated today in clinic with multiple readings. She has been not taken her metoprolol today due to being out of refills.  Pt reports amlodipine recently discontinued due to edema in her legs. She is currently only taking lisinopril. She agrees to restarting metoprolol when refills given.  Reassess BP at next visit for additional therapy.

## 2011-12-23 NOTE — Assessment & Plan Note (Signed)
Diabetes currently under poor control of blood glucose based on Lab Results  Component Value Date   HGBA1C 10.3 11/04/2011     ,home fasting CBG readings of 250-260. Control is suboptimal due to suboptimal insulin regimen and dietary indiscretion and sedentary lifestyle. Denies hypoglycemic events.  Able to verbalize appropriate hypoglycemia management plan. Increased dose of basal insulin Lantus (insulin glargine) to 30 units qAM and 30 units qPM. Recommended increasing blood glucose testing to twice daily. Patient will continue to titrate 1 unit/day if fasting CBGs > 100mg /dl until fasting CBGs reach goal or next visit.  Written patient instructions provided.  Follow up in  Pharmacist Clinic Visit October 10th.   Total time in face to face counseling 30 minutes.  Patient seen with Doris Cheadle, PharmD, Pharmacy Resident.

## 2011-12-23 NOTE — Progress Notes (Signed)
  Subjective:    Patient ID: [name] [name], female    DOB: 11-27-1950, 61 y.o.   MRN: [id number]  HPI Patient arrives in good spirits. Patient reports her blood sugar was just checked and it was 258.   She states that all of her readings are ~250.  She denies hypoglycemia.   Patient reports smoking < or = 4 cigs per day.  She continues to use nicotine gum.   Patient expressed concern of running out of insulin in the next month as she has only 3 pens left for the remainder of the month.   She is willing to increase her dose if she has enough insulin.    Review of Systems     Objective:   Physical Exam Medication Samples have been provided to the patient.  Drug name: Lantus Solostar  Qty: 2  LOT: [id number]  Exp.Date: 06/2014  The patient has been instructed regarding the correct time, dose, and frequency of taking this medication, including desired effects and most common side effects.   Kathrin Ruddy 2:57 PM 12/23/2011         Assessment & Plan:   Diabetes currently under poor control of blood glucose based on Lab Results  Component Value Date   HGBA1C 10.3 11/04/2011     ,home fasting CBG readings of 250-260. Control is suboptimal due to suboptimal insulin regimen and dietary indiscretion and sedentary lifestyle. Denies hypoglycemic events.  Able to verbalize appropriate hypoglycemia management plan. Increased dose of basal insulin Lantus (insulin glargine) to 30 units qAM and 30 units qPM. Recommended increasing blood glucose testing to twice daily. Patient will continue to titrate 1 unit/day if fasting CBGs > 100mg /dl until fasting CBGs reach goal or next visit.  Written patient instructions provided.  Follow up in  Pharmacist Clinic Visit October 10th.   Total time in face to face counseling 30 minutes.  Patient seen with Doris Cheadle, PharmD, Pharmacy Resident. .  moderate Nicotine Dependence of 30 years duration in a patient who is poor candidate for success b/c of lack of  previous success and current level of motivation.     She currently has nicotine gum, which she reports using sometimes. Patient counseled on purpose, proper use, and potential adverse effects, including   Written information provided. Provided information on 1 800-QUIT NOW support program.  F/U Rx Clinic Visit in early October.  She set the goal to have no more than 4 cigarettes/day between now and next visit with Dr. Elwyn Reach.  Hypertension: Pt with chronic hypertension acutely elevated today in clinic with multiple readings. She has been not taken her metoprolol today due to being out of refills.  Pt reports amlodipine recently discontinued due to edema in her legs. She is currently only taking lisinopril. She agrees to restarting metoprolol when refills given.  Reassess BP at next visit for additional therapy.

## 2011-12-28 ENCOUNTER — Emergency Department (HOSPITAL_COMMUNITY): Payer: Medicare Other

## 2011-12-28 ENCOUNTER — Encounter (HOSPITAL_COMMUNITY): Payer: Self-pay | Admitting: Emergency Medicine

## 2011-12-28 ENCOUNTER — Observation Stay (HOSPITAL_COMMUNITY)
Admission: EM | Admit: 2011-12-28 | Discharge: 2011-12-31 | Disposition: A | Discharge status: Home / Self Care | Payer: Medicare Other | Attending: Family Medicine | Admitting: Family Medicine

## 2011-12-28 DIAGNOSIS — R079 Chest pain, unspecified: Secondary | ICD-10-CM | POA: Insufficient documentation

## 2011-12-28 DIAGNOSIS — I10 Essential (primary) hypertension: Secondary | ICD-10-CM | POA: Diagnosis present

## 2011-12-28 DIAGNOSIS — F32A Depression, unspecified: Secondary | ICD-10-CM | POA: Diagnosis present

## 2011-12-28 DIAGNOSIS — E1165 Type 2 diabetes mellitus with hyperglycemia: Secondary | ICD-10-CM | POA: Diagnosis present

## 2011-12-28 DIAGNOSIS — I2510 Atherosclerotic heart disease of native coronary artery without angina pectoris: Secondary | ICD-10-CM | POA: Diagnosis present

## 2011-12-28 DIAGNOSIS — G894 Chronic pain syndrome: Principal | ICD-10-CM

## 2011-12-28 DIAGNOSIS — Z794 Long term (current) use of insulin: Secondary | ICD-10-CM | POA: Insufficient documentation

## 2011-12-28 DIAGNOSIS — K3184 Gastroparesis: Secondary | ICD-10-CM | POA: Diagnosis present

## 2011-12-28 DIAGNOSIS — F323 Major depressive disorder, single episode, severe with psychotic features: Secondary | ICD-10-CM | POA: Diagnosis present

## 2011-12-28 DIAGNOSIS — IMO0001 Reserved for inherently not codable concepts without codable children: Secondary | ICD-10-CM | POA: Insufficient documentation

## 2011-12-28 DIAGNOSIS — D126 Benign neoplasm of colon, unspecified: Secondary | ICD-10-CM

## 2011-12-28 DIAGNOSIS — K5900 Constipation, unspecified: Secondary | ICD-10-CM | POA: Diagnosis present

## 2011-12-28 DIAGNOSIS — E1143 Type 2 diabetes mellitus with diabetic autonomic (poly)neuropathy: Secondary | ICD-10-CM | POA: Diagnosis present

## 2011-12-28 DIAGNOSIS — E119 Type 2 diabetes mellitus without complications: Secondary | ICD-10-CM | POA: Insufficient documentation

## 2011-12-28 DIAGNOSIS — E669 Obesity, unspecified: Secondary | ICD-10-CM | POA: Insufficient documentation

## 2011-12-28 DIAGNOSIS — R1013 Epigastric pain: Secondary | ICD-10-CM

## 2011-12-28 DIAGNOSIS — K219 Gastro-esophageal reflux disease without esophagitis: Secondary | ICD-10-CM | POA: Diagnosis present

## 2011-12-28 DIAGNOSIS — Z716 Tobacco abuse counseling: Secondary | ICD-10-CM | POA: Diagnosis present

## 2011-12-28 DIAGNOSIS — E785 Hyperlipidemia, unspecified: Secondary | ICD-10-CM

## 2011-12-28 DIAGNOSIS — Z23 Encounter for immunization: Secondary | ICD-10-CM | POA: Insufficient documentation

## 2011-12-28 HISTORY — DX: Interstitial cystitis (chronic) without hematuria: N30.10

## 2011-12-28 LAB — BASIC METABOLIC PANEL
BUN: 5 mg/dL — ABNORMAL LOW (ref 6–23)
CO2: 27 mEq/L (ref 19–32)
Calcium: 8.8 mg/dL (ref 8.4–10.5)
Chloride: 99 mEq/L (ref 96–112)
Creatinine, Ser: 0.68 mg/dL (ref 0.50–1.10)
GFR calc Af Amer: >90 mL/min (ref >90)
GFR calc non Af Amer: >90 mL/min (ref >90)
Glucose, Bld: 259 mg/dL — ABNORMAL HIGH (ref 70–99)
Potassium: 3.4 mEq/L — ABNORMAL LOW (ref 3.5–5.1)
Sodium: 134 mEq/L — ABNORMAL LOW (ref 135–145)

## 2011-12-28 LAB — CBC
HCT: 37.4 % (ref 36.0–46.0)
Hemoglobin: 12.5 g/dL (ref 12.0–15.0)
MCH: 28.8 pg (ref 26.0–34.0)
MCHC: 33.4 g/dL (ref 30.0–36.0)
MCV: 86.2 fL (ref 78.0–100.0)
Platelets: 143 10*3/uL — ABNORMAL LOW (ref 150–400)
RBC: 4.34 MIL/uL (ref 3.87–5.11)
RDW: 13.8 % (ref 11.5–15.5)
WBC: 6.9 10*3/uL (ref 4.0–10.5)

## 2011-12-28 LAB — POCT I-STAT TROPONIN I: Troponin i, poc: 0 ng/mL (ref 0.00–0.08)

## 2011-12-28 MED ORDER — MORPHINE SULFATE 4 MG/ML IJ SOLN
4.0000 mg | Freq: Once | INTRAMUSCULAR | Status: AC
  Administered 2011-12-29: 4 mg via INTRAVENOUS
  Filled 2011-12-28: qty 1

## 2011-12-28 MED ORDER — ONDANSETRON HCL 4 MG/2ML IJ SOLN
4.0000 mg | Freq: Once | INTRAMUSCULAR | Status: AC
  Administered 2011-12-29: 4 mg via INTRAVENOUS
  Filled 2011-12-28: qty 2

## 2011-12-28 MED ORDER — NITROGLYCERIN 0.4 MG SL SUBL
0.4000 mg | SUBLINGUAL_TABLET | SUBLINGUAL | Status: DC | PRN
  Administered 2011-12-29: 0.4 mg via SUBLINGUAL
  Filled 2011-12-28: qty 25

## 2011-12-28 MED ORDER — POTASSIUM CHLORIDE CRYS ER 20 MEQ PO TBCR
20.0000 meq | EXTENDED_RELEASE_TABLET | Freq: Once | ORAL | Status: AC
  Administered 2011-12-29: 20 meq via ORAL
  Filled 2011-12-28: qty 1
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
## 2011-12-28 NOTE — ED Notes (Addendum)
C/o upper abd pain that radiates to chest, episodes of shaking all over, lips twitching, and nausea x 2 weeks.  Reports vomited x 2 today and diarrhea x 5 days. Pt states she was recently seen by Cardiologist and PCP for same symptoms.

## 2011-12-29 ENCOUNTER — Encounter (HOSPITAL_COMMUNITY): Payer: Self-pay | Admitting: Emergency Medicine

## 2011-12-29 DIAGNOSIS — R1013 Epigastric pain: Secondary | ICD-10-CM | POA: Diagnosis present

## 2011-12-29 DIAGNOSIS — K3184 Gastroparesis: Secondary | ICD-10-CM

## 2011-12-29 DIAGNOSIS — E118 Type 2 diabetes mellitus with unspecified complications: Secondary | ICD-10-CM

## 2011-12-29 DIAGNOSIS — R109 Unspecified abdominal pain: Secondary | ICD-10-CM

## 2011-12-29 DIAGNOSIS — G894 Chronic pain syndrome: Secondary | ICD-10-CM

## 2011-12-29 DIAGNOSIS — E785 Hyperlipidemia, unspecified: Secondary | ICD-10-CM

## 2011-12-29 LAB — BASIC METABOLIC PANEL
BUN: 6 mg/dL (ref 6–23)
CO2: 24 mEq/L (ref 19–32)
Calcium: 8.5 mg/dL (ref 8.4–10.5)
Chloride: 100 mEq/L (ref 96–112)
Creatinine, Ser: 0.69 mg/dL (ref 0.50–1.10)
GFR calc Af Amer: >90 mL/min (ref >90)
GFR calc non Af Amer: >90 mL/min (ref >90)
Glucose, Bld: 206 mg/dL — ABNORMAL HIGH (ref 70–99)
Potassium: 3.8 mEq/L (ref 3.5–5.1)
Sodium: 134 mEq/L — ABNORMAL LOW (ref 135–145)

## 2011-12-29 LAB — HEPATIC FUNCTION PANEL
ALT: 15 U/L (ref 0–35)
AST: 21 U/L (ref 0–37)
Albumin: 3.5 g/dL (ref 3.5–5.2)
Alkaline Phosphatase: 90 U/L (ref 39–117)
Bilirubin, Direct: <0.1 mg/dL (ref 0.0–0.3)
Total Bilirubin: 0.2 mg/dL — ABNORMAL LOW (ref 0.3–1.2)
Total Protein: 7.1 g/dL (ref 6.0–8.3)

## 2011-12-29 LAB — URINE MICROSCOPIC-ADD ON

## 2011-12-29 LAB — URINALYSIS, ROUTINE W REFLEX MICROSCOPIC
Bilirubin Urine: NEGATIVE
Glucose, UA: 100 mg/dL — AB
Hgb urine dipstick: NEGATIVE
Ketones, ur: NEGATIVE mg/dL
Nitrite: NEGATIVE
Protein, ur: NEGATIVE mg/dL
Specific Gravity, Urine: 1.013 (ref 1.005–1.030)
Urobilinogen, UA: 0.2 mg/dL (ref 0.0–1.0)
pH: 6 (ref 5.0–8.0)

## 2011-12-29 LAB — TROPONIN I
Troponin I: <0.3 ng/mL (ref <0.30)
Troponin I: <0.3 ng/mL (ref <0.30)

## 2011-12-29 LAB — PRO B NATRIURETIC PEPTIDE: Pro B Natriuretic peptide (BNP): 102.5 pg/mL (ref 0–125)

## 2011-12-29 LAB — LIPASE, BLOOD: Lipase: 56 U/L (ref 11–59)

## 2011-12-29 LAB — GLUCOSE, CAPILLARY
Glucose-Capillary: 237 mg/dL — ABNORMAL HIGH (ref 70–99)
Glucose-Capillary: 208 mg/dL — ABNORMAL HIGH (ref 70–99)
Glucose-Capillary: 177 mg/dL — ABNORMAL HIGH (ref 70–99)
Glucose-Capillary: 138 mg/dL — ABNORMAL HIGH (ref 70–99)

## 2011-12-29 LAB — MAGNESIUM: Magnesium: 2 mg/dL (ref 1.5–2.5)

## 2011-12-29 LAB — PHOSPHORUS: Phosphorus: 4.3 mg/dL (ref 2.3–4.6)

## 2011-12-29 LAB — OCCULT BLOOD, POC DEVICE: Fecal Occult Bld: NEGATIVE

## 2011-12-29 MED ORDER — ONDANSETRON HCL 4 MG PO TABS
4.0000 mg | ORAL_TABLET | Freq: Four times a day (QID) | ORAL | Status: DC | PRN
Start: 2011-12-29 — End: 2011-12-30

## 2011-12-29 MED ORDER — PNEUMOCOCCAL VAC POLYVALENT 25 MCG/0.5ML IJ INJ
0.5000 mL | INJECTION | INTRAMUSCULAR | Status: AC
  Administered 2011-12-30: 0.5 mL via INTRAMUSCULAR
  Filled 2011-12-29: qty 0.5

## 2011-12-29 MED ORDER — MORPHINE SULFATE 2 MG/ML IJ SOLN
1.0000 mg | INTRAMUSCULAR | Status: DC | PRN
  Administered 2011-12-29 – 2011-12-30 (×6): 1 mg via INTRAVENOUS
  Filled 2011-12-29 (×7): qty 1

## 2011-12-29 MED ORDER — PEG-KCL-NACL-NASULF-NA ASC-C 100 G PO SOLR
0.5000 | Freq: Once | ORAL | Status: AC
  Administered 2011-12-30: 50 g via ORAL

## 2011-12-29 MED ORDER — PREDNISOLONE ACETATE 1 % OP SUSP
1.0000 [drp] | Freq: Two times a day (BID) | OPHTHALMIC | Status: DC
  Administered 2011-12-29 – 2011-12-31 (×5): 1 [drp] via OPHTHALMIC
  Filled 2011-12-29: qty 1

## 2011-12-29 MED ORDER — QUETIAPINE FUMARATE 100 MG PO TABS
100.0000 mg | ORAL_TABLET | Freq: Every day | ORAL | Status: DC
  Administered 2011-12-29 – 2011-12-30 (×2): 100 mg via ORAL
  Filled 2011-12-29 (×4): qty 1

## 2011-12-29 MED ORDER — ASPIRIN EC 81 MG PO TBEC
81.0000 mg | DELAYED_RELEASE_TABLET | Freq: Every day | ORAL | Status: DC
  Administered 2011-12-29 – 2011-12-31 (×3): 81 mg via ORAL
  Filled 2011-12-29 (×3): qty 1

## 2011-12-29 MED ORDER — SODIUM CHLORIDE 0.9 % IJ SOLN: 3.0000 mL | Freq: Two times a day (BID) | INTRAMUSCULAR | Status: DC

## 2011-12-29 MED ORDER — INSULIN GLARGINE 100 UNIT/ML ~~LOC~~ SOLN
10.0000 [IU] | Freq: Every day | SUBCUTANEOUS | Status: DC
  Administered 2011-12-29 – 2011-12-30 (×2): 10 [IU] via SUBCUTANEOUS

## 2011-12-29 MED ORDER — SODIUM CHLORIDE 0.9 % IV SOLN
INTRAVENOUS | Status: DC
  Administered 2011-12-29: 13:00:00 via INTRAVENOUS
  Administered 2011-12-29: 75 mL/h via INTRAVENOUS
  Administered 2011-12-30: 15:00:00 via INTRAVENOUS

## 2011-12-29 MED ORDER — KETOROLAC TROMETHAMINE 0.5 % OP SOLN
1.0000 [drp] | Freq: Four times a day (QID) | OPHTHALMIC | Status: DC
  Administered 2011-12-29 – 2011-12-31 (×4): 1 [drp] via OPHTHALMIC
  Filled 2011-12-29: qty 3

## 2011-12-29 MED ORDER — PEG-KCL-NACL-NASULF-NA ASC-C 100 G PO SOLR
0.5000 | Freq: Once | ORAL | Status: AC
  Administered 2011-12-30: 50 g via ORAL
  Filled 2011-12-29 (×3): qty 1

## 2011-12-29 MED ORDER — PANTOPRAZOLE SODIUM 40 MG PO TBEC
40.0000 mg | DELAYED_RELEASE_TABLET | Freq: Every day | ORAL | Status: DC
  Administered 2011-12-29 – 2011-12-30 (×2): 40 mg via ORAL
  Filled 2011-12-29 (×2): qty 1

## 2011-12-29 MED ORDER — METOPROLOL TARTRATE 50 MG PO TABS
150.0000 mg | ORAL_TABLET | Freq: Two times a day (BID) | ORAL | Status: DC
  Filled 2011-12-29 (×2): qty 1

## 2011-12-29 MED ORDER — ONDANSETRON HCL 4 MG/2ML IJ SOLN: 4.0000 mg | Freq: Four times a day (QID) | INTRAMUSCULAR | Status: DC | PRN

## 2011-12-29 MED ORDER — METOCLOPRAMIDE HCL 10 MG PO TABS
10.0000 mg | ORAL_TABLET | Freq: Three times a day (TID) | ORAL | Status: DC
  Administered 2011-12-29 – 2011-12-31 (×7): 10 mg via ORAL
  Filled 2011-12-29 (×10): qty 1

## 2011-12-29 MED ORDER — POLYETHYLENE GLYCOL 3350 17 G PO PACK
17.0000 g | PACK | Freq: Two times a day (BID) | ORAL | Status: AC
  Administered 2011-12-30 (×2): 17 g via ORAL
  Filled 2011-12-29 (×2): qty 1

## 2011-12-29 MED ORDER — INSULIN ASPART 100 UNIT/ML ~~LOC~~ SOLN: 0.0000 [IU] | Freq: Every day | SUBCUTANEOUS | Status: DC

## 2011-12-29 MED ORDER — LISINOPRIL 40 MG PO TABS
40.0000 mg | ORAL_TABLET | Freq: Every day | ORAL | Status: DC
  Administered 2011-12-29 – 2011-12-31 (×3): 40 mg via ORAL
  Filled 2011-12-29 (×3): qty 1

## 2011-12-29 MED ORDER — SENNA 8.6 MG PO TABS
1.0000 | ORAL_TABLET | Freq: Two times a day (BID) | ORAL | Status: DC
  Administered 2011-12-30: 8.6 mg via ORAL
  Filled 2011-12-29 (×6): qty 1

## 2011-12-29 MED ORDER — ENOXAPARIN SODIUM 40 MG/0.4ML ~~LOC~~ SOLN
40.0000 mg | SUBCUTANEOUS | Status: DC
  Administered 2011-12-29: 40 mg via SUBCUTANEOUS
  Filled 2011-12-29: qty 0.4

## 2011-12-29 MED ORDER — DOCUSATE SODIUM 100 MG PO CAPS
100.0000 mg | ORAL_CAPSULE | Freq: Two times a day (BID) | ORAL | Status: DC
  Administered 2011-12-30: 100 mg via ORAL
  Filled 2011-12-29 (×6): qty 1

## 2011-12-29 MED ORDER — GI COCKTAIL ~~LOC~~
30.0000 mL | Freq: Once | ORAL | Status: AC
  Administered 2011-12-29: 30 mL via ORAL
  Filled 2011-12-29: qty 30

## 2011-12-29 MED ORDER — SIMVASTATIN 20 MG PO TABS
20.0000 mg | ORAL_TABLET | Freq: Every day | ORAL | Status: DC
  Administered 2011-12-29 – 2011-12-30 (×2): 20 mg via ORAL
  Filled 2011-12-29 (×3): qty 1

## 2011-12-29 MED ORDER — INSULIN ASPART 100 UNIT/ML ~~LOC~~ SOLN
0.0000 [IU] | Freq: Three times a day (TID) | SUBCUTANEOUS | Status: DC
  Administered 2011-12-29: 5 [IU] via SUBCUTANEOUS
  Administered 2011-12-29: 3 [IU] via SUBCUTANEOUS
  Administered 2011-12-29: 2 [IU] via SUBCUTANEOUS
  Administered 2011-12-30 (×2): 3 [IU] via SUBCUTANEOUS
  Administered 2011-12-30: 2 [IU] via SUBCUTANEOUS
  Administered 2011-12-31: 3 [IU] via SUBCUTANEOUS

## 2011-12-29 MED ORDER — METOPROLOL TARTRATE 100 MG PO TABS
150.0000 mg | ORAL_TABLET | Freq: Two times a day (BID) | ORAL | Status: DC
  Administered 2011-12-29 – 2011-12-31 (×5): 150 mg via ORAL
  Filled 2011-12-29 (×6): qty 1

## 2011-12-29 NOTE — Progress Notes (Signed)
PCP Note Ms. Lafontant is a 60 year old woman with multiple medical problems including uncontrolled DM, HTN, chronic pain, GERD, diabetic gastroparesis on multiple medications.  I saw her in clinic on 8/28 at which time she was complaining of a small amount of rectal bleeding with out clots.  Exam showed some external hemorrhoids and internal exam was limited by pain.  A steroid cream was prescribed and GI referral placed.  Appreciate GI's input during this admission.  Additionally, her diabetes has been difficult to control.  Dr. Raymondo Band and I have been working on titrating up her Lantus dose to get fasting CBG <150.  Her most recent dose was 30 units BID.   She is on multiple medications.  Any assistance in paring down her medications as appropriate would be appreciated.   Discussed with patient that the goal of this admission will be more to rule out bad things.  She will likely still have some pain at discharge, and this is something we will continue to work on in the outpatient setting.  I appreciate the excellent care provided by the inpatient team.  Please contact me with additional questions or concerns as needed.  BOOTH, Rockey Guarino 12/29/2011, 4:06 PM

## 2011-12-29 NOTE — Care Management Note (Signed)
    Page 1 of 2   12/29/2011     3:34:43 PM   CARE MANAGEMENT NOTE 12/29/2011  Patient:  [name] [name], [name] [name]   Account Number:  [id number]  Date Initiated:  12/29/2011  Documentation initiated by:  GRAVES-BIGELOW,Durand Wittmeyer  Subjective/Objective Assessment:   Pt admitted for epigastric pain. Pt's son and grandkids live with her. Pt states she is having difficulty with paying for medications. Pt states she gets meds via [employer].     Action/Plan:   CM relayed to pt that she may need to change pharmacy to [facility] due to cost effectiveness- or to look at a pharmacy that delivers like [facility]. Pt will benefit from [facility] for medication management.   Anticipated DC Date:  12/30/2011   Anticipated DC Plan:  HOME W HOME HEALTH SERVICES      DC Planning Services  CM consult      [facility] Choice  HOME HEALTH   Choice offered to / List presented to:  C-1 Patient        HH arranged  HH-1 RN  HH-10 DISEASE MANAGEMENT      HH agency  [facility]   Status of service:  Completed, signed off Medicare Important Message given?   (If response is "NO", the following Medicare IM given date fields will be blank) Date Medicare IM given:   Date Additional Medicare IM given:    Discharge Disposition:  HOME W HOME HEALTH SERVICES  Per UR Regulation:  Reviewed for med. necessity/level of care/duration of stay  If discussed at Long Length of Stay Meetings, dates discussed:    Comments:   12-29-11 [address], [place] [phone] CM made referral for [facility] services. SOC to begin within 24-48 hours post d/c.  12-29-11 [address], [place] [phone] CM will ocntinue to monitor for disposition needs.

## 2011-12-29 NOTE — Progress Notes (Signed)
Inpatient Diabetes Program Recommendations  AACE/ADA: New Consensus Statement on Inpatient Glycemic Control (2013)  Target Ranges:  Prepandial:   less than 140 mg/dL      Peak postprandial:   less than 180 mg/dL (1-2 hours)      Critically ill patients:  140 - 180 mg/dL   Reason for Visit: Results for [place], POZZI (MRN [id number]) as of 12/29/2011 13:33  Ref. Range 12/29/2011 03:57 12/29/2011 07:39 12/29/2011 11:24  Glucose-Capillary Latest Range: 70-99 mg/dL 409 (H) 811 (H) 914 (H)   Note Lantus 10 units to start tonight.  Patients home dose of Lantus was 60 units daily.  May consider increasing Lantus to 1/2 of home dose if CBG's remain greater than 200 mg/dL.  Will follow.
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
## 2011-12-29 NOTE — ED Provider Notes (Signed)
Medical screening examination/treatment/procedure(s) were conducted as a shared visit with non-physician practitioner(s) and myself.  I personally evaluated the patient during the encounter  Sunnie Nielsen, MD 12/29/11 [phone]

## 2011-12-29 NOTE — H&P (Signed)
Seen and examined.  Chart Reviewed.  Discussed with Dr. Armen Pickup.  Agree with her management and plan.  Briefly, [name] [name] is a 61 yo female with a chronic abd pain syndrome.  She had an on the job injury three years ago with abd trauma and hernia surgery 7 months ago.  While the exact nature of her pain is unclear, it does seem clear to me that she has had three years of more or less similar pain and, whatever the initial cause, she now has a chronic pain syndrome.  I greatly appreciate GI help and fully agree with their planned diagnostic WU.  I will be very surprised if we find a single, causative pathologic explanation for her pain.  My hope is that we can exclude some serious problems and perhaps find some interventions that help reduce her pain.  I had a long talk with her and made it clear that it was unlikely that we would find a "cure" for her pain.

## 2011-12-29 NOTE — Progress Notes (Signed)
Patient reported blood in stool this morning and continued abdominal pain.  Information given to patient's doctor.  GI consulted.  Colman Cater

## 2011-12-29 NOTE — Consult Note (Signed)
Chart was reviewed and patient was examined. X-rays were reviewed.    Pt's abdominal pain may be due to a myriad of causes including NSAID use, ulcer or nonulcer dyspepsia, gastroparesis, functional overlay.  Doubt mesenteric ischemia, biliary tract disease.   Rectal bleeding probably hemorrhoidal but a more proximal colonic bleeding source should be ruled out.  Plan #1 colo/EGD - to be done with propofol, at the same time #2 GES on reglan if #1 is negative #3 may consider other meds for chronic pain including elavil, cymbalta, lyrica, if w/u is negative.  Pt has not improved with neurontin.  Barbette Hair. Arlyce Dice, M.D., [facility] Cell [phone]
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
## 2011-12-29 NOTE — Progress Notes (Signed)
Clinical Social Work Department BRIEF PSYCHOSOCIAL ASSESSMENT 12/29/2011  Patient:  [name] [name], [name] [name]     Account Number:  [id number]     Admit date:  12/28/2011  Clinical Social Worker:  Lourdes Sledge  Date/Time:  12/29/2011 03:21 PM  Referred by:  Physician  Date Referred:  12/29/2011 Referred for  Advanced Directives   Other Referral:   Interview type:  Patient Other interview type:    PSYCHOSOCIAL DATA Living Status:  FAMILY Admitted from facility:   Level of care:   Primary support name:  Jeannine Kitten [phone] Primary support relationship to patient:  SIBLING Degree of support available:   Pt sisters were present in pt room and active in assessment. Pt appears to have significant support.    CURRENT CONCERNS Current Concerns  Other - See comment   Other Concerns:   Advanced directives    SOCIAL WORK ASSESSMENT / PLAN CSW received a referral for an advanced directives packet.    CSW visited pt room, introduced herself and role. CSW provided pt with an advanced directives packet and answered any questions pt or her family had. Pt stated she would take packet home with her and have it notarized. Pt has no further questions or concerns. CSW signing off.   Assessment/plan status:  No Further Intervention Required Other assessment/ plan:   Information/referral to community resources:   CSW pt with an advanced directives packet and informed her of where to go to have it notarized.    PATIENT'S/FAMILY'S RESPONSE TO PLAN OF CARE: Pt sitting up in bed alert and oriented. Pt 2 sisters and pastor present in the room. Pt had no concerns or questions. CSW signing off.        Theresia Bough, MSW, Theresia Majors [phone]

## 2011-12-30 DIAGNOSIS — R1013 Epigastric pain: Secondary | ICD-10-CM

## 2011-12-30 LAB — BASIC METABOLIC PANEL
BUN: 5 mg/dL — ABNORMAL LOW (ref 6–23)
CO2: 25 mEq/L (ref 19–32)
Calcium: 8.5 mg/dL (ref 8.4–10.5)
Chloride: 108 mEq/L (ref 96–112)
Creatinine, Ser: 0.69 mg/dL (ref 0.50–1.10)
GFR calc Af Amer: >90 mL/min (ref >90)
GFR calc non Af Amer: >90 mL/min (ref >90)
Glucose, Bld: 146 mg/dL — ABNORMAL HIGH (ref 70–99)
Potassium: 3.7 mEq/L (ref 3.5–5.1)
Sodium: 143 mEq/L (ref 135–145)

## 2011-12-30 LAB — CBC
HCT: 35.2 % — ABNORMAL LOW (ref 36.0–46.0)
Hemoglobin: 11.2 g/dL — ABNORMAL LOW (ref 12.0–15.0)
MCH: 27.8 pg (ref 26.0–34.0)
MCHC: 31.8 g/dL (ref 30.0–36.0)
MCV: 87.3 fL (ref 78.0–100.0)
Platelets: 147 10*3/uL — ABNORMAL LOW (ref 150–400)
RBC: 4.03 MIL/uL (ref 3.87–5.11)
RDW: 13.9 % (ref 11.5–15.5)
WBC: 4.7 10*3/uL (ref 4.0–10.5)

## 2011-12-30 LAB — GLUCOSE, CAPILLARY
Glucose-Capillary: 164 mg/dL — ABNORMAL HIGH (ref 70–99)
Glucose-Capillary: 155 mg/dL — ABNORMAL HIGH (ref 70–99)
Glucose-Capillary: 148 mg/dL — ABNORMAL HIGH (ref 70–99)
Glucose-Capillary: 126 mg/dL — ABNORMAL HIGH (ref 70–99)

## 2011-12-30 LAB — OCCULT BLOOD X 1 CARD TO LAB, STOOL
Fecal Occult Bld: NEGATIVE
Fecal Occult Bld: NEGATIVE

## 2011-12-30 LAB — CLOSTRIDIUM DIFFICILE BY PCR: Toxigenic C. Difficile by PCR: NEGATIVE

## 2011-12-30 MED ORDER — HYDRALAZINE HCL 10 MG PO TABS
10.0000 mg | ORAL_TABLET | Freq: Four times a day (QID) | ORAL | Status: DC | PRN
  Filled 2011-12-30: qty 1

## 2011-12-30 MED ORDER — MORPHINE SULFATE 2 MG/ML IJ SOLN
1.0000 mg | INTRAMUSCULAR | Status: DC | PRN
  Administered 2011-12-30 – 2011-12-31 (×4): 1 mg via INTRAVENOUS
  Filled 2011-12-30 (×3): qty 1

## 2011-12-30 MED ORDER — HYDRALAZINE HCL 10 MG PO TABS
5.0000 mg | ORAL_TABLET | Freq: Four times a day (QID) | ORAL | Status: DC | PRN
  Administered 2011-12-30: 5 mg via ORAL
  Filled 2011-12-30: qty 1

## 2011-12-30 MED ORDER — SODIUM CHLORIDE 0.9 % IV SOLN: INTRAVENOUS | Status: DC

## 2011-12-30 MED ORDER — NORTRIPTYLINE HCL 25 MG PO CAPS
25.0000 mg | ORAL_CAPSULE | Freq: Every day | ORAL | Status: DC
  Administered 2011-12-30: 25 mg via ORAL
  Filled 2011-12-30 (×2): qty 1

## 2011-12-30 NOTE — Progress Notes (Signed)
Patient ID: [name] [name], female   DOB: March 30, 1951, 61 y.o.   MRN: [id number] Family Medicine Teaching Service Daily Progress Note Service Page: [phone]  Patient Assessment: 61 yo female presenting with 2 weeks history of worsening chronic epigastric pain and RUQ pain  Subjective: Patient states had a rough night.  Continues to complain of abdominal pain.  States only able to sleep for 2 hours at a time.  States can't go through this again. Also, told nurse that she wants to go home because she can just lay there in pain at home, though was looking forward to GI workup which is tomorrow.  Objective: Temp:  [98.4 F (36.9 C)-99.7 F (37.6 C)] 98.7 F (37.1 C) (09/10 0500) Pulse Rate:  [70-77] 73  (09/10 0500) Resp:  [18-20] 18  (09/10 0500) BP: (140-180)/(48-82) 140/48 mmHg (09/10 0500) SpO2:  [93 %-98 %] 98 % (09/10 0500) Exam: General: NAD, resting comfortably in bed Cardiovascular: rrr, no murmurs, rubs, or gallops Respiratory: CTAB, no wheezes Abdomen: soft, diffusely tender to light palpation, but no outward signs of pain on palpation, no rebound or guarding Extremities: no edema  I have reviewed the patient's medications, labs, imaging, and diagnostic testing.  Notable results are summarized below.  CBC BMET   Lab 12/30/11 0459 12/28/11 2136  WBC 4.7 6.9  HGB 11.2* 12.5  HCT 35.2* 37.4  PLT 147* 143*    Lab 12/30/11 0459 12/29/11 0640 12/28/11 2136  NA 143 134* 134*  K 3.7 3.8 3.4*  CL 108 100 99  CO2 25 24 27   BUN 5* 6 5*  CREATININE 0.69 0.69 0.68  GLUCOSE 146* 206* 259*  CALCIUM 8.5 8.5 8.8     Results for orders placed during the hospital encounter of 12/28/11 (from the past 24 hour(s))  TROPONIN I     Status: Normal   Collection Time   12/29/11  9:28 AM      Component Value Range   Troponin I <0.30  <0.30 ng/mL  GLUCOSE, CAPILLARY     Status: Abnormal   Collection Time   12/29/11 11:24 AM      Component Value Range   Glucose-Capillary 177 (*) 70 - 99  mg/dL   Comment 1 Notify RN    GLUCOSE, CAPILLARY     Status: Abnormal   Collection Time   12/29/11  4:47 PM      Component Value Range   Glucose-Capillary 138 (*) 70 - 99 mg/dL  CBC     Status: Abnormal   Collection Time   12/30/11  4:59 AM      Component Value Range   WBC 4.7  4.0 - 10.5 K/uL   RBC 4.03  3.87 - 5.11 MIL/uL   Hemoglobin 11.2 (*) 12.0 - 15.0 g/dL   HCT 14.7 (*) 82.9 - 56.2 %   MCV 87.3  78.0 - 100.0 fL   MCH 27.8  26.0 - 34.0 pg   MCHC 31.8  30.0 - 36.0 g/dL   RDW 13.0  86.5 - 78.4 %   Platelets 147 (*) 150 - 400 K/uL  BASIC METABOLIC PANEL     Status: Abnormal   Collection Time   12/30/11  4:59 AM      Component Value Range   Sodium 143  135 - 145 mEq/L   Potassium 3.7  3.5 - 5.1 mEq/L   Chloride 108  96 - 112 mEq/L   CO2 25  19 - 32 mEq/L   Glucose, Bld 146 (*) 70 -  99 mg/dL   BUN 5 (*) 6 - 23 mg/dL   Creatinine, Ser 9.60  0.50 - 1.10 mg/dL   Calcium 8.5  8.4 - 45.4 mg/dL   GFR calc non Af Amer >90  >90 mL/min   GFR calc Af Amer >90  >90 mL/min  GLUCOSE, CAPILLARY     Status: Abnormal   Collection Time   12/30/11  7:51 AM      Component Value Range   Glucose-Capillary 164 (*) 70 - 99 mg/dL   Lipase     Component Value Date/Time   LIPASE 56 12/29/2011 0301     Imaging/Diagnostic Tests: CXR: Atelectasis or fibrosis in the lung bases. No active pulmonary  disease.  Plan: 61 yo F with multiple ACS risk factors presents with complaint of acute worsening of chronic abdominal pain.  1. Chronic abdominal pain  A: it is unclear if her pain is actually worse. The outpatient work-up has thus far revealed a relatively normal cardiac cath. The patient has been referred to GI and has an appointment in October, 2013. There are no signs of obstruction or active GI bleed. Her exam is reassuring. She is s/p cholecystectomy.  P:   -ruled out ACS with cycled negative cardiac enzyme  -PPI  -pain/symptom control with prn zofran and morphine.   -LFTs and lipase within  normal limits.  -clear liquid diet in advance of colonoscopy and EGD tomorrow  -GI saw patient yesterday and have recommended EGD and colonoscopy to be done tomorrow, if that is negative rec gastric emptying study  -will start nortryptiline 25 mg daily for chronic pain syndrome aspect 2. HTN: BPs continue to be elevated, most recently at 172/75. Continue home meds including BB and ACE. Hydralazine 5 mg prn for pressures >170. 3. DM2:  A: last A1c elevated to 10.3. CBGs 138-164. P:  Monitor CBGs  lantus 10 u q D  SSI  4. HDL: continue statin  5. FEN/GI:  A: low K+. repleted in ED.  P: NS @ 75 ml.hr and clear liquid diet. F/u repeat K+.  Code: Full  DVT PPx: Lovenox  Dispo: pending ACS rule out and improved pain control back to home with her family.    Marikay Alar, MD 12/30/2011, 8:57 AM

## 2011-12-30 NOTE — Progress Notes (Signed)
Utilization review completed.  

## 2011-12-30 NOTE — Progress Notes (Signed)
Seen and examined.  Agree with Dr. Birdie Sons.  Chronic pain continues.  Bowel clean out in process for EGD, colonoscopy tomorrow.  I hope those tests provide some insight to her chronic pain.

## 2011-12-30 NOTE — Progress Notes (Signed)
[name] [name] is "irrate" and wants to sign out AMA because her pain medications have been altered. She is requesting someone come up and talk to her. I did so, and explained to her she is able to have her medications and we added a medication on that will start this evening. She then asked some questions about her procedure tomorrow,which I fielded.   After talking she was fine and never mentioned anything to me about AMA. She seemed pleasant while I was in the room and already received her medications prior to me coming up to talk to her.

## 2011-12-30 NOTE — Progress Notes (Signed)
     [facility] Daily Rounding Note 12/30/2011, 11:32 AM  SUBJECTIVE:       Ongoing intense abdominal and all-over pain.  Some nausea, no emesis.  About 3 loose stools yesterday.  MD ordered Cdiff toxin  OBJECTIVE:         Vital signs in last 24 hours:    Temp:  [98.4 F (36.9 C)-99.7 F (37.6 C)] 98.7 F (37.1 C) (09/10 0500) Pulse Rate:  [70-77] 73  (09/10 0500) Resp:  [18-20] 18  (09/10 0500) BP: (140-180)/(48-82) 140/48 mmHg (09/10 0500) SpO2:  [93 %-98 %] 98 % (09/10 0500) Last BM Date: 12/29/11 General: looks the same, not acutely ill.  Laying postrate in bed Heart: RRR Chest: clear.  No SOB Abdomen: insignificant tenderness to touch though she c/o pain Extremities: non-pitting pedal edema, fat on dorsal feet Neuro/Psych:  Cooperative, a bit anxious.   Lab Results:  Basename 12/30/11 0459 12/28/11 2136  WBC 4.7 6.9  HGB 11.2* 12.5  HCT 35.2* 37.4  PLT 147* 143*   BMET  Basename 12/30/11 0459 12/29/11 0640 12/28/11 2136  NA 143 134* 134*  K 3.7 3.8 3.4*  CL 108 100 99  CO2 25 24 27   GLUCOSE 146* 206* 259*  BUN 5* 6 5*  CREATININE 0.69 0.69 0.68  CALCIUM 8.5 8.5 8.8   LFT  Basename 12/29/11 0301  PROT 7.1  ALBUMIN 3.5  AST 21  ALT 15  ALKPHOS 90  BILITOT 0.2*  BILIDIR <0.1  IBILI NOT CALCULATED     ASSESMENT: * Worsening of chronic, long standing diffuse abdominal pain with occasional nausea and emesis  Has broad coverage for her hx of gastritis with PPI and H2 blocker.  On standing doses of Reglan for Gastroparesis.  ? Hx of pancreatitis. No elevation of Lipase or LFTs currently  * Rectal bleeding. Hemorrhoids on 2007 colonoscopy, her last study  * Fatty liver, this may be cause of previous minor elevation of LFTs  * IDDM  * Fibromyalgia, chronic MS pain, Chronic cystitis.    PLAN: *  Colonoscopy and EGD with Propofol sedation in OR tomorrow at 1345.  Prep today. Pt agreeable.    LOS: 2 days   Jennye Moccasin  12/30/2011, 11:32 AM Pager:  [phone]

## 2011-12-30 NOTE — Progress Notes (Signed)
Patient started her Movie Prep bowel prep this afternoon around 1730.  At this point she is halfway through first prep stating she is not sure if she will be able to complete it in the next few hours.  Patient encouraged to drink as much as possible.  Will continue to monitor.  Colman Cater

## 2011-12-30 NOTE — Progress Notes (Signed)
I have personally taken an interval history, reviewed the chart, and examined the patient.  I agree with the extender's note, impression and recommendations.  

## 2011-12-31 ENCOUNTER — Encounter (HOSPITAL_COMMUNITY): Payer: Self-pay

## 2011-12-31 ENCOUNTER — Observation Stay (HOSPITAL_COMMUNITY): Payer: Medicare Other | Admitting: Anesthesiology

## 2011-12-31 ENCOUNTER — Encounter (HOSPITAL_COMMUNITY): Payer: Self-pay | Admitting: Anesthesiology

## 2011-12-31 ENCOUNTER — Encounter (HOSPITAL_COMMUNITY)
Admission: EM | Disposition: A | Discharge status: Home / Self Care | Payer: Self-pay | Source: Home / Self Care | Attending: Emergency Medicine

## 2011-12-31 DIAGNOSIS — D126 Benign neoplasm of colon, unspecified: Secondary | ICD-10-CM

## 2011-12-31 HISTORY — PX: COLONOSCOPY: SHX5424

## 2011-12-31 HISTORY — PX: ESOPHAGOGASTRODUODENOSCOPY: SHX5428

## 2011-12-31 LAB — GLUCOSE, CAPILLARY
Glucose-Capillary: 156 mg/dL — ABNORMAL HIGH (ref 70–99)
Glucose-Capillary: 135 mg/dL — ABNORMAL HIGH (ref 70–99)
Glucose-Capillary: 107 mg/dL — ABNORMAL HIGH (ref 70–99)

## 2011-12-31 LAB — CBC
HCT: 35.9 % — ABNORMAL LOW (ref 36.0–46.0)
Hemoglobin: 11.6 g/dL — ABNORMAL LOW (ref 12.0–15.0)
MCH: 28 pg (ref 26.0–34.0)
MCHC: 32.3 g/dL (ref 30.0–36.0)
MCV: 86.7 fL (ref 78.0–100.0)
Platelets: 157 10*3/uL (ref 150–400)
RBC: 4.14 MIL/uL (ref 3.87–5.11)
RDW: 13.6 % (ref 11.5–15.5)
WBC: 4.8 10*3/uL (ref 4.0–10.5)

## 2011-12-31 LAB — BASIC METABOLIC PANEL
BUN: 4 mg/dL — ABNORMAL LOW (ref 6–23)
CO2: 21 mEq/L (ref 19–32)
Calcium: 8.7 mg/dL (ref 8.4–10.5)
Chloride: 108 mEq/L (ref 96–112)
Creatinine, Ser: 0.7 mg/dL (ref 0.50–1.10)
GFR calc Af Amer: >90 mL/min (ref >90)
GFR calc non Af Amer: >90 mL/min (ref >90)
Glucose, Bld: 143 mg/dL — ABNORMAL HIGH (ref 70–99)
Potassium: 3.6 mEq/L (ref 3.5–5.1)
Sodium: 140 mEq/L (ref 135–145)

## 2011-12-31 SURGERY — COLONOSCOPY
Anesthesia: Monitor Anesthesia Care

## 2011-12-31 MED ORDER — HYDROMORPHONE HCL PF 1 MG/ML IJ SOLN
INTRAMUSCULAR | Status: AC
  Filled 2011-12-31: qty 1

## 2011-12-31 MED ORDER — LACTATED RINGERS IV SOLN
INTRAVENOUS | Status: DC
  Administered 2011-12-31: 12:00:00 via INTRAVENOUS

## 2011-12-31 MED ORDER — FENTANYL CITRATE 0.05 MG/ML IJ SOLN
INTRAMUSCULAR | Status: DC | PRN
  Administered 2011-12-31 (×3): 50 ug via INTRAVENOUS

## 2011-12-31 MED ORDER — ONDANSETRON HCL 4 MG/2ML IJ SOLN: 4.0000 mg | Freq: Once | INTRAMUSCULAR | Status: DC | PRN

## 2011-12-31 MED ORDER — NORTRIPTYLINE HCL 25 MG PO CAPS: 25.0000 mg | ORAL_CAPSULE | Freq: Every day | ORAL | Status: DC

## 2011-12-31 MED ORDER — PROPOFOL INFUSION 10 MG/ML OPTIME
INTRAVENOUS | Status: DC | PRN
  Administered 2011-12-31: 50 ug/kg/min via INTRAVENOUS

## 2011-12-31 MED ORDER — LACTATED RINGERS IV SOLN
INTRAVENOUS | Status: DC | PRN
  Administered 2011-12-31: 13:00:00 via INTRAVENOUS

## 2011-12-31 MED ORDER — PROPOFOL 10 MG/ML IV BOLUS
INTRAVENOUS | Status: DC | PRN
  Administered 2011-12-31 (×4): 50 mg via INTRAVENOUS

## 2011-12-31 MED ORDER — HYDROMORPHONE HCL PF 1 MG/ML IJ SOLN
0.2500 mg | INTRAMUSCULAR | Status: DC | PRN
  Administered 2011-12-31 (×4): 0.5 mg via INTRAVENOUS

## 2011-12-31 NOTE — Progress Notes (Signed)
Seen and examined on 9/10.  She was calm by the time we spoke.

## 2011-12-31 NOTE — Progress Notes (Signed)
Seen and examined.  I am pleased that the EGD and colonoscopy were essentially normal.  I agree this is a chronic pain syndrome.  Will DC and manage as outpatient.

## 2011-12-31 NOTE — Op Note (Addendum)
[facility] [address] [place], [address]   [email or web address] PROCEDURE REPORT  PATIENT: [name] [name], [name] [name]  MR#: [id number] BIRTHDATE: [birth year] , 61  yrs. old GENDER: Female ENDOSCOPIST: Louis Meckel, MD REFERRED BY: PROCEDURE DATE:  12/31/2011 PROCEDURE:   Colonoscopy with snare polypectomy ASA CLASS:   Class II INDICATIONS: MEDICATIONS: MAC sedation, administered by CRNA  DESCRIPTION OF PROCEDURE:   After the risks benefits and alternatives of the procedure were thoroughly explained, informed consent was obtained.  A digital rectal exam revealed no abnormalities of the rectum.   The     endoscope was introduced through the anus and advanced to the cecum, which was identified by the ileocecal valve. No adverse events experienced.   The quality of the prep was Moviprep fair  The instrument was then slowly withdrawn as the colon was fully examined.      COLON FINDINGS: A sessile polyp was found in the ascending colon.  A polypectomy was performed using snare cautery.  The resection was complete and the polyp tissue was completely retrieved.   The colon mucosa was otherwise normal.  Retroflexed views revealed no abnormalities. The time to cecum=  .  Withdrawal time=9 minutes 0 seconds.  The scope was withdrawn and the procedure completed. COMPLICATIONS: There were no complications.  ENDOSCOPIC IMPRESSION: 1.   Sessile polyp was found in the ascending colon; polypectomy was performed using snare cautery 2.   The colon mucosa was otherwise normal  RECOMMENDATIONS: If the polyp(s) removed today are proven to be adenomatous (pre-cancerous) polyps, you will need a repeat colonoscopy in 5 years.  Otherwise you should continue to follow colorectal cancer screening guidelines for "routine risk" patients with colonoscopy in 10 years.  You will receive a letter within 1-2 weeks with the results of your biopsy as well as  final recommendations.  Please call my office if you have not received a letter after 3 weeks.   Activated:  12/31/2011 2:20 PM   cc:

## 2011-12-31 NOTE — Progress Notes (Signed)
EGD is negative. Colo shows a benign appearing polyp - removed.  No findings to explain abdominal pain.  Recommend - referral to a pain clinic.

## 2011-12-31 NOTE — Progress Notes (Signed)
Patient ID: [name] [name], female   DOB: 10-07-1950, 61 y.o.   MRN: [id number] Family Medicine Teaching Service Daily Progress Note Service Page: [phone]  Patient Assessment: 61 yo female presenting with 2 weeks history of worsening chronic epigastric pain and RUQ pain  Subjective: Patient states doing well this morning.  She woke up around 5 am due to being anxious regarding her procedures this morning.  States in some pain this morning, though hasn't required any pain medication since last night.  Objective: Temp:  [98.2 F (36.8 C)-99.2 F (37.3 C)] 98.2 F (36.8 C) (09/11 0625) Pulse Rate:  [70-75] 75  (09/11 0625) Resp:  [17-20] 17  (09/11 0625) BP: (156-184)/(61-83) 156/83 mmHg (09/11 0625) SpO2:  [95 %-100 %] 95 % (09/11 0625) Exam: General: NAD, resting comfortably in bed Cardiovascular: rrr, no murmurs, rubs, or gallops Respiratory: CTAB, no wheezes Abdomen: soft, diffusely tender to light palpation, but no outward signs of pain on palpation, no rebound or guarding Extremities: no edema  I have reviewed the patient's medications, labs, imaging, and diagnostic testing.  Notable results are summarized below.  CBC BMET   Lab 12/31/11 0527 12/30/11 0459 12/28/11 2136  WBC 4.8 4.7 6.9  HGB 11.6* 11.2* 12.5  HCT 35.9* 35.2* 37.4  PLT 157 147* 143*    Lab 12/31/11 0527 12/30/11 0459 12/29/11 0640  NA 140 143 134*  K 3.6 3.7 3.8  CL 108 108 100  CO2 21 25 24   BUN 4* 5* 6  CREATININE 0.70 0.69 0.69  GLUCOSE 143* 146* 206*  CALCIUM 8.7 8.5 8.5     Results for orders placed during the hospital encounter of 12/28/11 (from the past 24 hour(s))  GLUCOSE, CAPILLARY     Status: Abnormal   Collection Time   12/30/11 11:31 AM      Component Value Range   Glucose-Capillary 155 (*) 70 - 99 mg/dL  GLUCOSE, CAPILLARY     Status: Abnormal   Collection Time   12/30/11  4:33 PM      Component Value Range   Glucose-Capillary 148 (*) 70 - 99 mg/dL  CLOSTRIDIUM DIFFICILE BY PCR      Status: Normal   Collection Time   12/30/11  6:56 PM      Component Value Range   C difficile by pcr NEGATIVE  NEGATIVE  OCCULT BLOOD X 1 CARD TO LAB, STOOL     Status: Normal   Collection Time   12/30/11  7:03 PM      Component Value Range   Fecal Occult Bld NEGATIVE    GLUCOSE, CAPILLARY     Status: Abnormal   Collection Time   12/30/11  8:50 PM      Component Value Range   Glucose-Capillary 126 (*) 70 - 99 mg/dL  OCCULT BLOOD X 1 CARD TO LAB, STOOL     Status: Normal   Collection Time   12/30/11  9:22 PM      Component Value Range   Fecal Occult Bld NEGATIVE    CBC     Status: Abnormal   Collection Time   12/31/11  5:27 AM      Component Value Range   WBC 4.8  4.0 - 10.5 K/uL   RBC 4.14  3.87 - 5.11 MIL/uL   Hemoglobin 11.6 (*) 12.0 - 15.0 g/dL   HCT 14.7 (*) 82.9 - 56.2 %   MCV 86.7  78.0 - 100.0 fL   MCH 28.0  26.0 - 34.0 pg  MCHC 32.3  30.0 - 36.0 g/dL   RDW 16.1  09.6 - 04.5 %   Platelets 157  150 - 400 K/uL  BASIC METABOLIC PANEL     Status: Abnormal   Collection Time   12/31/11  5:27 AM      Component Value Range   Sodium 140  135 - 145 mEq/L   Potassium 3.6  3.5 - 5.1 mEq/L   Chloride 108  96 - 112 mEq/L   CO2 21  19 - 32 mEq/L   Glucose, Bld 143 (*) 70 - 99 mg/dL   BUN 4 (*) 6 - 23 mg/dL   Creatinine, Ser 4.09  0.50 - 1.10 mg/dL   Calcium 8.7  8.4 - 81.1 mg/dL   GFR calc non Af Amer >90  >90 mL/min   GFR calc Af Amer >90  >90 mL/min  GLUCOSE, CAPILLARY     Status: Abnormal   Collection Time   12/31/11  7:43 AM      Component Value Range   Glucose-Capillary 156 (*) 70 - 99 mg/dL   Comment 1 Notify RN     Lipase     Component Value Date/Time   LIPASE 56 12/29/2011 0301     Imaging/Diagnostic Tests: CXR: Atelectasis or fibrosis in the lung bases. No active pulmonary  disease.  Plan: 61 yo F with multiple ACS risk factors presents with complaint of acute worsening of chronic abdominal pain.  1. Chronic abdominal pain  A: patient continues to  complain of pain.  GI is involved, though it is likely that this is not GI related.  Likely has a chronic pain syndrome component.  P:   -ruled out ACS with cycled negative cardiac enzyme  -PPI continued -pain/symptom control with prn zofran and morphine.   -LFTs and lipase within normal limits.  -clear liquid diet in advance of colonoscopy and EGD tomorrow  -GI saw patient and have recommended EGD and colonoscopy to be done today, if that is negative rec gastric emptying study  -will start nortryptiline 25 mg daily for chronic pain syndrome aspect 2. HTN: BPs continue to be elevated, most recently at 172/75. Continue home meds including BB and ACE. Hydralazine 5 mg prn for pressures >170 which she received one time overnight.  Could consider increasing home meds as outpatient. 3. DM2:  A: last A1c elevated to 10.3. Fasting glucose 143 this am. P:  Monitor CBGs  lantus 10 u q D  SSI  4. HDL: continue statin  5. FEN/GI:  A: low K+. repleted in ED. Remains on low end of normal, will continue to follow. P: NS @ 75 ml.hr and clear liquid diet. F/u repeat K+.  Code: Full  DVT PPx: Lovenox  Dispo: home later today if doing well following colonoscopy and EGD.    Marikay Alar, MD 12/31/2011, 8:14 AM

## 2011-12-31 NOTE — Anesthesia Preprocedure Evaluation (Addendum)
Anesthesia Evaluation  Patient identified by MRN, date of birth, ID band Patient awake    Reviewed: Allergy & Precautions, H&P , NPO status , Patient's Chart, lab work & pertinent test results, reviewed documented beta blocker date and time   Airway Mallampati: I TM Distance: >3 FB Neck ROM: full    Dental  (+) Edentulous Upper, Dental Advisory Given and Partial Lower   Pulmonary asthma , Current Smoker,          Cardiovascular Exercise Tolerance: Poor hypertension, Pt. on medications + angina with exertion + CAD Rhythm:regular Rate:Normal     Neuro/Psych  Headaches, Anxiety Depression  Neuromuscular disease CVA, No Residual Symptoms    GI/Hepatic GERD-  ,  Endo/Other  diabetes, Poorly Controlled, Type 2, Insulin Dependent  Renal/GU      Musculoskeletal  (+) Fibromyalgia -  Abdominal   Peds  Hematology   Anesthesia Other Findings   Reproductive/Obstetrics                         Anesthesia Physical Anesthesia Plan  ASA: III  Anesthesia Plan: General   Post-op Pain Management:    Induction: Intravenous  Airway Management Planned: Oral ETT  Additional Equipment:   Intra-op Plan:   Post-operative Plan: Extubation in OR  Informed Consent: I have reviewed the patients History and Physical, chart, labs and discussed the procedure including the risks, benefits and alternatives for the proposed anesthesia with the patient or authorized representative who has indicated his/her understanding and acceptance.     Plan Discussed with: CRNA, Anesthesiologist and Surgeon  Anesthesia Plan Comments:         Anesthesia Quick Evaluation

## 2011-12-31 NOTE — Op Note (Addendum)
[facility] [address] [place], [address]   [email or web address] PROCEDURE REPORT  PATIENT: [name] [name]  MR#: [id number] BIRTHDATE: 01/25/1951 , 61  yrs. old GENDER: Female ENDOSCOPIST: Robert D Kaplan, MD REFERRED BY: PROCEDURE DATE:  12/31/2011 PROCEDURE:  EGD, diagnostic ASA CLASS:     Class II INDICATIONS: MEDICATIONS: MAC sedation, administered by CRNA TOPICAL ANESTHETIC:  DESCRIPTION OF PROCEDURE: After the risks benefits and alternatives of the procedure were thoroughly explained, informed consent was obtained.  The    endoscope was introduced through the mouth and advanced to the third portion of the duodenum. Without limitations. The instrument was slowly withdrawn as the mucosa was fully examined.    The upper, middle and distal third of the esophagus were carefully inspected and no abnormalities were noted.  The z-line was well seen at the GEJ.  The endoscope was pushed into the fundus which was normal including a retroflexed view.  The antrum, gastric body, first and second part of the duodenum were unremarkable. Retroflexed views revealed no abnormalities.     The scope was then withdrawn from the patient and the procedure completed.  COMPLICATIONS: There were no complications. ENDOSCOPIC IMPRESSION: Normal EGD  RECOMMENDATIONS: continue PPI  REPEAT EXAM:  Activated:  12/31/2011 2:16 PM Revised: 12/31/2011 2:16 PM  CC:       

## 2011-12-31 NOTE — Transfer of Care (Signed)
Immediate Anesthesia Transfer of Care Note  Patient: [name] [name]  Procedure(s) Performed: Procedure(s) (LRB) with comments: COLONOSCOPY (N/A) ESOPHAGOGASTRODUODENOSCOPY (EGD) (N/A)  Patient Location: PACU  Anesthesia Type: MAC  Level of Consciousness: awake, alert  and patient cooperative  Airway & Oxygen Therapy: Patient Spontanous Breathing and Patient connected to nasal cannula oxygen  Post-op Assessment: Report given to PACU RN, Post -op Vital signs reviewed and stable and Patient moving all extremities  Post vital signs: Reviewed and stable  Complications: No apparent anesthesia complications

## 2012-01-01 NOTE — Anesthesia Postprocedure Evaluation (Addendum)
  Anesthesia Post-op Note  Patient: [name] [name]  Procedure(s) Performed: Procedure(s) (LRB) with comments: COLONOSCOPY (N/A) ESOPHAGOGASTRODUODENOSCOPY (EGD) (N/A)  Patient Location: PACU  Anesthesia Type: MAC  Level of Consciousness: awake, alert , oriented and patient cooperative  Airway and Oxygen Therapy: Patient Spontanous Breathing and Patient connected to nasal cannula oxygen  Post-op Pain: mild  Post-op Assessment: Post-op Vital signs reviewed, Patient's Cardiovascular Status Stable, Respiratory Function Stable, Patent Airway, No signs of Nausea or vomiting and Pain level controlled  Post-op Vital Signs: stable  Complications: No apparent anesthesia complications and soft tissue trauma  ( IV infiltration L Hand w/ LR fluid/Lidocaine/Proprofol/ ) via 22 ga IV. Hand swelling down in PACU and hand had good arterial perfusion at all times. Follow up with Dr Arlyce Dice after D/C.

## 2012-01-01 NOTE — Discharge Summary (Signed)
Seen and examined on the day of DC.  Discussed and agree with Dr. Purvis Sheffield DC, documentation and management.

## 2012-01-01 NOTE — Discharge Summary (Signed)
Physician Discharge Summary  Patient ID: [name] [name] MRN: [id number] DOB: [birth year] Age: 61 y.o.  Admit date: 12/28/2011 Discharge date: 12/31/2011 Admitting Physician: Sanjuana Letters, MD  PCP: Despina Hick, MD  Consultants: GI, Dr. Arlyce Dice     Discharge Diagnosis: Chronic Pain Syndrome Principal Problem:  *Epigastric abdominal pain Active Problems:  DIABETES MELLITUS, TYPE II  DEPRESSION, MAJOR, WITH PSYCHOTIC BEHAVIOR  TOBACCO USER  Chronic pain syndrome  HYPERTENSION, BENIGN ESSENTIAL  GASTROESOPHAGEAL REFLUX DISEASE, CHRONIC  CONSTIPATION  Diabetic gastroparesis associated with type 2 diabetes mellitus  CAD (coronary artery disease)  Benign neoplasm of colon    Hospital Course 62 yo F with multiple ACS risk factors presents with complaint of acute worsening of chronic abdominal pain.   1. Chronic abdominal pain: presented to ED with complaint of epigastric and RUQ pain. She initially stated that the pain started two weeks ago. After extensive questioning she admits that the pain chronic but worsening over the past 6 months or so. The pain is associated with nausea but no emesis. She denied chest pain and shortness of breath. Ruled out ACS with cycled negative cardiac enzyme. LFTs and lipase within normal limits. Started on PPI.  Pain control with prn zofran and morphine while in the hospital. GI was consulted to evaluate for potential GI explanation. EGD was negative. Colonoscopy revealed one poly but no explanation for abdominal pain.  Patient was started on nortriptyline 25 mg daily for chronic pain syndrome.   2. HTN: BPs were elevated throughout hospitalization, most recently at 172/75. Continued home meds including BB and ACE. Hydralazine 5 mg prn for pressures >170 which she received one time.    3. DM2: Last A1c was 10.3.  Patients PO intake was limited to clears initially and patient was on 10 u lantus daily, with sliding scale available.  CBGs in the  mid-100's throughout hospitalization.  Resumed home insulin regimen at discharge.  4. HDL: continued statin   5. Hypokalemia: low in the ED at 3.4. Was repleted and remained on the low end of normal.  Problem List 1. Chronic abdominal pain 2. HTN 3. Diabetes 4. Hyperlipidemia 5. Hypokalemia         Discharge PE   Filed Vitals:   12/31/11 1639  BP:   Pulse:   Temp: 99.4 F (37.4 C)  Resp:    General: NAD, resting comfortably in bed  Cardiovascular: rrr, no murmurs, rubs, or gallops  Respiratory: CTAB, no wheezes  Abdomen: soft, diffusely tender to light palpation, but no outward signs of pain on palpation, no rebound or guarding  Extremities: no edema   Procedures/Imaging:  Dg Chest 2 View  12/28/2011  IMPRESSION: Atelectasis or fibrosis in the lung bases.  No active pulmonary disease.     EGD is negative.  Colo shows a benign appearing polyp - removed.  Labs  CBC  Lab 12/31/11 0527 12/30/11 0459 12/28/11 2136  WBC 4.8 4.7 6.9  HGB 11.6* 11.2* 12.5  HCT 35.9* 35.2* 37.4  PLT 157 147* 143*   BMET  Lab 12/31/11 0527 12/30/11 0459 12/29/11 0640 12/29/11 0301  NA 140 143 134* --  K 3.6 3.7 3.8 --  CL 108 108 100 --  CO2 21 25 24  --  BUN 4* 5* 6 --  CREATININE 0.70 0.69 0.69 --  CALCIUM 8.7 8.5 8.5 --  PROT -- -- -- 7.1  BILITOT -- -- -- 0.2*  ALKPHOS -- -- -- 90  ALT -- -- -- 15  AST -- -- --  21  GLUCOSE 143* 146* 206* --   Results for orders placed during the hospital encounter of 12/28/11 (from the past 72 hour(s))  GLUCOSE, CAPILLARY     Status: Abnormal   Collection Time   12/29/11  4:47 PM      Component Value Range Comment   Glucose-Capillary 138 (*) 70 - 99 mg/dL   CBC     Status: Abnormal   Collection Time   12/30/11  4:59 AM      Component Value Range Comment   WBC 4.7  4.0 - 10.5 K/uL    RBC 4.03  3.87 - 5.11 MIL/uL    Hemoglobin 11.2 (*) 12.0 - 15.0 g/dL    HCT 40.9 (*) 81.1 - 46.0 %    MCV 87.3  78.0 - 100.0 fL    MCH 27.8  26.0 - 34.0  pg    MCHC 31.8  30.0 - 36.0 g/dL    RDW 91.4  78.2 - 95.6 %    Platelets 147 (*) 150 - 400 K/uL   BASIC METABOLIC PANEL     Status: Abnormal   Collection Time   12/30/11  4:59 AM      Component Value Range Comment   Sodium 143  135 - 145 mEq/L    Potassium 3.7  3.5 - 5.1 mEq/L    Chloride 108  96 - 112 mEq/L    CO2 25  19 - 32 mEq/L    Glucose, Bld 146 (*) 70 - 99 mg/dL    BUN 5 (*) 6 - 23 mg/dL    Creatinine, Ser 2.13  0.50 - 1.10 mg/dL    Calcium 8.5  8.4 - 08.6 mg/dL    GFR calc non Af Amer >90  >90 mL/min    GFR calc Af Amer >90  >90 mL/min   GLUCOSE, CAPILLARY     Status: Abnormal   Collection Time   12/30/11  7:51 AM      Component Value Range Comment   Glucose-Capillary 164 (*) 70 - 99 mg/dL   GLUCOSE, CAPILLARY     Status: Abnormal   Collection Time   12/30/11 11:31 AM      Component Value Range Comment   Glucose-Capillary 155 (*) 70 - 99 mg/dL   GLUCOSE, CAPILLARY     Status: Abnormal   Collection Time   12/30/11  4:33 PM      Component Value Range Comment   Glucose-Capillary 148 (*) 70 - 99 mg/dL   CLOSTRIDIUM DIFFICILE BY PCR     Status: Normal   Collection Time   12/30/11  6:56 PM      Component Value Range Comment   C difficile by pcr NEGATIVE  NEGATIVE   OCCULT BLOOD X 1 CARD TO LAB, STOOL     Status: Normal   Collection Time   12/30/11  7:03 PM      Component Value Range Comment   Fecal Occult Bld NEGATIVE     GLUCOSE, CAPILLARY     Status: Abnormal   Collection Time   12/30/11  8:50 PM      Component Value Range Comment   Glucose-Capillary 126 (*) 70 - 99 mg/dL   OCCULT BLOOD X 1 CARD TO LAB, STOOL     Status: Normal   Collection Time   12/30/11  9:22 PM      Component Value Range Comment   Fecal Occult Bld NEGATIVE     CBC     Status: Abnormal  Collection Time   12/31/11  5:27 AM      Component Value Range Comment   WBC 4.8  4.0 - 10.5 K/uL    RBC 4.14  3.87 - 5.11 MIL/uL    Hemoglobin 11.6 (*) 12.0 - 15.0 g/dL    HCT 10.2 (*) 72.5 - 46.0 %     MCV 86.7  78.0 - 100.0 fL    MCH 28.0  26.0 - 34.0 pg    MCHC 32.3  30.0 - 36.0 g/dL    RDW 36.6  44.0 - 34.7 %    Platelets 157  150 - 400 K/uL   BASIC METABOLIC PANEL     Status: Abnormal   Collection Time   12/31/11  5:27 AM      Component Value Range Comment   Sodium 140  135 - 145 mEq/L    Potassium 3.6  3.5 - 5.1 mEq/L    Chloride 108  96 - 112 mEq/L    CO2 21  19 - 32 mEq/L    Glucose, Bld 143 (*) 70 - 99 mg/dL    BUN 4 (*) 6 - 23 mg/dL    Creatinine, Ser 4.25  0.50 - 1.10 mg/dL    Calcium 8.7  8.4 - 95.6 mg/dL    GFR calc non Af Amer >90  >90 mL/min    GFR calc Af Amer >90  >90 mL/min   GLUCOSE, CAPILLARY     Status: Abnormal   Collection Time   12/31/11  7:43 AM      Component Value Range Comment   Glucose-Capillary 156 (*) 70 - 99 mg/dL    Comment 1 Notify RN     GLUCOSE, CAPILLARY     Status: Abnormal   Collection Time   12/31/11 11:46 AM      Component Value Range Comment   Glucose-Capillary 135 (*) 70 - 99 mg/dL    Comment 1 Notify RN     GLUCOSE, CAPILLARY     Status: Abnormal   Collection Time   12/31/11  2:47 PM      Component Value Range Comment   Glucose-Capillary 107 (*) 70 - 99 mg/dL        Patient condition at time of discharge/disposition: stable  Disposition-home   Follow up issues: 1. Chronic abdominal pain: started on nortryptiline while in house, consider that this is due to a chronic pain syndrome and treat without narcotics-consider referral to pain clinic 2. HTN: blood pressures elevated in hospital while on home medications, continue to follow and consider adjusting medications appropriately   Discharge follow up:  as below in discharge instructions.  Discharge Instructions: Please refer to Patient Instructions section of EMR for full details.  Patient was counseled important signs and symptoms that should prompt return to medical care, changes in medications, dietary instructions, activity restrictions, and follow up appointments.   Significant instructions noted below:  Discharge Orders    Future Appointments: Provider: Department: Dept Phone: Center:   01/08/2012 10:15 AM Phebe Colla, MD [facility] [phone] [facility]   01/15/2012 10:00 AM Phebe Colla, MD [facility] [phone] [facility]   01/20/2012 11:00 AM Louis Meckel, MD [facility] [place] [facility] [phone] [facility]   01/29/2012 9:45 AM Kathrin Ruddy, PHARMD [facility] [phone] [facility]     Future Orders Please Complete By Expires   Diet - low sodium heart healthy      Increase activity slowly      Call MD for:  temperature >100.4      Call MD for:  persistant nausea and vomiting      Call MD for:  severe uncontrolled pain      Call MD for:  redness, tenderness, or signs of infection (pain, swelling, redness, odor or green/yellow discharge around incision site)      Call MD for:  difficulty breathing, headache or visual disturbances      Call MD for:  hives      Call MD for:  persistant dizziness or light-headedness      Call MD for:  extreme fatigue          Discharge Medications   Medication List     As of 01/01/2012 12:01 PM    START taking these medications         nortriptyline 25 MG capsule   Commonly known as: PAMELOR   Take 1 capsule (25 mg total) by mouth at bedtime.      CONTINUE taking these medications         * albuterol 108 (90 BASE) MCG/ACT inhaler   Commonly known as: PROVENTIL HFA;VENTOLIN HFA      * albuterol 108 (90 BASE) MCG/ACT inhaler   Commonly known as: PROVENTIL HFA;VENTOLIN HFA      ARTIFICIAL TEARS OP      aspirin EC 81 MG tablet      BEPREVE 1.5 % Soln   Generic drug: Bepotastine Besilate      * cyclobenzaprine 10 MG tablet   Commonly known as: FLEXERIL   Take 1 tablet (10 mg total) by mouth 3 (three) times daily as needed for muscle spasms.      * cyclobenzaprine 10 MG tablet   Commonly known as: FLEXERIL      docusate sodium 100 MG capsule   Commonly known as: COLACE       esomeprazole 40 MG capsule   Commonly known as: NEXIUM   Take 1 capsule (40 mg total) by mouth daily before breakfast.      * fluticasone 50 MCG/ACT nasal spray   Commonly known as: FLONASE      * fluticasone 50 MCG/ACT nasal spray   Commonly known as: FLONASE      gabapentin 400 MG capsule   Commonly known as: NEURONTIN      glucose blood test strip   Quantity sufficient for twice daily testing.      hydrocortisone 2.5 % rectal cream   Commonly known as: ANUSOL-HC   Place rectally 2 (two) times daily.      insulin glargine 100 UNIT/ML injection   Commonly known as: LANTUS   Inject 60 Units into the skin daily.      Insulin Pen Needle 31G X 5 MM Misc   1 Units by Does not apply route 3 (three) times daily.      ketorolac 0.5 % ophthalmic solution   Commonly known as: ACULAR      levocetirizine 5 MG tablet   Commonly known as: XYZAL   Take 1 tablet (5 mg total) by mouth every evening.      lisinopril 20 MG tablet   Commonly known as: PRINIVIL,ZESTRIL   Take 2 tablets (40 mg total) by mouth daily.      metoCLOPramide 10 MG tablet   Commonly known as: REGLAN   Take 1 tablet (10 mg total) by mouth 3 (three) times daily before meals.      metoprolol 50 MG tablet   Commonly known as: LOPRESSOR  Take 3 tablets (150 mg total) by mouth 2 (two) times daily.      montelukast 10 MG tablet   Commonly known as: SINGULAIR   Take 1 tablet (10 mg total) by mouth at bedtime.      nicotine polacrilex 2 MG gum   Commonly known as: NICORETTE      nitroGLYCERIN 0.4 MG SL tablet   Commonly known as: NITROSTAT   Place 1 tablet (0.4 mg total) under the tongue every 5 (five) minutes as needed for chest pain.      nystatin cream   Commonly known as: MYCOSTATIN      ondansetron 4 MG tablet   Commonly known as: ZOFRAN      ONETOUCH DELICA LANCETS Misc      pentosan polysulfate 100 MG capsule   Commonly known as: ELMIRON      phenazopyridine 200 MG tablet   Commonly known as:  PYRIDIUM      polyethylene glycol packet   Commonly known as: MIRALAX / GLYCOLAX      pravastatin 40 MG tablet   Commonly known as: PRAVACHOL   Take 1 tablet (40 mg total) by mouth daily.      prednisoLONE acetate 1 % ophthalmic suspension   Commonly known as: PRED FORTE      QUEtiapine 200 MG tablet   Commonly known as: SEROQUEL      ranitidine 150 MG tablet   Commonly known as: ZANTAC     * Notice: This list has 6 medication(s) that are the same as other medications prescribed for you. Read the directions carefully, and ask your doctor or other care provider to review them with you.    STOP taking these medications         nystatin 100000 UNIT/ML suspension   Commonly known as: MYCOSTATIN          Where to get your medications    These are the prescriptions that you need to pick up. We sent them to a specific pharmacy, so you will need to go there to get them.   [facility] - [place], [place] - [address]    [address] [place] [address]    Phone: [phone]        nortriptyline 25 MG capsule                Marikay Alar, MD of [facility] 01/01/2012 12:01 PM

## 2012-01-01 NOTE — Progress Notes (Signed)
Pt/family given discharge instructions, medication lists, follow up appointments, and when to call the doctor.  Pt/family verbalizes understanding. [name] [name]    

## 2012-01-02 ENCOUNTER — Encounter (HOSPITAL_COMMUNITY): Payer: Self-pay | Admitting: Gastroenterology

## 2012-01-06 ENCOUNTER — Telehealth: Payer: Self-pay | Admitting: Emergency Medicine

## 2012-01-06 NOTE — Telephone Encounter (Signed)
Will fwd. To PCP to address. .Nishant Schrecengost  

## 2012-01-06 NOTE — Telephone Encounter (Signed)
[name] [name], from [facility] calling to report patient having a Level 1 drug interaction.  Regalan and Seroquel.  Also need to request PT for patient.  Please contact her at phone number given for authorizaton.

## 2012-01-08 ENCOUNTER — Ambulatory Visit (INDEPENDENT_AMBULATORY_CARE_PROVIDER_SITE_OTHER): Payer: Medicare Other | Admitting: Emergency Medicine

## 2012-01-08 ENCOUNTER — Encounter: Payer: Self-pay | Admitting: Emergency Medicine

## 2012-01-08 VITALS — BP 170/92 | HR 80 | Ht 63.0 in | Wt 222.0 lb

## 2012-01-08 DIAGNOSIS — G894 Chronic pain syndrome: Secondary | ICD-10-CM

## 2012-01-08 DIAGNOSIS — I10 Essential (primary) hypertension: Secondary | ICD-10-CM

## 2012-01-08 MED ORDER — TRIAMCINOLONE ACETONIDE 0.1 % EX CREA: TOPICAL_CREAM | Freq: Two times a day (BID) | CUTANEOUS | Status: DC

## 2012-01-08 MED ORDER — METOPROLOL TARTRATE 100 MG PO TABS: 200.0000 mg | ORAL_TABLET | Freq: Two times a day (BID) | ORAL | Status: DC

## 2012-01-08 MED ORDER — NORTRIPTYLINE HCL 25 MG PO CAPS: 50.0000 mg | ORAL_CAPSULE | Freq: Every day | ORAL | Status: DC

## 2012-01-08 MED ORDER — ATORVASTATIN CALCIUM 20 MG PO TABS: 20.0000 mg | ORAL_TABLET | Freq: Every day | ORAL | Status: DC

## 2012-01-08 MED ORDER — METOCLOPRAMIDE HCL 10 MG PO TABS: 10.0000 mg | ORAL_TABLET | Freq: Three times a day (TID) | ORAL | Status: DC

## 2012-01-08 NOTE — Assessment & Plan Note (Signed)
Elevated today.  Will increase metoprolol to 200mg  BID.  Continue lisinopril 40mg  daily.  Reluctant to restart amlodipine given history of lower extremity edema that improved when it was stopped.  Follow up in 2 weeks.

## 2012-01-08 NOTE — Patient Instructions (Signed)
It was nice to see you - I'm sorry you had such a rough time in the hospital.  Please keep working on filing a complaint - it is important that your voice be heard.  Skin - I sent a prescription for triamcinolone cream, use it twice a day.  If there is no improvement in 2 weeks, come back.  Blood pressure - It was a little elevated today. We increased your metoprolol to 200mg  BID.  Follow up in 2 weeks.  Abdominal pain - The tightening around your ribs is like muscular pain from muscle strain.  It should get better.  We increased your nortriptyline to 50mg  at bedtime.  Follow up in 2 weeks to check an EKG as this medicine can sometimes cause something prolonged QTc.  I will see you back in 2 weeks for follow up.

## 2012-01-08 NOTE — Assessment & Plan Note (Signed)
Improved on nortriptyline.  Will increase dose to 50mg  qHS.  Discussed concern for prolonged QTc (on reglan, nortriptyline and seroquel) with patient.  She is to return in 2 weeks for follow up, will check EKG at that time.

## 2012-01-08 NOTE — Telephone Encounter (Signed)
[facility]ed [facility] back to inform them that patient does not want referral to PT at this time.  Also discussed medications with patient at her appt today.  Continue reglan and seroquel for now.  Pt to return in 2 weeks to check EKG.

## 2012-01-08 NOTE — Progress Notes (Signed)
  Subjective:    Patient ID: [name] [name], female    DOB: 15-Mar-1951, 61 y.o.   MRN: [id number]  HPI [name] [name] is here for hospital follow up.  1. Abdominal pain: Patient was admitted to the hospital for abdominal pain.  EGD and colonoscopy were normal and she was started on Nortriptyline 25mg  qHS.  She reports pain is better, particularly when she takes the nortriptyline with the seroquel.  Reglan working for nausea with meals.  Appetite still not back to normal.  Does have some "light pain" across lower rib cage that will tighten occasionally.  Of note, during her hospitalization, she woke up while colonoscopy was being done as well as had an allergic reaction to the tape used on her skin.  She is understandably quite upset by this and working on filing a formal complaint.  Listened and validated patients feelings.  2. Rash: Located on chest and arms where EKG leads were placed.  Patient reports trying OTC hydrocortisone cream with no improvement.  3. HTN: BP elevated today.  May be secondary to emotional state.  Compliant with medications.  I have reviewed and updated the following as appropriate: allergies and current medications SHx: current smoker, working on quitting   Review of Systems See HPI    Objective:   Physical Exam BP 170/92  Pulse 80  Ht 5\' 3"  (1.6 m)  Wt 222 lb (100.699 kg)  BMI 39.33 kg/m2 Repeat BP: 150/75 Gen: alert, cooperative, visibly upset by hospital experience HEENT: AT/[place], sclera white, MMM CV: RRR, no murmurs Pulm: CTAB, no wheezes or rales Abd: +BS, soft, ND, tenderness along lower rib cage to palpation Skin: multiple areas of skin irritation where EKG leads were placed; no weeping or erythema      Assessment & Plan:  Skin rash: Likely sensitivity to adhesive used in hospital.  Will give triamcinolone cream BID.  Follow up in 2 weeks.

## 2012-01-15 ENCOUNTER — Ambulatory Visit: Payer: Medicare Other | Admitting: Emergency Medicine

## 2012-01-20 ENCOUNTER — Ambulatory Visit: Payer: Medicare Other | Admitting: Gastroenterology

## 2012-01-23 ENCOUNTER — Ambulatory Visit (INDEPENDENT_AMBULATORY_CARE_PROVIDER_SITE_OTHER): Payer: Medicare Other | Admitting: Emergency Medicine

## 2012-01-23 ENCOUNTER — Encounter: Payer: Self-pay | Admitting: Emergency Medicine

## 2012-01-23 VITALS — BP 176/115 | HR 96 | Temp 99.0°F | Ht 63.0 in | Wt 225.0 lb

## 2012-01-23 DIAGNOSIS — I10 Essential (primary) hypertension: Secondary | ICD-10-CM

## 2012-01-23 DIAGNOSIS — G894 Chronic pain syndrome: Secondary | ICD-10-CM

## 2012-01-23 MED ORDER — KETOROLAC TROMETHAMINE 60 MG/2ML IJ SOLN
60.0000 mg | Freq: Once | INTRAMUSCULAR | Status: AC
  Administered 2012-01-23: 60 mg via INTRAMUSCULAR

## 2012-01-23 MED ORDER — CYCLOBENZAPRINE HCL 10 MG PO TABS: 10.0000 mg | ORAL_TABLET | Freq: Three times a day (TID) | ORAL | Status: DC | PRN

## 2012-01-23 MED ORDER — PREGABALIN 75 MG PO CAPS: 75.0000 mg | ORAL_CAPSULE | Freq: Two times a day (BID) | ORAL | Status: DC

## 2012-01-23 NOTE — Assessment & Plan Note (Signed)
Quite elevated today, but difficult to interpret given concurrent pain.  Will work on pain control and reassess BP once pain better.

## 2012-01-23 NOTE — Progress Notes (Signed)
  Subjective:    Patient ID: [name] [name], female    DOB: [birth year], 61 y.o.   MRN: [id number]  HPI [name] [name] is here for pain.  She reports terrible diffuse back and arm pain.  Feels like knives in her bones and someone is "stripping" her bones.  Nortriptyline and gabapentin are not helping.  Has some pins and needles sensation in hands.  No leg pain.  No weakness.   I have reviewed and updated the following as appropriate: allergies and current medications SHx: current smoker   Review of Systems See HPI    Objective:   Physical Exam BP 176/115  Pulse 96  Temp 99 F (37.2 C) (Oral)  Ht 5\' 3"  (1.6 m)  Wt 225 lb (102.059 kg)  BMI 39.86 kg/m2 Gen: alert, cooperative, intermittently appears uncomfortable, visible upset CV: RRR, no murmurs Pulm: CTAB Back: no erythema, edema, deformity; diffuse tenderness over entire back and shoulders Neck: reluctant to move head due to pain, but has good range of motion, Spurling's negative Neuro: weak grip but poor effort secondary to pain, 5/5 biceps/triceps strength, sensation intact to light touch in bilateral upper extremities, 2+ biceps reflex      Assessment & Plan:

## 2012-01-23 NOTE — Patient Instructions (Signed)
I'm sorry you are having more pain.  STOP nortriptyline START lyrica 75mg  twice a day Slowly stop gabapentin  -take 400mg  three times a day for 3 days  -then 400mg  twice a day for 3 days  -then 400mg  once a day for 3 days   I have also sent a prescription for Flexeril to your pharmacy. Follow up in 2 weeks.

## 2012-01-23 NOTE — Assessment & Plan Note (Signed)
Poorly controlled today with patient visible upset. No red flags for nerve involvment.  Will stop nortriptyline and taper gabapentin off.  Start lyrica 75mg  BID.  Also given flexeril for pain control.  Toradol 60mg  IM in clinic.  Follow up in 2 weeks.  Will likely increase lyrica at that time to 150mg  BID.

## 2012-01-29 ENCOUNTER — Encounter: Payer: Self-pay | Admitting: Pharmacist

## 2012-01-29 ENCOUNTER — Ambulatory Visit (INDEPENDENT_AMBULATORY_CARE_PROVIDER_SITE_OTHER): Payer: Medicare Other | Admitting: Pharmacist

## 2012-01-29 ENCOUNTER — Ambulatory Visit: Payer: Medicare Other | Admitting: Pharmacist

## 2012-01-29 VITALS — BP 153/86 | HR 85 | Ht 64.0 in | Wt 222.0 lb

## 2012-01-29 DIAGNOSIS — E119 Type 2 diabetes mellitus without complications: Secondary | ICD-10-CM

## 2012-01-29 MED ORDER — INSULIN GLARGINE 100 UNIT/ML ~~LOC~~ SOLN: 30.0000 [IU] | Freq: Two times a day (BID) | SUBCUTANEOUS | Status: DC

## 2012-01-29 NOTE — Assessment & Plan Note (Signed)
61 yo female with longstanding diabetes currently improved control of blood glucose based majority of CBG's < 200. Denies hypoglycemic events. She is able to verbalize appropriate hypoglycemia management plan. Plan to continue basal insulin (Lantus) 30 units in the morning and 30 units in the evening. Patient was given 4 Lantus Solostar samples and will pick up lancets at her local pharmacy. Patient instructed to test prandial CBG (2-hours post meals) and bring in results during next visit. Written patient instructions provided.  Follow up in  Pharmacist Clinic Visit in at the end of the month. Total time in face to face counseling 35 minutes. Patient seen with Tiney Rouge, PharmD Candidate.

## 2012-01-29 NOTE — Patient Instructions (Addendum)
Thank you for coming in today. Please continue your Lantus 30 units in the morning and 30 units in the evening. Please check your blood sugars 2 hours after meals in addition to checking every morning and evening. Please bring in your glucometer during your next visit. Make sure you make an appointment with me and I will see you at the end of the month.

## 2012-01-29 NOTE — Progress Notes (Signed)
  Subjective:    Patient ID: [name] [name], female    DOB: 10/28/1950, 61 y.o.   MRN: [id number]  HPI Pt returns to clinic for insulin management follow up. She was recently discharged from [facility] two weeks ago. Her primary DM-related complaint today is due to her inability to afford insulin and diabetic supplies. Patient is in the Medicare Part D coverage gap and has recently applied for Medicaid. She is expecting a final decision regarding her Medicaid eligibility at the end of the month. She is currently without lancets and states that she only has one last dose left of insulin. Additionally, she endorses pain from fibromyalgia which is currently treated with pregabalin and cyclobenzaprine.   Review of Systems     Objective:   Physical Exam CBG's log: recent readings < 200 ranging 153-197.      Assessment & Plan:  61 yo female with longstanding diabetes currently improved control of blood glucose based majority of CBG's < 200. Denies hypoglycemic events. She is able to verbalize appropriate hypoglycemia management plan. Plan to continue basal insulin (Lantus) 30 units in the morning and 30 units in the evening. Patient was given 4 Lantus Solostar samples and will pick up lancets at her local pharmacy. Patient instructed to test prandial CBG (2-hours post meals) and bring in results during next visit. Written patient instructions provided.  Follow up in  Pharmacist Clinic Visit in at the end of the month. Total time in face to face counseling 35 minutes. Patient seen with Tiney Rouge, PharmD Candidate.

## 2012-01-30 NOTE — Progress Notes (Signed)
Patient ID: [name] [name], female   DOB: 06/27/1950, 61 y.o.   MRN: [id number] Reviewed and agree with Dr. Koval's management and documentation. 

## 2012-02-06 ENCOUNTER — Ambulatory Visit: Payer: Medicare Other | Admitting: Emergency Medicine

## 2012-02-06 ENCOUNTER — Ambulatory Visit: Payer: Medicare Other | Admitting: Pharmacist

## 2012-02-11 ENCOUNTER — Telehealth: Payer: Self-pay | Admitting: Emergency Medicine

## 2012-02-11 NOTE — Telephone Encounter (Signed)
Ok to give

## 2012-02-11 NOTE — Telephone Encounter (Signed)
Patient has refused Home Health since early September and the nurse would like to discharge the patient.  She just needs a verbal ok.

## 2012-02-12 NOTE — Telephone Encounter (Signed)
Okay to be discharged from [facility] from my perspective.

## 2012-02-12 NOTE — Telephone Encounter (Signed)
[name] [name], at [facility] [place] to D/C pt from services per Dr Elwyn Reach.

## 2012-02-15 ENCOUNTER — Telehealth: Payer: Self-pay | Admitting: Emergency Medicine

## 2012-02-15 NOTE — Telephone Encounter (Signed)
GI referral cancelled as she had a GI evaluation during her recent admission.

## 2012-02-18 ENCOUNTER — Ambulatory Visit: Payer: Medicare Other | Admitting: Emergency Medicine

## 2012-02-19 ENCOUNTER — Encounter: Payer: Self-pay | Admitting: Pharmacist

## 2012-02-19 ENCOUNTER — Ambulatory Visit (INDEPENDENT_AMBULATORY_CARE_PROVIDER_SITE_OTHER): Payer: Medicare Other | Admitting: Pharmacist

## 2012-02-19 VITALS — BP 165/88 | HR 87 | Ht 64.0 in | Wt 224.8 lb

## 2012-02-19 DIAGNOSIS — E119 Type 2 diabetes mellitus without complications: Secondary | ICD-10-CM

## 2012-02-19 DIAGNOSIS — G894 Chronic pain syndrome: Secondary | ICD-10-CM

## 2012-02-19 MED ORDER — PREGABALIN 75 MG PO CAPS: 75.0000 mg | ORAL_CAPSULE | Freq: Two times a day (BID) | ORAL | Status: DC

## 2012-02-19 MED ORDER — PENTOSAN POLYSULFATE SODIUM 100 MG PO CAPS: 100.0000 mg | ORAL_CAPSULE | Freq: Three times a day (TID) | ORAL | Status: DC

## 2012-02-19 MED ORDER — MONTELUKAST SODIUM 10 MG PO TABS: 10.0000 mg | ORAL_TABLET | Freq: Every day | ORAL | Status: DC

## 2012-02-19 NOTE — Assessment & Plan Note (Signed)
Diabetes of long-standing duration currently under inadequate control of blood glucose based on increasing home CBGs.  Lab Results  Component Value Date   HGBA1C 10.3 11/04/2011  Pt's home fasting and random CBG readings range from 215-418 with most in the 300's.  Control is suboptimal due to increased stressful life events and inconsistent meal schedule. Denies hypoglycemic events.  Able to verbalize appropriate hypoglycemia management plan. Started rapid insulin Novolog (insulin aspart) to 10 units TID prior to each meal. Patient is to continue basal insulin of Lantus 30 units QAM and QPM. She was provided with 2 boxes of Lantus Solostar and 1 Box of Novolog pens which is expected to last her for at least 10 days.  Written patient instructions provided.  Follow up in  Pharmacist Clinic Visit in 10 days.   Total time in face to face counseling 35 minutes.  Patient seen with Tiney Rouge, PharmD Candidate and Lillia Pauls, PharmD, Pharmacy Resident.

## 2012-02-19 NOTE — Progress Notes (Signed)
  Subjective:    Patient ID: [name] [name], female    DOB: 1950-09-12, 61 y.o.   MRN: [id number]  HPI SUBJECTIVE: 61 y.o. female arrives in good spirit for follow up of diabetes. She reports feeling better and denies symptoms of hypoglycemia.  She is still pending Medicaid eligibility and is needing Lantus insulin pens today. Due to her inability to afford test strips, she was only able to provide CBGs from 10/28-10/31 since her last visit on 01/29/12. Pt reports her inability to maintain a consistent diet and describes this morning's CBG elevating into 300's approximately 30 minutes after having 1 banana and a cup of black coffee. She reports undergoing tremendous stress since her last visit mainly due to her inability to afford her medications. Patient is accompanied with her grandchild whom provides a main source of emotional support.      Review of Systems     Objective:   Physical Exam Ht 5\' 4"  (1.626 m)  Wt 224 lb 12.8 oz (101.969 kg)  BMI 38.59 kg/m2       Assessment & Plan:   Diabetes of long-standing duration currently under inadequate control of blood glucose based on increasing home CBGs.  Lab Results  Component Value Date   HGBA1C 10.3 11/04/2011   Pt's home fasting and random CBG readings range from 215-418 with most in the 300's.  Control is suboptimal due to increased stressful life events and inconsistent meal schedule. Denies hypoglycemic events.  Able to verbalize appropriate hypoglycemia management plan. Started rapid insulin Novolog (insulin aspart) to 10 units TID prior to each meal. Patient is to continue basal insulin of Lantus 30 units QAM and QPM. She was provided with 2 boxes of Lantus Solostar and 1 Box of Novolog pens which is expected to last her for at least 10 days.  Written patient instructions provided.  Follow up in  Pharmacist Clinic Visit in 10 days.   Total time in face to face counseling 35 minutes.  Patient seen with Tiney Rouge, PharmD Candidate  and Lillia Pauls, PharmD, Pharmacy Resident.

## 2012-02-19 NOTE — Patient Instructions (Addendum)
Thank you for coming in today. We want to see better control of your blood sugars and our plan is to start you on meal-time insulin.   Please use 10 units of Novolog prior to each meal. Continue your Lantus 30 units in the morning and 30 units in the evening.  In addition continue to check your blood sugar and bring your glucometer for your next visit.   Please schedule an appointment with me in about 10 days on your way out.   We are glad to hear that you feel better and please give me a call if you have any questions.

## 2012-02-19 NOTE — Progress Notes (Signed)
  Subjective:    Patient ID: [name] [name], female    DOB: 10-22-1950, 61 y.o.   MRN: [id number]  HPI Reviewed and agree with documentation and management.    Review of Systems     Objective:   Physical Exam        Assessment & Plan:

## 2012-02-24 ENCOUNTER — Telehealth: Payer: Self-pay | Admitting: *Deleted

## 2012-02-24 DIAGNOSIS — G894 Chronic pain syndrome: Secondary | ICD-10-CM

## 2012-02-24 NOTE — Telephone Encounter (Signed)
Patient requesting a refill on Lyrica.  Refill request is in your box.

## 2012-02-25 MED ORDER — PREGABALIN 75 MG PO CAPS: 150.0000 mg | ORAL_CAPSULE | Freq: Two times a day (BID) | ORAL | Status: DC

## 2012-02-25 NOTE — Telephone Encounter (Signed)
Called in prescription for Lyrica.  Will increase dose to 150mg  (2 capsules) twice a day as discussed at appt the beginning of October.

## 2012-03-01 ENCOUNTER — Encounter: Payer: Self-pay | Admitting: Emergency Medicine

## 2012-03-01 ENCOUNTER — Ambulatory Visit (INDEPENDENT_AMBULATORY_CARE_PROVIDER_SITE_OTHER): Payer: Medicare Other | Admitting: Emergency Medicine

## 2012-03-01 ENCOUNTER — Ambulatory Visit (INDEPENDENT_AMBULATORY_CARE_PROVIDER_SITE_OTHER): Payer: Medicare Other | Admitting: Pharmacist

## 2012-03-01 ENCOUNTER — Encounter: Payer: Self-pay | Admitting: Pharmacist

## 2012-03-01 ENCOUNTER — Encounter: Payer: Self-pay | Admitting: Home Health Services

## 2012-03-01 VITALS — Ht 63.5 in | Wt 222.5 lb

## 2012-03-01 VITALS — BP 154/77 | HR 84 | Ht 63.5 in | Wt 222.5 lb

## 2012-03-01 DIAGNOSIS — G894 Chronic pain syndrome: Secondary | ICD-10-CM

## 2012-03-01 DIAGNOSIS — I10 Essential (primary) hypertension: Secondary | ICD-10-CM

## 2012-03-01 DIAGNOSIS — Z0000 Encounter for general adult medical examination without abnormal findings: Secondary | ICD-10-CM

## 2012-03-01 DIAGNOSIS — E119 Type 2 diabetes mellitus without complications: Secondary | ICD-10-CM

## 2012-03-01 MED ORDER — PREGABALIN 150 MG PO CAPS: 150.0000 mg | ORAL_CAPSULE | Freq: Two times a day (BID) | ORAL | Status: DC

## 2012-03-01 MED ORDER — TRAMADOL HCL 50 MG PO TABS
50.0000 mg | ORAL_TABLET | Freq: Three times a day (TID) | ORAL | Status: DC | PRN
Start: 2012-03-01 — End: 2012-04-15

## 2012-03-01 MED ORDER — INSULIN ASPART 100 UNIT/ML ~~LOC~~ SOLN: 15.0000 [IU] | Freq: Three times a day (TID) | SUBCUTANEOUS | Status: DC

## 2012-03-01 MED ORDER — "INSULIN SYRINGE-NEEDLE U-100 29G X 7/16"" 1 ML MISC": 1.0000 | Freq: Three times a day (TID) | Status: DC

## 2012-03-01 NOTE — Patient Instructions (Addendum)
Increase your Lantus to 34 units twice daily.   Increase your Novolog to 15 units prior to each meal.   Next visit with Pharmacy Clinic in early-mid December

## 2012-03-01 NOTE — Progress Notes (Signed)
Patient ID: [name] [name], female   DOB: 09/05/1950, 61 y.o.   MRN: [id number] Reviewed and agree with Dr. Koval's management and documentation. 

## 2012-03-01 NOTE — Progress Notes (Signed)
  Subjective:    Patient ID: [name] [name], female    DOB: 04/03/1951, 61 y.o.   MRN: [id number]  HPI  Patient arrives in good spirits, walking with assistance of a 4 point cane.   She reports that she forgot to bring in her meter but notes that he AM fasting readings have been < 200 (190s is what she reports).  However, her other readings of the day are in the 200s.   She reports readings as high as 290.   She continues to have more readings > 200 than < 200.   She notes that she purchased a supply of Lantus Pens to provide her insulin through the end of the month.   She is trying to exercise more including using hand weights.  She reports using 3-4 cigarettes per day.   Review of Systems     Objective:   Physical Exam        Assessment & Plan:  . Diabetes of many yrs duration currently under improved  control of blood glucose based on  . Lab Results  Component Value Date   HGBA1C 10.3 11/04/2011     ,home fasting CBG readings of 100 and 200 readings (denies readings in the 300s). Control is suboptimal due to dietary indiscretion.  She was advised to avoid large glasses of apples juice.  Denies hypoglycemic events.  Able to verbalize appropriate hypoglycemia management plan. Increased dose of basal insulin Lantus (insulin glargine) to 34 units twice daily.  Novolog insulin was increased to 15 units per meal. Sample vial was provided and Vial injection technique was provided.     Written patient instructions provided.  Follow up in  Pharmacist Clinic Visit in early to mid December.   Total time in face to face counseling 20 minutes.

## 2012-03-01 NOTE — Progress Notes (Signed)
  Subjective:    Patient ID: [name] [name], female    DOB: 1950/06/02, 61 y.o.   MRN: [id number]  HPI [name] [name] is here for f/u of pain.  She reports continued diffuse pain, but worse in her right neck and down the arm.  She has been seen by Dr. Prince Rome at [facility] who referred her for a NCS with Dr. Alvester Morin.  She reports that she does have nerve pathology, but I have not received that report yet.  States the pain starts in her neck and travels all the way down her arm to her whole hand.  Also has some numbness and tingling in the whole arm.  States the Lyrica is maybe helping a little, but does not have refills.   I have reviewed and updated the following as appropriate: allergies and current medications SHx: current smoker   Review of Systems See HPI    Objective:   Physical Exam BP 154/77  Pulse 84  Ht 5' 3.5" (1.613 m)  Wt 222 lb 8 oz (100.925 kg)  BMI 38.80 kg/m2 Gen: alert, cooperative but irritable HEENT: AT/[place], sclera white, MMM CV: RRR, no murmurs Pulm: CTAB, no wheezes or rales Neck: no erytheme or edema, ROM limited in left rotation, rigth tilt, and extension due to pain; spurlings positive on the right Neuro: sensation grossly intact to light touch in bilateral upper extremities, biceps reflex 1+ and symmetric, right arm (biceps, triceps and grip) slightly weak compared to left     Assessment & Plan:

## 2012-03-01 NOTE — Patient Instructions (Addendum)
It was good to see you again.  I'm sorry you're still having pain.  1. I have given you a prescription for Lyrica.  Take 1 capsule (150mg ) twice a day. 2. I sent tramadol to your pharmacy.  Take 1-2 tablets every 8hrs as needed.  Follow up with me about 1 week after seeing Dr. Ophelia Charter.

## 2012-03-01 NOTE — Assessment & Plan Note (Signed)
Remains poorly controlled on Lyrica 75mg  BID.  These is some concern for nerve impingement given that he worse pain is in the right neck and travels down her arm.  She has an appt with Dr. Ophelia Charter at [facility] on 11/14.  Will incresae dose of Lyrica to 150mg  BID.  Depending on results of visit with Dr. Ophelia Charter will adjust pain medications.  I think that she would benefit from referral to pain management as I have tried multiple medications (gabapentin, nortriptyline, lyrica) with limited success.  Additionally, I think it would be beneficial to separate her pain management from her chronic disease management as I find that the majority of our visits are spend on her pain rather than her chronic issues.

## 2012-03-01 NOTE — Assessment & Plan Note (Signed)
S/p complete hysterectomy for fibroids.  No longer needs cervical cancer screening.

## 2012-03-01 NOTE — Assessment & Plan Note (Signed)
Continues to have significant pain which makes her blood pressure difficult to assess.  Will continue to monitor for now.

## 2012-03-03 ENCOUNTER — Encounter: Payer: Self-pay | Admitting: Home Health Services

## 2012-03-15 ENCOUNTER — Ambulatory Visit: Payer: Medicare Other | Admitting: Emergency Medicine

## 2012-03-17 NOTE — Op Note (Signed)
[facility] [address] [place], [address]   [id number] PROCEDURE REPORT  PATIENT: [name] [name], [name] [name]  MR#: [id number] BIRTHDATE: 07/09/1950 , 61  yrs. old GENDER: Female ENDOSCOPIST: Louis Meckel, MD REFERRED BY: PROCEDURE DATE:  12/31/2011 PROCEDURE:  EGD, diagnostic ASA CLASS:     Class II INDICATIONS: MEDICATIONS: MAC sedation, administered by CRNA TOPICAL ANESTHETIC:  DESCRIPTION OF PROCEDURE: After the risks benefits and alternatives of the procedure were thoroughly explained, informed consent was obtained.  The    endoscope was introduced through the mouth and advanced to the third portion of the duodenum. Without limitations. The instrument was slowly withdrawn as the mucosa was fully examined.    The upper, middle and distal third of the esophagus were carefully inspected and no abnormalities were noted.  The z-line was well seen at the GEJ.  The endoscope was pushed into the fundus which was normal including a retroflexed view.  The antrum, gastric body, first and second part of the duodenum were unremarkable. Retroflexed views revealed no abnormalities.     The scope was then withdrawn from the patient and the procedure completed.  COMPLICATIONS: There were no complications. ENDOSCOPIC IMPRESSION: Normal EGD  RECOMMENDATIONS: continue PPI  REPEAT EXAM:  Activated:  12/31/2011 2:16 PM Revised: 12/31/2011 2:16 PM  CC:

## 2012-03-22 ENCOUNTER — Encounter: Payer: Self-pay | Admitting: Home Health Services

## 2012-03-29 ENCOUNTER — Other Ambulatory Visit: Payer: Self-pay | Admitting: Orthopaedic Surgery

## 2012-03-29 DIAGNOSIS — M47812 Spondylosis without myelopathy or radiculopathy, cervical region: Secondary | ICD-10-CM

## 2012-04-02 ENCOUNTER — Encounter: Payer: Self-pay | Admitting: Pharmacist

## 2012-04-02 ENCOUNTER — Ambulatory Visit (INDEPENDENT_AMBULATORY_CARE_PROVIDER_SITE_OTHER): Payer: Medicare Other | Admitting: Pharmacist

## 2012-04-02 VITALS — BP 152/78 | HR 82 | Ht 63.5 in | Wt 226.0 lb

## 2012-04-02 DIAGNOSIS — E1165 Type 2 diabetes mellitus with hyperglycemia: Secondary | ICD-10-CM

## 2012-04-02 DIAGNOSIS — IMO0001 Reserved for inherently not codable concepts without codable children: Secondary | ICD-10-CM

## 2012-04-02 DIAGNOSIS — E119 Type 2 diabetes mellitus without complications: Secondary | ICD-10-CM

## 2012-04-02 MED ORDER — INSULIN ASPART 100 UNIT/ML ~~LOC~~ SOLN: 15.0000 [IU] | Freq: Three times a day (TID) | SUBCUTANEOUS | Status: DC

## 2012-04-02 MED ORDER — INSULIN GLARGINE 100 UNIT/ML ~~LOC~~ SOLN: 34.0000 [IU] | Freq: Two times a day (BID) | SUBCUTANEOUS | Status: DC

## 2012-04-02 NOTE — Progress Notes (Signed)
Patient ID: [name] [name], female   DOB: [birth year], 61 y.o.   MRN: [id number] Reviewed and agree with Dr. Macky Lower documentation and management.

## 2012-04-02 NOTE — Progress Notes (Signed)
Patient arrives in good spirits.   However, she notes that she has run into the donut hole and is struggling to afford her medications.   She reports her average CBGs are 275-285 with a few 300s Her lowest CBG was 225 an her highest was 350s.  She has avoided taking Novolog when she didn't eat a large meal.   Patient reports nocturia of 3-5 times per night and is currently being treated for a UTI.   She is on her last day of ciprofloxacin.   She continues to smoke 4-5 cigarettes per day.   She is NOT interested in quitting at this time due to her fear of experiencing a severe panic attack with w/d.   O: Samples of novolog and lantus pens provided.   A/P:   Diabetes of many yrs duration currently under  fair but improved control of blood glucose based on   Lab Results  Component Value Date   HGBA1C 10.3 11/04/2011    ,home fasting CBG readings of < 300 routinely. Control is suboptimal due to sedentary lifestyle, and avoidance of use of novolog. Denies hypoglycemic events.  Able to verbalize appropriate hypoglycemia management plan. Continued basal insulin Lantus (insulin glargine) to previously prescribed dose of 34 units (patient was only using 30 units).  Continued rapid insulin Novolog (insulin aspart) at the same dose of 15 units prior to meals and encouraged her to take these doses with food.   Written patient instructions provided.  Follow up in  Pharmacist Clinic Visit in January.   Total time in face to face counseling 35 minutes.  Patient seen with Juanita Craver, PharmD Candidate.

## 2012-04-02 NOTE — Assessment & Plan Note (Signed)
Diabetes of many yrs duration currently under  fair but improved control of blood glucose based on   Lab Results  Component Value Date   HGBA1C 10.3 11/04/2011    ,home fasting CBG readings of < 300 routinely. Control is suboptimal due to sedentary lifestyle, and avoidance of use of novolog. Denies hypoglycemic events.  Able to verbalize appropriate hypoglycemia management plan. Continued basal insulin Lantus (insulin glargine) to previously prescribed dose of 34 units (patient was only using 30 units).  Continued rapid insulin Novolog (insulin aspart) at the same dose of 15 units prior to meals and encouraged her to take these doses with food.   Written patient instructions provided.  Follow up in  Pharmacist Clinic Visit in January.   Total time in face to face counseling 35 minutes.  Patient seen with Juanita Craver, PharmD Candidate.

## 2012-04-02 NOTE — Patient Instructions (Addendum)
Increase your Lantus dose to 34 units twice a day  Eat large enough meals so that you can take your Novolog.   Try to not smoke when you have your next panic attack. You can do it!!

## 2012-04-15 ENCOUNTER — Encounter (HOSPITAL_COMMUNITY): Payer: Self-pay | Admitting: *Deleted

## 2012-04-15 ENCOUNTER — Emergency Department (HOSPITAL_COMMUNITY): Payer: Medicare Other

## 2012-04-15 ENCOUNTER — Inpatient Hospital Stay (HOSPITAL_COMMUNITY)
Admission: EM | Admit: 2012-04-15 | Discharge: 2012-04-18 | DRG: 439 | Disposition: A | Discharge status: Home / Self Care | Payer: Medicare Other | Attending: Family Medicine | Admitting: Family Medicine

## 2012-04-15 DIAGNOSIS — K219 Gastro-esophageal reflux disease without esophagitis: Secondary | ICD-10-CM | POA: Diagnosis present

## 2012-04-15 DIAGNOSIS — I10 Essential (primary) hypertension: Secondary | ICD-10-CM | POA: Diagnosis present

## 2012-04-15 DIAGNOSIS — Z716 Tobacco abuse counseling: Secondary | ICD-10-CM

## 2012-04-15 DIAGNOSIS — K859 Acute pancreatitis without necrosis or infection, unspecified: Secondary | ICD-10-CM

## 2012-04-15 DIAGNOSIS — F3289 Other specified depressive episodes: Secondary | ICD-10-CM | POA: Diagnosis present

## 2012-04-15 DIAGNOSIS — J45909 Unspecified asthma, uncomplicated: Secondary | ICD-10-CM | POA: Diagnosis present

## 2012-04-15 DIAGNOSIS — M129 Arthropathy, unspecified: Secondary | ICD-10-CM | POA: Diagnosis present

## 2012-04-15 DIAGNOSIS — F411 Generalized anxiety disorder: Secondary | ICD-10-CM | POA: Diagnosis present

## 2012-04-15 DIAGNOSIS — Z794 Long term (current) use of insulin: Secondary | ICD-10-CM

## 2012-04-15 DIAGNOSIS — Z7982 Long term (current) use of aspirin: Secondary | ICD-10-CM

## 2012-04-15 DIAGNOSIS — H269 Unspecified cataract: Secondary | ICD-10-CM | POA: Diagnosis present

## 2012-04-15 DIAGNOSIS — IMO0002 Reserved for concepts with insufficient information to code with codable children: Secondary | ICD-10-CM | POA: Diagnosis present

## 2012-04-15 DIAGNOSIS — F329 Major depressive disorder, single episode, unspecified: Secondary | ICD-10-CM | POA: Diagnosis present

## 2012-04-15 DIAGNOSIS — Z79899 Other long term (current) drug therapy: Secondary | ICD-10-CM

## 2012-04-15 DIAGNOSIS — B370 Candidal stomatitis: Secondary | ICD-10-CM | POA: Diagnosis present

## 2012-04-15 DIAGNOSIS — E1165 Type 2 diabetes mellitus with hyperglycemia: Secondary | ICD-10-CM | POA: Diagnosis present

## 2012-04-15 DIAGNOSIS — F17200 Nicotine dependence, unspecified, uncomplicated: Secondary | ICD-10-CM | POA: Diagnosis present

## 2012-04-15 DIAGNOSIS — R1013 Epigastric pain: Secondary | ICD-10-CM | POA: Diagnosis present

## 2012-04-15 DIAGNOSIS — E119 Type 2 diabetes mellitus without complications: Secondary | ICD-10-CM

## 2012-04-15 DIAGNOSIS — G894 Chronic pain syndrome: Secondary | ICD-10-CM | POA: Diagnosis present

## 2012-04-15 LAB — CBC WITH DIFFERENTIAL/PLATELET
Basophils Absolute: 0 10*3/uL (ref 0.0–0.1)
Basophils Relative: 1 % (ref 0–1)
Eosinophils Absolute: 0.1 10*3/uL (ref 0.0–0.7)
Eosinophils Relative: 1 % (ref 0–5)
HCT: 41.4 % (ref 36.0–46.0)
Hemoglobin: 13.3 g/dL (ref 12.0–15.0)
Lymphocytes Relative: 35 % (ref 12–46)
Lymphs Abs: 2.6 10*3/uL (ref 0.7–4.0)
MCH: 27.3 pg (ref 26.0–34.0)
MCHC: 32.1 g/dL (ref 30.0–36.0)
MCV: 85 fL (ref 78.0–100.0)
Monocytes Absolute: 0.4 10*3/uL (ref 0.1–1.0)
Monocytes Relative: 5 % (ref 3–12)
Neutro Abs: 4.3 10*3/uL (ref 1.7–7.7)
Neutrophils Relative %: 58 % (ref 43–77)
Platelets: 160 10*3/uL (ref 150–400)
RBC: 4.87 MIL/uL (ref 3.87–5.11)
RDW: 14 % (ref 11.5–15.5)
WBC: 7.4 10*3/uL (ref 4.0–10.5)

## 2012-04-15 LAB — COMPREHENSIVE METABOLIC PANEL
ALT: 14 U/L (ref 0–35)
AST: 19 U/L (ref 0–37)
Albumin: 3.9 g/dL (ref 3.5–5.2)
Alkaline Phosphatase: 105 U/L (ref 39–117)
BUN: 7 mg/dL (ref 6–23)
CO2: 27 mEq/L (ref 19–32)
Calcium: 9.2 mg/dL (ref 8.4–10.5)
Chloride: 104 mEq/L (ref 96–112)
Creatinine, Ser: 0.64 mg/dL (ref 0.50–1.10)
GFR calc Af Amer: >90 mL/min (ref >90)
GFR calc non Af Amer: >90 mL/min (ref >90)
Glucose, Bld: 188 mg/dL — ABNORMAL HIGH (ref 70–99)
Potassium: 3.4 mEq/L — ABNORMAL LOW (ref 3.5–5.1)
Sodium: 142 mEq/L (ref 135–145)
Total Bilirubin: 0.3 mg/dL (ref 0.3–1.2)
Total Protein: 7.9 g/dL (ref 6.0–8.3)

## 2012-04-15 LAB — LIPASE, BLOOD: Lipase: 196 U/L — ABNORMAL HIGH (ref 11–59)

## 2012-04-15 MED ORDER — SODIUM CHLORIDE 0.9 % IV BOLUS (SEPSIS)
1000.0000 mL | Freq: Once | INTRAVENOUS | Status: AC
  Administered 2012-04-16: 1000 mL via INTRAVENOUS

## 2012-04-15 MED ORDER — SODIUM CHLORIDE 0.9 % IV SOLN
INTRAVENOUS | Status: DC
  Administered 2012-04-16: 03:00:00 via INTRAVENOUS

## 2012-04-15 MED ORDER — HYDROMORPHONE HCL PF 1 MG/ML IJ SOLN
1.0000 mg | Freq: Once | INTRAMUSCULAR | Status: AC
  Administered 2012-04-16: 1 mg via INTRAVENOUS
  Filled 2012-04-15 (×2): qty 1

## 2012-04-15 MED ORDER — ONDANSETRON HCL 4 MG/2ML IJ SOLN
4.0000 mg | Freq: Once | INTRAMUSCULAR | Status: AC
  Administered 2012-04-16: 4 mg via INTRAVENOUS
  Filled 2012-04-15 (×2): qty 2

## 2012-04-15 NOTE — ED Notes (Signed)
The pt is c/o pain all over her body since dec 17th.  C/o nv with diarrhea
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
## 2012-04-16 ENCOUNTER — Emergency Department (HOSPITAL_COMMUNITY): Payer: Medicare Other

## 2012-04-16 DIAGNOSIS — K859 Acute pancreatitis without necrosis or infection, unspecified: Principal | ICD-10-CM | POA: Diagnosis present

## 2012-04-16 LAB — CBC
HCT: 36.9 % (ref 36.0–46.0)
Hemoglobin: 11.9 g/dL — ABNORMAL LOW (ref 12.0–15.0)
MCH: 27.8 pg (ref 26.0–34.0)
MCHC: 32.2 g/dL (ref 30.0–36.0)
MCV: 86.2 fL (ref 78.0–100.0)
Platelets: 148 10*3/uL — ABNORMAL LOW (ref 150–400)
RBC: 4.28 MIL/uL (ref 3.87–5.11)
RDW: 14.5 % (ref 11.5–15.5)
WBC: 6 10*3/uL (ref 4.0–10.5)

## 2012-04-16 LAB — URINALYSIS, ROUTINE W REFLEX MICROSCOPIC
Bilirubin Urine: NEGATIVE
Glucose, UA: NEGATIVE mg/dL
Hgb urine dipstick: NEGATIVE
Ketones, ur: NEGATIVE mg/dL
Leukocytes, UA: NEGATIVE
Nitrite: POSITIVE — AB
Protein, ur: NEGATIVE mg/dL
Specific Gravity, Urine: 1.014 (ref 1.005–1.030)
Urobilinogen, UA: 1 mg/dL (ref 0.0–1.0)
pH: 7 (ref 5.0–8.0)

## 2012-04-16 LAB — CREATININE, SERUM
Creatinine, Ser: 0.65 mg/dL (ref 0.50–1.10)
GFR calc Af Amer: >90 mL/min (ref >90)
GFR calc non Af Amer: >90 mL/min (ref >90)

## 2012-04-16 LAB — GLUCOSE, CAPILLARY
Glucose-Capillary: 145 mg/dL — ABNORMAL HIGH (ref 70–99)
Glucose-Capillary: 147 mg/dL — ABNORMAL HIGH (ref 70–99)
Glucose-Capillary: 154 mg/dL — ABNORMAL HIGH (ref 70–99)
Glucose-Capillary: 168 mg/dL — ABNORMAL HIGH (ref 70–99)
Glucose-Capillary: 177 mg/dL — ABNORMAL HIGH (ref 70–99)
Glucose-Capillary: 204 mg/dL — ABNORMAL HIGH (ref 70–99)

## 2012-04-16 LAB — URINE MICROSCOPIC-ADD ON

## 2012-04-16 MED ORDER — INSULIN GLARGINE 100 UNIT/ML ~~LOC~~ SOLN
20.0000 [IU] | Freq: Every day | SUBCUTANEOUS | Status: DC
  Administered 2012-04-16 – 2012-04-17 (×2): 20 [IU] via SUBCUTANEOUS

## 2012-04-16 MED ORDER — PANTOPRAZOLE SODIUM 40 MG PO TBEC
40.0000 mg | DELAYED_RELEASE_TABLET | Freq: Every day | ORAL | Status: DC
  Administered 2012-04-16 – 2012-04-18 (×3): 40 mg via ORAL
  Filled 2012-04-16 (×3): qty 1

## 2012-04-16 MED ORDER — PNEUMOCOCCAL VAC POLYVALENT 25 MCG/0.5ML IJ INJ
0.5000 mL | INJECTION | INTRAMUSCULAR | Status: AC
  Administered 2012-04-17: 0.5 mL via INTRAMUSCULAR
  Filled 2012-04-16: qty 0.5

## 2012-04-16 MED ORDER — MONTELUKAST SODIUM 10 MG PO TABS
10.0000 mg | ORAL_TABLET | Freq: Every day | ORAL | Status: DC
  Administered 2012-04-16 – 2012-04-17 (×2): 10 mg via ORAL
  Filled 2012-04-16 (×4): qty 1

## 2012-04-16 MED ORDER — ATORVASTATIN CALCIUM 20 MG PO TABS
20.0000 mg | ORAL_TABLET | Freq: Every day | ORAL | Status: DC
  Administered 2012-04-16 – 2012-04-17 (×2): 20 mg via ORAL
  Filled 2012-04-16 (×3): qty 1

## 2012-04-16 MED ORDER — FLUTICASONE PROPIONATE 50 MCG/ACT NA SUSP
2.0000 | Freq: Every day | NASAL | Status: DC
  Administered 2012-04-16 – 2012-04-18 (×3): 2 via NASAL
  Filled 2012-04-16: qty 16

## 2012-04-16 MED ORDER — KETOROLAC TROMETHAMINE 0.5 % OP SOLN
1.0000 [drp] | Freq: Four times a day (QID) | OPHTHALMIC | Status: DC | PRN
  Administered 2012-04-16: 1 [drp] via OPHTHALMIC
  Filled 2012-04-16: qty 3

## 2012-04-16 MED ORDER — LORATADINE 10 MG PO TABS
10.0000 mg | ORAL_TABLET | Freq: Every day | ORAL | Status: DC
  Administered 2012-04-16 – 2012-04-17 (×2): 10 mg via ORAL
  Filled 2012-04-16 (×3): qty 1

## 2012-04-16 MED ORDER — ARTIFICIAL TEARS OP OINT
TOPICAL_OINTMENT | OPHTHALMIC | Status: DC | PRN
  Administered 2012-04-16: 13:00:00 via OPHTHALMIC
  Filled 2012-04-16: qty 3.5

## 2012-04-16 MED ORDER — PROMETHAZINE HCL 25 MG/ML IJ SOLN
12.5000 mg | Freq: Four times a day (QID) | INTRAMUSCULAR | Status: DC | PRN
  Administered 2012-04-16 (×2): 25 mg via INTRAVENOUS
  Filled 2012-04-16 (×2): qty 1

## 2012-04-16 MED ORDER — MORPHINE SULFATE 4 MG/ML IJ SOLN
4.0000 mg | INTRAMUSCULAR | Status: DC | PRN
  Administered 2012-04-16 – 2012-04-17 (×8): 4 mg via INTRAVENOUS
  Filled 2012-04-16 (×9): qty 1

## 2012-04-16 MED ORDER — QUETIAPINE FUMARATE 100 MG PO TABS
100.0000 mg | ORAL_TABLET | Freq: Every day | ORAL | Status: DC
Start: 2012-04-16 — End: 2012-04-18
  Administered 2012-04-16 – 2012-04-17 (×2): 100 mg via ORAL
  Filled 2012-04-16 (×3): qty 1

## 2012-04-16 MED ORDER — METOPROLOL TARTRATE 100 MG PO TABS
200.0000 mg | ORAL_TABLET | Freq: Two times a day (BID) | ORAL | Status: DC
  Administered 2012-04-16 – 2012-04-17 (×4): 200 mg via ORAL
  Filled 2012-04-16 (×6): qty 2

## 2012-04-16 MED ORDER — PREGABALIN 75 MG PO CAPS
150.0000 mg | ORAL_CAPSULE | Freq: Two times a day (BID) | ORAL | Status: DC
  Administered 2012-04-16 – 2012-04-18 (×5): 150 mg via ORAL
  Filled 2012-04-16: qty 2
  Filled 2012-04-16 (×2): qty 6
  Filled 2012-04-16: qty 3
  Filled 2012-04-16: qty 6
  Filled 2012-04-16: qty 3

## 2012-04-16 MED ORDER — CYCLOBENZAPRINE HCL 10 MG PO TABS: 10.0000 mg | ORAL_TABLET | Freq: Three times a day (TID) | ORAL | Status: DC | PRN

## 2012-04-16 MED ORDER — OLOPATADINE HCL 0.1 % OP SOLN
1.0000 [drp] | Freq: Every day | OPHTHALMIC | Status: DC
  Administered 2012-04-16 – 2012-04-18 (×3): 1 [drp] via OPHTHALMIC
  Filled 2012-04-16: qty 5

## 2012-04-16 MED ORDER — ALBUTEROL SULFATE HFA 108 (90 BASE) MCG/ACT IN AERS: 2.0000 | INHALATION_SPRAY | Freq: Four times a day (QID) | RESPIRATORY_TRACT | Status: DC | PRN

## 2012-04-16 MED ORDER — LISINOPRIL 40 MG PO TABS
40.0000 mg | ORAL_TABLET | Freq: Every day | ORAL | Status: DC
  Administered 2012-04-16 – 2012-04-18 (×3): 40 mg via ORAL
  Filled 2012-04-16 (×3): qty 1

## 2012-04-16 MED ORDER — PENTOSAN POLYSULFATE SODIUM 100 MG PO CAPS
100.0000 mg | ORAL_CAPSULE | Freq: Three times a day (TID) | ORAL | Status: DC
  Administered 2012-04-16 – 2012-04-18 (×8): 100 mg via ORAL
  Filled 2012-04-16 (×10): qty 1

## 2012-04-16 MED ORDER — METOCLOPRAMIDE HCL 10 MG PO TABS
10.0000 mg | ORAL_TABLET | Freq: Three times a day (TID) | ORAL | Status: DC
  Administered 2012-04-16 – 2012-04-18 (×8): 10 mg via ORAL
  Filled 2012-04-16 (×10): qty 1

## 2012-04-16 MED ORDER — NYSTATIN 100000 UNIT/ML MT SUSP
500000.0000 [IU] | Freq: Two times a day (BID) | OROMUCOSAL | Status: DC
  Administered 2012-04-16 – 2012-04-18 (×5): 500000 [IU] via ORAL
  Filled 2012-04-16 (×6): qty 5

## 2012-04-16 MED ORDER — BEPOTASTINE BESILATE 1.5 % OP SOLN: 1.0000 [drp] | Freq: Every day | OPHTHALMIC | Status: DC

## 2012-04-16 MED ORDER — INFLUENZA VIRUS VACC SPLIT PF IM SUSP
0.5000 mL | INTRAMUSCULAR | Status: AC
  Administered 2012-04-17: 0.5 mL via INTRAMUSCULAR
  Filled 2012-04-16: qty 0.5

## 2012-04-16 MED ORDER — HEPARIN SODIUM (PORCINE) 5000 UNIT/ML IJ SOLN
5000.0000 [IU] | Freq: Three times a day (TID) | INTRAMUSCULAR | Status: DC
  Administered 2012-04-16 – 2012-04-18 (×7): 5000 [IU] via SUBCUTANEOUS
  Filled 2012-04-16 (×11): qty 1

## 2012-04-16 MED ORDER — PREDNISOLONE ACETATE 1 % OP SUSP
1.0000 [drp] | Freq: Two times a day (BID) | OPHTHALMIC | Status: DC
  Administered 2012-04-16 – 2012-04-18 (×5): 1 [drp] via OPHTHALMIC
  Filled 2012-04-16 (×2): qty 1

## 2012-04-16 MED ORDER — KCL IN DEXTROSE-NACL 20-5-0.45 MEQ/L-%-% IV SOLN
INTRAVENOUS | Status: DC
  Administered 2012-04-16 – 2012-04-17 (×4): via INTRAVENOUS
  Filled 2012-04-16 (×9): qty 1000

## 2012-04-16 MED ORDER — LEVOCETIRIZINE DIHYDROCHLORIDE 5 MG PO TABS: 5.0000 mg | ORAL_TABLET | Freq: Every evening | ORAL | Status: DC

## 2012-04-16 MED ORDER — ONDANSETRON HCL 4 MG/2ML IJ SOLN
4.0000 mg | Freq: Four times a day (QID) | INTRAMUSCULAR | Status: DC | PRN
  Administered 2012-04-17: 4 mg via INTRAVENOUS
  Filled 2012-04-16: qty 2

## 2012-04-16 MED ORDER — INSULIN ASPART 100 UNIT/ML ~~LOC~~ SOLN
0.0000 [IU] | SUBCUTANEOUS | Status: DC
Start: 2012-04-16 — End: 2012-04-18
  Administered 2012-04-16 (×2): 3 [IU] via SUBCUTANEOUS
  Administered 2012-04-16: 2 [IU] via SUBCUTANEOUS
  Administered 2012-04-16: 5 [IU] via SUBCUTANEOUS
  Administered 2012-04-16: 2 [IU] via SUBCUTANEOUS
  Administered 2012-04-16: 3 [IU] via SUBCUTANEOUS
  Administered 2012-04-17: 5 [IU] via SUBCUTANEOUS
  Administered 2012-04-17: 3 [IU] via SUBCUTANEOUS
  Administered 2012-04-17: 5 [IU] via SUBCUTANEOUS
  Administered 2012-04-17 (×2): 3 [IU] via SUBCUTANEOUS
  Administered 2012-04-18 (×3): 5 [IU] via SUBCUTANEOUS
  Administered 2012-04-18: 3 [IU] via SUBCUTANEOUS

## 2012-04-16 MED ORDER — ASPIRIN EC 81 MG PO TBEC
81.0000 mg | DELAYED_RELEASE_TABLET | Freq: Every day | ORAL | Status: DC
  Administered 2012-04-16 – 2012-04-18 (×3): 81 mg via ORAL
  Filled 2012-04-16 (×3): qty 1

## 2012-04-16 NOTE — H&P (Signed)
I have seen and examined this patient. I have discussed with Dr Mikel Cella.  I agree with their findings and plans as documented in their admission note.  Acute Issues 1. Acute pancreatitis - CT abdomen: unremarkable -  No alcohol intake, No gallbladder. - Triglycerides 307 11/2011)  _Recommendations: - NPO except ice chips - Opiate analgesis, Antiemetics - IVF at maintenance

## 2012-04-16 NOTE — ED Notes (Signed)
Pt given ice chips. Pt reminded of the need for a urine sample.

## 2012-04-16 NOTE — Progress Notes (Signed)
PGY 1 Update: Pt seen and examined at bedside.  Continues to have abdominal pain that has not improved since admission and now having back pain.  Does admit to some dysuria and increased frequency but no groin pain  Exam: +TTP epigastric, RUQ, LUQ.  No CVA tenderness  A/P: Acute Pancreatitis - Lipase trended down from 200 to 19.  Continue NPO with ice chips, advance diet as tolerated.  D5 1/2 NS +62meq KCl @ 100 cc/hr.  Continue Morphine PRN for pain and wean as advance diet.  Reglan for nausea.    Twana First Paulina Fusi, DO of [facility] 04/16/2012, 10:11 AM
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
## 2012-04-16 NOTE — ED Notes (Signed)
Patient transported to X-ray 

## 2012-04-16 NOTE — ED Notes (Signed)
Patient states that she is still unable to use the restroom at this time

## 2012-04-16 NOTE — Discharge Summary (Signed)
Physician Discharge Summary  Patient ID: [name] [name] MRN: [id number] DOB: 1950/05/20 Age: 61 y.o.  Admit date: 04/15/2012 Discharge date: 04/16/2012 Admitting Physician: Leighton Roach McDiarmid, MD  PCP: Despina Hick, MD  Consultants:None      Discharge Diagnosis: Principal Problem:  *Acute pancreatitis Active Problems:  Diabetes mellitus out of control  Tobacco abuse counseling  Chronic pain syndrome  HYPERTENSION, BENIGN ESSENTIAL  GASTROESOPHAGEAL REFLUX DISEASE, CHRONIC  Pancreatitis, recurrent  Oral thrush  Epigastric abdominal pain    Hospital Course [name] [name] is 61 y.o. female with chronic pain syndrome, which presented in the ED for progressively worsening abdominal pain found to have mild pancreatitis.  1) Acute Pancreatitis - Pt presented to the ED with 6 day history of worsening abdominal pain with eventual non-bloody emesis.  Labs in the ED revealed a Lipase to 196 but otherwise grossly non-remarkable including LFT which were not elevated.  She was started on IVF, given one dose of Dilaudid for pain, and Zofran for nausea.  Pt was transferred to the floor where she was made NPO except for ice chips, continued on D5 1/2 NS and switched to morphine PRN q 2 hrs for pain and her home lyrica.  A repeat lipase was done which showed that the lipase had trended down to 50 and she was continued on the Zofran for nausea. Pain regimen was changed to PO oxycodone and pain improved as well as nausea and Po intake. Pt was discharge home with resolution of symptoms that prompted admission.   2. DM II, Uncontrolled - Pt last A1C in July was 10.3  She states she is compliant with her lantus and novolog, despite previous notes saying the medications are too expensive.  An A1C was checked was slightly improved. She was placed on SSI and at a lower dose of her home Lantus (20 U). Sugars continued to be high and Lantus was increased to 30 units and SSI. Pt was instructed pt to resume home  regimen at discharge.  3. HTN - Stable, continued home meds   4. Chronic Pain Syndrome - Stable, continued home meds. Percocet  2.5/325mg  was prescribed at the time of discharge (20 tabs)   Procedures/Imaging:  Dg Chest 2 View  04/15/2012   IMPRESSION:  1.  Mild central peribronchial thickening suggesting bronchitis, asthma, or viral syndrome.   Original Report Authenticated By: D. Andria Rhein, MD    Dg Abd 1 View  04/16/2012    IMPRESSION: Unremarkable bowel gas pattern; no free intra-abdominal air seen.   Original Report Authenticated By: Tonia Ghent, M.D.    Labs  CBC  Lab 04/16/12 0910 04/15/12 2005  WBC 6.0 7.4  HGB 11.9* 13.3  HCT 36.9 41.4  PLT 148* 160   BMET  Lab 04/16/12 0910 04/15/12 2005  NA -- 142  K -- 3.4*  CL -- 104  CO2 -- 27  BUN -- 7  CREATININE 0.65 0.64  CALCIUM -- 9.2  PROT -- 7.9  BILITOT -- 0.3  ALKPHOS -- 105  ALT -- 14  AST -- 19  GLUCOSE -- 188*    Patient condition at time of discharge/disposition: Pt was discharge home on stable medical condition.   Follow up issues: 1. Uncontrolled DM 2. Chronic Pancreatitis  Discharge follow up:   Discharge Orders    Future Appointments: Provider: Department: Dept Phone: Center:   05/07/2012 11:15 AM Kathrin Ruddy, [facility] [phone] [facility]   05/07/2012 1:45 PM Phebe Colla, MD  [facility] [phone] [facility]     Discharge Instructions: Please refer to Patient Instructions section of EMR for full details.  Patient was counseled important signs and symptoms that should prompt return to medical care, changes in medications, dietary instructions, activity restrictions, and follow up appointments.  Significant instructions noted below:  Discharge Medications   Medication List     As of 04/18/2012  3:48 PM    START taking these medications         oxycodone-acetaminophen 2.5-325 MG per tablet   Commonly known as: PERCOCET   Take 1  tablet by mouth every 4 (four) hours as needed for pain.      CONTINUE taking these medications         albuterol 108 (90 BASE) MCG/ACT inhaler   Commonly known as: PROVENTIL HFA;VENTOLIN HFA      ARTIFICIAL TEARS OP      aspirin EC 81 MG tablet      atorvastatin 20 MG tablet   Commonly known as: LIPITOR   Take 1 tablet (20 mg total) by mouth daily.      BEPREVE 1.5 % Soln   Generic drug: Bepotastine Besilate      cyclobenzaprine 10 MG tablet   Commonly known as: FLEXERIL   Take 1 tablet (10 mg total) by mouth 3 (three) times daily as needed. For muscle spasms      diphenhydrAMINE 25 mg capsule   Commonly known as: BENADRYL      docusate sodium 100 MG capsule   Commonly known as: COLACE      esomeprazole 40 MG capsule   Commonly known as: NEXIUM   Take 1 capsule (40 mg total) by mouth daily before breakfast.      EXCEDRIN MIGRAINE PO      fluticasone 50 MCG/ACT nasal spray   Commonly known as: FLONASE      ibuprofen 200 MG tablet   Commonly known as: ADVIL,MOTRIN      insulin aspart 100 UNIT/ML injection   Commonly known as: novoLOG   Inject 15 Units into the skin 3 (three) times daily before meals.      insulin glargine 100 UNIT/ML injection   Commonly known as: LANTUS   Inject 34 Units into the skin 2 (two) times daily.      ketorolac 0.5 % ophthalmic solution   Commonly known as: ACULAR      levocetirizine 5 MG tablet   Commonly known as: XYZAL   Take 1 tablet (5 mg total) by mouth every evening.      lisinopril 40 MG tablet   Commonly known as: PRINIVIL,ZESTRIL      metoCLOPramide 10 MG tablet   Commonly known as: REGLAN   Take 1 tablet (10 mg total) by mouth 3 (three) times daily before meals.      metoprolol 100 MG tablet   Commonly known as: LOPRESSOR   Take 2 tablets (200 mg total) by mouth 2 (two) times daily.      montelukast 10 MG tablet   Commonly known as: SINGULAIR   Take 1 tablet (10 mg total) by mouth at bedtime.      nicotine  polacrilex 2 MG gum   Commonly known as: NICORETTE      nystatin 100000 UNIT/ML suspension   Commonly known as: MYCOSTATIN      nystatin cream   Commonly known as: MYCOSTATIN      ondansetron 4 MG tablet   Commonly known as: ZOFRAN  pentosan polysulfate 100 MG capsule   Commonly known as: ELMIRON   Take 1 capsule (100 mg total) by mouth 3 (three) times daily before meals.      phenazopyridine 200 MG tablet   Commonly known as: PYRIDIUM      polyethylene glycol packet   Commonly known as: MIRALAX / GLYCOLAX      prednisoLONE acetate 1 % ophthalmic suspension   Commonly known as: PRED FORTE      pregabalin 150 MG capsule   Commonly known as: LYRICA   Take 1 capsule (150 mg total) by mouth 2 (two) times daily.      QUEtiapine 200 MG tablet   Commonly known as: SEROQUEL      ranitidine 150 MG tablet   Commonly known as: ZANTAC      STOP taking these medications         acetaminophen 500 MG tablet   Commonly known as: TYLENOL          Where to get your medications    These are the prescriptions that you need to pick up.   You may get these medications from any pharmacy.         oxycodone-acetaminophen 2.5-325 MG per tablet            [name] Spizzirri Piloto Rolene Arbour, MD. PGY-2  04/18/2012 3:49 PM

## 2012-04-17 LAB — CBC
HCT: 36.7 % (ref 36.0–46.0)
Hemoglobin: 11.5 g/dL — ABNORMAL LOW (ref 12.0–15.0)
MCH: 27.6 pg (ref 26.0–34.0)
MCHC: 31.3 g/dL (ref 30.0–36.0)
MCV: 88 fL (ref 78.0–100.0)
Platelets: 142 10*3/uL — ABNORMAL LOW (ref 150–400)
RBC: 4.17 MIL/uL (ref 3.87–5.11)
RDW: 14.7 % (ref 11.5–15.5)
WBC: 6 10*3/uL (ref 4.0–10.5)

## 2012-04-17 LAB — GLUCOSE, CAPILLARY
Glucose-Capillary: 165 mg/dL — ABNORMAL HIGH (ref 70–99)
Glucose-Capillary: 181 mg/dL — ABNORMAL HIGH (ref 70–99)
Glucose-Capillary: 178 mg/dL — ABNORMAL HIGH (ref 70–99)
Glucose-Capillary: 250 mg/dL — ABNORMAL HIGH (ref 70–99)
Glucose-Capillary: 232 mg/dL — ABNORMAL HIGH (ref 70–99)

## 2012-04-17 LAB — COMPREHENSIVE METABOLIC PANEL
ALT: 25 U/L (ref 0–35)
AST: 35 U/L (ref 0–37)
Albumin: 3.3 g/dL — ABNORMAL LOW (ref 3.5–5.2)
Alkaline Phosphatase: 116 U/L (ref 39–117)
BUN: 6 mg/dL (ref 6–23)
CO2: 26 mEq/L (ref 19–32)
Calcium: 8.4 mg/dL (ref 8.4–10.5)
Chloride: 105 mEq/L (ref 96–112)
Creatinine, Ser: 0.7 mg/dL (ref 0.50–1.10)
GFR calc Af Amer: >90 mL/min (ref >90)
GFR calc non Af Amer: >90 mL/min (ref >90)
Glucose, Bld: 177 mg/dL — ABNORMAL HIGH (ref 70–99)
Potassium: 3.8 mEq/L (ref 3.5–5.1)
Sodium: 139 mEq/L (ref 135–145)
Total Bilirubin: 0.3 mg/dL (ref 0.3–1.2)
Total Protein: 7.1 g/dL (ref 6.0–8.3)

## 2012-04-17 LAB — URINE CULTURE

## 2012-04-17 LAB — HEMOGLOBIN A1C
Hgb A1c MFr Bld: 9.5 % — ABNORMAL HIGH (ref <5.7)
Mean Plasma Glucose: 226 mg/dL — ABNORMAL HIGH (ref <117)

## 2012-04-17 LAB — PHOSPHORUS: Phosphorus: 3.3 mg/dL (ref 2.3–4.6)

## 2012-04-17 LAB — PROTIME-INR
INR: 1.04 (ref 0.00–1.49)
Prothrombin Time: 13.5 seconds (ref 11.6–15.2)

## 2012-04-17 LAB — MAGNESIUM: Magnesium: 1.7 mg/dL (ref 1.5–2.5)

## 2012-04-17 LAB — APTT: aPTT: 32 seconds (ref 24–37)

## 2012-04-17 LAB — LIPASE, BLOOD: Lipase: 50 U/L (ref 11–59)

## 2012-04-17 LAB — TRIGLYCERIDES: Triglycerides: 169 mg/dL — ABNORMAL HIGH (ref <150)

## 2012-04-17 MED ORDER — OXYCODONE HCL 5 MG PO TABS
5.0000 mg | ORAL_TABLET | ORAL | Status: DC | PRN
  Administered 2012-04-17 – 2012-04-18 (×4): 5 mg via ORAL
  Filled 2012-04-17 (×4): qty 1

## 2012-04-17 MED ORDER — ONDANSETRON HCL 4 MG PO TABS
4.0000 mg | ORAL_TABLET | Freq: Four times a day (QID) | ORAL | Status: DC
  Administered 2012-04-17 – 2012-04-18 (×4): 4 mg via ORAL
  Filled 2012-04-17 (×7): qty 1

## 2012-04-17 MED ORDER — DM-GUAIFENESIN ER 30-600 MG PO TB12
1.0000 | ORAL_TABLET | Freq: Two times a day (BID) | ORAL | Status: DC
  Administered 2012-04-17: 22:00:00 via ORAL
  Administered 2012-04-17 – 2012-04-18 (×2): 1 via ORAL
  Filled 2012-04-17 (×4): qty 1

## 2012-04-17 NOTE — Progress Notes (Signed)
Family Medicine Teaching Service Attending Note  I interviewed and examined patient [name] [name] and reviewed their tests and x-rays.  I discussed with Dr. Gwenlyn Saran and reviewed their note for today.  I agree with their assessment and plan.     Additionally  Eating full liquids without nausea or vomiting  Continue to increase diet and decrease analgesics

## 2012-04-17 NOTE — Progress Notes (Signed)
Family Medicine Teaching Service Daily Progress Note: Subjective: Complains of congestion and cough. Also still having abdominal pain. She has been eating sips and chips. Requiring morphine 4mg  IV every 4 to 5 hours.   Objective: Vital signs in last 24 hours: Temp:  [98.4 F (36.9 C)-98.7 F (37.1 C)] 98.4 F (36.9 C) (12/28 0558) Pulse Rate:  [66-74] 67  (12/28 0558) Resp:  [18-20] 18  (12/28 0558) BP: (126-145)/(64-86) 126/75 mmHg (12/28 0558) SpO2:  [93 %-100 %] 96 % (12/28 0558) Weight:  [227 lb 1.2 oz (103 kg)] 227 lb 1.2 oz (103 kg) (12/28 0500) Weight change: 15 lb 1.2 oz (6.837 kg) Last BM Date:  (prior to admission)  Intake/Output from previous day: 12/27 0701 - 12/28 0700 In: 60 [P.O.:60] Out: 1 [Urine:1] Intake/Output this shift:    General appearance: alert, cooperative and no distress Cardio: regular rate and rhythm, S1, S2 normal, no murmur, click, rub or gallop GI: {soft, obese, present bowel sounds, tenderness mostly in left upper and mid quadrant as well as generally located Extremities: extremities normal, atraumatic, no cyanosis or edema Neurologic: Grossly normal  Lab Results:  Basename 04/16/12 0910 04/15/12 2005  WBC 6.0 7.4  HGB 11.9* 13.3  HCT 36.9 41.4  PLT 148* 160   BMET  Basename 04/16/12 0910 04/15/12 2005  NA -- 142  K -- 3.4*  CL -- 104  CO2 -- 27  GLUCOSE -- 188*  BUN -- 7  CREATININE 0.65 0.64  CALCIUM -- 9.2    Studies/Results: Dg Chest 2 View  04/15/2012  *RADIOLOGY REPORT*  Clinical Data: Abdominal pain, shortness of breath, fever  CHEST - 2 VIEW  Comparison: 12/28/2011  Findings: Mild dextroscoliosis of the thoracic spine.  Vascular clips in the right upper abdomen.  Mild central peribronchial thickening.  No confluent airspace infiltrate or overt edema.  No effusion.  Heart size upper limits normal.  Atheromatous aorta.  IMPRESSION:  1.  Mild central peribronchial thickening suggesting bronchitis, asthma, or viral syndrome.    Original Report Authenticated By: D. Andria Rhein, MD    Dg Abd 1 View  04/16/2012  *RADIOLOGY REPORT*  Clinical Data: Abdominal pain and nausea.  ABDOMEN - 1 VIEW  Comparison: CT of the abdomen and pelvis performed 10/28/2010  Findings: The visualized bowel gas pattern is unremarkable. Scattered air and stool filled loops of colon are seen; no abnormal dilatation of small bowel loops is seen to suggest small bowel obstruction.  No free intra-abdominal air is identified, though evaluation for free air is limited on a single supine view.  Clips are noted within the right upper quadrant, reflecting prior cholecystectomy.  Mild degenerative change is noted along the lower lumbar spine, and there is chronic deformity involving the left superior and inferior pubic rami; the sacroiliac joints are unremarkable in appearance. The visualized lung bases are essentially clear.  IMPRESSION: Unremarkable bowel gas pattern; no free intra-abdominal air seen.   Original Report Authenticated By: Tonia Ghent, M.D.     Medications: I have reviewed the patient's current medications.  Assessment/Plan: [name] [name] is 61 y.o. female with chronic pain syndrome, which presented in the ED this evening for progressively worsening abdominal pain found to have mild pancreatitis.   Pancreatitis: h/o pancreatitis in the past.   Lipase improved from 196 to 19.  - advance diet to full liquids and monitor  - switch from morphine to oxycodone 5mg  q3. - continue nausea control with zofran  Diabetes: Last A1C in July was 10.3.  Patient states she is taking her Lantus and Novolog, although based on Dr. Macky Lower outpatient notes she as not able to afford her medications.  - Check A1C  - CBG q4 while NPO with moderate SSI  - continue lantus 20units with sliding scale  Hypertension: stable on home meds   Cataracts: Patient is on multiple eye drops for eye pain and she has specifically requested to have these continued  -  Continue eye drops  FENGI:  - advance to full liquids - transition zofran to po PPx: PPI home dose, Heparin SQ  Code: Full Code  Dispo: Pending improvement of pain, tolerating PO food and medication. Patient will return home with family      LOS: 2 days   Kadar Chance 04/17/2012, 9:32 AM

## 2012-04-18 LAB — GLUCOSE, CAPILLARY
Glucose-Capillary: 153 mg/dL — ABNORMAL HIGH (ref 70–99)
Glucose-Capillary: 206 mg/dL — ABNORMAL HIGH (ref 70–99)
Glucose-Capillary: 225 mg/dL — ABNORMAL HIGH (ref 70–99)
Glucose-Capillary: 243 mg/dL — ABNORMAL HIGH (ref 70–99)

## 2012-04-18 LAB — CBC
HCT: 34.8 % — ABNORMAL LOW (ref 36.0–46.0)
Hemoglobin: 10.9 g/dL — ABNORMAL LOW (ref 12.0–15.0)
MCH: 27.7 pg (ref 26.0–34.0)
MCHC: 31.3 g/dL (ref 30.0–36.0)
MCV: 88.3 fL (ref 78.0–100.0)
Platelets: 134 10*3/uL — ABNORMAL LOW (ref 150–400)
RBC: 3.94 MIL/uL (ref 3.87–5.11)
RDW: 14.7 % (ref 11.5–15.5)
WBC: 5.1 10*3/uL (ref 4.0–10.5)

## 2012-04-18 LAB — COMPREHENSIVE METABOLIC PANEL
ALT: 24 U/L (ref 0–35)
AST: 34 U/L (ref 0–37)
Albumin: 3.1 g/dL — ABNORMAL LOW (ref 3.5–5.2)
Alkaline Phosphatase: 99 U/L (ref 39–117)
BUN: 4 mg/dL — ABNORMAL LOW (ref 6–23)
CO2: 20 mEq/L (ref 19–32)
Calcium: 8.1 mg/dL — ABNORMAL LOW (ref 8.4–10.5)
Chloride: 108 mEq/L (ref 96–112)
Creatinine, Ser: 0.68 mg/dL (ref 0.50–1.10)
GFR calc Af Amer: >90 mL/min (ref >90)
GFR calc non Af Amer: >90 mL/min (ref >90)
Glucose, Bld: 227 mg/dL — ABNORMAL HIGH (ref 70–99)
Potassium: 4.6 mEq/L (ref 3.5–5.1)
Sodium: 140 mEq/L (ref 135–145)
Total Bilirubin: 0.3 mg/dL (ref 0.3–1.2)
Total Protein: 6.7 g/dL (ref 6.0–8.3)

## 2012-04-18 LAB — HEMOGLOBIN A1C
Hgb A1c MFr Bld: 10.1 % — ABNORMAL HIGH (ref <5.7)
Mean Plasma Glucose: 243 mg/dL — ABNORMAL HIGH (ref <117)

## 2012-04-18 MED ORDER — OXYCODONE-ACETAMINOPHEN 2.5-325 MG PO TABS: 1.0000 | ORAL_TABLET | ORAL | Status: DC | PRN

## 2012-04-18 MED ORDER — INSULIN GLARGINE 100 UNIT/ML ~~LOC~~ SOLN
30.0000 [IU] | Freq: Every day | SUBCUTANEOUS | Status: DC
  Administered 2012-04-18: 30 [IU] via SUBCUTANEOUS

## 2012-04-18 NOTE — Progress Notes (Signed)
Patient discharged to home with family.  Discharge teaching completed including follow up care, medications and diet.  Verbalizes understanding with no further questions.  Vital signs stable, no complaints of pain, tolerating diet without complaints of nausea.  Discharged per wheelchair with family.

## 2012-04-18 NOTE — Progress Notes (Signed)
Daily Progress Note Family Medicine Teaching Service PGY-2   Patient name: [name] [name]  Medical record [id number] Date of birth:1951-02-01 Age: 61 y.o. Gender: female  LOS: 3 days   Subjective: Feeling better. Denies  Nausea or vomiting. Tolerated full breakfast this am.   Objective: Vitals: Filed Vitals:   04/18/12 1407  BP: 130/46  Pulse: 72  Temp: 98.4 F (36.9 C)  Resp: 20    Intake/Output Summary (Last 24 hours) at 04/18/12 1449 Last data filed at 04/17/12 1753  Gross per 24 hour  Intake    240 ml  Output      0 ml  Net    240 ml   Physical Exam: Gen:  NAD HEENT: Moist mucous membranes CV: Regular rate and rhythm, no murmurs rubs or gallops PULM: Clear to auscultation bilaterally. No wheezes/rales/rhonchi ABD: Soft, non tender, non distended, normal bowel sounds EXT: No edema Neuro: Alert and oriented x3. No focalization.  Labs and imaging:  CBC  Lab 04/18/12 0350 04/17/12 0915 04/16/12 0910  WBC 5.1 6.0 6.0  HGB 10.9* 11.5* 11.9*  HCT 34.8* 36.7 36.9  PLT 134* 142* 148*   BMET  Lab 04/18/12 0350 04/17/12 0915 04/16/12 0910 04/15/12 2005  NA 140 139 -- 142  K 4.6 3.8 -- 3.4*  CL 108 105 -- 104  CO2 20 26 -- 27  BUN 4* 6 -- 7  CREATININE 0.68 0.70 0.65 --  LABGLOM -- -- -- --  GLUCOSE 227* -- -- --  CALCIUM 8.1* 8.4 -- 9.2   Results for THOMASENIA, DOWSE (MRN [id number]) as of 04/18/2012 14:52  04/15/2012 20:05 04/17/2012 09:15  Lipase 196 (H) 50   CBG (last 3)   Basename 04/18/12 1207 04/18/12 0743 04/18/12 0440  GLUCAP 153* 206* 243*    Medications: Scheduled Meds:   . aspirin EC  81 mg Oral Daily  . atorvastatin  20 mg Oral q1800  . dextromethorphan-guaiFENesin  1 tablet Oral BID  . fluticasone  2 spray Each Nare Daily  . heparin  5,000 Units Subcutaneous Q8H  . insulin aspart  0-15 Units Subcutaneous Q4H  . insulin glargine  30 Units Subcutaneous Daily  . lisinopril  40 mg Oral Daily  . loratadine  10 mg Oral q1800  .  metoCLOPramide  10 mg Oral TID AC  . metoprolol  200 mg Oral BID  . montelukast  10 mg Oral QHS  . nystatin  500,000 Units Oral BID  . olopatadine  1 drop Both Eyes Daily  . ondansetron  4 mg Oral Q6H  . pantoprazole  40 mg Oral Daily  . pentosan polysulfate  100 mg Oral TID AC  . prednisoLONE acetate  1 drop Both Eyes BID  . pregabalin  150 mg Oral BID  . QUEtiapine  100 mg Oral QHS   Continuous Infusions:   . dextrose 5 % and 0.45 % NaCl with KCl 20 mEq/L 100 mL/hr at 04/17/12 1704   PRN Meds:.albuterol, artificial tears, cyclobenzaprine, ketorolac, oxyCODONE  Assessment and Plan: 61 y.o. F with chronic pain syndrome admitted for mild pancreatitis.   1. Pancreatitis: h/o pancreatitis in the past. Lipase improved to 50 (WNL) Tolerating PO and pain controlled on Po medication.  2. DMT2:  A1C improved from 10.3 to 9.5.  Pt reports compliance with Latus 60 units a day and Novolog.  - increased Lantus today to 30 units daily per CBG's and since pt likely to go home today, instructed to resume Lantus at home. (30 units  this PM)  3. Hypertension: Stable  - Continue home regimen.   FENGI: CHO modified diet PPx: PPI home dose, Heparin SQ  Code: Full Code  Dispo: discharge home today.  D. Piloto Rolene Arbour, MD PGY2, Family Medicine Teaching Service  04/18/2012

## 2012-04-19 NOTE — Discharge Summary (Signed)
I have reviewed this discharge summary and agree.    

## 2012-04-19 NOTE — Progress Notes (Signed)
Family Medicine Teaching Service Attending Note  On 12-29 I interviewed and examined patient [name] [name] and reviewed their tests and x-rays.  I discussed with Dr. Richardean Canal and reviewed their note for today.  I agree with their assessment and plan.     Additionally  Feeling better taking oral well Ok to discharge

## 2012-04-23 ENCOUNTER — Inpatient Hospital Stay: Payer: Medicare Other | Admitting: Family Medicine

## 2012-04-23 ENCOUNTER — Ambulatory Visit (INDEPENDENT_AMBULATORY_CARE_PROVIDER_SITE_OTHER): Payer: Medicare Other | Admitting: Pharmacist

## 2012-04-23 ENCOUNTER — Encounter: Payer: Self-pay | Admitting: Pharmacist

## 2012-04-23 VITALS — BP 182/89 | HR 76 | Temp 98.6°F | Ht 63.0 in | Wt 223.0 lb

## 2012-04-23 DIAGNOSIS — F17200 Nicotine dependence, unspecified, uncomplicated: Secondary | ICD-10-CM

## 2012-04-23 DIAGNOSIS — Z7189 Other specified counseling: Secondary | ICD-10-CM

## 2012-04-23 DIAGNOSIS — IMO0001 Reserved for inherently not codable concepts without codable children: Secondary | ICD-10-CM

## 2012-04-23 DIAGNOSIS — E1165 Type 2 diabetes mellitus with hyperglycemia: Secondary | ICD-10-CM

## 2012-04-23 DIAGNOSIS — Z716 Tobacco abuse counseling: Secondary | ICD-10-CM

## 2012-04-23 MED ORDER — INSULIN GLARGINE 100 UNIT/ML ~~LOC~~ SOLN: 34.0000 [IU] | Freq: Two times a day (BID) | SUBCUTANEOUS | Status: DC

## 2012-04-23 NOTE — Progress Notes (Signed)
  Subjective:    Patient ID: [name] [name], female    DOB: [birth year], 62 y.o.   MRN: [id number]  HPI Pt arrives to clinic using cane. She was recently admitted for pancreatitis and is complaining of continued stomach pain. She reports having a difficult time eating because of this. Reports using Novolog with meals only, usually twice/day. She reports using her Lantus at 30 units BID or 60 units daily.  She denies hypoglycemic events. She is having a difficult time obtaining her medicines due to being in the donut hole. She is in the process of trying to get Medicaid coverage as well.   Pt reports stopping smoking while admitted to hospital. Since being discharged, she has only had a few cigarettes.    Review of Systems     Objective:   Physical Exam  Samples of this drug were given to the patient, quantity 1, Lot Number [id number], Exp 4/16      Assessment & Plan:   Long-standing diabetes currently under poor control of blood glucose based on   Lab Results  Component Value Date   HGBA1C 10.1* 04/18/2012    ,home fasting CBG readings of 250-360 and random CBG readings of 183-331. Control is suboptimal due to suboptimal insulin dose in addition to patient being ill with erratic meals. Denies hypoglycemic events.  Able to verbalize appropriate hypoglycemia management plan. Increased dose of basal insulin Lantus (insulin glargine) to 34 units BID. Continued rapid insulin Novolog (insulin aspart) to 15 units with meals.  Written patient instructions provided.  Follow up with Dr. Ashley Royalty next week and Pharmacist Clinic to be determined after.   Total time in face to face counseling 30 minutes.  Patient seen with Doris Cheadle, PharmD.  Mild-moderate Nicotine Dependence of 30 years duration in a patient who is fair candidate for success b/c she recently quit for several days while admitted. She reports her confidence in being able to not smoke today to be a 5/10.

## 2012-04-23 NOTE — Patient Instructions (Addendum)
Increase your Lantus to 34 units twice a day. Continue using your Novolog with your meals.  Reschedule your follow up appointment with Dr. Ashley Royalty. Schedule visit with Dr. Raymondo Band later in the month after your visit with Dr. Ashley Royalty.

## 2012-04-23 NOTE — Assessment & Plan Note (Signed)
Long-standing diabetes currently under poor control of blood glucose based on   Lab Results  Component Value Date   HGBA1C 10.1* 04/18/2012    ,home fasting CBG readings of 250-360 and random CBG readings of 183-331. Control is suboptimal due to suboptimal insulin dose in addition to patient being ill with erratic meals. Denies hypoglycemic events.  Able to verbalize appropriate hypoglycemia management plan. Increased dose of basal insulin Lantus (insulin glargine) to 34 units BID. Continued rapid insulin Novolog (insulin aspart) to 15 units with meals.  Written patient instructions provided.  Follow up with Dr. Ashley Royalty next week and Pharmacist Clinic to be determined after.   Total time in face to face counseling 30 minutes.  Patient seen with [name] [name], PharmD.  Mild-moderate Nicotine Dependence of 30 years duration in a patient who is fair candidate for success b/c she recently quit for several days while admitted. She reports her confidence in being able to not smoke today to be a 5/10.

## 2012-04-23 NOTE — Assessment & Plan Note (Signed)
Mild-moderate Nicotine Dependence of 30 years duration in a patient who is fair candidate for success b/c she recently quit for several days while admitted. She reports her confidence in being able to not smoke today to be a 5/10.

## 2012-04-26 NOTE — Progress Notes (Signed)
Patient ID: [name] [name], female   DOB: 05/12/1950, 61 y.o.   MRN: [id number] Reviewed:  Agree with Dr Koval's documentation and management. 

## 2012-04-27 ENCOUNTER — Ambulatory Visit: Payer: Medicare Other | Admitting: Family Medicine

## 2012-04-30 ENCOUNTER — Ambulatory Visit (INDEPENDENT_AMBULATORY_CARE_PROVIDER_SITE_OTHER): Payer: Medicare Other | Admitting: Pharmacist

## 2012-04-30 ENCOUNTER — Encounter: Payer: Self-pay | Admitting: Pharmacist

## 2012-04-30 VITALS — BP 174/73 | HR 79 | Ht 64.0 in | Wt 226.0 lb

## 2012-04-30 DIAGNOSIS — F17200 Nicotine dependence, unspecified, uncomplicated: Secondary | ICD-10-CM

## 2012-04-30 DIAGNOSIS — IMO0001 Reserved for inherently not codable concepts without codable children: Secondary | ICD-10-CM

## 2012-04-30 DIAGNOSIS — Z7189 Other specified counseling: Secondary | ICD-10-CM

## 2012-04-30 DIAGNOSIS — Z716 Tobacco abuse counseling: Secondary | ICD-10-CM

## 2012-04-30 DIAGNOSIS — K219 Gastro-esophageal reflux disease without esophagitis: Secondary | ICD-10-CM

## 2012-04-30 DIAGNOSIS — E1165 Type 2 diabetes mellitus with hyperglycemia: Secondary | ICD-10-CM

## 2012-04-30 MED ORDER — INSULIN GLARGINE 100 UNIT/ML ~~LOC~~ SOLN: 34.0000 [IU] | Freq: Two times a day (BID) | SUBCUTANEOUS | Status: DC

## 2012-04-30 MED ORDER — INSULIN ASPART 100 UNIT/ML ~~LOC~~ SOLN: 15.0000 [IU] | Freq: Three times a day (TID) | SUBCUTANEOUS | Status: DC

## 2012-04-30 NOTE — Patient Instructions (Addendum)
Try taking a different antihistamine like loratadine (or Claritin), cetirizine (Zyrtec), or fexofenadine (Allegra). These are all over the counter medicines.   Stop taking the Benadryl to see if your stomach feels betters.  Continue your Lantus at 34 units twice daily and your Novolog 15 units with meals.  Get on list to talk with Britta Mccreedy about getting an [employer] through [employer].  Go to [facility] to fill out the paper work for the Medication Assistance Program to get your insulins sent to you.  Follow up with Dr. Elwyn Reach on Jan 30th.

## 2012-04-30 NOTE — Assessment & Plan Note (Signed)
Encouraged cessation of use of diphenhydramine and attempts to use a 2nd generation antihistamine to improve itching.   This should avoid anticholinergic worsening of GI transit time.  Reassess at next visit.

## 2012-04-30 NOTE — Assessment & Plan Note (Signed)
Mild-moderate Nicotine Dependence of 30 years duration in a patient who is fair candidate for success b/c she recently quit for several days while admitted. Encouraged her to continue to reduce smoking as much as possible with not feeling well. Discussed picking a quit date in the future when she is feeling better.  Patient is willing to cut down to 2 cigarettes per day at this time.   Encouraged continued progress toward complete cessation.

## 2012-04-30 NOTE — Progress Notes (Signed)
Patient ID: [name] [name], female   DOB: 1950-12-12, 62 y.o.   MRN: [id number] Reviewed: Agree with Dr. Macky Lower management and documentation.

## 2012-04-30 NOTE — Assessment & Plan Note (Signed)
Long-standing diabetes currently under fair  And improved control of blood glucose based on   Lab Results  Component Value Date   HGBA1C 10.1* 04/18/2012    ,home fasting CBG readings with the lowest of 161. Control is suboptimal due to inadequate insulin dose in addition to financial strain. Denies hypoglycemic events.  Able to verbalize appropriate hypoglycemia management plan. Continued basal insulin Lantus (insulin glargine) at 34 units BID. Continued rapid insulin Novolog (insulin aspart) at 15 units with meals.  Written patient instructions provided.  Patient instructed to set up appointment with Britta Mccreedy in order to get set up for [facility]. Also encouraged to go to [facility] to get signed up for MAP program in order to get her insulins.  Prescriptions for both insulins called in. Lantus vial and 2 Novolog Pens given to last pt until she starts receiving the meds.  Encouraged pt to stop using Benadryl which can slow her stomach and to try an OTC second generation antihistamine for itching. Follow up with Dr. Elwyn Reach later this month and follow up with Pharmacist Clinic in February.   Total time in face to face counseling 20 minutes.  Patient seen with Doris Cheadle PharmD, Pharmacy Resident.

## 2012-04-30 NOTE — Progress Notes (Signed)
  Subjective:    Patient ID: [name] [name], female    DOB: [birth year], 62 y.o.   MRN: [id number]  HPI Pt arrives in clinic not feeling well as she states she is getting over the flu.  She is also very frustrated that she got denied Medicaid/Disability. She reports her blood sugars have been better since increasing her Lantus to 34 units BID.  Did not bring her meter in. She reports the lowest being 161 with none of them being over 300. She is also on Novolog 15 units with meals; usually 2 meals/day. Denies hypoglycemic symptoms.  Still complaining of nausea. Pt reports using Benadryl most days of the week in order to help with itching.    Last visit, discussed smoking cessation with challenge of not smoking at all the day of visit.  Pt reports she smoked 2 cigarettes that day and has been smoking about 3 cigs/day since then.   Review of Systems     Objective:   Physical Exam  Lantus vials: Samples of this drug were given to the patient, quantity 1 vial, Lot Number [id number], Exp 6/15  Novolog pens: Samples of this drug were given to the patient, quantity 2 pens, Lot Number [id number], Exp 8/15      Assessment & Plan:   Long-standing diabetes currently under fair  And improved control of blood glucose based on   Lab Results  Component Value Date   HGBA1C 10.1* 04/18/2012    ,home fasting CBG readings with the lowest of 161. Control is suboptimal due to inadequate insulin dose in addition to financial strain. Denies hypoglycemic events.  Able to verbalize appropriate hypoglycemia management plan. Continued basal insulin Lantus (insulin glargine) at 34 units BID. Continued rapid insulin Novolog (insulin aspart) at 15 units with meals.  Written patient instructions provided.  Patient instructed to set up appointment with Britta Mccreedy in order to get set up for [facility]. Also encouraged to [facility] to get signed up for MAP program in order to get her insulins.  Prescriptions for both  insulins called in. Lantus vial and 2 Novolog Pens given to last pt until she starts receiving the meds.  Encouraged pt to stop using Benadryl which can slow her stomach and to try an OTC second generation antihistamine for itching. Follow up with Dr. Elwyn Reach later this month and follow up with Pharmacist [facility] in February.   Total time in face to face counseling 20 minutes.  Patient seen with Doris Cheadle PharmD, Pharmacy Resident.  Mild-moderate Nicotine Dependence of 30 years duration in a patient who is fair candidate for success b/c she recently quit for several days while admitted. Encouraged her to continue to reduce smoking as much as possible with not feeling well. Discussed picking a quit date in the future when she is feeling better.  Patient is willing to cut down to 2 cigarettes per day at this time.   Encouraged continued progress toward complete cessation.

## 2012-05-07 ENCOUNTER — Ambulatory Visit: Payer: Medicare Other | Admitting: Pharmacist

## 2012-05-07 ENCOUNTER — Ambulatory Visit: Payer: Medicare Other | Admitting: Emergency Medicine

## 2012-05-15 ENCOUNTER — Ambulatory Visit
Admission: RE | Admit: 2012-05-15 | Discharge: 2012-05-15 | Disposition: A | Discharge status: Home / Self Care | Payer: Medicare Other | Source: Ambulatory Visit | Attending: Orthopaedic Surgery | Admitting: Orthopaedic Surgery

## 2012-05-15 DIAGNOSIS — M47812 Spondylosis without myelopathy or radiculopathy, cervical region: Secondary | ICD-10-CM

## 2012-05-20 ENCOUNTER — Ambulatory Visit (INDEPENDENT_AMBULATORY_CARE_PROVIDER_SITE_OTHER): Payer: Medicare Other | Admitting: Emergency Medicine

## 2012-05-20 ENCOUNTER — Encounter: Payer: Self-pay | Admitting: Emergency Medicine

## 2012-05-20 ENCOUNTER — Ambulatory Visit (INDEPENDENT_AMBULATORY_CARE_PROVIDER_SITE_OTHER): Payer: Medicare Other | Admitting: Pharmacist

## 2012-05-20 ENCOUNTER — Encounter: Payer: Self-pay | Admitting: Pharmacist

## 2012-05-20 VITALS — BP 176/91 | HR 108 | Ht 64.0 in | Wt 220.0 lb

## 2012-05-20 DIAGNOSIS — IMO0001 Reserved for inherently not codable concepts without codable children: Secondary | ICD-10-CM

## 2012-05-20 DIAGNOSIS — G894 Chronic pain syndrome: Secondary | ICD-10-CM

## 2012-05-20 DIAGNOSIS — I10 Essential (primary) hypertension: Secondary | ICD-10-CM

## 2012-05-20 DIAGNOSIS — K861 Other chronic pancreatitis: Secondary | ICD-10-CM

## 2012-05-20 DIAGNOSIS — E1165 Type 2 diabetes mellitus with hyperglycemia: Secondary | ICD-10-CM

## 2012-05-20 DIAGNOSIS — Z716 Tobacco abuse counseling: Secondary | ICD-10-CM

## 2012-05-20 DIAGNOSIS — K859 Acute pancreatitis without necrosis or infection, unspecified: Secondary | ICD-10-CM

## 2012-05-20 DIAGNOSIS — F17200 Nicotine dependence, unspecified, uncomplicated: Secondary | ICD-10-CM

## 2012-05-20 DIAGNOSIS — Z7189 Other specified counseling: Secondary | ICD-10-CM

## 2012-05-20 MED ORDER — LISINOPRIL 40 MG PO TABS: 40.0000 mg | ORAL_TABLET | Freq: Every day | ORAL | Status: DC

## 2012-05-20 MED ORDER — HYDROCHLOROTHIAZIDE 25 MG PO TABS: 25.0000 mg | ORAL_TABLET | Freq: Every day | ORAL | Status: DC

## 2012-05-20 MED ORDER — INSULIN GLARGINE 100 UNIT/ML ~~LOC~~ SOLN: 34.0000 [IU] | Freq: Two times a day (BID) | SUBCUTANEOUS | Status: DC

## 2012-05-20 MED ORDER — ESOMEPRAZOLE MAGNESIUM 40 MG PO CPDR: 40.0000 mg | DELAYED_RELEASE_CAPSULE | Freq: Every day | ORAL | Status: DC

## 2012-05-20 MED ORDER — IBUPROFEN 800 MG PO TABS: 800.0000 mg | ORAL_TABLET | Freq: Three times a day (TID) | ORAL | Status: DC | PRN

## 2012-05-20 MED ORDER — INSULIN ASPART 100 UNIT/ML ~~LOC~~ SOLN: 15.0000 [IU] | Freq: Three times a day (TID) | SUBCUTANEOUS | Status: DC

## 2012-05-20 MED ORDER — ONDANSETRON HCL 4 MG PO TABS: 4.0000 mg | ORAL_TABLET | Freq: Three times a day (TID) | ORAL | Status: DC | PRN

## 2012-05-20 NOTE — Progress Notes (Signed)
  Subjective:    Patient ID: [name] [name], female    DOB: [birth year], 62 y.o.   MRN: [id number]  HPI [name] [name] is here for f/u of chronic issues.  Hypertension Well controlled: no Compliant with medication: yes Side effects from medication: no Check BP at home: no  Chest pain: no Palpitations: no Vision changes: no Leg edema: no Dizziness: no  Diabetes Well controlled: no Compliant with medications: yes Side effects from medications: no Check sugars at home: yes; states that her sugar goes up whenever she eats anything.  Sugar ranges: 200-400  Polyuria: no Polydipsia: no Vision changes: no Hypoglycemic symptoms: no  Eye exam: due Microalbumin: n/a on ACE-I  Chronic Pain Primarily located in right arm and neck; today in right leg as well.  No weakness or bowel/bladder incontinence.  She is being seen by Dr. Ophelia Charter who, per pt report, is planning on doing a carpal tunnel surgery followed by possible cervical spine surgery.  The Lyrica does help some, but she says in "wears off" in the afternoons.  Requests ibuprofen 800mg  tablets.    I have reviewed and updated the following as appropriate: allergies and current medications SHx: current smoker   Review of Systems See HPI    Objective:   Physical Exam BP 176/91  Pulse 108  Ht 5\' 4"  (1.626 m)  Wt 220 lb (99.791 kg)  BMI 37.76 kg/m2 Gen: alert, cooperative, NAD HEENT: AT/[place], sclera white, MMM CV: RRR, no murmurs Pulm: CTAB, no wheezes or rales Ext: no edema Neuro: 5/5 strength in grip, hip flexors and dorsi-flexion bilaterally; DTRs 1+ and symmetric     Assessment & Plan:

## 2012-05-20 NOTE — Patient Instructions (Addendum)
Try to go all day Sunday without smoking.  Take it one day at a time.    Continue your Lantus 34 units twice a day. Continue your Novolog 15 units with meals.  Follow up with Dr. Elwyn Reach in 2-4 weeks.

## 2012-05-20 NOTE — Progress Notes (Signed)
  Subjective:    Patient ID: [name] [name], female    DOB: 06-16-1950, 62 y.o.   MRN: [id number]  HPI Pt arrives after visit with Dr. Elwyn Reach, using a cane.  She has been having difficulty affording her insulin, and since last visit, she found out she did not qualify for MAP.  She working with Britta Mccreedy for other options.  She is currently using Lantus 34 units BID and Novolog 15 units with meals.  She has not been checking her glucose as frequently due to being low on strips.  Her log shows readings ranging from 192-426.  She is still having nausea and little appetite, so her eating has been erratic.  She reports mostly eating broths and soups, but she also later reports having had some toast, french toast, potatoes, and chocolate ice cream.    She has been reducing the number of cigarettes she smokes and reports she wants to quit.  She reports smoking ~3 cigarettes most days.  Reports being upset and anxious as her triggers. Confidence in quitting is 5/10.     Review of Systems     Objective:   Physical Exam        Assessment & Plan:   Diabetes of many yrs duration currently under fair control of blood glucose based on   Lab Results  Component Value Date   HGBA1C 10.1* 04/18/2012    ,home CBGs ranging from 192-426. Control is suboptimal due to inadequate insulin regimen in addition to erratic eating habits. Denies hypoglycemic events.  Able to verbalize appropriate hypoglycemia management plan. Continued basal insulin Lantus (insulin glargine) at 34 units BID. Continued rapid insulin Novolog (insulin aspart) at 15 units with meals. Samples of Lantus pens and Novolog pens provided. Written patient instructions provided.  Follow up with Dr. Elwyn Reach in 3-4 weeks.   Total time in face to face counseling 20 minutes.  Patient seen with Doris Cheadle PharmD, Pharmacy Resident.   Mild nicotine Dependence of many years duration in a patient who is fair candidate for success b/c of stress and lack of  confidence quitting. Set goal to stop smoking on Sunday (05/23/2012).  Written information provided.

## 2012-05-20 NOTE — Assessment & Plan Note (Addendum)
Working with Dr. Raymondo Band on insulin dosing.  Continue to adjust insulin based on his recommendations.  A1c due next month.  Discussed need for annual eye exam.  Patient will call her eye doctor for appt.

## 2012-05-20 NOTE — Assessment & Plan Note (Signed)
Mild nicotine Dependence of many years duration in a patient who is fair candidate for success b/c of stress and lack of confidence quitting. Set goal to stop smoking on Sunday (05/23/2012).  Written information provided.

## 2012-05-20 NOTE — Assessment & Plan Note (Signed)
Still taking mostly liquids and soft foods since most recent discharge.  Discussed gradual return to solids as tolerated.

## 2012-05-20 NOTE — Assessment & Plan Note (Signed)
Multifactorial with diabetic neuropathy and likely either spinal or carpal tunnel as well.  Being seen by Dr. Ophelia Charter for carpal tunnel and possible cervical problems.  Continue lyrica 150mg  BID.  Will give ibuprofen 800mg  tablets as well.  Renal function normal in last month.  Discussed possibility of pain management referral in the future, and she was agreeable with this if needed.

## 2012-05-20 NOTE — Patient Instructions (Addendum)
It was nice to see you!  I'm sorry you aren't feeling well. We are going to add HCTZ 25mg  daily to help with your blood pressure. I have also sent in a prescription for ibuprofen 800mg  tablets.  Follow up in 2-4 weeks to see how things are going.

## 2012-05-20 NOTE — Assessment & Plan Note (Signed)
Uncontrolled.  Will add HCTZ 25mg  daily to her regimen.  I am reluctant to start amlodipine given her report of lower extremity swelling.  Follow up in 2-4 weeks for recheck.

## 2012-05-20 NOTE — Assessment & Plan Note (Signed)
Diabetes of many yrs duration currently under fair control of blood glucose based on   Lab Results  Component Value Date   HGBA1C 10.1* 04/18/2012    ,home CBGs ranging from 192-426. Control is suboptimal due to inadequate insulin regimen in addition to erratic eating habits. Denies hypoglycemic events.  Able to verbalize appropriate hypoglycemia management plan. Continued basal insulin Lantus (insulin glargine) at 34 units BID. Continued rapid insulin Novolog (insulin aspart) at 15 units with meals. Samples of Lantus pens and Novolog pens provided. Written patient instructions provided.  Follow up with Dr. Elwyn Reach in 3-4 weeks.   Total time in face to face counseling 20 minutes.  Patient seen with Doris Cheadle PharmD, Pharmacy Resident.

## 2012-05-21 NOTE — Progress Notes (Signed)
Patient ID: [name] [name], female   DOB: 1951-01-23, 62 y.o.   MRN: [id number] Reviewed:  Agree with Dr Macky Lower documentation and management.

## 2012-05-26 ENCOUNTER — Telehealth: Payer: Self-pay | Admitting: Emergency Medicine

## 2012-05-26 NOTE — Telephone Encounter (Signed)
Attending Physician's Statement paperwork to be completed by [facility]. Paperwork placed in Booth's box.

## 2012-05-28 NOTE — Telephone Encounter (Signed)
Called pt and left message with son to call us back. Please see Dr.Booth's note. [name] [name], [name]

## 2012-05-28 NOTE — Telephone Encounter (Signed)
I filled out the physician portion of the paperwork.  There is still a portion that she needs to fill out.  I have placed the packet up front for her to pick up.

## 2012-05-31 NOTE — Telephone Encounter (Signed)
Called pt. She will pick up form tomorrow. Lorenda Hatchet, [name]

## 2012-06-09 ENCOUNTER — Telehealth: Payer: Self-pay | Admitting: Emergency Medicine

## 2012-06-09 NOTE — Telephone Encounter (Signed)
Received [facility] paperwork.  I have filled out the physician portion, but patient needs to fill out the Authorization to Release Information section prior to returning form to [facility].  I have left a voicemail on the [phone] number and was unable to reach her via the mobile number.  The form is up front for the patient to pick up and complete.

## 2012-06-11 ENCOUNTER — Ambulatory Visit (INDEPENDENT_AMBULATORY_CARE_PROVIDER_SITE_OTHER): Payer: Medicare Other | Admitting: Emergency Medicine

## 2012-06-11 ENCOUNTER — Encounter: Payer: Self-pay | Admitting: Emergency Medicine

## 2012-06-11 VITALS — BP 150/70 | Temp 98.3°F | Ht 64.0 in | Wt 223.0 lb

## 2012-06-11 DIAGNOSIS — F32A Depression, unspecified: Secondary | ICD-10-CM

## 2012-06-11 DIAGNOSIS — F411 Generalized anxiety disorder: Secondary | ICD-10-CM

## 2012-06-11 DIAGNOSIS — F323 Major depressive disorder, single episode, severe with psychotic features: Secondary | ICD-10-CM

## 2012-06-11 DIAGNOSIS — G894 Chronic pain syndrome: Secondary | ICD-10-CM

## 2012-06-11 DIAGNOSIS — F329 Major depressive disorder, single episode, unspecified: Secondary | ICD-10-CM

## 2012-06-11 DIAGNOSIS — I10 Essential (primary) hypertension: Secondary | ICD-10-CM

## 2012-06-11 MED ORDER — QUETIAPINE FUMARATE 200 MG PO TABS: 100.0000 mg | ORAL_TABLET | Freq: Every day | ORAL | Status: DC

## 2012-06-11 MED ORDER — KETOROLAC TROMETHAMINE 60 MG/2ML IM SOLN
60.0000 mg | Freq: Once | INTRAMUSCULAR | Status: AC
  Administered 2012-06-11: 60 mg via INTRAMUSCULAR

## 2012-06-11 MED ORDER — CITALOPRAM HYDROBROMIDE 10 MG PO TABS: 10.0000 mg | ORAL_TABLET | Freq: Every day | ORAL | Status: DC

## 2012-06-11 MED ORDER — ESOMEPRAZOLE MAGNESIUM 40 MG PO CPDR: 40.0000 mg | DELAYED_RELEASE_CAPSULE | Freq: Every day | ORAL | Status: DC

## 2012-06-11 NOTE — Patient Instructions (Addendum)
It was nice to see you! Your blood pressure is much better today.  We are going to start a medicine to help with anxiety and mood called Celexa.  Take 10mg  (1 tablet) daily for 1 week, then increase to 2 tablets daily.  If you start having jerking movements or heart palpitations, please stop the celexa and come in to see me. Follow up in 2-4 weeks to see how things are going.

## 2012-06-11 NOTE — Assessment & Plan Note (Signed)
Currently with headache and mild flare of chronic pain.  Will give toradol 60mg  IM in clinic today.  Continue lyrica 150mg  BID.

## 2012-06-11 NOTE — Assessment & Plan Note (Signed)
Better today, but still not at goal.  Will not make any changes given mental state.  Will likely need to add amlodipine or hydralazine.  May also need referral to ambulatory BP monitoring clinic.

## 2012-06-11 NOTE — Assessment & Plan Note (Signed)
PHQ-9: 20 (3 for anhedonia, depression, energy, appetite, concentration; 2 for sleep, motion; 1 for bad about self) GAD: 14 No SI/HI Will start celexa 10mg  daily, increase to 20mg  daily after 1 week. Possibility of prolonged QTc - will check EKG at follow up. Possibility of serotonin syndrome - low risk given low doses, warned patient to stop if develops muscle symptoms. Gave information on finding counselors [email or web address]) Follow up in 2 weeks.

## 2012-06-11 NOTE — Progress Notes (Signed)
  Subjective:    Patient ID: [name] [name], female    DOB: 09/03/1950, 62 y.o.   MRN: [id number]  HPI [name] [name] is here for f/u.  Hypertension Well controlled: no Compliant with medication: yes Side effects from medication: no Check BP at home: no  Chest pain: no Palpitations: no Vision changes: no Leg edema: no Dizziness: no  Depression/Anxiety She reports feeling like she is having panic attacks - states that she cannot be around people because she gets irritable.  Says the seroquel helps at night, but needs something during the day.  See A/P for PHQ-9 and GAD.  No SI/HI.  Chronic Pain Reports doing okay, but having a little flare over the last 3-4 days.  Headache today as well.  Would like something to help with the headache.  I have reviewed and updated the following as appropriate: allergies, current medications, past family history, past medical history, past social history, past surgical history and problem list SHx: current some day smoker  Review of Systems See HPI    Objective:   Physical Exam BP 150/70  Temp(Src) 98.3 F (36.8 C) (Oral)  Ht 5\' 4"  (1.626 m)  Wt 223 lb (101.152 kg)  BMI 38.26 kg/m2 Gen: alert, cooperative, appears tired and down HEENT: AT/[place], sclera white, MMM Neck: supple CV: RRR, no murmurs Pulm: CTAB, no wheezes or rales Ext: no edema, 2+ DP pulses bilaterally     Assessment & Plan:

## 2012-06-14 ENCOUNTER — Telehealth: Payer: Self-pay | Admitting: Emergency Medicine

## 2012-06-14 MED ORDER — GLUCOSE BLOOD VI STRP: ORAL_STRIP | Status: DC

## 2012-06-14 NOTE — Telephone Encounter (Signed)
[name] [name] need a rx sent to [facility] on [address] because she was [facility] and they need the rx for the test strips.  She is completely out of them and need this sent asap

## 2012-06-14 NOTE — Telephone Encounter (Signed)
Prescription for OneTouch Ultra test strips sent to pharmacy.

## 2012-06-14 NOTE — Telephone Encounter (Signed)
Called pt. Her glucometer [facility]: ONE TOUCH ULTRA. Fwd. To PCP for Rx. Lorenda Hatchet, [name]

## 2012-06-15 ENCOUNTER — Other Ambulatory Visit: Payer: Self-pay | Admitting: Emergency Medicine

## 2012-06-15 MED ORDER — GLUCOSE BLOOD VI STRP: ORAL_STRIP | Status: DC

## 2012-06-28 ENCOUNTER — Ambulatory Visit: Payer: Medicare Other | Admitting: Pharmacist

## 2012-07-01 ENCOUNTER — Encounter: Payer: Self-pay | Admitting: Pharmacist

## 2012-07-01 ENCOUNTER — Ambulatory Visit (INDEPENDENT_AMBULATORY_CARE_PROVIDER_SITE_OTHER): Payer: Medicare Other | Admitting: Pharmacist

## 2012-07-01 VITALS — BP 179/97 | HR 77 | Ht 64.17 in | Wt 226.0 lb

## 2012-07-01 DIAGNOSIS — E1143 Type 2 diabetes mellitus with diabetic autonomic (poly)neuropathy: Secondary | ICD-10-CM

## 2012-07-01 DIAGNOSIS — E1149 Type 2 diabetes mellitus with other diabetic neurological complication: Secondary | ICD-10-CM

## 2012-07-01 DIAGNOSIS — E1165 Type 2 diabetes mellitus with hyperglycemia: Secondary | ICD-10-CM

## 2012-07-01 DIAGNOSIS — K3184 Gastroparesis: Secondary | ICD-10-CM

## 2012-07-01 DIAGNOSIS — IMO0001 Reserved for inherently not codable concepts without codable children: Secondary | ICD-10-CM

## 2012-07-01 MED ORDER — FLUTICASONE PROPIONATE 50 MCG/ACT NA SUSP: 2.0000 | Freq: Every day | NASAL | Status: DC

## 2012-07-01 MED ORDER — INSULIN GLARGINE 100 UNIT/ML ~~LOC~~ SOLN: 34.0000 [IU] | Freq: Two times a day (BID) | SUBCUTANEOUS | Status: DC

## 2012-07-01 NOTE — Progress Notes (Signed)
Patient ID: [name] [name], female   DOB: 12/06/1950, 61 y.o.   MRN: [id number] Reviewed:  Agree with Dr Koval's documentation and management. 

## 2012-07-01 NOTE — Patient Instructions (Addendum)
It was good to see you today and congratulations on your smoking cessation efforts!  Continue to use Nicorette gum to curb your cigarette cravings. Try to get down to 3 cigarettes or less per day.  Please contact [employer] [place]) to determine if you have prescription coverage. If not, we will contact the Medication Assistance Program (MAP) to see how they might be able help.  Please call me in the clinic after  8:30am at [phone] to let me know about your prescription coverage.

## 2012-07-01 NOTE — Progress Notes (Addendum)
  Subjective:    Patient ID: [name] [name], female    DOB: [birth year], 62 y.o.   MRN: [id number]  HPI  Most recent quit attempt: Within the past month. Longest time ever been tobacco free: 2 days (endorsed withdrawal symptoms that made her start smoking again). What Medications (NRT, bupropion, varenicline) used in past includes Nicorette gum (8 pieces/day).  Rates IMPORTANCE of quitting tobacco on 1-10 scale of 10. Rates READINESS of quitting tobacco on 1-10 scale of 10. Rates CONFIDENCE of quitting tobacco on 1-10 scale of 3. Triggers to use tobacco include stress, withdrawal symptoms.     Review of Systems     Objective:   Physical Exam        Assessment & Plan:   Diabetes of many yrs duration currently under poor control of blood glucose based on   Lab Results  Component Value Date   HGBA1C 10.1* 04/18/2012  Continued basal insulin Lantus (insulin glargine) of 34 units twice daily. Continued rapid insulin Novolog (insulin aspart) to 15 units with large meals. Provided samples of Lantus Solostar and pen needles. Written patient instructions provided.   Patient was advised to call [employer] to inquire about prescription coverage. Asked patient to call us back with her findings on 07/02/12. The next steps would involve contacting MAP for medication assistance.  Has decreased daily cigarette count to about 4 daily. Uses 8 pieces of Nicorette Gum (2 mg) per day. Addressed the withdrawal symptoms she may experience at next cessation attempt and counseled her appropriately.   Follow up with Dr. Elwyn Reach on Monday, March 17 and in the Pharmacy Clinic in 1 month.   Total time in face to face counseling 35 minutes.  Patient seen with Tomi Bamberger PharmD, Pharmacy Resident and Beatrix Shipper, PharmD candidate.   Phone call follow up 07/02/2012 9:30 AM Patient reports having a "low subsidy copay" for the remainder of the year from [facility].   SHE has part D coverage! Will transmit  prescriptions for lantus, Novolog and Pen Needle tips.  Patient happy about having coverage.   Encouraged her to continue with he tobacco cessation efforts.

## 2012-07-01 NOTE — Assessment & Plan Note (Signed)
Diabetes of many yrs duration currently under poor control of blood glucose based on   Lab Results  Component Value Date   HGBA1C 10.1* 04/18/2012  Continued basal insulin Lantus (insulin glargine) of 34 units twice daily. Continued rapid insulin Novolog (insulin aspart) to 15 units with large meals. Provided samples of Lantus Solostar and pen needles. Written patient instructions provided.   Patient was advised to call [employer] to inquire about prescription coverage. Asked patient to call us back with her findings on 07/02/12. The next steps would involve contacting MAP for medication assistance.  Has decreased daily cigarette count to about 4 daily. Uses 8 pieces of Nicorette Gum (2 mg) per day. Addressed the withdrawal symptoms she may experience at next cessation attempt and counseled her appropriately.   Follow up with Dr. Elwyn Reach on Monday, March 17 and in the Pharmacy Clinic in 1 month.   Total time in face to face counseling 35 minutes.  Patient seen with Tomi Bamberger PharmD, Pharmacy Resident and Beatrix Shipper, PharmD candidate.

## 2012-07-02 MED ORDER — INSULIN PEN NEEDLE 31G X 8 MM MISC: 1.0000 | Status: DC | PRN

## 2012-07-02 MED ORDER — INSULIN ASPART 100 UNIT/ML ~~LOC~~ SOLN: 15.0000 [IU] | Freq: Three times a day (TID) | SUBCUTANEOUS | Status: DC

## 2012-07-02 MED ORDER — INSULIN GLARGINE 100 UNIT/ML ~~LOC~~ SOLN: 34.0000 [IU] | Freq: Two times a day (BID) | SUBCUTANEOUS | Status: DC

## 2012-07-02 NOTE — Addendum Note (Signed)
Addended by: Kathrin Ruddy on: 07/02/2012 10:11 AM   Modules accepted: Orders

## 2012-07-02 NOTE — Addendum Note (Signed)
Addended by: Kathrin Ruddy on: 07/02/2012 10:10 AM   Modules accepted: Orders

## 2012-07-05 ENCOUNTER — Ambulatory Visit: Payer: Medicare Other | Admitting: Emergency Medicine

## 2012-07-07 ENCOUNTER — Encounter: Payer: Self-pay | Admitting: *Deleted

## 2012-07-08 ENCOUNTER — Telehealth (INDEPENDENT_AMBULATORY_CARE_PROVIDER_SITE_OTHER): Payer: Self-pay | Admitting: *Deleted

## 2012-07-08 NOTE — Telephone Encounter (Signed)
Patient called to state that she is having pain in her upper abdominal area that is severe and burning in her lower abdomen.  Patient reports she had surgery a year and a half ago.  Patient schedule for long term follow up appt with Tsuei MD on 07/19/12.  Patient states she is thinking about going to the ED due to pain.

## 2012-07-09 ENCOUNTER — Emergency Department (HOSPITAL_COMMUNITY): Payer: Medicare Other

## 2012-07-09 ENCOUNTER — Emergency Department (HOSPITAL_COMMUNITY)
Admission: EM | Admit: 2012-07-09 | Discharge: 2012-07-10 | Disposition: A | Discharge status: Home / Self Care | Payer: Medicare Other | Attending: Emergency Medicine | Admitting: Emergency Medicine

## 2012-07-09 ENCOUNTER — Encounter (HOSPITAL_COMMUNITY): Payer: Self-pay | Admitting: Emergency Medicine

## 2012-07-09 DIAGNOSIS — R197 Diarrhea, unspecified: Secondary | ICD-10-CM | POA: Insufficient documentation

## 2012-07-09 DIAGNOSIS — K219 Gastro-esophageal reflux disease without esophagitis: Secondary | ICD-10-CM | POA: Insufficient documentation

## 2012-07-09 DIAGNOSIS — Z8673 Personal history of transient ischemic attack (TIA), and cerebral infarction without residual deficits: Secondary | ICD-10-CM | POA: Insufficient documentation

## 2012-07-09 DIAGNOSIS — K859 Acute pancreatitis without necrosis or infection, unspecified: Secondary | ICD-10-CM | POA: Insufficient documentation

## 2012-07-09 DIAGNOSIS — Z79899 Other long term (current) drug therapy: Secondary | ICD-10-CM | POA: Insufficient documentation

## 2012-07-09 DIAGNOSIS — R112 Nausea with vomiting, unspecified: Secondary | ICD-10-CM | POA: Insufficient documentation

## 2012-07-09 DIAGNOSIS — Z862 Personal history of diseases of the blood and blood-forming organs and certain disorders involving the immune mechanism: Secondary | ICD-10-CM | POA: Insufficient documentation

## 2012-07-09 DIAGNOSIS — J45909 Unspecified asthma, uncomplicated: Secondary | ICD-10-CM | POA: Insufficient documentation

## 2012-07-09 DIAGNOSIS — Z8701 Personal history of pneumonia (recurrent): Secondary | ICD-10-CM | POA: Insufficient documentation

## 2012-07-09 DIAGNOSIS — G909 Disorder of the autonomic nervous system, unspecified: Secondary | ICD-10-CM | POA: Insufficient documentation

## 2012-07-09 DIAGNOSIS — G8929 Other chronic pain: Secondary | ICD-10-CM | POA: Insufficient documentation

## 2012-07-09 DIAGNOSIS — Z8669 Personal history of other diseases of the nervous system and sense organs: Secondary | ICD-10-CM | POA: Insufficient documentation

## 2012-07-09 DIAGNOSIS — Z7982 Long term (current) use of aspirin: Secondary | ICD-10-CM | POA: Insufficient documentation

## 2012-07-09 DIAGNOSIS — Z872 Personal history of diseases of the skin and subcutaneous tissue: Secondary | ICD-10-CM | POA: Insufficient documentation

## 2012-07-09 DIAGNOSIS — F3289 Other specified depressive episodes: Secondary | ICD-10-CM | POA: Insufficient documentation

## 2012-07-09 DIAGNOSIS — Z8679 Personal history of other diseases of the circulatory system: Secondary | ICD-10-CM | POA: Insufficient documentation

## 2012-07-09 DIAGNOSIS — Z8709 Personal history of other diseases of the respiratory system: Secondary | ICD-10-CM | POA: Insufficient documentation

## 2012-07-09 DIAGNOSIS — Z794 Long term (current) use of insulin: Secondary | ICD-10-CM | POA: Insufficient documentation

## 2012-07-09 DIAGNOSIS — I10 Essential (primary) hypertension: Secondary | ICD-10-CM | POA: Insufficient documentation

## 2012-07-09 DIAGNOSIS — E1149 Type 2 diabetes mellitus with other diabetic neurological complication: Secondary | ICD-10-CM | POA: Insufficient documentation

## 2012-07-09 DIAGNOSIS — F411 Generalized anxiety disorder: Secondary | ICD-10-CM | POA: Insufficient documentation

## 2012-07-09 DIAGNOSIS — Z8719 Personal history of other diseases of the digestive system: Secondary | ICD-10-CM | POA: Insufficient documentation

## 2012-07-09 DIAGNOSIS — F329 Major depressive disorder, single episode, unspecified: Secondary | ICD-10-CM | POA: Insufficient documentation

## 2012-07-09 DIAGNOSIS — F17200 Nicotine dependence, unspecified, uncomplicated: Secondary | ICD-10-CM | POA: Insufficient documentation

## 2012-07-09 DIAGNOSIS — Z8739 Personal history of other diseases of the musculoskeletal system and connective tissue: Secondary | ICD-10-CM | POA: Insufficient documentation

## 2012-07-09 DIAGNOSIS — Z87448 Personal history of other diseases of urinary system: Secondary | ICD-10-CM | POA: Insufficient documentation

## 2012-07-09 LAB — CBC WITH DIFFERENTIAL/PLATELET
Basophils Absolute: 0.1 10*3/uL (ref 0.0–0.1)
Basophils Relative: 1 % (ref 0–1)
Eosinophils Absolute: 0.1 10*3/uL (ref 0.0–0.7)
Eosinophils Relative: 2 % (ref 0–5)
HCT: 37.7 % (ref 36.0–46.0)
Hemoglobin: 12.5 g/dL (ref 12.0–15.0)
Lymphocytes Relative: 45 % (ref 12–46)
Lymphs Abs: 2.9 10*3/uL (ref 0.7–4.0)
MCH: 27.7 pg (ref 26.0–34.0)
MCHC: 33.2 g/dL (ref 30.0–36.0)
MCV: 83.6 fL (ref 78.0–100.0)
Monocytes Absolute: 0.4 10*3/uL (ref 0.1–1.0)
Monocytes Relative: 6 % (ref 3–12)
Neutro Abs: 3 10*3/uL (ref 1.7–7.7)
Neutrophils Relative %: 46 % (ref 43–77)
Platelets: 173 10*3/uL (ref 150–400)
RBC: 4.51 MIL/uL (ref 3.87–5.11)
RDW: 13.8 % (ref 11.5–15.5)
WBC: 6.5 10*3/uL (ref 4.0–10.5)

## 2012-07-09 LAB — COMPREHENSIVE METABOLIC PANEL
ALT: 29 U/L (ref 0–35)
AST: 39 U/L — ABNORMAL HIGH (ref 0–37)
Albumin: 3.8 g/dL (ref 3.5–5.2)
Alkaline Phosphatase: 96 U/L (ref 39–117)
BUN: 8 mg/dL (ref 6–23)
CO2: 25 mEq/L (ref 19–32)
Calcium: 9.1 mg/dL (ref 8.4–10.5)
Chloride: 103 mEq/L (ref 96–112)
Creatinine, Ser: 0.63 mg/dL (ref 0.50–1.10)
GFR calc Af Amer: >90 mL/min (ref >90)
GFR calc non Af Amer: >90 mL/min (ref >90)
Glucose, Bld: 183 mg/dL — ABNORMAL HIGH (ref 70–99)
Potassium: 3.6 mEq/L (ref 3.5–5.1)
Sodium: 138 mEq/L (ref 135–145)
Total Bilirubin: 0.2 mg/dL — ABNORMAL LOW (ref 0.3–1.2)
Total Protein: 7.5 g/dL (ref 6.0–8.3)

## 2012-07-09 LAB — LIPASE, BLOOD: Lipase: 128 U/L — ABNORMAL HIGH (ref 11–59)

## 2012-07-09 LAB — GLUCOSE, CAPILLARY: Glucose-Capillary: 118 mg/dL — ABNORMAL HIGH (ref 70–99)

## 2012-07-09 MED ORDER — SODIUM CHLORIDE 0.9 % IV SOLN
Freq: Once | INTRAVENOUS | Status: AC
  Administered 2012-07-09: via INTRAVENOUS

## 2012-07-09 MED ORDER — SODIUM CHLORIDE 0.9 % IV BOLUS (SEPSIS)
1000.0000 mL | Freq: Once | INTRAVENOUS | Status: AC
  Administered 2012-07-09: 1000 mL via INTRAVENOUS

## 2012-07-09 MED ORDER — HYDROMORPHONE HCL PF 1 MG/ML IJ SOLN
1.0000 mg | INTRAMUSCULAR | Status: DC | PRN
  Administered 2012-07-09: 1 mg via INTRAVENOUS
  Filled 2012-07-09: qty 1

## 2012-07-09 MED ORDER — ONDANSETRON HCL 4 MG/2ML IJ SOLN
4.0000 mg | Freq: Once | INTRAMUSCULAR | Status: AC
  Administered 2012-07-09: 4 mg via INTRAVENOUS
  Filled 2012-07-09: qty 2

## 2012-07-09 MED ORDER — HYDROMORPHONE HCL PF 2 MG/ML IJ SOLN: 2.0000 mg | INTRAMUSCULAR | Status: DC | PRN

## 2012-07-09 NOTE — ED Notes (Signed)
I attempted to draw blood,on patient didn't have any success.Nurse was informed.

## 2012-07-09 NOTE — ED Notes (Signed)
Onset general abdominal pain 2 weeks ago continued today. History of pancreatitis and abdominal surgery 1 year ago.  States nausea, emesis, and diarrhea.
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
## 2012-07-10 MED ORDER — PROMETHAZINE HCL 25 MG PO TABS: 25.0000 mg | ORAL_TABLET | Freq: Four times a day (QID) | ORAL | Status: DC | PRN

## 2012-07-10 MED ORDER — HYDROMORPHONE HCL 2 MG PO TABS: 2.0000 mg | ORAL_TABLET | ORAL | Status: DC | PRN

## 2012-07-10 NOTE — ED Provider Notes (Signed)
Medical screening examination/treatment/procedure(s) were performed by non-physician practitioner and as supervising physician I was immediately available for consultation/collaboration.   Nelia Shi, MD 07/10/12 [phone]

## 2012-07-13 ENCOUNTER — Encounter: Payer: Self-pay | Admitting: Family Medicine

## 2012-07-13 ENCOUNTER — Ambulatory Visit (INDEPENDENT_AMBULATORY_CARE_PROVIDER_SITE_OTHER): Payer: Medicare Other | Admitting: Family Medicine

## 2012-07-13 VITALS — BP 183/94 | HR 80 | Temp 98.6°F | Ht 64.0 in | Wt 225.2 lb

## 2012-07-13 DIAGNOSIS — K859 Acute pancreatitis without necrosis or infection, unspecified: Secondary | ICD-10-CM

## 2012-07-13 LAB — COMPREHENSIVE METABOLIC PANEL
ALT: 14 U/L (ref 0–35)
AST: 14 U/L (ref 0–37)
Albumin: 4 g/dL (ref 3.5–5.2)
Alkaline Phosphatase: 90 U/L (ref 39–117)
BUN: 10 mg/dL (ref 6–23)
CO2: 25 mEq/L (ref 19–32)
Calcium: 9.2 mg/dL (ref 8.4–10.5)
Chloride: 106 mEq/L (ref 96–112)
Creat: 0.82 mg/dL (ref 0.50–1.10)
Glucose, Bld: 183 mg/dL — ABNORMAL HIGH (ref 70–99)
Potassium: 3.7 mEq/L (ref 3.5–5.3)
Sodium: 141 mEq/L (ref 135–145)
Total Bilirubin: 0.3 mg/dL (ref 0.3–1.2)
Total Protein: 7.2 g/dL (ref 6.0–8.3)

## 2012-07-13 LAB — LIPASE: Lipase: 68 U/L (ref 0–75)

## 2012-07-13 LAB — CBC WITH DIFFERENTIAL/PLATELET
Basophils Absolute: 0 10*3/uL (ref 0.0–0.1)
Basophils Relative: 1 % (ref 0–1)
Eosinophils Absolute: 0.1 10*3/uL (ref 0.0–0.7)
Eosinophils Relative: 2 % (ref 0–5)
HCT: 38.8 % (ref 36.0–46.0)
Hemoglobin: 12.8 g/dL (ref 12.0–15.0)
Lymphocytes Relative: 35 % (ref 12–46)
Lymphs Abs: 1.9 10*3/uL (ref 0.7–4.0)
MCH: 27.1 pg (ref 26.0–34.0)
MCHC: 33 g/dL (ref 30.0–36.0)
MCV: 82 fL (ref 78.0–100.0)
Monocytes Absolute: 0.4 10*3/uL (ref 0.1–1.0)
Monocytes Relative: 6 % (ref 3–12)
Neutro Abs: 3.1 10*3/uL (ref 1.7–7.7)
Neutrophils Relative %: 56 % (ref 43–77)
Platelets: 185 10*3/uL (ref 150–400)
RBC: 4.73 MIL/uL (ref 3.87–5.11)
RDW: 14.4 % (ref 11.5–15.5)
WBC: 5.5 10*3/uL (ref 4.0–10.5)

## 2012-07-13 MED ORDER — HYDROCODONE-ACETAMINOPHEN 5-325 MG PO TABS: 1.0000 | ORAL_TABLET | Freq: Four times a day (QID) | ORAL | Status: DC | PRN

## 2012-07-13 NOTE — Assessment & Plan Note (Addendum)
Acute portion of pain likely due to mild pancreatitis, with gastroparesis contributing.  Will fill hydrocodone, continue liquid diet.  Will check CMET, lipase, CBC today  Presentation likley affected by mood disorder.  Reports compliance with medications.  Given low lipase, overall benign presentation, likelihood of abscess or other serious acute etiology low.  Advised to follow up in 2-3 days if no improvement, would consider CT abdomen at that time.  Will follow-up with PCP next week to discuss further chronic abdominal pain concerns

## 2012-07-13 NOTE — Patient Instructions (Addendum)
If pain worsens or unable to keep down liquids, schedule follow-up or go to ER.    Keep follow-up with Dr. Elwyn Reach for Next week

## 2012-07-13 NOTE — Progress Notes (Addendum)
  Subjective:    Patient ID: [name] [name], female    DOB: [birth year], 62 y.o.   MRN: [id number]  HPIWork in appt  Patient presents for acute on chronic abdominal pain. Reports has had abdominal pain for several months but worsened and she went to ER.  Here today for Follow-up from ER 4 days ago for acute appendicitis with lipase of 128. Patient reports was discharged with pain medicines and antiemetics.  Reports taking odansetron, reglan, phenergan.  Tolerating liquid diet.  No further nausea or vomiting.  Reports pain 10/10  Patient has a history of multiple admission for pancreatitis, history of gastroparesis.  Also with chronic abdominal pain  Patient reports constant epigastric and bilaterally lower abdominal pain which she states is from her hernias or from her ovary.  Has history of hernia repair in 2012.  No fever, chills, diarrhea or constipation.  I asked patient what her usual triggers for pancreatitis re- and she states it is when she has panic attacks and stops her seroquel and subsequently can't sleep because of it.  States she is back on seroquel.   Review of Systems See HPI    Objective:   Physical Exam GEN: Alert & Oriented, No acute distress, morbidly obese CV:  Regular Rate & Rhythm, no murmur Respiratory:  Normal work of breathing, CTAB Abd:  + BS, soft, mild TTP epigastric and lower abdomen.  No hernia.   No rebound or guarding.  Reports pain out of proportion to exam/clinical presentation         Assessment & Plan:

## 2012-07-19 ENCOUNTER — Encounter (INDEPENDENT_AMBULATORY_CARE_PROVIDER_SITE_OTHER): Payer: Self-pay | Admitting: Surgery

## 2012-07-19 ENCOUNTER — Ambulatory Visit (INDEPENDENT_AMBULATORY_CARE_PROVIDER_SITE_OTHER): Payer: Medicare Other | Admitting: Surgery

## 2012-07-19 VITALS — BP 124/78 | HR 70 | Temp 98.6°F | Resp 16 | Ht 63.0 in | Wt 222.2 lb

## 2012-07-19 DIAGNOSIS — R1013 Epigastric pain: Secondary | ICD-10-CM

## 2012-07-19 MED ORDER — HYDROCODONE-ACETAMINOPHEN 5-325 MG PO TABS: 1.0000 | ORAL_TABLET | ORAL | Status: DC | PRN
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
## 2012-07-21 ENCOUNTER — Other Ambulatory Visit: Payer: Medicare Other

## 2012-07-22 ENCOUNTER — Ambulatory Visit
Admission: RE | Admit: 2012-07-22 | Discharge: 2012-07-22 | Disposition: A | Discharge status: Home / Self Care | Payer: Medicare Other | Source: Ambulatory Visit | Attending: Surgery | Admitting: Surgery

## 2012-07-22 DIAGNOSIS — R1013 Epigastric pain: Secondary | ICD-10-CM

## 2012-07-22 MED ORDER — IOHEXOL 300 MG/ML  SOLN
125.0000 mL | Freq: Once | INTRAMUSCULAR | Status: AC | PRN
  Administered 2012-07-22: 125 mL via INTRAVENOUS

## 2012-07-23 ENCOUNTER — Ambulatory Visit: Payer: Medicare Other | Admitting: Emergency Medicine

## 2012-07-26 ENCOUNTER — Ambulatory Visit: Payer: Medicare Other | Admitting: Emergency Medicine

## 2012-07-29 ENCOUNTER — Other Ambulatory Visit: Payer: Self-pay | Admitting: Emergency Medicine

## 2012-07-29 DIAGNOSIS — G894 Chronic pain syndrome: Secondary | ICD-10-CM

## 2012-07-29 MED ORDER — PREGABALIN 150 MG PO CAPS: 150.0000 mg | ORAL_CAPSULE | Freq: Two times a day (BID) | ORAL | Status: DC

## 2012-08-02 ENCOUNTER — Encounter: Payer: Self-pay | Admitting: Family Medicine

## 2012-08-02 ENCOUNTER — Ambulatory Visit (INDEPENDENT_AMBULATORY_CARE_PROVIDER_SITE_OTHER): Payer: Medicare Other | Admitting: Family Medicine

## 2012-08-02 VITALS — BP 160/80 | HR 80 | Temp 98.3°F | Resp 18 | Ht 63.0 in | Wt 220.0 lb

## 2012-08-02 DIAGNOSIS — J209 Acute bronchitis, unspecified: Secondary | ICD-10-CM

## 2012-08-02 MED ORDER — ALBUTEROL SULFATE HFA 108 (90 BASE) MCG/ACT IN AERS: 2.0000 | INHALATION_SPRAY | RESPIRATORY_TRACT | Status: DC | PRN

## 2012-08-02 MED ORDER — AZITHROMYCIN 500 MG PO TABS: 500.0000 mg | ORAL_TABLET | Freq: Every day | ORAL | Status: DC

## 2012-08-02 MED ORDER — BENZONATATE 100 MG PO CAPS: 100.0000 mg | ORAL_CAPSULE | Freq: Three times a day (TID) | ORAL | Status: DC | PRN

## 2012-08-02 NOTE — Assessment & Plan Note (Signed)
Acute bronchitis - discussed rest and hydration.  Will treat with Azithromycin x 5 days, Tessalon perles, and will refill Albuterol inhaler.  Patient denies any hx of COPD or asthma, but is a current smoker.  Discussed red flags per AVS.  Follow up as needed.

## 2012-08-02 NOTE — Progress Notes (Signed)
  Subjective:    Patient ID: [name] [name], female    DOB: 09-Mar-1951, 62 y.o.   MRN: [id number]  HPI  Patient presents with cough, sore throat, ear pain, and congestion for 3 weeks.  Symptoms have been getting worse.  She also complains of burning in chest after coughing spells.  She has tried several OTC medications without any relief.  She has had her flu shot this year.  Patient denies any vomiting, but endorses nausea, subjective fevers at home, and decreased appetite.  She can tolerate a clear liquid diet.    Review of Systems Per HPI    Objective:   Physical Exam  Constitutional: She appears well-nourished. No distress.  HENT:  Head: Normocephalic and atraumatic.  Right Ear: External ear normal.  Left Ear: External ear normal.  Mouth/Throat: Oropharynx is clear and moist. No oropharyngeal exudate.  Rhinorrhea; coughing spells throughout encounter  Cardiovascular: Normal rate and regular rhythm.   No murmur heard. Pulmonary/Chest: Effort normal. No respiratory distress. She has wheezes. She has no rales.  Skin: No rash noted.       Assessment & Plan:

## 2012-08-02 NOTE — Patient Instructions (Addendum)
It was nice to meet you today, [name] [name]. Start taking Azithromycin one tablet x 5 days. For cough, take Tessalon perles every 8 hours as needed. Use your Albuterol inhaler every 4 hours during the daytime for the next 2 days. If symptoms worsen or you develop chest pain, difficulty breathing please return to clinic or go to ER.  Acute Bronchitis You have acute bronchitis. This means you have a chest cold. The airways in your lungs are red and sore (inflamed). Acute means it is sudden onset.  CAUSES Bronchitis is most often caused by the same virus that causes a cold. SYMPTOMS   Body aches.  Chest congestion.  Chills.  Cough.  Fever.  Shortness of breath.  Sore throat. TREATMENT  Acute bronchitis is usually treated with rest, fluids, and medicines for relief of fever or cough. Most symptoms should go away after a few days or a week. Increased fluids may help thin your secretions and will prevent dehydration. Your caregiver may give you an inhaler to improve your symptoms. The inhaler reduces shortness of breath and helps control cough. You can take over-the-counter pain relievers or cough medicine to decrease coughing, pain, or fever. A cool-air vaporizer may help thin bronchial secretions and make it easier to clear your chest. Antibiotics are usually not needed but can be prescribed if you smoke, are seriously ill, have chronic lung problems, are elderly, or you are at higher risk for developing complications.Allergies and asthma can make bronchitis worse. Repeated episodes of bronchitis may cause longstanding lung problems. Avoid smoking and secondhand smoke.Exposure to cigarette smoke or irritating chemicals will make bronchitis worse. If you are a cigarette smoker, consider using nicotine gum or skin patches to help control withdrawal symptoms. Quitting smoking will help your lungs heal faster. Recovery from bronchitis is often slow, but you should start feeling better after 2 to 3  days. Cough from bronchitis frequently lasts for 3 to 4 weeks. To prevent another bout of acute bronchitis:  Quit smoking.  Wash your hands frequently to get rid of viruses or use a hand sanitizer.  Avoid other people with cold or virus symptoms.  Try not to touch your hands to your mouth, nose, or eyes. SEEK IMMEDIATE MEDICAL CARE IF:  You develop increased fever, chills, or chest pain.  You have severe shortness of breath or bloody sputum.  You develop dehydration, fainting, repeated vomiting, or a severe headache.  You have no improvement after 1 week of treatment or you get worse. MAKE SURE YOU:   Understand these instructions.  Will watch your condition.  Will get help right away if you are not doing well or get worse. Document Released: 05/15/2004 Document Revised: 06/30/2011 Document Reviewed: 07/31/2010 [facility] Patient Information 2013 [place], [place].

## 2012-08-13 ENCOUNTER — Ambulatory Visit (INDEPENDENT_AMBULATORY_CARE_PROVIDER_SITE_OTHER): Payer: Medicare Other | Admitting: Surgery

## 2012-08-13 ENCOUNTER — Encounter: Payer: Self-pay | Admitting: Emergency Medicine

## 2012-08-13 ENCOUNTER — Ambulatory Visit (HOSPITAL_COMMUNITY)
Admission: RE | Admit: 2012-08-13 | Discharge: 2012-08-13 | Disposition: A | Discharge status: Home / Self Care | Payer: Medicare Other | Source: Ambulatory Visit | Attending: Family Medicine | Admitting: Family Medicine

## 2012-08-13 ENCOUNTER — Emergency Department (HOSPITAL_COMMUNITY)
Admission: EM | Admit: 2012-08-13 | Discharge: 2012-08-13 | Disposition: A | Discharge status: Home / Self Care | Payer: Medicare Other

## 2012-08-13 ENCOUNTER — Other Ambulatory Visit: Payer: Self-pay | Admitting: Emergency Medicine

## 2012-08-13 ENCOUNTER — Encounter (INDEPENDENT_AMBULATORY_CARE_PROVIDER_SITE_OTHER): Payer: Self-pay | Admitting: Surgery

## 2012-08-13 ENCOUNTER — Ambulatory Visit (INDEPENDENT_AMBULATORY_CARE_PROVIDER_SITE_OTHER): Payer: Medicare Other | Admitting: Emergency Medicine

## 2012-08-13 VITALS — BP 178/95 | HR 80 | Ht 63.0 in | Wt 222.0 lb

## 2012-08-13 VITALS — BP 143/86 | HR 68 | Temp 98.6°F | Resp 14 | Ht 63.0 in | Wt 195.8 lb

## 2012-08-13 DIAGNOSIS — J449 Chronic obstructive pulmonary disease, unspecified: Secondary | ICD-10-CM | POA: Insufficient documentation

## 2012-08-13 DIAGNOSIS — I10 Essential (primary) hypertension: Secondary | ICD-10-CM

## 2012-08-13 DIAGNOSIS — J209 Acute bronchitis, unspecified: Secondary | ICD-10-CM

## 2012-08-13 DIAGNOSIS — R05 Cough: Secondary | ICD-10-CM | POA: Insufficient documentation

## 2012-08-13 DIAGNOSIS — R509 Fever, unspecified: Secondary | ICD-10-CM | POA: Insufficient documentation

## 2012-08-13 DIAGNOSIS — IMO0001 Reserved for inherently not codable concepts without codable children: Secondary | ICD-10-CM

## 2012-08-13 DIAGNOSIS — M47814 Spondylosis without myelopathy or radiculopathy, thoracic region: Secondary | ICD-10-CM | POA: Insufficient documentation

## 2012-08-13 DIAGNOSIS — J4489 Other specified chronic obstructive pulmonary disease: Secondary | ICD-10-CM | POA: Insufficient documentation

## 2012-08-13 DIAGNOSIS — I700 Atherosclerosis of aorta: Secondary | ICD-10-CM | POA: Insufficient documentation

## 2012-08-13 DIAGNOSIS — IMO0002 Reserved for concepts with insufficient information to code with codable children: Secondary | ICD-10-CM

## 2012-08-13 DIAGNOSIS — Z9889 Other specified postprocedural states: Secondary | ICD-10-CM | POA: Insufficient documentation

## 2012-08-13 DIAGNOSIS — F17200 Nicotine dependence, unspecified, uncomplicated: Secondary | ICD-10-CM | POA: Insufficient documentation

## 2012-08-13 DIAGNOSIS — E119 Type 2 diabetes mellitus without complications: Secondary | ICD-10-CM | POA: Insufficient documentation

## 2012-08-13 DIAGNOSIS — R1013 Epigastric pain: Secondary | ICD-10-CM

## 2012-08-13 DIAGNOSIS — R059 Cough, unspecified: Secondary | ICD-10-CM | POA: Insufficient documentation

## 2012-08-13 DIAGNOSIS — Z8719 Personal history of other diseases of the digestive system: Secondary | ICD-10-CM | POA: Insufficient documentation

## 2012-08-13 DIAGNOSIS — M4120 Other idiopathic scoliosis, site unspecified: Secondary | ICD-10-CM | POA: Insufficient documentation

## 2012-08-13 DIAGNOSIS — E1165 Type 2 diabetes mellitus with hyperglycemia: Secondary | ICD-10-CM

## 2012-08-13 DIAGNOSIS — R0602 Shortness of breath: Secondary | ICD-10-CM | POA: Insufficient documentation

## 2012-08-13 LAB — CBC WITH DIFFERENTIAL/PLATELET
Basophils Absolute: 0 10*3/uL (ref 0.0–0.1)
Basophils Relative: 1 % (ref 0–1)
Eosinophils Absolute: 0.1 10*3/uL (ref 0.0–0.7)
Eosinophils Relative: 2 % (ref 0–5)
HCT: 39.1 % (ref 36.0–46.0)
Hemoglobin: 12.9 g/dL (ref 12.0–15.0)
Lymphocytes Relative: 52 % — ABNORMAL HIGH (ref 12–46)
Lymphs Abs: 2.8 10*3/uL (ref 0.7–4.0)
MCH: 26.5 pg (ref 26.0–34.0)
MCHC: 33 g/dL (ref 30.0–36.0)
MCV: 80.5 fL (ref 78.0–100.0)
Monocytes Absolute: 0.3 10*3/uL (ref 0.1–1.0)
Monocytes Relative: 6 % (ref 3–12)
Neutro Abs: 2.1 10*3/uL (ref 1.7–7.7)
Neutrophils Relative %: 39 % — ABNORMAL LOW (ref 43–77)
Platelets: 182 10*3/uL (ref 150–400)
RBC: 4.86 MIL/uL (ref 3.87–5.11)
RDW: 14.9 % (ref 11.5–15.5)
WBC: 5.4 10*3/uL (ref 4.0–10.5)

## 2012-08-13 LAB — BASIC METABOLIC PANEL
BUN: 8 mg/dL (ref 6–23)
CO2: 31 mEq/L (ref 19–32)
Calcium: 9.1 mg/dL (ref 8.4–10.5)
Chloride: 102 mEq/L (ref 96–112)
Creat: 0.72 mg/dL (ref 0.50–1.10)
Glucose, Bld: 97 mg/dL (ref 70–99)
Potassium: 3 mEq/L — ABNORMAL LOW (ref 3.5–5.3)
Sodium: 143 mEq/L (ref 135–145)

## 2012-08-13 LAB — POCT GLYCOSYLATED HEMOGLOBIN (HGB A1C): Hemoglobin A1C: 8.4

## 2012-08-13 MED ORDER — HYDROCODONE-HOMATROPINE 5-1.5 MG/5ML PO SYRP: 5.0000 mL | ORAL_SOLUTION | Freq: Three times a day (TID) | ORAL | Status: DC | PRN

## 2012-08-13 MED ORDER — DICLOFENAC POTASSIUM 50 MG PO TABS: 50.0000 mg | ORAL_TABLET | Freq: Three times a day (TID) | ORAL | Status: DC

## 2012-08-13 MED ORDER — LEVOFLOXACIN 500 MG PO TABS: 500.0000 mg | ORAL_TABLET | Freq: Every day | ORAL | Status: DC

## 2012-08-13 MED ORDER — LEVOCETIRIZINE DIHYDROCHLORIDE 5 MG PO TABS: 5.0000 mg | ORAL_TABLET | Freq: Every evening | ORAL | Status: DC

## 2012-08-13 NOTE — Assessment & Plan Note (Signed)
Uncontrolled today.  Will treat respiratory issue and follow up next week.

## 2012-08-13 NOTE — Progress Notes (Signed)
  Subjective:    Patient ID: [name] [name], female    DOB: Jan 27, 1951, 62 y.o.   MRN: [id number]  HPI [name] [name] is here for follow up, however changed to primarily sick visit.  Cough She was seen 10 days ago for cough and shortness of breath.  She was given 5 days of azithromycin at that time which she completed, but has not noticed any improvement.  Continues to have cough and chest congestion.  States it feels like "I have water in my lungs," worse with lying flat.  Denies any worsening lower extremity edema or calf tenderness.  Does have some chest pain located centrally, feels like stinging, worse with cough, no change with deep breath, no radiation, no associated diaphoresis or dizziness.  Also has nasal congestion and left ear pain.  Hoarse voice as well.  She normal has seasonal allergies this time of year, not currently taking any allergy medications.  Denies fevers, chills, nasuea, vomiting.    I have reviewed and updated the following as appropriate: allergies and current medications SHx: current some day smoker  Review of Systems See HPI    Objective:   Physical Exam BP 178/95  Pulse 80  Ht 5\' 3"  (1.6 m)  Wt 222 lb (100.699 kg)  BMI 39.34 kg/m2  SpO2 90% Gen: alert, cooperative, NAD HEENT: AT/[place], sclera white, PERRL, MMM, mild pharyngeal erythema without exudate, nasal mucosa inflamed with mild swelling, clear discharge noted, TMs normal bilaterally Neck: supple, no LAD CV: RRR, no murmurs Pulm: CTAB, no wheezes or rales Chest: reproducible pain along right and left costrochondral margins Ext: trace edema bilaterally, negative Homan's sign     Assessment & Plan:

## 2012-08-13 NOTE — Progress Notes (Signed)
The patient returns today to discuss her epigastric abdominal pain.  She underwent laparoscopic mesh repair of the ventral hernia in 2012.  The CT scan showed only a tiny ventral defect containing only some fat.  I examined the CT scan personally and the hernia defect seems to be covered by the mesh, but the defect will still be open due to the fact that this was a laparoscopic repair.  She feels much better using the abdominal binder, but has had a recent bout of bronchitis with significant coughing spells.  This has made her abdomen more sore.  No palpable hernia today on physical examination.  She continues to have a persistent cough.   Continue abdominal binder No surgical treatment indicated.  Wilmon Arms. Corliss Skains, MD, [facility]  08/13/2012 11:47 AM

## 2012-08-13 NOTE — Patient Instructions (Addendum)
It was nice to see you! I'm sorry you're not feeling any better. Please go over the the hospital to get a chest x-ray. We are also going to check some blood work. I will call you about the chest x-ray this afternoon. Take Hycodan cough syrup every 8 hours as needed. Take diclofenac 3 times a day for the next 5 days, then as needed for the chest pain. Take Xyzal 5mg  daily for allergies. Use your albuterol inhaler as needed. Follow up with me next week, Tuesday if you can.

## 2012-08-13 NOTE — Assessment & Plan Note (Addendum)
No improvement after Z-pack.  Likely mixed with allergy symptoms as well.  O2 sat 90%.  DDx includes allergies, asthma, bronchitis, pneumonia, PE, CHF.  CHF is unlikely with normal cath/EF in the last year.  PE unlikley without tachycardia, pleuritic pain or signs of DVT. Will check CXR, cbc, bmp, bnp.  Xyzal 5mg  daily for allergies.  Hycodan cough syrup.  Diclofenac for chostochondral pain.  Continue albuterol q2-4 hours.  Will give antibiotics if indicated on CXR.  Follow up next week.  Once acute illness resolves, will get echo and PFTs.

## 2012-08-13 NOTE — Telephone Encounter (Signed)
REviewed x-rays.  No obvious PNA or pulmonary edema.  Given her continued cough and borderline O2 sat, will start levaquin 500mg  daily x7 days.  Left voicemail on mobile number informing patient of this.  Instructed her to call the office and the emergency line if she had any questions.

## 2012-08-13 NOTE — Assessment & Plan Note (Signed)
A1c 8.4, much improved.  Encouraged her to continue working with Dr. Raymondo Band.

## 2012-08-13 NOTE — Assessment & Plan Note (Addendum)
No improvement after Z-pack.  Likely mixed with allergy symptoms as well.  O2 sat 90%

## 2012-08-14 ENCOUNTER — Other Ambulatory Visit: Payer: Self-pay | Admitting: Family Medicine

## 2012-08-14 LAB — PRO B NATRIURETIC PEPTIDE: Pro B Natriuretic peptide (BNP): 170.4 pg/mL — ABNORMAL HIGH (ref <126)

## 2012-08-20 ENCOUNTER — Ambulatory Visit (INDEPENDENT_AMBULATORY_CARE_PROVIDER_SITE_OTHER): Payer: Medicare Other | Admitting: Emergency Medicine

## 2012-08-20 VITALS — BP 170/100 | HR 97 | Temp 98.5°F | Ht 63.0 in | Wt 220.5 lb

## 2012-08-20 DIAGNOSIS — G894 Chronic pain syndrome: Secondary | ICD-10-CM

## 2012-08-20 DIAGNOSIS — J209 Acute bronchitis, unspecified: Secondary | ICD-10-CM

## 2012-08-20 DIAGNOSIS — R059 Cough, unspecified: Secondary | ICD-10-CM

## 2012-08-20 DIAGNOSIS — R05 Cough: Secondary | ICD-10-CM

## 2012-08-20 MED ORDER — PREDNISONE 50 MG PO TABS: 50.0000 mg | ORAL_TABLET | Freq: Every day | ORAL | Status: DC

## 2012-08-20 MED ORDER — LEVOCETIRIZINE DIHYDROCHLORIDE 5 MG PO TABS: 5.0000 mg | ORAL_TABLET | Freq: Every evening | ORAL | Status: DC

## 2012-08-20 MED ORDER — KETOROLAC TROMETHAMINE 60 MG/2ML IM SOLN
60.0000 mg | Freq: Once | INTRAMUSCULAR | Status: AC
  Administered 2012-08-20: 60 mg via INTRAMUSCULAR

## 2012-08-20 MED ORDER — ALBUTEROL SULFATE (2.5 MG/3ML) 0.083% IN NEBU: 2.5000 mg | INHALATION_SOLUTION | Freq: Four times a day (QID) | RESPIRATORY_TRACT | Status: DC | PRN

## 2012-08-20 NOTE — Assessment & Plan Note (Addendum)
Continued cough with allergy symptoms.  No improvement with levaquin.  Suspect component of allergies - will give paper prescription for Xyzal this time.  With smoking history, will also try prednisone burst.  Discussed possibility of PE.  Felt to be low risk given good pulse ox and no tachycardia, no signs of DVT.  Toraldol 60mg  IM given for headache and costochondral chest pain. Follow up next week and if no improvement will get CTA to evaluate for PE.

## 2012-08-20 NOTE — Progress Notes (Signed)
  Subjective:    Patient ID: [name] [name], female    DOB: [birth year], 62 y.o.   MRN: [id number]  HPI Quaneshia Wareing is here for f/u of cough.  She reports no improvement with levaquin.  Continues to have cough, nasal congestion/rhinorrhea, headache, itchy/watery eyes.  Cough is non-productive.  Symptoms are worse at night.  Using her albuterol inhaler twice daily.  Wants to get a nebulizer to use.  Continues to have some chest pain with cough, now sternal and in the back.  She has not gotten the xyzal.   I have reviewed and updated the following as appropriate: allergies and current medications SHx: current smoker (no cigarettes in the last 5 days)  Review of Systems See HPI    Objective:   Physical Exam BP 170/100  Pulse 97  Temp(Src) 98.5 F (36.9 C) (Oral)  Ht 5\' 3"  (1.6 m)  Wt 220 lb 8 oz (100.018 kg)  BMI 39.07 kg/m2  SpO2 96% Gen: alert, cooperative, NAD,a ppears tired HEENT: AT/[place], sclera white, MMM Neck: supple CV: RRR, no murmurs Pulm: CTAB, no wheezes or rales Chest: reproducible pain on palpation of bilateral costochondral junctions Ext: trace edema bilaterally     Assessment & Plan:

## 2012-08-20 NOTE — Patient Instructions (Addendum)
It was nice to see you! I'm sorry you aren't feeling any better. Take prednisone 50mg  1 tablet daily for 5 days. Start Xyzal 1 tablet daily for allergies. I have ordered a nebulizer machine for you.  [facility] to arrange a time to teach you how to use it. Follow up with me next week.

## 2012-08-25 ENCOUNTER — Ambulatory Visit (INDEPENDENT_AMBULATORY_CARE_PROVIDER_SITE_OTHER): Payer: Medicare Other | Admitting: Emergency Medicine

## 2012-08-25 ENCOUNTER — Encounter: Payer: Self-pay | Admitting: Emergency Medicine

## 2012-08-25 VITALS — BP 176/63 | HR 91 | Temp 98.8°F | Ht 63.0 in | Wt 220.0 lb

## 2012-08-25 DIAGNOSIS — I10 Essential (primary) hypertension: Secondary | ICD-10-CM

## 2012-08-25 DIAGNOSIS — R05 Cough: Secondary | ICD-10-CM

## 2012-08-25 DIAGNOSIS — R059 Cough, unspecified: Secondary | ICD-10-CM

## 2012-08-25 MED ORDER — SPIRONOLACTONE 25 MG PO TABS: 25.0000 mg | ORAL_TABLET | Freq: Every day | ORAL | Status: DC

## 2012-08-25 NOTE — Assessment & Plan Note (Addendum)
Feeling much better after the steroid burst.  Still with some chest congestion.  Discussed that staying well hydrated is the best thing for this.  Also checked on the status of the nebulizer as this may help as well.  Patient to call the office on Friday if she has not been contacted about the nebulizer.  If back to baseline, will check echo and PFTs at 1 week f/u.

## 2012-08-25 NOTE — Assessment & Plan Note (Addendum)
Remains elevated despite feeling well today.  Has been elevated for the last 3-4 months.  Compliant with medications and on good doses of HCTZ, lisinopril and metoprolol.  We have tried amlodipine in the past but had to stop it due to lower extremity edema.  Will start spironolactone 25mg  daily.  Follow up in 1 week for BP recheck and labs.

## 2012-08-25 NOTE — Progress Notes (Signed)
  Subjective:    Patient ID: [name] [name], female    DOB: Mar 26, 1951, 62 y.o.   MRN: [id number]  HPI [name] [name] is here for cough f/u.  Cough She reports that this is much better after the steroids.  Feels like her sinuses are better.  Now only has some chest congestion with residual cough.  Has tried mucinex last month with no improvement.  Also states that she has not heard anything about the nebulizer yet.  Hypertension Well controlled: no Compliant with medication: yes Side effects from medication: no Check BP at home: no  Chest pain: no Palpitations: no Vision changes: no Leg edema: no Dizziness: no  I have reviewed and updated the following as appropriate: allergies and current medications SHx: current some day smoker (1 cigarette in the last 10 days)  Review of Systems See HPI    Objective:   Physical Exam BP 176/63  Pulse 91  Temp(Src) 98.8 F (37.1 C) (Oral)  Ht 5\' 3"  (1.6 m)  Wt 220 lb (99.791 kg)  BMI 38.98 kg/m2  SpO2 98% Gen: alert, cooperative, NAD HEENT: AT/[place], sclera white, MMM Neck: supple CV: RRR, no murmurs Pulm: CTAB, no wheezes or rales Ext: no edema     Assessment & Plan:

## 2012-08-25 NOTE — Patient Instructions (Addendum)
It was nice to see you! I'm glad you are feeling better. For the congestion - keep drinking lots of water.   I have spoken with my nurse about the nebulizer and you should get a call in the next day or two.  This will also likely help some with the congestion. Start spironolactone for your blood pressure.  It is 1 pill once a day. Follow up in 1 week.

## 2012-09-02 ENCOUNTER — Telehealth: Payer: Self-pay | Admitting: Emergency Medicine

## 2012-09-02 NOTE — Telephone Encounter (Signed)
Patient called her pharmacy for refill on her Lancets, I do not see that we received the refill request.  She is now out and needs this refilled as soon as possible.

## 2012-09-06 ENCOUNTER — Ambulatory Visit: Payer: Medicare Other | Admitting: Emergency Medicine

## 2012-09-07 ENCOUNTER — Other Ambulatory Visit: Payer: Self-pay | Admitting: *Deleted

## 2012-09-07 MED ORDER — ONETOUCH ULTRASOFT LANCETS MISC: Status: DC

## 2012-09-07 MED ORDER — GLUCOSE BLOOD VI STRP: ORAL_STRIP | Status: DC

## 2012-09-07 NOTE — Telephone Encounter (Signed)
Pt reports needing one touch Lancets for glucometer. Wyatt Haste, RN-  Please fax script to [facility] on [address] (fax number [phone]) for lancets with dx code and frequency of testing. NO record of lancets prescribed before but Medicare will cover with script.  Wyatt Haste, RN-BSN

## 2012-09-07 NOTE — Telephone Encounter (Signed)
Pt has been trying to get her lancets for her One Touch meter she is now out and needs to know what to do.

## 2012-09-17 ENCOUNTER — Ambulatory Visit (INDEPENDENT_AMBULATORY_CARE_PROVIDER_SITE_OTHER): Payer: Medicare Other | Admitting: Emergency Medicine

## 2012-09-17 ENCOUNTER — Encounter: Payer: Self-pay | Admitting: Emergency Medicine

## 2012-09-17 VITALS — BP 158/81 | HR 98 | Ht 63.0 in | Wt 223.0 lb

## 2012-09-17 DIAGNOSIS — G894 Chronic pain syndrome: Secondary | ICD-10-CM

## 2012-09-17 DIAGNOSIS — I10 Essential (primary) hypertension: Secondary | ICD-10-CM

## 2012-09-17 DIAGNOSIS — R059 Cough, unspecified: Secondary | ICD-10-CM

## 2012-09-17 DIAGNOSIS — R05 Cough: Secondary | ICD-10-CM

## 2012-09-17 DIAGNOSIS — R0602 Shortness of breath: Secondary | ICD-10-CM

## 2012-09-17 LAB — BASIC METABOLIC PANEL
BUN: 8 mg/dL (ref 6–23)
CO2: 26 mEq/L (ref 19–32)
Calcium: 9.2 mg/dL (ref 8.4–10.5)
Chloride: 101 mEq/L (ref 96–112)
Creat: 0.8 mg/dL (ref 0.50–1.10)
Glucose, Bld: 223 mg/dL — ABNORMAL HIGH (ref 70–99)
Potassium: 4.2 mEq/L (ref 3.5–5.3)
Sodium: 141 mEq/L (ref 135–145)

## 2012-09-17 LAB — CBC
HCT: 39.5 % (ref 36.0–46.0)
Hemoglobin: 13 g/dL (ref 12.0–15.0)
MCH: 27.6 pg (ref 26.0–34.0)
MCHC: 32.9 g/dL (ref 30.0–36.0)
MCV: 83.9 fL (ref 78.0–100.0)
Platelets: 134 10*3/uL — ABNORMAL LOW (ref 150–400)
RBC: 4.71 MIL/uL (ref 3.87–5.11)
RDW: 14.4 % (ref 11.5–15.5)
WBC: 5.6 10*3/uL (ref 4.0–10.5)

## 2012-09-17 MED ORDER — BECLOMETHASONE DIPROPIONATE 80 MCG/ACT IN AERS: 2.0000 | INHALATION_SPRAY | Freq: Two times a day (BID) | RESPIRATORY_TRACT | Status: DC

## 2012-09-17 MED ORDER — PREDNISONE 10 MG PO TABS: ORAL_TABLET | ORAL | Status: DC

## 2012-09-17 MED ORDER — ALBUTEROL SULFATE (2.5 MG/3ML) 0.083% IN NEBU
2.5000 mg | INHALATION_SOLUTION | Freq: Once | RESPIRATORY_TRACT | Status: AC
  Administered 2012-09-17: 2.5 mg via RESPIRATORY_TRACT

## 2012-09-17 MED ORDER — OLOPATADINE HCL 0.1 % OP SOLN: 1.0000 [drp] | Freq: Two times a day (BID) | OPHTHALMIC | Status: DC

## 2012-09-17 MED ORDER — IPRATROPIUM BROMIDE 0.02 % IN SOLN
0.5000 mg | Freq: Once | RESPIRATORY_TRACT | Status: AC
  Administered 2012-09-17: 0.5 mg via RESPIRATORY_TRACT

## 2012-09-17 MED ORDER — KETOROLAC TROMETHAMINE 60 MG/2ML IM SOLN
60.0000 mg | Freq: Once | INTRAMUSCULAR | Status: AC
  Administered 2012-09-17: 60 mg via INTRAMUSCULAR

## 2012-09-17 NOTE — Addendum Note (Signed)
Addended by: Jennette Bill on: 09/17/2012 12:07 PM   Modules accepted: Orders

## 2012-09-17 NOTE — Assessment & Plan Note (Addendum)
Likely multifactorial with allergies and COPD.  O2 sat 96% on room air.  No indication for CXR at this time.  Will continue xyzal for allergies.  Will give allergy eye drops as well.  Unable to tolerate nasal steroids.  Will give another prednisone burst with taper and start QVAR as well.  Toradol 60mg  IM for body aches.  Follow up in 2 weeks.  Will need PFTs at some point.  May also consider one time chest CT to screen for lung cancer in the future.

## 2012-09-17 NOTE — Assessment & Plan Note (Signed)
Much better today although still not at goal.  Will check BMP and consider increase spironolactone at follow up.

## 2012-09-17 NOTE — Patient Instructions (Addendum)
It was nice to see you! I'm sorry you aren't feeling well. I think your cough and other symptoms are from a combination of allergies and chronic lung disease. Continue xyzal daily. Start Patanol eye drops twice a day. Start a steroid taper - 5 pills daily for 5 days, then 4 pills daily for 2 days, then 3 pills daily for 2 days, then 2 pills daily for 2 days, then 1 pill daily for 2 days, then stop. Start QVAR - this is an inhaled steroid.  2 puffs twice a day, everyday. Follow up in 2 weeks.

## 2012-09-17 NOTE — Progress Notes (Signed)
  Subjective:    Patient ID: [name] [name], female    DOB: 22-May-1950, 62 y.o.   MRN: [id number]  HPI Adair Lemar is here for cough.  She continues to struggle with cough and allergy symptoms including itchy/watery eyes, runny nose, post-nasal drip.  Her cough has gotten worse and is productive of greenish brown sputum.  She states that of everything we have tried, the steroids helped the most, but her cough came back after she finished the 5-day burst.  She has been taking xyzal with minimal improvement.  She is unable to tolerate nasal steroids due to burning.  Not currently using any allergy eye drops.  Using her albuterol nebulizer every 6 hours with some improvement in cough.  Describes subjective fevers, but has not taken her temperature.  Also with diffuse body aches.  Tolerating fluids well.    I have reviewed and updated the following as appropriate: allergies and current medications SHx: current some day smoker  Review of Systems See HPI    Objective:   Physical Exam BP 158/81  Pulse 98  Ht 5\' 3"  (1.6 m)  Wt 223 lb (101.152 kg)  BMI 39.51 kg/m2  SpO2 96% Gen: alert, cooperative, NAD HEENT: AT/[place], sclera white, clear discharge from both eyes, MMM Neck: supple CV: RRR, no murmurs Pulm: coarse sounds with wheezing more on the right  After DuoNeb - lungs are clear bilaterally     Assessment & Plan:

## 2012-10-04 ENCOUNTER — Ambulatory Visit: Payer: Medicare Other | Admitting: Emergency Medicine

## 2012-10-15 ENCOUNTER — Other Ambulatory Visit: Payer: Self-pay | Admitting: *Deleted

## 2012-10-15 MED ORDER — METOCLOPRAMIDE HCL 10 MG PO TABS: 10.0000 mg | ORAL_TABLET | Freq: Three times a day (TID) | ORAL | Status: DC

## 2012-10-28 ENCOUNTER — Other Ambulatory Visit: Payer: Self-pay

## 2012-12-10 ENCOUNTER — Ambulatory Visit: Payer: Medicare Other | Admitting: Emergency Medicine

## 2012-12-24 ENCOUNTER — Telehealth: Payer: Self-pay | Admitting: Emergency Medicine

## 2012-12-24 DIAGNOSIS — G894 Chronic pain syndrome: Secondary | ICD-10-CM

## 2012-12-24 MED ORDER — PREGABALIN 150 MG PO CAPS: 150.0000 mg | ORAL_CAPSULE | Freq: Two times a day (BID) | ORAL | Status: DC

## 2012-12-24 NOTE — Telephone Encounter (Signed)
Prescription Lyric sent electronically to [facility].

## 2012-12-24 NOTE — Telephone Encounter (Signed)
Medication will not e-prescribe.  It is up front for pick up.

## 2012-12-24 NOTE — Telephone Encounter (Signed)
The patient is calling because her pharmacy has asked her to call because she needs a new Rx for her Lyrica as her refills have run out.  She has 1 tablet left and she is supposed to take 2 tablets a day.

## 2013-01-03 ENCOUNTER — Other Ambulatory Visit: Payer: Self-pay | Admitting: Family Medicine

## 2013-01-04 ENCOUNTER — Other Ambulatory Visit: Payer: Self-pay | Admitting: Emergency Medicine

## 2013-01-04 ENCOUNTER — Telehealth: Payer: Self-pay | Admitting: Emergency Medicine

## 2013-01-04 ENCOUNTER — Ambulatory Visit (INDEPENDENT_AMBULATORY_CARE_PROVIDER_SITE_OTHER): Payer: Medicare Other | Admitting: Emergency Medicine

## 2013-01-04 ENCOUNTER — Encounter: Payer: Self-pay | Admitting: Emergency Medicine

## 2013-01-04 VITALS — BP 148/83 | HR 82 | Temp 98.6°F | Ht 63.0 in | Wt 234.0 lb

## 2013-01-04 DIAGNOSIS — L600 Ingrowing nail: Secondary | ICD-10-CM

## 2013-01-04 DIAGNOSIS — E1165 Type 2 diabetes mellitus with hyperglycemia: Secondary | ICD-10-CM

## 2013-01-04 DIAGNOSIS — M797 Fibromyalgia: Secondary | ICD-10-CM

## 2013-01-04 DIAGNOSIS — G894 Chronic pain syndrome: Secondary | ICD-10-CM

## 2013-01-04 DIAGNOSIS — IMO0001 Reserved for inherently not codable concepts without codable children: Secondary | ICD-10-CM

## 2013-01-04 LAB — POCT GLYCOSYLATED HEMOGLOBIN (HGB A1C): Hemoglobin A1C: 8.8

## 2013-01-04 MED ORDER — HYDROCODONE-ACETAMINOPHEN 10-325 MG PO TABS: 1.0000 | ORAL_TABLET | Freq: Every day | ORAL | Status: DC | PRN

## 2013-01-04 MED ORDER — CEPHALEXIN 500 MG PO CAPS: 500.0000 mg | ORAL_CAPSULE | Freq: Three times a day (TID) | ORAL | Status: DC

## 2013-01-04 MED ORDER — INSULIN GLARGINE 100 UNIT/ML ~~LOC~~ SOLN: 36.0000 [IU] | Freq: Two times a day (BID) | SUBCUTANEOUS | Status: DC

## 2013-01-04 MED ORDER — NORTRIPTYLINE HCL 10 MG PO CAPS: 10.0000 mg | ORAL_CAPSULE | Freq: Every day | ORAL | Status: DC

## 2013-01-04 MED ORDER — INSULIN ASPART 100 UNIT/ML ~~LOC~~ SOLN: 15.0000 [IU] | Freq: Two times a day (BID) | SUBCUTANEOUS | Status: DC

## 2013-01-04 NOTE — Telephone Encounter (Signed)
Will fwd to MD.  [name] [name], CMA  

## 2013-01-04 NOTE — Telephone Encounter (Signed)
Pt called because Dr. Piedad Climes forgot to call her her antibiotics for her fingers today. She already picked up all the prescriptions but the antibiotics was not there. JW

## 2013-01-04 NOTE — Telephone Encounter (Signed)
I resent the prescription to [facility] on [address].

## 2013-01-04 NOTE — Assessment & Plan Note (Signed)
Flare for the last 3 weeks. Will add nortriptyline 10mg  qHS. Prescription for norco #30 tablets given. Referral to pain management placed at patient's request.

## 2013-01-04 NOTE — Progress Notes (Signed)
  Subjective:    Patient ID: [name] [name], female    DOB: [birth year], 62 y.o.   MRN: [id number]  HPI [name] [name] is here for f/u pain and diabetes.  Diabetes Compliant with medications: yes - states out of novolog.  Lantus 34 units BID.  Take novolog 15 units with meals BID, will take additional dose if CBG high. Side effects from medications: no Check sugars at home: yes  Sugar ranges: 190-300s; mostly in 200s.  Polyuria: no Polydipsia: no Vision changes: no Hypoglycemic symptoms: no  Foot exam: due Eye exam: sees Dr. Noel Gerold Microalbumin: n/a on ace-i  Chronic pain Reports flare of pain to 10/10 the last 3 weeks.  Pain is all over, in feet, arms, face, knees.  Described as stabbing pains.  Tramadol is not lasting long enough.  No weakness.  She is interested in seeing a pain specialist.  Ingrown nails She reports pain and swelling of her bilateral thumbs at the nail.  This has been on and off for the last month.  She has been unable to cut her nails due to the pain.  She reports some purulent drainage at home.  Has noticed some blisters on the left thumb tip and one on the right lateral nail edge.  She has been soaking the nails in hydrogen peroxide with warm water.  No fevers.    I have reviewed and updated the following as appropriate: allergies and current medications SHx: current smoker   Review of Systems See HPI    Objective:   Physical Exam BP 148/83  Pulse 82  Temp(Src) 98.6 F (37 C) (Oral)  Ht 5\' 3"  (1.6 m)  Wt 234 lb (106.142 kg)  BMI 41.46 kg/m2 Gen: alert, cooperative, NAD HEENT: AT/[place], sclera white, MMM Neck: supple CV: RRR, no murmurs Pulm: CTAB, no wheezes or rales Ext: 1+ edema to knees bilaterally MSK: no point tenderness Thumbs: bilateral thumbs with mild swelling and erythema around nail bed; blisters at tip of left thumb; no drainage seen     Assessment & Plan:

## 2013-01-04 NOTE — Assessment & Plan Note (Addendum)
A1c 8.8 today. Will increase lantus to 36 units BID. Continue novolog 15 units BID with meals. Follow up with Dr. Raymondo Band in 1 month for additional insulin titration. Will need foot exam at follow up.

## 2013-01-04 NOTE — Patient Instructions (Addendum)
It was nice to see you! I wish you were doing a little better.  Start Nortriptyline 10mg  at bedtime. Use Norco daily as needed for pain. I put in a referral to pain management.  Someone should call you in the next 1-2 weeks.  Increase Lantus to 36 units twice a day.  Take Keflex 1 tablet 3 times a day for 5 days. Use epsom salt soaks for your fingers.  Follow up with me and Dr. Raymondo Band in 1 month.

## 2013-01-04 NOTE — Assessment & Plan Note (Signed)
Bilateral thumbs. Appears to be early infection.  No purulent drainage. Will treat with epsom soaks and 5 day course of keflex. Discussed how to trim nails to prevent recurrence.

## 2013-01-05 NOTE — Telephone Encounter (Signed)
Pt notified.  Letti Towell L, CMA  

## 2013-01-06 ENCOUNTER — Telehealth: Payer: Self-pay | Admitting: Emergency Medicine

## 2013-01-06 MED ORDER — MUPIROCIN 2 % EX OINT: TOPICAL_OINTMENT | Freq: Three times a day (TID) | CUTANEOUS | Status: DC

## 2013-01-06 NOTE — Telephone Encounter (Signed)
Called pt. Left message with 'female' to return call. Please see previous message. Thanks. Lorenda Hatchet, [name]

## 2013-01-06 NOTE — Telephone Encounter (Signed)
Fwd. To PCP for Rx. .Thaison Kolodziejski  

## 2013-01-06 NOTE — Telephone Encounter (Signed)
Pt called. The pharmacy states they have not received the prescripton for Keflex Records indicate it was received by the pharmacy Tues at %pm Please advise

## 2013-01-06 NOTE — Telephone Encounter (Signed)
I believe this is for the paronychia around her thumbs.  I will send in mupirocin ointment to use TID.

## 2013-01-06 NOTE — Telephone Encounter (Signed)
Pt called again because the cream for her fingers is still not at the pharmacy. She has all the other ones and the pharmacy said today 9/18 that they do not have anything sent to them. She is very bad pain and needs this. JW

## 2013-01-12 ENCOUNTER — Encounter: Payer: Self-pay | Admitting: Physical Medicine & Rehabilitation

## 2013-01-14 ENCOUNTER — Other Ambulatory Visit: Payer: Self-pay | Admitting: Emergency Medicine

## 2013-01-14 MED ORDER — METOPROLOL TARTRATE 100 MG PO TABS: 200.0000 mg | ORAL_TABLET | Freq: Two times a day (BID) | ORAL | Status: DC

## 2013-01-19 ENCOUNTER — Other Ambulatory Visit: Payer: Self-pay | Admitting: Ophthalmology

## 2013-01-19 ENCOUNTER — Ambulatory Visit
Admission: RE | Admit: 2013-01-19 | Discharge: 2013-01-19 | Disposition: A | Discharge status: Home / Self Care | Payer: Medicare Other | Source: Ambulatory Visit | Attending: Ophthalmology | Admitting: Ophthalmology

## 2013-01-19 ENCOUNTER — Other Ambulatory Visit: Payer: Medicare Other

## 2013-01-19 DIAGNOSIS — H209 Unspecified iridocyclitis: Secondary | ICD-10-CM

## 2013-01-28 ENCOUNTER — Telehealth: Payer: Self-pay | Admitting: Emergency Medicine

## 2013-01-28 NOTE — Telephone Encounter (Signed)
Form requested to be filled out placed in Dr. Harrel Lemon office.  Alberta Cairns, Darlyne Russian, CMA

## 2013-01-28 NOTE — Telephone Encounter (Signed)
Pt dropped off paperwork to be filled out concerning medical insurance payments.

## 2013-01-31 NOTE — Telephone Encounter (Signed)
Paperwork completed and placed up front for pick up.

## 2013-01-31 NOTE — Telephone Encounter (Signed)
Called pt. No answer, no answering machine. Waiting for call back. Please tell pt, that paperwork is ready for pick up. Thanks. Lorenda Hatchet, [name]

## 2013-02-02 ENCOUNTER — Ambulatory Visit: Payer: Medicare Other | Admitting: Emergency Medicine

## 2013-02-04 NOTE — Telephone Encounter (Signed)
Patient informed that paperwork is ready for pick up

## 2013-02-14 ENCOUNTER — Encounter: Payer: Medicare Other | Attending: Physical Medicine & Rehabilitation | Admitting: Physical Medicine & Rehabilitation

## 2013-02-14 ENCOUNTER — Encounter: Payer: Self-pay | Admitting: Physical Medicine & Rehabilitation

## 2013-02-14 VITALS — BP 132/61 | HR 84 | Resp 14 | Ht 63.0 in | Wt 231.0 lb

## 2013-02-14 DIAGNOSIS — Z5181 Encounter for therapeutic drug level monitoring: Secondary | ICD-10-CM

## 2013-02-14 DIAGNOSIS — F341 Dysthymic disorder: Secondary | ICD-10-CM | POA: Insufficient documentation

## 2013-02-14 DIAGNOSIS — E1149 Type 2 diabetes mellitus with other diabetic neurological complication: Secondary | ICD-10-CM | POA: Insufficient documentation

## 2013-02-14 DIAGNOSIS — IMO0001 Reserved for inherently not codable concepts without codable children: Secondary | ICD-10-CM | POA: Insufficient documentation

## 2013-02-14 DIAGNOSIS — E1165 Type 2 diabetes mellitus with hyperglycemia: Secondary | ICD-10-CM

## 2013-02-14 DIAGNOSIS — M797 Fibromyalgia: Secondary | ICD-10-CM | POA: Insufficient documentation

## 2013-02-14 DIAGNOSIS — F323 Major depressive disorder, single episode, severe with psychotic features: Secondary | ICD-10-CM

## 2013-02-14 DIAGNOSIS — E669 Obesity, unspecified: Secondary | ICD-10-CM | POA: Insufficient documentation

## 2013-02-14 DIAGNOSIS — E1142 Type 2 diabetes mellitus with diabetic polyneuropathy: Secondary | ICD-10-CM

## 2013-02-14 DIAGNOSIS — Z794 Long term (current) use of insulin: Secondary | ICD-10-CM | POA: Insufficient documentation

## 2013-02-14 DIAGNOSIS — Z79899 Other long term (current) drug therapy: Secondary | ICD-10-CM | POA: Insufficient documentation

## 2013-02-14 MED ORDER — KETOROLAC TROMETHAMINE 60 MG/2ML IM SOLN: 60.0000 mg | Freq: Once | INTRAMUSCULAR | Status: DC

## 2013-02-14 MED ORDER — NORTRIPTYLINE HCL 25 MG PO CAPS: 25.0000 mg | ORAL_CAPSULE | Freq: Every day | ORAL | Status: DC

## 2013-02-14 MED ORDER — DULOXETINE HCL 20 MG PO CPEP: 20.0000 mg | ORAL_CAPSULE | Freq: Every day | ORAL | Status: DC

## 2013-02-14 NOTE — Patient Instructions (Signed)
CONTINUE TO WORK ON EXERCISE AND YOUR DIET/DIABETES CONTROL
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
## 2013-03-22 ENCOUNTER — Encounter: Payer: Medicare Other | Attending: Physical Medicine & Rehabilitation | Admitting: Physical Medicine & Rehabilitation

## 2013-03-22 DIAGNOSIS — Z794 Long term (current) use of insulin: Secondary | ICD-10-CM | POA: Insufficient documentation

## 2013-03-22 DIAGNOSIS — F341 Dysthymic disorder: Secondary | ICD-10-CM | POA: Insufficient documentation

## 2013-03-22 DIAGNOSIS — E1149 Type 2 diabetes mellitus with other diabetic neurological complication: Secondary | ICD-10-CM | POA: Insufficient documentation

## 2013-03-22 DIAGNOSIS — IMO0001 Reserved for inherently not codable concepts without codable children: Secondary | ICD-10-CM | POA: Insufficient documentation

## 2013-03-22 DIAGNOSIS — E669 Obesity, unspecified: Secondary | ICD-10-CM | POA: Insufficient documentation

## 2013-03-22 DIAGNOSIS — E1142 Type 2 diabetes mellitus with diabetic polyneuropathy: Secondary | ICD-10-CM | POA: Insufficient documentation

## 2013-03-22 DIAGNOSIS — Z79899 Other long term (current) drug therapy: Secondary | ICD-10-CM | POA: Insufficient documentation

## 2013-05-11 ENCOUNTER — Encounter: Payer: Self-pay | Admitting: Emergency Medicine

## 2013-05-11 ENCOUNTER — Ambulatory Visit (INDEPENDENT_AMBULATORY_CARE_PROVIDER_SITE_OTHER): Payer: Medicare HMO | Admitting: Emergency Medicine

## 2013-05-11 VITALS — BP 123/75 | HR 90 | Ht 63.0 in | Wt 234.0 lb

## 2013-05-11 DIAGNOSIS — R1013 Epigastric pain: Secondary | ICD-10-CM

## 2013-05-11 DIAGNOSIS — L301 Dyshidrosis [pompholyx]: Secondary | ICD-10-CM | POA: Insufficient documentation

## 2013-05-11 DIAGNOSIS — IMO0002 Reserved for concepts with insufficient information to code with codable children: Secondary | ICD-10-CM

## 2013-05-11 DIAGNOSIS — IMO0001 Reserved for inherently not codable concepts without codable children: Secondary | ICD-10-CM

## 2013-05-11 DIAGNOSIS — E1165 Type 2 diabetes mellitus with hyperglycemia: Secondary | ICD-10-CM

## 2013-05-11 DIAGNOSIS — L600 Ingrowing nail: Secondary | ICD-10-CM

## 2013-05-11 DIAGNOSIS — B372 Candidiasis of skin and nail: Secondary | ICD-10-CM | POA: Insufficient documentation

## 2013-05-11 LAB — BASIC METABOLIC PANEL
BUN: 17 mg/dL (ref 6–23)
CO2: 21 mEq/L (ref 19–32)
Calcium: 8.9 mg/dL (ref 8.4–10.5)
Chloride: 101 mEq/L (ref 96–112)
Creat: 0.89 mg/dL (ref 0.50–1.10)
Glucose, Bld: 261 mg/dL — ABNORMAL HIGH (ref 70–99)
Potassium: 4.7 mEq/L (ref 3.5–5.3)
Sodium: 135 mEq/L (ref 135–145)

## 2013-05-11 LAB — HEPATIC FUNCTION PANEL
ALT: 32 U/L (ref 0–35)
AST: 35 U/L (ref 0–37)
Albumin: 4.2 g/dL (ref 3.5–5.2)
Alkaline Phosphatase: 77 U/L (ref 39–117)
Bilirubin, Direct: 0.1 mg/dL (ref 0.0–0.3)
Indirect Bilirubin: 0.3 mg/dL (ref 0.0–0.9)
Total Bilirubin: 0.4 mg/dL (ref 0.3–1.2)
Total Protein: 7.3 g/dL (ref 6.0–8.3)

## 2013-05-11 LAB — LIPASE: Lipase: 39 U/L (ref 0–75)

## 2013-05-11 LAB — POCT GLYCOSYLATED HEMOGLOBIN (HGB A1C): Hemoglobin A1C: 9.7

## 2013-05-11 MED ORDER — NYSTATIN 100000 UNIT/GM EX POWD: Freq: Three times a day (TID) | CUTANEOUS | Status: DC

## 2013-05-11 MED ORDER — CEPHALEXIN 500 MG PO CAPS: 500.0000 mg | ORAL_CAPSULE | Freq: Three times a day (TID) | ORAL | Status: DC

## 2013-05-11 MED ORDER — METOCLOPRAMIDE HCL 10 MG PO TABS: 10.0000 mg | ORAL_TABLET | Freq: Three times a day (TID) | ORAL | Status: DC

## 2013-05-11 MED ORDER — METOPROLOL TARTRATE 100 MG PO TABS: 200.0000 mg | ORAL_TABLET | Freq: Two times a day (BID) | ORAL | Status: DC

## 2013-05-11 MED ORDER — QUETIAPINE FUMARATE 200 MG PO TABS: 100.0000 mg | ORAL_TABLET | Freq: Every day | ORAL | Status: DC

## 2013-05-11 MED ORDER — CLOBETASOL PROPIONATE 0.05 % EX OINT: 1.0000 "application " | TOPICAL_OINTMENT | Freq: Every day | CUTANEOUS | Status: DC

## 2013-05-11 MED ORDER — HYDROMORPHONE HCL 2 MG PO TABS: 2.0000 mg | ORAL_TABLET | Freq: Four times a day (QID) | ORAL | Status: DC | PRN

## 2013-05-11 NOTE — Assessment & Plan Note (Signed)
Right thumb; some drainage present. Continue Epsom salt soaks. Keflex 500mg  TID x10 days.

## 2013-05-11 NOTE — Assessment & Plan Note (Signed)
In pannus. Nystatin powder TID-QID x10 days.

## 2013-05-11 NOTE — Patient Instructions (Addendum)
It was nice to see you!  Thumb - continue epsom salt soaks - take Keflex 3 times a day for 10 days  Hands - apply clobetasol ointment at bedtime - wear gloves when you sleep  Lower belly - apply nystatin powder 3 times a day for 10 days  Upper belly pain - we are checking labs - take the dilaudid every 6 hours as needed for pain  Follow up next week.

## 2013-05-11 NOTE — Progress Notes (Signed)
Subjective:    Patient ID: [name] [name], female    DOB: 10-25-1950, 63 y.o.   MRN: [id number]  HPI [name] [name] is here for a SDA with multiple complaints.  1. Upper abdominal pain: Reports pain across her upper abdomen under her ribs.  States it feels similar to a previous episode of pancreatitis.  She has been nauseous, but not had vomiting.  Her appetite is decreased, but she is tolerating fluids well.  Associated with loose stools, some blood in her stool and tenesmus.  This has been present for about 1 week.  2. Lower abdominal pain: Reports pain and itching under her pannus.  3. Right thumb pain: Reports that her thumb is infected.  It is very painful and draining pus.  She has been doing epsom salt salt soaks and apply witch hazel.  No fevers.  4. Rash on palms: Reports a dry, itchy rash on her palms.  Unknown timeframe.  States it is worse after washing her hands frequently.   Current Outpatient Prescriptions on File Prior to Visit  Medication Sig Dispense Refill  . albuterol (PROVENTIL HFA;VENTOLIN HFA) 108 (90 BASE) MCG/ACT inhaler Inhale 2 puffs into the lungs every 4 (four) hours as needed. For shortness of breath  8.5 g  1  . albuterol (PROVENTIL) (2.5 MG/3ML) 0.083% nebulizer solution Take 3 mLs (2.5 mg total) by nebulization every 6 (six) hours as needed for wheezing.  150 mL  1  . ALREX 0.2 % SUSP       . aspirin EC 81 MG tablet Take 81 mg by mouth every morning.       . Aspirin-Acetaminophen-Caffeine (EXCEDRIN MIGRAINE PO) Take 2 capsules by mouth 3 (three) times daily as needed. For migraine      . atorvastatin (LIPITOR) 20 MG tablet Take 20 mg by mouth every morning.      . beclomethasone (QVAR) 80 MCG/ACT inhaler Inhale 2 puffs into the lungs 2 (two) times daily.  1 Inhaler  12  . diphenhydrAMINE (BENADRYL) 25 mg capsule Take 25 mg by mouth every 6 (six) hours as needed. For itching      . docusate sodium (COLACE) 100 MG capsule Take 100 mg by mouth 2 (two) times  daily.       . DULoxetine (CYMBALTA) 20 MG capsule Take 1 capsule (20 mg total) by mouth daily.  30 capsule  3  . DUREZOL 0.05 % EMUL       . esomeprazole (NEXIUM) 40 MG capsule Take 40 mg by mouth daily before breakfast.      . glucose blood (ONE TOUCH ULTRA TEST) test strip Use 4 times daily.  100 each  12  . hydrochlorothiazide (HYDRODIURIL) 25 MG tablet Take 25 mg by mouth every morning.      Marland Kitchen HYDROcodone-acetaminophen (NORCO) 10-325 MG per tablet Take 1 tablet by mouth daily as needed for pain.  30 tablet  0  . hydrOXYzine (ATARAX/VISTARIL) 10 MG tablet       . Hypromellose (ARTIFICIAL TEARS OP) Place 1 drop into both eyes daily as needed. For dry eyes      . insulin aspart (NOVOLOG) 100 UNIT/ML injection Inject 15 Units into the skin 2 (two) times daily.  1 vial  11  . insulin glargine (LANTUS) 100 UNIT/ML injection Inject 0.36 mLs (36 Units total) into the skin 2 (two) times daily. Dispense Pens sufficient for 1 month supply  4 pen  11  . Insulin Pen Needle 31G X 8 MM MISC  1 Container by Does not apply route as needed (Dispense Quantity sufficient for 5 injections daily (lantus and novolog pens)).  1 each  prn  . ketorolac (TORADOL) 10 MG tablet       . Lancets (ONETOUCH ULTRASOFT) lancets Use 4 times daily to check cbg  100 each  12  . levocetirizine (XYZAL) 5 MG tablet Take 1 tablet (5 mg total) by mouth every evening.  30 tablet  3  . lisinopril (PRINIVIL,ZESTRIL) 40 MG tablet Take 1 tablet (40 mg total) by mouth daily.  30 tablet  6  . montelukast (SINGULAIR) 10 MG tablet take 1 tablet by mouth at bedtime  90 tablet  1  . mupirocin ointment (BACTROBAN) 2 % Apply topically 3 (three) times daily.  22 g  0  . nicotine polacrilex (NICORETTE) 2 MG gum Take 2 mg by mouth daily as needed. Use as needed for craving.   Use up to 10 pieces per day.      . nortriptyline (PAMELOR) 25 MG capsule Take 1 capsule (25 mg total) by mouth at bedtime.  30 capsule  3  . nystatin (MYCOSTATIN) 100000  UNIT/ML suspension Take 500,000 Units by mouth 2 (two) times daily. Swish and swallow      . nystatin cream (MYCOSTATIN) Apply 1 application topically 2 (two) times daily as needed. For rash      . olopatadine (PATANOL) 0.1 % ophthalmic solution Place 1 drop into both eyes 2 (two) times daily.  5 mL  12  . ondansetron (ZOFRAN) 4 MG tablet Take 1 tablet (4 mg total) by mouth every 8 (eight) hours as needed. For nausea  20 tablet  2  . pentosan polysulfate (ELMIRON) 100 MG capsule Take 1 capsule (100 mg total) by mouth 3 (three) times daily before meals.  270 capsule  1  . phenazopyridine (PYRIDIUM) 200 MG tablet Take 200 mg by mouth every 8 (eight) hours.       . polyethylene glycol (GLYCOLAX) packet Take 17 g by mouth every 3 (three) days. Mix in 4-6 oz of water      . prednisoLONE acetate (PRED FORTE) 1 % ophthalmic suspension Place 1 drop into both eyes 2 (two) times daily.       . predniSONE (DELTASONE) 10 MG tablet Take 5 pills daily x5 days, then 4 pills x2 days, then 3 pills x2 days, then 2 pills x2 days, then 1 pill x2 days  45 tablet  0  . pregabalin (LYRICA) 150 MG capsule Take 1 capsule (150 mg total) by mouth 2 (two) times daily.  60 capsule  3  . promethazine (PHENERGAN) 25 MG tablet Take 1 tablet (25 mg total) by mouth every 6 (six) hours as needed for nausea.  12 tablet  0  . ranitidine (ZANTAC) 150 MG tablet Take 150 mg by mouth daily as needed. For acid reflux      . spironolactone (ALDACTONE) 25 MG tablet Take 1 tablet (25 mg total) by mouth daily.  30 tablet  3   No current facility-administered medications on file prior to visit.    I have reviewed and updated the following as appropriate: allergies and current medications SHx: current smoker   Review of Systems See HPI    Objective:   Physical Exam BP 123/75  Pulse 90  Ht 5\' 3"  (1.6 m)  Wt 234 lb (106.142 kg)  BMI 41.46 kg/m2 Gen: alert, cooperative, NAD HEENT: AT/[place], sclera white, MMM Abd: +BS, soft, ND, mild  tenderness  across upper abdomen worse in the epigastric; no rebound or guarding Skin: erythematous rash in pannus folds; bilateral palms with patches of dry scaly skin with scattered papules Right thumb: ingrown nail present; mild swelling at medial nail fold with some erythema; able to express small amount of yellowish drainage; tender     Assessment & Plan:

## 2013-05-11 NOTE — Assessment & Plan Note (Signed)
Bilateral palms. Clobetasol ointment at night; wear gloves over the ointment.

## 2013-05-11 NOTE — Assessment & Plan Note (Signed)
History concerned for possible recurrent pancreatitis. Tolerating PO okay currently. Will check CMP and lipase. Dilaudid 2mg  PO #15 tablets given. Follow up next week.

## 2013-05-12 ENCOUNTER — Telehealth: Payer: Self-pay | Admitting: Emergency Medicine

## 2013-05-12 DIAGNOSIS — IMO0002 Reserved for concepts with insufficient information to code with codable children: Secondary | ICD-10-CM

## 2013-05-12 DIAGNOSIS — E1165 Type 2 diabetes mellitus with hyperglycemia: Secondary | ICD-10-CM

## 2013-05-12 MED ORDER — INSULIN GLARGINE 100 UNIT/ML ~~LOC~~ SOLN: 36.0000 [IU] | Freq: Two times a day (BID) | SUBCUTANEOUS | Status: DC

## 2013-05-12 NOTE — Telephone Encounter (Signed)
Patient completely out of Lantus. Please send refills as soon as possible.

## 2013-05-12 NOTE — Telephone Encounter (Signed)
Will fwd. To PCP. .[name] [name]  

## 2013-05-12 NOTE — Telephone Encounter (Signed)
Lantus sent to pharmacy on file.

## 2013-05-13 NOTE — Telephone Encounter (Signed)
Called pt.informed. .[name] [name]  

## 2013-05-17 ENCOUNTER — Telehealth: Payer: Self-pay | Admitting: Emergency Medicine

## 2013-05-17 DIAGNOSIS — IMO0002 Reserved for concepts with insufficient information to code with codable children: Secondary | ICD-10-CM

## 2013-05-17 DIAGNOSIS — E1165 Type 2 diabetes mellitus with hyperglycemia: Secondary | ICD-10-CM

## 2013-05-17 NOTE — Telephone Encounter (Signed)
Pt called because she said that the Lantus vile's were called in and she uses the epi pens. Can we correct this right away. jw

## 2013-05-17 NOTE — Telephone Encounter (Signed)
Will FWD to MD.  [name] [name], CMA  

## 2013-05-18 ENCOUNTER — Encounter: Payer: Self-pay | Admitting: Emergency Medicine

## 2013-05-18 ENCOUNTER — Ambulatory Visit (INDEPENDENT_AMBULATORY_CARE_PROVIDER_SITE_OTHER): Payer: Medicare HMO | Admitting: Emergency Medicine

## 2013-05-18 VITALS — BP 126/76 | HR 81 | Temp 98.6°F | Ht 63.0 in | Wt 235.0 lb

## 2013-05-18 DIAGNOSIS — L600 Ingrowing nail: Secondary | ICD-10-CM

## 2013-05-18 DIAGNOSIS — L301 Dyshidrosis [pompholyx]: Secondary | ICD-10-CM

## 2013-05-18 DIAGNOSIS — R1013 Epigastric pain: Secondary | ICD-10-CM

## 2013-05-18 MED ORDER — ERYTHROMYCIN BASE 250 MG PO TABS: 250.0000 mg | ORAL_TABLET | Freq: Three times a day (TID) | ORAL | Status: DC

## 2013-05-18 MED ORDER — INSULIN GLARGINE 100 UNIT/ML SOLOSTAR PEN: 36.0000 [IU] | PEN_INJECTOR | Freq: Two times a day (BID) | SUBCUTANEOUS | Status: DC

## 2013-05-18 NOTE — Telephone Encounter (Signed)
I have sent lantus pens to her pharmacy.

## 2013-05-18 NOTE — Patient Instructions (Addendum)
It was nice to see you!  We are going to try adding erythromycin 250mg  before meals to see if that will help with your stomach. I sent in enough for a 2-week trial.   I this does not help, I think we need to have you see the GI specialists so they can look in your stomach and throat with a camera.  We have scheduled you for an appointment to remove your thumb nail.   It will be next Thursday at 9:30 am.  I will see you back in 1-2 months or sooner if needed.

## 2013-05-18 NOTE — Assessment & Plan Note (Addendum)
Bilateral thumbs.  Repeat infections. Not responding to keflex. Extensive discussion about nail removal. She is apprehensive about the digital block - both getting it and having it work. Will schedule with Dr. Nori Riis in [facility] next week.  If unable to achieve adequate anesthesia will need to consider referral for general anesthesia.  I will do my best to join Dr. Nori Riis for this procedure.

## 2013-05-18 NOTE — Progress Notes (Signed)
Subjective:    Patient ID: [name] [name], female    DOB: 19-Sep-1950, 63 y.o.   MRN: [id number]  HPI [name] [name] is here for for f/u of abdominal pain.  1. Abdominal pain: She reports she continues to have bad epigastric pain.  No real change from last week.  The dilaudid did "take the edge off."  No vomiting or hematemesis.  She does not think she has had a EGD.  Does have known gastroparesis.  2. Thumb infection: She reports continued pain and pus from the right ingrown thumb nail.  She is still on the Keflex.   Current Outpatient Prescriptions on File Prior to Visit  Medication Sig Dispense Refill  . albuterol (PROVENTIL HFA;VENTOLIN HFA) 108 (90 BASE) MCG/ACT inhaler Inhale 2 puffs into the lungs every 4 (four) hours as needed. For shortness of breath  8.5 g  1  . albuterol (PROVENTIL) (2.5 MG/3ML) 0.083% nebulizer solution Take 3 mLs (2.5 mg total) by nebulization every 6 (six) hours as needed for wheezing.  150 mL  1  . ALREX 0.2 % SUSP       . aspirin EC 81 MG tablet Take 81 mg by mouth every morning.       . Aspirin-Acetaminophen-Caffeine (EXCEDRIN MIGRAINE PO) Take 2 capsules by mouth 3 (three) times daily as needed. For migraine      . atorvastatin (LIPITOR) 20 MG tablet Take 20 mg by mouth every morning.      . beclomethasone (QVAR) 80 MCG/ACT inhaler Inhale 2 puffs into the lungs 2 (two) times daily.  1 Inhaler  12  . cephALEXin (KEFLEX) 500 MG capsule Take 1 capsule (500 mg total) by mouth 3 (three) times daily.  30 capsule  0  . clobetasol ointment (TEMOVATE) 6.27 % Apply 1 application topically at bedtime. To palms  30 g  0  . diphenhydrAMINE (BENADRYL) 25 mg capsule Take 25 mg by mouth every 6 (six) hours as needed. For itching      . docusate sodium (COLACE) 100 MG capsule Take 100 mg by mouth 2 (two) times daily.       . DULoxetine (CYMBALTA) 20 MG capsule Take 1 capsule (20 mg total) by mouth daily.  30 capsule  3  . DUREZOL 0.05 % EMUL       . esomeprazole  (NEXIUM) 40 MG capsule Take 40 mg by mouth daily before breakfast.      . glucose blood (ONE TOUCH ULTRA TEST) test strip Use 4 times daily.  100 each  12  . hydrochlorothiazide (HYDRODIURIL) 25 MG tablet Take 25 mg by mouth every morning.      Marland Kitchen HYDROcodone-acetaminophen (NORCO) 10-325 MG per tablet Take 1 tablet by mouth daily as needed for pain.  30 tablet  0  . HYDROmorphone (DILAUDID) 2 MG tablet Take 1 tablet (2 mg total) by mouth every 6 (six) hours as needed for severe pain.  15 tablet  0  . hydrOXYzine (ATARAX/VISTARIL) 10 MG tablet       . Hypromellose (ARTIFICIAL TEARS OP) Place 1 drop into both eyes daily as needed. For dry eyes      . insulin aspart (NOVOLOG) 100 UNIT/ML injection Inject 15 Units into the skin 2 (two) times daily.  1 vial  11  . Insulin Glargine (LANTUS SOLOSTAR) 100 UNIT/ML Solostar Pen Inject 36 Units into the skin 2 (two) times daily.  8 pen  11  . Insulin Pen Needle 31G X 8 MM MISC 1 Container by  Does not apply route as needed (Dispense Quantity sufficient for 5 injections daily (lantus and novolog pens)).  1 each  prn  . ketorolac (TORADOL) 10 MG tablet       . Lancets (ONETOUCH ULTRASOFT) lancets Use 4 times daily to check cbg  100 each  12  . levocetirizine (XYZAL) 5 MG tablet Take 1 tablet (5 mg total) by mouth every evening.  30 tablet  3  . lisinopril (PRINIVIL,ZESTRIL) 40 MG tablet Take 1 tablet (40 mg total) by mouth daily.  30 tablet  6  . metoCLOPramide (REGLAN) 10 MG tablet Take 1 tablet (10 mg total) by mouth 3 (three) times daily before meals.  90 tablet  3  . metoprolol (LOPRESSOR) 100 MG tablet Take 2 tablets (200 mg total) by mouth 2 (two) times daily.  120 tablet  6  . montelukast (SINGULAIR) 10 MG tablet take 1 tablet by mouth at bedtime  90 tablet  1  . mupirocin ointment (BACTROBAN) 2 % Apply topically 3 (three) times daily.  22 g  0  . nicotine polacrilex (NICORETTE) 2 MG gum Take 2 mg by mouth daily as needed. Use as needed for craving.   Use  up to 10 pieces per day.      . nortriptyline (PAMELOR) 25 MG capsule Take 1 capsule (25 mg total) by mouth at bedtime.  30 capsule  3  . nystatin (MYCOSTATIN) 100000 UNIT/ML suspension Take 500,000 Units by mouth 2 (two) times daily. Swish and swallow      . nystatin (MYCOSTATIN) powder Apply topically 3 (three) times daily. To pannus for 2 weeks.  30 g  0  . nystatin cream (MYCOSTATIN) Apply 1 application topically 2 (two) times daily as needed. For rash      . olopatadine (PATANOL) 0.1 % ophthalmic solution Place 1 drop into both eyes 2 (two) times daily.  5 mL  12  . ondansetron (ZOFRAN) 4 MG tablet Take 1 tablet (4 mg total) by mouth every 8 (eight) hours as needed. For nausea  20 tablet  2  . pentosan polysulfate (ELMIRON) 100 MG capsule Take 1 capsule (100 mg total) by mouth 3 (three) times daily before meals.  270 capsule  1  . phenazopyridine (PYRIDIUM) 200 MG tablet Take 200 mg by mouth every 8 (eight) hours.       . polyethylene glycol (GLYCOLAX) packet Take 17 g by mouth every 3 (three) days. Mix in 4-6 oz of water      . prednisoLONE acetate (PRED FORTE) 1 % ophthalmic suspension Place 1 drop into both eyes 2 (two) times daily.       . predniSONE (DELTASONE) 10 MG tablet Take 5 pills daily x5 days, then 4 pills x2 days, then 3 pills x2 days, then 2 pills x2 days, then 1 pill x2 days  45 tablet  0  . pregabalin (LYRICA) 150 MG capsule Take 1 capsule (150 mg total) by mouth 2 (two) times daily.  60 capsule  3  . promethazine (PHENERGAN) 25 MG tablet Take 1 tablet (25 mg total) by mouth every 6 (six) hours as needed for nausea.  12 tablet  0  . QUEtiapine (SEROQUEL) 200 MG tablet Take 0.5 tablets (100 mg total) by mouth at bedtime.  30 tablet  6  . ranitidine (ZANTAC) 150 MG tablet Take 150 mg by mouth daily as needed. For acid reflux      . spironolactone (ALDACTONE) 25 MG tablet Take 1 tablet (25 mg total)  by mouth daily.  30 tablet  3   No current facility-administered medications on  file prior to visit.    I have reviewed and updated the following as appropriate: allergies and current medications SHx: current smoker   Review of Systems See HPI    Objective:   Physical Exam BP 126/76  Pulse 81  Temp(Src) 98.6 F (37 C) (Oral)  Ht 5\' 3"  (1.6 m)  Wt 235 lb (106.595 kg)  BMI 41.64 kg/m2 Gen: alert, cooperative, mild distress Abd: +BS, soft, ND, tender in epigastric Thumbs: bilateral thumbs with thickened and ingrown nails; right thumb with some swelling and drainage     Assessment & Plan:

## 2013-05-18 NOTE — Assessment & Plan Note (Addendum)
Lab work last week was normal. No improvement. ?gastroparesis contributing. Already on reglan. Will add erythromycin 250mg  before meals x2 weeks. F/u in 2 weeks.  If no improvement will likely need GI referral.

## 2013-05-18 NOTE — Assessment & Plan Note (Signed)
Improving. Continue clobetasol ointment.

## 2013-05-26 ENCOUNTER — Ambulatory Visit (INDEPENDENT_AMBULATORY_CARE_PROVIDER_SITE_OTHER): Payer: Commercial Managed Care - HMO | Admitting: Family Medicine

## 2013-05-26 ENCOUNTER — Encounter: Payer: Self-pay | Admitting: Family Medicine

## 2013-05-26 VITALS — BP 157/64 | HR 81 | Temp 99.4°F

## 2013-05-26 DIAGNOSIS — E1149 Type 2 diabetes mellitus with other diabetic neurological complication: Secondary | ICD-10-CM

## 2013-05-26 DIAGNOSIS — E1142 Type 2 diabetes mellitus with diabetic polyneuropathy: Secondary | ICD-10-CM

## 2013-05-26 DIAGNOSIS — L600 Ingrowing nail: Secondary | ICD-10-CM

## 2013-05-26 MED ORDER — HYDROCODONE-ACETAMINOPHEN 5-325 MG PO TABS: 1.0000 | ORAL_TABLET | Freq: Four times a day (QID) | ORAL | Status: DC | PRN

## 2013-05-26 NOTE — Patient Instructions (Addendum)
You may remove the bulky dressing on her thumb tonight. Replace that with a Band-Aid. Each day take the Band-Aid off, wash her hands assessment water and then use a little bit of hydroperoxide or alcohol just on the area of the thumbnail. At that driving covered with another Band-Aid. If you have any increase in pain, swelling or redness of the finger, discharge such as pus or fever, please let us know immediately. After 3-4 days of wearing a Band-Aid for protection you can discontinue that and you  will have no more restrictions.

## 2013-05-26 NOTE — Progress Notes (Signed)
Patient ID: [name] [name], female   DOB: Aug 29, 1950, 62 y.o.   MRN: [id number] Patient here for right fingernail removal. She's had recurrent ingrown infected nails. She has significant nail deformity of both thumbs. There is no current infection. PROCEDURE NOTE: Patient given informed consent and there is a signed copy in chart. Appropriate time out was taken for toenail removal on the right thumb. Area prepped and draped in usual sterile fashion. A large thick rubber band was used for tourniquet. 5 cc total of 2% lidocaine without epinephrine was used for digital block. After anesthesia was obtained, a nail elevator was used to elevate the nail, it was grasped by forceps and then removed. The nail bed was treated with phenol and then copiously washed with alcohol swabs. There is less than 1 cc blood loss. Patient tolerated the procedure extremely well. Placed a dressing on the thumb which she can remove tonight and then use a Band-Aid for the next 2-3 days. I gave her #20 Vicodin which I doubt she'll need that she has them on hand. She can followup with her PCP or to me if she desires to have the other fingernail on the left thumb removed. He is also quite deformed.  Signs of infection and post procedure care for thumb which includes daily bandage changes for the next 3-4 days using a Band-Aid were discussed. We also discussed red flags to watch out for such his increasing pain in the thumb, drainage of pus, swelling or redness or fever.

## 2013-05-31 ENCOUNTER — Telehealth: Payer: Self-pay | Admitting: Emergency Medicine

## 2013-05-31 NOTE — Telephone Encounter (Signed)
Had allergic reaction to hydro codone. She is now itching. She cannot take hydro codone or tramadol. Please advise. She is still hurting and itching

## 2013-05-31 NOTE — Telephone Encounter (Signed)
I did teh nail removal last WEEK so if she has made it until now without taking any pain medicine, she probably is out of the pain period. If she has any other questions about pain meds, she needs to see her regular provider, Dr. Bridgett Larsson. THANKS! Dorcas Mcmurray

## 2013-05-31 NOTE — Telephone Encounter (Signed)
Called pt and informed of Dr.Neal's message.  Also put note in allergies re: Hydrocodone and Tramadol .[name] [name]

## 2013-05-31 NOTE — Telephone Encounter (Signed)
Will fwd. To Dr.Neal (rx'd at last OV) .Mauricia Area

## 2013-06-03 ENCOUNTER — Encounter: Payer: Self-pay | Admitting: Emergency Medicine

## 2013-06-03 ENCOUNTER — Ambulatory Visit (INDEPENDENT_AMBULATORY_CARE_PROVIDER_SITE_OTHER): Payer: Medicare HMO | Admitting: Emergency Medicine

## 2013-06-03 VITALS — BP 139/81 | HR 83 | Temp 99.2°F | Ht 63.0 in | Wt 235.0 lb

## 2013-06-03 DIAGNOSIS — L600 Ingrowing nail: Secondary | ICD-10-CM

## 2013-06-03 MED ORDER — OXYCODONE-ACETAMINOPHEN 5-325 MG PO TABS: 1.0000 | ORAL_TABLET | Freq: Three times a day (TID) | ORAL | Status: DC | PRN

## 2013-06-03 MED ORDER — SULFAMETHOXAZOLE-TMP DS 800-160 MG PO TABS: 1.0000 | ORAL_TABLET | Freq: Two times a day (BID) | ORAL | Status: DC

## 2013-06-03 NOTE — Patient Instructions (Signed)
It was nice to see you!  I know the thumb still hurts, but it looks much better than it did before.  Take Bactrim 1 pill twice a day for 10 days. Use percocet every 6 hours as needed for pain.  Take it with a bendaryl.  Follow up in 2 weeks.

## 2013-06-03 NOTE — Assessment & Plan Note (Signed)
Right thumb looks much better; still with small area of possible infection. Bactrim BID x10 days. Given percocet to take as needed for pain - recommended to take with benadryl. Discussed that pain will improve as infection resolves. F/u in 2 weeks.

## 2013-06-03 NOTE — Progress Notes (Signed)
Subjective:    Patient ID: [name] [name], female    DOB: 1950-05-27, 63 y.o.   MRN: [id number]  HPI [name] [name] is here for f/u right thumbnail removal.  She reports that the thumb is still sore, particularly at the lateral edge.  Still seeing some drainage from that area as well.  She could not take the norco secondary to itching.  Current Outpatient Prescriptions on File Prior to Visit  Medication Sig Dispense Refill  . albuterol (PROVENTIL HFA;VENTOLIN HFA) 108 (90 BASE) MCG/ACT inhaler Inhale 2 puffs into the lungs every 4 (four) hours as needed. For shortness of breath  8.5 g  1  . albuterol (PROVENTIL) (2.5 MG/3ML) 0.083% nebulizer solution Take 3 mLs (2.5 mg total) by nebulization every 6 (six) hours as needed for wheezing.  150 mL  1  . ALREX 0.2 % SUSP       . aspirin EC 81 MG tablet Take 81 mg by mouth every morning.       . Aspirin-Acetaminophen-Caffeine (EXCEDRIN MIGRAINE PO) Take 2 capsules by mouth 3 (three) times daily as needed. For migraine      . atorvastatin (LIPITOR) 20 MG tablet Take 20 mg by mouth every morning.      . beclomethasone (QVAR) 80 MCG/ACT inhaler Inhale 2 puffs into the lungs 2 (two) times daily.  1 Inhaler  12  . cephALEXin (KEFLEX) 500 MG capsule Take 1 capsule (500 mg total) by mouth 3 (three) times daily.  30 capsule  0  . clobetasol ointment (TEMOVATE) 4.19 % Apply 1 application topically at bedtime. To palms  30 g  0  . diphenhydrAMINE (BENADRYL) 25 mg capsule Take 25 mg by mouth every 6 (six) hours as needed. For itching      . docusate sodium (COLACE) 100 MG capsule Take 100 mg by mouth 2 (two) times daily.       . DULoxetine (CYMBALTA) 20 MG capsule Take 1 capsule (20 mg total) by mouth daily.  30 capsule  3  . DUREZOL 0.05 % EMUL       . erythromycin (E-MYCIN) 250 MG tablet Take 1 tablet (250 mg total) by mouth 3 (three) times daily before meals.  32 tablet  0  . esomeprazole (NEXIUM) 40 MG capsule Take 40 mg by mouth daily before  breakfast.      . glucose blood (ONE TOUCH ULTRA TEST) test strip Use 4 times daily.  100 each  12  . hydrochlorothiazide (HYDRODIURIL) 25 MG tablet Take 25 mg by mouth every morning.      Marland Kitchen HYDROcodone-acetaminophen (NORCO) 10-325 MG per tablet Take 1 tablet by mouth daily as needed for pain.  30 tablet  0  . HYDROcodone-acetaminophen (NORCO/VICODIN) 5-325 MG per tablet Take 1-2 tablets by mouth every 6 (six) hours as needed for moderate pain.  20 tablet  0  . hydrOXYzine (ATARAX/VISTARIL) 10 MG tablet       . Hypromellose (ARTIFICIAL TEARS OP) Place 1 drop into both eyes daily as needed. For dry eyes      . insulin aspart (NOVOLOG) 100 UNIT/ML injection Inject 15 Units into the skin 2 (two) times daily.  1 vial  11  . Insulin Glargine (LANTUS SOLOSTAR) 100 UNIT/ML Solostar Pen Inject 36 Units into the skin 2 (two) times daily.  8 pen  11  . Insulin Pen Needle 31G X 8 MM MISC 1 Container by Does not apply route as needed (Dispense Quantity sufficient for 5 injections daily (lantus and  novolog pens)).  1 each  prn  . ketorolac (TORADOL) 10 MG tablet       . Lancets (ONETOUCH ULTRASOFT) lancets Use 4 times daily to check cbg  100 each  12  . levocetirizine (XYZAL) 5 MG tablet Take 1 tablet (5 mg total) by mouth every evening.  30 tablet  3  . lisinopril (PRINIVIL,ZESTRIL) 40 MG tablet Take 1 tablet (40 mg total) by mouth daily.  30 tablet  6  . metoCLOPramide (REGLAN) 10 MG tablet Take 1 tablet (10 mg total) by mouth 3 (three) times daily before meals.  90 tablet  3  . metoprolol (LOPRESSOR) 100 MG tablet Take 2 tablets (200 mg total) by mouth 2 (two) times daily.  120 tablet  6  . montelukast (SINGULAIR) 10 MG tablet take 1 tablet by mouth at bedtime  90 tablet  1  . mupirocin ointment (BACTROBAN) 2 % Apply topically 3 (three) times daily.  22 g  0  . nicotine polacrilex (NICORETTE) 2 MG gum Take 2 mg by mouth daily as needed. Use as needed for craving.   Use up to 10 pieces per day.      .  nortriptyline (PAMELOR) 25 MG capsule Take 1 capsule (25 mg total) by mouth at bedtime.  30 capsule  3  . nystatin (MYCOSTATIN) 100000 UNIT/ML suspension Take 500,000 Units by mouth 2 (two) times daily. Swish and swallow      . nystatin (MYCOSTATIN) powder Apply topically 3 (three) times daily. To pannus for 2 weeks.  30 g  0  . nystatin cream (MYCOSTATIN) Apply 1 application topically 2 (two) times daily as needed. For rash      . olopatadine (PATANOL) 0.1 % ophthalmic solution Place 1 drop into both eyes 2 (two) times daily.  5 mL  12  . ondansetron (ZOFRAN) 4 MG tablet Take 1 tablet (4 mg total) by mouth every 8 (eight) hours as needed. For nausea  20 tablet  2  . pentosan polysulfate (ELMIRON) 100 MG capsule Take 1 capsule (100 mg total) by mouth 3 (three) times daily before meals.  270 capsule  1  . phenazopyridine (PYRIDIUM) 200 MG tablet Take 200 mg by mouth every 8 (eight) hours.       . polyethylene glycol (GLYCOLAX) packet Take 17 g by mouth every 3 (three) days. Mix in 4-6 oz of water      . prednisoLONE acetate (PRED FORTE) 1 % ophthalmic suspension Place 1 drop into both eyes 2 (two) times daily.       . predniSONE (DELTASONE) 10 MG tablet Take 5 pills daily x5 days, then 4 pills x2 days, then 3 pills x2 days, then 2 pills x2 days, then 1 pill x2 days  45 tablet  0  . pregabalin (LYRICA) 150 MG capsule Take 1 capsule (150 mg total) by mouth 2 (two) times daily.  60 capsule  3  . promethazine (PHENERGAN) 25 MG tablet Take 1 tablet (25 mg total) by mouth every 6 (six) hours as needed for nausea.  12 tablet  0  . QUEtiapine (SEROQUEL) 200 MG tablet Take 0.5 tablets (100 mg total) by mouth at bedtime.  30 tablet  6  . ranitidine (ZANTAC) 150 MG tablet Take 150 mg by mouth daily as needed. For acid reflux      . spironolactone (ALDACTONE) 25 MG tablet Take 1 tablet (25 mg total) by mouth daily.  30 tablet  3   No current facility-administered medications on  file prior to visit.    I have  reviewed and updated the following as appropriate: allergies and current medications SHx: current smoker   Review of Systems See HPI    Objective:   Physical Exam BP 139/81  Pulse 83  Temp(Src) 99.2 F (37.3 C) (Oral)  Ht 5\' 3"  (1.6 m)  Wt 235 lb (106.595 kg)  BMI 41.64 kg/m2 Gen: alert, cooperative, NAD Right thumb: nail removed; nail bed looks clean, no active bleeding; area of discoloration and tenderness at lateral edge; unable to express any drainage      Assessment & Plan:

## 2013-06-08 ENCOUNTER — Telehealth: Payer: Self-pay | Admitting: Emergency Medicine

## 2013-06-08 ENCOUNTER — Other Ambulatory Visit: Payer: Self-pay | Admitting: Emergency Medicine

## 2013-06-08 MED ORDER — NYSTATIN 100000 UNIT/GM EX POWD: Freq: Three times a day (TID) | CUTANEOUS | Status: DC

## 2013-06-08 MED ORDER — LISINOPRIL 40 MG PO TABS: 40.0000 mg | ORAL_TABLET | Freq: Every day | ORAL | Status: DC

## 2013-06-08 NOTE — Telephone Encounter (Signed)
Refills sent to [facility].

## 2013-06-08 NOTE — Telephone Encounter (Signed)
Needs refill on lisinopril and nystop Pharmacy: [facility] on [address]. Has been out of lisinopril for 2 days

## 2013-06-08 NOTE — Telephone Encounter (Signed)
Will fwd to MD.  Nelvin Tomb L, CMA  

## 2013-06-09 NOTE — Telephone Encounter (Signed)
Pt notified.  Chistian Kasler L, CMA  

## 2013-06-10 ENCOUNTER — Ambulatory Visit: Payer: Medicare HMO | Admitting: Emergency Medicine

## 2013-06-10 ENCOUNTER — Other Ambulatory Visit: Payer: Self-pay | Admitting: Emergency Medicine

## 2013-06-10 MED ORDER — LISINOPRIL 40 MG PO TABS: 40.0000 mg | ORAL_TABLET | Freq: Every day | ORAL | Status: DC

## 2013-06-17 ENCOUNTER — Ambulatory Visit: Payer: Medicare HMO | Admitting: Emergency Medicine

## 2013-06-30 ENCOUNTER — Ambulatory Visit (INDEPENDENT_AMBULATORY_CARE_PROVIDER_SITE_OTHER): Payer: Medicare HMO | Admitting: Emergency Medicine

## 2013-06-30 ENCOUNTER — Encounter: Payer: Self-pay | Admitting: Emergency Medicine

## 2013-06-30 VITALS — BP 160/90 | HR 102 | Temp 98.8°F | Ht 63.0 in | Wt 232.0 lb

## 2013-06-30 DIAGNOSIS — J209 Acute bronchitis, unspecified: Secondary | ICD-10-CM | POA: Insufficient documentation

## 2013-06-30 DIAGNOSIS — L600 Ingrowing nail: Secondary | ICD-10-CM

## 2013-06-30 MED ORDER — HYDROCORTISONE 0.5 % EX CREA: 1.0000 "application " | TOPICAL_CREAM | Freq: Two times a day (BID) | CUTANEOUS | Status: DC

## 2013-06-30 MED ORDER — LIDOCAINE-PRILOCAINE 2.5-2.5 % EX CREA: 1.0000 "application " | TOPICAL_CREAM | CUTANEOUS | Status: DC | PRN

## 2013-06-30 MED ORDER — LEVOFLOXACIN 500 MG PO TABS: 500.0000 mg | ORAL_TABLET | Freq: Every day | ORAL | Status: DC

## 2013-06-30 NOTE — Assessment & Plan Note (Signed)
Appears to be healing well. Nail bed is a little dry. Will send in EMLA cream to help with moisture and pain.

## 2013-06-30 NOTE — Progress Notes (Signed)
Subjective:    Patient ID: [name] [name], female    DOB: [birth year], 63 y.o.   MRN: [id number]  HPI [name] [name] is here for f/u right thumb and cough.  Right thumb S/p nail removal.  She states it continues to be very sore and is concerned that it is not healing well.  Cough Present for last 10 days, getting worse.  Productive of green sputum.  Associated with mild shortness of breath, nasal congestion, sinus pain.  Also has some anterior and posterior chest pains with coughing and deep breaths.  Reports getting an itchy rash on her face and arms shortly after the cough started.  Current Outpatient Prescriptions on File Prior to Visit  Medication Sig Dispense Refill  . albuterol (PROVENTIL HFA;VENTOLIN HFA) 108 (90 BASE) MCG/ACT inhaler Inhale 2 puffs into the lungs every 4 (four) hours as needed. For shortness of breath  8.5 g  1  . albuterol (PROVENTIL) (2.5 MG/3ML) 0.083% nebulizer solution Take 3 mLs (2.5 mg total) by nebulization every 6 (six) hours as needed for wheezing.  150 mL  1  . ALREX 0.2 % SUSP       . aspirin EC 81 MG tablet Take 81 mg by mouth every morning.       . Aspirin-Acetaminophen-Caffeine (EXCEDRIN MIGRAINE PO) Take 2 capsules by mouth 3 (three) times daily as needed. For migraine      . atorvastatin (LIPITOR) 20 MG tablet Take 20 mg by mouth every morning.      . beclomethasone (QVAR) 80 MCG/ACT inhaler Inhale 2 puffs into the lungs 2 (two) times daily.  1 Inhaler  12  . cephALEXin (KEFLEX) 500 MG capsule Take 1 capsule (500 mg total) by mouth 3 (three) times daily.  30 capsule  0  . clobetasol ointment (TEMOVATE) 0.05 % Apply 1 application topically at bedtime. To palms  30 g  0  . diphenhydrAMINE (BENADRYL) 25 mg capsule Take 25 mg by mouth every 6 (six) hours as needed. For itching      . docusate sodium (COLACE) 100 MG capsule Take 100 mg by mouth 2 (two) times daily.       . DULoxetine (CYMBALTA) 20 MG capsule Take 1 capsule (20 mg total) by mouth  daily.  30 capsule  3  . DUREZOL 0.05 % EMUL       . erythromycin (E-MYCIN) 250 MG tablet Take 1 tablet (250 mg total) by mouth 3 (three) times daily before meals.  32 tablet  0  . esomeprazole (NEXIUM) 40 MG capsule Take 40 mg by mouth daily before breakfast.      . glucose blood (ONE TOUCH ULTRA TEST) test strip Use 4 times daily.  100 each  12  . hydrochlorothiazide (HYDRODIURIL) 25 MG tablet Take 25 mg by mouth every morning.      Marland Kitchen HYDROcodone-acetaminophen (NORCO) 10-325 MG per tablet Take 1 tablet by mouth daily as needed for pain.  30 tablet  0  . HYDROcodone-acetaminophen (NORCO/VICODIN) 5-325 MG per tablet Take 1-2 tablets by mouth every 6 (six) hours as needed for moderate pain.  20 tablet  0  . hydrOXYzine (ATARAX/VISTARIL) 10 MG tablet       . Hypromellose (ARTIFICIAL TEARS OP) Place 1 drop into both eyes daily as needed. For dry eyes      . insulin aspart (NOVOLOG) 100 UNIT/ML injection Inject 15 Units into the skin 2 (two) times daily.  1 vial  11  . Insulin Glargine (LANTUS SOLOSTAR) 100  UNIT/ML Solostar Pen Inject 36 Units into the skin 2 (two) times daily.  8 pen  11  . Insulin Pen Needle 31G X 8 MM MISC 1 Container by Does not apply route as needed (Dispense Quantity sufficient for 5 injections daily (lantus and novolog pens)).  1 each  prn  . ketorolac (TORADOL) 10 MG tablet       . Lancets (ONETOUCH ULTRASOFT) lancets Use 4 times daily to check cbg  100 each  12  . levocetirizine (XYZAL) 5 MG tablet Take 1 tablet (5 mg total) by mouth every evening.  30 tablet  3  . lisinopril (PRINIVIL,ZESTRIL) 40 MG tablet Take 1 tablet (40 mg total) by mouth daily.  30 tablet  6  . metoCLOPramide (REGLAN) 10 MG tablet Take 1 tablet (10 mg total) by mouth 3 (three) times daily before meals.  90 tablet  3  . metoprolol (LOPRESSOR) 100 MG tablet Take 2 tablets (200 mg total) by mouth 2 (two) times daily.  120 tablet  6  . montelukast (SINGULAIR) 10 MG tablet take 1 tablet by mouth at bedtime   90 tablet  1  . mupirocin ointment (BACTROBAN) 2 % Apply topically 3 (three) times daily.  22 g  0  . nicotine polacrilex (NICORETTE) 2 MG gum Take 2 mg by mouth daily as needed. Use as needed for craving.   Use up to 10 pieces per day.      . nortriptyline (PAMELOR) 25 MG capsule Take 1 capsule (25 mg total) by mouth at bedtime.  30 capsule  3  . nystatin (MYCOSTATIN) 100000 UNIT/ML suspension Take 500,000 Units by mouth 2 (two) times daily. Swish and swallow      . nystatin (MYCOSTATIN) powder Apply topically 3 (three) times daily. To affected area  30 g  3  . nystatin cream (MYCOSTATIN) Apply 1 application topically 2 (two) times daily as needed. For rash      . olopatadine (PATANOL) 0.1 % ophthalmic solution Place 1 drop into both eyes 2 (two) times daily.  5 mL  12  . ondansetron (ZOFRAN) 4 MG tablet Take 1 tablet (4 mg total) by mouth every 8 (eight) hours as needed. For nausea  20 tablet  2  . oxyCODONE-acetaminophen (ROXICET) 5-325 MG per tablet Take 1 tablet by mouth every 8 (eight) hours as needed for severe pain.  20 tablet  0  . pentosan polysulfate (ELMIRON) 100 MG capsule Take 1 capsule (100 mg total) by mouth 3 (three) times daily before meals.  270 capsule  1  . phenazopyridine (PYRIDIUM) 200 MG tablet Take 200 mg by mouth every 8 (eight) hours.       . polyethylene glycol (GLYCOLAX) packet Take 17 g by mouth every 3 (three) days. Mix in 4-6 oz of water      . prednisoLONE acetate (PRED FORTE) 1 % ophthalmic suspension Place 1 drop into both eyes 2 (two) times daily.       . predniSONE (DELTASONE) 10 MG tablet Take 5 pills daily x5 days, then 4 pills x2 days, then 3 pills x2 days, then 2 pills x2 days, then 1 pill x2 days  45 tablet  0  . pregabalin (LYRICA) 150 MG capsule Take 1 capsule (150 mg total) by mouth 2 (two) times daily.  60 capsule  3  . promethazine (PHENERGAN) 25 MG tablet Take 1 tablet (25 mg total) by mouth every 6 (six) hours as needed for nausea.  12 tablet  0  .  QUEtiapine (SEROQUEL) 200 MG tablet Take 0.5 tablets (100 mg total) by mouth at bedtime.  30 tablet  6  . ranitidine (ZANTAC) 150 MG tablet Take 150 mg by mouth daily as needed. For acid reflux      . spironolactone (ALDACTONE) 25 MG tablet Take 1 tablet (25 mg total) by mouth daily.  30 tablet  3  . sulfamethoxazole-trimethoprim (BACTRIM DS) 800-160 MG per tablet Take 1 tablet by mouth 2 (two) times daily.  20 tablet  0   No current facility-administered medications on file prior to visit.    I have reviewed and updated the following as appropriate: allergies and current medications SHx: current smoker   Review of Systems See HPI    Objective:   Physical Exam BP 160/90  Pulse 102  Temp(Src) 98.8 F (37.1 C) (Oral)  Ht 5\' 3"  (1.6 m)  Wt 232 lb (105.235 kg)  BMI 41.11 kg/m2 Gen: alert, cooperative, NAD, coughing HEENT: AT/[place], sclera white, MMM, no pharyngeal erythema or exudate, moderate sinus tenderness throughout, nasal mucosa mildly swollen Neck: supple, no LAD CV: RRR, no murmurs Pulm: good air movement; scattered wheeze Chest: tender over left posterior shoulder and anterior chest wall to palpation Right thumb: new nail starting to grow; no erythema; exposed nail bed does appear dry      Assessment & Plan:

## 2013-06-30 NOTE — Patient Instructions (Addendum)
It was nice to see you! I'm sorry you aren't feeling well.  I think you have bronchitis and maybe early pneumonia. Take Levaquin 1 pill a day for 5 days. Use the hydrocortisone cream on your face twice a day.  Do not use for more than 1 week.  I sent in EMLA cream for your thumb.  It has numbing medicine in it.  Follow up in 1-2 weeks.

## 2013-06-30 NOTE — Assessment & Plan Note (Addendum)
Concern for developing pneumonia given smoker and some pleuritic vs MSK chest pain. Will treat with levaquin x5 days. Hydrocortisone given for skin rash which I suspect is a viral exanthem.  Return precautions reviewed. F/u in 1-2 weeks.

## 2013-07-04 ENCOUNTER — Other Ambulatory Visit: Payer: Self-pay | Admitting: *Deleted

## 2013-07-04 ENCOUNTER — Telehealth: Payer: Self-pay | Admitting: Emergency Medicine

## 2013-07-04 MED ORDER — ESOMEPRAZOLE MAGNESIUM 40 MG PO CPDR: 40.0000 mg | DELAYED_RELEASE_CAPSULE | Freq: Every day | ORAL | Status: DC

## 2013-07-04 NOTE — Telephone Encounter (Signed)
Patient needs refill on nebulizer solution.

## 2013-07-05 ENCOUNTER — Other Ambulatory Visit: Payer: Self-pay | Admitting: *Deleted

## 2013-07-05 MED ORDER — ESOMEPRAZOLE MAGNESIUM 40 MG PO CPDR: 40.0000 mg | DELAYED_RELEASE_CAPSULE | Freq: Every day | ORAL | Status: DC

## 2013-07-15 ENCOUNTER — Encounter: Payer: Self-pay | Admitting: Emergency Medicine

## 2013-07-15 ENCOUNTER — Ambulatory Visit (INDEPENDENT_AMBULATORY_CARE_PROVIDER_SITE_OTHER): Payer: Commercial Managed Care - HMO | Admitting: Emergency Medicine

## 2013-07-15 VITALS — BP 118/73 | HR 82 | Temp 98.1°F | Ht 63.0 in | Wt 239.0 lb

## 2013-07-15 DIAGNOSIS — J209 Acute bronchitis, unspecified: Secondary | ICD-10-CM

## 2013-07-15 MED ORDER — PREDNISONE 10 MG PO TABS: ORAL_TABLET | ORAL | Status: DC

## 2013-07-15 MED ORDER — HYDROCODONE-HOMATROPINE 5-1.5 MG/5ML PO SYRP: 5.0000 mL | ORAL_SOLUTION | Freq: Four times a day (QID) | ORAL | Status: DC | PRN

## 2013-07-15 NOTE — Progress Notes (Signed)
Subjective:    Patient ID: [name] [name], female    DOB: 1950-12-31, 62 y.o.   MRN: [id number]  HPI [name] [name] is here for f/u bronchitis.  She was seen about 2 weeks ago and diagnoses with acute bronchitis.  She was placed on Levaquin which she has completed.  She states she is about 60-70% better today.  She still has significant cough with green mucous.  Also with right shoulder/arm pain, back pain and stomach pain that is worse with coughing.  Still with some nausea and post-tussive emesis.  No fevers.  Current Outpatient Prescriptions on File Prior to Visit  Medication Sig Dispense Refill  . albuterol (PROVENTIL HFA;VENTOLIN HFA) 108 (90 BASE) MCG/ACT inhaler Inhale 2 puffs into the lungs every 4 (four) hours as needed. For shortness of breath  8.5 g  1  . albuterol (PROVENTIL) (2.5 MG/3ML) 0.083% nebulizer solution Take 3 mLs (2.5 mg total) by nebulization every 6 (six) hours as needed for wheezing.  150 mL  1  . ALREX 0.2 % SUSP       . aspirin EC 81 MG tablet Take 81 mg by mouth every morning.       . Aspirin-Acetaminophen-Caffeine (EXCEDRIN MIGRAINE PO) Take 2 capsules by mouth 3 (three) times daily as needed. For migraine      . atorvastatin (LIPITOR) 20 MG tablet Take 20 mg by mouth every morning.      . beclomethasone (QVAR) 80 MCG/ACT inhaler Inhale 2 puffs into the lungs 2 (two) times daily.  1 Inhaler  12  . cephALEXin (KEFLEX) 500 MG capsule Take 1 capsule (500 mg total) by mouth 3 (three) times daily.  30 capsule  0  . clobetasol ointment (TEMOVATE) 0.27 % Apply 1 application topically at bedtime. To palms  30 g  0  . diphenhydrAMINE (BENADRYL) 25 mg capsule Take 25 mg by mouth every 6 (six) hours as needed. For itching      . docusate sodium (COLACE) 100 MG capsule Take 100 mg by mouth 2 (two) times daily.       . DULoxetine (CYMBALTA) 20 MG capsule Take 1 capsule (20 mg total) by mouth daily.  30 capsule  3  . DUREZOL 0.05 % EMUL       . erythromycin (E-MYCIN) 250  MG tablet Take 1 tablet (250 mg total) by mouth 3 (three) times daily before meals.  32 tablet  0  . esomeprazole (NEXIUM) 40 MG capsule Take 1 capsule (40 mg total) by mouth daily before breakfast.  90 capsule  3  . glucose blood (ONE TOUCH ULTRA TEST) test strip Use 4 times daily.  100 each  12  . hydrochlorothiazide (HYDRODIURIL) 25 MG tablet Take 25 mg by mouth every morning.      Marland Kitchen HYDROcodone-acetaminophen (NORCO) 10-325 MG per tablet Take 1 tablet by mouth daily as needed for pain.  30 tablet  0  . HYDROcodone-acetaminophen (NORCO/VICODIN) 5-325 MG per tablet Take 1-2 tablets by mouth every 6 (six) hours as needed for moderate pain.  20 tablet  0  . hydrocortisone cream 0.5 % Apply 1 application topically 2 (two) times daily. ONLY USE FOR 1 WEEK  30 g  0  . hydrOXYzine (ATARAX/VISTARIL) 10 MG tablet       . Hypromellose (ARTIFICIAL TEARS OP) Place 1 drop into both eyes daily as needed. For dry eyes      . insulin aspart (NOVOLOG) 100 UNIT/ML injection Inject 15 Units into the skin 2 (two)  times daily.  1 vial  11  . Insulin Glargine (LANTUS SOLOSTAR) 100 UNIT/ML Solostar Pen Inject 36 Units into the skin 2 (two) times daily.  8 pen  11  . Insulin Pen Needle 31G X 8 MM MISC 1 Container by Does not apply route as needed (Dispense Quantity sufficient for 5 injections daily (lantus and novolog pens)).  1 each  prn  . ketorolac (TORADOL) 10 MG tablet       . Lancets (ONETOUCH ULTRASOFT) lancets Use 4 times daily to check cbg  100 each  12  . levocetirizine (XYZAL) 5 MG tablet Take 1 tablet (5 mg total) by mouth every evening.  30 tablet  3  . levofloxacin (LEVAQUIN) 500 MG tablet Take 1 tablet (500 mg total) by mouth daily.  5 tablet  0  . lidocaine-prilocaine (EMLA) cream Apply 1 application topically as needed. Apply to right thumb as needed  30 g  0  . lisinopril (PRINIVIL,ZESTRIL) 40 MG tablet Take 1 tablet (40 mg total) by mouth daily.  30 tablet  6  . metoCLOPramide (REGLAN) 10 MG tablet  Take 1 tablet (10 mg total) by mouth 3 (three) times daily before meals.  90 tablet  3  . metoprolol (LOPRESSOR) 100 MG tablet Take 2 tablets (200 mg total) by mouth 2 (two) times daily.  120 tablet  6  . montelukast (SINGULAIR) 10 MG tablet take 1 tablet by mouth at bedtime  90 tablet  1  . mupirocin ointment (BACTROBAN) 2 % Apply topically 3 (three) times daily.  22 g  0  . nicotine polacrilex (NICORETTE) 2 MG gum Take 2 mg by mouth daily as needed. Use as needed for craving.   Use up to 10 pieces per day.      . nortriptyline (PAMELOR) 25 MG capsule Take 1 capsule (25 mg total) by mouth at bedtime.  30 capsule  3  . nystatin (MYCOSTATIN) 100000 UNIT/ML suspension Take 500,000 Units by mouth 2 (two) times daily. Swish and swallow      . nystatin (MYCOSTATIN) powder Apply topically 3 (three) times daily. To affected area  30 g  3  . nystatin cream (MYCOSTATIN) Apply 1 application topically 2 (two) times daily as needed. For rash      . olopatadine (PATANOL) 0.1 % ophthalmic solution Place 1 drop into both eyes 2 (two) times daily.  5 mL  12  . ondansetron (ZOFRAN) 4 MG tablet Take 1 tablet (4 mg total) by mouth every 8 (eight) hours as needed. For nausea  20 tablet  2  . oxyCODONE-acetaminophen (ROXICET) 5-325 MG per tablet Take 1 tablet by mouth every 8 (eight) hours as needed for severe pain.  20 tablet  0  . pentosan polysulfate (ELMIRON) 100 MG capsule Take 1 capsule (100 mg total) by mouth 3 (three) times daily before meals.  270 capsule  1  . phenazopyridine (PYRIDIUM) 200 MG tablet Take 200 mg by mouth every 8 (eight) hours.       . polyethylene glycol (GLYCOLAX) packet Take 17 g by mouth every 3 (three) days. Mix in 4-6 oz of water      . prednisoLONE acetate (PRED FORTE) 1 % ophthalmic suspension Place 1 drop into both eyes 2 (two) times daily.       . pregabalin (LYRICA) 150 MG capsule Take 1 capsule (150 mg total) by mouth 2 (two) times daily.  60 capsule  3  . promethazine (PHENERGAN)  25 MG tablet Take 1  tablet (25 mg total) by mouth every 6 (six) hours as needed for nausea.  12 tablet  0  . QUEtiapine (SEROQUEL) 200 MG tablet Take 0.5 tablets (100 mg total) by mouth at bedtime.  30 tablet  6  . ranitidine (ZANTAC) 150 MG tablet Take 150 mg by mouth daily as needed. For acid reflux      . spironolactone (ALDACTONE) 25 MG tablet Take 1 tablet (25 mg total) by mouth daily.  30 tablet  3  . sulfamethoxazole-trimethoprim (BACTRIM DS) 800-160 MG per tablet Take 1 tablet by mouth 2 (two) times daily.  20 tablet  0   No current facility-administered medications on file prior to visit.    I have reviewed and updated the following as appropriate: allergies and current medications SHx: current smoker   Review of Systems See HPI    Objective:   Physical Exam BP 118/73  Pulse 82  Temp(Src) 98.1 F (36.7 C) (Oral)  Ht 5\' 3"  (1.6 m)  Wt 239 lb (108.41 kg)  BMI 42.35 kg/m2  SpO2 98% Gen: alert, cooperative, NAD HEENT: AT/[place], sclera white, MMM Neck: supple, no LAD, negative spurlings bilaterally Pulm: CTAB, no wheezes or rales Abd: +BS, soft, diffuse mild tenderness to light palpation Right arm: sensation intact; trapezius spasm; grip strength at baseline     Assessment & Plan:

## 2013-07-15 NOTE — Patient Instructions (Signed)
It was nice to see you!  I'm glad you're feeling some better - we'll work on getting you back to 100%.  Take the prednisone 5 tablets for 5 days, then 4 tablets for 2 days, then 3 tablets for 2 days, then 2 tablets for 2 days, then 1 tablet for 2 days. Use the cough syrup every 6 hours as needed.  I think once your cough gets better, everything else will improve as well.  I will see you back in 2 weeks.

## 2013-07-15 NOTE — Assessment & Plan Note (Signed)
Improving but with cough, post-tussive emesis and resultant muscle aches. Will do course of prednisone with taper as in AVS. Hycodan cough syrup q6hr prn. Follow up in 2 weeks.

## 2013-08-03 ENCOUNTER — Ambulatory Visit: Payer: Medicare HMO | Admitting: Emergency Medicine

## 2013-08-06 ENCOUNTER — Other Ambulatory Visit: Payer: Self-pay | Admitting: Family Medicine

## 2013-08-08 NOTE — Telephone Encounter (Signed)
Pt out of needles for her pen.  Pharmacy sent request.. Need to have refilled asap

## 2013-08-12 ENCOUNTER — Ambulatory Visit (INDEPENDENT_AMBULATORY_CARE_PROVIDER_SITE_OTHER): Payer: Commercial Managed Care - HMO | Admitting: Emergency Medicine

## 2013-08-12 ENCOUNTER — Ambulatory Visit (INDEPENDENT_AMBULATORY_CARE_PROVIDER_SITE_OTHER): Payer: Commercial Managed Care - HMO | Admitting: Pharmacist

## 2013-08-12 ENCOUNTER — Encounter: Payer: Self-pay | Admitting: Emergency Medicine

## 2013-08-12 VITALS — BP 129/70 | HR 100 | Temp 98.3°F | Ht 63.0 in | Wt 236.0 lb

## 2013-08-12 DIAGNOSIS — E1165 Type 2 diabetes mellitus with hyperglycemia: Secondary | ICD-10-CM

## 2013-08-12 DIAGNOSIS — Z7189 Other specified counseling: Secondary | ICD-10-CM

## 2013-08-12 DIAGNOSIS — Z716 Tobacco abuse counseling: Secondary | ICD-10-CM

## 2013-08-12 DIAGNOSIS — IMO0001 Reserved for inherently not codable concepts without codable children: Secondary | ICD-10-CM

## 2013-08-12 DIAGNOSIS — F17200 Nicotine dependence, unspecified, uncomplicated: Secondary | ICD-10-CM

## 2013-08-12 DIAGNOSIS — IMO0002 Reserved for concepts with insufficient information to code with codable children: Secondary | ICD-10-CM

## 2013-08-12 DIAGNOSIS — J019 Acute sinusitis, unspecified: Secondary | ICD-10-CM | POA: Insufficient documentation

## 2013-08-12 DIAGNOSIS — G894 Chronic pain syndrome: Secondary | ICD-10-CM

## 2013-08-12 DIAGNOSIS — E2839 Other primary ovarian failure: Secondary | ICD-10-CM

## 2013-08-12 LAB — POCT GLYCOSYLATED HEMOGLOBIN (HGB A1C): Hemoglobin A1C: 9.7

## 2013-08-12 MED ORDER — KETOROLAC TROMETHAMINE 60 MG/2ML IM SOLN
60.0000 mg | Freq: Once | INTRAMUSCULAR | Status: AC
  Administered 2013-08-12: 60 mg via INTRAMUSCULAR

## 2013-08-12 MED ORDER — METOPROLOL TARTRATE 100 MG PO TABS: 200.0000 mg | ORAL_TABLET | Freq: Two times a day (BID) | ORAL | Status: DC

## 2013-08-12 MED ORDER — ONDANSETRON HCL 4 MG PO TABS: 4.0000 mg | ORAL_TABLET | Freq: Three times a day (TID) | ORAL | Status: DC | PRN

## 2013-08-12 MED ORDER — AMOXICILLIN-POT CLAVULANATE 875-125 MG PO TABS: 1.0000 | ORAL_TABLET | Freq: Two times a day (BID) | ORAL | Status: DC

## 2013-08-12 MED ORDER — NYSTATIN 100000 UNIT/ML MT SUSP
500000.0000 [IU] | Freq: Two times a day (BID) | OROMUCOSAL | Status: DC
Start: 2013-08-12 — End: 2014-08-07

## 2013-08-12 MED ORDER — ERYTHROMYCIN BASE 250 MG PO TABS: 250.0000 mg | ORAL_TABLET | Freq: Three times a day (TID) | ORAL | Status: DC

## 2013-08-12 MED ORDER — MELOXICAM 7.5 MG PO TABS: 7.5000 mg | ORAL_TABLET | Freq: Every day | ORAL | Status: DC

## 2013-08-12 MED ORDER — ONETOUCH ULTRA 2 W/DEVICE KIT: 1.0000 | PACK | Freq: Once | Status: DC

## 2013-08-12 MED ORDER — GLUCOSE BLOOD VI STRP
ORAL_STRIP | Status: DC
Start: 2013-08-12 — End: 2013-10-14

## 2013-08-12 NOTE — Patient Instructions (Signed)
It was nice to see you!  I think you have a sinus infection. Take the Augmentin twice a day for 10 days.  This may cause some diarrhea.  For your back pain 1. I have referred you to [facility] on [address]. 2. Take Meloxicam 1 pill daily.  We are getting a DEXA scan to look for osteoporosis and a CT scan to screen for lung cancer.  I will see you back in 1 month.

## 2013-08-12 NOTE — Assessment & Plan Note (Signed)
Down to 2 cigarettes/day with gum. Given her long history of smoking, will go ahead and order screening CT chest.

## 2013-08-12 NOTE — Assessment & Plan Note (Signed)
Referral to [facility] placed at patient's request. Current pain is mostly in SI joints. Toradol 60mg  IM given today. Meloxicam 7.5mg  daily. F/u in 1 month.

## 2013-08-12 NOTE — Assessment & Plan Note (Signed)
Time course consistent with acute bacterial sinusitis. Will treat with Augmentin x10 days. Reviewed side effects.

## 2013-08-12 NOTE — Patient Instructions (Signed)
Thank you for coming in today! It was very nice to meet you!  We are sending a prescription for a new meter to [facility]. We will also send a prescription for strips and lancets.  We increased you Lantus to 38 units twice a day. Keep taking Novolog 15 units twice a day with meals. I will call you next Friday to see what your blood sugars are and work on your insulin.  Please schedule an appointment to come see [place] in 1 month.   Remember your goal to only smoking 1 cigarette a day by the next time we see you.

## 2013-08-12 NOTE — Progress Notes (Signed)
S:    Patient arrives in good spirits from an appointment with her physician for a sinus infection.    Presents for diabetes and smoking cessation counseling. Patient has taken insulin regularly. She is currently taking Lantus and Novolog. She does not have a meter and has not taken a home blood glucose for 4 months. She is having frequent urination and has a history of diabetic neuropathy. She is currently smoking 2-4 cigarettes a day. She states she is only smoking when she is anxious or doing house work. She is using 4 mg nicotine replacement gum. 2 pieces a day.   O:  . Lab Results  Component Value Date   HGBA1C 9.7 08/12/2013    No home glucose readings are available.  A/P: Diabetes is currently uncontrolled.  Denies hypoglycemic events and is able to verbalize appropriate hypoglycemia management plan.  Reports adherence with medication. Control is suboptimal due to diet and past resistance to change.  Increased dose of basal insulin Lantus (insulin glargine) to 38 units twice a day. Continued rapid insulin Novolog (insulin aspart) 15 units twice daily. Pharmacy clinic will call next week to determine if she has picked up the glucometer and strips from [facility].   Chronic Tobacco Abuse in patient who is currently in preparation stage of quitting.  The patient set a goal of smoking only 1 cigarette by the time she comes to her next visit (in 1 month). Patient counseled on proper use, side effects, and considerations when using nicotine replacement gum. She expressed understanding and will consider using half of a piece of the 4 mg gum, and using as needed for cravings. Discussed nonpharmacologic coping mechanisms for stress, ie, deep breathing exercises and relaxation.   Written patient instructions provided.  Follow up in Pharmacist Clinic Visit in 1 month.   Total time in face to face counseling 60 minutes.  Patient seen with Toribio Harbour, PharmD Candidate, Silas [place], PharmD Candidate and  Wilfred Curtis, PharmD Resident.

## 2013-08-12 NOTE — Progress Notes (Signed)
Subjective:    Patient ID: [name] [name], female    DOB: 1951-02-17, 63 y.o.   MRN: [id number]  HPI Florella Mcneese is here for f/u of her pain and sinus infection.  Sinus infection She states the bronchitis is mostly resolved at this time.  However, she has noticed an increase in purulent nasal discharge that is foul smelling.  No fevers.  +sinus pressure.  Back pain She has chronic pain complaints.  Today, reports back in low back and hips.  Worse in the morning and with prolonged walking.  No radiation.  No weakness.  No bowel/bladder incontinence.  She would like a referral to the pain management office on [address] as she did not like the other doctor she saw in the fall.  Smoking She reports using nicotine gum and only smoking 2 cigarettes per day.  She is very worried about lung cancer.  Current Outpatient Prescriptions on File Prior to Visit  Medication Sig Dispense Refill  . albuterol (PROVENTIL HFA;VENTOLIN HFA) 108 (90 BASE) MCG/ACT inhaler Inhale 2 puffs into the lungs every 4 (four) hours as needed. For shortness of breath  8.5 g  1  . albuterol (PROVENTIL) (2.5 MG/3ML) 0.083% nebulizer solution Take 3 mLs (2.5 mg total) by nebulization every 6 (six) hours as needed for wheezing.  150 mL  1  . ALREX 0.2 % SUSP       . aspirin EC 81 MG tablet Take 81 mg by mouth every morning.       . Aspirin-Acetaminophen-Caffeine (EXCEDRIN MIGRAINE PO) Take 2 capsules by mouth 3 (three) times daily as needed. For migraine      . atorvastatin (LIPITOR) 20 MG tablet Take 20 mg by mouth every morning.      . B-D ULTRAFINE III SHORT PEN 31G X 8 MM MISC USE 5 TIMES A DAY, WITH LANTUS AND NOVOLOG  100 each  PRN  . beclomethasone (QVAR) 80 MCG/ACT inhaler Inhale 2 puffs into the lungs 2 (two) times daily.  1 Inhaler  12  . clobetasol ointment (TEMOVATE) 3.90 % Apply 1 application topically at bedtime. To palms  30 g  0  . diphenhydrAMINE (BENADRYL) 25 mg capsule Take 25 mg by mouth every 6  (six) hours as needed. For itching      . docusate sodium (COLACE) 100 MG capsule Take 100 mg by mouth 2 (two) times daily.       . DULoxetine (CYMBALTA) 20 MG capsule Take 1 capsule (20 mg total) by mouth daily.  30 capsule  3  . DUREZOL 0.05 % EMUL       . esomeprazole (NEXIUM) 40 MG capsule Take 1 capsule (40 mg total) by mouth daily before breakfast.  90 capsule  3  . glucose blood (ONE TOUCH ULTRA TEST) test strip Use 4 times daily.  100 each  12  . hydrochlorothiazide (HYDRODIURIL) 25 MG tablet Take 25 mg by mouth every morning.      Marland Kitchen HYDROcodone-acetaminophen (NORCO/VICODIN) 5-325 MG per tablet Take 1-2 tablets by mouth every 6 (six) hours as needed for moderate pain.  20 tablet  0  . HYDROcodone-homatropine (HYCODAN) 5-1.5 MG/5ML syrup Take 5 mLs by mouth every 6 (six) hours as needed for cough.  120 mL  0  . hydrocortisone cream 0.5 % Apply 1 application topically 2 (two) times daily. ONLY USE FOR 1 WEEK  30 g  0  . hydrOXYzine (ATARAX/VISTARIL) 10 MG tablet       . Hypromellose (ARTIFICIAL TEARS  OP) Place 1 drop into both eyes daily as needed. For dry eyes      . insulin aspart (NOVOLOG) 100 UNIT/ML injection Inject 15 Units into the skin 2 (two) times daily.  1 vial  11  . Insulin Glargine (LANTUS SOLOSTAR) 100 UNIT/ML Solostar Pen Inject 36 Units into the skin 2 (two) times daily.  8 pen  11  . ketorolac (TORADOL) 10 MG tablet       . Lancets (ONETOUCH ULTRASOFT) lancets Use 4 times daily to check cbg  100 each  12  . levocetirizine (XYZAL) 5 MG tablet Take 1 tablet (5 mg total) by mouth every evening.  30 tablet  3  . lidocaine-prilocaine (EMLA) cream Apply 1 application topically as needed. Apply to right thumb as needed  30 g  0  . lisinopril (PRINIVIL,ZESTRIL) 40 MG tablet Take 1 tablet (40 mg total) by mouth daily.  30 tablet  6  . metoCLOPramide (REGLAN) 10 MG tablet Take 1 tablet (10 mg total) by mouth 3 (three) times daily before meals.  90 tablet  3  . montelukast  (SINGULAIR) 10 MG tablet take 1 tablet by mouth at bedtime  90 tablet  1  . mupirocin ointment (BACTROBAN) 2 % Apply topically 3 (three) times daily.  22 g  0  . nicotine polacrilex (NICORETTE) 2 MG gum Take 2 mg by mouth daily as needed. Use as needed for craving.   Use up to 10 pieces per day.      . nortriptyline (PAMELOR) 25 MG capsule Take 1 capsule (25 mg total) by mouth at bedtime.  30 capsule  3  . nystatin (MYCOSTATIN) 100000 UNIT/ML suspension Take 500,000 Units by mouth 2 (two) times daily. Swish and swallow      . nystatin (MYCOSTATIN) powder Apply topically 3 (three) times daily. To affected area  30 g  3  . nystatin cream (MYCOSTATIN) Apply 1 application topically 2 (two) times daily as needed. For rash      . olopatadine (PATANOL) 0.1 % ophthalmic solution Place 1 drop into both eyes 2 (two) times daily.  5 mL  12  . oxyCODONE-acetaminophen (ROXICET) 5-325 MG per tablet Take 1 tablet by mouth every 8 (eight) hours as needed for severe pain.  20 tablet  0  . pentosan polysulfate (ELMIRON) 100 MG capsule Take 1 capsule (100 mg total) by mouth 3 (three) times daily before meals.  270 capsule  1  . phenazopyridine (PYRIDIUM) 200 MG tablet Take 200 mg by mouth every 8 (eight) hours.       . polyethylene glycol (GLYCOLAX) packet Take 17 g by mouth every 3 (three) days. Mix in 4-6 oz of water      . prednisoLONE acetate (PRED FORTE) 1 % ophthalmic suspension Place 1 drop into both eyes 2 (two) times daily.       . predniSONE (DELTASONE) 10 MG tablet Take 5 pills daily x5 days, then 4 pills x2 days, then 3 pills x2 days, then 2 pills x2 days, then 1 pill x2 days  45 tablet  0  . pregabalin (LYRICA) 150 MG capsule Take 1 capsule (150 mg total) by mouth 2 (two) times daily.  60 capsule  3  . promethazine (PHENERGAN) 25 MG tablet Take 1 tablet (25 mg total) by mouth every 6 (six) hours as needed for nausea.  12 tablet  0  . QUEtiapine (SEROQUEL) 200 MG tablet Take 0.5 tablets (100 mg total) by  mouth at bedtime.  30 tablet  6  . ranitidine (ZANTAC) 150 MG tablet Take 150 mg by mouth daily as needed. For acid reflux      . spironolactone (ALDACTONE) 25 MG tablet Take 1 tablet (25 mg total) by mouth daily.  30 tablet  3   No current facility-administered medications on file prior to visit.    I have reviewed and updated the following as appropriate: allergies and current medications SHx: current smoker  Health Maintenance: dexa scan ordered; screening CT chest ordered   Review of Systems See HPI    Objective:   Physical Exam BP 129/70  Pulse 100  Temp(Src) 98.3 F (36.8 C) (Oral)  Ht 5\' 3"  (1.6 m)  Wt 236 lb (107.049 kg)  BMI 41.82 kg/m2 Gen: alert, cooperative, NAD HEENT: AT/[place], sclera white, MMM, + purulent nasal discharge, +maxillary tenderness Neck: supple, no LAD CV: RRR, pulse 100 Ext: trace edema Back: tender over bilateral SI joints and coccyx; negative seated SLR bilaterally; unable to perform FABER      Assessment & Plan:

## 2013-08-15 ENCOUNTER — Encounter: Payer: Self-pay | Admitting: Pharmacist

## 2013-08-15 MED ORDER — INSULIN GLARGINE 100 UNIT/ML SOLOSTAR PEN: 36.0000 [IU] | PEN_INJECTOR | Freq: Two times a day (BID) | SUBCUTANEOUS | Status: DC

## 2013-08-15 MED ORDER — INSULIN ASPART 100 UNIT/ML ~~LOC~~ SOLN: 15.0000 [IU] | Freq: Two times a day (BID) | SUBCUTANEOUS | Status: DC

## 2013-08-15 NOTE — Assessment & Plan Note (Signed)
Chronic Tobacco Abuse in patient who is currently in preparation stage of quitting.  The patient set a goal of smoking only 1 cigarette by the time she comes to her next visit (in 1 month). Patient counseled on proper use, side effects, and considerations when using nicotine replacement gum. She expressed understanding and will consider using half of a piece of the 4 mg gum, and using as needed for cravings. Discussed nonpharmacologic coping mechanisms for stress, ie, deep breathing exercises and relaxation.

## 2013-08-15 NOTE — Assessment & Plan Note (Signed)
Diabetes is currently uncontrolled.  Denies hypoglycemic events and is able to verbalize appropriate hypoglycemia management plan.  Reports adherence with medication. Control is suboptimal due to diet and past resistance to change.  Increased dose of basal insulin Lantus (insulin glargine) to 38 units twice a day. Continued rapid insulin Novolog (insulin aspart) 15 units twice daily. Pharmacy clinic will call next week to determine if she has picked up the glucometer and strips from [facility].

## 2013-08-15 NOTE — Progress Notes (Signed)
Patient ID: [name] [name], female   DOB: 1950-07-21, 63 y.o.   MRN: [id number] Reviewed: Agree with Dr. Graylin Shiver documentation and management.

## 2013-08-19 ENCOUNTER — Other Ambulatory Visit: Payer: Self-pay | Admitting: Emergency Medicine

## 2013-08-19 ENCOUNTER — Ambulatory Visit (HOSPITAL_COMMUNITY)
Admission: RE | Admit: 2013-08-19 | Discharge: 2013-08-19 | Disposition: A | Discharge status: Home / Self Care | Payer: Medicare HMO | Source: Ambulatory Visit | Attending: Family Medicine | Admitting: Family Medicine

## 2013-08-19 DIAGNOSIS — Z716 Tobacco abuse counseling: Secondary | ICD-10-CM

## 2013-08-19 DIAGNOSIS — F17200 Nicotine dependence, unspecified, uncomplicated: Secondary | ICD-10-CM

## 2013-08-26 ENCOUNTER — Ambulatory Visit (HOSPITAL_COMMUNITY)
Admission: RE | Admit: 2013-08-26 | Discharge: 2013-08-26 | Disposition: A | Discharge status: Home / Self Care | Payer: Medicare HMO | Source: Ambulatory Visit | Attending: Family Medicine | Admitting: Family Medicine

## 2013-08-26 DIAGNOSIS — Z7189 Other specified counseling: Secondary | ICD-10-CM | POA: Insufficient documentation

## 2013-08-26 DIAGNOSIS — F17200 Nicotine dependence, unspecified, uncomplicated: Secondary | ICD-10-CM | POA: Insufficient documentation

## 2013-08-26 DIAGNOSIS — Z716 Tobacco abuse counseling: Secondary | ICD-10-CM

## 2013-09-02 ENCOUNTER — Other Ambulatory Visit: Payer: Self-pay | Admitting: Emergency Medicine

## 2013-09-02 ENCOUNTER — Other Ambulatory Visit: Payer: Self-pay | Admitting: *Deleted

## 2013-09-02 DIAGNOSIS — G894 Chronic pain syndrome: Secondary | ICD-10-CM

## 2013-09-02 MED ORDER — PREGABALIN 150 MG PO CAPS: 150.0000 mg | ORAL_CAPSULE | Freq: Two times a day (BID) | ORAL | Status: DC

## 2013-09-09 ENCOUNTER — Ambulatory Visit
Admission: RE | Admit: 2013-09-09 | Discharge: 2013-09-09 | Disposition: A | Discharge status: Home / Self Care | Payer: Commercial Managed Care - HMO | Source: Ambulatory Visit | Attending: Family Medicine | Admitting: Family Medicine

## 2013-09-09 DIAGNOSIS — E2839 Other primary ovarian failure: Secondary | ICD-10-CM

## 2013-09-15 ENCOUNTER — Ambulatory Visit: Payer: Commercial Managed Care - HMO | Admitting: Pharmacist

## 2013-09-15 ENCOUNTER — Ambulatory Visit: Payer: Commercial Managed Care - HMO | Admitting: Emergency Medicine

## 2013-09-22 ENCOUNTER — Encounter: Payer: Self-pay | Admitting: Pharmacist

## 2013-09-22 ENCOUNTER — Ambulatory Visit (INDEPENDENT_AMBULATORY_CARE_PROVIDER_SITE_OTHER): Payer: Commercial Managed Care - HMO | Admitting: Pharmacist

## 2013-09-22 ENCOUNTER — Encounter: Payer: Self-pay | Admitting: Emergency Medicine

## 2013-09-22 ENCOUNTER — Ambulatory Visit (INDEPENDENT_AMBULATORY_CARE_PROVIDER_SITE_OTHER): Payer: Commercial Managed Care - HMO | Admitting: Emergency Medicine

## 2013-09-22 VITALS — BP 158/82 | HR 93 | Wt 239.0 lb

## 2013-09-22 VITALS — BP 146/71 | HR 109 | Temp 98.5°F | Ht 63.0 in | Wt 239.0 lb

## 2013-09-22 DIAGNOSIS — Z716 Tobacco abuse counseling: Secondary | ICD-10-CM

## 2013-09-22 DIAGNOSIS — IMO0001 Reserved for inherently not codable concepts without codable children: Secondary | ICD-10-CM

## 2013-09-22 DIAGNOSIS — F17200 Nicotine dependence, unspecified, uncomplicated: Secondary | ICD-10-CM

## 2013-09-22 DIAGNOSIS — IMO0002 Reserved for concepts with insufficient information to code with codable children: Secondary | ICD-10-CM

## 2013-09-22 DIAGNOSIS — Z7189 Other specified counseling: Secondary | ICD-10-CM

## 2013-09-22 DIAGNOSIS — E1165 Type 2 diabetes mellitus with hyperglycemia: Secondary | ICD-10-CM

## 2013-09-22 DIAGNOSIS — R1013 Epigastric pain: Secondary | ICD-10-CM

## 2013-09-22 LAB — LIPID PANEL
Cholesterol: 194 mg/dL (ref 0–200)
HDL: 36 mg/dL — ABNORMAL LOW (ref >39)
LDL Cholesterol: 121 mg/dL — ABNORMAL HIGH (ref 0–99)
Total CHOL/HDL Ratio: 5.4 Ratio
Triglycerides: 186 mg/dL — ABNORMAL HIGH (ref <150)
VLDL: 37 mg/dL (ref 0–40)

## 2013-09-22 LAB — COMPLETE METABOLIC PANEL WITH GFR
ALT: 23 U/L (ref 0–35)
AST: 43 U/L — ABNORMAL HIGH (ref 0–37)
Albumin: 4.2 g/dL (ref 3.5–5.2)
Alkaline Phosphatase: 67 U/L (ref 39–117)
BUN: 15 mg/dL (ref 6–23)
CO2: 24 mEq/L (ref 19–32)
Calcium: 9.1 mg/dL (ref 8.4–10.5)
Chloride: 100 mEq/L (ref 96–112)
Creat: 0.89 mg/dL (ref 0.50–1.10)
GFR, Est African American: 79 mL/min
GFR, Est Non African American: 68 mL/min
Glucose, Bld: 221 mg/dL — ABNORMAL HIGH (ref 70–99)
Potassium: 5.9 mEq/L — ABNORMAL HIGH (ref 3.5–5.3)
Sodium: 135 mEq/L (ref 135–145)
Total Bilirubin: 0.5 mg/dL (ref 0.2–1.2)
Total Protein: 7.4 g/dL (ref 6.0–8.3)

## 2013-09-22 LAB — LIPASE: Lipase: 52 U/L (ref 0–75)

## 2013-09-22 MED ORDER — INSULIN GLARGINE 100 UNIT/ML SOLOSTAR PEN: 36.0000 [IU] | PEN_INJECTOR | Freq: Two times a day (BID) | SUBCUTANEOUS | Status: DC

## 2013-09-22 MED ORDER — INSULIN ASPART 100 UNIT/ML FLEXPEN: 15.0000 [IU] | PEN_INJECTOR | Freq: Three times a day (TID) | SUBCUTANEOUS | Status: DC

## 2013-09-22 MED ORDER — INSULIN GLARGINE 100 UNIT/ML SOLOSTAR PEN: 34.0000 [IU] | PEN_INJECTOR | Freq: Two times a day (BID) | SUBCUTANEOUS | Status: DC

## 2013-09-22 MED ORDER — CIPROFLOXACIN HCL 500 MG PO TABS: 500.0000 mg | ORAL_TABLET | Freq: Two times a day (BID) | ORAL | Status: DC

## 2013-09-22 MED ORDER — METRONIDAZOLE 500 MG PO TABS: 500.0000 mg | ORAL_TABLET | Freq: Three times a day (TID) | ORAL | Status: DC

## 2013-09-22 MED ORDER — KETOROLAC TROMETHAMINE 60 MG/2ML IM SOLN
60.0000 mg | Freq: Once | INTRAMUSCULAR | Status: AC
  Administered 2013-09-22: 60 mg via INTRAMUSCULAR

## 2013-09-22 MED ORDER — PROMETHAZINE HCL 25 MG PO TABS: 25.0000 mg | ORAL_TABLET | Freq: Three times a day (TID) | ORAL | Status: DC | PRN

## 2013-09-22 NOTE — Progress Notes (Signed)
S:  [name] [name] is a pleasant 63 yo AAF presenting for continued assistance with cessation of tobacco.  She currently smokes 2 cigarettes (max up to 3 cigarettes) per day, and reports being smoke-free on 7-8 days out of the last month.  She uses nicotine gum 4mg  up to 10 pieces to help with cravings.  Her main triggers for tobacco use include stress and smoking post-consumption.  She has made extensive progress and has not smoked in the middle of the night for quite some time.  Patient did mention interest in nicotine patches due to some experience with one during an inpatient stay.  Of note, patient is currently undergoing work-up for abdominal pain which adds to current stress-load.    A/P: Moderate Nicotine Dependence,  who is good candidate for success b/c of progress already made.  We discussed nonpharmacologic coping mechanisms for stress, including avoidance of stressful situations when possible.   We also discussed possible future goals for continued cut-back of current smoking, such as possibly increasing smoke-free days to ~10 days out of the month and reducing a majority of the tobacco-use days to just 1 daily cigarette.  With her current progress, will not start nicotine patch at this time but may be possible future option.  At next visit, will assess cigarette use and readiness to set a possible future quit date.  Patient seen with Juliene Pina, PharmD.

## 2013-09-22 NOTE — Patient Instructions (Signed)
It was nice to see you!  We are doing some blood work to check on the diarrhea and belly pain.  Take Cipro 500mg  1 pill twice a day for 10 days. Take Flagyl 500mg  1 pill 3 times a day for 10 days.  Followup with me on Tuesday morning.

## 2013-09-22 NOTE — Assessment & Plan Note (Signed)
Lipids checked today. 

## 2013-09-22 NOTE — Progress Notes (Signed)
Subjective:    Patient ID: [name] [name], female    DOB: May 24, 1950, 63 y.o.   MRN: [id number]  HPI [name] [name] is here for abdominal pain.  She has multiple complaints today including abdominal pain, headache, face breaking out, feeling cold. Today we focused on the abdominal pain.  Abdominal pain She is concerned about recurrent pancreatitis as she has had that in the past. She reports pain across the upper abdomen and down the left side. The pain is described as being "like a heart attack." She states this has been going on for about a month. It is associated with nausea, no vomiting. She has had watery diarrhea with blood in it. She states a week ago she had clots per rectum. She continues to see blood in the diarrhea, but no more clots. She reports a subjective fever, but has not taken her temperature. She is tolerating liquids well.  Current Outpatient Prescriptions on File Prior to Visit  Medication Sig Dispense Refill  . albuterol (PROVENTIL HFA;VENTOLIN HFA) 108 (90 BASE) MCG/ACT inhaler Inhale 2 puffs into the lungs every 4 (four) hours as needed. For shortness of breath  8.5 g  1  . albuterol (PROVENTIL) (2.5 MG/3ML) 0.083% nebulizer solution Take 3 mLs (2.5 mg total) by nebulization every 6 (six) hours as needed for wheezing.  150 mL  1  . ALREX 0.2 % SUSP 1-2 drops as needed (itching).       Marland Kitchen aspirin EC 81 MG tablet Take 81 mg by mouth every morning.       . Aspirin-Acetaminophen-Caffeine (EXCEDRIN MIGRAINE PO) Take 2 capsules by mouth 3 (three) times daily as needed. For migraine      . atorvastatin (LIPITOR) 20 MG tablet Take 20 mg by mouth every morning.      . B-D ULTRAFINE III SHORT PEN 31G X 8 MM MISC USE 5 TIMES A DAY, WITH LANTUS AND NOVOLOG  100 each  PRN  . beclomethasone (QVAR) 80 MCG/ACT inhaler Inhale 2 puffs into the lungs 2 (two) times daily.  1 Inhaler  12  . Blood Glucose Monitoring Suppl (ONE TOUCH ULTRA 2) W/DEVICE KIT 1 Device by Does not apply route  once.  1 each  0  . clobetasol ointment (TEMOVATE) 0.51 % Apply 1 application topically at bedtime. To palms  30 g  0  . diphenhydrAMINE (BENADRYL) 25 mg capsule Take 25 mg by mouth every 6 (six) hours as needed. For itching      . docusate sodium (COLACE) 100 MG capsule Take 100 mg by mouth 2 (two) times daily.       . DULoxetine (CYMBALTA) 20 MG capsule Take 1 capsule (20 mg total) by mouth daily.  30 capsule  3  . DUREZOL 0.05 % EMUL Place 2 drops into the right eye 2 (two) times daily.       Marland Kitchen erythromycin (E-MYCIN) 250 MG tablet Take 1 tablet (250 mg total) by mouth 3 (three) times daily before meals.  90 tablet  0  . esomeprazole (NEXIUM) 40 MG capsule Take 1 capsule (40 mg total) by mouth daily before breakfast.  90 capsule  3  . glucose blood (ONE TOUCH ULTRA TEST) test strip Use 4 times daily.  100 each  12  . hydrochlorothiazide (HYDRODIURIL) 25 MG tablet Take 25 mg by mouth every morning.      Marland Kitchen HYDROcodone-acetaminophen (NORCO/VICODIN) 5-325 MG per tablet Take 1-2 tablets by mouth every 6 (six) hours as needed for moderate pain.  20 tablet  0  . hydrocortisone cream 0.5 % Apply 1 application topically 2 (two) times daily. ONLY USE FOR 1 WEEK  30 g  0  . Hypromellose (ARTIFICIAL TEARS OP) Place 1 drop into both eyes daily as needed. For dry eyes      . Lancets (ONETOUCH ULTRASOFT) lancets Use 4 times daily to check cbg  100 each  12  . levocetirizine (XYZAL) 5 MG tablet Take 1 tablet (5 mg total) by mouth every evening.  30 tablet  3  . lidocaine-prilocaine (EMLA) cream Apply 1 application topically as needed. Apply to right thumb as needed  30 g  0  . lisinopril (PRINIVIL,ZESTRIL) 40 MG tablet Take 1 tablet (40 mg total) by mouth daily.  30 tablet  6  . meloxicam (MOBIC) 7.5 MG tablet Take 1 tablet (7.5 mg total) by mouth daily.  30 tablet  3  . metoCLOPramide (REGLAN) 10 MG tablet Take 1 tablet (10 mg total) by mouth 3 (three) times daily before meals.  90 tablet  3  . metoprolol  (LOPRESSOR) 100 MG tablet Take 2 tablets (200 mg total) by mouth 2 (two) times daily.  120 tablet  6  . montelukast (SINGULAIR) 10 MG tablet take 1 tablet by mouth at bedtime  90 tablet  1  . mupirocin ointment (BACTROBAN) 2 % Apply topically 3 (three) times daily.  22 g  0  . nortriptyline (PAMELOR) 25 MG capsule Take 1 capsule (25 mg total) by mouth at bedtime.  30 capsule  3  . nystatin (MYCOSTATIN) 100000 UNIT/ML suspension Take 5 mLs (500,000 Units total) by mouth 2 (two) times daily. Swish and swallow  60 mL  1  . nystatin (MYCOSTATIN) powder Apply topically 3 (three) times daily. To affected area  30 g  3  . olopatadine (PATANOL) 0.1 % ophthalmic solution Place 1 drop into both eyes 2 (two) times daily.  5 mL  12  . ondansetron (ZOFRAN) 4 MG tablet Take 1 tablet (4 mg total) by mouth every 8 (eight) hours as needed for nausea or vomiting. For nausea  30 tablet  3  . oxyCODONE-acetaminophen (ROXICET) 5-325 MG per tablet Take 1 tablet by mouth every 8 (eight) hours as needed for severe pain.  20 tablet  0  . pentosan polysulfate (ELMIRON) 100 MG capsule Take 1 capsule (100 mg total) by mouth 3 (three) times daily before meals.  270 capsule  1  . phenazopyridine (PYRIDIUM) 200 MG tablet Take 200 mg by mouth 3 (three) times daily as needed (bladder pain).       . polyethylene glycol (GLYCOLAX) packet Take 17 g by mouth every 3 (three) days. Mix in 4-6 oz of water      . prednisoLONE acetate (PRED FORTE) 1 % ophthalmic suspension Place 1 drop into both eyes 2 (two) times daily.       . pregabalin (LYRICA) 150 MG capsule Take 1 capsule (150 mg total) by mouth 2 (two) times daily.  60 capsule  3  . QUEtiapine (SEROQUEL) 200 MG tablet Take 0.5 tablets (100 mg total) by mouth at bedtime.  30 tablet  6  . ranitidine (ZANTAC) 150 MG tablet Take 150 mg by mouth daily as needed. For acid reflux       No current facility-administered medications on file prior to visit.    I have reviewed and updated the  following as appropriate: allergies and current medications SHx: current smoker   Review of Systems See HPI  Objective:   Physical Exam BP 146/71  Pulse 109  Temp(Src) 98.5 F (36.9 C) (Oral)  Ht 5' 3"  (1.6 m)  Wt 239 lb (108.41 kg)  BMI 42.35 kg/m2 Gen: alert, cooperative, does not appear to be in distress or pain Abd: +BS, soft, ND, tender diffusely but worse in epigastric and left side      Assessment & Plan:

## 2013-09-22 NOTE — Assessment & Plan Note (Signed)
d 

## 2013-09-22 NOTE — Assessment & Plan Note (Signed)
She does have a history of pancreatitis, however I am more concerned about a bacterial gastroenteritis or diverticulitis. Will check CBC, CMP, lipase. Started Flagyl and Cipro for a 10 day course. Reviewed return precautions. Followup early next week.

## 2013-09-22 NOTE — Assessment & Plan Note (Signed)
Moderate Nicotine Dependence,  who is good candidate for success b/c of progress already made.  We discussed nonpharmacologic coping mechanisms for stress, including avoidance of stressful situations when possible.   We also discussed possible future goals for continued cut-back of current smoking, such as possibly increasing smoke-free days to ~10 days out of the month and reducing a majority of the tobacco-use days to just 1 daily cigarette.  With her current progress, will not start nicotine patch at this time but may be possible future option.  At next visit, will assess cigarette use and readiness to set a possible future quit date.  Patient seen with Juliene Pina, PharmD.

## 2013-09-22 NOTE — Patient Instructions (Signed)
Bring your medicines to the next visit.   Next Pharmacist Visit in late June.   Please schedule on your way out.   Great to see you today.

## 2013-09-23 ENCOUNTER — Other Ambulatory Visit: Payer: Commercial Managed Care - HMO

## 2013-09-23 ENCOUNTER — Telehealth: Payer: Self-pay | Admitting: Emergency Medicine

## 2013-09-23 DIAGNOSIS — R1013 Epigastric pain: Secondary | ICD-10-CM

## 2013-09-23 LAB — CBC WITH DIFFERENTIAL/PLATELET
Basophils Absolute: 0.1 10*3/uL (ref 0.0–0.1)
Basophils Relative: 1 % (ref 0–1)
Eosinophils Absolute: 0.3 10*3/uL (ref 0.0–0.7)
Eosinophils Relative: 4 % (ref 0–5)
HCT: 36.6 % (ref 36.0–46.0)
Hemoglobin: 12.1 g/dL (ref 12.0–15.0)
Lymphocytes Relative: 41 % (ref 12–46)
Lymphs Abs: 3 10*3/uL (ref 0.7–4.0)
MCH: 27.8 pg (ref 26.0–34.0)
MCHC: 33.1 g/dL (ref 30.0–36.0)
MCV: 83.9 fL (ref 78.0–100.0)
Monocytes Absolute: 0.5 10*3/uL (ref 0.1–1.0)
Monocytes Relative: 7 % (ref 3–12)
Neutro Abs: 3.4 10*3/uL (ref 1.7–7.7)
Neutrophils Relative %: 47 % (ref 43–77)
Platelets: 163 10*3/uL (ref 150–400)
RBC: 4.36 MIL/uL (ref 3.87–5.11)
RDW: 14 % (ref 11.5–15.5)
WBC: 7.2 10*3/uL (ref 4.0–10.5)

## 2013-09-23 NOTE — Progress Notes (Signed)
Called pt to return for repeat CMET & CBC/diff due to hemolyzed serum and clotted CBC.   Blood was collected today without any difficulty.  Unk [place], MLS

## 2013-09-23 NOTE — Progress Notes (Signed)
Patient ID: [name] [name], female   DOB: 1950-09-24, 63 y.o.   MRN: [id number] Reviewed: Agree with Dr. Graylin Shiver documentation and management.

## 2013-09-23 NOTE — Telephone Encounter (Signed)
Called pt about yesterdays blood samples;  Clotted tube was hemolyzed and CBC/Diff was clotted.  Left message for pt to call and/or return to office today for repeat blood draw - CMET & CBC/diff.   Unk [place], MLS

## 2013-09-24 LAB — COMPLETE METABOLIC PANEL WITH GFR
ALT: 20 U/L (ref 0–35)
AST: 21 U/L (ref 0–37)
Albumin: 3.8 g/dL (ref 3.5–5.2)
Alkaline Phosphatase: 71 U/L (ref 39–117)
BUN: 13 mg/dL (ref 6–23)
CO2: 25 mEq/L (ref 19–32)
Calcium: 8.9 mg/dL (ref 8.4–10.5)
Chloride: 100 mEq/L (ref 96–112)
Creat: 0.89 mg/dL (ref 0.50–1.10)
GFR, Est African American: 79 mL/min
GFR, Est Non African American: 68 mL/min
Glucose, Bld: 309 mg/dL — ABNORMAL HIGH (ref 70–99)
Potassium: 3.8 mEq/L (ref 3.5–5.3)
Sodium: 135 mEq/L (ref 135–145)
Total Bilirubin: 0.4 mg/dL (ref 0.2–1.2)
Total Protein: 6.9 g/dL (ref 6.0–8.3)

## 2013-09-26 ENCOUNTER — Other Ambulatory Visit: Payer: Commercial Managed Care - HMO

## 2013-09-27 ENCOUNTER — Ambulatory Visit (INDEPENDENT_AMBULATORY_CARE_PROVIDER_SITE_OTHER): Payer: Commercial Managed Care - HMO | Admitting: Emergency Medicine

## 2013-09-27 ENCOUNTER — Encounter: Payer: Self-pay | Admitting: Emergency Medicine

## 2013-09-27 VITALS — BP 155/70 | HR 82 | Temp 98.1°F | Ht 63.0 in | Wt 240.5 lb

## 2013-09-27 DIAGNOSIS — R1013 Epigastric pain: Secondary | ICD-10-CM

## 2013-09-27 DIAGNOSIS — R059 Cough, unspecified: Secondary | ICD-10-CM

## 2013-09-27 DIAGNOSIS — R05 Cough: Secondary | ICD-10-CM

## 2013-09-27 MED ORDER — ALBUTEROL SULFATE (2.5 MG/3ML) 0.083% IN NEBU: 2.5000 mg | INHALATION_SOLUTION | Freq: Four times a day (QID) | RESPIRATORY_TRACT | Status: DC | PRN

## 2013-09-27 MED ORDER — MUPIROCIN 2 % EX OINT: TOPICAL_OINTMENT | Freq: Three times a day (TID) | CUTANEOUS | Status: DC

## 2013-09-27 MED ORDER — PROMETHAZINE HCL 25 MG PO TABS: 25.0000 mg | ORAL_TABLET | Freq: Three times a day (TID) | ORAL | Status: DC | PRN

## 2013-09-27 MED ORDER — BECLOMETHASONE DIPROPIONATE 80 MCG/ACT IN AERS: 2.0000 | INHALATION_SPRAY | Freq: Two times a day (BID) | RESPIRATORY_TRACT | Status: DC

## 2013-09-27 MED ORDER — HYDROCHLOROTHIAZIDE 25 MG PO TABS: 25.0000 mg | ORAL_TABLET | Freq: Every morning | ORAL | Status: DC

## 2013-09-27 MED ORDER — ALBUTEROL SULFATE HFA 108 (90 BASE) MCG/ACT IN AERS: 2.0000 | INHALATION_SPRAY | RESPIRATORY_TRACT | Status: DC | PRN

## 2013-09-27 MED ORDER — FUROSEMIDE 20 MG PO TABS: 20.0000 mg | ORAL_TABLET | Freq: Every day | ORAL | Status: DC | PRN

## 2013-09-27 NOTE — Patient Instructions (Signed)
It was nice to see you!  It looks like the antibiotics are working!  Use the mupirocin on your toe 3 times a day for 2 weeks.  Take the lasix once a day as needed for swelling.  Follow up with me in 2 weeks.

## 2013-09-27 NOTE — Assessment & Plan Note (Addendum)
Improving with antibiotics. She will complete a ten-day course of Cipro and Flagyl. I have resent the Phenergan to her pharmacy to help with the nausea. I reviewed her blood work from last week including a CBC, lipase, and CMP, these were normal.  I did add Lasix 20 mg daily as needed for swelling. Followup in 2 weeks.

## 2013-09-27 NOTE — Progress Notes (Signed)
Subjective:    Patient ID: [name] [name], female    DOB: [birth year], 63 y.o.   MRN: [id number]  HPI [name] [name] is here for followup abdominal pain.  She states that she feels a little better today. The bloody diarrhea is slowly improving as well as the abdominal pain. Today she reports continued nausea, although she was unable to get the Phenergan from her pharmacy. She also reports feeling bloated, and having swelling in her feet. She has been out of her HCTZ for at least several days.  Current Outpatient Prescriptions on File Prior to Visit  Medication Sig Dispense Refill  . ALREX 0.2 % SUSP 1-2 drops as needed (itching).       Marland Kitchen aspirin EC 81 MG tablet Take 81 mg by mouth every morning.       . Aspirin-Acetaminophen-Caffeine (EXCEDRIN MIGRAINE PO) Take 2 capsules by mouth 3 (three) times daily as needed. For migraine      . atorvastatin (LIPITOR) 20 MG tablet Take 20 mg by mouth every morning.      . B-D ULTRAFINE III SHORT PEN 31G X 8 MM MISC USE 5 TIMES A DAY, WITH LANTUS AND NOVOLOG  100 each  PRN  . Blood Glucose Monitoring Suppl (ONE TOUCH ULTRA 2) W/DEVICE KIT 1 Device by Does not apply route once.  1 each  0  . ciprofloxacin (CIPRO) 500 MG tablet Take 1 tablet (500 mg total) by mouth 2 (two) times daily.  20 tablet  0  . clobetasol ointment (TEMOVATE) 0.26 % Apply 1 application topically at bedtime. To palms  30 g  0  . diphenhydrAMINE (BENADRYL) 25 mg capsule Take 25 mg by mouth every 6 (six) hours as needed. For itching      . docusate sodium (COLACE) 100 MG capsule Take 100 mg by mouth 2 (two) times daily.       . DULoxetine (CYMBALTA) 20 MG capsule Take 1 capsule (20 mg total) by mouth daily.  30 capsule  3  . DUREZOL 0.05 % EMUL Place 2 drops into the right eye 2 (two) times daily.       Marland Kitchen erythromycin (E-MYCIN) 250 MG tablet Take 1 tablet (250 mg total) by mouth 3 (three) times daily before meals.  90 tablet  0  . esomeprazole (NEXIUM) 40 MG capsule Take 1 capsule (40  mg total) by mouth daily before breakfast.  90 capsule  3  . glucose blood (ONE TOUCH ULTRA TEST) test strip Use 4 times daily.  100 each  12  . HYDROcodone-acetaminophen (NORCO/VICODIN) 5-325 MG per tablet Take 1-2 tablets by mouth every 6 (six) hours as needed for moderate pain.  20 tablet  0  . hydrocortisone cream 0.5 % Apply 1 application topically 2 (two) times daily. ONLY USE FOR 1 WEEK  30 g  0  . Hypromellose (ARTIFICIAL TEARS OP) Place 1 drop into both eyes daily as needed. For dry eyes      . insulin aspart (NOVOLOG FLEXPEN) 100 UNIT/ML FlexPen Inject 15 Units into the skin 3 (three) times daily with meals. QS for 1 month supply  15 mL  11  . Insulin Glargine (LANTUS) 100 UNIT/ML Solostar Pen Inject 36 Units into the skin 2 (two) times daily.  15 mL  11  . Lancets (ONETOUCH ULTRASOFT) lancets Use 4 times daily to check cbg  100 each  12  . levocetirizine (XYZAL) 5 MG tablet Take 1 tablet (5 mg total) by mouth every evening.  [place]  tablet  3  . lidocaine-prilocaine (EMLA) cream Apply 1 application topically as needed. Apply to right thumb as needed  30 g  0  . lisinopril (PRINIVIL,ZESTRIL) 40 MG tablet Take 1 tablet (40 mg total) by mouth daily.  30 tablet  6  . meloxicam (MOBIC) 7.5 MG tablet Take 1 tablet (7.5 mg total) by mouth daily.  30 tablet  3  . metoCLOPramide (REGLAN) 10 MG tablet Take 1 tablet (10 mg total) by mouth 3 (three) times daily before meals.  90 tablet  3  . metoprolol (LOPRESSOR) 100 MG tablet Take 2 tablets (200 mg total) by mouth 2 (two) times daily.  120 tablet  6  . metroNIDAZOLE (FLAGYL) 500 MG tablet Take 1 tablet (500 mg total) by mouth 3 (three) times daily.  30 tablet  0  . montelukast (SINGULAIR) 10 MG tablet take 1 tablet by mouth at bedtime  90 tablet  1  . nicotine polacrilex (NICORETTE) 4 MG gum Take 4 mg by mouth as needed for smoking cessation (cravings). Take up to 10 pieces per day      . nortriptyline (PAMELOR) 25 MG capsule Take 1 capsule (25 mg  total) by mouth at bedtime.  30 capsule  3  . nystatin (MYCOSTATIN) 100000 UNIT/ML suspension Take 5 mLs (500,000 Units total) by mouth 2 (two) times daily. Swish and swallow  60 mL  1  . nystatin (MYCOSTATIN) powder Apply topically 3 (three) times daily. To affected area  30 g  3  . olopatadine (PATANOL) 0.1 % ophthalmic solution Place 1 drop into both eyes 2 (two) times daily.  5 mL  12  . ondansetron (ZOFRAN) 4 MG tablet Take 1 tablet (4 mg total) by mouth every 8 (eight) hours as needed for nausea or vomiting. For nausea  30 tablet  3  . oxyCODONE-acetaminophen (ROXICET) 5-325 MG per tablet Take 1 tablet by mouth every 8 (eight) hours as needed for severe pain.  20 tablet  0  . pentosan polysulfate (ELMIRON) 100 MG capsule Take 1 capsule (100 mg total) by mouth 3 (three) times daily before meals.  270 capsule  1  . phenazopyridine (PYRIDIUM) 200 MG tablet Take 200 mg by mouth 3 (three) times daily as needed (bladder pain).       . polyethylene glycol (GLYCOLAX) packet Take 17 g by mouth every 3 (three) days. Mix in 4-6 oz of water      . prednisoLONE acetate (PRED FORTE) 1 % ophthalmic suspension Place 1 drop into both eyes 2 (two) times daily.       . pregabalin (LYRICA) 150 MG capsule Take 1 capsule (150 mg total) by mouth 2 (two) times daily.  60 capsule  3  . QUEtiapine (SEROQUEL) 200 MG tablet Take 0.5 tablets (100 mg total) by mouth at bedtime.  30 tablet  6  . ranitidine (ZANTAC) 150 MG tablet Take 150 mg by mouth daily as needed. For acid reflux       No current facility-administered medications on file prior to visit.    I have reviewed and updated the following as appropriate: allergies and current medications SHx: current smoker   Review of Systems See HPI    Objective:   Physical Exam BP 155/70  Pulse 82  Temp(Src) 98.1 F (36.7 C) (Oral)  Ht 5' 3"  (1.6 m)  Wt 240 lb 8 oz (109.09 kg)  BMI 42.61 kg/m2 Gen: alert, cooperative, NAD Abd: +BS, soft, ND, no point  tenderness Ext: trace  edema bilaterally     Assessment & Plan:

## 2013-10-11 ENCOUNTER — Ambulatory Visit (INDEPENDENT_AMBULATORY_CARE_PROVIDER_SITE_OTHER): Payer: Commercial Managed Care - HMO | Admitting: Emergency Medicine

## 2013-10-11 ENCOUNTER — Encounter: Payer: Self-pay | Admitting: Emergency Medicine

## 2013-10-11 VITALS — BP 155/64 | HR 90 | Temp 99.5°F | Ht 63.0 in | Wt 241.0 lb

## 2013-10-11 DIAGNOSIS — H6090 Unspecified otitis externa, unspecified ear: Secondary | ICD-10-CM | POA: Insufficient documentation

## 2013-10-11 DIAGNOSIS — IMO0001 Reserved for inherently not codable concepts without codable children: Secondary | ICD-10-CM

## 2013-10-11 DIAGNOSIS — H6091 Unspecified otitis externa, right ear: Secondary | ICD-10-CM

## 2013-10-11 DIAGNOSIS — M797 Fibromyalgia: Secondary | ICD-10-CM

## 2013-10-11 DIAGNOSIS — H60399 Other infective otitis externa, unspecified ear: Secondary | ICD-10-CM

## 2013-10-11 DIAGNOSIS — G894 Chronic pain syndrome: Secondary | ICD-10-CM

## 2013-10-11 DIAGNOSIS — R05 Cough: Secondary | ICD-10-CM

## 2013-10-11 DIAGNOSIS — R059 Cough, unspecified: Secondary | ICD-10-CM

## 2013-10-11 DIAGNOSIS — R1013 Epigastric pain: Secondary | ICD-10-CM

## 2013-10-11 MED ORDER — CIPROFLOXACIN-DEXAMETHASONE 0.3-0.1 % OT SUSP: 4.0000 [drp] | Freq: Two times a day (BID) | OTIC | Status: DC

## 2013-10-11 MED ORDER — KETOROLAC TROMETHAMINE 30 MG/ML IJ SOLN: 30.0000 mg | Freq: Four times a day (QID) | INTRAMUSCULAR | Status: DC

## 2013-10-11 MED ORDER — ERYTHROMYCIN BASE 250 MG PO TABS: 250.0000 mg | ORAL_TABLET | Freq: Three times a day (TID) | ORAL | Status: DC

## 2013-10-11 MED ORDER — METOPROLOL TARTRATE 100 MG PO TABS: 200.0000 mg | ORAL_TABLET | Freq: Two times a day (BID) | ORAL | Status: DC

## 2013-10-11 MED ORDER — DIFLUPREDNATE 0.05 % OP EMUL: 2.0000 [drp] | Freq: Two times a day (BID) | OPHTHALMIC | Status: DC

## 2013-10-11 MED ORDER — OLOPATADINE HCL 0.1 % OP SOLN: 1.0000 [drp] | Freq: Two times a day (BID) | OPHTHALMIC | Status: DC

## 2013-10-11 MED ORDER — ESOMEPRAZOLE MAGNESIUM 40 MG PO CPDR: 40.0000 mg | DELAYED_RELEASE_CAPSULE | Freq: Every day | ORAL | Status: DC

## 2013-10-11 MED ORDER — KETOROLAC TROMETHAMINE 30 MG/ML IM SOLN
60.0000 mg | Freq: Once | INTRAMUSCULAR | Status: AC
  Administered 2013-10-11: 60 mg via INTRAMUSCULAR

## 2013-10-11 MED ORDER — PREGABALIN 150 MG PO CAPS: 150.0000 mg | ORAL_CAPSULE | Freq: Three times a day (TID) | ORAL | Status: DC

## 2013-10-11 MED ORDER — PREDNISOLONE ACETATE 1 % OP SUSP: 1.0000 [drp] | Freq: Two times a day (BID) | OPHTHALMIC | Status: DC

## 2013-10-11 NOTE — Patient Instructions (Addendum)
It was nice to see you!  I think you have swimmer's ear.  Put 4 drops of the Ciprodex into the right ear twice a day.  Increase the Lyrica to 1 tablet (150mg ) 3 times a day.  I have refilled your eye drops.  Follow up in 1 month.

## 2013-10-11 NOTE — Assessment & Plan Note (Signed)
Increase Lyrica to 150 mg 3 times a day. This is the max dose. Followup in one month.

## 2013-10-11 NOTE — Assessment & Plan Note (Signed)
Ciprodex for 10 days.

## 2013-10-11 NOTE — Assessment & Plan Note (Signed)
Resolved

## 2013-10-11 NOTE — Progress Notes (Signed)
Subjective:    Patient ID: [name] [name], female    DOB: 08/15/1950, 63 y.o.   MRN: [id number]  HPI [name] [name] is here for followup abdominal pain and several other issues.  Abdominal pain This has resolved with the completion of her antibiotics. She states the bloody diarrhea is gone.  Right-sided pain Reports her typical fibromyalgia type pain along her right side. Described as sharp pains. She did take an extra Lyrica yesterday, which she says helped significantly with the pain.  Right eye discomfort She states her right eye has been dry and itchy. She is out of several of her eyedrops. She does need to go see her eye doctor, however she does not have the money to do this right away. 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 She does have a little bit of sensitivity to light with the right eye.  Right ear She reports some pain and itching in her right ear for the last several days. She states she regularly cleans her ears with Q-tips.  Current Outpatient Prescriptions on File Prior to Visit  Medication Sig Dispense Refill  . albuterol (PROVENTIL HFA;VENTOLIN HFA) 108 (90 BASE) MCG/ACT inhaler Inhale 2 puffs into the lungs every 4 (four) hours as needed. For shortness of breath  8.5 g  1  . albuterol (PROVENTIL) (2.5 MG/3ML) 0.083% nebulizer solution Take 3 mLs (2.5 mg total) by nebulization every 6 (six) hours as needed for wheezing.  150 mL  1  . ALREX 0.2 % SUSP 1-2 drops as needed (itching).       Marland Kitchen aspirin EC 81 MG tablet Take 81 mg by mouth every morning.       . Aspirin-Acetaminophen-Caffeine (EXCEDRIN MIGRAINE PO) Take 2 capsules by mouth 3 (three) times daily as needed. For migraine      . atorvastatin (LIPITOR) 20 MG tablet Take 20 mg by mouth every morning.      . B-D ULTRAFINE III SHORT PEN 31G X 8 MM MISC USE 5 TIMES A DAY, WITH LANTUS AND NOVOLOG  100 each  PRN  . beclomethasone (QVAR)  80 MCG/ACT inhaler Inhale 2 puffs into the lungs 2 (two) times daily.  1 Inhaler  12  . Blood Glucose Monitoring Suppl (ONE TOUCH ULTRA 2) W/DEVICE KIT 1 Device by Does not apply route once.  1 each  0  . clobetasol ointment (TEMOVATE) 3.38 % Apply 1 application topically at bedtime. To palms  30 g  0  . diphenhydrAMINE (BENADRYL) 25 mg capsule Take 25 mg by mouth every 6 (six) hours as needed. For itching      . docusate sodium (COLACE) 100 MG capsule Take 100 mg by mouth 2 (two) times daily.       . DULoxetine (CYMBALTA) 20 MG capsule Take 1 capsule (20 mg total) by mouth daily.  30 capsule  3  . furosemide (LASIX) 20 MG tablet Take 1 tablet (20 mg total) by mouth daily as needed for fluid or edema.  30 tablet  3  . glucose blood (ONE TOUCH ULTRA TEST) test strip Use 4 times daily.  100 each  12  . hydrochlorothiazide (HYDRODIURIL) 25 MG tablet Take 1 tablet (25 mg total) by mouth every morning.  90 tablet  3  . HYDROcodone-acetaminophen (NORCO/VICODIN) 5-325 MG per tablet Take 1-2 tablets by mouth every 6 (six) hours as needed for moderate pain.  20 tablet  0  . hydrocortisone cream 0.5 % Apply 1 application topically 2 (two) times daily. ONLY USE FOR 1 WEEK  30 g  0  . Hypromellose (ARTIFICIAL TEARS OP) Place 1 drop into both eyes daily as needed. For dry eyes      . insulin aspart (NOVOLOG FLEXPEN) 100 UNIT/ML FlexPen Inject 15 Units into the skin 3 (three) times daily with meals. QS for 1 month supply  15 mL  11  . Insulin Glargine (LANTUS) 100 UNIT/ML Solostar Pen Inject 36 Units into the skin 2 (two) times daily.  15 mL  11  . Lancets (ONETOUCH ULTRASOFT) lancets Use 4 times daily to check cbg  100 each  12  . levocetirizine (XYZAL) 5 MG tablet Take 1 tablet (5 mg total) by mouth every evening.  30 tablet  3  . lisinopril (PRINIVIL,ZESTRIL) 40 MG tablet Take 1 tablet (40 mg total) by mouth daily.  30 tablet  6  . meloxicam (MOBIC) 7.5 MG tablet Take 1 tablet (7.5 mg total) by mouth daily.   30 tablet  3  . metoCLOPramide (REGLAN) 10 MG tablet Take 1 tablet (10 mg total) by mouth 3 (three) times daily before meals.  90 tablet  3  . montelukast (SINGULAIR) 10 MG tablet take 1 tablet by mouth at bedtime  90 tablet  1  . mupirocin ointment (BACTROBAN) 2 % Apply topically 3 (three) times daily. For 2 weeks.  22 g  0  . nicotine polacrilex (NICORETTE) 4 MG gum Take 4 mg by mouth as needed for smoking cessation (cravings). Take up to 10 pieces per day      . nortriptyline (PAMELOR) 25 MG capsule Take 1 capsule (25 mg total) by mouth at bedtime.  30 capsule  3  . nystatin (MYCOSTATIN) 100000 UNIT/ML suspension Take 5 mLs (500,000 Units total) by mouth 2 (two) times daily. Swish and swallow  60 mL  1  . nystatin (MYCOSTATIN) powder Apply topically 3 (three) times daily. To affected area  30 g  3  . ondansetron (ZOFRAN) 4 MG tablet Take 1 tablet (4 mg total) by mouth every 8 (eight) hours as needed for nausea or vomiting. For nausea  30 tablet  3  . oxyCODONE-acetaminophen (ROXICET) 5-325 MG per tablet Take 1 tablet by mouth every 8 (eight) hours as needed for severe pain.  20 tablet  0  . pentosan polysulfate (ELMIRON) 100 MG capsule Take 1 capsule (100 mg total) by mouth 3 (three) times daily before meals.  270 capsule  1  . phenazopyridine (PYRIDIUM) 200 MG tablet Take 200 mg by mouth 3 (three) times daily as needed (bladder pain).       . polyethylene glycol (GLYCOLAX) packet Take 17 g by mouth every 3 (three) days. Mix in 4-6 oz of water      . promethazine (PHENERGAN) 25 MG tablet Take 1 tablet (25 mg total) by mouth every 8 (eight) hours as needed for nausea or vomiting.  20 tablet  0  . QUEtiapine (SEROQUEL) 200 MG tablet Take 0.5 tablets (100 mg total) by mouth at bedtime.  30 tablet  6  . ranitidine (ZANTAC) 150 MG tablet Take 150 mg by mouth daily as needed. For acid reflux       No current facility-administered medications on file prior to visit.    I have reviewed and updated the  following as appropriate: allergies and current medications SHx: current smoker; working on quitting   Review of Systems See HPI  Objective:   Physical Exam BP 155/64  Pulse 90  Temp(Src) 99.5 F (37.5 C) (Oral)  Ht 5' 3" (1.6 m)  Wt 241 lb (109.317 kg)  BMI 42.70 kg/m2 Gen: alert, cooperative, NAD Ears: TMs normal bilaterally; tender with manipulation of tragus on right side Eyes: mild conjunctival irritation on right Neuro: 5/5 strength in bilateral upper extremities; sensation grossly intact in upper extremities     Assessment & Plan:  Eye issues: I have refilled her eyedrops. I also strongly encouraged her to followup with her ophthalmologist.

## 2013-10-14 ENCOUNTER — Encounter: Payer: Self-pay | Admitting: Pharmacist

## 2013-10-14 ENCOUNTER — Ambulatory Visit (INDEPENDENT_AMBULATORY_CARE_PROVIDER_SITE_OTHER): Payer: Commercial Managed Care - HMO | Admitting: Pharmacist

## 2013-10-14 VITALS — BP 172/82 | HR 81 | Ht 64.0 in | Wt 238.0 lb

## 2013-10-14 DIAGNOSIS — IMO0002 Reserved for concepts with insufficient information to code with codable children: Secondary | ICD-10-CM

## 2013-10-14 DIAGNOSIS — Z7189 Other specified counseling: Secondary | ICD-10-CM

## 2013-10-14 DIAGNOSIS — E1165 Type 2 diabetes mellitus with hyperglycemia: Secondary | ICD-10-CM

## 2013-10-14 DIAGNOSIS — Z716 Tobacco abuse counseling: Secondary | ICD-10-CM

## 2013-10-14 DIAGNOSIS — F17200 Nicotine dependence, unspecified, uncomplicated: Secondary | ICD-10-CM

## 2013-10-14 DIAGNOSIS — IMO0001 Reserved for inherently not codable concepts without codable children: Secondary | ICD-10-CM

## 2013-10-14 MED ORDER — ACCU-CHEK COMPACT PLUS CARE KIT: 1.0000 | PACK | Freq: Four times a day (QID) | Status: DC

## 2013-10-14 MED ORDER — GLUCOSE BLOOD VI STRP: ORAL_STRIP | Status: DC

## 2013-10-14 MED ORDER — INSULIN ASPART 100 UNIT/ML FLEXPEN: 15.0000 [IU] | PEN_INJECTOR | Freq: Three times a day (TID) | SUBCUTANEOUS | Status: DC

## 2013-10-14 NOTE — Progress Notes (Signed)
S:  Patient arrives in good spirits with her grandson.   Patient arrives for evaluation/assistance with tobacco dependence and diabetes follow up. She reports smoking 2 cigarettes per day, but may increase to 4 cigarettes per day on stressful days. She reports using nicotine gum 4 mg up to 8 pieces per day to help with her craving. Her main triggers for tobacco use include stress and coffee.  She also reports lighting cigarettes and keeping them in the ashtray since it is a "psychological thing" for her.  Denies waking up to smoke. She has made significant progress with cutting back and not smoking 1 day out of the week.   For diabetes management, patient reports using Lantus 34 units twice daily (instead of 36 units twice daily) and has been out of Novolog for 3 weeks. She was unable to report blood sugar readings since she lost her meter 3 months ago and still is looking for it. She states she has nocturia (2-4 times per night) and increased thirst. She also states having some nerve pain that has improved with Lyrica, and numbness in toes.  O:  . Lab Results  Component Value Date   HGBA1C 9.7 08/12/2013    A/P: Moderate nicotine dependence who is a good candidate for success because of continual progress made. We discussed nonpharmacological methods of reducing stress and future goals of cutting back to 1 cigarette per day and avoiding a cigarette with coffee by not smoking before 12pm. Plan to continue nicotine 4 mg gum during cravings and counseled on appropriate use. At her next visit, assess cigarette use and readiness to set a quit date.  Longstanding uncontrolled diabetes while using Lantus and Novolog. Denies hypoglycemic events and is able to verbalize appropriate hypoglycemia management plan. Due to not having home blood sugar readings and running out of Novolog for 3 weeks, plan is to continue Novolog 15 units before both lunch and dinner, and increase Lantus to 36 units twice daily. Sent  in a prescription for a new meter and advised testing up to 4 times per day and bringing in her meter at her follow up visit. Next A1c anticipated at the end of July.  Written information provided. F/U Rx Clinic Visit mid July.   Total time in face-to-face counseling 40 minutes.  Patient seen with Whitney Muse, PharmD Candidate; Wilfred Curtis PharmD Resident.

## 2013-10-14 NOTE — Patient Instructions (Signed)
It was good to see you today!  For diabetes: continue Novolog 15 units before lunch and 15 units before dinner. Use Lantus 36 units twice daily. We sent over a prescription for your meter and test strips. Test up to 4 times a day.  Bring in your meter at the next appointment so we can see your blood sugars  For smoking: try to cut down to 1 cigarette a day and try to not have a cigarette before 12 PM.  Follow up with Pharmacy clinic in mid-July

## 2013-11-01 NOTE — Assessment & Plan Note (Signed)
Longstanding uncontrolled diabetes while using Lantus and Novolog. Denies hypoglycemic events and is able to verbalize appropriate hypoglycemia management plan. Due to not having home blood sugar readings and running out of Novolog for 3 weeks, plan is to continue Novolog 15 units before both lunch and dinner, and increase Lantus to 36 units twice daily. Sent in a prescription for a new meter and advised testing up to 4 times per day and bringing in her meter at her follow up visit. Next A1c anticipated at the end of July.

## 2013-11-01 NOTE — Progress Notes (Signed)
Patient ID: [name] [name], female   DOB: 03-18-1951, 63 y.o.   MRN: [id number] Reviewed: Agree with Dr. Graylin Shiver documentation and management.

## 2013-11-01 NOTE — Assessment & Plan Note (Signed)
Moderate nicotine dependence who is a good candidate for success because of continual progress made. We discussed nonpharmacological methods of reducing stress and future goals of cutting back to 1 cigarette per day and avoiding a cigarette with coffee by not smoking before 12pm. Plan to continue nicotine 4 mg gum during cravings and counseled on appropriate use. At her next visit, assess cigarette use and readiness to set a quit date.

## 2013-11-08 ENCOUNTER — Ambulatory Visit (INDEPENDENT_AMBULATORY_CARE_PROVIDER_SITE_OTHER): Payer: Commercial Managed Care - HMO | Admitting: Pharmacist

## 2013-11-08 ENCOUNTER — Encounter: Payer: Self-pay | Admitting: Pharmacist

## 2013-11-08 VITALS — BP 142/65 | HR 78 | Wt 234.6 lb

## 2013-11-08 DIAGNOSIS — E1165 Type 2 diabetes mellitus with hyperglycemia: Secondary | ICD-10-CM

## 2013-11-08 DIAGNOSIS — IMO0001 Reserved for inherently not codable concepts without codable children: Secondary | ICD-10-CM

## 2013-11-08 DIAGNOSIS — IMO0002 Reserved for concepts with insufficient information to code with codable children: Secondary | ICD-10-CM

## 2013-11-08 DIAGNOSIS — K859 Acute pancreatitis without necrosis or infection, unspecified: Secondary | ICD-10-CM

## 2013-11-08 DIAGNOSIS — K861 Other chronic pancreatitis: Secondary | ICD-10-CM

## 2013-11-08 MED ORDER — INSULIN GLARGINE 100 UNIT/ML SOLOSTAR PEN: 36.0000 [IU] | PEN_INJECTOR | Freq: Two times a day (BID) | SUBCUTANEOUS | Status: DC

## 2013-11-08 MED ORDER — HYDROMORPHONE HCL 2 MG PO TABS: 2.0000 mg | ORAL_TABLET | Freq: Four times a day (QID) | ORAL | Status: DC | PRN

## 2013-11-08 NOTE — Patient Instructions (Signed)
Quit smoking date soon.   Please take your insulin as prescribed.   Take your Dilaudid for your pain and see Dr. Wendi Snipes on Thursday.

## 2013-11-08 NOTE — Progress Notes (Signed)
S:  Patient arrives in good spirits, walking without assistance.   Patient arrives for evaluation/assistance with tobacco dependence.   Additional complaints today include 8/10 left abdominal pain she describes as her pancreas pain which has been managed with Dilaudid in the past.   Skin "welps" were also concerning to the patient.   Blood sugar control has been poor lately - reported some decrease dose administration to avoid running out of insulin.   Number of Cigarettes per day 1-3. Rates CONFIDENCE of quitting tobacco on 1-10 scale of 6. Triggers to use tobacco include; stress (pain)     A/P: moderate Nicotine Dependence of longstanding duration in a patient who is fair candidate for success b/c of current level of commitment and success with tapering to lower intake.  Patient willing to make an attempt at quitting in the next few weeks.  Only has 9 cigarettes and was willing to consider NOT purchasing more cigarettes.  Written information provided.   Worsened glycemic control in longstanding diabetes likely related to partial adherence related to stretching doses.   Pain discussed with Dr. Andria Frames - prescribed 2mg  dilaudid #15  Reassess by Dr. Wendi Snipes in two days.   Reevaluate skin lesion as next visit with primary provider.  F/U Rx Clinic Visit   Total time in face-to-face counseling 25 minutes.

## 2013-11-09 NOTE — Progress Notes (Signed)
Patient ID: [name] [name], female   DOB: [birth year], 63 y.o.   MRN: [id number] Reviewed: Agree with Dr. Graylin Shiver documentation and management.  I saw patient and she described pain consistent with a flair of her chronic/recurrent pancreatitis.  I prescribed dilaudid as apparently has been done in the past with success.  Hydromorphone was not on her current med list but it was on her previous med list.  She has three narcotic medications now listed: hydrocodone, oxycodone and hydromorphone.  Recommend that at her next PCP visit that the use and quantity of these pain medicines be discussed and clarified in the chart.

## 2013-11-09 NOTE — Assessment & Plan Note (Signed)
Pain complaints consistent with mild flair.  Treat early to avoid hospitalization.  Patient well aware of how to adjust diet.

## 2013-11-10 ENCOUNTER — Ambulatory Visit (INDEPENDENT_AMBULATORY_CARE_PROVIDER_SITE_OTHER): Payer: Commercial Managed Care - HMO | Admitting: Family Medicine

## 2013-11-10 ENCOUNTER — Encounter: Payer: Self-pay | Admitting: Family Medicine

## 2013-11-10 VITALS — BP 188/71 | HR 95 | Temp 98.1°F | Wt 233.0 lb

## 2013-11-10 DIAGNOSIS — R1013 Epigastric pain: Secondary | ICD-10-CM

## 2013-11-10 DIAGNOSIS — K861 Other chronic pancreatitis: Secondary | ICD-10-CM

## 2013-11-10 DIAGNOSIS — K859 Acute pancreatitis without necrosis or infection, unspecified: Secondary | ICD-10-CM

## 2013-11-10 LAB — CBC WITH DIFFERENTIAL/PLATELET
Basophils Absolute: 0.1 10*3/uL (ref 0.0–0.1)
Basophils Relative: 1 % (ref 0–1)
Eosinophils Absolute: 0.1 10*3/uL (ref 0.0–0.7)
Eosinophils Relative: 2 % (ref 0–5)
HCT: 39.9 % (ref 36.0–46.0)
Hemoglobin: 13 g/dL (ref 12.0–15.0)
Lymphocytes Relative: 37 % (ref 12–46)
Lymphs Abs: 2.3 10*3/uL (ref 0.7–4.0)
MCH: 27.1 pg (ref 26.0–34.0)
MCHC: 32.6 g/dL (ref 30.0–36.0)
MCV: 83.1 fL (ref 78.0–100.0)
Monocytes Absolute: 0.4 10*3/uL (ref 0.1–1.0)
Monocytes Relative: 7 % (ref 3–12)
Neutro Abs: 3.3 10*3/uL (ref 1.7–7.7)
Neutrophils Relative %: 53 % (ref 43–77)
Platelets: 143 10*3/uL — ABNORMAL LOW (ref 150–400)
RBC: 4.8 MIL/uL (ref 3.87–5.11)
RDW: 13.9 % (ref 11.5–15.5)
WBC: 6.2 10*3/uL (ref 4.0–10.5)

## 2013-11-10 LAB — COMPREHENSIVE METABOLIC PANEL
ALT: 22 U/L (ref 0–35)
AST: 23 U/L (ref 0–37)
Albumin: 4 g/dL (ref 3.5–5.2)
Alkaline Phosphatase: 86 U/L (ref 39–117)
BUN: 8 mg/dL (ref 6–23)
CO2: 23 mEq/L (ref 19–32)
Calcium: 8.9 mg/dL (ref 8.4–10.5)
Chloride: 103 mEq/L (ref 96–112)
Creat: 0.8 mg/dL (ref 0.50–1.10)
Glucose, Bld: 170 mg/dL — ABNORMAL HIGH (ref 70–99)
Potassium: 3.8 mEq/L (ref 3.5–5.3)
Sodium: 139 mEq/L (ref 135–145)
Total Bilirubin: 0.4 mg/dL (ref 0.2–1.2)
Total Protein: 7.4 g/dL (ref 6.0–8.3)

## 2013-11-10 LAB — LIPASE: Lipase: 233 U/L — ABNORMAL HIGH (ref 0–75)

## 2013-11-10 MED ORDER — HYDROMORPHONE HCL 4 MG PO TABS: 4.0000 mg | ORAL_TABLET | Freq: Four times a day (QID) | ORAL | Status: DC | PRN

## 2013-11-10 NOTE — Assessment & Plan Note (Signed)
Continuing her Lantus and NovoLog Discussed the possibility of pancreatic failure with pancreatitis, so patient will check blood sugars more frequently over the next few days Discussed to seek help in the ER blood sugars consistently above 300.

## 2013-11-10 NOTE — Patient Instructions (Signed)

## 2013-11-10 NOTE — Assessment & Plan Note (Signed)
Continued pain, most likely due to recurrent pancreatitis Increase Dilaudid to 4 mg every 6 hours, prescription given to cover until Monday, which is 4 days. Discussed in detail red flags and provided them and aVF, patient will seek help in the emergency room if she suddenly worsens or if her pain is uncontrolled with by mouth medication or trying, she will return Monday for followup for this problem. Check labs, including UA considering that some of her pain is suprapubic

## 2013-11-10 NOTE — Progress Notes (Signed)
Patient ID: [name] [name], female   DOB: 12-Feb-1951, 63 y.o.   MRN: [id number]  Kenn File, MD Phone: [phone]  Subjective:  Chief complaint-noted  Pt Here for followup abdominal pain due to pancreatitis  Patient was seen 2 days ago in pharmacy clinic where she mentioned she had 8/10 abdominal pain. She is well-known to the pharmacy provider who discussed her case with Dr. Andria Frames. She was seen and started on Dilaudid PO for recurrent pancreatitis.  She started taking the 2 mg Dilaudid pills which improved her pain some but did not completely alleviate it. She states that she has not been eating solid food for the last day but she is tolerating water and broth easily.  She states she has severe upper abdominal/epigastric pain with shooting pains that radiate around to her back, upper chest, and around down to her suprapubic area. The pain is slightly increased with fluid that she is continuing to tolerate fluids easily. She states that she is urinating at least 4 times a day.  She has not checked her blood sugar since she left pharmacy clinic, but she has continued to take her insulin which is 36 units of Lantus twice daily and 15 units of NovoLog twice daily She denies any hypoglycemic episodes  She has history of ventral and inguinal hernia which she states have been repaired.   Offered admission but she would not like that now.   ROS-  Positive fever and chills over the last 2 weeks No dyspnea Positive abdominal pain radiating into her chest and down to her suprapubic area.  Past Medical History Patient Active Problem List   Diagnosis Date Noted  . Otitis externa 10/11/2013  . Acute sinusitis 08/12/2013  . Candidal intertrigo 05/11/2013  . Dyshidrotic hand dermatitis 05/11/2013  . Fibromyalgia 02/14/2013  . Diabetic peripheral neuropathy 02/14/2013  . Ingrown nail 01/04/2013  . H/O ventral hernia repair 08/13/2012  . Benign neoplasm of colon 12/31/2011  .  Epigastric abdominal pain 12/29/2011  . Cough 12/17/2011  . CAD (coronary artery disease) 11/25/2011  . Annual physical exam 11/04/2011  . Chest pain 10/31/2011  . Neuropathy 03/01/2011  . Tinea pedis 02/17/2011  . Palpitations 01/24/2011  . Umbilical hernia 03/50/0938  . Diabetic gastroparesis associated with type 2 diabetes mellitus 11/07/2010  . Pancreatitis, recurrent 11/07/2010  . Anemia 09/25/2010  . Allergic rhinitis 06/28/2010  . CONSTIPATION 02/26/2010  . DM (diabetes mellitus), type 2, uncontrolled 09/10/2009  . DEPRESSION, MAJOR, WITH PSYCHOTIC BEHAVIOR 08/14/2009  . Chronic pain syndrome 08/14/2009  . CATARACT EXTRACTIONS, BILATERAL, HX OF 08/14/2009  . Tobacco abuse counseling 12/30/2008  . GASTROESOPHAGEAL REFLUX DISEASE, CHRONIC 02/11/2008  . HYPERTENSION, BENIGN ESSENTIAL 10/21/2006  . URINARY INCONTINENCE 10/21/2006    Medications- reviewed and updated Current Outpatient Prescriptions  Medication Sig Dispense Refill  . albuterol (PROVENTIL HFA;VENTOLIN HFA) 108 (90 BASE) MCG/ACT inhaler Inhale 2 puffs into the lungs every 4 (four) hours as needed. For shortness of breath  8.5 g  1  . albuterol (PROVENTIL) (2.5 MG/3ML) 0.083% nebulizer solution Take 3 mLs (2.5 mg total) by nebulization every 6 (six) hours as needed for wheezing.  150 mL  1  . ALREX 0.2 % SUSP 1-2 drops as needed (itching).       Marland Kitchen aspirin EC 81 MG tablet Take 81 mg by mouth every morning.       . Aspirin-Acetaminophen-Caffeine (EXCEDRIN MIGRAINE PO) Take 2 capsules by mouth 3 (three) times daily as needed. For migraine      .  atorvastatin (LIPITOR) 20 MG tablet Take 20 mg by mouth every morning.      . B-D ULTRAFINE III SHORT PEN 31G X 8 MM MISC USE 5 TIMES A DAY, WITH LANTUS AND NOVOLOG  100 each  PRN  . beclomethasone (QVAR) 80 MCG/ACT inhaler Inhale 2 puffs into the lungs 2 (two) times daily.  1 Inhaler  12  . Blood Glucose Monitoring Suppl (ACCU-CHEK COMPACT CARE KIT) KIT 1 strip by Does not  apply route 4 (four) times daily.  120 each  11  . ciprofloxacin-dexamethasone (CIPRODEX) otic suspension Place 4 drops into the right ear 2 (two) times daily. For 10 days.  7.5 mL  0  . clobetasol ointment (TEMOVATE) 5.28 % Apply 1 application topically at bedtime. To palms  30 g  0  . Difluprednate (DUREZOL) 0.05 % EMUL Place 2 drops into the right eye 2 (two) times daily.  5 mL  6  . diphenhydrAMINE (BENADRYL) 25 mg capsule Take 25 mg by mouth every 6 (six) hours as needed. For itching      . docusate sodium (COLACE) 100 MG capsule Take 100 mg by mouth 2 (two) times daily.       . DULoxetine (CYMBALTA) 20 MG capsule Take 1 capsule (20 mg total) by mouth daily.  30 capsule  3  . erythromycin (E-MYCIN) 250 MG tablet Take 1 tablet (250 mg total) by mouth 3 (three) times daily before meals.  90 tablet  3  . esomeprazole (NEXIUM) 40 MG capsule Take 1 capsule (40 mg total) by mouth daily before breakfast.  90 capsule  3  . furosemide (LASIX) 20 MG tablet Take 1 tablet (20 mg total) by mouth daily as needed for fluid or edema.  30 tablet  3  . glucose blood (ACCU-CHEK COMPACT PLUS) test strip Use to test four times a day. Diagnosis code: 250.02  100 each  12  . hydrochlorothiazide (HYDRODIURIL) 25 MG tablet Take 1 tablet (25 mg total) by mouth every morning.  90 tablet  3  . HYDROcodone-acetaminophen (NORCO/VICODIN) 5-325 MG per tablet Take 1-2 tablets by mouth every 6 (six) hours as needed for moderate pain.  20 tablet  0  . hydrocortisone cream 0.5 % Apply 1 application topically 2 (two) times daily. ONLY USE FOR 1 WEEK  30 g  0  . HYDROmorphone (DILAUDID) 4 MG tablet Take 1 tablet (4 mg total) by mouth every 6 (six) hours as needed for severe pain.  15 tablet  0  . Hypromellose (ARTIFICIAL TEARS OP) Place 1 drop into both eyes daily as needed. For dry eyes      . insulin aspart (NOVOLOG FLEXPEN) 100 UNIT/ML FlexPen Inject 15 Units into the skin 3 (three) times daily with meals. QS for 1 month supply   2 pen  0  . Insulin Glargine (LANTUS) 100 UNIT/ML Solostar Pen Inject 36 Units into the skin 2 (two) times daily.  3 mL  0  . Lancets (ONETOUCH ULTRASOFT) lancets Use 4 times daily to check cbg  100 each  12  . levocetirizine (XYZAL) 5 MG tablet Take 1 tablet (5 mg total) by mouth every evening.  30 tablet  3  . lidocaine-prilocaine (EMLA) cream Apply 1 application topically as needed (TID).      Marland Kitchen lisinopril (PRINIVIL,ZESTRIL) 40 MG tablet Take 1 tablet (40 mg total) by mouth daily.  30 tablet  6  . meloxicam (MOBIC) 7.5 MG tablet Take 1 tablet (7.5 mg total) by mouth daily.  30 tablet  3  . metoCLOPramide (REGLAN) 10 MG tablet Take 1 tablet (10 mg total) by mouth 3 (three) times daily before meals.  90 tablet  3  . metoprolol (LOPRESSOR) 100 MG tablet Take 2 tablets (200 mg total) by mouth 2 (two) times daily.  120 tablet  6  . montelukast (SINGULAIR) 10 MG tablet take 1 tablet by mouth at bedtime  90 tablet  1  . mupirocin ointment (BACTROBAN) 2 % Apply topically 3 (three) times daily. For 2 weeks.  22 g  0  . naproxen sodium (ANAPROX) 220 MG tablet Take 440 mg by mouth 3 (three) times daily with meals as needed.      . nicotine polacrilex (NICORETTE) 4 MG gum Take 4 mg by mouth as needed for smoking cessation (cravings). Take up to 10 pieces per day      . nortriptyline (PAMELOR) 25 MG capsule Take 1 capsule (25 mg total) by mouth at bedtime.  30 capsule  3  . nystatin (MYCOSTATIN) 100000 UNIT/ML suspension Take 5 mLs (500,000 Units total) by mouth 2 (two) times daily. Swish and swallow  60 mL  1  . nystatin (MYCOSTATIN) powder Apply topically 3 (three) times daily. To affected area  30 g  3  . olopatadine (PATANOL) 0.1 % ophthalmic solution Place 1 drop into both eyes 2 (two) times daily.  5 mL  12  . ondansetron (ZOFRAN) 4 MG tablet Take 1 tablet (4 mg total) by mouth every 8 (eight) hours as needed for nausea or vomiting. For nausea  30 tablet  3  . oxyCODONE-acetaminophen (ROXICET) 5-325 MG  per tablet Take 1 tablet by mouth every 8 (eight) hours as needed for severe pain.  20 tablet  0  . pentosan polysulfate (ELMIRON) 100 MG capsule Take 1 capsule (100 mg total) by mouth 3 (three) times daily before meals.  270 capsule  1  . phenazopyridine (PYRIDIUM) 200 MG tablet Take 200 mg by mouth 3 (three) times daily as needed (bladder pain).       . polyethylene glycol (GLYCOLAX) packet Take 17 g by mouth every 3 (three) days. Mix in 4-6 oz of water      . prednisoLONE acetate (PRED FORTE) 1 % ophthalmic suspension Place 1 drop into both eyes 2 (two) times daily.  5 mL  6  . pregabalin (LYRICA) 150 MG capsule Take 1 capsule (150 mg total) by mouth 3 (three) times daily.  90 capsule  3  . promethazine (PHENERGAN) 25 MG tablet Take 1 tablet (25 mg total) by mouth every 8 (eight) hours as needed for nausea or vomiting.  20 tablet  0  . QUEtiapine (SEROQUEL) 200 MG tablet Take 0.5 tablets (100 mg total) by mouth at bedtime.  30 tablet  6  . ranitidine (ZANTAC) 150 MG tablet Take 150 mg by mouth daily as needed. For acid reflux       No current facility-administered medications for this visit.    Objective: BP 188/71  Pulse 95  Temp(Src) 98.1 F (36.7 C) (Oral)  Wt 233 lb (105.688 kg) Gen: NAD, alert, cooperative with exam HEENT: NCAT CV: RRR, good S1/S2, no murmur Resp: Nonlabored, inspiratory wheezes, good air movement Abd: Soft, positive BS, tenderness throughout worse in the epigastric area and suprapubic area Ext: No edema, warm Neuro: Alert and oriented, No gross deficits   Assessment/Plan:  Pancreatitis, recurrent Continued pain, most likely due to recurrent pancreatitis Increase Dilaudid to 4 mg every 6 hours, prescription  given to cover until Monday, which is 4 days. Discussed in detail red flags and provided them and aVF, patient will seek help in the emergency room if she suddenly worsens or if her pain is uncontrolled with by mouth medication or trying, she will return  Monday for followup for this problem. Check labs, including UA considering that some of her pain is suprapubic  DM (diabetes mellitus), type 2, uncontrolled Continuing her Lantus and NovoLog Discussed the possibility of pancreatic failure with pancreatitis, so patient will check blood sugars more frequently over the next few days Discussed to seek help in the ER blood sugars consistently above 300.    Orders Placed This Encounter  Procedures  . Lipase  . CBC with Differential  . Comprehensive metabolic panel    Meds ordered this encounter  Medications  . HYDROmorphone (DILAUDID) 4 MG tablet    Sig: Take 1 tablet (4 mg total) by mouth every 6 (six) hours as needed for severe pain.    Dispense:  15 tablet    Refill:  0

## 2013-11-14 ENCOUNTER — Inpatient Hospital Stay (HOSPITAL_COMMUNITY)
Admission: AD | Admit: 2013-11-14 | Discharge: 2013-11-18 | DRG: 439 | Disposition: A | Discharge status: Home / Self Care | Payer: Commercial Managed Care - HMO | Source: Ambulatory Visit | Attending: Family Medicine | Admitting: Family Medicine

## 2013-11-14 ENCOUNTER — Other Ambulatory Visit: Payer: Self-pay

## 2013-11-14 ENCOUNTER — Ambulatory Visit (INDEPENDENT_AMBULATORY_CARE_PROVIDER_SITE_OTHER): Payer: Commercial Managed Care - HMO | Admitting: Family Medicine

## 2013-11-14 ENCOUNTER — Inpatient Hospital Stay (HOSPITAL_COMMUNITY): Payer: Commercial Managed Care - HMO

## 2013-11-14 ENCOUNTER — Encounter (HOSPITAL_COMMUNITY): Payer: Self-pay | Admitting: General Practice

## 2013-11-14 ENCOUNTER — Encounter: Payer: Self-pay | Admitting: Family Medicine

## 2013-11-14 VITALS — BP 138/84 | HR 63 | Temp 98.4°F | Resp 18 | Wt 176.0 lb

## 2013-11-14 DIAGNOSIS — J42 Unspecified chronic bronchitis: Secondary | ICD-10-CM | POA: Diagnosis present

## 2013-11-14 DIAGNOSIS — E1142 Type 2 diabetes mellitus with diabetic polyneuropathy: Secondary | ICD-10-CM | POA: Diagnosis present

## 2013-11-14 DIAGNOSIS — K859 Acute pancreatitis without necrosis or infection, unspecified: Secondary | ICD-10-CM | POA: Diagnosis present

## 2013-11-14 DIAGNOSIS — F411 Generalized anxiety disorder: Secondary | ICD-10-CM | POA: Diagnosis present

## 2013-11-14 DIAGNOSIS — K861 Other chronic pancreatitis: Secondary | ICD-10-CM

## 2013-11-14 DIAGNOSIS — K7689 Other specified diseases of liver: Secondary | ICD-10-CM | POA: Diagnosis present

## 2013-11-14 DIAGNOSIS — N179 Acute kidney failure, unspecified: Secondary | ICD-10-CM | POA: Diagnosis present

## 2013-11-14 DIAGNOSIS — F329 Major depressive disorder, single episode, unspecified: Secondary | ICD-10-CM | POA: Diagnosis present

## 2013-11-14 DIAGNOSIS — E669 Obesity, unspecified: Secondary | ICD-10-CM | POA: Diagnosis present

## 2013-11-14 DIAGNOSIS — I10 Essential (primary) hypertension: Secondary | ICD-10-CM | POA: Diagnosis present

## 2013-11-14 DIAGNOSIS — R52 Pain, unspecified: Secondary | ICD-10-CM

## 2013-11-14 DIAGNOSIS — K8500 Idiopathic acute pancreatitis without necrosis or infection: Secondary | ICD-10-CM

## 2013-11-14 DIAGNOSIS — I2510 Atherosclerotic heart disease of native coronary artery without angina pectoris: Secondary | ICD-10-CM | POA: Diagnosis present

## 2013-11-14 DIAGNOSIS — E1149 Type 2 diabetes mellitus with other diabetic neurological complication: Secondary | ICD-10-CM | POA: Diagnosis present

## 2013-11-14 DIAGNOSIS — Z860100 Personal history of colon polyps, unspecified: Secondary | ICD-10-CM

## 2013-11-14 DIAGNOSIS — R197 Diarrhea, unspecified: Secondary | ICD-10-CM | POA: Diagnosis present

## 2013-11-14 DIAGNOSIS — Z9089 Acquired absence of other organs: Secondary | ICD-10-CM | POA: Diagnosis not present

## 2013-11-14 DIAGNOSIS — K5900 Constipation, unspecified: Secondary | ICD-10-CM | POA: Diagnosis present

## 2013-11-14 DIAGNOSIS — K439 Ventral hernia without obstruction or gangrene: Secondary | ICD-10-CM | POA: Diagnosis present

## 2013-11-14 DIAGNOSIS — R109 Unspecified abdominal pain: Secondary | ICD-10-CM

## 2013-11-14 DIAGNOSIS — F3289 Other specified depressive episodes: Secondary | ICD-10-CM | POA: Diagnosis present

## 2013-11-14 DIAGNOSIS — F17200 Nicotine dependence, unspecified, uncomplicated: Secondary | ICD-10-CM | POA: Diagnosis present

## 2013-11-14 DIAGNOSIS — IMO0001 Reserved for inherently not codable concepts without codable children: Secondary | ICD-10-CM | POA: Diagnosis present

## 2013-11-14 DIAGNOSIS — K219 Gastro-esophageal reflux disease without esophagitis: Secondary | ICD-10-CM | POA: Diagnosis present

## 2013-11-14 DIAGNOSIS — Z794 Long term (current) use of insulin: Secondary | ICD-10-CM

## 2013-11-14 DIAGNOSIS — Z7982 Long term (current) use of aspirin: Secondary | ICD-10-CM

## 2013-11-14 DIAGNOSIS — E785 Hyperlipidemia, unspecified: Secondary | ICD-10-CM | POA: Diagnosis present

## 2013-11-14 LAB — CBC WITH DIFFERENTIAL/PLATELET
Basophils Absolute: 0 10*3/uL (ref 0.0–0.1)
Basophils Relative: 0 % (ref 0–1)
Eosinophils Absolute: 0.1 10*3/uL (ref 0.0–0.7)
Eosinophils Relative: 1 % (ref 0–5)
HCT: 39.4 % (ref 36.0–46.0)
Hemoglobin: 12.7 g/dL (ref 12.0–15.0)
Lymphocytes Relative: 38 % (ref 12–46)
Lymphs Abs: 2.5 10*3/uL (ref 0.7–4.0)
MCH: 27.6 pg (ref 26.0–34.0)
MCHC: 32.2 g/dL (ref 30.0–36.0)
MCV: 85.7 fL (ref 78.0–100.0)
Monocytes Absolute: 0.3 10*3/uL (ref 0.1–1.0)
Monocytes Relative: 5 % (ref 3–12)
Neutro Abs: 3.7 10*3/uL (ref 1.7–7.7)
Neutrophils Relative %: 56 % (ref 43–77)
Platelets: 145 10*3/uL — ABNORMAL LOW (ref 150–400)
RBC: 4.6 MIL/uL (ref 3.87–5.11)
RDW: 14.2 % (ref 11.5–15.5)
WBC: 6.7 10*3/uL (ref 4.0–10.5)

## 2013-11-14 LAB — URINALYSIS, ROUTINE W REFLEX MICROSCOPIC
Bilirubin Urine: NEGATIVE
Glucose, UA: 500 mg/dL — AB
Hgb urine dipstick: NEGATIVE
Ketones, ur: NEGATIVE mg/dL
Leukocytes, UA: NEGATIVE
Nitrite: NEGATIVE
Protein, ur: NEGATIVE mg/dL
Specific Gravity, Urine: 1.01 (ref 1.005–1.030)
Urobilinogen, UA: 0.2 mg/dL (ref 0.0–1.0)
pH: 5.5 (ref 5.0–8.0)

## 2013-11-14 LAB — GLUCOSE, CAPILLARY
Glucose-Capillary: 304 mg/dL — ABNORMAL HIGH (ref 70–99)
Glucose-Capillary: 127 mg/dL — ABNORMAL HIGH (ref 70–99)
Glucose-Capillary: 125 mg/dL — ABNORMAL HIGH (ref 70–99)

## 2013-11-14 LAB — TROPONIN I
Troponin I: <0.3 ng/mL (ref <0.30)
Troponin I: <0.3 ng/mL (ref <0.30)

## 2013-11-14 LAB — COMPREHENSIVE METABOLIC PANEL
ALT: 26 U/L (ref 0–35)
AST: 19 U/L (ref 0–37)
Albumin: 3.5 g/dL (ref 3.5–5.2)
Alkaline Phosphatase: 106 U/L (ref 39–117)
Anion gap: 13 (ref 5–15)
BUN: 8 mg/dL (ref 6–23)
CO2: 27 mEq/L (ref 19–32)
Calcium: 8.5 mg/dL (ref 8.4–10.5)
Chloride: 98 mEq/L (ref 96–112)
Creatinine, Ser: 0.76 mg/dL (ref 0.50–1.10)
GFR calc Af Amer: >90 mL/min (ref >90)
GFR calc non Af Amer: 87 mL/min — ABNORMAL LOW (ref >90)
Glucose, Bld: 271 mg/dL — ABNORMAL HIGH (ref 70–99)
Potassium: 4.2 mEq/L (ref 3.7–5.3)
Sodium: 138 mEq/L (ref 137–147)
Total Bilirubin: 0.3 mg/dL (ref 0.3–1.2)
Total Protein: 7.3 g/dL (ref 6.0–8.3)

## 2013-11-14 LAB — HEMOGLOBIN A1C
Hgb A1c MFr Bld: 10.8 % — ABNORMAL HIGH (ref <5.7)
Mean Plasma Glucose: 263 mg/dL — ABNORMAL HIGH (ref <117)

## 2013-11-14 LAB — LIPASE, BLOOD: Lipase: 134 U/L — ABNORMAL HIGH (ref 11–59)

## 2013-11-14 LAB — PRO B NATRIURETIC PEPTIDE: Pro B Natriuretic peptide (BNP): 44.1 pg/mL (ref 0–125)

## 2013-11-14 MED ORDER — IOHEXOL 300 MG/ML  SOLN
100.0000 mL | Freq: Once | INTRAMUSCULAR | Status: AC | PRN
  Administered 2013-11-14: 100 mL via INTRAVENOUS

## 2013-11-14 MED ORDER — SODIUM CHLORIDE 0.9 % IV SOLN
INTRAVENOUS | Status: DC
  Administered 2013-11-14 – 2013-11-17 (×7): via INTRAVENOUS

## 2013-11-14 MED ORDER — DIPHENHYDRAMINE HCL 12.5 MG/5ML PO ELIX: 12.5000 mg | ORAL_SOLUTION | Freq: Four times a day (QID) | ORAL | Status: DC | PRN

## 2013-11-14 MED ORDER — ERYTHROMYCIN BASE 250 MG PO TABS
250.0000 mg | ORAL_TABLET | Freq: Three times a day (TID) | ORAL | Status: DC
  Administered 2013-11-14 – 2013-11-18 (×12): 250 mg via ORAL
  Filled 2013-11-14 (×15): qty 1

## 2013-11-14 MED ORDER — LEVOCETIRIZINE DIHYDROCHLORIDE 5 MG PO TABS: 5.0000 mg | ORAL_TABLET | Freq: Every evening | ORAL | Status: DC

## 2013-11-14 MED ORDER — QUETIAPINE FUMARATE 100 MG PO TABS
100.0000 mg | ORAL_TABLET | Freq: Every day | ORAL | Status: DC
  Administered 2013-11-14 – 2013-11-17 (×4): 100 mg via ORAL
  Filled 2013-11-14 (×5): qty 1

## 2013-11-14 MED ORDER — NICOTINE 7 MG/24HR TD PT24
7.0000 mg | MEDICATED_PATCH | Freq: Every day | TRANSDERMAL | Status: DC
  Administered 2013-11-14 – 2013-11-17 (×4): 7 mg via TRANSDERMAL
  Filled 2013-11-14 (×6): qty 1

## 2013-11-14 MED ORDER — PANTOPRAZOLE SODIUM 40 MG PO TBEC
80.0000 mg | DELAYED_RELEASE_TABLET | Freq: Every day | ORAL | Status: DC
  Administered 2013-11-15 – 2013-11-18 (×4): 80 mg via ORAL
  Filled 2013-11-14 (×5): qty 2

## 2013-11-14 MED ORDER — DIFLUPREDNATE 0.05 % OP EMUL: 2.0000 [drp] | Freq: Two times a day (BID) | OPHTHALMIC | Status: DC

## 2013-11-14 MED ORDER — ATORVASTATIN CALCIUM 20 MG PO TABS
20.0000 mg | ORAL_TABLET | Freq: Every day | ORAL | Status: DC
  Administered 2013-11-14: 20 mg via ORAL
  Filled 2013-11-14 (×2): qty 1

## 2013-11-14 MED ORDER — INSULIN GLARGINE 100 UNIT/ML ~~LOC~~ SOLN
20.0000 [IU] | Freq: Two times a day (BID) | SUBCUTANEOUS | Status: DC
  Administered 2013-11-14 – 2013-11-18 (×7): 20 [IU] via SUBCUTANEOUS
  Filled 2013-11-14 (×11): qty 0.2

## 2013-11-14 MED ORDER — ONDANSETRON HCL 4 MG/2ML IJ SOLN
4.0000 mg | Freq: Four times a day (QID) | INTRAMUSCULAR | Status: DC | PRN
  Administered 2013-11-14 (×2): 4 mg via INTRAVENOUS
  Filled 2013-11-14 (×2): qty 2

## 2013-11-14 MED ORDER — PREGABALIN 75 MG PO CAPS
150.0000 mg | ORAL_CAPSULE | Freq: Three times a day (TID) | ORAL | Status: DC
  Administered 2013-11-14 – 2013-11-15 (×5): 150 mg via ORAL
  Filled 2013-11-14: qty 6
  Filled 2013-11-14 (×2): qty 2
  Filled 2013-11-14 (×2): qty 6

## 2013-11-14 MED ORDER — IOHEXOL 300 MG/ML  SOLN
25.0000 mL | INTRAMUSCULAR | Status: AC
  Administered 2013-11-14 (×2): 25 mL via ORAL

## 2013-11-14 MED ORDER — HYDROMORPHONE 0.3 MG/ML IV SOLN
INTRAVENOUS | Status: DC
  Administered 2013-11-14: 13:00:00 via INTRAVENOUS
  Administered 2013-11-14: 1.8 mg via INTRAVENOUS
  Administered 2013-11-14 – 2013-11-15 (×2): 0.9 mg via INTRAVENOUS
  Administered 2013-11-15: 1.2 mL via INTRAVENOUS
  Administered 2013-11-15: 02:00:00 via INTRAVENOUS
  Administered 2013-11-15 (×2): 0.3 mg via INTRAVENOUS
  Administered 2013-11-15: 1.5 mg via INTRAVENOUS
  Filled 2013-11-14 (×2): qty 25

## 2013-11-14 MED ORDER — LISINOPRIL 40 MG PO TABS
40.0000 mg | ORAL_TABLET | Freq: Every day | ORAL | Status: DC
  Filled 2013-11-14: qty 1
  Filled 2013-11-14: qty 2

## 2013-11-14 MED ORDER — METOPROLOL TARTRATE 100 MG PO TABS
200.0000 mg | ORAL_TABLET | Freq: Two times a day (BID) | ORAL | Status: DC
  Administered 2013-11-14 – 2013-11-18 (×8): 200 mg via ORAL
  Filled 2013-11-14 (×13): qty 2

## 2013-11-14 MED ORDER — SODIUM CHLORIDE 0.9 % IJ SOLN: 9.0000 mL | INTRAMUSCULAR | Status: DC | PRN

## 2013-11-14 MED ORDER — METOCLOPRAMIDE HCL 10 MG PO TABS
10.0000 mg | ORAL_TABLET | Freq: Three times a day (TID) | ORAL | Status: DC
  Administered 2013-11-14 – 2013-11-18 (×13): 10 mg via ORAL
  Filled 2013-11-14 (×18): qty 1

## 2013-11-14 MED ORDER — HEPARIN SODIUM (PORCINE) 5000 UNIT/ML IJ SOLN
5000.0000 [IU] | Freq: Three times a day (TID) | INTRAMUSCULAR | Status: DC
  Administered 2013-11-14 – 2013-11-18 (×12): 5000 [IU] via SUBCUTANEOUS
  Filled 2013-11-14 (×18): qty 1

## 2013-11-14 MED ORDER — INSULIN GLARGINE 100 UNIT/ML SOLOSTAR PEN: 20.0000 [IU] | PEN_INJECTOR | Freq: Two times a day (BID) | SUBCUTANEOUS | Status: DC

## 2013-11-14 MED ORDER — MONTELUKAST SODIUM 10 MG PO TABS
10.0000 mg | ORAL_TABLET | Freq: Every day | ORAL | Status: DC
  Administered 2013-11-14 – 2013-11-17 (×4): 10 mg via ORAL
  Filled 2013-11-14 (×8): qty 1

## 2013-11-14 MED ORDER — ONDANSETRON HCL 4 MG PO TABS: 8.0000 mg | ORAL_TABLET | Freq: Three times a day (TID) | ORAL | Status: DC | PRN

## 2013-11-14 MED ORDER — OLOPATADINE HCL 0.1 % OP SOLN
1.0000 [drp] | Freq: Two times a day (BID) | OPHTHALMIC | Status: DC
  Administered 2013-11-14 – 2013-11-18 (×8): 1 [drp] via OPHTHALMIC
  Filled 2013-11-14: qty 5

## 2013-11-14 MED ORDER — DIPHENHYDRAMINE HCL 50 MG/ML IJ SOLN
12.5000 mg | Freq: Four times a day (QID) | INTRAMUSCULAR | Status: DC | PRN
  Filled 2013-11-14: qty 1

## 2013-11-14 MED ORDER — HYDROCHLOROTHIAZIDE 25 MG PO TABS
25.0000 mg | ORAL_TABLET | Freq: Every morning | ORAL | Status: DC
  Filled 2013-11-14 (×2): qty 1

## 2013-11-14 MED ORDER — ALBUTEROL SULFATE (2.5 MG/3ML) 0.083% IN NEBU: 2.5000 mg | INHALATION_SOLUTION | RESPIRATORY_TRACT | Status: DC | PRN

## 2013-11-14 MED ORDER — INSULIN ASPART 100 UNIT/ML ~~LOC~~ SOLN
0.0000 [IU] | Freq: Three times a day (TID) | SUBCUTANEOUS | Status: DC
  Administered 2013-11-14: 11 [IU] via SUBCUTANEOUS
  Administered 2013-11-14 – 2013-11-15 (×2): 2 [IU] via SUBCUTANEOUS
  Administered 2013-11-15: 3 [IU] via SUBCUTANEOUS
  Administered 2013-11-15 – 2013-11-17 (×4): 2 [IU] via SUBCUTANEOUS
  Administered 2013-11-17 – 2013-11-18 (×3): 5 [IU] via SUBCUTANEOUS

## 2013-11-14 MED ORDER — PENTOSAN POLYSULFATE SODIUM 100 MG PO CAPS
100.0000 mg | ORAL_CAPSULE | Freq: Three times a day (TID) | ORAL | Status: DC
Start: 2013-11-14 — End: 2013-11-18
  Administered 2013-11-14 – 2013-11-18 (×11): 100 mg via ORAL
  Filled 2013-11-14 (×17): qty 1

## 2013-11-14 MED ORDER — PREDNISOLONE ACETATE 1 % OP SUSP
1.0000 [drp] | Freq: Two times a day (BID) | OPHTHALMIC | Status: DC
  Administered 2013-11-14 – 2013-11-18 (×8): 1 [drp] via OPHTHALMIC
  Filled 2013-11-14: qty 5
  Filled 2013-11-14: qty 1

## 2013-11-14 MED ORDER — SODIUM CHLORIDE 0.9 % IJ SOLN
3.0000 mL | Freq: Two times a day (BID) | INTRAMUSCULAR | Status: DC
  Administered 2013-11-16: 3 mL via INTRAVENOUS

## 2013-11-14 MED ORDER — DOCUSATE SODIUM 100 MG PO CAPS
100.0000 mg | ORAL_CAPSULE | Freq: Two times a day (BID) | ORAL | Status: DC
  Administered 2013-11-14 – 2013-11-18 (×7): 100 mg via ORAL
  Filled 2013-11-14 (×7): qty 1

## 2013-11-14 MED ORDER — NALOXONE HCL 0.4 MG/ML IJ SOLN: 0.4000 mg | INTRAMUSCULAR | Status: DC | PRN

## 2013-11-14 MED ORDER — LORATADINE 10 MG PO TABS
5.0000 mg | ORAL_TABLET | Freq: Every evening | ORAL | Status: DC
  Administered 2013-11-14 – 2013-11-17 (×4): 5 mg via ORAL
  Filled 2013-11-14: qty 0.5
  Filled 2013-11-14 (×2): qty 1
  Filled 2013-11-14 (×3): qty 0.5

## 2013-11-14 MED ORDER — NORTRIPTYLINE HCL 25 MG PO CAPS
25.0000 mg | ORAL_CAPSULE | Freq: Every day | ORAL | Status: DC
  Administered 2013-11-14 – 2013-11-17 (×4): 25 mg via ORAL
  Filled 2013-11-14 (×6): qty 1

## 2013-11-14 MED ORDER — ONDANSETRON 8 MG/NS 50 ML IVPB
8.0000 mg | Freq: Three times a day (TID) | INTRAVENOUS | Status: DC | PRN
  Filled 2013-11-14: qty 8

## 2013-11-14 MED ORDER — FLUTICASONE PROPIONATE HFA 44 MCG/ACT IN AERO
2.0000 | INHALATION_SPRAY | Freq: Two times a day (BID) | RESPIRATORY_TRACT | Status: DC
  Administered 2013-11-14 – 2013-11-18 (×7): 2 via RESPIRATORY_TRACT
  Filled 2013-11-14 (×2): qty 10.6

## 2013-11-14 MED ORDER — ASPIRIN EC 81 MG PO TBEC
81.0000 mg | DELAYED_RELEASE_TABLET | Freq: Every morning | ORAL | Status: DC
  Administered 2013-11-15 – 2013-11-18 (×4): 81 mg via ORAL
  Filled 2013-11-14 (×6): qty 1

## 2013-11-14 MED ORDER — ALBUTEROL SULFATE HFA 108 (90 BASE) MCG/ACT IN AERS: 2.0000 | INHALATION_SPRAY | RESPIRATORY_TRACT | Status: DC | PRN

## 2013-11-14 NOTE — Progress Notes (Signed)
I have seen and evaluated Ms. Guynes.  Pt states she is having worse pain in her abdomen compared to previously with her known hx of chronic pancreatitis.  States she is having nausea and has had BM or flatus in 3 + days.  Objective: Filed Vitals:   11/14/13 1158  BP: 150/60  Pulse: 97  Temp: 98.6 F (37 C)  Resp: 18    Gen: NAD Abdomen: Soft/ND, NABS, TTP in epigastric region and LLQ w/o rebound or guarding, no HSM palpated   A/P Intractable Epigastric/LLQ pain - F/U CT scan, continue NPO in setting of most likely acute pancreatitis on chronic.    Dispo : Pending further evaluation  Suzanna Zahn R. Awanda Mink, DO of [facility] 11/14/2013, 2:04 PM

## 2013-11-14 NOTE — Progress Notes (Signed)
Patient ID: [name] [name], female   DOB: June 26, 1950, 63 y.o.   MRN: [id number]  Kenn File, MD Phone: [phone]  Subjective:  Chief complaint-noted  Pt Here for followup pancreatitis/abdominal pain  Patient states that since she left Friday she's vomited 3 times and has been drinking only water and broth. She did try potatoes once which caused more abdominal pain.  She's been taking 4 mg of Dilaudid by mouth which improves the pain slightly for only a short time and it returns worse than it started.  She states that she's checked her blood sugar one time since she left it was 150, she still taking all of her insulin  He states that her pain is a 20 out of 10 today and that the medications or not taking care of her pain adequately. She would like to be admitted as we have discussed on several occasions in the last week.  Her brief acute problem list as below Acute pancreatitis Attention GERD Fibromyalgia Diabetes mellitus 2 Depression Chronic pain Anemia  ROS-  Positive fever, chills No dyspnea Chest pain Positive abdominal pain described as epigastric radiating to the back down the left flank and over to her suprapubic area.    Past Medical History Patient Active Problem List   Diagnosis Date Noted  . Otitis externa 10/11/2013  . Acute sinusitis 08/12/2013  . Candidal intertrigo 05/11/2013  . Dyshidrotic hand dermatitis 05/11/2013  . Fibromyalgia 02/14/2013  . Diabetic peripheral neuropathy 02/14/2013  . Ingrown nail 01/04/2013  . H/O ventral hernia repair 08/13/2012  . Benign neoplasm of colon 12/31/2011  . Epigastric abdominal pain 12/29/2011  . Cough 12/17/2011  . CAD (coronary artery disease) 11/25/2011  . Annual physical exam 11/04/2011  . Chest pain 10/31/2011  . Neuropathy 03/01/2011  . Tinea pedis 02/17/2011  . Palpitations 01/24/2011  . Umbilical hernia 79/39/0300  . Diabetic gastroparesis associated with type 2 diabetes mellitus 11/07/2010    . Pancreatitis, recurrent 11/07/2010  . Anemia 09/25/2010  . Allergic rhinitis 06/28/2010  . CONSTIPATION 02/26/2010  . DM (diabetes mellitus), type 2, uncontrolled 09/10/2009  . DEPRESSION, MAJOR, WITH PSYCHOTIC BEHAVIOR 08/14/2009  . Chronic pain syndrome 08/14/2009  . CATARACT EXTRACTIONS, BILATERAL, HX OF 08/14/2009  . Tobacco abuse counseling 12/30/2008  . GASTROESOPHAGEAL REFLUX DISEASE, CHRONIC 02/11/2008  . HYPERTENSION, BENIGN ESSENTIAL 10/21/2006  . URINARY INCONTINENCE 10/21/2006    Medications- reviewed and updated Current Outpatient Prescriptions  Medication Sig Dispense Refill  . albuterol (PROVENTIL HFA;VENTOLIN HFA) 108 (90 BASE) MCG/ACT inhaler Inhale 2 puffs into the lungs every 4 (four) hours as needed. For shortness of breath  8.5 g  1  . albuterol (PROVENTIL) (2.5 MG/3ML) 0.083% nebulizer solution Take 3 mLs (2.5 mg total) by nebulization every 6 (six) hours as needed for wheezing.  150 mL  1  . ALREX 0.2 % SUSP 1-2 drops as needed (itching).       Marland Kitchen aspirin EC 81 MG tablet Take 81 mg by mouth every morning.       . Aspirin-Acetaminophen-Caffeine (EXCEDRIN MIGRAINE PO) Take 2 capsules by mouth 3 (three) times daily as needed. For migraine      . atorvastatin (LIPITOR) 20 MG tablet Take 20 mg by mouth every morning.      . B-D ULTRAFINE III SHORT PEN 31G X 8 MM MISC USE 5 TIMES A DAY, WITH LANTUS AND NOVOLOG  100 each  PRN  . beclomethasone (QVAR) 80 MCG/ACT inhaler Inhale 2 puffs into the lungs 2 (two) times daily.  1 Inhaler  12  . Blood Glucose Monitoring Suppl (ACCU-CHEK COMPACT CARE KIT) KIT 1 strip by Does not apply route 4 (four) times daily.  120 each  11  . ciprofloxacin-dexamethasone (CIPRODEX) otic suspension Place 4 drops into the right ear 2 (two) times daily. For 10 days.  7.5 mL  0  . clobetasol ointment (TEMOVATE) 2.95 % Apply 1 application topically at bedtime. To palms  30 g  0  . Difluprednate (DUREZOL) 0.05 % EMUL Place 2 drops into the right eye  2 (two) times daily.  5 mL  6  . diphenhydrAMINE (BENADRYL) 25 mg capsule Take 25 mg by mouth every 6 (six) hours as needed. For itching      . docusate sodium (COLACE) 100 MG capsule Take 100 mg by mouth 2 (two) times daily.       . DULoxetine (CYMBALTA) 20 MG capsule Take 1 capsule (20 mg total) by mouth daily.  30 capsule  3  . erythromycin (E-MYCIN) 250 MG tablet Take 1 tablet (250 mg total) by mouth 3 (three) times daily before meals.  90 tablet  3  . esomeprazole (NEXIUM) 40 MG capsule Take 1 capsule (40 mg total) by mouth daily before breakfast.  90 capsule  3  . furosemide (LASIX) 20 MG tablet Take 1 tablet (20 mg total) by mouth daily as needed for fluid or edema.  30 tablet  3  . glucose blood (ACCU-CHEK COMPACT PLUS) test strip Use to test four times a day. Diagnosis code: 250.02  100 each  12  . hydrochlorothiazide (HYDRODIURIL) 25 MG tablet Take 1 tablet (25 mg total) by mouth every morning.  90 tablet  3  . HYDROcodone-acetaminophen (NORCO/VICODIN) 5-325 MG per tablet Take 1-2 tablets by mouth every 6 (six) hours as needed for moderate pain.  20 tablet  0  . hydrocortisone cream 0.5 % Apply 1 application topically 2 (two) times daily. ONLY USE FOR 1 WEEK  30 g  0  . HYDROmorphone (DILAUDID) 4 MG tablet Take 1 tablet (4 mg total) by mouth every 6 (six) hours as needed for severe pain.  15 tablet  0  . Hypromellose (ARTIFICIAL TEARS OP) Place 1 drop into both eyes daily as needed. For dry eyes      . insulin aspart (NOVOLOG FLEXPEN) 100 UNIT/ML FlexPen Inject 15 Units into the skin 3 (three) times daily with meals. QS for 1 month supply  2 pen  0  . Insulin Glargine (LANTUS) 100 UNIT/ML Solostar Pen Inject 36 Units into the skin 2 (two) times daily.  3 mL  0  . Lancets (ONETOUCH ULTRASOFT) lancets Use 4 times daily to check cbg  100 each  12  . levocetirizine (XYZAL) 5 MG tablet Take 1 tablet (5 mg total) by mouth every evening.  30 tablet  3  . lidocaine-prilocaine (EMLA) cream Apply 1  application topically as needed (TID).      Marland Kitchen lisinopril (PRINIVIL,ZESTRIL) 40 MG tablet Take 1 tablet (40 mg total) by mouth daily.  30 tablet  6  . meloxicam (MOBIC) 7.5 MG tablet Take 1 tablet (7.5 mg total) by mouth daily.  30 tablet  3  . metoCLOPramide (REGLAN) 10 MG tablet Take 1 tablet (10 mg total) by mouth 3 (three) times daily before meals.  90 tablet  3  . metoprolol (LOPRESSOR) 100 MG tablet Take 2 tablets (200 mg total) by mouth 2 (two) times daily.  120 tablet  6  . montelukast (SINGULAIR) 10  MG tablet take 1 tablet by mouth at bedtime  90 tablet  1  . mupirocin ointment (BACTROBAN) 2 % Apply topically 3 (three) times daily. For 2 weeks.  22 g  0  . naproxen sodium (ANAPROX) 220 MG tablet Take 440 mg by mouth 3 (three) times daily with meals as needed.      . nicotine polacrilex (NICORETTE) 4 MG gum Take 4 mg by mouth as needed for smoking cessation (cravings). Take up to 10 pieces per day      . nortriptyline (PAMELOR) 25 MG capsule Take 1 capsule (25 mg total) by mouth at bedtime.  30 capsule  3  . nystatin (MYCOSTATIN) 100000 UNIT/ML suspension Take 5 mLs (500,000 Units total) by mouth 2 (two) times daily. Swish and swallow  60 mL  1  . nystatin (MYCOSTATIN) powder Apply topically 3 (three) times daily. To affected area  30 g  3  . olopatadine (PATANOL) 0.1 % ophthalmic solution Place 1 drop into both eyes 2 (two) times daily.  5 mL  12  . ondansetron (ZOFRAN) 4 MG tablet Take 1 tablet (4 mg total) by mouth every 8 (eight) hours as needed for nausea or vomiting. For nausea  30 tablet  3  . oxyCODONE-acetaminophen (ROXICET) 5-325 MG per tablet Take 1 tablet by mouth every 8 (eight) hours as needed for severe pain.  20 tablet  0  . pentosan polysulfate (ELMIRON) 100 MG capsule Take 1 capsule (100 mg total) by mouth 3 (three) times daily before meals.  270 capsule  1  . phenazopyridine (PYRIDIUM) 200 MG tablet Take 200 mg by mouth 3 (three) times daily as needed (bladder pain).         . polyethylene glycol (GLYCOLAX) packet Take 17 g by mouth every 3 (three) days. Mix in 4-6 oz of water      . prednisoLONE acetate (PRED FORTE) 1 % ophthalmic suspension Place 1 drop into both eyes 2 (two) times daily.  5 mL  6  . pregabalin (LYRICA) 150 MG capsule Take 1 capsule (150 mg total) by mouth 3 (three) times daily.  90 capsule  3  . promethazine (PHENERGAN) 25 MG tablet Take 1 tablet (25 mg total) by mouth every 8 (eight) hours as needed for nausea or vomiting.  20 tablet  0  . QUEtiapine (SEROQUEL) 200 MG tablet Take 0.5 tablets (100 mg total) by mouth at bedtime.  30 tablet  6  . ranitidine (ZANTAC) 150 MG tablet Take 150 mg by mouth daily as needed. For acid reflux       No current facility-administered medications for this visit.    Objective: BP 138/84  Pulse 63  Temp(Src) 98.4 F (36.9 C) (Oral)  Resp 18  Wt 176 lb (79.833 kg)  SpO2 96% Gen: NAD, alert, cooperative with exam HEENT: NCAT, EOMI, sclera white CV: RRR, good S1/S2, no murmur Resp: CTABL, no wheezes, non-labored Abd: Soft but diffuse tenderness to palpation, no guarding, most pain with palpation is in the epigastric area Ext: Trace to 1+ bilateral lower Cervone edema, warm, 1+ DP pulse Neuro: Alert and oriented, No gross deficits   Assessment/Plan:  No problem-specific assessment & plan notes found for this encounter.   No orders of the defined types were placed in this encounter.    No orders of the defined types were placed in this encounter.
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
## 2013-11-14 NOTE — Assessment & Plan Note (Signed)
Acute pancreatitis, lipase elevated on labs performed last Thursday Oral medications at home are not controlling her pain We will admit her for IV pain medications and IV fluids
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
## 2013-11-15 LAB — LIPID PANEL
Cholesterol: 155 mg/dL (ref 0–200)
HDL: 27 mg/dL — ABNORMAL LOW (ref >39)
LDL Cholesterol: 85 mg/dL (ref 0–99)
Total CHOL/HDL Ratio: 5.7 RATIO
Triglycerides: 217 mg/dL — ABNORMAL HIGH (ref <150)
VLDL: 43 mg/dL — ABNORMAL HIGH (ref 0–40)

## 2013-11-15 LAB — BASIC METABOLIC PANEL
Anion gap: 15 (ref 5–15)
BUN: 9 mg/dL (ref 6–23)
CO2: 21 mEq/L (ref 19–32)
Calcium: 8 mg/dL — ABNORMAL LOW (ref 8.4–10.5)
Chloride: 100 mEq/L (ref 96–112)
Creatinine, Ser: 1.31 mg/dL — ABNORMAL HIGH (ref 0.50–1.10)
GFR calc Af Amer: 48 mL/min — ABNORMAL LOW (ref >90)
GFR calc non Af Amer: 42 mL/min — ABNORMAL LOW (ref >90)
Glucose, Bld: 158 mg/dL — ABNORMAL HIGH (ref 70–99)
Potassium: 3.9 mEq/L (ref 3.7–5.3)
Sodium: 136 mEq/L — ABNORMAL LOW (ref 137–147)

## 2013-11-15 LAB — GLUCOSE, CAPILLARY
Glucose-Capillary: 179 mg/dL — ABNORMAL HIGH (ref 70–99)
Glucose-Capillary: 146 mg/dL — ABNORMAL HIGH (ref 70–99)
Glucose-Capillary: 131 mg/dL — ABNORMAL HIGH (ref 70–99)
Glucose-Capillary: 90 mg/dL (ref 70–99)

## 2013-11-15 LAB — URINALYSIS W MICROSCOPIC (NOT AT ARMC)
Bilirubin Urine: NEGATIVE
Glucose, UA: 100 mg/dL — AB
Ketones, ur: NEGATIVE mg/dL
Leukocytes, UA: NEGATIVE
Nitrite: NEGATIVE
Protein, ur: NEGATIVE mg/dL
Specific Gravity, Urine: 1.027 (ref 1.005–1.030)
Urobilinogen, UA: 0.2 mg/dL (ref 0.0–1.0)
pH: 5.5 (ref 5.0–8.0)

## 2013-11-15 LAB — CBC
HCT: 36.9 % (ref 36.0–46.0)
Hemoglobin: 11.3 g/dL — ABNORMAL LOW (ref 12.0–15.0)
MCH: 27 pg (ref 26.0–34.0)
MCHC: 30.6 g/dL (ref 30.0–36.0)
MCV: 88.3 fL (ref 78.0–100.0)
Platelets: 125 10*3/uL — ABNORMAL LOW (ref 150–400)
RBC: 4.18 MIL/uL (ref 3.87–5.11)
RDW: 14.6 % (ref 11.5–15.5)
WBC: 5.1 10*3/uL (ref 4.0–10.5)

## 2013-11-15 MED ORDER — ATORVASTATIN CALCIUM 80 MG PO TABS
80.0000 mg | ORAL_TABLET | Freq: Every day | ORAL | Status: DC
  Administered 2013-11-15 – 2013-11-17 (×3): 80 mg via ORAL
  Filled 2013-11-15 (×4): qty 1

## 2013-11-15 NOTE — Progress Notes (Signed)
I have seen and examined this patient. I have discussed with Dr Bacigalupo.  I agree with their findings and plans as documented in their progress note.    

## 2013-11-15 NOTE — Progress Notes (Signed)
Family Medicine Teaching Service Daily Progress Note Intern Pager: [phone]  Patient name: [name] [name] Medical record number: [id number] Date of birth: [birth year] Age: 63 y.o. Gender: female  Primary Care Provider: Kenn File, MD Consultants: none Code Status: Full  Pt Overview and Major Events to Date:  7/27 Admit for acute on chronic pancreatitis  Assessment and Plan:  [name] [name] is a 63 y.o. female presenting with epigastric abdominal pain and vomiting, consistent with flare of chronic pancreatitis. PMH is significant for prior cholecystectomy, CAD, GERD, insulin dependent T2DM, HTN, fibromyalgia, recurrent pancreatitis.   # Acute on chronic pancreatitis: Lipase of 233 on Friday>134 on admission, which is above pt's baseline normal level. CT abd/pelvis shows pancreatitis. Triglycerides not elevated enough to cause pancreatitis.  Patient denies alcohol use. - dilaudid PCA for pain control. Will d/c later today if pt is advancing diet.  Will calculate home narcotic requirement as well. - currently NPO, ADAT - zofran prn nausea/vomiting  - continue home colace (especially given high narcotic dosing)   # Chest pain/SOB: Resolved. CXR with chronic bronchitis. EKG unchanged from previous. Troponin neg x2. BNP wnl. - Encourage IS - albuterol prn  - continue aspirin 81mg  daily   # AKI: Baseline Cr 0.8, increased to 1.31 from yesterday to today. - Repeat UA pending - IV NS @ 149mL/hr as patient is NPO  # HTN: normotensive currently  - continue metoprolol - holding HCTZ and Lisinopril in setting of AKI   # HLD: ASCVD 108yr risk 27.9% based on lipid panel from admission. -Increase lipitor dose to 80mg  daily, as risk warrants high intensity statin   # Diabetes Type 2: Uncontrolled, insulin dependent, last A1c April 2015 9.7 > 10.8 on admission. Concern for pancreatic failure given chronic pancreatitis. CBGs well controlled O/N (125-179) - Continue lantus 20 units BID (home  dose 36 units BID)  and moderate SSI  # Ophtho: unclear what her ophthalmologic hx is. Has had bilateral cataract surgery in the past. She is on several steroid eye drops - continue home eye drops   # GERD: EGD in Sept 2013 was normal. Unclear if she has an actual diagnosis of gastroparesis (but is on reglan and erythromycin).  - continue PPI, reglan, erythromycin   # Psych: hx of depression/anxiety  - continue seroquel, nortriptyline  - hold home duloxetine for now as it is listed as an allergy. Will need inpatient team/pharmacy to clarify   # Tobacco abuse: smokes approx 6 cigarettes today. Would like nicotine patch.  -low dose nicotine patch while hospitalized   # Polypharmacy: marked polypharm, with 51 items listed on pre-admission med list  -ideally would remove some meds as able, will continue all chronic meds for now  -involve Dr. Valentina Lucks & pharmacy team while inpatient   FEN/GI: NPO, plan to advance to clear liquids today, hydrate IVF NS @ 150cc/hr  Prophylaxis: SQ heparin   Disposition: admit to telemetry, dispo pending clinical evaluation and improvement, ability to tolerate PO   Subjective:  Patient is crying about vomiting and abd pain this AM.  She reports CP and SOB have improved from admission.  She is not ready to tolerate any PO.  Objective: Temp:  [98.1 F (36.7 C)-98.6 F (37 C)] 98.1 F (36.7 C) (07/28 0629) Pulse Rate:  [63-97] 80 (07/28 0629) Resp:  [12-18] 13 (07/28 0846) BP: (106-157)/(60-95) 118/84 mmHg (07/28 0629) SpO2:  [95 %-100 %] 96 % (07/28 0846) Weight:  [176 lb (79.833 kg)-242 lb 11.2 oz (110.088 kg)] 242 lb  11.2 oz (110.088 kg) (07/28 5520) Physical Exam: General: NAD, sitting in chair, crying, appears in pain   Cardiovascular: RRR, no murmurs, heart sounds distant  Respiratory: CTAB, NWOB  Abdomen: soft, no masses or organomegaly although obesity limits exam. TTP epigastric area as well as LLQ.  Extremities: 2+ DP pulses bilat, no calf  tenderness bilat  Skin: chronic dermatitis-type changes of lower extremities  Neuro: A&O x3   Laboratory:  Recent Labs Lab 11/10/13 1205 11/14/13 1319 11/15/13 0402  WBC 6.2 6.7 5.1  HGB 13.0 12.7 11.3*  HCT 39.9 39.4 36.9  PLT 143* 145* 125*    Recent Labs Lab 11/10/13 1205 11/14/13 1319 11/15/13 0402  NA 139 138 136*  K 3.8 4.2 3.9  CL 103 98 100  CO2 23 27 21   BUN 8 8 9   CREATININE 0.80 0.76 1.31*  CALCIUM 8.9 8.5 8.0*  PROT 7.4 7.3  --   BILITOT 0.4 0.3  --   ALKPHOS 86 106  --   ALT 22 26  --   AST 23 19  --   GLUCOSE 170* 271* 158*    Troponins neg x2  Urinalysis    Component Value Date/Time   COLORURINE YELLOW 11/14/2013 1757   APPEARANCEUR CLEAR 11/14/2013 1757   LABSPEC 1.010 11/14/2013 1757   PHURINE 5.5 11/14/2013 1757   GLUCOSEU 500* 11/14/2013 1757   HGBUR NEGATIVE 11/14/2013 1757   HGBUR negative 02/06/2010 1429   BILIRUBINUR NEGATIVE 11/14/2013 1757   BILIRUBINUR NEG 09/25/2010 0925   KETONESUR NEGATIVE 11/14/2013 1757   PROTEINUR NEGATIVE 11/14/2013 1757   PROTEINUR NEG 09/25/2010 0925   UROBILINOGEN 0.2 11/14/2013 1757   UROBILINOGEN 0.2 09/25/2010 0925   NITRITE NEGATIVE 11/14/2013 1757   NITRITE NEG 09/25/2010 0925   LEUKOCYTESUR NEGATIVE 11/14/2013 1757    BNP (last 3 results)  Recent Labs  11/14/13 1319  PROBNP 44.1    Lipid Panel     Component Value Date/Time   CHOL 155 11/15/2013 0402   TRIG 217* 11/15/2013 0402   HDL 27* 11/15/2013 0402   CHOLHDL 5.7 11/15/2013 0402   VLDL 43* 11/15/2013 0402   LDLCALC 85 11/15/2013 0402    Hgb A1c: 10.8  Lipase     Component Value Date/Time   LIPASE 134* 11/14/2013 1319      Imaging/Diagnostic Tests: CXR (7/27): Bilateral peribronchial thickening and bilateral hazy pulmonary  opacities are new compared to prior chest radiograph of 01/19/2013.  Pulmonary vascularity appears slightly prominent. Question if  pulmonary findings could reflect mild/early pulmonary edema. Other  considerations  include viral infection/bronchitis. Suggest clinical  Correlation.  CT abd/pelvis (7/27): 1. Haziness about the pancreatic body is indicative early/mild acute  pancreatitis.  2. Hepatic steatosis.  3. Midline ventral hernia contains fat.    Lavon Paganini, MD 11/15/2013, 8:59 AM PGY-1, [place] Intern pager: [phone], text pages welcome

## 2013-11-16 ENCOUNTER — Telehealth: Payer: Self-pay | Admitting: Family Medicine

## 2013-11-16 LAB — BASIC METABOLIC PANEL
Anion gap: 14 (ref 5–15)
BUN: 7 mg/dL (ref 6–23)
CO2: 24 mEq/L (ref 19–32)
Calcium: 7.6 mg/dL — ABNORMAL LOW (ref 8.4–10.5)
Chloride: 106 mEq/L (ref 96–112)
Creatinine, Ser: 0.81 mg/dL (ref 0.50–1.10)
GFR calc Af Amer: 86 mL/min — ABNORMAL LOW (ref >90)
GFR calc non Af Amer: 75 mL/min — ABNORMAL LOW (ref >90)
Glucose, Bld: 121 mg/dL — ABNORMAL HIGH (ref 70–99)
Potassium: 3.9 mEq/L (ref 3.7–5.3)
Sodium: 144 mEq/L (ref 137–147)

## 2013-11-16 LAB — GLUCOSE, CAPILLARY
Glucose-Capillary: 129 mg/dL — ABNORMAL HIGH (ref 70–99)
Glucose-Capillary: 125 mg/dL — ABNORMAL HIGH (ref 70–99)
Glucose-Capillary: 124 mg/dL — ABNORMAL HIGH (ref 70–99)

## 2013-11-16 MED ORDER — PREGABALIN 50 MG PO CAPS
150.0000 mg | ORAL_CAPSULE | Freq: Three times a day (TID) | ORAL | Status: DC
  Administered 2013-11-16 – 2013-11-18 (×6): 150 mg via ORAL
  Filled 2013-11-16 (×6): qty 3

## 2013-11-16 MED ORDER — PREGABALIN 50 MG PO CAPS: 150.0000 mg | ORAL_CAPSULE | Freq: Once | ORAL | Status: DC

## 2013-11-16 MED ORDER — OXYCODONE HCL 5 MG PO TABS
5.0000 mg | ORAL_TABLET | ORAL | Status: DC | PRN
  Administered 2013-11-16 – 2013-11-18 (×6): 5 mg via ORAL
  Filled 2013-11-16 (×6): qty 1

## 2013-11-16 NOTE — Telephone Encounter (Signed)
[name] [name] who is [name] [name]'s son called and wanted to know what the status of his mother is. He doesn't know if the doctor has been by the hospital. He would just like a update on her condition. Please call him at [phone]. [name] [name]

## 2013-11-16 NOTE — Progress Notes (Signed)
Family Medicine Teaching Service Daily Progress Note Intern Pager: [phone]  Patient name: [name] [name] Medical record number: [id number] Date of birth: March 07, 1951 Age: 63 y.o. Gender: female  Primary Care Provider: Kenn File, MD Consultants: none Code Status: Full  Pt Overview and Major Events to Date:  7/27: Admit for acute on chronic pancreatitis 7/29: PCA DC'd; PO pain management started; Diet to advance as tolerated  Assessment and Plan:  [name] [name] is a 63 y.o. female presenting with epigastric abdominal pain and vomiting, consistent with flare of chronic pancreatitis. PMH is significant for prior cholecystectomy, CAD, GERD, insulin dependent T2DM, HTN, fibromyalgia, recurrent pancreatitis.   # Acute on chronic pancreatitis: Lipase of 233 on Friday>134 on admission, which is above pt's baseline normal level. CT abd/pelvis shows pancreatitis. Triglycerides not elevated enough to cause pancreatitis.  Patient denies alcohol use. - DC'd dilaudid PCA; Started PO Oxycodone 5mg  Q4H PRN for pain control.  - currently NPO, ADAT - zofran prn nausea/vomiting  - continue home colace (especially given high narcotic dosing)  - No longer NPO; Full Liquid Diet>>advance as tolerated  # Chest pain/SOB: Resolved. CXR with chronic bronchitis. EKG unchanged from previous. Troponin neg x2. BNP wnl. - Encourage IS - albuterol prn  - continue aspirin 81mg  daily   # AKI: Baseline Cr 0.8, increased to 1.31>>BMP currently pending - Repeat UA pending - IV NS @ 196mL/hr until PO intake is adequate.  # HTN: normotensive currently  - continue metoprolol - holding HCTZ and Lisinopril in setting of AKI   # HLD: ASCVD 1yr risk 27.9% based on lipid panel from admission. -Increase lipitor dose to 80mg  daily, as risk warrants high intensity statin   # Diabetes Type 2: Uncontrolled, insulin dependent, last A1c April 2015 9.7 > 10.8 on admission. Concern for pancreatic failure given chronic  pancreatitis. CBGs well controlled O/N (125-179) - Continue lantus 20 units BID (home dose 36 units BID)  and moderate SSI  # Ophtho: unclear what her ophthalmologic hx is. Has had bilateral cataract surgery in the past. She is on several steroid eye drops - continue home eye drops   # GERD: EGD in Sept 2013 was normal. Unclear if she has an actual diagnosis of gastroparesis (but is on reglan and erythromycin).  - continue PPI, reglan, erythromycin   # Psych: hx of depression/anxiety  - continue seroquel, nortriptyline  - hold home duloxetine for now as it is listed as an allergy. Will need inpatient team/pharmacy to clarify   # Tobacco abuse: smokes approx 6 cigarettes today. Would like nicotine patch.  -low dose nicotine patch while hospitalized   # Polypharmacy: marked polypharm, with 51 items listed on pre-admission med list  -ideally would remove some meds as able, will continue all chronic meds for now  -involve Dr. Valentina Lucks & pharmacy team while inpatient   FEN/GI: NPO, plan to advance to clear liquids today, hydrate IVF NS @ 150cc/hr  Prophylaxis: SQ heparin   Disposition: admit to telemetry, dispo pending clinical evaluation and improvement, ability to tolerate PO   Subjective:  Patient is laying in bed in NAD. Still reported some pain but significantly improved since yesterday. She states that she has has some urinary dribbling overnight. Abdominal pain is not localized. CP and SOB have improved from admission. We are transitioning her to PO pain management and full liquid diet with orders to advance as tolerated.  Objective: Temp:  [97.8 F (36.6 C)-98.4 F (36.9 C)] 98.3 F (36.8 C) (07/29 0917) Pulse Rate:  [  74-82] 79 (07/29 0917) Resp:  [12-19] 15 (07/29 0917) BP: (121-154)/(57-77) 138/68 mmHg (07/29 0917) SpO2:  [95 %-100 %] 99 % (07/29 0917) Weight:  [244 lb 4.8 oz (110.814 kg)] 244 lb 4.8 oz (110.814 kg) (07/29 0500) Physical Exam: General: NAD, sitting in  bed. Cardiovascular: RRR, no murmurs, heart sounds distant  Respiratory: CTAB, NWOB  Abdomen: soft, no masses or organomegaly although obesity limits exam. Significant abdominal surgical scars noted, primarily at the LLQ. TTP epigastric area as well as LLQ.  Extremities: 2+ DP pulses bilat, no calf tenderness bilat  Skin: chronic dermatitis-type changes of lower extremities  Neuro: A&O x3   Laboratory:  Recent Labs Lab 11/10/13 1205 11/14/13 1319 11/15/13 0402  WBC 6.2 6.7 5.1  HGB 13.0 12.7 11.3*  HCT 39.9 39.4 36.9  PLT 143* 145* 125*    Recent Labs Lab 11/10/13 1205 11/14/13 1319 11/15/13 0402  NA 139 138 136*  K 3.8 4.2 3.9  CL 103 98 100  CO2 23 27 21   BUN 8 8 9   CREATININE 0.80 0.76 1.31*  CALCIUM 8.9 8.5 8.0*  PROT 7.4 7.3  --   BILITOT 0.4 0.3  --   ALKPHOS 86 106  --   ALT 22 26  --   AST 23 19  --   GLUCOSE 170* 271* 158*    Troponins neg x2  Urinalysis    Component Value Date/Time   COLORURINE YELLOW 11/15/2013 2035   APPEARANCEUR CLEAR 11/15/2013 2035   LABSPEC 1.027 11/15/2013 2035   PHURINE 5.5 11/15/2013 2035   GLUCOSEU 100* 11/15/2013 2035   HGBUR SMALL* 11/15/2013 2035   HGBUR negative 02/06/2010 1429   BILIRUBINUR NEGATIVE 11/15/2013 2035   BILIRUBINUR NEG 09/25/2010 0925   KETONESUR NEGATIVE 11/15/2013 2035   PROTEINUR NEGATIVE 11/15/2013 2035   PROTEINUR NEG 09/25/2010 0925   UROBILINOGEN 0.2 11/15/2013 2035   UROBILINOGEN 0.2 09/25/2010 0925   NITRITE NEGATIVE 11/15/2013 2035   NITRITE NEG 09/25/2010 0925   LEUKOCYTESUR NEGATIVE 11/15/2013 2035    BNP (last 3 results)  Recent Labs  11/14/13 1319  PROBNP 44.1    Lipid Panel     Component Value Date/Time   CHOL 155 11/15/2013 0402   TRIG 217* 11/15/2013 0402   HDL 27* 11/15/2013 0402   CHOLHDL 5.7 11/15/2013 0402   VLDL 43* 11/15/2013 0402   LDLCALC 85 11/15/2013 0402    Hgb A1c: 10.8  Lipase     Component Value Date/Time   LIPASE 134* 11/14/2013 1319      Imaging/Diagnostic  Tests: CXR (7/27): Bilateral peribronchial thickening and bilateral hazy pulmonary  opacities are new compared to prior chest radiograph of 01/19/2013.  Pulmonary vascularity appears slightly prominent. Question if  pulmonary findings could reflect mild/early pulmonary edema. Other  considerations include viral infection/bronchitis. Suggest clinical  Correlation.  CT abd/pelvis (7/27): 1. Haziness about the pancreatic body is indicative early/mild acute  pancreatitis.  2. Hepatic steatosis.  3. Midline ventral hernia contains fat.    Elberta Leatherwood, MD 11/16/2013, 10:57 AM PGY-1, [facility] Intern pager: [phone], text pages welcome

## 2013-11-16 NOTE — Progress Notes (Signed)
I have seen and examined this patient. I have discussed with Dr Alease Frame.  I agree with their findings and plans as documented in their progress note.  Acute Pancreatitis - Improving - Plan to challenge with oral diet and titrate to off fluids. - Trial of change to oral oxycodone as needed equivalent to PCA Morphine Equivalent Daily Dose.

## 2013-11-17 LAB — BASIC METABOLIC PANEL
Anion gap: 12 (ref 5–15)
BUN: 4 mg/dL — ABNORMAL LOW (ref 6–23)
CO2: 24 mEq/L (ref 19–32)
Calcium: 7.6 mg/dL — ABNORMAL LOW (ref 8.4–10.5)
Chloride: 107 mEq/L (ref 96–112)
Creatinine, Ser: 0.75 mg/dL (ref 0.50–1.10)
GFR calc Af Amer: >90 mL/min (ref >90)
GFR calc non Af Amer: 87 mL/min — ABNORMAL LOW (ref >90)
Glucose, Bld: 139 mg/dL — ABNORMAL HIGH (ref 70–99)
Potassium: 3.3 mEq/L — ABNORMAL LOW (ref 3.7–5.3)
Sodium: 143 mEq/L (ref 137–147)

## 2013-11-17 LAB — GLUCOSE, CAPILLARY
Glucose-Capillary: 175 mg/dL — ABNORMAL HIGH (ref 70–99)
Glucose-Capillary: 190 mg/dL — ABNORMAL HIGH (ref 70–99)
Glucose-Capillary: 207 mg/dL — ABNORMAL HIGH (ref 70–99)
Glucose-Capillary: 221 mg/dL — ABNORMAL HIGH (ref 70–99)
Glucose-Capillary: 199 mg/dL — ABNORMAL HIGH (ref 70–99)

## 2013-11-17 LAB — MAGNESIUM: Magnesium: 1.6 mg/dL (ref 1.5–2.5)

## 2013-11-17 MED ORDER — POTASSIUM CHLORIDE CRYS ER 20 MEQ PO TBCR
40.0000 meq | EXTENDED_RELEASE_TABLET | Freq: Once | ORAL | Status: AC
  Administered 2013-11-17: 40 meq via ORAL
  Filled 2013-11-17: qty 2

## 2013-11-17 MED ORDER — MAGNESIUM SULFATE 40 MG/ML IJ SOLN
2.0000 g | Freq: Once | INTRAMUSCULAR | Status: AC
  Administered 2013-11-17: 2 g via INTRAVENOUS
  Filled 2013-11-17: qty 50

## 2013-11-17 NOTE — Discharge Instructions (Signed)
You were admitted to the hospital for acute on chronic pancreatitis. Mild to moderate continued abdominal pain is anticipated. We have prescribed a limited amount of pain medication for this anticipated pain. This medication should be taken as follows:  - Pain medication: You have been prescribed 20 tablets of oxycodone for discharge. You are advised to take this medication when necessary 4 times a day for the first 2 days; when necessary 3 times a day for the following 2 days; when necessary 2 times a day for the next 2 days; when necessary one time a day for the final 2 days.

## 2013-11-17 NOTE — Discharge Summary (Signed)
[facility] Discharge Summary  Patient name: [name] [name] Medical record number: [id number] Date of birth: 31-Jul-1950 Age: 63 y.o. Gender: female Date of Admission: 11/14/2013  Date of Discharge: 11/18/2013  Admitting Physician: Blane Ohara McDiarmid, MD  Primary Care Provider: Kenn File, MD Consultants: None  Indication for Hospitalization: Acute on Chronic Pancreatitis  Discharge Diagnoses/Problem List:  Acute on Chronic Pancreatitis AKI HTN HLD DM Type 2 GERD Tobacco  Disposition: Home  Discharge Condition: Stable  Discharge Exam:  General: NAD, sitting in bed.  Cardiovascular: RRR, no murmurs, heart sounds distant  Respiratory: CTAB, NWOB  Abdomen: soft, no masses or organomegaly although obesity limits exam. Significant abdominal surgical scars noted, primarily at the LLQ. Abdominal tenderness significantly improved  Extremities: 2+ DP pulses bilat, no calf tenderness bilat  Skin: chronic dermatitis-type changes of lower extremities  Neuro: A&O x3   Brief Hospital Course:  Patient presented with abdominal pain. She's been seen in the clinic for this pain. She was started on by mouth Dilaudid 2 mg on 7/21. She was seen again for followup on 7/23 which she received an increase to 4 mg of Dilaudid and had her labs checked which were significant for >200 lipase. Pain was described as constant epigastric radiating down the lower left quadrant. He also expands to the rest of her abdomen and to her back. She experienced some chills and 3 episodes of vomiting. Her appetite was significantly diminished however she was taking in some fluids. Pt has known CAD.  Patient received a chest x-ray which showed mild to moderate peribronchial thickening. Abdominal CT haziness of the pancreas consistent with early pancreatitis. As well as hepatic steatosis. Patient was admitted to inpatient for acute on chronic pancreatitis. Do to her increased lipase as well  as CT and physical exam findings. Patient was placed on a Dilaudid PCA for pain control. She was given Zofran when necessary for nausea and vomiting. UA negative (except glucose: 500). EKG was unremarkable and troponins were negative x2.  Patient tolerated inpatient care well. She was NPO while on the Dilaudid PCA. PCA was discontinued on 7/29 and her diet was started on full liquids, with orders to advance as tolerated. Patient's pain management was changed to oral oxycodone. Patient tolerated diet and oral medication well. She did have some episodes of diarrhea on the night of 7/29. However she had tested negative for C. difficile on PCR and these episodes improved over the following day.  Patient is tolerated a full diet now for over 12 hours. She is no longer complaining of significant abdominal pain or nausea.  Patient has been deemed medically stable and ready for discharge. A followup appointment at patient's [facility] care provider's office has been scheduled.    Issues for Follow Up:  - Significant polypharmacy. Should discuss with primary care physician - Acute on chronic pancreatitis of unknown etiology - Hemoglobin A1c: 10.8  Significant Procedures: None  Significant Labs and Imaging:   Recent Labs Lab 11/14/13 1319 11/15/13 0402 11/18/13 0450  WBC 6.7 5.1 6.2  HGB 12.7 11.3* 11.2*  HCT 39.4 36.9 35.2*  PLT 145* 125* 166    Recent Labs Lab 11/14/13 1319 11/15/13 0402 11/16/13 1120 11/17/13 0603 11/18/13 0450  NA 138 136* 144 143 139  K 4.2 3.9 3.9 3.3* 4.4  CL 98 100 106 107 108  CO2 27 21 24 24 22   GLUCOSE 271* 158* 121* 139* 159*  BUN 8 9 7  4* 3*  CREATININE 0.76 1.31* 0.81  0.75 0.80  CALCIUM 8.5 8.0* 7.6* 7.6* 8.2*  MG  --   --   --  1.6  --   ALKPHOS 106  --   --   --   --   AST 19  --   --   --   --   ALT 26  --   --   --   --   ALBUMIN 3.5  --   --   --   --    CXR (7/27): Bilateral peribronchial thickening and bilateral hazy pulmonary  opacities are  new compared to prior chest radiograph of 01/19/2013.  Pulmonary vascularity appears slightly prominent. Question if  pulmonary findings could reflect mild/early pulmonary edema. Other  considerations include viral infection/bronchitis. Suggest clinical  Correlation.   CT abd/pelvis (7/27): 1. Haziness about the pancreatic body is indicative early/mild acute  pancreatitis.  2. Hepatic steatosis.  3. Midline ventral hernia contains fat.   Results/Tests Pending at Time of Discharge: none  Discharge Medications:    Medication List    ASK your doctor about these medications       ACCU-CHEK COMPACT CARE KIT Kit  1 strip by Does not apply route 4 (four) times daily.     albuterol 108 (90 BASE) MCG/ACT inhaler  Commonly known as:  PROVENTIL HFA;VENTOLIN HFA  Inhale 2 puffs into the lungs every 4 (four) hours as needed for wheezing or shortness of breath.     albuterol (2.5 MG/3ML) 0.083% nebulizer solution  Commonly known as:  PROVENTIL  Take 3 mLs (2.5 mg total) by nebulization every 6 (six) hours as needed for wheezing.     ALREX 0.2 % Susp  Generic drug:  loteprednol  Apply 1-2 drops to eye as needed (itching).     ARTIFICIAL TEARS OP  Place 1 drop into both eyes daily. For dry eyes     aspirin EC 81 MG tablet  Take 81 mg by mouth every morning.     atorvastatin 20 MG tablet  Commonly known as:  LIPITOR  Take 20 mg by mouth every morning.     B-D ULTRAFINE III SHORT PEN 31G X 8 MM Misc  Generic drug:  Insulin Pen Needle  USE 5 TIMES A DAY, WITH LANTUS AND NOVOLOG     beclomethasone 80 MCG/ACT inhaler  Commonly known as:  QVAR  Inhale 2 puffs into the lungs 2 (two) times daily.     ciprofloxacin-dexamethasone otic suspension  Commonly known as:  CIPRODEX  Place 4 drops into the right ear 2 (two) times daily. For 10 days.     clobetasol ointment 0.05 %  Commonly known as:  TEMOVATE  Apply 1 application topically at bedtime. To palms     Difluprednate 0.05 % Emul   Commonly known as:  DUREZOL  Place 2 drops into the right eye 2 (two) times daily.     diphenhydrAMINE 25 mg capsule  Commonly known as:  BENADRYL  Take 25 mg by mouth every 6 (six) hours as needed for itching.     docusate sodium 100 MG capsule  Commonly known as:  COLACE  Take 100 mg by mouth 2 (two) times daily.     DULoxetine 20 MG capsule  Commonly known as:  CYMBALTA  Take 1 capsule (20 mg total) by mouth daily.     erythromycin 250 MG tablet  Commonly known as:  E-MYCIN  Take 1 tablet (250 mg total) by mouth 3 (three) times daily before meals.  esomeprazole 40 MG capsule  Commonly known as:  NEXIUM  Take 1 capsule (40 mg total) by mouth daily before breakfast.     EXCEDRIN MIGRAINE PO  Take 2 capsules by mouth 3 (three) times daily as needed (migraine).     furosemide 20 MG tablet  Commonly known as:  LASIX  Take 1 tablet (20 mg total) by mouth daily as needed for fluid or edema.     glucose blood test strip  Commonly known as:  ACCU-CHEK COMPACT PLUS  Use to test four times a day. Diagnosis code: 250.02     hydrochlorothiazide 25 MG tablet  Commonly known as:  HYDRODIURIL  Take 1 tablet (25 mg total) by mouth every morning.     HYDROcodone-acetaminophen 5-325 MG per tablet  Commonly known as:  NORCO/VICODIN  Take 1-2 tablets by mouth every 6 (six) hours as needed for moderate pain.     HYDROmorphone 4 MG tablet  Commonly known as:  DILAUDID  Take 1 tablet (4 mg total) by mouth every 6 (six) hours as needed for severe pain.     insulin aspart 100 UNIT/ML FlexPen  Commonly known as:  NOVOLOG FLEXPEN  Inject 15 Units into the skin 3 (three) times daily with meals. QS for 1 month supply     Insulin Glargine 100 UNIT/ML Solostar Pen  Commonly known as:  LANTUS  Inject 36 Units into the skin 2 (two) times daily.     lidocaine-prilocaine cream  Commonly known as:  EMLA  Apply 1 application topically 3 (three) times daily as needed (pain).     lisinopril  40 MG tablet  Commonly known as:  PRINIVIL,ZESTRIL  Take 1 tablet (40 mg total) by mouth daily.     meloxicam 7.5 MG tablet  Commonly known as:  MOBIC  Take 1 tablet (7.5 mg total) by mouth daily.     metoCLOPramide 10 MG tablet  Commonly known as:  REGLAN  Take 1 tablet (10 mg total) by mouth 3 (three) times daily before meals.     metoprolol 100 MG tablet  Commonly known as:  LOPRESSOR  Take 2 tablets (200 mg total) by mouth 2 (two) times daily.     naproxen sodium 220 MG tablet  Commonly known as:  ANAPROX  Take 440 mg by mouth 3 (three) times daily with meals as needed.     nicotine polacrilex 4 MG gum  Commonly known as:  NICORETTE  Take 4 mg by mouth as needed for smoking cessation (cravings). Take up to 10 pieces per day     nortriptyline 25 MG capsule  Commonly known as:  PAMELOR  Take 1 capsule (25 mg total) by mouth at bedtime.     nystatin 100000 UNIT/ML suspension  Commonly known as:  MYCOSTATIN  Take 5 mLs (500,000 Units total) by mouth 2 (two) times daily. Swish and swallow     nystatin powder  Commonly known as:  MYCOSTATIN  Apply topically 3 (three) times daily. To affected area     olopatadine 0.1 % ophthalmic solution  Commonly known as:  PATANOL  Place 1 drop into both eyes 2 (two) times daily.     ondansetron 4 MG tablet  Commonly known as:  ZOFRAN  Take 1 tablet (4 mg total) by mouth every 8 (eight) hours as needed for nausea or vomiting. For nausea     onetouch ultrasoft lancets  Use 4 times daily to check cbg     oxyCODONE-acetaminophen 5-325 MG per  tablet  Commonly known as:  ROXICET  Take 1 tablet by mouth every 8 (eight) hours as needed for severe pain.     pentosan polysulfate 100 MG capsule  Commonly known as:  ELMIRON  Take 1 capsule (100 mg total) by mouth 3 (three) times daily before meals.     phenazopyridine 200 MG tablet  Commonly known as:  PYRIDIUM  Take 200 mg by mouth 3 (three) times daily as needed (bladder pain).      polyethylene glycol packet  Commonly known as:  MIRALAX / GLYCOLAX  Take 17 g by mouth every 3 (three) days. Mix in 4-6 oz of water     prednisoLONE acetate 1 % ophthalmic suspension  Commonly known as:  PRED FORTE  Place 1 drop into both eyes 2 (two) times daily.     pregabalin 150 MG capsule  Commonly known as:  LYRICA  Take 1 capsule (150 mg total) by mouth 3 (three) times daily.     QUEtiapine 200 MG tablet  Commonly known as:  SEROQUEL  Take 0.5 tablets (100 mg total) by mouth at bedtime.     ranitidine 150 MG tablet  Commonly known as:  ZANTAC  Take 150 mg by mouth daily as needed. For acid reflux        Discharge Instructions:  - Pain medication: Patient has been prescribed 20 tablets of oxycodone for discharge. She should be advised to take his medication when necessary 4 times a day for the first 2 days; when necessary 3 times a day for the following 2 days; when necessary 2 times a day for the following 2 days; when necessary one time a day for the following 2 days.  Please refer to Patient Instructions section of EMR for full details.  Patient was counseled important signs and symptoms that should prompt return to medical care, changes in medications, dietary instructions, activity restrictions, and follow up appointments.   Follow-Up Appointments: Follow-up Information   Follow up with Lupita Dawn, MD On 11/22/2013. (11:30am)    Specialty:  Family Medicine   Contact information:   [place] [id number] [phone]       Elberta Leatherwood, MD 11/18/2013, 11:58 AM PGY-1, [name]

## 2013-11-17 NOTE — Progress Notes (Signed)
Family Medicine Teaching Service Daily Progress Note Intern Pager: [phone]  [name] [name] name: [name] [name] Medical record number: [id number] Date of birth: 1951/03/25 Age: 63 y.o. Gender: female  Primary Care Provider: Kenn File, MD Consultants: none Code Status: Full  Pt Overview and Major Events to Date:  7/27: Admit for acute on chronic pancreatitis 7/29: PCA DC'd; PO pain management started; Diet to advance as tolerated 7:30: [name] [name] on PO pain management; tolerating well.   Assessment and Plan:  Naomi Castrogiovanni is a 63 y.o. female presenting with epigastric abdominal pain and vomiting, consistent with flare of chronic pancreatitis. PMH is significant for prior cholecystectomy, CAD, GERD, insulin dependent T2DM, HTN, fibromyalgia, recurrent pancreatitis.   # Acute on chronic pancreatitis: Lipase of 233 on Friday>134 on admission, which is above pt's baseline normal level. CT abd/pelvis shows pancreatitis. Triglycerides not elevated enough to cause pancreatitis.  [name] [name] denies alcohol use. - DC'd dilaudid PCA (7/29); Started PO Oxycodone 5mg  Q4H PRN for pain control.  - currently NPO, ADAT - zofran prn nausea/vomiting  - continue home colace (especially given high narcotic dosing)  - No longer NPO (7/29); Started Full Liquid Diet>>to advance as tolerated (no increased pain w/ PO; will encourage nursing staff to advance diet)  # Chest pain/SOB: Resolved. CXR with chronic bronchitis. EKG unchanged from previous. Troponin neg x2. BNP wnl. - Encourage IS - albuterol prn  - continue aspirin 81mg  daily   # AKI: Baseline Cr 0.8, increased to 1.31>>BMP currently pending - Repeat UA pending - IV NS @ 152mL/hr until PO intake is adequate.  # HTN: normotensive currently  - continue metoprolol - holding HCTZ and Lisinopril in setting of AKI   # HLD: ASCVD 12yr risk 27.9% based on lipid panel from admission. -Increase lipitor dose to 80mg  daily, as risk warrants high intensity  statin   # Diabetes Type 2: Uncontrolled, insulin dependent, last A1c April 2015 9.7 > 10.8 on admission. Concern for pancreatic failure given chronic pancreatitis. CBGs well controlled O/N (125-179) - Continue lantus 20 units BID (home dose 36 units BID)  and moderate SSI  # Ophtho: unclear what her ophthalmologic hx is. Has had bilateral cataract surgery in the past. She is on several steroid eye drops - continue home eye drops   # GERD: EGD in Sept 2013 was normal. Unclear if she has an actual diagnosis of gastroparesis (but is on reglan and erythromycin).  - continue PPI, reglan, erythromycin   # Psych: hx of depression/anxiety  - continue seroquel, nortriptyline  - hold home duloxetine for now as it is listed as an allergy. Will need inpatient team/pharmacy to clarify   # Tobacco abuse: smokes approx 6 cigarettes today. Would like nicotine patch.  -low dose nicotine patch while hospitalized   # Polypharmacy: marked polypharm, with 51 items listed on pre-admission med list  -ideally would remove some meds as able, will continue all chronic meds for now  -involve Dr. Valentina Lucks & pharmacy team while inpatient   FEN/GI: NPO, plan to advance to clear liquids today, hydrate IVF NS @ 150cc/hr  Prophylaxis: SQ heparin   Disposition: admit to telemetry, dispo pending clinical evaluation and improvement, ability to tolerate PO   Subjective:  [name] [name] is sitting in chair. NAD. No longer on [place] 2L--room air, O2 Sat 100%; [name] [name] states she is in significantly less pain than yesterday; we DCd her PCA 7/29 and placed her on PO pain management. Still c/o diarrhea. No increase in pain w/ food. Should continue to advance diet.  [name] [name] interested in estimated date of DC.  Objective: Temp:  [98.4 F (36.9 C)-99.1 F (37.3 C)] 98.4 F (36.9 C) (07/30 0601) Pulse Rate:  [79-84] 79 (07/30 0601) Resp:  [16-20] 20 (07/30 0601) BP: (140-151)/(48-67) 147/61 mmHg (07/30 0601) SpO2:  [93 %-100 %] 100 %  (07/30 0601) Weight:  [253 lb 4.9 oz (114.9 kg)] 253 lb 4.9 oz (114.9 kg) (07/30 0601) Physical Exam: General: NAD, sitting in bed. Cardiovascular: RRR, no murmurs, heart sounds distant  Respiratory: CTAB, NWOB  Abdomen: soft, no masses or organomegaly although obesity limits exam. Significant abdominal surgical scars noted, primarily at the LLQ. TTP epigastric area as well as LLQ (improved from 7/29).  Extremities: 2+ DP pulses bilat, no calf tenderness bilat  Skin: chronic dermatitis-type changes of lower extremities  Neuro: A&O x3   Laboratory:  Recent Labs Lab 11/10/13 1205 11/14/13 1319 11/15/13 0402  WBC 6.2 6.7 5.1  HGB 13.0 12.7 11.3*  HCT 39.9 39.4 36.9  PLT 143* 145* 125*    Recent Labs Lab 11/10/13 1205  11/14/13 1319 11/15/13 0402 11/16/13 1120 11/17/13 0603  NA 139  --  138 136* 144 143  K 3.8  --  4.2 3.9 3.9 3.3*  CL 103  --  98 100 106 107  CO2 23  --  27 21 24 24   BUN 8  --  8 9 7  4*  CREATININE 0.80  < > 0.76 1.31* 0.81 0.75  CALCIUM 8.9  --  8.5 8.0* 7.6* 7.6*  PROT 7.4  --  7.3  --   --   --   BILITOT 0.4  --  0.3  --   --   --   ALKPHOS 86  --  106  --   --   --   ALT 22  --  26  --   --   --   AST 23  --  19  --   --   --   GLUCOSE 170*  --  271* 158* 121* 139*  < > = values in this interval not displayed.  Troponins neg x2  Urinalysis    Component Value Date/Time   COLORURINE YELLOW 11/15/2013 2035   APPEARANCEUR CLEAR 11/15/2013 2035   LABSPEC 1.027 11/15/2013 2035   PHURINE 5.5 11/15/2013 2035   GLUCOSEU 100* 11/15/2013 2035   HGBUR SMALL* 11/15/2013 2035   HGBUR negative 02/06/2010 1429   BILIRUBINUR NEGATIVE 11/15/2013 2035   BILIRUBINUR NEG 09/25/2010 0925   KETONESUR NEGATIVE 11/15/2013 2035   PROTEINUR NEGATIVE 11/15/2013 2035   PROTEINUR NEG 09/25/2010 0925   UROBILINOGEN 0.2 11/15/2013 2035   UROBILINOGEN 0.2 09/25/2010 0925   NITRITE NEGATIVE 11/15/2013 2035   NITRITE NEG 09/25/2010 0925   LEUKOCYTESUR NEGATIVE 11/15/2013 2035    BNP  (last 3 results)  Recent Labs  11/14/13 1319  PROBNP 44.1    Lipid Panel     Component Value Date/Time   CHOL 155 11/15/2013 0402   TRIG 217* 11/15/2013 0402   HDL 27* 11/15/2013 0402   CHOLHDL 5.7 11/15/2013 0402   VLDL 43* 11/15/2013 0402   LDLCALC 85 11/15/2013 0402    Hgb A1c: 10.8  Lipase     Component Value Date/Time   LIPASE 134* 11/14/2013 1319      Imaging/Diagnostic Tests: CXR (7/27): Bilateral peribronchial thickening and bilateral hazy pulmonary  opacities are new compared to prior chest radiograph of 01/19/2013.  Pulmonary vascularity appears slightly prominent. Question if  pulmonary findings could reflect mild/early  pulmonary edema. Other  considerations include viral infection/bronchitis. Suggest clinical  Correlation.  CT abd/pelvis (7/27): 1. Haziness about the pancreatic body is indicative early/mild acute  pancreatitis.  2. Hepatic steatosis.  3. Midline ventral hernia contains fat.    Elberta Leatherwood, MD 11/17/2013, 9:22 AM PGY-1, [place] Intern pager: [phone], text pages welcome

## 2013-11-17 NOTE — Care Management Note (Signed)
  Page 1 of 1   11/17/2013     10:51:35 AM CARE MANAGEMENT NOTE 11/17/2013  Patient:  [name] [name], [name] [name]   Account Number:  [id number]  Date Initiated:  11/17/2013  Documentation initiated by:  Magdalen Spatz  Subjective/Objective Assessment:     Action/Plan:   Anticipated DC Date:     Anticipated DC Plan:  HOME/SELF CARE         Choice offered to / List presented to:             Status of service:   Medicare Important Message given?  YES (If response is "NO", the following Medicare IM given date fields will be blank) Date Medicare IM given:  11/17/2013 Medicare IM given by:  Magdalen Spatz Date Additional Medicare IM given:   Additional Medicare IM given by:    Discharge Disposition:    Per UR Regulation:    If discussed at Long Length of Stay Meetings, dates discussed:    Comments:

## 2013-11-18 LAB — BASIC METABOLIC PANEL
Anion gap: 9 (ref 5–15)
BUN: 3 mg/dL — ABNORMAL LOW (ref 6–23)
CO2: 22 mEq/L (ref 19–32)
Calcium: 8.2 mg/dL — ABNORMAL LOW (ref 8.4–10.5)
Chloride: 108 mEq/L (ref 96–112)
Creatinine, Ser: 0.8 mg/dL (ref 0.50–1.10)
GFR calc Af Amer: 88 mL/min — ABNORMAL LOW (ref >90)
GFR calc non Af Amer: 76 mL/min — ABNORMAL LOW (ref >90)
Glucose, Bld: 159 mg/dL — ABNORMAL HIGH (ref 70–99)
Potassium: 4.4 mEq/L (ref 3.7–5.3)
Sodium: 139 mEq/L (ref 137–147)

## 2013-11-18 LAB — CBC
HCT: 35.2 % — ABNORMAL LOW (ref 36.0–46.0)
Hemoglobin: 11.2 g/dL — ABNORMAL LOW (ref 12.0–15.0)
MCH: 27.9 pg (ref 26.0–34.0)
MCHC: 31.8 g/dL (ref 30.0–36.0)
MCV: 87.8 fL (ref 78.0–100.0)
Platelets: 166 10*3/uL (ref 150–400)
RBC: 4.01 MIL/uL (ref 3.87–5.11)
RDW: 14.1 % (ref 11.5–15.5)
WBC: 6.2 10*3/uL (ref 4.0–10.5)

## 2013-11-18 LAB — GLUCOSE, CAPILLARY
Glucose-Capillary: 149 mg/dL — ABNORMAL HIGH (ref 70–99)
Glucose-Capillary: 248 mg/dL — ABNORMAL HIGH (ref 70–99)

## 2013-11-18 MED ORDER — OXYCODONE HCL 5 MG PO TABS: ORAL_TABLET | ORAL | Status: DC

## 2013-11-18 NOTE — Discharge Summary (Signed)
I discussed with  Dr McKeag.  I agree with their plans documented in their progress note.  

## 2013-11-22 ENCOUNTER — Ambulatory Visit (INDEPENDENT_AMBULATORY_CARE_PROVIDER_SITE_OTHER): Payer: Commercial Managed Care - HMO | Admitting: Family Medicine

## 2013-11-22 ENCOUNTER — Encounter: Payer: Self-pay | Admitting: Family Medicine

## 2013-11-22 VITALS — BP 155/83 | HR 77 | Temp 99.0°F | Ht 63.0 in | Wt 230.8 lb

## 2013-11-22 DIAGNOSIS — K859 Acute pancreatitis without necrosis or infection, unspecified: Secondary | ICD-10-CM

## 2013-11-22 DIAGNOSIS — R21 Rash and other nonspecific skin eruption: Secondary | ICD-10-CM | POA: Insufficient documentation

## 2013-11-22 DIAGNOSIS — Z716 Tobacco abuse counseling: Secondary | ICD-10-CM

## 2013-11-22 DIAGNOSIS — K861 Other chronic pancreatitis: Secondary | ICD-10-CM

## 2013-11-22 DIAGNOSIS — L270 Generalized skin eruption due to drugs and medicaments taken internally: Secondary | ICD-10-CM

## 2013-11-22 DIAGNOSIS — Z7189 Other specified counseling: Secondary | ICD-10-CM

## 2013-11-22 DIAGNOSIS — M7989 Other specified soft tissue disorders: Secondary | ICD-10-CM

## 2013-11-22 DIAGNOSIS — F17200 Nicotine dependence, unspecified, uncomplicated: Secondary | ICD-10-CM

## 2013-11-22 MED ORDER — NICOTINE 14 MG/24HR TD PT24: 14.0000 mg | MEDICATED_PATCH | Freq: Every day | TRANSDERMAL | Status: DC

## 2013-11-22 NOTE — Patient Instructions (Addendum)
  Clear Liquid Diet A clear liquid diet is a short-term diet. It is made up of liquids or solids that become liquids at room temperature. You should be able to see through the liquid. WHAT CAN I HAVE? You may have any of the following if your doctor says they are okay:  Vegetable juices or fruit juices that do not have pulp.  Coffee.  Tea.  Soda.  Clear broth or strained broth-based soups.  High-protein gelatins and flavored gelatins.  Sugar and honey.  Ices or frozen ice pops that do not have milk. If you are not sure whether you can have a certain liquid, ask your doctor. You may also ask your doctor about other clear liquid options. Document Released: 03/20/2008 Document Revised: 04/12/2013 Document Reviewed: 03/04/2013 [facility] Patient Information 2015 [place], [place]. This information is not intended to replace advice given to you by your health care provider. Make sure you discuss any questions you have with your health care provider.  Congratulations on quitting smoking - a prescription for a nicotine patch has been sent to the pharmacy for you.  Pancreatitis - eat a bland diet, Dr. Ree Kida will call you with your lab work. Attempt trial of Tylenol for pain.   Leg swelling - wear compression stockings during the day

## 2013-11-22 NOTE — Assessment & Plan Note (Addendum)
The patient also reports bilateral leg swelling and hyperpigmented discoloration localized to the lower legs bilaterally. The DDx for lower extremity edema includes venous insufficiency, CHF, liver failure, kidney dz, cellulitis, and DVT. This is less likely due to CHF because the patient does not complain of difficulty breathing or orthopnea. She did not have crackles on lung examination and no signs of S3 or S4 on cardiac ausculation. She also had a normal pro-BNP of 44.1 on 11/14/2013 while in the hospital. This is less likely due to liver disease or kidney disease because the patient had a normal Creatine of 0.8 on 11/18/2013 and a normal AST 19, ALT 26, and Albumin 3.5 on 11/14/2013. This does not appear to be cellulitis since the leg swelling is bilateral. There is also no associated erythema or redness.  This also does not appear to be a DVT since the leg swelling is bilateral. There is no associated erythema or warmth to the area.  -This is most likely due to venous stasis considering the hyperpigmented discoloration to both lower legs bilaterally.  -Will order the patient compression stockings and see if this helps.  -Also discussed with the patient elevating her legs at the end of the day.  -F/u in 1 week   Maudie Mercury, MS3 11/22/2013   I agree with the assessment and plan as documented. LE edema likely due to venous insufficiency. Attempt trial of compression stockings.   Dossie Arbour MD

## 2013-11-22 NOTE — Progress Notes (Signed)
Subjective:     Patient ID: [name] [name], female   DOB: 14-Nov-1950, 63 y.o.   MRN: [id number]  HPI The patient is a 63 yo female that presents to the clinic for a f/u of hospital admission for pancreatitis on 11/14/2013. She was admitted complaining of epigastric abdominal pain, nausea, vomiting, mild diarrhea, subjective fever, and chills. She was discharged from the hospital on 11/17/2013 with oxycodone hcl 5 mg for pain. She reports this is the 9th time she has been hospitalized for pancreatitis, and she has never been told why she has chronic pancreatitis.   Today, she complains of fatigue, chills, and epigastric abdominal pain that radiates to the left abdomen, LLQ, and lower back. She describes the pain as a constant, sharp pain that "feels like needles." She rates the pain as a 10/10 on the pain scale today. She also reports dizziness when she woke-up this AM. She has not had any further vomiting or diarrhea since discharge, but does complain of nausea. She has been taking Ondansetron 3x daily for her nausea.  She has had decreased intake of food and fluids. She ate macaroni and cheese and applesauce last night, which was tolerated well. She denies any fevers since discharge.  She denies recent trauma to her abdomen, alcohol use, recreational drug use, and family hx of pancreatitis. She also reports she had her gallbladder removed 6-7 years ago for gallstones. Denies any recent travel.   She also reports she stopped taking her oxycodone Hcl 5 mg for pain yesterday due to the development of a pruritic rash on her face, arms, and behind her ears. She also reports the rash burns. She thought this may be a result of the pain medication she is taking. She did have a rash from a morphine pump in the past.  She has been taking tylenol for the pain since she stopped the oxycodone with no relief.   Patient also complains of leg swelling and hyperpigmented discoloration on her shins for the past few months.     Review of Systems  Constitutional: Positive for chills. Negative for fever.  Cardiovascular: Positive for leg swelling.  Gastrointestinal: Positive for nausea and abdominal pain. Negative for vomiting and diarrhea.  As per HPI.   Objective:   Physical Exam BP 155/83  Pulse 77  Temp(Src) 99 F (37.2 C) (Oral)  Ht 5\' 3"  (1.6 m)  Wt 230 lb 12.8 oz (104.69 kg)  BMI 40.89 kg/m2 GEN: Well-appearing. NAD.  CARDIAC: RRR. Normal S1 and S2. No murmurs, rubs, or gallops.  LUNG: CTA. No wheezes. No crackles.  ABD: Soft. Obese. Normoactive bowel sounds. Mild diffuse tenderness, worse in LLQ. Mild L CVA tenderness. No rebound or guarding.  Extremities: Mild LE edema with hyperpigmented discoloration localized to the anterior lower legs bilaterally. Skin: Diffuse maculopapular rash localized to face and behind ears bilaterally.   Assessment:   The patient is a 63 yo female with a PMH of chronic pancreatitis who presents to the clinic for a f/u of hospital admission for pancreatitis from 11/14/2013-11/17/2013 still complaining of epigastric pain radiating to her LLQ and left lower back with associated nausea.    Plan:  See specific problem list for assessment and plan.

## 2013-11-22 NOTE — Assessment & Plan Note (Addendum)
The differential diagnosis for chronic pancreatitis includes idiopathic, autoimmune, elevated triglycerides, hereditary pancreatitis, complication of alcohol/drug use, gallstones, trauma and pancreatic duct obstruction. This is less likely due to elevated triglycerides. The patient's triglycerides were elevated to 217 one week ago, but this is not likely high enough to cause pancreatitis.  This is less likely due to hereditary pancreatitis, because the patient denies any family history of pancreatitis. The patient also denies alcohol or recreational drug use making these causes less likely. The patient had her gallbladder removed 6-7 years ago, ruling out gallstones as the cause of her pancreatitis. The patient also denies any recent trauma. This is less likely pancreatic duct obstruction since the patient had a normal MRCP on 03/30/2010. -Will order ANA on the patient to test for autoimmune pancreatitis  -Idiopathic pancreatitis is still possible. Discussed with patient smoking cessation to decrease attacks of pancreatitis. Patient expressed interest in starting the Nicotine patch. Will start this today.  -Patient also developed a pruritic maculopapular rash on her face and behind her ears since starting the oxycodone hcl 5 mg. Will stop the oxycodone today. Patient also has a history of pruritis with Tramadol. Suggested the patient use Tylenol PRN for her pain. Her last Creatinine was normal at 0.8 on 11/18/2013. Her last LFT's were also normal on 11/14/2013 (AST 19, ALT 26, and Albumin 3.5).  -F/u in 1 week -Call sooner or return to ED if symptoms worsen   Maudie Mercury, MS3 11/22/2013    I agree with the assessment and plan as documented. Patient has acute on chronic pancreatitis. Source for initial pancreatitis unclear. She likely has chronic inflammation of the pancreas with acute flares. Check ANA to evaluate for possible autoimmune cause. Encouraged smoking cessation.  Dossie Arbour MD

## 2013-11-22 NOTE — Assessment & Plan Note (Addendum)
Discussed with patient the risk of cigarette smoking and worsening her pancreatitis symptoms. Patient expresses interest in trying the nicotine patch today.  -Will start nicotine patch today.  -F/u in 1 week.   Maudie Mercury, MS3 11/22/2013  I agree with the assessment and plan as documented. Start Nicotine patch to help with cravings. Follow with PCP.  Dossie Arbour MD

## 2013-11-23 ENCOUNTER — Encounter: Payer: Self-pay | Admitting: Family Medicine

## 2013-11-23 LAB — ANA: Anti Nuclear Antibody(ANA): NEGATIVE

## 2013-11-24 DIAGNOSIS — L270 Generalized skin eruption due to drugs and medicaments taken internally: Secondary | ICD-10-CM | POA: Insufficient documentation

## 2013-11-24 MED ORDER — NICOTINE 14 MG/24HR TD PT24: 14.0000 mg | MEDICATED_PATCH | Freq: Every day | TRANSDERMAL | Status: DC

## 2013-11-24 NOTE — Addendum Note (Signed)
Addended by: Lupita Dawn on: 11/24/2013 10:00 AM   Modules accepted: Orders, Medications

## 2013-11-24 NOTE — Progress Notes (Addendum)
   Subjective:    Patient ID: [name] [name], female    DOB: [birth year], 63 y.o.   MRN: [id number]  HPI 63 y/o female presents for hospital follow up, admitted from 11/14/13 - 11/17/13 for acute on chronic pancreatitis.   Acute on chronic pancreatitis - patient states that she continues to have pain diffusely in her abdomen, primarily starts in the epigastric region and radiates to left side, associated nausea, tolerating some diet, taking Zofran as needed for nausea, she is interested in determining the cause of her pancreatitis, she is s/p   Rash - patient discharged with oxycodone for pain control, she has since developed a rash on her face and upper extremities, she has since stopped this medication and her symptoms are gradually improving, she has had similar rash with tramadol in the past, no difficulty breathing  Tobacco Abuse - patient interested in smoking cessation, would like to try nicotine patch  Social - patient denies alcohol use Review of Systems See HPI and Medical student note.     Objective:   Physical Exam Vitals: reviewed Gen: pleasant AAF, NAD HEENT: normocephalic, PERRL, EOMI, MMM, neck supple Cardiac: RRR, S1 and S2 present, no murmurs, no heaves/thrills Resp: CTAB, normal effort Abd: obese, no distension, diffuse tenderness (worse in LLQ), normal bowel sounds Ext: 1+ pedal edema up to knees Skin: hyperpigmentation over anterior shins consistent with chronic stasis changes, maculo-papular rash of face and upper extremities  CT abdomen/pelvis during hospitalization - haziness about the pancreas consistent with pancreatitis, stomach/small bowel/colon unremarkable  Reviewed MRCP from 2011 with showed no bile duct dilatation, postsurgical changes from cholecystectomy Previous lipid profile showed only mildly elevated triglycerides    Assessment & Plan:  Please see problem specific assessment and plan.  I have reviewed the HPI completed by [place] and  agree with her documentation.

## 2013-11-24 NOTE — Assessment & Plan Note (Signed)
Allergic reaction/rash due to oxycodone.  -Discontinue oxycodone -Unable to give narcotics for pain due to rash from multiple narcotics -Patient to take Tylenol as needed for pain

## 2013-12-05 ENCOUNTER — Ambulatory Visit: Payer: Commercial Managed Care - HMO | Admitting: Family Medicine

## 2013-12-06 ENCOUNTER — Ambulatory Visit: Payer: Commercial Managed Care - HMO | Admitting: Pharmacist

## 2013-12-20 ENCOUNTER — Encounter: Payer: Self-pay | Admitting: Family Medicine

## 2013-12-20 ENCOUNTER — Ambulatory Visit (INDEPENDENT_AMBULATORY_CARE_PROVIDER_SITE_OTHER): Payer: Commercial Managed Care - HMO | Admitting: Family Medicine

## 2013-12-20 VITALS — BP 189/95 | HR 97 | Temp 98.1°F | Ht 63.0 in | Wt 230.2 lb

## 2013-12-20 DIAGNOSIS — R51 Headache: Secondary | ICD-10-CM

## 2013-12-20 DIAGNOSIS — R519 Headache, unspecified: Secondary | ICD-10-CM | POA: Insufficient documentation

## 2013-12-20 NOTE — Progress Notes (Addendum)
Patient ID: [name] [name], female   DOB: 10-17-1950, 63 y.o.   MRN: [id number]  Kenn File, MD Phone: [phone]  Subjective:  Chief complaint-noted  Pt Here for Headache  Complains of several months of HA described as R sided throbbing HA behind eyes and also frequent recent Headaches with Neck pain radiating up to her head and down to her R arm.   States that NSAIDs and home meds have not helped Had an argument today with her son and is now having "worse headache of her life" Also complains of twitching lips and generalized weakness, cannot endorse unilateral weakness or numbness.   States he went blind when it began, now having shiny floaters, had a recent "black spider web" in her vision of her R eye.   ROS-  Per HPI  Past Medical History Patient Active Problem List   Diagnosis Date Noted  . Headache(784.0) 12/20/2013  . Drug rash 11/24/2013  . Leg swelling 11/22/2013  . Acute pancreatitis 11/14/2013  . Otitis externa 10/11/2013  . Acute sinusitis 08/12/2013  . Candidal intertrigo 05/11/2013  . Dyshidrotic hand dermatitis 05/11/2013  . Fibromyalgia 02/14/2013  . Diabetic peripheral neuropathy 02/14/2013  . Ingrown nail 01/04/2013  . H/O ventral hernia repair 08/13/2012  . Benign neoplasm of colon 12/31/2011  . Epigastric abdominal pain 12/29/2011  . Cough 12/17/2011  . CAD (coronary artery disease) 11/25/2011  . Annual physical exam 11/04/2011  . Chest pain 10/31/2011  . Neuropathy 03/01/2011  . Tinea pedis 02/17/2011  . Palpitations 01/24/2011  . Umbilical hernia 81/04/7508  . Diabetic gastroparesis associated with type 2 diabetes mellitus 11/07/2010  . Pancreatitis, recurrent 11/07/2010  . Anemia 09/25/2010  . Allergic rhinitis 06/28/2010  . CONSTIPATION 02/26/2010  . DM (diabetes mellitus), type 2, uncontrolled 09/10/2009  . DEPRESSION, MAJOR, WITH PSYCHOTIC BEHAVIOR 08/14/2009  . Chronic pain syndrome 08/14/2009  . CATARACT EXTRACTIONS, BILATERAL,  HX OF 08/14/2009  . Tobacco abuse counseling 12/30/2008  . GASTROESOPHAGEAL REFLUX DISEASE, CHRONIC 02/11/2008  . HYPERTENSION, BENIGN ESSENTIAL 10/21/2006  . URINARY INCONTINENCE 10/21/2006    Medications- reviewed and updated Current Outpatient Prescriptions  Medication Sig Dispense Refill  . albuterol (PROVENTIL HFA;VENTOLIN HFA) 108 (90 BASE) MCG/ACT inhaler Inhale 2 puffs into the lungs every 4 (four) hours as needed for wheezing or shortness of breath.      Marland Kitchen albuterol (PROVENTIL) (2.5 MG/3ML) 0.083% nebulizer solution Take 3 mLs (2.5 mg total) by nebulization every 6 (six) hours as needed for wheezing.  150 mL  1  . ALREX 0.2 % SUSP Apply 1-2 drops to eye as needed (itching).       Marland Kitchen aspirin EC 81 MG tablet Take 81 mg by mouth every morning.       . Aspirin-Acetaminophen-Caffeine (EXCEDRIN MIGRAINE PO) Take 2 capsules by mouth 3 (three) times daily as needed (migraine).       Marland Kitchen atorvastatin (LIPITOR) 20 MG tablet Take 20 mg by mouth every morning.      . B-D ULTRAFINE III SHORT PEN 31G X 8 MM MISC USE 5 TIMES A DAY, WITH LANTUS AND NOVOLOG  100 each  PRN  . beclomethasone (QVAR) 80 MCG/ACT inhaler Inhale 2 puffs into the lungs 2 (two) times daily.  1 Inhaler  12  . Blood Glucose Monitoring Suppl (ACCU-CHEK COMPACT CARE KIT) KIT 1 strip by Does not apply route 4 (four) times daily.  120 each  11  . clobetasol ointment (TEMOVATE) 2.58 % Apply 1 application topically at bedtime. To palms  30  g  0  . Difluprednate (DUREZOL) 0.05 % EMUL Place 2 drops into the right eye 2 (two) times daily.  5 mL  6  . diphenhydrAMINE (BENADRYL) 25 mg capsule Take 25 mg by mouth every 6 (six) hours as needed for itching.       . docusate sodium (COLACE) 100 MG capsule Take 100 mg by mouth 2 (two) times daily.       . DULoxetine (CYMBALTA) 20 MG capsule Take 1 capsule (20 mg total) by mouth daily.  30 capsule  3  . erythromycin (E-MYCIN) 250 MG tablet Take 1 tablet (250 mg total) by mouth 3 (three) times  daily before meals.  90 tablet  3  . esomeprazole (NEXIUM) 40 MG capsule Take 1 capsule (40 mg total) by mouth daily before breakfast.  90 capsule  3  . furosemide (LASIX) 20 MG tablet Take 1 tablet (20 mg total) by mouth daily as needed for fluid or edema.  30 tablet  3  . glucose blood (ACCU-CHEK COMPACT PLUS) test strip Use to test four times a day. Diagnosis code: 250.02  100 each  12  . hydrochlorothiazide (HYDRODIURIL) 25 MG tablet Take 1 tablet (25 mg total) by mouth every morning.  90 tablet  3  . HYDROcodone-acetaminophen (NORCO/VICODIN) 5-325 MG per tablet Take 1-2 tablets by mouth every 6 (six) hours as needed for moderate pain.  20 tablet  0  . Hypromellose (ARTIFICIAL TEARS OP) Place 1 drop into both eyes daily. For dry eyes      . insulin aspart (NOVOLOG FLEXPEN) 100 UNIT/ML FlexPen Inject 15 Units into the skin 3 (three) times daily with meals. QS for 1 month supply  2 pen  0  . Insulin Glargine (LANTUS) 100 UNIT/ML Solostar Pen Inject 36 Units into the skin 2 (two) times daily.  3 mL  0  . Lancets (ONETOUCH ULTRASOFT) lancets Use 4 times daily to check cbg  100 each  12  . lidocaine-prilocaine (EMLA) cream Apply 1 application topically 3 (three) times daily as needed (pain).       Marland Kitchen lisinopril (PRINIVIL,ZESTRIL) 40 MG tablet Take 1 tablet (40 mg total) by mouth daily.  30 tablet  6  . meloxicam (MOBIC) 7.5 MG tablet Take 1 tablet (7.5 mg total) by mouth daily.  30 tablet  3  . metoprolol (LOPRESSOR) 100 MG tablet Take 2 tablets (200 mg total) by mouth 2 (two) times daily.  120 tablet  6  . naproxen sodium (ANAPROX) 220 MG tablet Take 440 mg by mouth 3 (three) times daily with meals as needed.      . nicotine (NICODERM CQ - DOSED IN MG/24 HOURS) 14 mg/24hr patch Place 1 patch (14 mg total) onto the skin daily.  28 patch  0  . nortriptyline (PAMELOR) 25 MG capsule Take 1 capsule (25 mg total) by mouth at bedtime.  30 capsule  3  . nystatin (MYCOSTATIN) 100000 UNIT/ML suspension Take 5  mLs (500,000 Units total) by mouth 2 (two) times daily. Swish and swallow  60 mL  1  . nystatin (MYCOSTATIN) powder Apply topically 3 (three) times daily. To affected area  30 g  3  . olopatadine (PATANOL) 0.1 % ophthalmic solution Place 1 drop into both eyes 2 (two) times daily.  5 mL  12  . ondansetron (ZOFRAN) 4 MG tablet Take 1 tablet (4 mg total) by mouth every 8 (eight) hours as needed for nausea or vomiting. For nausea  30 tablet  3  . pentosan polysulfate (ELMIRON) 100 MG capsule Take 1 capsule (100 mg total) by mouth 3 (three) times daily before meals.  270 capsule  1  . phenazopyridine (PYRIDIUM) 200 MG tablet Take 200 mg by mouth 3 (three) times daily as needed (bladder pain).       . polyethylene glycol (GLYCOLAX) packet Take 17 g by mouth every 3 (three) days. Mix in 4-6 oz of water      . prednisoLONE acetate (PRED FORTE) 1 % ophthalmic suspension Place 1 drop into both eyes 2 (two) times daily.  5 mL  6  . pregabalin (LYRICA) 150 MG capsule Take 1 capsule (150 mg total) by mouth 3 (three) times daily.  90 capsule  3  . QUEtiapine (SEROQUEL) 200 MG tablet Take 0.5 tablets (100 mg total) by mouth at bedtime.  30 tablet  6  . ranitidine (ZANTAC) 150 MG tablet Take 150 mg by mouth daily as needed. For acid reflux       No current facility-administered medications for this visit.    Objective: BP 189/95  Pulse 97  Temp(Src) 98.1 F (36.7 C) (Oral)  Ht 5' 3"  (1.6 m)  Wt 230 lb 3.2 oz (104.418 kg)  BMI 40.79 kg/m2 Gen: NAD, alert, cooperative with exam HEENT: NCAT, no papilledema, EOMI, vision tested on snellen by nursing is 20/100 BL without correction, intact to identifying fingers CV: RRR, good S1/S2, no murmur Resp: CTABL, no wheezes, non-labored Ext: No edema, warm Neuro: Alert and oriented, strength 3/5 in BL upper and lower extremities and symmetric   Assessment/Plan:  Headache(784.0) Headache with several alarming features includuing "worst headache of my life"  worsening vision with objective findings here, scotoma and questionable amaurosis fugax, and HTN to 081 systolic Send to ED for emergent eval, likely warrants MRI brain Describes tension type HA awith chronic migraine symptoms.

## 2013-12-20 NOTE — Assessment & Plan Note (Signed)
Headache with several alarming features includuing "worst headache of my life" worsening vision with objective findings here, scotoma and questionable amaurosis fugax, and HTN to 093 systolic Send to ED for emergent eval, likely warrants MRI brain Describes tension type HA awith chronic migraine symptoms.

## 2013-12-20 NOTE — Patient Instructions (Addendum)
Your Headache has several alarming features, Because of this I think you should be seen in the emergency room.   The features are : "worst headache of my life" Shiny floaters and recent dark "spider web over a single eye, Momentary blindness during severe pain, and 20/100 vision IN both eyes. With blood pressure of 189/95.

## 2013-12-23 ENCOUNTER — Encounter (HOSPITAL_COMMUNITY): Payer: Self-pay | Admitting: Emergency Medicine

## 2013-12-23 ENCOUNTER — Encounter: Payer: Self-pay | Admitting: Pharmacist

## 2013-12-23 ENCOUNTER — Emergency Department (HOSPITAL_COMMUNITY)
Admission: EM | Admit: 2013-12-23 | Discharge: 2013-12-23 | Disposition: A | Discharge status: Home / Self Care | Payer: Medicare HMO | Attending: Emergency Medicine | Admitting: Emergency Medicine

## 2013-12-23 ENCOUNTER — Ambulatory Visit (INDEPENDENT_AMBULATORY_CARE_PROVIDER_SITE_OTHER): Payer: Commercial Managed Care - HMO | Admitting: Pharmacist

## 2013-12-23 VITALS — BP 168/92 | HR 80 | Ht 63.0 in | Wt 228.6 lb

## 2013-12-23 DIAGNOSIS — Z791 Long term (current) use of non-steroidal anti-inflammatories (NSAID): Secondary | ICD-10-CM | POA: Insufficient documentation

## 2013-12-23 DIAGNOSIS — IMO0002 Reserved for concepts with insufficient information to code with codable children: Secondary | ICD-10-CM | POA: Diagnosis not present

## 2013-12-23 DIAGNOSIS — R011 Cardiac murmur, unspecified: Secondary | ICD-10-CM | POA: Diagnosis not present

## 2013-12-23 DIAGNOSIS — E119 Type 2 diabetes mellitus without complications: Secondary | ICD-10-CM | POA: Diagnosis not present

## 2013-12-23 DIAGNOSIS — Z7982 Long term (current) use of aspirin: Secondary | ICD-10-CM | POA: Diagnosis not present

## 2013-12-23 DIAGNOSIS — Z862 Personal history of diseases of the blood and blood-forming organs and certain disorders involving the immune mechanism: Secondary | ICD-10-CM | POA: Insufficient documentation

## 2013-12-23 DIAGNOSIS — G8929 Other chronic pain: Secondary | ICD-10-CM | POA: Diagnosis not present

## 2013-12-23 DIAGNOSIS — I10 Essential (primary) hypertension: Secondary | ICD-10-CM

## 2013-12-23 DIAGNOSIS — G44209 Tension-type headache, unspecified, not intractable: Secondary | ICD-10-CM | POA: Insufficient documentation

## 2013-12-23 DIAGNOSIS — F329 Major depressive disorder, single episode, unspecified: Secondary | ICD-10-CM | POA: Insufficient documentation

## 2013-12-23 DIAGNOSIS — H9190 Unspecified hearing loss, unspecified ear: Secondary | ICD-10-CM | POA: Diagnosis not present

## 2013-12-23 DIAGNOSIS — M129 Arthropathy, unspecified: Secondary | ICD-10-CM | POA: Diagnosis not present

## 2013-12-23 DIAGNOSIS — Z792 Long term (current) use of antibiotics: Secondary | ICD-10-CM | POA: Insufficient documentation

## 2013-12-23 DIAGNOSIS — F3289 Other specified depressive episodes: Secondary | ICD-10-CM | POA: Insufficient documentation

## 2013-12-23 DIAGNOSIS — Z872 Personal history of diseases of the skin and subcutaneous tissue: Secondary | ICD-10-CM | POA: Diagnosis not present

## 2013-12-23 DIAGNOSIS — Z8701 Personal history of pneumonia (recurrent): Secondary | ICD-10-CM | POA: Insufficient documentation

## 2013-12-23 DIAGNOSIS — Z794 Long term (current) use of insulin: Secondary | ICD-10-CM | POA: Diagnosis not present

## 2013-12-23 DIAGNOSIS — F411 Generalized anxiety disorder: Secondary | ICD-10-CM | POA: Diagnosis not present

## 2013-12-23 DIAGNOSIS — R51 Headache: Secondary | ICD-10-CM | POA: Insufficient documentation

## 2013-12-23 DIAGNOSIS — G894 Chronic pain syndrome: Secondary | ICD-10-CM | POA: Insufficient documentation

## 2013-12-23 DIAGNOSIS — Z91040 Latex allergy status: Secondary | ICD-10-CM | POA: Diagnosis not present

## 2013-12-23 DIAGNOSIS — J45909 Unspecified asthma, uncomplicated: Secondary | ICD-10-CM | POA: Insufficient documentation

## 2013-12-23 DIAGNOSIS — Z79899 Other long term (current) drug therapy: Secondary | ICD-10-CM | POA: Diagnosis not present

## 2013-12-23 DIAGNOSIS — K219 Gastro-esophageal reflux disease without esophagitis: Secondary | ICD-10-CM | POA: Insufficient documentation

## 2013-12-23 DIAGNOSIS — E669 Obesity, unspecified: Secondary | ICD-10-CM | POA: Diagnosis not present

## 2013-12-23 DIAGNOSIS — Z8673 Personal history of transient ischemic attack (TIA), and cerebral infarction without residual deficits: Secondary | ICD-10-CM | POA: Diagnosis not present

## 2013-12-23 MED ORDER — PROCHLORPERAZINE EDISYLATE 5 MG/ML IJ SOLN
10.0000 mg | Freq: Once | INTRAMUSCULAR | Status: AC
  Administered 2013-12-23: 10 mg via INTRAMUSCULAR
  Filled 2013-12-23: qty 2

## 2013-12-23 MED ORDER — DIPHENHYDRAMINE HCL 50 MG/ML IJ SOLN
25.0000 mg | Freq: Once | INTRAMUSCULAR | Status: AC
  Administered 2013-12-23: 25 mg via INTRAMUSCULAR
  Filled 2013-12-23: qty 1

## 2013-12-23 MED ORDER — KETOROLAC TROMETHAMINE 60 MG/2ML IM SOLN
60.0000 mg | Freq: Once | INTRAMUSCULAR | Status: AC
  Administered 2013-12-23: 60 mg via INTRAMUSCULAR
  Filled 2013-12-23: qty 2

## 2013-12-23 NOTE — Assessment & Plan Note (Signed)
History of longstanding hypertension, re-presents with severe headache. Consulted Dr. Gwendlyn Deutscher, who recommended patient go to the ED for workup.
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
## 2013-12-23 NOTE — Discharge Instructions (Signed)

## 2013-12-23 NOTE — Progress Notes (Signed)
I was consulted to see this patient who was originally scheduled with Dr Maryjean Ka for smoking cessation and blood pressure evaluation. Patient mentioned she has been having worsening headache since her last visit to the clinic. Last clinic note was reviewed, her BP was elevated at the time and there was concern with vision changes. I went in to see patient, she endorsed frontal headache which is not going away. BP checked today improved from last visit. I recommended patient going for ED evaluation and CT head imaging since there is no available resident/attending provider to fully assess her this morning. Patient agreed to go to the ED and she was wheeled over by nursing staff Herbie Baltimore)
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
## 2013-12-23 NOTE — ED Notes (Signed)
Pt reports H/a for the last 3 weeks. States pain radiates down right arm and into neck. Pt reports hx of HBP, taking medications as prescribed. Pt awake, alert, oriented x4.

## 2013-12-23 NOTE — ED Notes (Signed)
Brought pt back to room via wheelchair; pt getting undressed and into a gown at this time; pt given warm blankets; Santiago Glad, RN aware of pt

## 2013-12-23 NOTE — Patient Instructions (Signed)
Sending patient to the ER due to headache with 10/10 pain. Headache has been going on for 3 weeks now. BP was 168/92 today.

## 2013-12-23 NOTE — Progress Notes (Signed)
S:    Patient arrives noticeably depressed and in pain.    She presents to the clinic for blood pressure evaluation and smoking cessation follow up.     Patient saw Dr. Wendi Snipes 12/20/13 and was told to go to the ED for headache work up. Patient did not go to the ED and went home, in hopes that headache would improve on its on. She reports 10/10 pain in her head and reports visual changes, such as seeing "spider webs" and speckles. She denies any significant resolution with any abortive therapies.   Patient reports medication adherence.   A/P: History of longstanding hypertension, re-presents with severe headache. Consulted Dr. Gwendlyn Deutscher, who recommended patient go to the ED for workup.   Results reviewed and written information provided.   F/U Clinic Visit with Dr. Wendi Snipes.  Total time in face-to-face counseling 15 minutes.  Patient seen with Gloriajean Dell, PharmD Candidate;  Fuller Canada,  PharmD Resident, and Nicoletta Ba, PharmD resident.

## 2013-12-23 NOTE — ED Notes (Signed)
Pt alert and oriented at discharge.  Pt provided a wheelchair to the waiting room at discharge.

## 2013-12-27 NOTE — Progress Notes (Signed)
Patient ID: [name] [name], female   DOB: 26-Dec-1950, 63 y.o.   MRN: [id number] Reviewed: Agree with Dr. Graylin Shiver documentation and management.

## 2014-01-02 ENCOUNTER — Ambulatory Visit: Payer: Commercial Managed Care - HMO | Admitting: Family Medicine

## 2014-01-06 ENCOUNTER — Encounter: Payer: Self-pay | Admitting: Pharmacist

## 2014-01-06 ENCOUNTER — Ambulatory Visit (INDEPENDENT_AMBULATORY_CARE_PROVIDER_SITE_OTHER): Payer: Commercial Managed Care - HMO | Admitting: Pharmacist

## 2014-01-06 VITALS — BP 160/80 | HR 100 | Ht 63.0 in | Wt 222.6 lb

## 2014-01-06 DIAGNOSIS — E1165 Type 2 diabetes mellitus with hyperglycemia: Secondary | ICD-10-CM

## 2014-01-06 DIAGNOSIS — IMO0001 Reserved for inherently not codable concepts without codable children: Secondary | ICD-10-CM

## 2014-01-06 DIAGNOSIS — I10 Essential (primary) hypertension: Secondary | ICD-10-CM

## 2014-01-06 DIAGNOSIS — IMO0002 Reserved for concepts with insufficient information to code with codable children: Secondary | ICD-10-CM

## 2014-01-06 MED ORDER — CARVEDILOL 25 MG PO TABS: 25.0000 mg | ORAL_TABLET | Freq: Two times a day (BID) | ORAL | Status: DC

## 2014-01-06 MED ORDER — GLUCOSE BLOOD VI STRP: ORAL_STRIP | Status: DC

## 2014-01-06 MED ORDER — RELION LANCETS ULTRA-THIN 30G MISC: 1.0000 | Freq: Once | Status: DC

## 2014-01-06 MED ORDER — RELION ULTIMA GLUCOSE SYSTEM W/DEVICE KIT: 1.0000 | PACK | Freq: Once | Status: DC

## 2014-01-06 MED ORDER — INSULIN GLARGINE 100 UNIT/ML SOLOSTAR PEN: 40.0000 [IU] | PEN_INJECTOR | Freq: Two times a day (BID) | SUBCUTANEOUS | Status: DC

## 2014-01-06 NOTE — Progress Notes (Signed)
S:    Patient arrives in poor spirits. She reports that she is in pain from her back and shoulder. She also reports that she is frustrated that she had to go to the ED after her last visit with [place].    Presents for diabetes follow up.  Patient did not have a blood glucose meter for this visit. She could not afford the meter at [facility], reporting that it was $48. Therefore, her sister has been taking her blood glucose for her.   Reports one episode of hypoglycemia in the past month, which was in the hospital when she was admitted for pancreatitis.  Reports 1 symptomatic episode of possible low blood sugar "at church" which resolved with a piece candy.   Patient reports nocturia, 3-4 times per night. Sometimes only once but rarely Reports that she have neuropathy in both legs Reports difficulty seeing out of her right eye - blurriness and rash on skin next to eye  Diet: patient reports eating little due to stress. She only eats when she is hungry. If she only has one meal during the day, it is typically dinner. She reports that she feels "anxious" when she eats and that makes her nauseated. She last vomited in the hospital 3 weeks ago.   Exercise: patient reports use of a stationary bicycle, almost daily.    O:  . Lab Results  Component Value Date   HGBA1C 10.8* 11/14/2013     Home fasting CBG readings of 200s-300s Sparing 2 hours after meals, BG "325"  A/P: Longstanding diabetes currently uncontrolled. Denies hypoglycemic events since last admission and is able to verbalize appropriate hypoglycemia management plan.  Reports adherence with medication. Control is suboptimal due to stress.  Increased dose of basal insulin Lantus (insulin glargine) to 40 units twice daily. Increased dose of rapid insulin Novolog (insulin aspart) to 20 units three times daily with meals. Educated patient to only take Novolog when she eats a meal. Next A1C anticipated in October.  Written patient instructions  provided.  New blood glucose  meter "Relion" meter, strips and lancets sent at patient request to [facility].  HTN uncontrolled with BP of 190/70mmHg and 160/83mmHg upon manual measurement. Changed metoprolol to carvedilol 25mg  twice daily for BP control and additional HR lowering effects. Patient verbalized understanding of changes.  F/U with Dr. Foye Clock in 1 week.   Follow up in Pharmacist Clinic Visit in October.   Total time in face to face counseling 45 minutes.  Patient seen with Gloriajean Dell, PharmD Candidate;  Fuller Canada,  PharmD Resident, and Nicoletta Ba, PharmD resident.

## 2014-01-06 NOTE — Assessment & Plan Note (Signed)
HTN uncontrolled with BP of 190/65mmHg and 160/53mmHg upon manual measurement. Changed metoprolol to carvedilol 25mg  twice daily for BP control and additional HR lowering effects. Patient verbalized understanding of changes.  F/U with Dr. Foye Clock in 1 week.

## 2014-01-06 NOTE — Patient Instructions (Addendum)
Increasing Novolog to 20 units three times daily with meals Do not use Novolog if not eating Increasing Lantus to 40 units twice daily Changing metoprolol to carvedilol 25mg  twice daily Sending orders for a new blood glucose monitor with supplies Try to get readings at various times of the day including before and after meals Please bring meter or a record of blood sugars to next visit Follow up with Dr. Wendi Snipes next week Follow up  with Rx clinic in October

## 2014-01-06 NOTE — Assessment & Plan Note (Signed)
Longstanding diabetes currently uncontrolled. Denies hypoglycemic events since last admission and is able to verbalize appropriate hypoglycemia management plan.  Reports adherence with medication. Control is suboptimal due to stress.  Increased dose of basal insulin Lantus (insulin glargine) to 40 units twice daily. Increased dose of rapid insulin Novolog (insulin aspart) to 20 units three times daily with meals. Educated patient to only take Novolog when she eats a meal. Next A1C anticipated in October.  Written patient instructions provided.

## 2014-01-10 NOTE — Progress Notes (Signed)
Patient ID: [name] [name], female   DOB: [birth year], 63 y.o.   MRN: [id number] Reviewed: Agree with Dr. Graylin Shiver documentation and management.

## 2014-01-11 ENCOUNTER — Ambulatory Visit: Payer: Commercial Managed Care - HMO | Admitting: Family Medicine

## 2014-01-18 ENCOUNTER — Ambulatory Visit: Payer: Commercial Managed Care - HMO | Admitting: Family Medicine

## 2014-01-27 ENCOUNTER — Ambulatory Visit (INDEPENDENT_AMBULATORY_CARE_PROVIDER_SITE_OTHER): Payer: Commercial Managed Care - HMO | Admitting: Family Medicine

## 2014-01-27 ENCOUNTER — Encounter: Payer: Self-pay | Admitting: Family Medicine

## 2014-01-27 VITALS — BP 186/74 | HR 105 | Temp 99.2°F | Ht 63.0 in | Wt 226.9 lb

## 2014-01-27 DIAGNOSIS — G8929 Other chronic pain: Secondary | ICD-10-CM

## 2014-01-27 DIAGNOSIS — M542 Cervicalgia: Secondary | ICD-10-CM

## 2014-01-27 DIAGNOSIS — G629 Polyneuropathy, unspecified: Secondary | ICD-10-CM

## 2014-01-27 DIAGNOSIS — I10 Essential (primary) hypertension: Secondary | ICD-10-CM

## 2014-01-27 DIAGNOSIS — B372 Candidiasis of skin and nail: Secondary | ICD-10-CM

## 2014-01-27 DIAGNOSIS — L600 Ingrowing nail: Secondary | ICD-10-CM

## 2014-01-27 MED ORDER — ESOMEPRAZOLE MAGNESIUM 40 MG PO CPDR
40.0000 mg | DELAYED_RELEASE_CAPSULE | Freq: Every day | ORAL | Status: DC
Start: 2014-01-27 — End: 2014-01-30

## 2014-01-27 MED ORDER — CLOBETASOL PROPIONATE 0.05 % EX OINT: 1.0000 "application " | TOPICAL_OINTMENT | Freq: Two times a day (BID) | CUTANEOUS | Status: DC

## 2014-01-27 MED ORDER — SPIRONOLACTONE 25 MG PO TABS: 25.0000 mg | ORAL_TABLET | Freq: Two times a day (BID) | ORAL | Status: DC

## 2014-01-27 MED ORDER — NYSTATIN 100000 UNIT/GM EX POWD: Freq: Three times a day (TID) | CUTANEOUS | Status: DC

## 2014-01-27 MED ORDER — LISINOPRIL 40 MG PO TABS: 40.0000 mg | ORAL_TABLET | Freq: Every day | ORAL | Status: DC

## 2014-01-27 MED ORDER — KETOROLAC TROMETHAMINE 60 MG/2ML IM SOLN
60.0000 mg | Freq: Once | INTRAMUSCULAR | Status: AC
  Administered 2014-01-27: 60 mg via INTRAMUSCULAR

## 2014-01-27 NOTE — Assessment & Plan Note (Signed)
Chronic intermittent neck pain with MRI below No assymetric weakness and sensation intact despite parastesias Toradol today, recommended tylenol PRN at home Has naproxen as well Refer to pain management  05/15/2012 - MR cervical spine IMPRESSION:  1. Cervical spondylosis, congenitally short pedicles, and  degenerative disc disease, causing prominent impingement at C5-6  and C6-7, and mild impingement at C3-4 and C4-5 as detailed above.

## 2014-01-27 NOTE — Assessment & Plan Note (Signed)
Refilled nystatin

## 2014-01-27 NOTE — Assessment & Plan Note (Signed)
Changed to coreg, still uncontrolled With historicaly low normal K will start spironolactone BMP today, 3 days, 1 week, 1 month- she is agreeable Follow up 1 month Continue Ace, HCTZ, and BB No red flags currently, discussed red flags and reasons to seek emergency care

## 2014-01-27 NOTE — Patient Instructions (Signed)
Great to see you today!   I will write the referral to the pain clinic and the nurses will help you arrange it  Please make a lab appt to get labs in 3 days and then in 1 week   Please come back in 1 month

## 2014-01-27 NOTE — Assessment & Plan Note (Signed)
lyrica not helping neck pain Continue for other diabetic related neuropathy

## 2014-01-27 NOTE — Progress Notes (Signed)
Patient ID: [name] [name], female   DOB: Aug 19, 1950, 63 y.o.   MRN: [id number]   HPI  Patient presents today for f/u HTN, neck pain  Neck pain States that it's been bothering her intermittently for years today she complains of right-sided neck pain radiating down to her hand diffusely She denies any problems with her grip strength She also describes paresthesias across her right hand diffusely, in each dermatome Barely responds to Tylenol She requests referral to pain management and a shot of Toradol  Hypertension No chest pain, dyspnea, palpitations, or current headache Sometimes has severe headaches and "being unable to see "with uncontrolled.hypertension, I discussed that these would indicate hypertensive emergency and told her that she needs to go the ER if these occur. She is taking her medications regularly and can describe them without any list She's taking Coreg She is agreeable to the frequent lab draws for starting spirono  ingrown nail on left thumb, she is doing well status post removal of nail of her right thumb and states that she's not quite ready to get her left thumb nail taken off. States she uses clobetasol on her fingertips and requests a refill  Smoking status noted ROS: Per HPI  Objective: BP 186/74  Pulse 105  Temp(Src) 99.2 F (37.3 C) (Oral)  Ht 5\' 3"  (1.6 m)  Wt 226 lb 14.4 oz (102.921 kg)  BMI 40.20 kg/m2 Gen: NAD, alert, cooperative with exam HEENT: NCAT CV: RRR, good S1/S2, no murmur Resp: CTABL, no wheezes, non-labored Abd: SNTND, BS present, no guarding or organomegaly Ext: Trace pitting edema bilateral lower extremities Neuro: Alert and oriented, strength 4/5 bilateral upper extremities, sensation intact across C5 to C8 on right upper extremity  Assessment and plan:  Neck pain of over 3 months duration Chronic intermittent neck pain with MRI below No assymetric weakness and sensation intact despite parastesias Toradol today, recommended  tylenol PRN at home Has naproxen as well Refer to pain management  05/15/2012 - MR cervical spine IMPRESSION:  1. Cervical spondylosis, congenitally short pedicles, and  degenerative disc disease, causing prominent impingement at C5-6  and C6-7, and mild impingement at C3-4 and C4-5 as detailed above.   Neuropathy lyrica not helping neck pain Continue for other diabetic related neuropathy  Ingrown nail Pt with ingrown nail on L thumb Not ready for removal S/p removal of R and doing well   HYPERTENSION, BENIGN ESSENTIAL Changed to coreg, still uncontrolled With historicaly low normal K will start spironolactone BMP today, 3 days, 1 week, 1 month- she is agreeable Follow up 1 month Continue Ace, HCTZ, and BB No red flags currently, discussed red flags and reasons to seek emergency care  Candidal intertrigo Refilled nystatin   Orders Placed This Encounter  Procedures  . Basic Metabolic Panel  . Basic Metabolic Panel    Standing Status: Future     Number of Occurrences:      Standing Expiration Date: 02/05/2015  . Basic Metabolic Panel    Standing Status: Future     Number of Occurrences:      Standing Expiration Date: 01/28/2015  . Ambulatory referral to Pain Clinic    Referral Priority:  Routine    Referral Type:  Consultation    Referral Reason:  Specialty Services Required    Requested Specialty:  Pain Medicine    Number of Visits Requested:  1    Meds ordered this encounter  Medications  . nystatin (MYCOSTATIN) powder    Sig: Apply topically 3 (three)  times daily. To affected area    Dispense:  30 g    Refill:  3  . lisinopril (PRINIVIL,ZESTRIL) 40 MG tablet    Sig: Take 1 tablet (40 mg total) by mouth daily.    Dispense:  30 tablet    Refill:  11  . esomeprazole (NEXIUM) 40 MG capsule    Sig: Take 1 capsule (40 mg total) by mouth daily before breakfast.    Dispense:  90 capsule    Refill:  3  . clobetasol ointment (TEMOVATE) 0.05 %    Sig: Apply 1  application topically 2 (two) times daily. Apply under nails    Dispense:  30 g    Refill:  0  . spironolactone (ALDACTONE) 25 MG tablet    Sig: Take 1 tablet (25 mg total) by mouth 2 (two) times daily.    Dispense:  60 tablet    Refill:  3  . ketorolac (TORADOL) injection 60 mg    Sig:

## 2014-01-27 NOTE — Assessment & Plan Note (Addendum)
Pt with ingrown nail on L thumb Not ready for removal S/p removal of R and doing well

## 2014-01-27 NOTE — Addendum Note (Signed)
Addended by: Lianne Bushy on: 01/27/2014 10:07 AM   Modules accepted: Orders

## 2014-01-30 ENCOUNTER — Telehealth: Payer: Self-pay | Admitting: Family Medicine

## 2014-01-30 ENCOUNTER — Other Ambulatory Visit: Payer: Commercial Managed Care - HMO

## 2014-01-30 DIAGNOSIS — I10 Essential (primary) hypertension: Secondary | ICD-10-CM

## 2014-01-30 MED ORDER — NYSTATIN 100000 UNIT/GM EX POWD: Freq: Three times a day (TID) | CUTANEOUS | Status: DC

## 2014-01-30 MED ORDER — ESOMEPRAZOLE MAGNESIUM 40 MG PO CPDR
40.0000 mg | DELAYED_RELEASE_CAPSULE | Freq: Every day | ORAL | Status: DC
Start: 2014-01-30 — End: 2014-04-07

## 2014-01-30 MED ORDER — LISINOPRIL 40 MG PO TABS: 40.0000 mg | ORAL_TABLET | Freq: Every day | ORAL | Status: DC

## 2014-01-30 MED ORDER — SPIRONOLACTONE 25 MG PO TABS: 25.0000 mg | ORAL_TABLET | Freq: Two times a day (BID) | ORAL | Status: DC

## 2014-01-30 MED ORDER — CLOBETASOL PROPIONATE 0.05 % EX OINT: 1.0000 "application " | TOPICAL_OINTMENT | Freq: Two times a day (BID) | CUTANEOUS | Status: DC

## 2014-01-30 NOTE — Telephone Encounter (Signed)
Covering for Dr. Wendi Snipes. Chart reviewed and Rx's sent to [facility]. Thanks. --CMS

## 2014-01-30 NOTE — Telephone Encounter (Signed)
Pt referred to them for pain management, they are calling to let us know they do not participate in [facility].

## 2014-01-30 NOTE — Telephone Encounter (Signed)
Pt called and all her medications where sent to the wrong pharmacy. Temovate, lisinopril, Aldactone, and Nexium needs to be sent to [facility] on [name]. Please call patient when done. jw

## 2014-01-30 NOTE — Telephone Encounter (Signed)
Left message on patient voicemail that [facility] does not accept [employer] and we now sent her referral to [facility]. And will contact her when we get an appointment.

## 2014-02-03 ENCOUNTER — Other Ambulatory Visit: Payer: Self-pay | Admitting: *Deleted

## 2014-02-03 ENCOUNTER — Other Ambulatory Visit: Payer: Commercial Managed Care - HMO

## 2014-02-03 MED ORDER — FUROSEMIDE 20 MG PO TABS: 20.0000 mg | ORAL_TABLET | Freq: Every day | ORAL | Status: DC | PRN

## 2014-02-03 NOTE — Telephone Encounter (Signed)
Covering for Dr. Wendi Snipes. Refilled Lasix. --CMS

## 2014-02-07 ENCOUNTER — Other Ambulatory Visit: Payer: Commercial Managed Care - HMO

## 2014-02-07 DIAGNOSIS — I10 Essential (primary) hypertension: Secondary | ICD-10-CM

## 2014-02-07 LAB — BASIC METABOLIC PANEL
BUN: 11 mg/dL (ref 6–23)
CO2: 29 mEq/L (ref 19–32)
Calcium: 9 mg/dL (ref 8.4–10.5)
Chloride: 96 mEq/L (ref 96–112)
Creat: 0.78 mg/dL (ref 0.50–1.10)
Glucose, Bld: 219 mg/dL — ABNORMAL HIGH (ref 70–99)
Potassium: 3.4 mEq/L — ABNORMAL LOW (ref 3.5–5.3)
Sodium: 137 mEq/L (ref 135–145)

## 2014-02-07 NOTE — Progress Notes (Signed)
BMP DONE TODAY Pooja Camuso 

## 2014-02-08 ENCOUNTER — Encounter: Payer: Self-pay | Admitting: *Deleted

## 2014-02-09 ENCOUNTER — Encounter: Payer: Self-pay | Admitting: Family Medicine

## 2014-03-08 ENCOUNTER — Encounter: Payer: Self-pay | Admitting: Internal Medicine

## 2014-03-14 ENCOUNTER — Other Ambulatory Visit: Payer: Self-pay | Admitting: *Deleted

## 2014-03-14 DIAGNOSIS — G894 Chronic pain syndrome: Secondary | ICD-10-CM

## 2014-03-15 MED ORDER — PREGABALIN 150 MG PO CAPS: 150.0000 mg | ORAL_CAPSULE | Freq: Three times a day (TID) | ORAL | Status: DC

## 2014-03-15 NOTE — Telephone Encounter (Signed)
Refilled lyrica.   Laroy Apple, MD [place] Resident, PGY-3 03/15/2014, 1:53 PM

## 2014-03-21 NOTE — Telephone Encounter (Signed)
Left message that rx was ready to be picked up.

## 2014-03-29 ENCOUNTER — Encounter (HOSPITAL_COMMUNITY): Payer: Self-pay

## 2014-03-29 ENCOUNTER — Emergency Department (HOSPITAL_COMMUNITY)
Admission: EM | Admit: 2014-03-29 | Discharge: 2014-03-29 | Disposition: A | Discharge status: Home / Self Care | Payer: Commercial Managed Care - HMO | Attending: Emergency Medicine | Admitting: Emergency Medicine

## 2014-03-29 ENCOUNTER — Emergency Department (HOSPITAL_COMMUNITY): Payer: Commercial Managed Care - HMO

## 2014-03-29 DIAGNOSIS — Z87448 Personal history of other diseases of urinary system: Secondary | ICD-10-CM | POA: Diagnosis not present

## 2014-03-29 DIAGNOSIS — E119 Type 2 diabetes mellitus without complications: Secondary | ICD-10-CM | POA: Insufficient documentation

## 2014-03-29 DIAGNOSIS — R011 Cardiac murmur, unspecified: Secondary | ICD-10-CM | POA: Diagnosis not present

## 2014-03-29 DIAGNOSIS — R1084 Generalized abdominal pain: Secondary | ICD-10-CM | POA: Diagnosis present

## 2014-03-29 DIAGNOSIS — K219 Gastro-esophageal reflux disease without esophagitis: Secondary | ICD-10-CM | POA: Diagnosis not present

## 2014-03-29 DIAGNOSIS — Z7982 Long term (current) use of aspirin: Secondary | ICD-10-CM | POA: Insufficient documentation

## 2014-03-29 DIAGNOSIS — J45909 Unspecified asthma, uncomplicated: Secondary | ICD-10-CM | POA: Insufficient documentation

## 2014-03-29 DIAGNOSIS — Z79899 Other long term (current) drug therapy: Secondary | ICD-10-CM | POA: Insufficient documentation

## 2014-03-29 DIAGNOSIS — Z8673 Personal history of transient ischemic attack (TIA), and cerebral infarction without residual deficits: Secondary | ICD-10-CM | POA: Diagnosis not present

## 2014-03-29 DIAGNOSIS — H9192 Unspecified hearing loss, left ear: Secondary | ICD-10-CM | POA: Insufficient documentation

## 2014-03-29 DIAGNOSIS — K861 Other chronic pancreatitis: Secondary | ICD-10-CM | POA: Insufficient documentation

## 2014-03-29 DIAGNOSIS — Z91040 Latex allergy status: Secondary | ICD-10-CM | POA: Diagnosis not present

## 2014-03-29 DIAGNOSIS — M1990 Unspecified osteoarthritis, unspecified site: Secondary | ICD-10-CM | POA: Diagnosis not present

## 2014-03-29 DIAGNOSIS — Z8701 Personal history of pneumonia (recurrent): Secondary | ICD-10-CM | POA: Insufficient documentation

## 2014-03-29 DIAGNOSIS — G894 Chronic pain syndrome: Secondary | ICD-10-CM | POA: Insufficient documentation

## 2014-03-29 DIAGNOSIS — Z794 Long term (current) use of insulin: Secondary | ICD-10-CM | POA: Diagnosis not present

## 2014-03-29 DIAGNOSIS — Z872 Personal history of diseases of the skin and subcutaneous tissue: Secondary | ICD-10-CM | POA: Insufficient documentation

## 2014-03-29 DIAGNOSIS — Z720 Tobacco use: Secondary | ICD-10-CM | POA: Insufficient documentation

## 2014-03-29 DIAGNOSIS — Z9841 Cataract extraction status, right eye: Secondary | ICD-10-CM | POA: Diagnosis not present

## 2014-03-29 DIAGNOSIS — Z7951 Long term (current) use of inhaled steroids: Secondary | ICD-10-CM | POA: Insufficient documentation

## 2014-03-29 DIAGNOSIS — Z9842 Cataract extraction status, left eye: Secondary | ICD-10-CM | POA: Insufficient documentation

## 2014-03-29 DIAGNOSIS — Z862 Personal history of diseases of the blood and blood-forming organs and certain disorders involving the immune mechanism: Secondary | ICD-10-CM | POA: Insufficient documentation

## 2014-03-29 DIAGNOSIS — Z791 Long term (current) use of non-steroidal anti-inflammatories (NSAID): Secondary | ICD-10-CM | POA: Diagnosis not present

## 2014-03-29 DIAGNOSIS — I10 Essential (primary) hypertension: Secondary | ICD-10-CM | POA: Diagnosis not present

## 2014-03-29 DIAGNOSIS — R109 Unspecified abdominal pain: Secondary | ICD-10-CM

## 2014-03-29 LAB — COMPREHENSIVE METABOLIC PANEL
ALT: 16 U/L (ref 0–35)
AST: 18 U/L (ref 0–37)
Albumin: 3.5 g/dL (ref 3.5–5.2)
Alkaline Phosphatase: 83 U/L (ref 39–117)
Anion gap: 15 (ref 5–15)
BUN: 8 mg/dL (ref 6–23)
CO2: 25 mEq/L (ref 19–32)
Calcium: 8.9 mg/dL (ref 8.4–10.5)
Chloride: 103 mEq/L (ref 96–112)
Creatinine, Ser: 0.72 mg/dL (ref 0.50–1.10)
GFR calc Af Amer: >90 mL/min (ref >90)
GFR calc non Af Amer: 88 mL/min — ABNORMAL LOW (ref >90)
Glucose, Bld: 178 mg/dL — ABNORMAL HIGH (ref 70–99)
Potassium: 3.6 mEq/L — ABNORMAL LOW (ref 3.7–5.3)
Sodium: 143 mEq/L (ref 137–147)
Total Bilirubin: 0.2 mg/dL — ABNORMAL LOW (ref 0.3–1.2)
Total Protein: 7.6 g/dL (ref 6.0–8.3)

## 2014-03-29 LAB — CBC WITH DIFFERENTIAL/PLATELET
Basophils Absolute: 0 10*3/uL (ref 0.0–0.1)
Basophils Relative: 1 % (ref 0–1)
Eosinophils Absolute: 0.2 10*3/uL (ref 0.0–0.7)
Eosinophils Relative: 2 % (ref 0–5)
HCT: 37.2 % (ref 36.0–46.0)
Hemoglobin: 12.4 g/dL (ref 12.0–15.0)
Lymphocytes Relative: 37 % (ref 12–46)
Lymphs Abs: 2.9 10*3/uL (ref 0.7–4.0)
MCH: 27.4 pg (ref 26.0–34.0)
MCHC: 33.3 g/dL (ref 30.0–36.0)
MCV: 82.3 fL (ref 78.0–100.0)
Monocytes Absolute: 0.4 10*3/uL (ref 0.1–1.0)
Monocytes Relative: 5 % (ref 3–12)
Neutro Abs: 4.3 10*3/uL (ref 1.7–7.7)
Neutrophils Relative %: 55 % (ref 43–77)
Platelets: 144 10*3/uL — ABNORMAL LOW (ref 150–400)
RBC: 4.52 MIL/uL (ref 3.87–5.11)
RDW: 15 % (ref 11.5–15.5)
WBC: 7.7 10*3/uL (ref 4.0–10.5)

## 2014-03-29 LAB — LIPASE, BLOOD: Lipase: 895 U/L — ABNORMAL HIGH (ref 11–59)

## 2014-03-29 MED ORDER — MORPHINE SULFATE 4 MG/ML IJ SOLN
4.0000 mg | Freq: Once | INTRAMUSCULAR | Status: AC
  Administered 2014-03-29: 4 mg via INTRAVENOUS
  Filled 2014-03-29: qty 1

## 2014-03-29 MED ORDER — ONDANSETRON HCL 4 MG/2ML IJ SOLN
4.0000 mg | Freq: Once | INTRAMUSCULAR | Status: AC
  Administered 2014-03-29: 4 mg via INTRAVENOUS
  Filled 2014-03-29: qty 2

## 2014-03-29 MED ORDER — ONDANSETRON 4 MG PO TBDP: ORAL_TABLET | ORAL | Status: DC

## 2014-03-29 MED ORDER — GI COCKTAIL ~~LOC~~
30.0000 mL | Freq: Once | ORAL | Status: AC
  Administered 2014-03-29: 30 mL via ORAL
  Filled 2014-03-29: qty 30

## 2014-03-29 MED ORDER — OXYCODONE-ACETAMINOPHEN 5-325 MG PO TABS: 1.0000 | ORAL_TABLET | ORAL | Status: DC | PRN

## 2014-03-29 MED ORDER — SODIUM CHLORIDE 0.9 % IV BOLUS (SEPSIS)
1000.0000 mL | Freq: Once | INTRAVENOUS | Status: AC
Start: 2014-03-29 — End: 2014-03-29
  Administered 2014-03-29: 1000 mL via INTRAVENOUS

## 2014-03-29 NOTE — ED Notes (Signed)
Pt reports epigastric pain and abd distention. Pt states she was unable to put on a bra due to her abd swelling

## 2014-03-29 NOTE — Discharge Instructions (Signed)

## 2014-03-29 NOTE — ED Notes (Signed)
Pt presents with pain and swelling  to her pancreas, pain to her esophagus, upper abd pain, tightness and swelling, nausea, and diarrhea x3 weeks.
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
## 2014-03-29 NOTE — ED Notes (Signed)
Pt a/o x 4 on d/c in wheelchair escorted by family.

## 2014-04-05 ENCOUNTER — Ambulatory Visit (INDEPENDENT_AMBULATORY_CARE_PROVIDER_SITE_OTHER): Payer: Commercial Managed Care - HMO | Admitting: Family Medicine

## 2014-04-05 ENCOUNTER — Ambulatory Visit (HOSPITAL_COMMUNITY)
Admission: RE | Admit: 2014-04-05 | Discharge: 2014-04-05 | Disposition: A | Discharge status: Home / Self Care | Payer: Commercial Managed Care - HMO | Source: Ambulatory Visit | Attending: Family Medicine | Admitting: Family Medicine

## 2014-04-05 ENCOUNTER — Encounter: Payer: Self-pay | Admitting: Family Medicine

## 2014-04-05 VITALS — BP 200/84 | HR 92 | Temp 98.8°F | Ht 63.0 in | Wt 227.5 lb

## 2014-04-05 DIAGNOSIS — R079 Chest pain, unspecified: Secondary | ICD-10-CM | POA: Diagnosis not present

## 2014-04-05 DIAGNOSIS — R0789 Other chest pain: Secondary | ICD-10-CM

## 2014-04-05 DIAGNOSIS — I10 Essential (primary) hypertension: Secondary | ICD-10-CM

## 2014-04-05 DIAGNOSIS — K8500 Idiopathic acute pancreatitis without necrosis or infection: Secondary | ICD-10-CM

## 2014-04-05 LAB — TROPONIN I: Troponin I: <0.3 ng/mL (ref <0.30)

## 2014-04-05 LAB — CBC WITH DIFFERENTIAL/PLATELET
Basophils Absolute: 0.1 10*3/uL (ref 0.0–0.1)
Basophils Relative: 1 % (ref 0–1)
Eosinophils Absolute: 0.1 10*3/uL (ref 0.0–0.7)
Eosinophils Relative: 2 % (ref 0–5)
HCT: 37.1 % (ref 36.0–46.0)
Hemoglobin: 12.1 g/dL (ref 12.0–15.0)
Lymphocytes Relative: 34 % (ref 12–46)
Lymphs Abs: 2.1 10*3/uL (ref 0.7–4.0)
MCH: 27.2 pg (ref 26.0–34.0)
MCHC: 32.6 g/dL (ref 30.0–36.0)
MCV: 83.4 fL (ref 78.0–100.0)
Monocytes Absolute: 0.3 10*3/uL (ref 0.1–1.0)
Monocytes Relative: 5 % (ref 3–12)
Neutro Abs: 3.6 10*3/uL (ref 1.7–7.7)
Neutrophils Relative %: 58 % (ref 43–77)
Platelets: 208 10*3/uL (ref 150–400)
RBC: 4.45 MIL/uL (ref 3.87–5.11)
RDW: 15.3 % (ref 11.5–15.5)
WBC: 6.2 10*3/uL (ref 4.0–10.5)

## 2014-04-05 LAB — COMPREHENSIVE METABOLIC PANEL
ALT: 15 U/L (ref 0–35)
AST: 17 U/L (ref 0–37)
Albumin: 3.7 g/dL (ref 3.5–5.2)
Alkaline Phosphatase: 91 U/L (ref 39–117)
BUN: 9 mg/dL (ref 6–23)
CO2: 29 mEq/L (ref 19–32)
Calcium: 9 mg/dL (ref 8.4–10.5)
Chloride: 100 mEq/L (ref 96–112)
Creat: 0.75 mg/dL (ref 0.50–1.10)
Glucose, Bld: 157 mg/dL — ABNORMAL HIGH (ref 70–99)
Potassium: 3.7 mEq/L (ref 3.5–5.3)
Sodium: 140 mEq/L (ref 135–145)
Total Bilirubin: 0.3 mg/dL (ref 0.3–1.2)
Total Protein: 7.5 g/dL (ref 6.0–8.3)

## 2014-04-05 MED ORDER — SPIRONOLACTONE 50 MG PO TABS: 50.0000 mg | ORAL_TABLET | Freq: Two times a day (BID) | ORAL | Status: DC

## 2014-04-05 MED ORDER — MORPHINE SULFATE 10 MG/ML IJ SOLN
2.0000 mg | Freq: Once | INTRAMUSCULAR | Status: AC
  Administered 2014-04-05: 2 mg via INTRAMUSCULAR

## 2014-04-05 MED ORDER — OXYCODONE-ACETAMINOPHEN 5-325 MG PO TABS: 1.0000 | ORAL_TABLET | ORAL | Status: DC | PRN

## 2014-04-05 NOTE — Assessment & Plan Note (Addendum)
Atypical chest pain for 3 weeks Troponin X 1 EKG today nonspecific changes not much different from EKG in the ED 4 days ago. The only significant differences T wave inversion in V3 in addition to V4 through V6 EKG 04/05/2014: With normal sinus rhythm, T wave inversions in V4, V5, V6, V3 Follow-up 2 days

## 2014-04-05 NOTE — Assessment & Plan Note (Signed)
Elevated today, likely due to pain but BP uncintrolled at baseline Continues to be hypoK, Possible primary hypoaldo Increase spirono to 50 BID and re-check K in 2 days.

## 2014-04-05 NOTE — Patient Instructions (Signed)
Great to see you  Please seek emergency help if your pain medicine is not helping or if you cant tolerate fluids by mouth.  Please follow up Friday.  Chest Pain (Nonspecific) It is often hard to give a specific diagnosis for the cause of chest pain. There is always a chance that your pain could be related to something serious, such as a heart attack or a blood clot in the lungs. You need to follow up with your health care provider for further evaluation. CAUSES   Heartburn.  Pneumonia or bronchitis.  Anxiety or stress.  Inflammation around your heart (pericarditis) or lung (pleuritis or pleurisy).  A blood clot in the lung.  A collapsed lung (pneumothorax). It can develop suddenly on its own (spontaneous pneumothorax) or from trauma to the chest.  Shingles infection (herpes zoster virus). The chest wall is composed of bones, muscles, and cartilage. Any of these can be the source of the pain.  The bones can be bruised by injury.  The muscles or cartilage can be strained by coughing or overwork.  The cartilage can be affected by inflammation and become sore (costochondritis). DIAGNOSIS  Lab tests or other studies may be needed to find the cause of your pain. Your health care provider may have you take a test called an ambulatory electrocardiogram (ECG). An ECG records your heartbeat patterns over a 24-hour period. You may also have other tests, such as:  Transthoracic echocardiogram (TTE). During echocardiography, sound waves are used to evaluate how blood flows through your heart.  Transesophageal echocardiogram (TEE).  Cardiac monitoring. This allows your health care provider to monitor your heart rate and rhythm in real time.  Holter monitor. This is a portable device that records your heartbeat and can help diagnose heart arrhythmias. It allows your health care provider to track your heart activity for several days, if needed.  Stress tests by exercise or by giving medicine  that makes the heart beat faster. TREATMENT   Treatment depends on what may be causing your chest pain. Treatment may include:  Acid blockers for heartburn.  Anti-inflammatory medicine.  Pain medicine for inflammatory conditions.  Antibiotics if an infection is present.  You may be advised to change lifestyle habits. This includes stopping smoking and avoiding alcohol, caffeine, and chocolate.  You may be advised to keep your head raised (elevated) when sleeping. This reduces the chance of acid going backward from your stomach into your esophagus. Most of the time, nonspecific chest pain will improve within 2-3 days with rest and mild pain medicine.  HOME CARE INSTRUCTIONS   If antibiotics were prescribed, take them as directed. Finish them even if you start to feel better.  For the next few days, avoid physical activities that bring on chest pain. Continue physical activities as directed.  Do not use any tobacco products, including cigarettes, chewing tobacco, or electronic cigarettes.  Avoid drinking alcohol.  Only take medicine as directed by your health care provider.  Follow your health care provider's suggestions for further testing if your chest pain does not go away.  Keep any follow-up appointments you made. If you do not go to an appointment, you could develop lasting (chronic) problems with pain. If there is any problem keeping an appointment, call to reschedule. SEEK MEDICAL CARE IF:   Your chest pain does not go away, even after treatment.  You have a rash with blisters on your chest.  You have a fever. SEEK IMMEDIATE MEDICAL CARE IF:   You have  increased chest pain or pain that spreads to your arm, neck, jaw, back, or abdomen.  You have shortness of breath.  You have an increasing cough, or you cough up blood.  You have severe back or abdominal pain.  You feel nauseous or vomit.  You have severe weakness.  You faint.  You have chills. This is an  emergency. Do not wait to see if the pain will go away. Get medical help at once. Call your local emergency services (911 in [place] Do not drive yourself to the hospital. MAKE SURE YOU:   Understand these instructions.  Will watch your condition.  Will get help right away if you are not doing well or get worse. Document Released: 01/15/2005 Document Revised: 04/12/2013 Document Reviewed: 11/11/2007 [facility] Patient Information 2015 [place], [place]. This information is not intended to replace advice given to you by your health care provider. Make sure you discuss any questions you have with your health care provider.

## 2014-04-05 NOTE — Assessment & Plan Note (Signed)
Improving Check labs Encourage fluids but be careful with solids #45, 5 mg percocet given, - expectation to last 1 week Follow up in 2 days

## 2014-04-05 NOTE — Progress Notes (Signed)
Patient ID: [name] [name], female   DOB: 1951-03-04, 63 y.o.   MRN: [id number]   HPI  Patient presents today for ER follow-up  Patient states she went to the ER with abdominal pain is diagnosed with pancreatitis. She is discharged with oral pain medicine and Zofran. The pain medicines were managing her pain she's run out now. She states that her pain overall has improved even without the pain medicine but she still needs some.  Her pain increases with eating however she is tolerating fluids without nausea or vomiting. She denies any fevers, chills, sweats.  She does have some chest pain she states which is her typical chest pain that is recurrent with pancreatitis, she describes left-sided chest pain stabbing in nature without radiation and without aggravating or alleviating factor. She states that he gets worse whenever she has pancreatitis. She also notes right-sided neck pain but this is not a chest pain radiated to her neck. She has worsening in several situations including rest and activity with improvement with rest or activity.  He states that she can follow-up in 2 days and that she will follow-up in the ER if her pain worsens, is not controlled by oral medicines, or she is not able to tolerate fluids.   Smoking status noted ROS: Per HPI  Objective: BP 200/84 mmHg  Pulse 92  Temp(Src) 98.8 F (37.1 C) (Oral)  Ht 5\' 3"  (1.6 m)  Wt 227 lb 8 oz (103.193 kg)  BMI 40.31 kg/m2 Gen: NAD, alert, cooperative with exam HEENT: NCAT,  CV: RRR, good S1/S2, no murmur Resp: CTABL, no wheezes, non-laborpositive BS, tenderness to palpation of epigastric area, no rebound Ext: No edema, warm Neuro: Alert and oriented, No gross deficits  Assessment and plan:  Chest pain Atypical chest pain for 3 weeks Troponin X 1 EKG today nonspecific changes not much different from EKG in the ED 4 days ago. The only significant differences T wave inversion in V3 in addition to V4 through V6 EKG  04/05/2014: With normal sinus rhythm, T wave inversions in V4, V5, V6, V3 Follow-up 2 days  Acute pancreatitis Improving Check labs Encourage fluids but be careful with solids #45, 5 mg percocet given, - expectation to last 1 week Follow up in 2 days  HYPERTENSION, BENIGN ESSENTIAL Elevated today, likely due to pain but BP uncintrolled at baseline Continues to be hypoK, Possible primary hypoaldo Increase spirono to 50 BID and re-check K in 2 days.     Orders Placed This Encounter  Procedures  . Comprehensive metabolic panel  . CBC with Differential  . Troponin I  . EKG 12-Lead    Meds ordered this encounter  Medications  . oxyCODONE-acetaminophen (PERCOCET/ROXICET) 5-325 MG per tablet    Sig: Take 1 tablet by mouth every 4 (four) hours as needed for moderate pain or severe pain.    Dispense:  45 tablet    Refill:  0  . spironolactone (ALDACTONE) 50 MG tablet    Sig: Take 1 tablet (50 mg total) by mouth 2 (two) times daily.    Dispense:  60 tablet    Refill:  1

## 2014-04-07 ENCOUNTER — Encounter: Payer: Self-pay | Admitting: Family Medicine

## 2014-04-07 ENCOUNTER — Ambulatory Visit (INDEPENDENT_AMBULATORY_CARE_PROVIDER_SITE_OTHER): Payer: Commercial Managed Care - HMO | Admitting: Family Medicine

## 2014-04-07 VITALS — BP 148/88 | HR 109 | Temp 99.1°F | Wt 223.0 lb

## 2014-04-07 DIAGNOSIS — I10 Essential (primary) hypertension: Secondary | ICD-10-CM

## 2014-04-07 DIAGNOSIS — K8500 Idiopathic acute pancreatitis without necrosis or infection: Secondary | ICD-10-CM

## 2014-04-07 LAB — BASIC METABOLIC PANEL
BUN: 8 mg/dL (ref 6–23)
CO2: 26 mEq/L (ref 19–32)
Calcium: 9.6 mg/dL (ref 8.4–10.5)
Chloride: 99 mEq/L (ref 96–112)
Creat: 0.79 mg/dL (ref 0.50–1.10)
Glucose, Bld: 172 mg/dL — ABNORMAL HIGH (ref 70–99)
Potassium: 3.7 mEq/L (ref 3.5–5.3)
Sodium: 139 mEq/L (ref 135–145)

## 2014-04-07 MED ORDER — ERYTHROMYCIN STEARATE 250 MG PO TABS: 250.0000 mg | ORAL_TABLET | Freq: Three times a day (TID) | ORAL | Status: DC

## 2014-04-07 MED ORDER — OMEPRAZOLE 40 MG PO CPDR: 40.0000 mg | DELAYED_RELEASE_CAPSULE | Freq: Every day | ORAL | Status: DC

## 2014-04-07 MED ORDER — METOCLOPRAMIDE HCL 10 MG PO TABS: 10.0000 mg | ORAL_TABLET | Freq: Three times a day (TID) | ORAL | Status: DC

## 2014-04-07 MED ORDER — SPIRONOLACTONE 50 MG PO TABS: 50.0000 mg | ORAL_TABLET | Freq: Two times a day (BID) | ORAL | Status: DC

## 2014-04-07 NOTE — Patient Instructions (Signed)
Great to see you!  Please follow up in 3-4 weeks or sooner if your symptoms return.   Acute Pancreatitis Acute pancreatitis is a disease in which the pancreas becomes suddenly irritated (inflamed). The pancreas is a large gland behind your stomach. The pancreas makes enzymes that help digest food. The pancreas also makes 2 hormones that help control your blood sugar. Acute pancreatitis happens when the enzymes attack and damage the pancreas. Most attacks last a couple of days and can cause serious problems. HOME CARE  Follow your doctor's diet instructions. You may need to avoid alcohol and limit fat in your diet.  Eat small meals often.  Drink enough fluids to keep your pee (urine) clear or pale yellow.  Only take medicines as told by your doctor.  Avoid drinking alcohol if it caused your disease.  Do not smoke.  Get plenty of rest.  Check your blood sugar at home as told by your doctor.  Keep all doctor visits as told. GET HELP IF:  You do not get better as quickly as expected.  You have new or worsening symptoms.  You have lasting pain, weakness, or feel sick to your stomach (nauseous).  You get better and then have another pain attack. GET HELP RIGHT AWAY IF:   You are unable to eat or keep fluids down.  Your pain becomes severe.  You have a fever or lasting symptoms for more than 2 to 3 days.  You have a fever and your symptoms suddenly get worse.  Your skin or the white part of your eyes turn yellow (jaundice).  You throw up (vomit).  You feel dizzy, or you pass out (faint).  Your blood sugar is high (over 300 mg/dL). MAKE SURE YOU:   Understand these instructions.  Will watch your condition.  Will get help right away if you are not doing well or get worse. Document Released: 09/24/2007 Document Revised: 08/22/2013 Document Reviewed: 07/17/2011 [facility] Patient Information 2015 [place], [place]. This information is not intended to replace advice given  to you by your health care provider. Make sure you discuss any questions you have with your health care provider.

## 2014-04-07 NOTE — Assessment & Plan Note (Addendum)
Still elevated but slightly improved from 2 days ago, on recheck much improved to 148/86 spironolactone increased from 25 BID-50 BID, continue to watch for effect No Red flags Check potassium again today Check potassium again in 3-4 weeks when we recheck her.

## 2014-04-07 NOTE — Assessment & Plan Note (Signed)
Resolving per history and exam Continue slow progression of food as tolerated, encouraged fluids Tolerating fluids well and does not appear dehydrated today

## 2014-04-07 NOTE — Progress Notes (Addendum)
Patient ID: [name] [name], female   DOB: 1951/01/01, 63 y.o.   MRN: [id number]   HPI  Patient presents today for follow-up of pancreatitis  Pancreatitis Has been treated for the last week or so for pancreatitis outpatient. She is getting good relief from her pain medicine but had run out at her last visit. She continues to get good pain relief from the Percocet. No alcohol abuse as an etiology for the pain medicine No clear etiology for recurrent peritonitis, discussed smoking and polypharmacy She's tolerating fluids, she had a small meal yesterday Chest pain is resolved, epigastric and left upper quadrant pain continued Her dull aching epigastric pain rating to her back has slightly improved overall even without pain medicines  Hypertension No dyspnea, chest pain, palpitations, leg edema, headaches Taking meds everyday Increased spironolactone to 50 mg twice daily, needs refill  Labs from 2 days ago overall within normal limits with elevated blood blood glucose of 157, troponin negative, EKG unchanged from previous which was also confirmed by cardiology  Smoking status noted ROS: Per HPI  Objective: BP 148/88 mmHg  Pulse 109  Temp(Src) 99.1 F (37.3 C) (Oral)  Wt 223 lb (101.152 kg) Gen: NAD, alert, cooperative with exam HEENT: NCAT, oromucosa moist CV: RRR, good S1/S2, no murmur Resp: CTABL, no wheezes, non-labored Abd: Soft, tenderness to palpation in left upper quadrant, no guarding, no rebound, positive BS Ext: No edema, warm Neuro: Alert and oriented, No gross deficits  Assessment and plan:  HYPERTENSION, BENIGN ESSENTIAL Still elevated but slightly improved from 2 days ago, on recheck much improved to 148/86 spironolactone increased from 25 BID-50 BID, continue to watch for effect No Red flags Check potassium again today Check potassium again in 3-4 weeks when we recheck her.  Acute pancreatitis Resolving per history and exam Continue slow progression of food  as tolerated, encouraged fluids Tolerating fluids well and does not appear dehydrated today    Orders Placed This Encounter  Procedures  . Basic Metabolic Panel    Meds ordered this encounter  Medications  . spironolactone (ALDACTONE) 50 MG tablet    Sig: Take 1 tablet (50 mg total) by mouth 2 (two) times daily.    Dispense:  60 tablet    Refill:  1  . erythromycin (ERYTHROCIN STEARATE) 250 MG tablet    Sig: Take 1 tablet (250 mg total) by mouth 3 (three) times daily.    Dispense:  90 tablet    Refill:  3  . omeprazole (PRILOSEC) 40 MG capsule    Sig: Take 1 capsule (40 mg total) by mouth daily.    Dispense:  30 capsule    Refill:  3  . metoCLOPramide (REGLAN) 10 MG tablet    Sig: Take 1 tablet (10 mg total) by mouth 3 (three) times daily before meals.    Dispense:  90 tablet    Refill:  3

## 2014-04-19 ENCOUNTER — Telehealth: Payer: Self-pay | Admitting: *Deleted

## 2014-04-19 NOTE — Telephone Encounter (Signed)
LVM for pt to return call to see about scheduling a nurse visit for a flu shot.[name], April D

## 2014-05-01 ENCOUNTER — Encounter: Payer: Self-pay | Admitting: Family Medicine

## 2014-05-01 ENCOUNTER — Ambulatory Visit (INDEPENDENT_AMBULATORY_CARE_PROVIDER_SITE_OTHER): Payer: Commercial Managed Care - HMO | Admitting: Family Medicine

## 2014-05-01 VITALS — BP 155/91 | HR 97 | Temp 98.1°F | Ht 63.0 in | Wt 227.0 lb

## 2014-05-01 DIAGNOSIS — Z0000 Encounter for general adult medical examination without abnormal findings: Secondary | ICD-10-CM | POA: Insufficient documentation

## 2014-05-01 DIAGNOSIS — E1165 Type 2 diabetes mellitus with hyperglycemia: Secondary | ICD-10-CM

## 2014-05-01 DIAGNOSIS — IMO0002 Reserved for concepts with insufficient information to code with codable children: Secondary | ICD-10-CM

## 2014-05-01 DIAGNOSIS — R059 Cough, unspecified: Secondary | ICD-10-CM

## 2014-05-01 DIAGNOSIS — R05 Cough: Secondary | ICD-10-CM

## 2014-05-01 LAB — POCT GLYCOSYLATED HEMOGLOBIN (HGB A1C): Hemoglobin A1C: 8.9

## 2014-05-01 MED ORDER — DOXYCYCLINE HYCLATE 100 MG PO TABS: 100.0000 mg | ORAL_TABLET | Freq: Two times a day (BID) | ORAL | Status: DC

## 2014-05-01 NOTE — Assessment & Plan Note (Signed)
Encouraged mammogram today

## 2014-05-01 NOTE — Assessment & Plan Note (Addendum)
Cough worsening over the last 2-3 weeks with flulike illness She did have improvement after the illness began and now has declined over the last 5-7 days. Will treat empirically for pneumonia given her second sickness Treat with doxycycline No apparent need for prednisone without significant wheezing on exam Follow-up one month, reviewed red flags in detail and she will return sooner if she does not improve next week.

## 2014-05-01 NOTE — Progress Notes (Signed)
Patient ID: [name] [name], female   DOB: 07/14/1950, 64 y.o.   MRN: [id number]   HPI  Patient presents today for acute illness and follow-up diabetes  Cough, congestion Patient explains the last 2 weeks she's had increased cough, congestion, itchy eyes, right ear pain, chills, malaise, muscle pains, arthralgias, and decreased appetite States the symptoms came on all of a sudden after she was at the drugstore looking for medicines. She denies overt dyspnea, she is having intermittent nosebleeds and coughing up some white sputum. No objective fevers, however does have intermittent chills night  Diabetes Watching carb intake however feels she has room to improve Not exercising daily Taking Lantus 40 units twice daily, NovoLog 15 units twice daily Has not been checking her blood sugars lately states that she has approximately one episode per week with shaking, chills, and sweating she attributes to low blood sugars- however she has not taken her blood sugar at these times.   Smoking status noted ROS: Per HPI  Objective: BP 155/91 mmHg  Pulse 97  Temp(Src) 98.1 F (36.7 C) (Oral)  Ht 5\' 3"  (1.6 m)  Wt 227 lb (102.967 kg)  BMI 40.22 kg/m2 Gen: NAD, alert, cooperative with exam HEENT: NCAT, MMM, TMs normal bilaterally, swollen nares bilaterally, oropharynx clear CV: RRR, good S1/S2, no murmur Resp: CTABL, no wheezes, non-labored - mild-to-moderate coughing throughout exam Ext: No edema, warm Neuro: Alert and oriented, No gross deficits  Assessment and plan:  Cough Cough worsening over the last 2-3 weeks with flulike illness She did have improvement after the illness began and now has declined over the last 5-7 days. Will treat empirically for pneumonia given her second sickness Treat with doxycycline No apparent need for prednisone without significant wheezing on exam Follow-up one month, reviewed red flags in detail and she will return sooner if she does not improve next  week.  DM (diabetes mellitus), type 2, uncontrolled Control improving some with A1c of 10.8-8.9 today. Continue Lantus 40 twice daily and NovoLog 15 twice daily Some hypoglycemic episodes only by symptoms and not evidenced by CBGs Discussed keeping good track of her CBGs Offered additional medications, she would like to focus on diet and exercise Follow-up next month  Healthcare maintenance Encouraged mammogram today    Orders Placed This Encounter  Procedures  . POCT HgB A1C    Meds ordered this encounter  Medications  . doxycycline (VIBRA-TABS) 100 MG tablet    Sig: Take 1 tablet (100 mg total) by mouth 2 (two) times daily.    Dispense:  20 tablet    Refill:  0

## 2014-05-01 NOTE — Assessment & Plan Note (Signed)
Control improving some with A1c of 10.8-8.9 today. Continue Lantus 40 twice daily and NovoLog 15 twice daily Some hypoglycemic episodes only by symptoms and not evidenced by CBGs Discussed keeping good track of her CBGs Offered additional medications, she would like to focus on diet and exercise Follow-up next month

## 2014-05-01 NOTE — Patient Instructions (Addendum)
Great to see you today!  I am sorry you are so sick, please come back if you do not get better by the end of the antibiotics.  Please seek medical help if we're unable to tolerate fluids by mouth, have difficulty breathing, or are concerned that your worsening too much.  Please come back for follow-up in one month  Diet Recommendations for Diabetes   Starchy (carb) foods include: Bread, rice, pasta, potatoes, corn, crackers, bagels, muffins, all baked goods.   Protein foods include: Meat, fish, poultry, eggs, dairy foods, and beans such as pinto and kidney beans (beans also provide carbohydrate).   1. Eat at least 3 meals and 1-2 snacks per day. Never go more than 4-5 hours while awake without eating.  2. Limit starchy foods to TWO per meal and ONE per snack. ONE portion of a starchy  food is equal to the following:   - ONE slice of bread (or its equivalent, such as half of a hamburger bun).   - 1/2 cup of a "scoopable" starchy food such as potatoes or rice.   - 1 OUNCE (28 grams) of starchy snack foods such as crackers or pretzels (look on label).   - 15 grams of carbohydrate as shown on food label.  3. Both lunch and dinner should include a protein food, a carb food, and vegetables.   - Obtain twice as many veg's as protein or carbohydrate foods for both lunch and dinner.   - Try to keep frozen veg's on hand for a quick vegetable serving.     - Fresh or frozen veg's are best.  4. Breakfast should always include protein.

## 2014-05-11 ENCOUNTER — Other Ambulatory Visit: Payer: Self-pay | Admitting: *Deleted

## 2014-05-11 DIAGNOSIS — G894 Chronic pain syndrome: Secondary | ICD-10-CM

## 2014-05-11 MED ORDER — PREGABALIN 150 MG PO CAPS: 150.0000 mg | ORAL_CAPSULE | Freq: Three times a day (TID) | ORAL | Status: DC

## 2014-05-11 NOTE — Telephone Encounter (Signed)
Called in Rx, initially printed then called in given incliment weather.   Laroy Apple, MD [place] Resident, PGY-3 05/11/2014, 4:06 PM

## 2014-05-13 ENCOUNTER — Other Ambulatory Visit: Payer: Self-pay | Admitting: Family Medicine

## 2014-05-23 ENCOUNTER — Telehealth: Payer: Self-pay | Admitting: Family Medicine

## 2014-05-23 DIAGNOSIS — B0052 Herpesviral keratitis: Secondary | ICD-10-CM | POA: Diagnosis not present

## 2014-05-23 DIAGNOSIS — IMO0002 Reserved for concepts with insufficient information to code with codable children: Secondary | ICD-10-CM

## 2014-05-23 DIAGNOSIS — H20011 Primary iridocyclitis, right eye: Secondary | ICD-10-CM | POA: Diagnosis not present

## 2014-05-23 DIAGNOSIS — E1165 Type 2 diabetes mellitus with hyperglycemia: Secondary | ICD-10-CM

## 2014-05-23 DIAGNOSIS — H01025 Squamous blepharitis left lower eyelid: Secondary | ICD-10-CM | POA: Diagnosis not present

## 2014-05-23 DIAGNOSIS — H0015 Chalazion left lower eyelid: Secondary | ICD-10-CM | POA: Diagnosis not present

## 2014-05-23 DIAGNOSIS — H1851 Endothelial corneal dystrophy: Secondary | ICD-10-CM | POA: Diagnosis not present

## 2014-05-23 DIAGNOSIS — H01024 Squamous blepharitis left upper eyelid: Secondary | ICD-10-CM | POA: Diagnosis not present

## 2014-05-23 DIAGNOSIS — H01021 Squamous blepharitis right upper eyelid: Secondary | ICD-10-CM | POA: Diagnosis not present

## 2014-05-23 DIAGNOSIS — H01022 Squamous blepharitis right lower eyelid: Secondary | ICD-10-CM | POA: Diagnosis not present

## 2014-05-23 NOTE — Telephone Encounter (Signed)
Pt called and needs a referral to her eye doctor. jw

## 2014-05-23 NOTE — Telephone Encounter (Signed)
Left message on voicemail for patient to call back to let us know what eye doctor she sees.

## 2014-05-23 NOTE — Telephone Encounter (Signed)
Referral written, will ask team to follow up.   Laroy Apple, MD [place] Resident, PGY-3 05/23/2014, 12:16 PM

## 2014-05-24 DIAGNOSIS — H01024 Squamous blepharitis left upper eyelid: Secondary | ICD-10-CM | POA: Diagnosis not present

## 2014-05-24 DIAGNOSIS — H01021 Squamous blepharitis right upper eyelid: Secondary | ICD-10-CM | POA: Diagnosis not present

## 2014-05-24 DIAGNOSIS — H20011 Primary iridocyclitis, right eye: Secondary | ICD-10-CM | POA: Diagnosis not present

## 2014-05-24 DIAGNOSIS — H01022 Squamous blepharitis right lower eyelid: Secondary | ICD-10-CM | POA: Diagnosis not present

## 2014-05-24 DIAGNOSIS — Z961 Presence of intraocular lens: Secondary | ICD-10-CM | POA: Diagnosis not present

## 2014-05-24 DIAGNOSIS — B0052 Herpesviral keratitis: Secondary | ICD-10-CM | POA: Diagnosis not present

## 2014-05-24 DIAGNOSIS — H1851 Endothelial corneal dystrophy: Secondary | ICD-10-CM | POA: Diagnosis not present

## 2014-05-24 DIAGNOSIS — H01025 Squamous blepharitis left lower eyelid: Secondary | ICD-10-CM | POA: Diagnosis not present

## 2014-05-30 DIAGNOSIS — H01025 Squamous blepharitis left lower eyelid: Secondary | ICD-10-CM | POA: Diagnosis not present

## 2014-05-30 DIAGNOSIS — H1851 Endothelial corneal dystrophy: Secondary | ICD-10-CM | POA: Diagnosis not present

## 2014-05-30 DIAGNOSIS — H01022 Squamous blepharitis right lower eyelid: Secondary | ICD-10-CM | POA: Diagnosis not present

## 2014-05-30 DIAGNOSIS — H01021 Squamous blepharitis right upper eyelid: Secondary | ICD-10-CM | POA: Diagnosis not present

## 2014-05-30 DIAGNOSIS — H20011 Primary iridocyclitis, right eye: Secondary | ICD-10-CM | POA: Diagnosis not present

## 2014-05-30 DIAGNOSIS — H01024 Squamous blepharitis left upper eyelid: Secondary | ICD-10-CM | POA: Diagnosis not present

## 2014-05-30 DIAGNOSIS — H4041X4 Glaucoma secondary to eye inflammation, right eye, indeterminate stage: Secondary | ICD-10-CM | POA: Diagnosis not present

## 2014-05-30 DIAGNOSIS — B0052 Herpesviral keratitis: Secondary | ICD-10-CM | POA: Diagnosis not present

## 2014-06-02 ENCOUNTER — Ambulatory Visit: Payer: Commercial Managed Care - HMO | Admitting: Family Medicine

## 2014-06-02 DIAGNOSIS — H01022 Squamous blepharitis right lower eyelid: Secondary | ICD-10-CM | POA: Diagnosis not present

## 2014-06-02 DIAGNOSIS — H20011 Primary iridocyclitis, right eye: Secondary | ICD-10-CM | POA: Diagnosis not present

## 2014-06-02 DIAGNOSIS — H4041X4 Glaucoma secondary to eye inflammation, right eye, indeterminate stage: Secondary | ICD-10-CM | POA: Diagnosis not present

## 2014-06-02 DIAGNOSIS — B0052 Herpesviral keratitis: Secondary | ICD-10-CM | POA: Diagnosis not present

## 2014-06-02 DIAGNOSIS — H01021 Squamous blepharitis right upper eyelid: Secondary | ICD-10-CM | POA: Diagnosis not present

## 2014-06-02 DIAGNOSIS — H01024 Squamous blepharitis left upper eyelid: Secondary | ICD-10-CM | POA: Diagnosis not present

## 2014-06-02 DIAGNOSIS — H1851 Endothelial corneal dystrophy: Secondary | ICD-10-CM | POA: Diagnosis not present

## 2014-06-02 DIAGNOSIS — H01025 Squamous blepharitis left lower eyelid: Secondary | ICD-10-CM | POA: Diagnosis not present

## 2014-06-09 ENCOUNTER — Encounter: Payer: Self-pay | Admitting: Family Medicine

## 2014-06-09 ENCOUNTER — Ambulatory Visit (INDEPENDENT_AMBULATORY_CARE_PROVIDER_SITE_OTHER): Payer: Commercial Managed Care - HMO | Admitting: Family Medicine

## 2014-06-09 VITALS — BP 139/69 | HR 108 | Temp 98.8°F | Ht 63.0 in | Wt 223.3 lb

## 2014-06-09 DIAGNOSIS — I5032 Chronic diastolic (congestive) heart failure: Secondary | ICD-10-CM

## 2014-06-09 DIAGNOSIS — E1165 Type 2 diabetes mellitus with hyperglycemia: Secondary | ICD-10-CM | POA: Diagnosis not present

## 2014-06-09 DIAGNOSIS — K859 Acute pancreatitis without necrosis or infection, unspecified: Secondary | ICD-10-CM

## 2014-06-09 DIAGNOSIS — H20011 Primary iridocyclitis, right eye: Secondary | ICD-10-CM | POA: Diagnosis not present

## 2014-06-09 DIAGNOSIS — K861 Other chronic pancreatitis: Secondary | ICD-10-CM

## 2014-06-09 DIAGNOSIS — H01021 Squamous blepharitis right upper eyelid: Secondary | ICD-10-CM | POA: Diagnosis not present

## 2014-06-09 DIAGNOSIS — H01022 Squamous blepharitis right lower eyelid: Secondary | ICD-10-CM | POA: Diagnosis not present

## 2014-06-09 DIAGNOSIS — IMO0002 Reserved for concepts with insufficient information to code with codable children: Secondary | ICD-10-CM

## 2014-06-09 DIAGNOSIS — B0052 Herpesviral keratitis: Secondary | ICD-10-CM | POA: Diagnosis not present

## 2014-06-09 DIAGNOSIS — Z961 Presence of intraocular lens: Secondary | ICD-10-CM | POA: Diagnosis not present

## 2014-06-09 DIAGNOSIS — H1851 Endothelial corneal dystrophy: Secondary | ICD-10-CM | POA: Diagnosis not present

## 2014-06-09 DIAGNOSIS — H01025 Squamous blepharitis left lower eyelid: Secondary | ICD-10-CM | POA: Diagnosis not present

## 2014-06-09 DIAGNOSIS — H01024 Squamous blepharitis left upper eyelid: Secondary | ICD-10-CM | POA: Diagnosis not present

## 2014-06-09 LAB — LIPASE: Lipase: 63 U/L (ref 0–75)

## 2014-06-09 LAB — COMPREHENSIVE METABOLIC PANEL WITH GFR
ALT: 15 U/L (ref 0–35)
AST: 19 U/L (ref 0–37)
Albumin: 4.2 g/dL (ref 3.5–5.2)
Alkaline Phosphatase: 91 U/L (ref 39–117)
BUN: 17 mg/dL (ref 6–23)
CO2: 20 meq/L (ref 19–32)
Calcium: 8.9 mg/dL (ref 8.4–10.5)
Chloride: 111 meq/L (ref 96–112)
Creat: 0.81 mg/dL (ref 0.50–1.10)
Glucose, Bld: 172 mg/dL — ABNORMAL HIGH (ref 70–99)
Potassium: 3.5 meq/L (ref 3.5–5.3)
Sodium: 140 meq/L (ref 135–145)
Total Bilirubin: 0.3 mg/dL (ref 0.2–1.2)
Total Protein: 7.5 g/dL (ref 6.0–8.3)

## 2014-06-09 MED ORDER — OXYCODONE-ACETAMINOPHEN 5-325 MG PO TABS: 1.0000 | ORAL_TABLET | Freq: Three times a day (TID) | ORAL | Status: DC | PRN

## 2014-06-09 NOTE — Assessment & Plan Note (Signed)
Uncontrolled, decreasing Lantus dose due to difficulty with oral intake during pancreatitis Following up early next week Foot exam today normal Has seen ophthalmology in the last 3 weeks

## 2014-06-09 NOTE — Assessment & Plan Note (Signed)
Symptoms Of orthopnea and dyspnea today Reevaluate with echocardiogram, last in 2011 had grade 1 diastolic dysfunction and normal EF.

## 2014-06-09 NOTE — Progress Notes (Signed)
Patient ID: [name] [name], female   DOB: 1950-11-08, 64 y.o.   MRN: [id number]   HPI  Patient presents today for follow-up, abdominal pain, dyspnea  Dyspnea Slowly progressive over the last several weeks, helped by albuterol, worse at night with lying flat, Still having good diuresis with Lasix Has history of grade 1 diastolic dysfunction Breathing fine today in clinic after using albuterol, also complaining of bilateral lower extremity swelling  Abdominal pain Slowly progressive in the last 2 weeks, described as epigastric radiating to her back, also lower abdominal pain centered around an old hernia area No erythema, swelling, or firmness at the area of the old hernia No fever, chills, sweats Having increased epigastric pain with eating or drinking, still able to tolerate oral fluids Has not tried any pain medications for the pain.  Diabetes No recent hypoglycemic episodes despite decreased oral intake Taking 40 units of Lantus twice a day, 15 units of NovoLog with eating BID   Smoking status noted ROS: Per HPI  Objective: BP 139/69 mmHg  Pulse 108  Temp(Src) 98.8 F (37.1 C) (Oral)  Ht 5\' 3"  (1.6 m)  Wt 223 lb 4.8 oz (101.288 kg)  BMI 39.57 kg/m2 Gen: NAD, alert, cooperative with exam HEENT: NCAT CV: RRR, good S1/S2, no murmur Resp: CTABL, no wheezes, non-labored Abd: Soft, tenderness to palpation in epigastric area, small abdominal wall defect at the site of an old scar on her right lower abdomen without tenderness to palpation, no erythema or induration of that side. Ext: No edema, warm Neuro: Alert and oriented, No gross deficits  Assessment and plan:  Pancreatitis, recurrent Abdominal pain today consistent with pancreatitis Check lipase, CMP Prescribed short course of narcotics, follow-up in 3-4 days. Discussed cutting down Lantus to 30 units twice daily while not taking normal food intake, encouraged her to take NovoLog if she is drinking sugar sweetened  beverages or using, otherwise hold that for now as well. Discussed red flags for return for emergency care including unable to tolerate food or fluid orally or uncontrolled pain.   DM (diabetes mellitus), type 2, uncontrolled Uncontrolled, decreasing Lantus dose due to difficulty with oral intake during pancreatitis Following up early next week Foot exam today normal Has seen ophthalmology in the last 3 weeks   Chronic diastolic congestive heart failure Symptoms Of orthopnea and dyspnea today Reevaluate with echocardiogram, last in 2011 had grade 1 diastolic dysfunction and normal EF.      Orders Placed This Encounter  Procedures  . Lipase  . Comprehensive metabolic panel  . 2D Echocardiogram with contrast    Standing Status: Future     Number of Occurrences:      Standing Expiration Date: 06/10/2015    Order Specific Question:  Type of Echo    Answer:  Complete    Order Specific Question:  Where should this test be performed    Answer:  [facility]    Order Specific Question:  Reason for exam-Echo    Answer:  Congestive Heart Failure  428.0 / I50.9    Meds ordered this encounter  Medications  . oxyCODONE-acetaminophen (ROXICET) 5-325 MG per tablet    Sig: Take 1 tablet by mouth every 8 (eight) hours as needed for severe pain.    Dispense:  30 tablet    Refill:  0

## 2014-06-09 NOTE — Patient Instructions (Signed)
Great to see you!  I think you are having a flare up of your pancreatitis, if you cannot tolerate food or fluid by mouth or if your pain is not controlled with the pain medications are given unit you should seek care at the emergency room this weekend.  Please come back for follow-up to see me or any of the doctors in this practice next Monday or Tuesday.  Please schedule a mammogram  While you are not able to tolerate food as easily as usual you may cutdown your Lantus to 30 units twice daily, I would like if you continue to take her NovoLog as long as you're eating any food at all or drinking sugar sweetened beverages.   Acute Pancreatitis Acute pancreatitis is a disease in which the pancreas becomes suddenly inflamed. The pancreas is a large gland located behind your stomach. The pancreas produces enzymes that help digest food. The pancreas also releases the hormones glucagon and insulin that help regulate blood sugar. Damage to the pancreas occurs when the digestive enzymes from the pancreas are activated and begin attacking the pancreas before being released into the intestine. Most acute attacks last a couple of days and can cause serious complications. Some people become dehydrated and develop low blood pressure. In severe cases, bleeding into the pancreas can lead to shock and can be life-threatening. The lungs, heart, and kidneys may fail. CAUSES  Pancreatitis can happen to anyone. In some cases, the cause is unknown. Most cases are caused by:  Alcohol abuse.  Gallstones. Other less common causes are:  Certain medicines.  Exposure to certain chemicals.  Infection.  Damage caused by an accident (trauma).  Abdominal surgery. SYMPTOMS   Pain in the upper abdomen that may radiate to the back.  Tenderness and swelling of the abdomen.  Nausea and vomiting. DIAGNOSIS  Your caregiver will perform a physical exam. Blood and stool tests may be done to confirm the diagnosis.  Imaging tests may also be done, such as X-rays, CT scans, or an ultrasound of the abdomen. TREATMENT  Treatment usually requires a stay in the hospital. Treatment may include:  Pain medicine.  Fluid replacement through an intravenous line (IV).  Placing a tube in the stomach to remove stomach contents and control vomiting.  Not eating for 3 or 4 days. This gives your pancreas a rest, because enzymes are not being produced that can cause further damage.  Antibiotic medicines if your condition is caused by an infection.  Surgery of the pancreas or gallbladder. HOME CARE INSTRUCTIONS   Follow the diet advised by your caregiver. This may involve avoiding alcohol and decreasing the amount of fat in your diet.  Eat smaller, more frequent meals. This reduces the amount of digestive juices the pancreas produces.  Drink enough fluids to keep your urine clear or pale yellow.  Only take over-the-counter or prescription medicines as directed by your caregiver.  Avoid drinking alcohol if it caused your condition.  Do not smoke.  Get plenty of rest.  Check your blood sugar at home as directed by your caregiver.  Keep all follow-up appointments as directed by your caregiver. SEEK MEDICAL CARE IF:   You do not recover as quickly as expected.  You develop new or worsening symptoms.  You have persistent pain, weakness, or nausea.  You recover and then have another episode of pain. SEEK IMMEDIATE MEDICAL CARE IF:   You are unable to eat or keep fluids down.  Your pain becomes severe.  You  have a fever or persistent symptoms for more than 2 to 3 days.  You have a fever and your symptoms suddenly get worse.  Your skin or the white part of your eyes turn yellow (jaundice).  You develop vomiting.  You feel dizzy, or you faint.  Your blood sugar is high (over 300 mg/dL). MAKE SURE YOU:   Understand these instructions.  Will watch your condition.  Will get help right away if  you are not doing well or get worse. Document Released: 04/07/2005 Document Revised: 10/07/2011 Document Reviewed: 07/17/2011 [facility] Patient Information 2015 [place], [place]. This information is not intended to replace advice given to you by your health care provider. Make sure you discuss any questions you have with your health care provider.

## 2014-06-09 NOTE — Assessment & Plan Note (Addendum)
Abdominal pain today consistent with pancreatitis Check lipase, CMP Prescribed short course of narcotics, follow-up in 3-4 days. Discussed cutting down Lantus to 30 units twice daily while not taking normal food intake, encouraged her to take NovoLog if she is drinking sugar sweetened beverages or using, otherwise hold that for now as well. Discussed red flags for return for emergency care including unable to tolerate food or fluid orally or uncontrolled pain.

## 2014-06-12 ENCOUNTER — Other Ambulatory Visit: Payer: Self-pay | Admitting: *Deleted

## 2014-06-12 MED ORDER — FUROSEMIDE 20 MG PO TABS: 20.0000 mg | ORAL_TABLET | Freq: Every day | ORAL | Status: DC | PRN

## 2014-06-13 ENCOUNTER — Ambulatory Visit (INDEPENDENT_AMBULATORY_CARE_PROVIDER_SITE_OTHER): Payer: Commercial Managed Care - HMO | Admitting: Family Medicine

## 2014-06-13 ENCOUNTER — Encounter: Payer: Self-pay | Admitting: Family Medicine

## 2014-06-13 VITALS — BP 194/84 | HR 87 | Temp 98.6°F | Ht 63.0 in | Wt 228.0 lb

## 2014-06-13 DIAGNOSIS — R1013 Epigastric pain: Secondary | ICD-10-CM

## 2014-06-13 DIAGNOSIS — R1084 Generalized abdominal pain: Secondary | ICD-10-CM | POA: Diagnosis not present

## 2014-06-13 DIAGNOSIS — R109 Unspecified abdominal pain: Secondary | ICD-10-CM | POA: Insufficient documentation

## 2014-06-13 LAB — CBC
HCT: 36.9 % (ref 36.0–46.0)
Hemoglobin: 12 g/dL (ref 12.0–15.0)
MCH: 25.8 pg — ABNORMAL LOW (ref 26.0–34.0)
MCHC: 32.5 g/dL (ref 30.0–36.0)
MCV: 79.2 fL (ref 78.0–100.0)
Platelets: 169 10*3/uL (ref 150–400)
RBC: 4.66 MIL/uL (ref 3.87–5.11)
RDW: 17.3 % — ABNORMAL HIGH (ref 11.5–15.5)
WBC: 9.7 10*3/uL (ref 4.0–10.5)

## 2014-06-13 LAB — COMPREHENSIVE METABOLIC PANEL
ALT: 15 U/L (ref 0–35)
AST: 16 U/L (ref 0–37)
Albumin: 3.8 g/dL (ref 3.5–5.2)
Alkaline Phosphatase: 77 U/L (ref 39–117)
BUN: 9 mg/dL (ref 6–23)
CO2: 25 mEq/L (ref 19–32)
Calcium: 8.9 mg/dL (ref 8.4–10.5)
Chloride: 106 mEq/L (ref 96–112)
Creat: 0.78 mg/dL (ref 0.50–1.10)
Glucose, Bld: 56 mg/dL — ABNORMAL LOW (ref 70–99)
Potassium: 3.2 mEq/L — ABNORMAL LOW (ref 3.5–5.3)
Sodium: 141 mEq/L (ref 135–145)
Total Bilirubin: 0.4 mg/dL (ref 0.2–1.2)
Total Protein: 7 g/dL (ref 6.0–8.3)

## 2014-06-13 LAB — LIPASE: Lipase: 197 U/L — ABNORMAL HIGH (ref 0–75)

## 2014-06-13 NOTE — Assessment & Plan Note (Addendum)
Differential includes pancreatitis flare vs mesenteric ischemia vs incarcerated bowel vs hernia pain. CT 6 months ago showed no AAA, so dissected AA unlikely. Physical exam generally unimpressive, though limited exam. Low concern for appendicitis or gallbladder/liver pathology. Will obtain CBC, CMP, lipase, and lactic acid today. Will also obtain UA to rule out UTI/cystitis/pyelo (though less likely given lack of systemic symptoms). Will give 4mg  of morphine in office. Recommended patient to continue taking oxycodone as needed for pain and to avoid NSAIDs. Instructed patient to stay hydrated as possible and to stop the furosemide if she loses too much weight. Will follow up in 1 week.

## 2014-06-13 NOTE — Patient Instructions (Signed)
Thank you for coming to the clinic today. It was nice seeing you.  For your abdominal pain, we will be rechecking your blood work and urine today. You can continue taking the oxycodone as needed for pain. Please stop taking naproxen and mobic. You can continue to eat bland foods such as broth and jello while you are feeling sick.  Please keep a close eye on your weight, if you are losing weight, please stop taking your furosemide.   Please go to the emergency room if your pain becomes severe and unresponsive to pain medications.  Please come back to the clinic within the next week.

## 2014-06-13 NOTE — Progress Notes (Signed)
[name] [name] is a 64 y.o. female who presents to the [facility] today with a chief complaint of abdominal pain.   Her concerns today include:  HPI:  Abdominal Pain Patient seen in this office 4 days ago with the same complain. Symptoms started about 2-3 weeks ago. Patient has history of pancreatitis, and though that it was a flare so she came in last week. Lipase and CMP at that time were normal. Patient was given short course of oxycodone.  Today, says that her pain is the same, if not worsening. Pain is described as "gutting" and radiates from her epigastrum to her pack. Pain is constant. Has tried eating broth and jello, which increase her pain.  Last BM 2 days ago, black and green in appearance. No gross blood. Patient stated that she passed several dark blood clots about 3-4 weeks ago.  No fevers or chills. No dysuria or urinary frequency. Some nausea, no vomiting.    ROS: As per HPI  Past Medical History - Reviewed and updated Patient Active Problem List   Diagnosis Date Noted  . Abdominal pain 06/13/2014  . Chronic diastolic congestive heart failure 06/09/2014  . Healthcare maintenance 05/01/2014  . Neck pain of over 3 months duration 01/27/2014  . Headache(784.0) 12/20/2013  . Drug rash 11/24/2013  . Leg swelling 11/22/2013  . Acute pancreatitis 11/14/2013  . Acute sinusitis 08/12/2013  . Candidal intertrigo 05/11/2013  . Dyshidrotic hand dermatitis 05/11/2013  . Fibromyalgia 02/14/2013  . Diabetic peripheral neuropathy 02/14/2013  . Ingrown nail 01/04/2013  . H/O ventral hernia repair 08/13/2012  . Benign neoplasm of colon 12/31/2011  . Epigastric abdominal pain 12/29/2011  . Cough 12/17/2011  . CAD (coronary artery disease) 11/25/2011  . Annual physical exam 11/04/2011  . Chest pain 10/31/2011  . Neuropathy 03/01/2011  . Tinea pedis 02/17/2011  . Palpitations 01/24/2011  . Umbilical hernia 95/18/8416  . Diabetic gastroparesis associated with type 2 diabetes  mellitus 11/07/2010  . Pancreatitis, recurrent 11/07/2010  . Anemia 09/25/2010  . Allergic rhinitis 06/28/2010  . CONSTIPATION 02/26/2010  . DM (diabetes mellitus), type 2, uncontrolled 09/10/2009  . DEPRESSION, MAJOR, WITH PSYCHOTIC BEHAVIOR 08/14/2009  . Chronic pain syndrome 08/14/2009  . CATARACT EXTRACTIONS, BILATERAL, HX OF 08/14/2009  . Tobacco abuse counseling 12/30/2008  . GASTROESOPHAGEAL REFLUX DISEASE, CHRONIC 02/11/2008  . HYPERTENSION, BENIGN ESSENTIAL 10/21/2006  . URINARY INCONTINENCE 10/21/2006    Medications- reviewed and updated Current Outpatient Prescriptions  Medication Sig Dispense Refill  . albuterol (PROVENTIL HFA;VENTOLIN HFA) 108 (90 BASE) MCG/ACT inhaler Inhale 2 puffs into the lungs every 4 (four) hours as needed for wheezing or shortness of breath.    Marland Kitchen albuterol (PROVENTIL) (2.5 MG/3ML) 0.083% nebulizer solution Take 3 mLs (2.5 mg total) by nebulization every 6 (six) hours as needed for wheezing. 150 mL 1  . ALREX 0.2 % SUSP Apply 1-2 drops to eye 2 (two) times daily as needed (itching).     . ARTIFICIAL TEAR OP Place 1 drop into both eyes 2 (two) times daily as needed (dry eyes).    Marland Kitchen aspirin 81 MG chewable tablet     . aspirin EC 81 MG tablet Take 81 mg by mouth every morning.     . Aspirin-Acetaminophen-Caffeine (EXCEDRIN MIGRAINE PO) Take 2 capsules by mouth 3 (three) times daily as needed (migraine).     Marland Kitchen atorvastatin (LIPITOR) 20 MG tablet Take 20 mg by mouth every morning.    . B-D ULTRAFINE III SHORT PEN 31G X 8 MM  MISC USE 5 TIMES A DAY, WITH LANTUS AND NOVOLOG 100 each PRN  . beclomethasone (QVAR) 80 MCG/ACT inhaler Inhale 2 puffs into the lungs 2 (two) times daily. 1 Inhaler 12  . Blood Glucose Monitoring Suppl (RELION ULTIMA GLUCOSE SYSTEM) W/DEVICE KIT 1 Device by Does not apply route once. Dx:   Diabetes 250.02 1 kit 0  . carvedilol (COREG) 25 MG tablet take 1 tablet by mouth twice a day with food 60 tablet 3  . clobetasol ointment  (TEMOVATE) 7.86 % Apply 1 application topically 2 (two) times daily. Apply under nails (Patient not taking: Reported on 03/29/2014) 30 g 0  . Difluprednate (DUREZOL) 0.05 % EMUL Place 2 drops into the right eye 2 (two) times daily. 5 mL 6  . diphenhydrAMINE (BENADRYL) 25 mg capsule Take 25 mg by mouth every 6 (six) hours as needed for itching.     . docusate sodium (COLACE) 100 MG capsule Take 100 mg by mouth 2 (two) times daily.     Marland Kitchen doxycycline (VIBRA-TABS) 100 MG tablet Take 1 tablet (100 mg total) by mouth 2 (two) times daily. 20 tablet 0  . DULoxetine (CYMBALTA) 20 MG capsule Take 1 capsule (20 mg total) by mouth daily. (Patient not taking: Reported on 03/29/2014) 30 capsule 3  . erythromycin (ERYTHROCIN STEARATE) 250 MG tablet Take 1 tablet (250 mg total) by mouth 3 (three) times daily. 90 tablet 3  . furosemide (LASIX) 20 MG tablet Take 1 tablet (20 mg total) by mouth daily as needed for fluid or edema. 30 tablet 3  . glucose blood (RELION GLUCOSE TEST STRIPS) test strip Use as instructed 100 each 12  . hydrochlorothiazide (HYDRODIURIL) 25 MG tablet Take 1 tablet (25 mg total) by mouth every morning. 90 tablet 3  . insulin aspart (NOVOLOG FLEXPEN) 100 UNIT/ML FlexPen Inject 15 Units into the skin 3 (three) times daily with meals. QS for 1 month supply 2 pen 0  . Insulin Glargine (LANTUS) 100 UNIT/ML Solostar Pen Inject 40 Units into the skin 2 (two) times daily. Dispense QS: 1 month supply. 3 mL 6  . lidocaine-prilocaine (EMLA) cream Apply 1 application topically 3 (three) times daily as needed (pain).     Marland Kitchen lisinopril (PRINIVIL,ZESTRIL) 40 MG tablet Take 1 tablet (40 mg total) by mouth daily. 30 tablet 11  . metoCLOPramide (REGLAN) 10 MG tablet Take 1 tablet (10 mg total) by mouth 3 (three) times daily before meals. 90 tablet 3  . metoprolol (LOPRESSOR) 100 MG tablet Take 200 mg by mouth 2 (two) times daily.   0  . nortriptyline (PAMELOR) 25 MG capsule Take 1 capsule (25 mg total) by mouth at  bedtime. 30 capsule 3  . nystatin (MYCOSTATIN) 100000 UNIT/ML suspension Take 5 mLs (500,000 Units total) by mouth 2 (two) times daily. Swish and swallow (Patient not taking: Reported on 03/29/2014) 60 mL 1  . nystatin (MYCOSTATIN) powder Apply topically 3 (three) times daily. To affected area (Patient not taking: Reported on 03/29/2014) 30 g 3  . olopatadine (PATANOL) 0.1 % ophthalmic solution Place 1 drop into both eyes 2 (two) times daily. 5 mL 12  . omeprazole (PRILOSEC) 40 MG capsule Take 1 capsule (40 mg total) by mouth daily. 30 capsule 3  . ondansetron (ZOFRAN ODT) 4 MG disintegrating tablet 69m ODT q4 hours prn nausea/vomit 8 tablet 0  . ondansetron (ZOFRAN) 4 MG tablet Take 1 tablet (4 mg total) by mouth every 8 (eight) hours as needed for nausea or vomiting. For  nausea (Patient not taking: Reported on 03/29/2014) 30 tablet 3  . oxyCODONE-acetaminophen (ROXICET) 5-325 MG per tablet Take 1 tablet by mouth every 8 (eight) hours as needed for severe pain. 30 tablet 0  . pentosan polysulfate (ELMIRON) 100 MG capsule Take 1 capsule (100 mg total) by mouth 3 (three) times daily before meals. 270 capsule 1  . phenazopyridine (PYRIDIUM) 200 MG tablet Take 200 mg by mouth 3 (three) times daily as needed (bladder pain).     . polyethylene glycol (GLYCOLAX) packet Take 17 g by mouth daily as needed for mild constipation. Mix in 4-6 oz of water    . prednisoLONE acetate (PRED FORTE) 1 % ophthalmic suspension Place 1 drop into both eyes 2 (two) times daily. 5 mL 6  . pregabalin (LYRICA) 150 MG capsule Take 1 capsule (150 mg total) by mouth 3 (three) times daily. 90 capsule 3  . QUEtiapine (SEROQUEL) 200 MG tablet Take 0.5 tablets (100 mg total) by mouth at bedtime. 30 tablet 6  . ranitidine (ZANTAC) 150 MG tablet Take 150 mg by mouth daily as needed. For acid reflux    . ReliOn Ultra Thin Lancets MISC 1 Container by Does not apply route once. Dispense QS lancets for 3 times daily testing.  Dx:   Diabetes  250.02 1 each prn  . spironolactone (ALDACTONE) 50 MG tablet Take 1 tablet (50 mg total) by mouth 2 (two) times daily. 60 tablet 1   No current facility-administered medications for this visit.    Objective: Physical Exam: BP 194/84 mmHg  Pulse 87  Temp(Src) 98.6 F (37 C) (Oral)  Ht 5' 3"  (1.6 m)  Wt 228 lb (103.42 kg)  BMI 40.40 kg/m2  Gen: Ill appearing, sitting in chair CV: RRR with no murmurs appreciated Lungs: NWOB, CTAB with no crackles, wheezes, or rhonchi Abdomen: Difficult exam given body habitus and reluctance to lay flat on table. +BS, soft, mildly distended. No focal point tenderness. Negative Murphy's sign. No RLQ tenderness. No rebound. No guarding.  Skin: warm, dry Neuro: grossly normal, moves all extremities  A/P: See problem list  Abdominal pain Differential includes pancreatitis flare vs mesenteric ischemia vs incarcerated bowel vs hernia pain. CT 6 months ago showed no AAA, so dissected AA unlikely. Physical exam generally unimpressive, though limited exam. Low concern for appendicitis or gallbladder/liver pathology. Will obtain CBC, CMP, lipase, and lactic acid today. Will also obtain UA to rule out UTI/cystitis/pyelo (though less likely given lack of systemic symptoms). Will give 80m of morphine in office. Recommended patient to continue taking oxycodone as needed for pain and to avoid NSAIDs. Instructed patient to stay hydrated as possible and to stop the furosemide if she loses too much weight. Will follow up in 1 week.      Orders Placed This Encounter  Procedures  . CBC  . Comprehensive metabolic panel  . Lipase  . Lactic Acid, Plasma  . POCT urinalysis dipstick   Murdis Flitton M. PJerline Pain MWilkinsonResident PGY-1 06/13/2014 12:58 PM

## 2014-06-14 ENCOUNTER — Telehealth: Payer: Self-pay | Admitting: Family Medicine

## 2014-06-14 ENCOUNTER — Ambulatory Visit (HOSPITAL_COMMUNITY)
Admission: RE | Admit: 2014-06-14 | Discharge: 2014-06-14 | Disposition: A | Discharge status: Home / Self Care | Payer: Commercial Managed Care - HMO | Source: Ambulatory Visit | Attending: Family Medicine | Admitting: Family Medicine

## 2014-06-14 DIAGNOSIS — I5032 Chronic diastolic (congestive) heart failure: Secondary | ICD-10-CM | POA: Insufficient documentation

## 2014-06-14 DIAGNOSIS — I509 Heart failure, unspecified: Secondary | ICD-10-CM

## 2014-06-14 LAB — LACTIC ACID, PLASMA: LACTIC ACID: 1.5 mmol/L (ref 0.5–2.2)

## 2014-06-14 NOTE — Progress Notes (Signed)
  Echocardiogram 2D Echocardiogram has been performed.  [name] [name] 06/14/2014, 11:43 AM

## 2014-06-14 NOTE — Telephone Encounter (Signed)
Called to discuss labs and TTE. She has grade 2 diastolic dysfunction which is slightly worsened from grade 14 years ago.  Her recent lab shows elevated lipase consistent with mild pancreatitis.  Left voicemail stating that one check on her and discuss recent findings, ask her to call back if she like to know results sooner than her follow-up in one week  Laroy Apple, MD [place] Resident, PGY-3 06/14/2014, 5:14 PM

## 2014-06-20 ENCOUNTER — Encounter: Payer: Self-pay | Admitting: Family Medicine

## 2014-06-20 ENCOUNTER — Ambulatory Visit (INDEPENDENT_AMBULATORY_CARE_PROVIDER_SITE_OTHER): Payer: Commercial Managed Care - HMO | Admitting: Family Medicine

## 2014-06-20 DIAGNOSIS — R51 Headache: Secondary | ICD-10-CM | POA: Diagnosis not present

## 2014-06-20 DIAGNOSIS — K861 Other chronic pancreatitis: Secondary | ICD-10-CM

## 2014-06-20 DIAGNOSIS — K859 Acute pancreatitis without necrosis or infection, unspecified: Secondary | ICD-10-CM

## 2014-06-20 DIAGNOSIS — R519 Headache, unspecified: Secondary | ICD-10-CM

## 2014-06-20 LAB — COMPREHENSIVE METABOLIC PANEL
ALT: 32 U/L (ref 0–35)
AST: 29 U/L (ref 0–37)
Albumin: 3.8 g/dL (ref 3.5–5.2)
Alkaline Phosphatase: 83 U/L (ref 39–117)
BUN: 7 mg/dL (ref 6–23)
CO2: 28 mEq/L (ref 19–32)
Calcium: 8.9 mg/dL (ref 8.4–10.5)
Chloride: 102 mEq/L (ref 96–112)
Creat: 0.77 mg/dL (ref 0.50–1.10)
Glucose, Bld: 71 mg/dL (ref 70–99)
Potassium: 3.1 mEq/L — ABNORMAL LOW (ref 3.5–5.3)
Sodium: 144 mEq/L (ref 135–145)
Total Bilirubin: 0.4 mg/dL (ref 0.2–1.2)
Total Protein: 7.1 g/dL (ref 6.0–8.3)

## 2014-06-20 LAB — LIPASE: Lipase: 72 U/L (ref 0–75)

## 2014-06-20 LAB — POCT SEDIMENTATION RATE: POCT SED RATE: 50 mm/hr — AB (ref 0–22)

## 2014-06-20 MED ORDER — MORPHINE SULFATE 10 MG/ML IJ SOLN
2.0000 mg | Freq: Once | INTRAMUSCULAR | Status: AC
  Administered 2014-06-20: 2 mg via INTRAMUSCULAR

## 2014-06-20 MED ORDER — OXYCODONE-ACETAMINOPHEN 5-325 MG PO TABS: 1.0000 | ORAL_TABLET | Freq: Three times a day (TID) | ORAL | Status: DC | PRN

## 2014-06-20 NOTE — Patient Instructions (Signed)
Good to See you, I am sorry you are still hurting  Continue to drink plenty of fluids for the next 3 days as well as avoid food. Foods may make your abdominal pain worse. If you can tolerate small amounts of food without worsening pain it's okay to go ahead and eat.  If you have worsening abdominal pain, pain that's not helped by your oxycodone, fever, or if you're unable to tolerate fluids by mouth you need to go to the emergency room for emergency medical care.  With your headache, if you develop weakness, trouble talking, trouble thinking, numbness or tingling, or your vision gets worse youould seek help in the emergency room.

## 2014-06-20 NOTE — Assessment & Plan Note (Signed)
Continued abdominal pain with elevated lipase on last check about a week ago Tolerating oral fluids well, avoiding foods mostly with some increase abdominal pain with soft foods Discussed fluids only, red flags to go to the emergency room for care including not able to tolerate fluids, oral pain medicines not controlling pain, or worsening pain. 5 mg oxycodone given 30 pills, 2 mg IM morphine given in clinic Follow-up in 3 days

## 2014-06-20 NOTE — Progress Notes (Signed)
Patient ID: [name] [name], female   DOB: 04-27-1950, 64 y.o.   MRN: [id number]   HPI  Patient presents today for follow-up abdominal pain and headache  Abdominal pain First seen for pancreatitis is related abdominal pain on file 19th, 12 days ago. Since that time her abdominal pain is slowly gotten worse that she's been doing well with oxycodone at home. Her lipase was initially normal but then elevated to around 200. She has a history of recurrent pancreatitis. Since she's been home she has not been tolerating foods that she is tolerating fluids easily. She states that she's eaten a boiled egg a few times with no worsening of pain. Describes her abdominal pain is midepigastric pain radiating around the right to her back and also over to her left lower quadrant and right lower quadrant.  Headache She is a right-sided throbbing headache she states started 3-4 days ago. She states at the same time her chronic right-sided visual change got worse. She has been seeing an ophthalmologist for right high problem which she can't remember the diagnosis. She states that the onset of the headache the vision got worse on that same side. She has a history of recurrent headache that is similar to this and states that her vision has been altered for months but is worse with this headache. Denies any weakness, numbness.  Smoking status noted - occasionally ROS: Per HPI  Objective: BP 168/79 mmHg  Pulse 86  Temp(Src) 98.7 F (37.1 C) (Oral)  Ht 5\' 3"  (1.6 m)  Wt 226 lb 11.2 oz (102.83 kg)  BMI 40.17 kg/m2 Gen: NAD, alert, cooperative with exam HEENT: NCAT, EOMI, PERRL CV: RRR, good S1/S2, no murmur Resp: CTABL, no wheezes, non-labored Abd: Soft, tenderness to palpation throughout, tenderness to palpation most severe in the midepigastric area Ext: No edema, warm Neuro: Alert and oriented, No gross deficits  Assessment and plan:  Pancreatitis, recurrent Continued abdominal pain with elevated lipase  on last check about a week ago Tolerating oral fluids well, avoiding foods mostly with some increase abdominal pain with soft foods Discussed fluids only, red flags to go to the emergency room for care including not able to tolerate fluids, oral pain medicines not controlling pain, or worsening pain. 5 mg oxycodone given 30 pills, 2 mg IM morphine given in clinic Follow-up in 3 days   Headache Severe right-sided headache worse with sounds and smell of food consistent with migraine headache She has chronic right-sided vision changes which she says have worsened since the onset of the headache Sedimentation rate 50 today ruling out temporal arteritis, follow-up in 3 days as planned     Orders Placed This Encounter  Procedures  . Comprehensive metabolic panel  . Lipase  . POCT SEDIMENTATION RATE

## 2014-06-20 NOTE — Assessment & Plan Note (Addendum)
Severe right-sided headache worse with sounds and smell of food consistent with migraine headache She has chronic right-sided vision changes which she says have worsened since the onset of the headache Sedimentation rate 50 today ruling out temporal arteritis, follow-up in 3 days as planned

## 2014-06-21 ENCOUNTER — Telehealth: Payer: Self-pay | Admitting: Family Medicine

## 2014-06-21 MED ORDER — MORPHINE SULFATE 4 MG/ML IJ SOLN
4.0000 mg | Freq: Once | INTRAMUSCULAR | Status: AC
  Administered 2014-06-21: 4 mg via INTRAMUSCULAR

## 2014-06-21 NOTE — Addendum Note (Signed)
Addended by: Johny Shears on: 06/21/2014 09:10 AM   Modules accepted: Orders

## 2014-06-21 NOTE — Telephone Encounter (Signed)
Left VM, will plan to review labs in 3 days when she follows up.   Pancreatitis resolving, mild Hypo K likely from diarrhea or poor PO intake with pancreatitis.   Laroy Apple, MD [place] Resident, PGY-3 06/21/2014, 9:33 AM

## 2014-06-23 ENCOUNTER — Ambulatory Visit (INDEPENDENT_AMBULATORY_CARE_PROVIDER_SITE_OTHER): Payer: Commercial Managed Care - HMO | Admitting: Family Medicine

## 2014-06-23 ENCOUNTER — Encounter: Payer: Self-pay | Admitting: Family Medicine

## 2014-06-23 VITALS — BP 153/73 | HR 70 | Temp 98.2°F | Ht 63.0 in | Wt 227.4 lb

## 2014-06-23 DIAGNOSIS — K859 Acute pancreatitis without necrosis or infection, unspecified: Secondary | ICD-10-CM

## 2014-06-23 DIAGNOSIS — R51 Headache: Secondary | ICD-10-CM

## 2014-06-23 DIAGNOSIS — E1165 Type 2 diabetes mellitus with hyperglycemia: Secondary | ICD-10-CM

## 2014-06-23 DIAGNOSIS — K861 Other chronic pancreatitis: Secondary | ICD-10-CM | POA: Diagnosis not present

## 2014-06-23 DIAGNOSIS — Z0000 Encounter for general adult medical examination without abnormal findings: Secondary | ICD-10-CM

## 2014-06-23 DIAGNOSIS — R519 Headache, unspecified: Secondary | ICD-10-CM

## 2014-06-23 DIAGNOSIS — IMO0002 Reserved for concepts with insufficient information to code with codable children: Secondary | ICD-10-CM

## 2014-06-23 DIAGNOSIS — B353 Tinea pedis: Secondary | ICD-10-CM

## 2014-06-23 MED ORDER — GLUCOSE BLOOD VI STRP: ORAL_STRIP | Status: DC

## 2014-06-23 MED ORDER — ACCU-CHEK FASTCLIX LANCETS MISC: Status: DC

## 2014-06-23 NOTE — Assessment & Plan Note (Signed)
Result today, with recurrent severe headaches  in her very complicated medical state and polypharmacy at best to get her evaluated with a headache clinic. Refer to neurology headache clinic

## 2014-06-23 NOTE — Patient Instructions (Signed)
Great to see you!!!  Lets follow up in 1 month  Please schedule a mammogram   Diet Recommendations for Diabetes   Starchy (carb) foods include: Bread, rice, pasta, potatoes, corn, crackers, bagels, muffins, all baked goods.   Protein foods include: Meat, fish, poultry, eggs, dairy foods, and beans such as pinto and kidney beans (beans also provide carbohydrate).   1. Eat at least 3 meals and 1-2 snacks per day. Never go more than 4-5 hours while awake without eating.  2. Limit starchy foods to TWO per meal and ONE per snack. ONE portion of a starchy  food is equal to the following:   - ONE slice of bread (or its equivalent, such as half of a hamburger bun).   - 1/2 cup of a "scoopable" starchy food such as potatoes or rice.   - 1 OUNCE (28 grams) of starchy snack foods such as crackers or pretzels (look on label).   - 15 grams of carbohydrate as shown on food label.  3. Both lunch and dinner should include a protein food, a carb food, and vegetables.   - Obtain twice as many veg's as protein or carbohydrate foods for both lunch and dinner.   - Try to keep frozen veg's on hand for a quick vegetable serving.     - Fresh or frozen veg's are best.  4. Breakfast should always include protein.     Here are some potassium rich foods you can try.   Squash Sweet potato Potato, medium White beans Yogurt, fat-free Halibut, salmon 100% orange juice Broccoli Cantaloupe Banana Pork tenderloin Lentils Milk Pistachios, and other dried nuts Raisins Chicken breast Tuna, light

## 2014-06-23 NOTE — Assessment & Plan Note (Signed)
Recent eye exam, requested documentation from patient Recommended mammo Urine Microalbumin next visit

## 2014-06-23 NOTE — Assessment & Plan Note (Signed)
Pt without much improvement with creams After discussion she like to see podiatry With uncontrolled diabetes it's reasonable to send her, referral written

## 2014-06-23 NOTE — Assessment & Plan Note (Signed)
Improving clinically and by lab testing with downtrending lipase on last visit Patient tolerating PO's easier Follow-up as needed

## 2014-06-23 NOTE — Progress Notes (Signed)
Patient ID: [name] [name], female   DOB: 03/29/1951, 64 y.o.   MRN: [id number]   HPI  Patient presents today for follow-up pancreatitis  Patient has been suffering with epigastric abdominal pain with an elevation of her lipase over the last 2 weeks or so. She's been doing well at home with oxycodone and maintaining her fluids as well as taking it easy on oral food intake.  She states that today she feels much better, she's tolerating foods easier without any pain. Her diarrhea has resolved.  Diabetes Taking 40 units of Lantus twice daily, NovoLog 15 units twice daily but only when she eats States she does not have a glucose meter to take her blood sugars with that she can afford it No documented hypoglycemia, however she has somewhat frequent episodes of sweating, shaking, and feeling faint which could be attributed to hypoglycemia  Smoking Smokes occasionally "when she's stressed"  Headache Has persistent headache, was alarmingly painful on her last visit but is described as mild headache today She seems to have frequent severe headache, after discussion she would like to see a headache specialist  Smoking status noted  ROS: Per HPI  Objective: BP 153/73 mmHg  Pulse 70  Temp(Src) 98.2 F (36.8 C) (Oral)  Ht 5\' 3"  (1.6 m)  Wt 227 lb 6.4 oz (103.148 kg)  BMI 40.29 kg/m2 Gen: NAD, alert, cooperative with exam HEENT: NCAT CV: RRR, good S1/S2, no murmur Resp: CTABL, no wheezes, non-labored Abd: SNTND, BS present, no guarding or organomegaly Ext: No edema, warm Neuro: Alert and oriented, No gross deficits  Assessment and plan:  Headache Result today, with recurrent severe headaches  in her very complicated medical state and polypharmacy at best to get her evaluated with a headache clinic. Refer to neurology headache clinic    Pancreatitis, recurrent Improving clinically and by lab testing with downtrending lipase on last visit Patient tolerating PO's easier Follow-up as  needed    Tinea pedis Pt without much improvement with creams After discussion she like to see podiatry With uncontrolled diabetes it's reasonable to send her, referral written   Healthcare maintenance Recent eye exam, requested documentation from patient Recommended mammo Urine Microalbumin next visit   DM (diabetes mellitus), type 2, uncontrolled Uncontrolled, very difficult to titrate insulin as she does not have a meter Given meter in clinic today and reviewed how to use it Also given glucose logbook with instructions on how to keep track of where she is going Foot exam normal Has seen ophthalmology recently, requested that they send [place] a note    Orders Placed This Encounter  Procedures  . AMB referral to headache clinic    Referral Priority:  Routine    Referral Type:  Consultation    Number of Visits Requested:  1  . Ambulatory referral to Podiatry    Referral Priority:  Routine    Referral Type:  Consultation    Referral Reason:  Specialty Services Required    Requested Specialty:  Podiatry    Number of Visits Requested:  1    Meds ordered this encounter  Medications  . glucose blood (ACCU-CHEK AVIVA) test strip    Sig: Check sugars up to 5 times daily, fasting and 2 hours after each meal, as well as as needed with symptoms of hypoglycemia    Dispense:  150 each    Refill:  11    For questions regarding this prescription please call [phone]  . ACCU-CHEK FASTCLIX LANCETS MISC    Sig:  Check sugars up to 5 times daily, fasting and 2 hours after each meal, as well as as needed with symptoms of hypoglycemia    Dispense:  204 each    Refill:  11    Please fill quantity sufficient for 1 month

## 2014-06-23 NOTE — Assessment & Plan Note (Signed)
Uncontrolled, very difficult to titrate insulin as she does not have a meter Given meter in clinic today and reviewed how to use it Also given glucose logbook with instructions on how to keep track of where she is going Foot exam normal Has seen ophthalmology recently, requested that they send [place] a note

## 2014-07-10 ENCOUNTER — Telehealth: Payer: Self-pay | Admitting: Family Medicine

## 2014-07-10 NOTE — Telephone Encounter (Signed)
Wynonia Lawman # [id number]

## 2014-07-10 NOTE — Telephone Encounter (Signed)
[facility] called and would like a silverback for the patient. She has a appointment on 07/11/14 at 10:30 am. Please call them at [phone] with any questions. jw

## 2014-07-11 ENCOUNTER — Institutional Professional Consult (permissible substitution): Payer: Commercial Managed Care - HMO | Admitting: Neurology

## 2014-07-17 ENCOUNTER — Other Ambulatory Visit: Payer: Self-pay

## 2014-07-17 DIAGNOSIS — Z1231 Encounter for screening mammogram for malignant neoplasm of breast: Secondary | ICD-10-CM

## 2014-07-21 ENCOUNTER — Other Ambulatory Visit: Payer: Self-pay

## 2014-07-21 ENCOUNTER — Ambulatory Visit
Admission: RE | Admit: 2014-07-21 | Discharge: 2014-07-21 | Disposition: A | Discharge status: Home / Self Care | Payer: Commercial Managed Care - HMO | Source: Ambulatory Visit

## 2014-07-21 DIAGNOSIS — Z1231 Encounter for screening mammogram for malignant neoplasm of breast: Secondary | ICD-10-CM | POA: Diagnosis not present

## 2014-07-25 ENCOUNTER — Institutional Professional Consult (permissible substitution): Payer: Commercial Managed Care - HMO | Admitting: Neurology

## 2014-07-25 ENCOUNTER — Telehealth: Payer: Self-pay | Admitting: *Deleted

## 2014-07-25 ENCOUNTER — Ambulatory Visit: Payer: Commercial Managed Care - HMO | Admitting: Family Medicine

## 2014-07-25 NOTE — Telephone Encounter (Signed)
No showed her appointment today.

## 2014-07-26 ENCOUNTER — Encounter: Payer: Self-pay | Admitting: Neurology

## 2014-08-07 ENCOUNTER — Encounter: Payer: Self-pay | Admitting: Family Medicine

## 2014-08-07 ENCOUNTER — Ambulatory Visit (INDEPENDENT_AMBULATORY_CARE_PROVIDER_SITE_OTHER): Payer: Commercial Managed Care - HMO | Admitting: Family Medicine

## 2014-08-07 VITALS — BP 172/92 | HR 96 | Temp 98.6°F | Ht 63.0 in | Wt 225.7 lb

## 2014-08-07 DIAGNOSIS — E119 Type 2 diabetes mellitus without complications: Secondary | ICD-10-CM | POA: Diagnosis not present

## 2014-08-07 DIAGNOSIS — E1165 Type 2 diabetes mellitus with hyperglycemia: Secondary | ICD-10-CM | POA: Diagnosis not present

## 2014-08-07 DIAGNOSIS — R05 Cough: Secondary | ICD-10-CM

## 2014-08-07 DIAGNOSIS — I10 Essential (primary) hypertension: Secondary | ICD-10-CM | POA: Diagnosis not present

## 2014-08-07 DIAGNOSIS — IMO0002 Reserved for concepts with insufficient information to code with codable children: Secondary | ICD-10-CM

## 2014-08-07 DIAGNOSIS — R059 Cough, unspecified: Secondary | ICD-10-CM

## 2014-08-07 LAB — CBC WITH DIFFERENTIAL/PLATELET
Basophils Absolute: 0.1 10*3/uL (ref 0.0–0.1)
Basophils Relative: 1 % (ref 0–1)
Eosinophils Absolute: 0.2 10*3/uL (ref 0.0–0.7)
Eosinophils Relative: 3 % (ref 0–5)
HCT: 36.9 % (ref 36.0–46.0)
Hemoglobin: 11.8 g/dL — ABNORMAL LOW (ref 12.0–15.0)
Lymphocytes Relative: 43 % (ref 12–46)
Lymphs Abs: 2.6 10*3/uL (ref 0.7–4.0)
MCH: 24.8 pg — ABNORMAL LOW (ref 26.0–34.0)
MCHC: 32 g/dL (ref 30.0–36.0)
MCV: 77.7 fL — ABNORMAL LOW (ref 78.0–100.0)
Monocytes Absolute: 0.3 10*3/uL (ref 0.1–1.0)
Monocytes Relative: 5 % (ref 3–12)
Neutro Abs: 2.9 10*3/uL (ref 1.7–7.7)
Neutrophils Relative %: 48 % (ref 43–77)
Platelets: 168 10*3/uL (ref 150–400)
RBC: 4.75 MIL/uL (ref 3.87–5.11)
RDW: 15.9 % — ABNORMAL HIGH (ref 11.5–15.5)
WBC: 6 10*3/uL (ref 4.0–10.5)

## 2014-08-07 LAB — BASIC METABOLIC PANEL
BUN: 8 mg/dL (ref 6–23)
CO2: 29 mEq/L (ref 19–32)
Calcium: 8.7 mg/dL (ref 8.4–10.5)
Chloride: 103 mEq/L (ref 96–112)
Creat: 0.67 mg/dL (ref 0.50–1.10)
Glucose, Bld: 115 mg/dL — ABNORMAL HIGH (ref 70–99)
Potassium: 3.5 mEq/L (ref 3.5–5.3)
Sodium: 141 mEq/L (ref 135–145)

## 2014-08-07 LAB — POCT GLYCOSYLATED HEMOGLOBIN (HGB A1C): Hemoglobin A1C: 8

## 2014-08-07 MED ORDER — CEFDINIR 300 MG PO CAPS: 300.0000 mg | ORAL_CAPSULE | Freq: Two times a day (BID) | ORAL | Status: DC

## 2014-08-07 MED ORDER — AMLODIPINE BESYLATE 5 MG PO TABS: 5.0000 mg | ORAL_TABLET | Freq: Every day | ORAL | Status: DC

## 2014-08-07 NOTE — Assessment & Plan Note (Signed)
Now with recurrent illness, tretaed similarly in January of this year Previously treated with doxy Considering this pneumonia as she has 2 weeks of chills, malaise, loss of appetite, cough productive of green and brown sputum, dyspnea, and pleuritic chest pain. Considering how soon she's had a similar illness again, will consider strongly for evaluation for COPD with spirometry when she's better. Treat with Omnicef, chest x-ray as well

## 2014-08-07 NOTE — Progress Notes (Signed)
Patient ID: [name] [name], female   DOB: 12-Nov-1950, 64 y.o.   MRN: [id number]   HPI  Patient presents today for follow-up  Cough, cold, dyspnea Patient explains over the last 2 weeks she's had continued productive cough productive of green and dark green sputum as well as intermittent brown sputum. She has persistent mild dyspnea. She describes right-sided chest pain which radiates around to her back with coughing or deep inspiration. She also notes malaise, fatigue, and loss of appetite. She is still tolerating fluids easily.  Diabetes Taking 40 units of Lantus twice daily consistently Only takes 15 units of NovoLog when she eats, today she has loss of appetite and has not eaten because of her current illness No low blood sugars Not checking blood sugars due to difficulty getting strips and supplies for her meter, her insurance company has now sent her a preferred glucose meter and supplies which she can afford.  Hypertension Dyspnea and Chest pain as above, very friend and her previous heart related chest pain No palpitations, no current headache, no leg edema Taking meds regularly, not taking metoprolol, surprisingly she can list off all of her hypertension meds from memory. Checking intermittently at home and states that her blood pressure is consistently elevated to the 322G and 254Y systolic  She denies dysuria Other review of symptoms is positive for pain in bilateral feet and hands, low back pain, sensation of bladder spasms which are bothersome, loss of appetite  Smoking status noted - current smoker ROS: Per HPI  Objective: BP 172/92 mmHg  Pulse 96  Temp(Src) 98.6 F (37 C) (Oral)  Ht 5\' 3"  (1.6 m)  Wt 225 lb 11.2 oz (102.377 kg)  BMI 39.99 kg/m2 Gen: NAD, alert, cooperative with exam HEENT: NCAT CV: RRR, good S1/S2, no murmur Resp: CTABL, no wheezes, non-labored Ext: No edema, warm Neuro: Alert and oriented, No gross deficits  Assessment and  plan:  Cough Now with recurrent illness, tretaed similarly in January of this year Previously treated with doxy Considering this pneumonia as she has 2 weeks of chills, malaise, loss of appetite, cough productive of green and brown sputum, dyspnea, and pleuritic chest pain. Considering how soon she's had a similar illness again, will consider strongly for evaluation for COPD with spirometry when she's better. Treat with Omnicef, chest x-ray as well    DM (diabetes mellitus), type 2, uncontrolled Improving A1c that'll improving from10.8 to 8.9 to 8.0 today Continue current insulin regimen, 40 units of Lantus twice daily, 15 units of NovoLog 3 times daily with meals No hypoglycemic events Polypharmacy, ask her to follow-up in clinic for med rec and recommendations   HYPERTENSION, BENIGN ESSENTIAL Uncontrolled hypertension Difficult to maintain control as she is on Lasix 20 mg, HCTZ 25 mg, lisinopril 40 mg, metoprolol 100 mg, Coreg 25 twice a day, and spironolactone 50 mg Note that she's on 2 beta blockers, this was not previously known to me I would prefer that she discontinue metoprolol and discussed this with her Start low-dose amlodipine, follow-up in 7-14 days    Orders Placed This Encounter  Procedures  . DG Chest 2 View    Standing Status: Future     Number of Occurrences:      Standing Expiration Date: 10/07/2015    Order Specific Question:  Reason for Exam (SYMPTOM  OR DIAGNOSIS REQUIRED)    Answer:  rule out CAP    Order Specific Question:  Preferred imaging location?    Answer:  [facility]  .  CBC with Differential  . Basic Metabolic Panel  . POCT glycosylated hemoglobin (Hb A1C)    Meds ordered this encounter  Medications  . cefdinir (OMNICEF) 300 MG capsule    Sig: Take 1 capsule (300 mg total) by mouth 2 (two) times daily.    Dispense:  14 capsule    Refill:  0  . amLODipine (NORVASC) 5 MG tablet    Sig: Take 1 tablet (5 mg total) by mouth daily.     Dispense:  30 tablet    Refill:  3

## 2014-08-07 NOTE — Assessment & Plan Note (Signed)
Uncontrolled hypertension Difficult to maintain control as she is on Lasix 20 mg, HCTZ 25 mg, lisinopril 40 mg, metoprolol 100 mg, Coreg 25 twice a day, and spironolactone 50 mg Note that she's on 2 beta blockers, this was not previously known to me I would prefer that she discontinue metoprolol and discussed this with her Start low-dose amlodipine, follow-up in 7-14 days

## 2014-08-07 NOTE — Patient Instructions (Signed)
Great to see you today. Your diabetes is improving, great changes!  Go get a chest x-ray Take omnicef for presumed oneumonia Stop metoprolol (as long as you are on carvedilol as well) Start amlodipine  Please come back to see me in 1-2 weeks  PLease make an appointment to see the pharmacy clinic in 3-4 weeks to discuss you medications  Diet Recommendations for Diabetes   Starchy (carb) foods include: Bread, rice, pasta, potatoes, corn, crackers, bagels, muffins, all baked goods.   Protein foods include: Meat, fish, poultry, eggs, dairy foods, and beans such as pinto and kidney beans (beans also provide carbohydrate).   1. Eat at least 3 meals and 1-2 snacks per day. Never go more than 4-5 hours while awake without eating.  2. Limit starchy foods to TWO per meal and ONE per snack. ONE portion of a starchy  food is equal to the following:   - ONE slice of bread (or its equivalent, such as half of a hamburger bun).   - 1/2 cup of a "scoopable" starchy food such as potatoes or rice.   - 1 OUNCE (28 grams) of starchy snack foods such as crackers or pretzels (look on label).   - 15 grams of carbohydrate as shown on food label.  3. Both lunch and dinner should include a protein food, a carb food, and vegetables.   - Obtain twice as many veg's as protein or carbohydrate foods for both lunch and dinner.   - Try to keep frozen veg's on hand for a quick vegetable serving.     - Fresh or frozen veg's are best.  4. Breakfast should always include protein.

## 2014-08-07 NOTE — Assessment & Plan Note (Signed)
Improving A1c that'll improving from10.8 to 8.9 to 8.0 today Continue current insulin regimen, 40 units of Lantus twice daily, 15 units of NovoLog 3 times daily with meals No hypoglycemic events Polypharmacy, ask her to follow-up in clinic for med rec and recommendations

## 2014-08-08 ENCOUNTER — Encounter: Payer: Self-pay | Admitting: Family Medicine

## 2014-09-01 ENCOUNTER — Ambulatory Visit: Payer: Commercial Managed Care - HMO | Admitting: Family Medicine

## 2014-09-04 ENCOUNTER — Other Ambulatory Visit: Payer: Self-pay | Admitting: *Deleted

## 2014-09-05 MED ORDER — INSULIN PEN NEEDLE 31G X 8 MM MISC: 1.0000 | Freq: Every day | Status: DC

## 2014-09-19 ENCOUNTER — Other Ambulatory Visit: Payer: Self-pay | Admitting: *Deleted

## 2014-09-19 DIAGNOSIS — G894 Chronic pain syndrome: Secondary | ICD-10-CM

## 2014-09-20 MED ORDER — PREGABALIN 150 MG PO CAPS: 150.0000 mg | ORAL_CAPSULE | Freq: Three times a day (TID) | ORAL | Status: DC

## 2014-09-20 NOTE — Telephone Encounter (Signed)
Refilled lyrica, placed to be faxed. Will ask nursing to inform.   [facility] on [address]   Laroy Apple, MD [place] Resident, PGY-3 09/20/2014, 1:41 PM

## 2014-09-21 ENCOUNTER — Ambulatory Visit: Payer: Commercial Managed Care - HMO | Admitting: Pharmacist

## 2014-09-25 ENCOUNTER — Other Ambulatory Visit: Payer: Self-pay | Admitting: Family Medicine

## 2014-09-26 ENCOUNTER — Ambulatory Visit: Payer: Commercial Managed Care - HMO | Admitting: Pharmacist

## 2014-09-27 ENCOUNTER — Ambulatory Visit (INDEPENDENT_AMBULATORY_CARE_PROVIDER_SITE_OTHER): Payer: Medicare Other | Admitting: Family Medicine

## 2014-09-27 ENCOUNTER — Encounter: Payer: Self-pay | Admitting: Family Medicine

## 2014-09-27 VITALS — BP 156/90 | HR 108 | Temp 98.6°F | Ht 63.0 in | Wt 220.0 lb

## 2014-09-27 DIAGNOSIS — E1165 Type 2 diabetes mellitus with hyperglycemia: Secondary | ICD-10-CM | POA: Diagnosis not present

## 2014-09-27 DIAGNOSIS — G894 Chronic pain syndrome: Secondary | ICD-10-CM | POA: Diagnosis not present

## 2014-09-27 DIAGNOSIS — L299 Pruritus, unspecified: Secondary | ICD-10-CM | POA: Diagnosis not present

## 2014-09-27 DIAGNOSIS — F411 Generalized anxiety disorder: Secondary | ICD-10-CM | POA: Diagnosis not present

## 2014-09-27 DIAGNOSIS — IMO0002 Reserved for concepts with insufficient information to code with codable children: Secondary | ICD-10-CM

## 2014-09-27 DIAGNOSIS — I10 Essential (primary) hypertension: Secondary | ICD-10-CM

## 2014-09-27 DIAGNOSIS — Z79899 Other long term (current) drug therapy: Secondary | ICD-10-CM | POA: Insufficient documentation

## 2014-09-27 MED ORDER — NYSTATIN 100000 UNIT/GM EX POWD: Freq: Three times a day (TID) | CUTANEOUS | Status: DC

## 2014-09-27 MED ORDER — LISINOPRIL 40 MG PO TABS: 40.0000 mg | ORAL_TABLET | Freq: Every day | ORAL | Status: DC

## 2014-09-27 MED ORDER — METOCLOPRAMIDE HCL 10 MG PO TABS: 10.0000 mg | ORAL_TABLET | Freq: Three times a day (TID) | ORAL | Status: DC

## 2014-09-27 MED ORDER — QUETIAPINE FUMARATE 200 MG PO TABS: 100.0000 mg | ORAL_TABLET | Freq: Every day | ORAL | Status: DC

## 2014-09-27 MED ORDER — HYDROXYZINE HCL 25 MG PO TABS: 25.0000 mg | ORAL_TABLET | Freq: Three times a day (TID) | ORAL | Status: DC | PRN

## 2014-09-27 MED ORDER — PREGABALIN 150 MG PO CAPS: 150.0000 mg | ORAL_CAPSULE | Freq: Three times a day (TID) | ORAL | Status: DC

## 2014-09-27 MED ORDER — ERYTHROMYCIN STEARATE 250 MG PO TABS: 250.0000 mg | ORAL_TABLET | Freq: Three times a day (TID) | ORAL | Status: DC

## 2014-09-27 NOTE — Assessment & Plan Note (Signed)
Uncontrolled Fasting blood sugars and postprandials uncontrolled She's now on 40 units of Lantus twice daily, 20 units of NovoLog each meal, [place] consistent eating schedules I encouraged her to watch her diet, she has a lot of room for improvement there. She forgot her log today, encouraged her to follow-up with pharmacy clinic in one month and bring her log. For now continue current doses and work on diet and consistent eating

## 2014-09-27 NOTE — Progress Notes (Signed)
Patient ID: [name] [name], female   DOB: 03-Aug-1950, 64 y.o.   MRN: [id number]   HPI  Patient presents today for  Follow-up  Anxiety Patient explains several days duration of anxiety exacerbation. She states that she  Has had several life stressors that are contributing to this right now including loss of her disability check in trying to find a job.  she's tried Benadryl with no improvement She states Seroquel helps a lot.  Hypertension  no chest pain, dyspnea, palpitations, leg edema Taking medications, however she is out of spironolactone and lisinopril.  she does not check her blood pressure at home.  Diabetes Taking 40 units of Lantus twice daily , 20 units of NovoLog at each meal She does not eat consistently and sometimes eats only once daily, occasionally she does not eat a single meal requiring her NovoLog injection , rather she snacks  all day.  Itching Describes generalized itching "from the inside and the outside". Benadryl has not helped her.   Smoking status noted ROS: Per HPI  Objective: BP 156/90 mmHg  Pulse 108  Temp(Src) 98.6 F (37 C) (Oral)  Ht 5\' 3"  (1.6 m)  Wt 220 lb (99.791 kg)  BMI 38.98 kg/m2 Gen: NAD, alert, cooperative with exam HEENT: NCAT CV: RRR, good S1/S2, no murmur Resp: CTABL, no wheezes, non-labored Ext: No edema, warm Neuro: Alert and oriented, No gross deficits Psych- anxious affect  Assessment and plan:  Itching Generalized itching, no clear source Benadryl not helping Discussed risks of anticholinergics and she would like to proceed with hydroxizine (anxiety help as well)  Anxiety state  Recently she's had exacerbation ramus anxiety issues.  She states Seroquel she takes for depression with psychotic features helps a lot with anxiety. She's tried Benadryl for itching which has not helped her anxiety. Will trial hydroxyzine, given her age I cautioned her against using this 2 frequently  And discussed the side effects in  detail.  DM (diabetes mellitus), type 2, uncontrolled  Uncontrolled Fasting blood sugars and postprandials uncontrolled She's now on 40 units of Lantus twice daily, 20 units of NovoLog each meal, [place] consistent eating schedules I encouraged her to watch her diet, she has a lot of room for improvement there. She forgot her log today, encouraged her to follow-up with pharmacy clinic in one month and bring her log. For now continue current doses and work on diet and consistent eating  HYPERTENSION, BENIGN ESSENTIAL Elevated, no red flags  Initially elevated very much, recheck 156/90 Currently on spironolactone and lisinopril. She's been out of spironolactone so long that we would have to re- check her start of labs, she's not able to commit to this right now Refilled lisinopril, recheck one month at pharmacy clinic      Meds ordered this encounter  Medications  . pregabalin (LYRICA) 150 MG capsule    Sig: Take 1 capsule (150 mg total) by mouth 3 (three) times daily.    Dispense:  90 capsule    Refill:  3  . QUEtiapine (SEROQUEL) 200 MG tablet    Sig: Take 0.5 tablets (100 mg total) by mouth at bedtime.    Dispense:  30 tablet    Refill:  6  . nystatin (MYCOSTATIN) powder    Sig: Apply topically 3 (three) times daily. To affected area    Dispense:  30 g    Refill:  3  . metoCLOPramide (REGLAN) 10 MG tablet    Sig: Take 1 tablet (10 mg total) by mouth  3 (three) times daily before meals.    Dispense:  90 tablet    Refill:  3  . lisinopril (PRINIVIL,ZESTRIL) 40 MG tablet    Sig: Take 1 tablet (40 mg total) by mouth daily.    Dispense:  30 tablet    Refill:  11  . erythromycin (ERYTHROCIN STEARATE) 250 MG tablet    Sig: Take 1 tablet (250 mg total) by mouth 3 (three) times daily.    Dispense:  90 tablet    Refill:  3  . hydrOXYzine (ATARAX/VISTARIL) 25 MG tablet    Sig: Take 1 tablet (25 mg total) by mouth 3 (three) times daily as needed.    Dispense:  30 tablet     Refill:  1

## 2014-09-27 NOTE — Assessment & Plan Note (Signed)
Recently she's had exacerbation ramus anxiety issues.  She states Seroquel she takes for depression with psychotic features helps a lot with anxiety. She's tried Benadryl for itching which has not helped her anxiety. Will trial hydroxyzine, given her age I cautioned her against using this 2 frequently  And discussed the side effects in detail.

## 2014-09-27 NOTE — Assessment & Plan Note (Signed)
Generalized itching, no clear source Benadryl not helping Discussed risks of anticholinergics and she would like to proceed with hydroxizine (anxiety help as well)

## 2014-09-27 NOTE — Patient Instructions (Signed)
Great to see you!  Try the  Hydroxyzine for your itching and anxiety. Please use this sparingly.  Please come back in one month to see Dr. Valentina Lucks  Diet Recommendations for Diabetes   Starchy (carb) foods include: Bread, rice, pasta, potatoes, corn, crackers, bagels, muffins, all baked goods.   Protein foods include: Meat, fish, poultry, eggs, dairy foods, and beans such as pinto and kidney beans (beans also provide carbohydrate).   1. Eat at least 3 meals and 1-2 snacks per day. Never go more than 4-5 hours while awake without eating.  2. Limit starchy foods to TWO per meal and ONE per snack. ONE portion of a starchy  food is equal to the following:   - ONE slice of bread (or its equivalent, such as half of a hamburger bun).   - 1/2 cup of a "scoopable" starchy food such as potatoes or rice.   - 1 OUNCE (28 grams) of starchy snack foods such as crackers or pretzels (look on label).   - 15 grams of carbohydrate as shown on food label.  3. Both lunch and dinner should include a protein food, a carb food, and vegetables.   - Obtain twice as many veg's as protein or carbohydrate foods for both lunch and dinner.   - Try to keep frozen veg's on hand for a quick vegetable serving.     - Fresh or frozen veg's are best.  4. Breakfast should always include protein.

## 2014-09-27 NOTE — Assessment & Plan Note (Signed)
Elevated, no red flags  Initially elevated very much, recheck 156/90 Currently on spironolactone and lisinopril. She's been out of spironolactone so long that we would have to re- check her start of labs, she's not able to commit to this right now Refilled lisinopril, recheck one month at pharmacy clinic

## 2014-10-25 ENCOUNTER — Other Ambulatory Visit: Payer: Self-pay | Admitting: Family Medicine

## 2014-10-25 DIAGNOSIS — K3184 Gastroparesis: Principal | ICD-10-CM

## 2014-10-25 DIAGNOSIS — E1143 Type 2 diabetes mellitus with diabetic autonomic (poly)neuropathy: Secondary | ICD-10-CM

## 2014-10-26 ENCOUNTER — Encounter: Payer: Self-pay | Admitting: Pharmacist

## 2014-10-26 ENCOUNTER — Ambulatory Visit (INDEPENDENT_AMBULATORY_CARE_PROVIDER_SITE_OTHER): Payer: Medicare Other | Admitting: Pharmacist

## 2014-10-26 VITALS — BP 177/72 | HR 99 | Ht 63.0 in | Wt 223.1 lb

## 2014-10-26 DIAGNOSIS — E1169 Type 2 diabetes mellitus with other specified complication: Secondary | ICD-10-CM | POA: Insufficient documentation

## 2014-10-26 DIAGNOSIS — I10 Essential (primary) hypertension: Secondary | ICD-10-CM

## 2014-10-26 DIAGNOSIS — E1165 Type 2 diabetes mellitus with hyperglycemia: Secondary | ICD-10-CM

## 2014-10-26 DIAGNOSIS — IMO0002 Reserved for concepts with insufficient information to code with codable children: Secondary | ICD-10-CM

## 2014-10-26 DIAGNOSIS — G629 Polyneuropathy, unspecified: Secondary | ICD-10-CM

## 2014-10-26 DIAGNOSIS — K3184 Gastroparesis: Secondary | ICD-10-CM

## 2014-10-26 DIAGNOSIS — E1142 Type 2 diabetes mellitus with diabetic polyneuropathy: Secondary | ICD-10-CM

## 2014-10-26 DIAGNOSIS — E785 Hyperlipidemia, unspecified: Secondary | ICD-10-CM

## 2014-10-26 DIAGNOSIS — L301 Dyshidrosis [pompholyx]: Secondary | ICD-10-CM

## 2014-10-26 DIAGNOSIS — Z720 Tobacco use: Secondary | ICD-10-CM

## 2014-10-26 DIAGNOSIS — E1143 Type 2 diabetes mellitus with diabetic autonomic (poly)neuropathy: Secondary | ICD-10-CM

## 2014-10-26 DIAGNOSIS — E1342 Other specified diabetes mellitus with diabetic polyneuropathy: Secondary | ICD-10-CM

## 2014-10-26 MED ORDER — INSULIN GLARGINE 100 UNIT/ML SOLOSTAR PEN: 40.0000 [IU] | PEN_INJECTOR | Freq: Two times a day (BID) | SUBCUTANEOUS | Status: DC

## 2014-10-26 MED ORDER — CLOBETASOL PROPIONATE 0.05 % EX OINT: 1.0000 "application " | TOPICAL_OINTMENT | Freq: Two times a day (BID) | CUTANEOUS | Status: DC

## 2014-10-26 MED ORDER — INSULIN ASPART 100 UNIT/ML FLEXPEN: 20.0000 [IU] | PEN_INJECTOR | Freq: Three times a day (TID) | SUBCUTANEOUS | Status: DC

## 2014-10-26 MED ORDER — INSULIN LISPRO 100 UNIT/ML (KWIKPEN): 20.0000 [IU] | PEN_INJECTOR | Freq: Three times a day (TID) | SUBCUTANEOUS | Status: DC

## 2014-10-26 MED ORDER — ATORVASTATIN CALCIUM 20 MG PO TABS: 20.0000 mg | ORAL_TABLET | Freq: Every morning | ORAL | Status: DC

## 2014-10-26 MED ORDER — SPIRONOLACTONE 25 MG PO TABS: 25.0000 mg | ORAL_TABLET | Freq: Every day | ORAL | Status: DC

## 2014-10-26 NOTE — Assessment & Plan Note (Signed)
Chronic tobacco use. Remains contemplative about quitting at this time. Encouraged continued emphasis on avoiding smoking when stressed. Discussed walking to alleviate stress and agitation. She reported often walking around [facility].

## 2014-10-26 NOTE — Progress Notes (Signed)
S:    Patient arrives with lowered spirits, yet friendly and willing to conversate. Presents for diabetes type 2.  Patient reports having history of Diabetes since the year of 2011.   Patient reports adherence with medications. Current diabetes medications include Lantus 40 units BID with breakfast and dinner and Novolog 15 units TID with meals. She had ran out of Novolog for the past 3 weeks.   Patient denies hypoglycemic events.  Patient reported exercise habits: Pt goes for walks when she is stressed or agitated.  O:  . Lab Results  Component Value Date   HGBA1C 8.0 08/07/2014   BP 177/72 mmHg  Pulse 99  Ht 5\' 3"  (1.6 m)  Wt 223 lb 1.6 oz (101.197 kg)  BMI 39.53 kg/m2  Home fasting CBG: 193-250   A/P: Diabetes diagnosed in 2011 currently uncontrolled.   Denies hypoglycemic events and is able to verbalize appropriate hypoglycemia management plan.  Reports adherence with medication. Control is suboptimal due to ran out of her Novolog for the past 3 weeks, but has been consistently taking Lantus. Continued basal insulin Lantus (insulin glargine) 40 units BID with breakfast and dinner. Increased Novolog dose from 15 units to 20 units TID with each meal.  Next A1C anticipated 3 months.  Marland Kitchen  HTN was uncontrolled. BP was recorded as 177/72. Pt reports adherence to current regimen. Currently taking carvedilol 25mg  BID, furosimide 20mg  QOD, HCTZ 25mg  QAM, lisinopril 40mg  QD, and amlodipine 5mg  QD. Spironolactone 25mg  was initiated (K=3.5). HCTZ 25mg  QAM was continued and furosemide 20mg  was suggested to be decreased from QOD to PRN swelling.  Pt reported itching along arms and back for which she would take hydroxyzine or diphenhydramine. Pt reported being on Temovate ointment that had worked in the past, but she was out of it. Pt was reordered Temovate and instructed to use the ointment on arms and back if she experienced itching. Pt was also instructed to no longer use hydroxyzine or  diphenhydramine if she experienced itching.  Chronic tobacco use. Remains contemplative about quitting at this time. Encouraged continued emphasis on avoiding smoking when stressed. Discussed walking to alleviate stress and agitation. She reported often walking around [facility].  Written patient instructions provided.  Follow up in Pharmacist Clinic Visit 2 weeks.   Total time in face to face counseling 45 minutes.  Patient seen with Georgiann Mohs, PharmD Candidate.

## 2014-10-26 NOTE — Assessment & Plan Note (Addendum)
Diabetes diagnosed in 2011 currently uncontrolled.   Denies hypoglycemic events and is able to verbalize appropriate hypoglycemia management plan.  Reports adherence with medication. Control is suboptimal due to ran out of her Novolog for the past 3 weeks, but has been consistently taking Lantus. Continued basal insulin Lantus (insulin glargine) 40 units BID with breakfast and dinner. Increased Novolog dose from 15 units to 20 units TID with each meal.  Next A1C anticipated 3 months.  Marland Kitchen

## 2014-10-26 NOTE — Assessment & Plan Note (Addendum)
HTN was uncontrolled. BP was recorded as 177/72. Pt reports adherence to current regimen. Currently taking carvedilol 25mg  BID, furosimide 20mg  QOD, HCTZ 25mg  QAM, lisinopril 40mg  QD, and amlodipine 5mg  QD. Spironolactone 25mg  was initiated (K=3.5). HCTZ 25mg  QAM was continued and furosemide 20mg  was suggested to be decreased from QOD to PRN swelling.

## 2014-10-26 NOTE — Assessment & Plan Note (Signed)
Diabetes diagnosed in 2011 currently uncontrolled.   Denies hypoglycemic events and is able to verbalize appropriate hypoglycemia management plan.  Reports adherence with medication. Control is suboptimal due to ran out of her Novolog for the past 3 weeks, but has been consistently taking Lantus. Continued basal insulin Lantus (insulin glargine) 40 units BID with breakfast and dinner. Increased Novolog dose from 15 units to 20 units TID with each meal.  Next A1C anticipated 3 months.  Marland Kitchen

## 2014-10-26 NOTE — Patient Instructions (Addendum)
Continue to take your Lantus 40 units twice daily and take Novolog 20 units three times daily with each meal.  Start taking spironolactone 25mg  to help with blood pressure. Continue taking hydrochlorothiazide 25mg  every morning to help with blood pressure.  Cut back on taking furosemide (Lasix) 20mg  from every other day to as needed. Only take if you feel puffy or swollen.  If you are itching use Temovate ointment and try not to use the hydroxyzine or the Benadryl.  When you are feeling stressed go out for a walk like you have been. Follow up in 2 weeks with the pharmacy clinic. Bring all your medications with you to the visit.

## 2014-10-27 ENCOUNTER — Telehealth: Payer: Self-pay | Admitting: *Deleted

## 2014-10-27 NOTE — Telephone Encounter (Signed)
Pharmacist called stating they received a Rx for Novolog and for Humalog.  Per office from Dr. Valentina Lucks, Pt should be on Novolog 20 Units TID with meals.  Pt picked the Novolog yesterday with no co-pay.  Pharmacist was going to call patient to make sure that she will increase the Novolog 15 units to 20 Units.  Discontinue the Humalog.  Derl Barrow, RN

## 2014-10-27 NOTE — Progress Notes (Signed)
I was the preceptor for this visit. 

## 2014-11-09 ENCOUNTER — Ambulatory Visit: Payer: Commercial Managed Care - HMO | Admitting: Pharmacist

## 2014-11-17 ENCOUNTER — Encounter: Payer: Self-pay | Admitting: Family Medicine

## 2014-11-17 ENCOUNTER — Ambulatory Visit (INDEPENDENT_AMBULATORY_CARE_PROVIDER_SITE_OTHER): Payer: Medicare Other | Admitting: Family Medicine

## 2014-11-17 VITALS — BP 184/58 | HR 92 | Temp 98.9°F | Ht 63.0 in | Wt 220.0 lb

## 2014-11-17 DIAGNOSIS — K8500 Idiopathic acute pancreatitis without necrosis or infection: Secondary | ICD-10-CM

## 2014-11-17 DIAGNOSIS — E1165 Type 2 diabetes mellitus with hyperglycemia: Secondary | ICD-10-CM | POA: Diagnosis not present

## 2014-11-17 DIAGNOSIS — I10 Essential (primary) hypertension: Secondary | ICD-10-CM

## 2014-11-17 DIAGNOSIS — IMO0002 Reserved for concepts with insufficient information to code with codable children: Secondary | ICD-10-CM

## 2014-11-17 LAB — CBC
HCT: 41.6 % (ref 36.0–46.0)
Hemoglobin: 14.2 g/dL (ref 12.0–15.0)
MCH: 27.4 pg (ref 26.0–34.0)
MCHC: 34.1 g/dL (ref 30.0–36.0)
MCV: 80.2 fL (ref 78.0–100.0)
Platelets: 171 10*3/uL (ref 150–400)
RBC: 5.19 MIL/uL — ABNORMAL HIGH (ref 3.87–5.11)
RDW: 16.2 % — ABNORMAL HIGH (ref 11.5–15.5)
WBC: 8.3 10*3/uL (ref 4.0–10.5)

## 2014-11-17 LAB — POCT GLYCOSYLATED HEMOGLOBIN (HGB A1C): Hemoglobin A1C: 9.4

## 2014-11-17 MED ORDER — HYDROCODONE-ACETAMINOPHEN 5-325 MG PO TABS
1.0000 | ORAL_TABLET | ORAL | Status: DC | PRN
Start: 2014-11-17 — End: 2014-12-07

## 2014-11-17 MED ORDER — MORPHINE SULFATE 4 MG/ML IJ SOLN
4.0000 mg | Freq: Once | INTRAMUSCULAR | Status: AC
  Administered 2014-11-17: 4 mg via INTRAMUSCULAR

## 2014-11-17 MED ORDER — PROMETHAZINE HCL 12.5 MG PO TABS: 12.5000 mg | ORAL_TABLET | Freq: Three times a day (TID) | ORAL | Status: DC | PRN

## 2014-11-17 NOTE — Assessment & Plan Note (Addendum)
Worsening control. Current HgbA1c 9.4 (7/29), last HgbA1c 8.0 (07/2014). Complications with DM gastroparesis and peripheral neuropathy  Plan:  1. Continue current therapy - Lantus 40u BID, Novolog 20u TID. Patient did not bring CBG logs or meter. Given acute abdominal pain with likely pancreatitis, did not feel appropriate in adjusting insulin dosing today. 2. Lifestyle Mods - improved DM diet (dec carbs, inc vegs), exercise 3. CMET, check electrolytes 4. RTC within 1 month for re-eval DM (will need foot exam, offer PNA vaccine, f/u ophthalmology eval)

## 2014-11-17 NOTE — Assessment & Plan Note (Addendum)
Concern for acute pancreatitis in setting known chronic pancreatitis with recurrent flares, similar to current episode x 2-3 weeks now worsening. Consistent with epigastric / LUQ abd pain with radiation general abd pain and to back mostly L>R. Regarding chronic pancreatitis, pt previously has had elevated Lipase without pancreas burnout. Diff dx could include gastritis with known GERD, less likely PUD (no regular NSAIDs, h/o GI bleeding or dark stools), no evidence bowel obstruction with some diarrhea and stooling. S/p cholecystectomy. Unlikely pyelo (w/o urinary symptoms). Otherwise, differential is broad including acute appendicitis (unlikely given poor localization of pain to RLQ and tolerating some foods, duration >2 weeks). - Currently afebrile (subjective fevers), elevated BP due to pain, clinically without significant dehydration (tolerating fluids well), without active nausea / vomiting  Plan: 1. Discussed at length my insistence on patient going to the ED for further more immediate evaluation for blood work, IVF, pain control, and further diagnostics. However patient adamantly declined, and states that she has been treated for this outpatient before and would like to try that again first. Discussed with preceptor Dr. McDiarmid who agrees with this strategy. 2. S/p morphine 4mg  IM x 1 dose in clinic today (will be driven home by caregiver / friend) 3. Rx Norco 5/325 take 1-2 q 4 hr PRN pain (#30, 0 refills) 4. Rx Phenergan 12.5mg  (take 1-2 tabs q 8 hr PRN nausea (#30, 0 refills) 5. Labs - check CMET for LFTs, Lipase, LDH, CBC for eval Ranson's Criteria 6. RTC < 1 week, Mon-Tues next week for re-evaluation, if labs elevated on call provider will advise to come in sooner. Additionally, very strict return criteria given if symptoms worsen over weekend

## 2014-11-17 NOTE — Patient Instructions (Signed)
Dear [name] [name], Thank you for coming in to clinic today.  1. You most likely have an Acute Pancreatitis Flare - as we discussed I recommended that you go to the ED today, but we have reached an agreement to treat your pain / nausea and do some blood tests. If your symptoms get any worse, no longer tolerating liquids, vomiting, develop fever /sweating or worsening pain, I highly recommend that you seek immediate care in the ED. - You received morphine and given rx for Vicodin and Phenergan - Checked blood work, we may call you over the weekend if results concerning and check on you  2. Your Diabetes control has worsened. Keep taking you insulin at this time, bring logs to next visit  Some important numbers from today's visit: HgA1C - 9.4 BP 184/58 mmHg  Pulse 92  Temp(Src) 98.9 F (37.2 C) (Oral)  Ht 5\' 3"  (1.6 m)  Wt 220 lb (99.791 kg)  BMI 38.98 kg/m2  Results -  Please schedule a follow-up appointment with Dr. Parks Ranger or any available provider within 1 week to follow-up abdominal pain. Otherwise, Dr. Parks Ranger in 1 month for Diabetes follow-up  If you have any other questions or concerns, please feel free to call the clinic to contact me. You may also schedule an earlier appointment if necessary.  However, if your symptoms get significantly worse, please go to the Emergency Department to seek immediate medical attention.  [name] [name], [name]

## 2014-11-17 NOTE — Progress Notes (Signed)
   Subjective:    Patient ID: [name] [name], female    DOB: [birth year], 64 y.o.   MRN: [id number]  HPI  ABDOMINAL PAIN / ACUTE PANCREATITIS: - Known history of previous episodes of pancreatitis, prior dx chronic pancreatitis. Previously Lipase has been elevated up to 190s within past year, last check 06/2014 at 70. - Reports symptoms for past 2-3 weeks, unclear onset, gradual worsening, states she "ignored it" and "didn't want to come in, and did not want to go to the hospital". Describes recurrent abdominal upper pain mostly upper quads and epigastric with radiation across abd and back L>R, sharp stabbing, "15-20" out of 10, comparable to previous episodes. Worsening. - Tried Tylenol, Aleve without relief - Previously had Morphine shot at [facility] in past. Ran out of Vicodin rx (not regularly rx) - Reduced appetite due to pain / nausea. Staying hydrated drinking - Admits chills, headaches, nausea (without vomiting), diarrhea - Denies any dysuria, hematuria, odor, vomiting, rectal bleeding  CHRONIC DM, Type 2: Reports concern blood sugar may be elevated CBGs: Does not have CBG log or meter today, unable to recall averages Meds: Lantus 40u BID, Novolog 20u TID (increased from 15u by pharmacy clinic on 7/7) Reports good compliance with insulin. Tolerating well w/o side-effects Currently on ACEi Lifestyle: Diet (not always adherent to DM diet) / Exercise (limited) - Admits polyuria Denies hypoglycemia, visual changes, numbness or tingling.  CHRONIC HTN: Reports no concerns about BP Current Meds - Coreg 25mg  BID, HCTZ 25mg  daily, Lisinopril 40mg  daily, Amlodipine 5mg  daily. Recently pharmacy clinic visit 7/7 changed Lasix 20mg  from qod to PRN swelling and added Arlyce Harman 25mg  daily    Reports good compliance, took meds today. Tolerating well, w/o complaints. Lifestyle - limited exercise Denies CP, dyspnea, edema, dizziness / lightheadedness  I have reviewed and updated the following as  appropriate: allergies and current medications  Social Hx: - Active smoker  Review of Systems  See above HPI    Objective:   Physical Exam  BP 184/58 mmHg  Pulse 92  Temp(Src) 98.9 F (37.2 C) (Oral)  Ht 5\' 3"  (1.6 m)  Wt 220 lb (99.791 kg)  BMI 38.98 kg/m2  Gen - obese, chronically ill appearing 64 yr AAF, moderate discomfort due to abdominal pain, cooperative HEENT - NCAT with mild frontal +TTP, PERRL, EOMI, oropharynx clear, MMM Neck - supple, non-tender, no LAD Heart - RRR, no murmurs heard Lungs - CTAB, no wheezing, crackles, or rhonchi. Non-labored. Good air movement Abd - obese, soft, generalized abdominal tenderness to palpation with some localization epigastric and LUQ, non-distended, no appreciable masses, +active BS Ext - non-tender, no edema, peripheral pulses intact +2 b/l Skin - warm, dry, no rashes Neuro - awake, alert, oriented     Assessment & Plan:   See specific A&P problem list for details.

## 2014-11-17 NOTE — Assessment & Plan Note (Signed)
Uncontrolled HTN, BP today likely elevated with acute abdominal pain Meds - Coreg 25mg  BID, HCTZ 25mg  daily, Lisinopril 40mg  daily, Amlodipine 5mg  daily, Spiro 25mg  daily, Lasix 20mg  PRN (using) No complications   Plan:  1. Continue current BP meds 2. Check CMET for abd pain, but will evaluate electrolytes and K (after starting spiro) 3. Lifestyle Mods - Smoking cessation, Exercise, Dec salt intake 4. Monitor BP at home or at drug store occasionally.

## 2014-11-18 ENCOUNTER — Telehealth: Payer: Self-pay | Admitting: Family Medicine

## 2014-11-18 LAB — LIPASE: Lipase: 122 U/L — ABNORMAL HIGH (ref 0–75)

## 2014-11-18 LAB — COMPREHENSIVE METABOLIC PANEL
ALT: 19 U/L (ref 6–29)
AST: 20 U/L (ref 10–35)
Albumin: 4.4 g/dL (ref 3.6–5.1)
Alkaline Phosphatase: 85 U/L (ref 33–130)
BUN: 11 mg/dL (ref 7–25)
CO2: 23 mmol/L (ref 20–31)
Calcium: 9.7 mg/dL (ref 8.6–10.4)
Chloride: 100 mmol/L (ref 98–110)
Creat: 0.65 mg/dL (ref 0.50–0.99)
Glucose, Bld: 159 mg/dL — ABNORMAL HIGH (ref 65–99)
Potassium: 4.2 mmol/L (ref 3.5–5.3)
Sodium: 137 mmol/L (ref 135–146)
Total Bilirubin: 0.5 mg/dL (ref 0.2–1.2)
Total Protein: 7.8 g/dL (ref 6.1–8.1)

## 2014-11-18 LAB — LACTATE DEHYDROGENASE: LDH: 175 U/L (ref 94–250)

## 2014-11-18 NOTE — Telephone Encounter (Signed)
Last OV 7/29 with me for acute abdominal pain with presumed dx acute on chronic pancreatitis. See note for details. Briefly, advised that patient go directly to the ED for further lab work-up, possible imaging, and would benefit from IVF and IV pain control, however patient refused and desired to proceed with home PO meds, as she was not vomiting and able to tolerate PO, treated with Norco and Phenergan, s/p Morphine IM x 1 dose in office, advised increase hydration, and strict return criteria if worsening, follow-up recommended within 3-4 days. Labs ordered CMET, CBC, Lipase, LDH. See results below:  Results for orders placed or performed in visit on 11/17/14 (from the past 72 hour(s))  POCT A1C     Status: Abnormal   Collection Time: 11/17/14  3:20 PM  Result Value Ref Range   Hemoglobin A1C 9.4   Comprehensive metabolic panel     Status: Abnormal   Collection Time: 11/17/14  4:33 PM  Result Value Ref Range   Sodium 137 135 - 146 mmol/L   Potassium 4.2 3.5 - 5.3 mmol/L   Chloride 100 98 - 110 mmol/L   CO2 23 20 - 31 mmol/L   Glucose, Bld 159 (H) 65 - 99 mg/dL   BUN 11 7 - 25 mg/dL   Creat 0.65 0.50 - 0.99 mg/dL   Total Bilirubin 0.5 0.2 - 1.2 mg/dL   Alkaline Phosphatase 85 33 - 130 U/L   AST 20 10 - 35 U/L   ALT 19 6 - 29 U/L   Total Protein 7.8 6.1 - 8.1 g/dL   Albumin 4.4 3.6 - 5.1 g/dL   Calcium 9.7 8.6 - 10.4 mg/dL    Comment: ** Please note change in unit of measure and reference range(s). **     Lipase     Status: Abnormal   Collection Time: 11/17/14  4:33 PM  Result Value Ref Range   Lipase 122 (H) 0 - 75 U/L  Lactate Dehydrogenase     Status: None   Collection Time: 11/17/14  4:33 PM  Result Value Ref Range   LDH 175 94 - 250 U/L  CBC     Status: Abnormal   Collection Time: 11/17/14  4:33 PM  Result Value Ref Range   WBC 8.3 4.0 - 10.5 K/uL   RBC 5.19 (H) 3.87 - 5.11 MIL/uL   Hemoglobin 14.2 12.0 - 15.0 g/dL   HCT 41.6 36.0 - 46.0 %   MCV 80.2 78.0 - 100.0 fL   MCH 27.4 26.0 - 34.0 pg   MCHC 34.1 30.0 - 36.0 g/dL   RDW 16.2 (H) 11.5 - 15.5 %   Platelets 171 150 - 400 K/uL   MPV Not Performed 8.6 - 12.4 fL   Interpretation of above labs: 1. CMET - stable electrolytes, normal kidney fxn with SCr 0.65, LFTs normal 2. Lipase elevated at 122 (consistent with acute pancreatitis) 3. LDH 175 (normal) 4. CBC unremarkable, with normal WBC 8.3, stable Hgb and HCT  Calculated Ranson's Criteria Score for initial prognosis = 1 (mortality 0.9%)  Overall results are reassuring, confirming likely diagnosis of acute pancreatitis, but without any significant high risk lab abnormalities. Patient should be all right to proceed with desired outpatient therapy approach.  Attempted to call patient to provide the above results, unable to reach patient or active voicemail designated for the patient.  [name], [place], PGY-3

## 2014-11-20 NOTE — Telephone Encounter (Signed)
Addendum to below phone note 7/30. Updated 11/20/14. Called patient's contact # to review lab results. Patient's granddaughter picked up and acknowledges that she is one of the primary caregivers for her and would take the message. I reviewed the below lab results, essentially with confirmed diagnosis acute pancreatitis with elevated lipase 122, otherwise other labs reassuring without concerns. Family reported that she seems to be doing better since Friday, resting at home, drinking plenty of fluids, broth, and tolerating medications. No new concerns. I advised her that it is recommended to return for follow-up for this issue within 1 week for re-evaluation at [facility], otherwise if significant worsening same return criteria given to go directly to [facility] for more emergent treatment. Understood, questions answered, will relay message to patient.  [name], [place], PGY-3

## 2014-11-26 ENCOUNTER — Emergency Department (HOSPITAL_COMMUNITY): Payer: Medicare Other

## 2014-11-26 ENCOUNTER — Encounter (HOSPITAL_COMMUNITY): Payer: Self-pay | Admitting: *Deleted

## 2014-11-26 ENCOUNTER — Emergency Department (HOSPITAL_COMMUNITY)
Admission: EM | Admit: 2014-11-26 | Discharge: 2014-11-26 | Disposition: A | Discharge status: Home / Self Care | Payer: Medicare Other | Attending: Emergency Medicine | Admitting: Emergency Medicine

## 2014-11-26 DIAGNOSIS — N3010 Interstitial cystitis (chronic) without hematuria: Secondary | ICD-10-CM | POA: Diagnosis not present

## 2014-11-26 DIAGNOSIS — J45901 Unspecified asthma with (acute) exacerbation: Secondary | ICD-10-CM | POA: Diagnosis not present

## 2014-11-26 DIAGNOSIS — Z720 Tobacco use: Secondary | ICD-10-CM | POA: Insufficient documentation

## 2014-11-26 DIAGNOSIS — Z8673 Personal history of transient ischemic attack (TIA), and cerebral infarction without residual deficits: Secondary | ICD-10-CM | POA: Diagnosis not present

## 2014-11-26 DIAGNOSIS — Z7951 Long term (current) use of inhaled steroids: Secondary | ICD-10-CM | POA: Diagnosis not present

## 2014-11-26 DIAGNOSIS — Z792 Long term (current) use of antibiotics: Secondary | ICD-10-CM | POA: Insufficient documentation

## 2014-11-26 DIAGNOSIS — F329 Major depressive disorder, single episode, unspecified: Secondary | ICD-10-CM | POA: Diagnosis not present

## 2014-11-26 DIAGNOSIS — M1990 Unspecified osteoarthritis, unspecified site: Secondary | ICD-10-CM | POA: Insufficient documentation

## 2014-11-26 DIAGNOSIS — F419 Anxiety disorder, unspecified: Secondary | ICD-10-CM | POA: Diagnosis not present

## 2014-11-26 DIAGNOSIS — Z7952 Long term (current) use of systemic steroids: Secondary | ICD-10-CM | POA: Diagnosis not present

## 2014-11-26 DIAGNOSIS — K219 Gastro-esophageal reflux disease without esophagitis: Secondary | ICD-10-CM

## 2014-11-26 DIAGNOSIS — Z8719 Personal history of other diseases of the digestive system: Secondary | ICD-10-CM

## 2014-11-26 DIAGNOSIS — R079 Chest pain, unspecified: Secondary | ICD-10-CM | POA: Diagnosis present

## 2014-11-26 DIAGNOSIS — Z91040 Latex allergy status: Secondary | ICD-10-CM | POA: Insufficient documentation

## 2014-11-26 DIAGNOSIS — Z872 Personal history of diseases of the skin and subcutaneous tissue: Secondary | ICD-10-CM | POA: Insufficient documentation

## 2014-11-26 DIAGNOSIS — E119 Type 2 diabetes mellitus without complications: Secondary | ICD-10-CM | POA: Insufficient documentation

## 2014-11-26 DIAGNOSIS — R109 Unspecified abdominal pain: Secondary | ICD-10-CM

## 2014-11-26 DIAGNOSIS — G894 Chronic pain syndrome: Secondary | ICD-10-CM

## 2014-11-26 DIAGNOSIS — Z862 Personal history of diseases of the blood and blood-forming organs and certain disorders involving the immune mechanism: Secondary | ICD-10-CM | POA: Insufficient documentation

## 2014-11-26 DIAGNOSIS — R011 Cardiac murmur, unspecified: Secondary | ICD-10-CM | POA: Insufficient documentation

## 2014-11-26 DIAGNOSIS — K5900 Constipation, unspecified: Secondary | ICD-10-CM | POA: Insufficient documentation

## 2014-11-26 DIAGNOSIS — Z79899 Other long term (current) drug therapy: Secondary | ICD-10-CM | POA: Insufficient documentation

## 2014-11-26 DIAGNOSIS — Z7982 Long term (current) use of aspirin: Secondary | ICD-10-CM | POA: Insufficient documentation

## 2014-11-26 DIAGNOSIS — Z8701 Personal history of pneumonia (recurrent): Secondary | ICD-10-CM | POA: Diagnosis not present

## 2014-11-26 DIAGNOSIS — R0789 Other chest pain: Secondary | ICD-10-CM

## 2014-11-26 DIAGNOSIS — Z9049 Acquired absence of other specified parts of digestive tract: Secondary | ICD-10-CM

## 2014-11-26 DIAGNOSIS — I10 Essential (primary) hypertension: Secondary | ICD-10-CM | POA: Diagnosis not present

## 2014-11-26 DIAGNOSIS — R1013 Epigastric pain: Secondary | ICD-10-CM

## 2014-11-26 DIAGNOSIS — Z794 Long term (current) use of insulin: Secondary | ICD-10-CM | POA: Insufficient documentation

## 2014-11-26 LAB — URINALYSIS, ROUTINE W REFLEX MICROSCOPIC
Bilirubin Urine: NEGATIVE
Glucose, UA: NEGATIVE mg/dL
Hgb urine dipstick: NEGATIVE
Ketones, ur: NEGATIVE mg/dL
Leukocytes, UA: NEGATIVE
Nitrite: NEGATIVE
Protein, ur: NEGATIVE mg/dL
Specific Gravity, Urine: 1.009 (ref 1.005–1.030)
Urobilinogen, UA: 0.2 mg/dL (ref 0.0–1.0)
pH: 5.5 (ref 5.0–8.0)

## 2014-11-26 LAB — BASIC METABOLIC PANEL
Anion gap: 9 (ref 5–15)
BUN: 5 mg/dL — ABNORMAL LOW (ref 6–20)
CO2: 28 mmol/L (ref 22–32)
Calcium: 9 mg/dL (ref 8.9–10.3)
Chloride: 103 mmol/L (ref 101–111)
Creatinine, Ser: 0.79 mg/dL (ref 0.44–1.00)
GFR calc Af Amer: >60 mL/min (ref >60)
GFR calc non Af Amer: >60 mL/min (ref >60)
Glucose, Bld: 127 mg/dL — ABNORMAL HIGH (ref 65–99)
Potassium: 3.9 mmol/L (ref 3.5–5.1)
Sodium: 140 mmol/L (ref 135–145)

## 2014-11-26 LAB — I-STAT TROPONIN, ED
Troponin i, poc: 0.02 ng/mL (ref 0.00–0.08)
Troponin i, poc: 0.01 ng/mL (ref 0.00–0.08)

## 2014-11-26 LAB — HEPATIC FUNCTION PANEL
ALT: 18 U/L (ref 14–54)
AST: 30 U/L (ref 15–41)
Albumin: 3.9 g/dL (ref 3.5–5.0)
Alkaline Phosphatase: 82 U/L (ref 38–126)
Bilirubin, Direct: <0.1 mg/dL — ABNORMAL LOW (ref 0.1–0.5)
Total Bilirubin: 0.4 mg/dL (ref 0.3–1.2)
Total Protein: 7.7 g/dL (ref 6.5–8.1)

## 2014-11-26 LAB — CBC
HCT: 41.7 % (ref 36.0–46.0)
Hemoglobin: 14 g/dL (ref 12.0–15.0)
MCH: 28.1 pg (ref 26.0–34.0)
MCHC: 33.6 g/dL (ref 30.0–36.0)
MCV: 83.6 fL (ref 78.0–100.0)
Platelets: 161 10*3/uL (ref 150–400)
RBC: 4.99 MIL/uL (ref 3.87–5.11)
RDW: 15.1 % (ref 11.5–15.5)
WBC: 6.7 10*3/uL (ref 4.0–10.5)

## 2014-11-26 LAB — LIPASE, BLOOD: Lipase: 32 U/L (ref 22–51)

## 2014-11-26 MED ORDER — PANTOPRAZOLE SODIUM 40 MG PO TBEC
40.0000 mg | DELAYED_RELEASE_TABLET | Freq: Once | ORAL | Status: AC
  Administered 2014-11-26: 40 mg via ORAL
  Filled 2014-11-26: qty 1

## 2014-11-26 MED ORDER — MORPHINE SULFATE 4 MG/ML IJ SOLN
4.0000 mg | Freq: Once | INTRAMUSCULAR | Status: AC
  Administered 2014-11-26: 4 mg via INTRAVENOUS
  Filled 2014-11-26: qty 1

## 2014-11-26 MED ORDER — ONDANSETRON HCL 4 MG/2ML IJ SOLN
4.0000 mg | Freq: Once | INTRAMUSCULAR | Status: AC
  Administered 2014-11-26: 4 mg via INTRAVENOUS
  Filled 2014-11-26: qty 2

## 2014-11-26 MED ORDER — HYDROMORPHONE HCL 1 MG/ML IJ SOLN
1.0000 mg | Freq: Once | INTRAMUSCULAR | Status: AC
  Administered 2014-11-26: 1 mg via INTRAVENOUS
  Filled 2014-11-26: qty 1

## 2014-11-26 MED ORDER — ONDANSETRON 4 MG PO TBDP: ORAL_TABLET | ORAL | Status: DC

## 2014-11-26 MED ORDER — ALBUTEROL SULFATE (2.5 MG/3ML) 0.083% IN NEBU
5.0000 mg | INHALATION_SOLUTION | Freq: Once | RESPIRATORY_TRACT | Status: AC
  Administered 2014-11-26: 5 mg via RESPIRATORY_TRACT
  Filled 2014-11-26: qty 6

## 2014-11-26 MED ORDER — OMEPRAZOLE 20 MG PO CPDR: 20.0000 mg | DELAYED_RELEASE_CAPSULE | Freq: Every day | ORAL | Status: DC

## 2014-11-26 MED ORDER — OXYCODONE-ACETAMINOPHEN 5-325 MG PO TABS: 1.0000 | ORAL_TABLET | ORAL | Status: DC | PRN

## 2014-11-26 MED ORDER — SODIUM CHLORIDE 0.9 % IV BOLUS (SEPSIS)
1000.0000 mL | Freq: Once | INTRAVENOUS | Status: AC
  Administered 2014-11-26: 1000 mL via INTRAVENOUS

## 2014-11-26 NOTE — ED Notes (Signed)
Attempted to draw pts labs was unsuccessful  

## 2014-11-26 NOTE — Discharge Instructions (Signed)
1. Medications: zofran, vicodin, omeprazole, usual home medications 2. Treatment: rest, drink plenty of fluids, advance diet slowly 3. Follow Up: Please followup with your primary doctor in 1 days for discussion of your diagnoses and further evaluation after today's visit; if you do not have a primary care doctor use the resource guide provided to find one; Please return to the ER for persistent vomiting, high fevers or worsening symptoms     Abdominal Pain Many things can cause abdominal pain. Usually, abdominal pain is not caused by a disease and will improve without treatment. It can often be observed and treated at home. Your health care provider will do a physical exam and possibly order blood tests and X-rays to help determine the seriousness of your pain. However, in many cases, more time must pass before a clear cause of the pain can be found. Before that point, your health care provider may not know if you need more testing or further treatment. HOME CARE INSTRUCTIONS  Monitor your abdominal pain for any changes. The following actions may help to alleviate any discomfort you are experiencing:  Only take over-the-counter or prescription medicines as directed by your health care provider.  Do not take laxatives unless directed to do so by your health care provider.  Try a clear liquid diet (broth, tea, or water) as directed by your health care provider. Slowly move to a bland diet as tolerated. SEEK MEDICAL CARE IF:  You have unexplained abdominal pain.  You have abdominal pain associated with nausea or diarrhea.  You have pain when you urinate or have a bowel movement.  You experience abdominal pain that wakes you in the night.  You have abdominal pain that is worsened or improved by eating food.  You have abdominal pain that is worsened with eating fatty foods.  You have a fever. SEEK IMMEDIATE MEDICAL CARE IF:   Your pain does not go away within 2 hours.  You keep  throwing up (vomiting).  Your pain is felt only in portions of the abdomen, such as the right side or the left lower portion of the abdomen.  You pass bloody or black tarry stools. MAKE SURE YOU:  Understand these instructions.   Will watch your condition.   Will get help right away if you are not doing well or get worse.  Document Released: 01/15/2005 Document Revised: 04/12/2013 Document Reviewed: 12/15/2012 [employer] Patient Information 2015 [place], [place]. This information is not intended to replace advice given to you by your health care provider. Make sure you discuss any questions you have with your health care provider.

## 2014-11-26 NOTE — ED Notes (Signed)
Pt reports having sharp stabbing pains to chest, back and abd. Hx of pancreatitis. Also having sob, her pain increases with breathing. ekg done and airway intact.
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
## 2014-12-07 ENCOUNTER — Encounter: Payer: Self-pay | Admitting: Family Medicine

## 2014-12-07 ENCOUNTER — Ambulatory Visit (INDEPENDENT_AMBULATORY_CARE_PROVIDER_SITE_OTHER): Payer: Medicare Other | Admitting: Family Medicine

## 2014-12-07 VITALS — BP 165/74 | HR 90 | Temp 98.9°F | Ht 63.0 in | Wt 221.0 lb

## 2014-12-07 DIAGNOSIS — K861 Other chronic pancreatitis: Secondary | ICD-10-CM

## 2014-12-07 DIAGNOSIS — R1012 Left upper quadrant pain: Secondary | ICD-10-CM

## 2014-12-07 DIAGNOSIS — R110 Nausea: Secondary | ICD-10-CM

## 2014-12-07 DIAGNOSIS — K859 Acute pancreatitis without necrosis or infection, unspecified: Secondary | ICD-10-CM

## 2014-12-07 MED ORDER — ONDANSETRON 4 MG PO TBDP: 4.0000 mg | ORAL_TABLET | Freq: Three times a day (TID) | ORAL | Status: DC | PRN

## 2014-12-07 MED ORDER — OXYCODONE-ACETAMINOPHEN 5-325 MG PO TABS: 1.0000 | ORAL_TABLET | Freq: Four times a day (QID) | ORAL | Status: DC | PRN

## 2014-12-07 MED ORDER — MORPHINE SULFATE (PF) 4 MG/ML IV SOLN: 1.0000 mg | Freq: Once | INTRAVENOUS | Status: DC

## 2014-12-07 MED ORDER — MORPHINE SULFATE (PF) 4 MG/ML IV SOLN
4.0000 mg | Freq: Once | INTRAVENOUS | Status: AC
  Administered 2014-12-07: 4 mg via INTRAMUSCULAR

## 2014-12-07 NOTE — Patient Instructions (Signed)
Dear [name] [name], Thank you for coming in to clinic today.  1. You most likely have an Acute Pancreatitis Flare -   If your symptoms get any worse, no longer tolerating liquids, vomiting, develop fever /sweating or worsening pain, I highly recommend that you seek immediate care in the ED. - You received morphine and given rx for Percocet and Zofran   BP 165/74 mmHg  Pulse 90  Temp(Src) 98.9 F (37.2 C) (Oral)  Ht 5\' 3"  (1.6 m)  Wt 221 lb (100.245 kg)  BMI 39.16 kg/m2  Results -  Please schedule a follow-up appointment with Dr. Parks Ranger or any available provider within 1-2 weeks. Follow-up Diabetes within 3 months  If you have any other questions or concerns, please feel free to call the clinic to contact me. You may also schedule an earlier appointment if necessary.  However, if your symptoms get significantly worse, please go to the Emergency Department to seek immediate medical attention.  [name] [name], [name]

## 2014-12-07 NOTE — Assessment & Plan Note (Signed)
Consistent with recurrent acute on chronic pancreatitis, likely unresolved from prior acute flare dx 7/29 with elevated lipase to 122, however has downtrended to 32 last ED visit 11/26/14. Unchanged symptoms LUQ / epigastric pain, radiating to pain, n/v, not tolerating solid PO. ED work-up without other concerns. No new symptoms today. - Currently afebrile, clinically without significant dehydration (tolerating fluids well), without active nausea / vomiting  Plan: 1. Discussed the difficulty in managing her current prolonged pancreatitis episode intermittently in clinic, however seems like she is trying to improve hydration at home. 2. Given Morphine 4mg  IM x 1 dose in clinic (driven home by caregiver) 3. New rx Percocet 5/325 - take 1 q 6 hr PRN (#30) 4. Refilled Zofran 4mg  ODT take 1 q 8 hr PRN (#30, 0 refills) - advised to call if not helping, can switch to PO and may try Phenergan again 5. No labs today given no new concerns. Clinically consistent with her chronic pancreatitis, and would not change management. 6. RTC 1-2 weeks follow-up abdominal pain, understands when to go to ED
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
## 2014-12-22 ENCOUNTER — Other Ambulatory Visit: Payer: Self-pay | Admitting: *Deleted

## 2014-12-22 DIAGNOSIS — I10 Essential (primary) hypertension: Secondary | ICD-10-CM

## 2014-12-22 MED ORDER — AMLODIPINE BESYLATE 5 MG PO TABS: 5.0000 mg | ORAL_TABLET | Freq: Every day | ORAL | Status: DC

## 2014-12-26 ENCOUNTER — Ambulatory Visit: Payer: Medicare Other | Admitting: Family Medicine

## 2015-01-25 ENCOUNTER — Ambulatory Visit (INDEPENDENT_AMBULATORY_CARE_PROVIDER_SITE_OTHER): Payer: Medicare Other | Admitting: Pharmacist

## 2015-01-25 ENCOUNTER — Ambulatory Visit (INDEPENDENT_AMBULATORY_CARE_PROVIDER_SITE_OTHER): Payer: Medicare Other | Admitting: Family Medicine

## 2015-01-25 ENCOUNTER — Encounter: Payer: Self-pay | Admitting: Pharmacist

## 2015-01-25 ENCOUNTER — Encounter: Payer: Self-pay | Admitting: Family Medicine

## 2015-01-25 VITALS — BP 140/58 | Wt 222.6 lb

## 2015-01-25 VITALS — BP 140/58 | HR 84 | Wt 222.0 lb

## 2015-01-25 DIAGNOSIS — G894 Chronic pain syndrome: Secondary | ICD-10-CM

## 2015-01-25 DIAGNOSIS — E1169 Type 2 diabetes mellitus with other specified complication: Secondary | ICD-10-CM | POA: Diagnosis not present

## 2015-01-25 DIAGNOSIS — Z23 Encounter for immunization: Secondary | ICD-10-CM

## 2015-01-25 DIAGNOSIS — K219 Gastro-esophageal reflux disease without esophagitis: Secondary | ICD-10-CM

## 2015-01-25 DIAGNOSIS — E785 Hyperlipidemia, unspecified: Secondary | ICD-10-CM

## 2015-01-25 DIAGNOSIS — IMO0002 Reserved for concepts with insufficient information to code with codable children: Secondary | ICD-10-CM

## 2015-01-25 DIAGNOSIS — M797 Fibromyalgia: Secondary | ICD-10-CM

## 2015-01-25 DIAGNOSIS — E1165 Type 2 diabetes mellitus with hyperglycemia: Secondary | ICD-10-CM

## 2015-01-25 DIAGNOSIS — E1142 Type 2 diabetes mellitus with diabetic polyneuropathy: Secondary | ICD-10-CM

## 2015-01-25 DIAGNOSIS — E1100 Type 2 diabetes mellitus with hyperosmolarity without nonketotic hyperglycemic-hyperosmolar coma (NKHHC): Secondary | ICD-10-CM

## 2015-01-25 DIAGNOSIS — I10 Essential (primary) hypertension: Secondary | ICD-10-CM

## 2015-01-25 DIAGNOSIS — L299 Pruritus, unspecified: Secondary | ICD-10-CM

## 2015-01-25 LAB — POCT GLYCOSYLATED HEMOGLOBIN (HGB A1C): Hemoglobin A1C: 8

## 2015-01-25 MED ORDER — RANITIDINE HCL 150 MG PO TABS
150.0000 mg | ORAL_TABLET | Freq: Every day | ORAL | Status: DC | PRN
Start: 2015-01-25 — End: 2016-07-08

## 2015-01-25 MED ORDER — PENTOSAN POLYSULFATE SODIUM 100 MG PO CAPS: 100.0000 mg | ORAL_CAPSULE | Freq: Three times a day (TID) | ORAL | Status: DC

## 2015-01-25 MED ORDER — INSULIN GLARGINE 100 UNIT/ML SOLOSTAR PEN: 40.0000 [IU] | PEN_INJECTOR | Freq: Every day | SUBCUTANEOUS | Status: DC

## 2015-01-25 MED ORDER — HYDROXYZINE HCL 25 MG PO TABS: 25.0000 mg | ORAL_TABLET | Freq: Three times a day (TID) | ORAL | Status: DC | PRN

## 2015-01-25 MED ORDER — GLUCOSE BLOOD VI STRP: ORAL_STRIP | Status: DC

## 2015-01-25 MED ORDER — HYDROCHLOROTHIAZIDE 25 MG PO TABS: 25.0000 mg | ORAL_TABLET | Freq: Every morning | ORAL | Status: DC

## 2015-01-25 MED ORDER — PREGABALIN 150 MG PO CAPS: ORAL_CAPSULE | ORAL | Status: DC

## 2015-01-25 MED ORDER — OMEPRAZOLE 20 MG PO CPDR: 20.0000 mg | DELAYED_RELEASE_CAPSULE | Freq: Every day | ORAL | Status: DC

## 2015-01-25 MED ORDER — PREGABALIN 150 MG PO CAPS: 150.0000 mg | ORAL_CAPSULE | Freq: Three times a day (TID) | ORAL | Status: DC

## 2015-01-25 MED ORDER — ACCU-CHEK AVIVA PLUS W/DEVICE KIT: 1.0000 | PACK | Freq: Once | Status: DC

## 2015-01-25 NOTE — Patient Instructions (Signed)
Follow up with Dr. Parks Ranger   NO change in diabetes or hypertension meds today.

## 2015-01-25 NOTE — Progress Notes (Signed)
S:    Patient arrives in good spirits. She presents for med review AND follow up of Diabetes, Hypertension and asthma.    Patient reports adherence with medications. However she has run out of several medications and requests new prescriptions for omeprazole, ranitidine, HCTZ, elmiron and hydroxyzine.  Current diabetes medications include HUMALOG 15 units with two meals daily and Lantus 40 units ONCE daily.  Reported that her blood glucose meter readings have all been < 200 recently.   However, her meter is now broken and she requests a new glucometer.   Patient denies hypoglycemic events.  However has had few episodes of sweating that are concerning.   She again did not check her readings as her meter is "broken"  Patient reported dietary habits:  Eating less as her belly pain / gastroparesis is problematic.   Continues to smoke 3 cigarettes per day.   All medications were reviewed and she was found to be out of multiple medications.   O:  Lab Results  Component Value Date   HGBA1C 8.0 01/25/2015    Home fasting CBG: < 200   A/P: Diabetes currently under fairly good control  Based on A1c today of 8.0   This has occurred with lower doses of insulin likely related to decreased PO intake.   Patient denies hypoglycemic events and is able to verbalize appropriate hypoglycemia management plan.  Patient reports adherence with medication however she has been taking lower doses of insulin than prescribed.   Continue Lantus 40 units ONCE daily and Humalog 15 units prior to meals.  Sample of Lantus Solostar provided - #1.    Blood pressure in office today at goal and no changes suggested at this time.   Written patient instructions provided.  Total time in face to face counseling 25 minutes.   Follow up in Pharmacist Clinic Visit prn.   Patient seen with Dimitri Ped, PharmD Resident.and Elisabeth Most, PharmD, resident.  Marland Kitchen

## 2015-01-25 NOTE — Assessment & Plan Note (Addendum)
Diabetes currently under fairly good control  Based on A1c today of 8.0   This has occurred with lower doses of insulin likely related to decreased PO intake.   Patient denies hypoglycemic events and is able to verbalize appropriate hypoglycemia management plan.  Patient reports adherence with medication however she has been taking lower doses of insulin than prescribed.   Continue Lantus 40 units ONCE daily and Humalog 15 units prior to meals Agree, [place]

## 2015-01-25 NOTE — Progress Notes (Signed)
Patient ID: [name] [name], female   DOB: Aug 11, 1950, 64 y.o.   MRN: [id number] Reviewed: Agree with Dr. Graylin Shiver documentation and management.
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
## 2015-01-25 NOTE — Patient Instructions (Signed)
Dear [name], Thank you for coming in to clinic today.  1. For your Diabetes - you are doing much better, your A1c is down to 8.0, keep working on improving your diet, reduce carbs - take insulin as instructed by Dr. Valentina Lucks  - Call our office leave a detailed message with nurse with your brand of Glucometer and strips and supplies  2. For your Neuropathy and Chronic Pain - Increase Lyrica to 1 (150mg ) tablet in morning and afternoon, and 2 tablets at night (300mg ), try this for about 1 month - Next visit we will discuss adding an alternative medicine, and can discuss Percocet  Please schedule a follow-up appointment with Dr. Parks Ranger in 1 month for chronic pain  If you have any other questions or concerns, please feel free to call the clinic to contact me. You may also schedule an earlier appointment if necessary.  However, if your symptoms get significantly worse, please go to the Emergency Department to seek immediate medical attention.  [name], [place]

## 2015-01-26 ENCOUNTER — Telehealth: Payer: Self-pay | Admitting: Family Medicine

## 2015-01-26 NOTE — Assessment & Plan Note (Signed)
Chronic pain syndrome seems consistent with multiple causes primarily fibromyalgia but also abd pain with DM gastroparesis and recurrent pancreatitis, also peripheral neuropathy. - Improved on Lyrica 150mg  TID, since 2015  Plan: 1. Trial on slightly elevated dose at 150 AM / afternoon and 300mg  qhs, max dose 600mg  (no evidence for significant benefit), trial for 2 to 4 weeks, then re-evaluate, if not improved recommend return to TID dosing and consider alternative agent, such as cymbalta 2. Recommend against starting chronic opiate narcotics especially with DM gastroparesis

## 2015-01-26 NOTE — Telephone Encounter (Signed)
LMOVM. Please advise patient she has her annual DM eye exam scheduled for:  Tuesday, February 13, 2015 at 1:15 PM with Dr. Posey Pronto.   [facility] [address] [place], [place] [address] [phone] (phone)  I was also be sending a letter with this info to patient's home address.

## 2015-01-26 NOTE — Assessment & Plan Note (Signed)
Improved BP today, previous and prior recent [facility] records of SBP >150 to 170 likely due to abdominal pain Meds - Coreg 25mg  BID, HCTZ 25mg  daily, Lisinopril 40mg  daily, Amlodipine 5mg  daily, Spiro 25mg  daily, Lasix 20mg  PRN (using) No complications   Plan:  1. Continue current BP meds 2. Not due for labs 3. Lifestyle Mods - Smoking cessation, Exercise, Dec salt intake 4. Monitor BP at home or at drug store occasionally.

## 2015-01-26 NOTE — Assessment & Plan Note (Signed)
Persistent with some worsening - DM Foot exam today with some decreased sensation  Plan: 1. Increase Lyrica to max dose 150 AM / 150 afternoon and 300mg  qhs for 2-4 week trial 2. Consider alternative agents in future

## 2015-01-26 NOTE — Assessment & Plan Note (Addendum)
Improved moderate control, agree with pharmacy clinic note - likely from reduced PO intake with fewer meals daily due to chronic abd pain with gastroparesis and chronic recurrent pancreatitis. Current HgbA1c 8.0 (10/6), last HgbA1c 9.4 (11/17/14). Complications with peripheral neuropathy, DM gastroparesis No hypoglycemia - Due for ophtho exam - DM Foot exam today with some L>R decreased sensation  Plan:  1. Continue current therapy - Lantus 40u daily, Humalog 15 BID-TID with meals 2. Lifestyle Mods - Stressed importance of improved DM diet (dec carbs, inc vegs), given limited exercise with reduced mobility and chronic pain 3. Due for lipid check, will advised patient to return for fasting lab draw for lipid panel 4. Continue ASA and statin therapy 5. Referral to Ophtho - annual DM eye exam 6. RTC 3 mo for A1c, glucometer was sent in today by Dr. Andria Frames

## 2015-01-26 NOTE — Telephone Encounter (Signed)
Called patient. Reminded her of the below ophtho appointment for annual DM eye exam. She will establish with Dr. Posey Pronto on 02/13/15 at 1:15pm, provided her address to Dr Bing Plume office and phone number. Additionally, reminded her of due fasting lipid panel, future order placed in epic, she needs to schedule Lab Only apt and arrive fasting in AM anytime in next few weeks.  [name], [place], PGY-3

## 2015-02-07 ENCOUNTER — Telehealth: Payer: Self-pay | Admitting: Family Medicine

## 2015-02-07 NOTE — Telephone Encounter (Signed)
Pt is calling because her insurance needs prior authorization for her Lyrica. The pharmacy is will be sending [place] fax so that we can complete this. jw

## 2015-02-07 NOTE — Telephone Encounter (Signed)
Prior authorization for Lyrica placed in provider box for review.  Derl Barrow, RN

## 2015-02-08 NOTE — Telephone Encounter (Signed)
Completed PA for: Lyrica 150mg  capsules - dose 150mg  AM, 150mg  afternoon, 300mg  nightly (total daily dose 4 capsules, 600mg ) increased from Lyrica 150mg  TID Indication / Diagnosis: Fibromyalgia, Peripheral Neuropathy (2/2 DM2) Date initiated therapy: 01/25/15 Rationale of Request: Trial on increased dose to see if effective, otherwise future 1 month follow-up plan to return to prior standard dosing Lyrica 150mg  TID  Returned to Latina Craver, Therapist, sports.  [name], [place], PGY-2

## 2015-02-09 NOTE — Telephone Encounter (Signed)
PA form faxed to [facility] for review.  Review process could take 24-72 hours to complete.  Lacrystal Barbe L, RN  

## 2015-02-12 NOTE — Telephone Encounter (Signed)
PA for Lyrica (#4 capsules per day) approved until 04/20/2016.  [place] and Patient are aware of the approval.  Reference number: [id number]. Derl Barrow, RN

## 2015-02-22 ENCOUNTER — Telehealth: Payer: Self-pay | Admitting: Family Medicine

## 2015-02-22 DIAGNOSIS — K3184 Gastroparesis: Principal | ICD-10-CM

## 2015-02-22 DIAGNOSIS — E1143 Type 2 diabetes mellitus with diabetic autonomic (poly)neuropathy: Secondary | ICD-10-CM

## 2015-02-22 NOTE — Telephone Encounter (Signed)
Requesting refill for erythromycin, pt goes to [facility]/ [address], is completely out

## 2015-02-23 MED ORDER — ERYTHROMYCIN STEARATE 250 MG PO TABS: 250.0000 mg | ORAL_TABLET | Freq: Three times a day (TID) | ORAL | Status: DC

## 2015-02-23 NOTE — Telephone Encounter (Signed)
Refilled Erythromycin for DM gastroparesis. Sent to [facility].  [name], [place], PGY-3

## 2015-04-04 ENCOUNTER — Encounter: Payer: Self-pay | Admitting: Family Medicine

## 2015-04-04 ENCOUNTER — Ambulatory Visit (INDEPENDENT_AMBULATORY_CARE_PROVIDER_SITE_OTHER): Payer: Medicare Other | Admitting: Family Medicine

## 2015-04-04 VITALS — BP 178/84 | HR 104 | Temp 99.1°F | Ht 63.0 in | Wt 223.0 lb

## 2015-04-04 DIAGNOSIS — J189 Pneumonia, unspecified organism: Secondary | ICD-10-CM

## 2015-04-04 DIAGNOSIS — E1100 Type 2 diabetes mellitus with hyperosmolarity without nonketotic hyperglycemic-hyperosmolar coma (NKHHC): Secondary | ICD-10-CM | POA: Diagnosis not present

## 2015-04-04 DIAGNOSIS — G894 Chronic pain syndrome: Secondary | ICD-10-CM

## 2015-04-04 DIAGNOSIS — M797 Fibromyalgia: Secondary | ICD-10-CM

## 2015-04-04 DIAGNOSIS — I10 Essential (primary) hypertension: Secondary | ICD-10-CM

## 2015-04-04 DIAGNOSIS — E1143 Type 2 diabetes mellitus with diabetic autonomic (poly)neuropathy: Secondary | ICD-10-CM | POA: Diagnosis not present

## 2015-04-04 DIAGNOSIS — E1142 Type 2 diabetes mellitus with diabetic polyneuropathy: Secondary | ICD-10-CM

## 2015-04-04 DIAGNOSIS — K3184 Gastroparesis: Secondary | ICD-10-CM

## 2015-04-04 MED ORDER — ERYTHROMYCIN BASE 250 MG PO TABS: 250.0000 mg | ORAL_TABLET | Freq: Three times a day (TID) | ORAL | Status: DC

## 2015-04-04 MED ORDER — BENZONATATE 100 MG PO CAPS: 100.0000 mg | ORAL_CAPSULE | Freq: Three times a day (TID) | ORAL | Status: DC | PRN

## 2015-04-04 MED ORDER — OXYCODONE-ACETAMINOPHEN 5-325 MG PO TABS: 1.0000 | ORAL_TABLET | Freq: Three times a day (TID) | ORAL | Status: DC | PRN

## 2015-04-04 MED ORDER — LEVOFLOXACIN 750 MG PO TABS: 750.0000 mg | ORAL_TABLET | Freq: Every day | ORAL | Status: DC
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
## 2015-04-04 NOTE — Patient Instructions (Signed)
Dear [name] [name], Thank you for coming in to clinic today.  1. You may have a Pneumonia, we will try with antibiotics - Take Levaquin 750mg  daily for 5 days - do NOT take Erythromycin until completed Levaquin. Sent refills - Cough medicine - Tessalon Perls take up to 3 times a day as needed  2. For Pain - May Take Percocet up to 2 tablets daily for next 1 month - Continue Lyrica - [facility]erral to [facility] in [place] - you will be contacted with an appointment - if running low of Percocet and no appointment, call us and leave message to get refill, will do this for 3 months and then you would need to be seen in office  Try to follow-up with Dr Valentina Lucks for Diabetes and Blood Pressure as soon as possible, if unable to make it tomorrow  If symptoms worsening with chest pain, cough, vomiting, fevers, not tolerating pills, then you need to go to [facility] Emergency Dept.  Please schedule a follow-up appointment with Dr. Parks Ranger in 3 months for Chronic Pain / Diabetes  If you have any other questions or concerns, please feel free to call the clinic to contact me. You may also schedule an earlier appointment if necessary.  However, if your symptoms get significantly worse, please go to the Emergency Department to seek immediate medical attention.  [name], [place]

## 2015-04-05 ENCOUNTER — Ambulatory Visit: Payer: Medicare Other | Admitting: Pharmacist

## 2015-04-05 DIAGNOSIS — J189 Pneumonia, unspecified organism: Secondary | ICD-10-CM | POA: Insufficient documentation

## 2015-04-05 NOTE — Assessment & Plan Note (Signed)
Refill Erythromycin per request, seemed that refill was already sent. - HOLD currently while taking levaquin for CAP, may resume once complete

## 2015-04-05 NOTE — Assessment & Plan Note (Signed)
Clinically consistent with presumed CAP in high risk patient with DM, CHF. Recent history prolonged sinusitis vs bronchitis. Not recent hospitalization or IV antibiotics within past 90 days. No focal signs of sinus infection. Considered unlikely PE (Well's 1.5 for tachy). - Low grade temp 19F but reported subjective fevers/chills/sweats, tachycardic, no hypoxia (98% on RA)  Plan: 1. Start empiric antibiotics with Levaquin 750mg  daily x 5 days for high risk patient - note last QTc 430. Advised to HOLD erythromycin for 5 days while taking Levaquin, avoid QT prolong. 2. Recommend improve hydration and may continue Mucinex 3. Tessalon Perls for cough TID PRN 4. Tylenol, Ibuprofen PRN fevers 5. RTC within 1 week if not improving, otherwise strict return criteria to go to ED

## 2015-04-05 NOTE — Assessment & Plan Note (Signed)
Significantly elevated initially SBP >200, manual re-check SBP down to 170s. Currently in distress with frequent coughing and generalized pain. Suspected CAP. Meds - Coreg 25mg  BID, HCTZ 25mg  daily, Lisinopril 40mg  daily, Amlodipine 5mg  daily, Spiro 25mg  daily, Lasix 20mg  PRN (using) No complications   Plan:  1. Continue current BP meds - difficult to adjust as patient seems to usually have acute on chronic illness and significant pain at most visits. 2. Lifestyle Mods - Smoking cessation, Exercise, Dec salt intake 3. Monitor BP at home or at drug store occasionally 4. May need ambulatory BP monitoring in future

## 2015-04-05 NOTE — Assessment & Plan Note (Signed)
Acute on chronic worsening of chronic pain syndrome, now with more extremity hand/feet neuropathic pain, seems consistent with multiple causes primarily fibromyalgia, neuropathy, but also abd pain with DM gastroparesis and recurrent pancreatitis.  Plan: 1. Continue Lyrica 150 AM / PM and 300mg  QHS, for now but seems may not be as effective can reduce to 150 TID within next visit if not improved still 2. Consider adding cymbalta 3. Provide temporary course with Percocet #60 in acute illness and referral to Pain Management (preferred pain management), advised against chronic narcotic rx in setting of gastroparesis

## 2015-04-05 NOTE — Assessment & Plan Note (Signed)
Not due for repeat A1c today. No CBG readings. - No changes today, see prior note. - Continue Lantus 40, Humalog TID with meals - if can improve appetite otherwise should reduce Humalog dosing - S/p ophtho - Never returned for future fasting lipid panel (today not fasting) - Consider check direct LDL - Strongly recommended to follow-up with Pharmacy Clinic Dr Valentina Lucks tomorrow 04/05/15

## 2015-04-18 ENCOUNTER — Other Ambulatory Visit: Payer: Self-pay | Admitting: Family Medicine

## 2015-04-18 DIAGNOSIS — R1013 Epigastric pain: Secondary | ICD-10-CM

## 2015-04-18 DIAGNOSIS — K219 Gastro-esophageal reflux disease without esophagitis: Secondary | ICD-10-CM

## 2015-04-18 DIAGNOSIS — R109 Unspecified abdominal pain: Secondary | ICD-10-CM

## 2015-04-26 ENCOUNTER — Ambulatory Visit: Payer: Medicare Other | Admitting: Pharmacist

## 2015-05-03 ENCOUNTER — Ambulatory Visit: Payer: Medicare Other | Admitting: Pharmacist

## 2015-05-07 ENCOUNTER — Other Ambulatory Visit: Payer: Self-pay | Admitting: *Deleted

## 2015-05-07 DIAGNOSIS — B372 Candidiasis of skin and nail: Secondary | ICD-10-CM

## 2015-05-08 MED ORDER — NYSTATIN 100000 UNIT/GM EX POWD: Freq: Three times a day (TID) | CUTANEOUS | Status: DC

## 2015-05-10 ENCOUNTER — Emergency Department (INDEPENDENT_AMBULATORY_CARE_PROVIDER_SITE_OTHER)
Admission: EM | Admit: 2015-05-10 | Discharge: 2015-05-10 | Disposition: A | Discharge status: Home / Self Care | Payer: Medicare Other | Source: Home / Self Care | Attending: Family Medicine | Admitting: Family Medicine

## 2015-05-10 ENCOUNTER — Ambulatory Visit: Payer: Medicare Other | Admitting: Pharmacist

## 2015-05-10 ENCOUNTER — Emergency Department (HOSPITAL_COMMUNITY): Payer: Medicare Other

## 2015-05-10 ENCOUNTER — Inpatient Hospital Stay (HOSPITAL_COMMUNITY)
Admission: EM | Admit: 2015-05-10 | Discharge: 2015-05-14 | DRG: 439 | Disposition: A | Discharge status: Home / Self Care | Payer: Medicare Other | Attending: Family Medicine | Admitting: Family Medicine

## 2015-05-10 ENCOUNTER — Encounter (HOSPITAL_COMMUNITY): Payer: Self-pay | Admitting: *Deleted

## 2015-05-10 ENCOUNTER — Encounter (HOSPITAL_COMMUNITY): Payer: Self-pay | Admitting: Emergency Medicine

## 2015-05-10 DIAGNOSIS — K219 Gastro-esophageal reflux disease without esophagitis: Secondary | ICD-10-CM | POA: Diagnosis present

## 2015-05-10 DIAGNOSIS — R079 Chest pain, unspecified: Secondary | ICD-10-CM | POA: Diagnosis present

## 2015-05-10 DIAGNOSIS — R0600 Dyspnea, unspecified: Secondary | ICD-10-CM | POA: Diagnosis not present

## 2015-05-10 DIAGNOSIS — K3184 Gastroparesis: Secondary | ICD-10-CM | POA: Diagnosis present

## 2015-05-10 DIAGNOSIS — E876 Hypokalemia: Secondary | ICD-10-CM | POA: Diagnosis present

## 2015-05-10 DIAGNOSIS — J449 Chronic obstructive pulmonary disease, unspecified: Secondary | ICD-10-CM | POA: Insufficient documentation

## 2015-05-10 DIAGNOSIS — R0602 Shortness of breath: Secondary | ICD-10-CM

## 2015-05-10 DIAGNOSIS — R51 Headache: Secondary | ICD-10-CM | POA: Diagnosis present

## 2015-05-10 DIAGNOSIS — E1143 Type 2 diabetes mellitus with diabetic autonomic (poly)neuropathy: Secondary | ICD-10-CM | POA: Diagnosis present

## 2015-05-10 DIAGNOSIS — Z809 Family history of malignant neoplasm, unspecified: Secondary | ICD-10-CM

## 2015-05-10 DIAGNOSIS — E1165 Type 2 diabetes mellitus with hyperglycemia: Secondary | ICD-10-CM

## 2015-05-10 DIAGNOSIS — R059 Cough, unspecified: Secondary | ICD-10-CM

## 2015-05-10 DIAGNOSIS — Z823 Family history of stroke: Secondary | ICD-10-CM

## 2015-05-10 DIAGNOSIS — K5900 Constipation, unspecified: Secondary | ICD-10-CM | POA: Diagnosis present

## 2015-05-10 DIAGNOSIS — K859 Acute pancreatitis without necrosis or infection, unspecified: Principal | ICD-10-CM | POA: Diagnosis present

## 2015-05-10 DIAGNOSIS — E1169 Type 2 diabetes mellitus with other specified complication: Secondary | ICD-10-CM

## 2015-05-10 DIAGNOSIS — F17210 Nicotine dependence, cigarettes, uncomplicated: Secondary | ICD-10-CM | POA: Diagnosis present

## 2015-05-10 DIAGNOSIS — R05 Cough: Secondary | ICD-10-CM

## 2015-05-10 DIAGNOSIS — Z8249 Family history of ischemic heart disease and other diseases of the circulatory system: Secondary | ICD-10-CM

## 2015-05-10 DIAGNOSIS — IMO0002 Reserved for concepts with insufficient information to code with codable children: Secondary | ICD-10-CM

## 2015-05-10 DIAGNOSIS — B379 Candidiasis, unspecified: Secondary | ICD-10-CM | POA: Diagnosis present

## 2015-05-10 DIAGNOSIS — F329 Major depressive disorder, single episode, unspecified: Secondary | ICD-10-CM | POA: Diagnosis present

## 2015-05-10 DIAGNOSIS — IMO0001 Reserved for inherently not codable concepts without codable children: Secondary | ICD-10-CM

## 2015-05-10 DIAGNOSIS — B370 Candidal stomatitis: Secondary | ICD-10-CM | POA: Diagnosis present

## 2015-05-10 DIAGNOSIS — F411 Generalized anxiety disorder: Secondary | ICD-10-CM | POA: Diagnosis present

## 2015-05-10 DIAGNOSIS — R7989 Other specified abnormal findings of blood chemistry: Secondary | ICD-10-CM

## 2015-05-10 DIAGNOSIS — E785 Hyperlipidemia, unspecified: Secondary | ICD-10-CM | POA: Diagnosis present

## 2015-05-10 DIAGNOSIS — Z7982 Long term (current) use of aspirin: Secondary | ICD-10-CM

## 2015-05-10 DIAGNOSIS — I2510 Atherosclerotic heart disease of native coronary artery without angina pectoris: Secondary | ICD-10-CM | POA: Diagnosis present

## 2015-05-10 DIAGNOSIS — E1142 Type 2 diabetes mellitus with diabetic polyneuropathy: Secondary | ICD-10-CM | POA: Diagnosis present

## 2015-05-10 DIAGNOSIS — J45901 Unspecified asthma with (acute) exacerbation: Secondary | ICD-10-CM | POA: Diagnosis present

## 2015-05-10 DIAGNOSIS — J40 Bronchitis, not specified as acute or chronic: Secondary | ICD-10-CM | POA: Insufficient documentation

## 2015-05-10 DIAGNOSIS — J441 Chronic obstructive pulmonary disease with (acute) exacerbation: Secondary | ICD-10-CM | POA: Diagnosis present

## 2015-05-10 DIAGNOSIS — I110 Hypertensive heart disease with heart failure: Secondary | ICD-10-CM | POA: Diagnosis present

## 2015-05-10 DIAGNOSIS — Z794 Long term (current) use of insulin: Secondary | ICD-10-CM

## 2015-05-10 DIAGNOSIS — I25119 Atherosclerotic heart disease of native coronary artery with unspecified angina pectoris: Secondary | ICD-10-CM | POA: Insufficient documentation

## 2015-05-10 DIAGNOSIS — I160 Hypertensive urgency: Secondary | ICD-10-CM | POA: Diagnosis present

## 2015-05-10 DIAGNOSIS — Z833 Family history of diabetes mellitus: Secondary | ICD-10-CM

## 2015-05-10 DIAGNOSIS — Z8673 Personal history of transient ischemic attack (TIA), and cerebral infarction without residual deficits: Secondary | ICD-10-CM

## 2015-05-10 DIAGNOSIS — R778 Other specified abnormalities of plasma proteins: Secondary | ICD-10-CM

## 2015-05-10 DIAGNOSIS — I5032 Chronic diastolic (congestive) heart failure: Secondary | ICD-10-CM | POA: Diagnosis present

## 2015-05-10 LAB — BASIC METABOLIC PANEL WITH GFR
Anion gap: 15 (ref 5–15)
BUN: <5 mg/dL — ABNORMAL LOW (ref 6–20)
CO2: 28 mmol/L (ref 22–32)
Calcium: 8.9 mg/dL (ref 8.9–10.3)
Chloride: 99 mmol/L — ABNORMAL LOW (ref 101–111)
Creatinine, Ser: 0.78 mg/dL (ref 0.44–1.00)
GFR calc Af Amer: >60 mL/min
GFR calc non Af Amer: >60 mL/min
Glucose, Bld: 132 mg/dL — ABNORMAL HIGH (ref 65–99)
Potassium: 3 mmol/L — ABNORMAL LOW (ref 3.5–5.1)
Sodium: 142 mmol/L (ref 135–145)

## 2015-05-10 LAB — CBC
HCT: 46.5 % — ABNORMAL HIGH (ref 36.0–46.0)
Hemoglobin: 15.5 g/dL — ABNORMAL HIGH (ref 12.0–15.0)
MCH: 28.4 pg (ref 26.0–34.0)
MCHC: 33.3 g/dL (ref 30.0–36.0)
MCV: 85.2 fL (ref 78.0–100.0)
Platelets: 161 10*3/uL (ref 150–400)
RBC: 5.46 MIL/uL — ABNORMAL HIGH (ref 3.87–5.11)
RDW: 14 % (ref 11.5–15.5)
WBC: 6.8 10*3/uL (ref 4.0–10.5)

## 2015-05-10 LAB — I-STAT TROPONIN, ED: Troponin i, poc: 0.12 ng/mL (ref 0.00–0.08)

## 2015-05-10 LAB — I-STAT CG4 LACTIC ACID, ED: Lactic Acid, Venous: 1.72 mmol/L (ref 0.5–2.0)

## 2015-05-10 MED ORDER — ALBUTEROL SULFATE (2.5 MG/3ML) 0.083% IN NEBU
INHALATION_SOLUTION | RESPIRATORY_TRACT | Status: AC
  Filled 2015-05-10: qty 6

## 2015-05-10 MED ORDER — IPRATROPIUM BROMIDE 0.02 % IN SOLN
RESPIRATORY_TRACT | Status: AC
  Filled 2015-05-10: qty 2.5

## 2015-05-10 MED ORDER — IPRATROPIUM-ALBUTEROL 0.5-2.5 (3) MG/3ML IN SOLN
3.0000 mL | RESPIRATORY_TRACT | Status: DC
  Administered 2015-05-10 – 2015-05-11 (×3): 3 mL via RESPIRATORY_TRACT
  Filled 2015-05-10 (×3): qty 3

## 2015-05-10 MED ORDER — KETOROLAC TROMETHAMINE 30 MG/ML IJ SOLN
30.0000 mg | Freq: Once | INTRAMUSCULAR | Status: AC
  Administered 2015-05-10: 30 mg via INTRAVENOUS
  Filled 2015-05-10: qty 1

## 2015-05-10 MED ORDER — HYDROCODONE-HOMATROPINE 5-1.5 MG/5ML PO SYRP
5.0000 mL | ORAL_SOLUTION | Freq: Once | ORAL | Status: AC
  Administered 2015-05-10: 5 mL via ORAL
  Filled 2015-05-10: qty 5

## 2015-05-10 MED ORDER — IPRATROPIUM BROMIDE 0.02 % IN SOLN
0.5000 mg | Freq: Once | RESPIRATORY_TRACT | Status: AC
  Administered 2015-05-10: 0.5 mg via RESPIRATORY_TRACT

## 2015-05-10 MED ORDER — ALBUTEROL SULFATE (2.5 MG/3ML) 0.083% IN NEBU
5.0000 mg | INHALATION_SOLUTION | Freq: Once | RESPIRATORY_TRACT | Status: AC
  Administered 2015-05-10: 5 mg via RESPIRATORY_TRACT

## 2015-05-10 NOTE — ED Notes (Signed)
Informed EDP that pt has low grade fever, recent documented infection. Going to initiate sepsis triage protocol

## 2015-05-10 NOTE — ED Notes (Signed)
Pt in [place] from [facility]. Dx with pneumonia 1 mo ago, inc SOB, COPD exacerbation. O2 sats 100% RA
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
## 2015-05-10 NOTE — ED Notes (Signed)
Pt reports  Shortness     Of  Breath        And  Cough   With  Weakness     X  3  Weeks     Pt  Was  Seen  For  Possible  pnuemonia   She  States   And  Was  Treated  With  Anti  Biotics        She  Has  A  History      Of  Asthma      And  Is  A  Smoker

## 2015-05-11 ENCOUNTER — Encounter (HOSPITAL_COMMUNITY): Payer: Self-pay | Admitting: *Deleted

## 2015-05-11 DIAGNOSIS — R7989 Other specified abnormal findings of blood chemistry: Secondary | ICD-10-CM

## 2015-05-11 DIAGNOSIS — R079 Chest pain, unspecified: Secondary | ICD-10-CM | POA: Insufficient documentation

## 2015-05-11 DIAGNOSIS — R778 Other specified abnormalities of plasma proteins: Secondary | ICD-10-CM | POA: Insufficient documentation

## 2015-05-11 DIAGNOSIS — K859 Acute pancreatitis without necrosis or infection, unspecified: Secondary | ICD-10-CM | POA: Insufficient documentation

## 2015-05-11 DIAGNOSIS — J40 Bronchitis, not specified as acute or chronic: Secondary | ICD-10-CM | POA: Diagnosis not present

## 2015-05-11 DIAGNOSIS — I25119 Atherosclerotic heart disease of native coronary artery with unspecified angina pectoris: Secondary | ICD-10-CM | POA: Insufficient documentation

## 2015-05-11 LAB — GLUCOSE, CAPILLARY
Glucose-Capillary: 172 mg/dL — ABNORMAL HIGH (ref 65–99)
Glucose-Capillary: 182 mg/dL — ABNORMAL HIGH (ref 65–99)
Glucose-Capillary: 202 mg/dL — ABNORMAL HIGH (ref 65–99)
Glucose-Capillary: 252 mg/dL — ABNORMAL HIGH (ref 65–99)
Glucose-Capillary: 328 mg/dL — ABNORMAL HIGH (ref 65–99)

## 2015-05-11 LAB — LIPID PANEL
Cholesterol: 172 mg/dL (ref 0–200)
HDL: 27 mg/dL — ABNORMAL LOW (ref >40)
LDL Cholesterol: 101 mg/dL — ABNORMAL HIGH (ref 0–99)
Total CHOL/HDL Ratio: 6.4 RATIO
Triglycerides: 218 mg/dL — ABNORMAL HIGH (ref <150)
VLDL: 44 mg/dL — ABNORMAL HIGH (ref 0–40)

## 2015-05-11 LAB — CBC
HCT: 43.2 % (ref 36.0–46.0)
Hemoglobin: 14.2 g/dL (ref 12.0–15.0)
MCH: 28 pg (ref 26.0–34.0)
MCHC: 32.9 g/dL (ref 30.0–36.0)
MCV: 85 fL (ref 78.0–100.0)
Platelets: 169 10*3/uL (ref 150–400)
RBC: 5.08 MIL/uL (ref 3.87–5.11)
RDW: 14.3 % (ref 11.5–15.5)
WBC: 6.7 10*3/uL (ref 4.0–10.5)

## 2015-05-11 LAB — BASIC METABOLIC PANEL
Anion gap: 14 (ref 5–15)
BUN: 6 mg/dL (ref 6–20)
CO2: 28 mmol/L (ref 22–32)
Calcium: 8.5 mg/dL — ABNORMAL LOW (ref 8.9–10.3)
Chloride: 99 mmol/L — ABNORMAL LOW (ref 101–111)
Creatinine, Ser: 1.22 mg/dL — ABNORMAL HIGH (ref 0.44–1.00)
GFR calc Af Amer: 52 mL/min — ABNORMAL LOW (ref >60)
GFR calc non Af Amer: 45 mL/min — ABNORMAL LOW (ref >60)
Glucose, Bld: 227 mg/dL — ABNORMAL HIGH (ref 65–99)
Potassium: 2.8 mmol/L — ABNORMAL LOW (ref 3.5–5.1)
Sodium: 141 mmol/L (ref 135–145)

## 2015-05-11 LAB — TROPONIN I
Troponin I: 0.12 ng/mL — ABNORMAL HIGH (ref <0.031)
Troponin I: 0.12 ng/mL — ABNORMAL HIGH (ref <0.031)
Troponin I: 0.12 ng/mL — ABNORMAL HIGH (ref <0.031)

## 2015-05-11 LAB — LIPASE, BLOOD: Lipase: 259 U/L — ABNORMAL HIGH (ref 11–51)

## 2015-05-11 LAB — I-STAT CG4 LACTIC ACID, ED: Lactic Acid, Venous: 1.75 mmol/L (ref 0.5–2.0)

## 2015-05-11 MED ORDER — AMLODIPINE BESYLATE 5 MG PO TABS
5.0000 mg | ORAL_TABLET | Freq: Every day | ORAL | Status: DC
  Administered 2015-05-11 – 2015-05-14 (×4): 5 mg via ORAL
  Filled 2015-05-11 (×4): qty 1

## 2015-05-11 MED ORDER — FUROSEMIDE 20 MG PO TABS
20.0000 mg | ORAL_TABLET | ORAL | Status: DC
  Administered 2015-05-11 – 2015-05-13 (×2): 20 mg via ORAL
  Filled 2015-05-11 (×2): qty 1

## 2015-05-11 MED ORDER — POTASSIUM CHLORIDE CRYS ER 20 MEQ PO TBCR
40.0000 meq | EXTENDED_RELEASE_TABLET | Freq: Three times a day (TID) | ORAL | Status: DC
  Administered 2015-05-11 (×3): 40 meq via ORAL
  Filled 2015-05-11 (×3): qty 2

## 2015-05-11 MED ORDER — ASPIRIN 325 MG PO TABS
325.0000 mg | ORAL_TABLET | Freq: Once | ORAL | Status: AC
  Administered 2015-05-11: 325 mg via ORAL
  Filled 2015-05-11: qty 1

## 2015-05-11 MED ORDER — INSULIN ASPART 100 UNIT/ML ~~LOC~~ SOLN
0.0000 [IU] | Freq: Three times a day (TID) | SUBCUTANEOUS | Status: DC
  Administered 2015-05-11: 5 [IU] via SUBCUTANEOUS
  Administered 2015-05-11: 8 [IU] via SUBCUTANEOUS
  Administered 2015-05-11 – 2015-05-12 (×2): 3 [IU] via SUBCUTANEOUS
  Administered 2015-05-12 – 2015-05-13 (×3): 5 [IU] via SUBCUTANEOUS
  Administered 2015-05-14: 11 [IU] via SUBCUTANEOUS
  Administered 2015-05-14: 5 [IU] via SUBCUTANEOUS
  Administered 2015-05-14: 3 [IU] via SUBCUTANEOUS

## 2015-05-11 MED ORDER — ENOXAPARIN SODIUM 40 MG/0.4ML ~~LOC~~ SOLN
40.0000 mg | Freq: Every day | SUBCUTANEOUS | Status: DC
  Administered 2015-05-11 – 2015-05-14 (×4): 40 mg via SUBCUTANEOUS
  Filled 2015-05-11 (×4): qty 0.4

## 2015-05-11 MED ORDER — METOCLOPRAMIDE HCL 10 MG PO TABS
10.0000 mg | ORAL_TABLET | Freq: Three times a day (TID) | ORAL | Status: DC
  Administered 2015-05-11 – 2015-05-14 (×12): 10 mg via ORAL
  Filled 2015-05-11 (×11): qty 1

## 2015-05-11 MED ORDER — BUDESONIDE 0.25 MG/2ML IN SUSP
0.2500 mg | Freq: Two times a day (BID) | RESPIRATORY_TRACT | Status: DC
  Administered 2015-05-11 – 2015-05-14 (×7): 0.25 mg via RESPIRATORY_TRACT
  Filled 2015-05-11 (×7): qty 2

## 2015-05-11 MED ORDER — ERYTHROMYCIN BASE 250 MG PO TABS
250.0000 mg | ORAL_TABLET | Freq: Three times a day (TID) | ORAL | Status: DC
  Administered 2015-05-11 – 2015-05-12 (×6): 250 mg via ORAL
  Filled 2015-05-11 (×7): qty 1

## 2015-05-11 MED ORDER — POLYETHYLENE GLYCOL 3350 17 G PO PACK: 17.0000 g | PACK | Freq: Every day | ORAL | Status: DC | PRN

## 2015-05-11 MED ORDER — GLUCERNA SHAKE PO LIQD
237.0000 mL | ORAL | Status: DC
  Administered 2015-05-11 – 2015-05-13 (×2): 237 mL via ORAL

## 2015-05-11 MED ORDER — NYSTATIN 100000 UNIT/ML MT SUSP
5.0000 mL | Freq: Three times a day (TID) | OROMUCOSAL | Status: DC
  Administered 2015-05-11 – 2015-05-14 (×15): 500000 [IU] via ORAL
  Filled 2015-05-11 (×14): qty 5

## 2015-05-11 MED ORDER — NICOTINE 7 MG/24HR TD PT24
7.0000 mg | MEDICATED_PATCH | Freq: Every day | TRANSDERMAL | Status: DC
  Administered 2015-05-11 – 2015-05-14 (×4): 7 mg via TRANSDERMAL
  Filled 2015-05-11 (×4): qty 1

## 2015-05-11 MED ORDER — POTASSIUM CHLORIDE CRYS ER 20 MEQ PO TBCR
40.0000 meq | EXTENDED_RELEASE_TABLET | Freq: Two times a day (BID) | ORAL | Status: DC
  Administered 2015-05-11: 40 meq via ORAL
  Filled 2015-05-11: qty 2

## 2015-05-11 MED ORDER — CARVEDILOL 25 MG PO TABS
25.0000 mg | ORAL_TABLET | Freq: Two times a day (BID) | ORAL | Status: DC
  Administered 2015-05-11 – 2015-05-12 (×4): 25 mg via ORAL
  Filled 2015-05-11 (×4): qty 1

## 2015-05-11 MED ORDER — PREGABALIN 75 MG PO CAPS
150.0000 mg | ORAL_CAPSULE | Freq: Two times a day (BID) | ORAL | Status: DC
  Administered 2015-05-12 – 2015-05-14 (×5): 150 mg via ORAL
  Filled 2015-05-11 (×4): qty 2

## 2015-05-11 MED ORDER — HYDRALAZINE HCL 20 MG/ML IJ SOLN: 5.0000 mg | Freq: Four times a day (QID) | INTRAMUSCULAR | Status: DC | PRN

## 2015-05-11 MED ORDER — NITROGLYCERIN 2 % TD OINT
1.0000 [in_us] | TOPICAL_OINTMENT | Freq: Once | TRANSDERMAL | Status: AC
  Administered 2015-05-11: 1 [in_us] via TOPICAL
  Filled 2015-05-11: qty 1

## 2015-05-11 MED ORDER — LISINOPRIL 40 MG PO TABS
40.0000 mg | ORAL_TABLET | Freq: Every day | ORAL | Status: DC
  Administered 2015-05-11 – 2015-05-14 (×4): 40 mg via ORAL
  Filled 2015-05-11 (×3): qty 1

## 2015-05-11 MED ORDER — INFLUENZA VAC SPLIT QUAD 0.5 ML IM SUSY: 0.5000 mL | PREFILLED_SYRINGE | INTRAMUSCULAR | Status: DC

## 2015-05-11 MED ORDER — BENZONATATE 100 MG PO CAPS
100.0000 mg | ORAL_CAPSULE | Freq: Three times a day (TID) | ORAL | Status: DC | PRN
  Administered 2015-05-12: 100 mg via ORAL
  Filled 2015-05-11: qty 1

## 2015-05-11 MED ORDER — HYDROXYZINE HCL 25 MG PO TABS: 25.0000 mg | ORAL_TABLET | Freq: Three times a day (TID) | ORAL | Status: DC | PRN

## 2015-05-11 MED ORDER — HYDROCHLOROTHIAZIDE 25 MG PO TABS
25.0000 mg | ORAL_TABLET | Freq: Every morning | ORAL | Status: DC
  Administered 2015-05-11 – 2015-05-14 (×4): 25 mg via ORAL
  Filled 2015-05-11 (×3): qty 1

## 2015-05-11 MED ORDER — ATORVASTATIN CALCIUM 20 MG PO TABS: 20.0000 mg | ORAL_TABLET | Freq: Every morning | ORAL | Status: DC

## 2015-05-11 MED ORDER — PREDNISONE 50 MG PO TABS
50.0000 mg | ORAL_TABLET | Freq: Every day | ORAL | Status: DC
  Administered 2015-05-11 – 2015-05-12 (×2): 50 mg via ORAL
  Filled 2015-05-11: qty 1

## 2015-05-11 MED ORDER — INSULIN GLARGINE 100 UNIT/ML ~~LOC~~ SOLN
20.0000 [IU] | Freq: Two times a day (BID) | SUBCUTANEOUS | Status: DC
  Administered 2015-05-11 (×2): 20 [IU] via SUBCUTANEOUS
  Filled 2015-05-11 (×4): qty 0.2

## 2015-05-11 MED ORDER — DOCUSATE SODIUM 100 MG PO CAPS
100.0000 mg | ORAL_CAPSULE | Freq: Two times a day (BID) | ORAL | Status: DC
  Administered 2015-05-11 – 2015-05-14 (×6): 100 mg via ORAL
  Filled 2015-05-11 (×7): qty 1

## 2015-05-11 MED ORDER — SPIRONOLACTONE 25 MG PO TABS
25.0000 mg | ORAL_TABLET | Freq: Every day | ORAL | Status: DC
  Administered 2015-05-11 – 2015-05-14 (×4): 25 mg via ORAL
  Filled 2015-05-11 (×3): qty 1

## 2015-05-11 MED ORDER — INSULIN ASPART 100 UNIT/ML ~~LOC~~ SOLN
0.0000 [IU] | Freq: Every day | SUBCUTANEOUS | Status: DC
  Administered 2015-05-11 – 2015-05-13 (×2): 4 [IU] via SUBCUTANEOUS

## 2015-05-11 MED ORDER — MORPHINE SULFATE (PF) 2 MG/ML IV SOLN
1.0000 mg | INTRAVENOUS | Status: DC | PRN
  Administered 2015-05-11 – 2015-05-14 (×12): 1 mg via INTRAVENOUS
  Filled 2015-05-11 (×13): qty 1

## 2015-05-11 MED ORDER — ASPIRIN EC 81 MG PO TBEC
81.0000 mg | DELAYED_RELEASE_TABLET | Freq: Every morning | ORAL | Status: DC
  Administered 2015-05-11 – 2015-05-14 (×4): 81 mg via ORAL
  Filled 2015-05-11 (×4): qty 1

## 2015-05-11 MED ORDER — GI COCKTAIL ~~LOC~~
30.0000 mL | Freq: Four times a day (QID) | ORAL | Status: DC | PRN
  Filled 2015-05-11: qty 30

## 2015-05-11 MED ORDER — PANTOPRAZOLE SODIUM 40 MG PO TBEC
40.0000 mg | DELAYED_RELEASE_TABLET | Freq: Every day | ORAL | Status: DC
  Administered 2015-05-11 – 2015-05-14 (×4): 40 mg via ORAL
  Filled 2015-05-11 (×3): qty 1

## 2015-05-11 MED ORDER — MORPHINE SULFATE (PF) 4 MG/ML IV SOLN
4.0000 mg | Freq: Once | INTRAVENOUS | Status: AC
  Administered 2015-05-11: 4 mg via INTRAVENOUS
  Filled 2015-05-11: qty 1

## 2015-05-11 MED ORDER — LABETALOL HCL 5 MG/ML IV SOLN
20.0000 mg | Freq: Once | INTRAVENOUS | Status: AC
  Administered 2015-05-11: 20 mg via INTRAVENOUS
  Filled 2015-05-11: qty 4

## 2015-05-11 MED ORDER — ATORVASTATIN CALCIUM 40 MG PO TABS
40.0000 mg | ORAL_TABLET | Freq: Every morning | ORAL | Status: DC
  Administered 2015-05-11 – 2015-05-14 (×4): 40 mg via ORAL
  Filled 2015-05-11 (×4): qty 1

## 2015-05-11 MED ORDER — ONDANSETRON HCL 4 MG/2ML IJ SOLN
4.0000 mg | Freq: Four times a day (QID) | INTRAMUSCULAR | Status: DC | PRN
  Administered 2015-05-11: 4 mg via INTRAVENOUS
  Filled 2015-05-11: qty 2

## 2015-05-11 MED ORDER — ACETAMINOPHEN 325 MG PO TABS
650.0000 mg | ORAL_TABLET | ORAL | Status: DC | PRN
  Administered 2015-05-14: 650 mg via ORAL
  Filled 2015-05-11: qty 2

## 2015-05-11 MED ORDER — QUETIAPINE FUMARATE 25 MG PO TABS
100.0000 mg | ORAL_TABLET | Freq: Every day | ORAL | Status: DC
  Administered 2015-05-11 – 2015-05-13 (×3): 100 mg via ORAL
  Filled 2015-05-11 (×3): qty 4

## 2015-05-11 MED ORDER — IPRATROPIUM-ALBUTEROL 0.5-2.5 (3) MG/3ML IN SOLN: 3.0000 mL | RESPIRATORY_TRACT | Status: DC | PRN

## 2015-05-11 MED ORDER — DIPHENHYDRAMINE HCL 25 MG PO CAPS: 50.0000 mg | ORAL_CAPSULE | Freq: Four times a day (QID) | ORAL | Status: DC | PRN
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
## 2015-05-11 NOTE — Progress Notes (Signed)
Initial Nutrition Assessment  DOCUMENTATION CODES:   Obesity unspecified  INTERVENTION:  Provide Glucerna Shake po once daily, each supplement provides 220 kcal and 10 grams of protein Provide healthful snack once daily   NUTRITION DIAGNOSIS:   Inadequate oral intake related to poor appetite as evidenced by per patient/family report, meal completion < 50%.   GOAL:   Patient will meet greater than or equal to 90% of their needs   MONITOR:   PO intake, Supplement acceptance, Skin, Weight trends, Labs, I & O's  REASON FOR ASSESSMENT:   Malnutrition Screening Tool    ASSESSMENT:   65 y.o. female with a history of nonobstructive CAD, diabetes with gastroparesis, hypertension, hyperlipidemia, CVA, GERD, asthma, pancreatitis, tobacco smoker and chronic pain syndrome who came to [facility] ED yesterday for evaluation of chest pain and shortness of breath.  Pt states that she has had a poor appetite for the past few weeks and has been eating light- mainly toast and cheese. She reports losing from 235 lbs to 215 lbs during this time. Per weight history, pt usually weighs 220 lbs -2% weight loss is not significant. Pt states she only ate scrambled eggs for breakfast; confirmed by nursing notes- meal completion 50%. RD encouraged healthful/adequate PO intake; pt agreeable to receiving snack and protein shake daily until appetite improves.   Labs: glucose ranging 132-227 mg/dL, low potassium, high triglycerides  Diet Order:  Diet Heart Room service appropriate?: Yes; Fluid consistency:: Thin  Skin:  Reviewed, no issues  Last BM:  PTA  Height:   Ht Readings from Last 1 Encounters:  05/11/15 5\' 5"  (1.651 m)    Weight:   Wt Readings from Last 1 Encounters:  05/11/15 215 lb 3.2 oz (97.614 kg)    Ideal Body Weight:  56.8 kg  BMI:  Body mass index is 35.81 kg/(m^2).  Estimated Nutritional Needs:   Kcal:  1700-1900  Protein:  100-115 grams  Fluid:  1.9  L/day  EDUCATION NEEDS:   No education needs identified at this time  [place], LDN Inpatient Clinical Dietitian Pager: [phone] After Hours Pager: [phone]
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
## 2015-05-11 NOTE — Discharge Summary (Signed)
[facility] Discharge Summary  Patient name: [name] [name] Medical record number: [id number] Date of birth: 1950/10/28 Age: 65 y.o. Gender: female Date of Admission: 05/10/2015  Date of Discharge: 05/14/15 Admitting Physician: Zenia Resides, MD  Primary Care Provider: [name], DO Consultants: Cardiology  Indication for Hospitalization: Chest pain, ACS rule out  Discharge Diagnoses/Problem List:  Acute Pancreatitis  CAD COPD exacerbation T2DM Depression Chronic pain HTN GERD  Disposition: Home  Discharge Condition: Stable, improved  Discharge Exam:  General: Sitting up on side of bed, eating ice cream, in NAD. ENTM: Thrush present on tongue, MMM Neck: Supple Cardiovascular: RRR, no murmurs Respiratory: Normal work of breathing, CTAB Abdomen: +BS, soft, non-distended, tenderness to palpation of the epigastric area. MSK: Warm and well-perfused, moves all extremities spontaneously, no edema Skin: No rashes or lesions Neuro: Awake, no focal deficits Psych: Appropriate affect, normal behavior  Brief Hospital Course:  [name] [name] is a 65 year old female who presented to the ED with "crushing" and "stabbing" substernal chest pain that was worse with activity and better with rest. In the ED, i-stat troponin was mildly elevated at 0.12. EKG did not show any signs of ischemia/infarct. She was admitted for ACS rule out.  We trended her troponins, which were 0.12 > 0.12 > 0.12. We repeated her EKG, which did not show any signs of ischemia. We ordered a lipase, which was 259, so we thought her chest pain was likely secondary to acute pancreatitis. Cardiology came to see her and agreed that her pain was secondary to pancreatitis. They did not recommend any additional cardiac work-up while she was inpatient, but thought she sho We made her NPO and advanced her diet slowly. We treated her pain with Tylenol and Morphine prn. On the day of  discharge, she was able to tolerate a soft diet and her pain had improved.  On admission, she was noted to have diffuse wheezing with poor air movement. She initially required 2L O2 by [place] but was able to be quickly weaned to room air. She was treated with duonebs q4hrs and q2hrs prn. We also gave her Pulmicort nebs bid. Because she had diffuse wheezing, we started a 5 day burst of Prednisone 31m daily. She should finish this on 1/24. Her breathing improved and she was transitioned to duonebs prn. She was discharged home with refills of her home inhalers.  We decreased her Lantus from 40 units bid (home dose) to 20 units bid because she was not eating as much, given her abdominal pain and she had a morning CBG of 96. She was discharged home on Lantus 20 units bid and Humalog 15 units bid (which was the dose she was taking prior to admission).   Issues for Follow Up:  1. Consider CT chest as an outpatient. Pt has a 30 pack year smoking history. 2. Cardiology recommended outpatient stress test. Please refer her for this. 3. We calculated her ASCVD risk score, which was 58.1%. We increased her Lipitor from 242mdaily to 4040maily. Please make sure she has picked up this prescription. 4. Ms. BowHaithates that she has asthma, however given her long history of smoking, she may have COPD. Recommend PFTs as an outpatient. 5. Pt was found to have thrush on admission. She was treated with Nystatin swish and swallow and her thrush improved. She should use the Nystatin four times a day for 4 more days.  Significant Procedures: None  Significant Labs and Imaging:   Recent  Labs Lab 05/10/15 2230 05/11/15 0442  WBC 6.8 6.7  HGB 15.5* 14.2  HCT 46.5* 43.2  PLT 161 169    Recent Labs Lab 05/10/15 2230 05/11/15 0442 05/12/15 1025 05/13/15 0523  NA 142 141 139 143  K 3.0* 2.8* 4.1 4.0  CL 99* 99* 99* 106  CO2 _0 GLUCOSE 132* 227* 244* 120*  BUN <5* _1 CREATININE 0.78 1.22* 0.98  0.75  CALCIUM 8.9 8.5* 9.1 8.7*   Troponins 0.12 > 0.12 > 0.12 Lipase 259 Lipid panel: Chol 172, TG 218, HDL 27, LDL 101 Blood cultures: no growth x 2 days  CXR (1/19): No active cardiopulmonary disease  Results/Tests Pending at Time of Discharge: None  Discharge Medications:    Medication List    STOP taking these medications        Insulin Glargine 100 UNIT/ML Solostar Pen  Commonly known as:  LANTUS SOLOSTAR  Replaced by:  insulin glargine 100 UNIT/ML injection     oxyCODONE-acetaminophen 5-325 MG tablet  Commonly known as:  PERCOCET      TAKE these medications        ACCU-CHEK AVIVA PLUS w/Device Kit  1 Device by Does not apply route once.     ACCU-CHEK FASTCLIX LANCETS Misc  Check sugars up to 5 times daily, fasting and 2 hours after each meal, as well as as needed with symptoms of hypoglycemia     albuterol 108 (90 Base) MCG/ACT inhaler  Commonly known as:  PROVENTIL HFA;VENTOLIN HFA  Inhale 2 puffs into the lungs every 4 (four) hours as needed for wheezing or shortness of breath.     ALREX 0.2 % Susp  Generic drug:  loteprednol  Apply 1-2 drops to eye 2 (two) times daily as needed (itching).     amLODipine 5 MG tablet  Commonly known as:  NORVASC  Take 1 tablet (5 mg total) by mouth daily.     ARTIFICIAL TEAR OP  Place 1 drop into both eyes 2 (two) times daily as needed (dry eyes).     aspirin EC 81 MG tablet  Take 81 mg by mouth every morning.     atorvastatin 40 MG tablet  Commonly known as:  LIPITOR  Take 1 tablet (40 mg total) by mouth every morning.     beclomethasone 80 MCG/ACT inhaler  Commonly known as:  QVAR  Inhale 2 puffs into the lungs 2 (two) times daily.     benzonatate 100 MG capsule  Commonly known as:  TESSALON  Take 1 capsule (100 mg total) by mouth 3 (three) times daily as needed for cough.     carvedilol 25 MG tablet  Commonly known as:  COREG  take 1 tablet by mouth twice a day with food     clobetasol ointment 0.05 %   Commonly known as:  TEMOVATE  Apply 1 application topically 2 (two) times daily. Apply under nails     Difluprednate 0.05 % Emul  Commonly known as:  DUREZOL  Place 2 drops into the right eye 2 (two) times daily.     diphenhydrAMINE 25 mg capsule  Commonly known as:  BENADRYL  Take 50 mg by mouth every 6 (six) hours as needed for itching.     docusate sodium 100 MG capsule  Commonly known as:  COLACE  Take 100 mg by mouth 2 (two) times daily.     erythromycin 250 MG tablet  Commonly known as:  E-MYCIN  Take  1 tablet (250 mg total) by mouth 3 (three) times daily.     EXCEDRIN MIGRAINE PO  Take 2 capsules by mouth 3 (three) times daily as needed (migraine).     furosemide 20 MG tablet  Commonly known as:  LASIX  Take 1 tablet (20 mg total) by mouth every other day.     glucose blood test strip  Commonly known as:  ACCU-CHEK AVIVA  Check sugars up to 5 times daily, fasting and 2 hours after each meal, as well as as needed with symptoms of hypoglycemia     hydrochlorothiazide 25 MG tablet  Commonly known as:  HYDRODIURIL  Take 1 tablet (25 mg total) by mouth every morning.     hydrOXYzine 25 MG tablet  Commonly known as:  ATARAX/VISTARIL  Take 1 tablet (25 mg total) by mouth 3 (three) times daily as needed.     insulin glargine 100 UNIT/ML injection  Commonly known as:  LANTUS  Inject 0.2 mLs (20 Units total) into the skin 2 (two) times daily.     insulin lispro 100 UNIT/ML KiwkPen  Commonly known as:  HUMALOG KWIKPEN  Inject 0.15 mLs (15 Units total) into the skin 2 (two) times daily as needed (only when eating a meal).     Insulin Pen Needle 31G X 8 MM Misc  Commonly known as:  B-D ULTRAFINE III SHORT PEN  Inject 1 each into the skin 5 (five) times daily.     lisinopril 40 MG tablet  Commonly known as:  PRINIVIL,ZESTRIL  Take 1 tablet (40 mg total) by mouth daily.     metoCLOPramide 10 MG tablet  Commonly known as:  REGLAN  Take 1 tablet (10 mg total) by mouth  3 (three) times daily before meals.     nystatin powder  Commonly known as:  MYCOSTATIN  Apply topically 3 (three) times daily. To affected area     omeprazole 20 MG capsule  Commonly known as:  PRILOSEC  take 1 capsule by mouth once daily     ondansetron 4 MG disintegrating tablet  Commonly known as:  ZOFRAN ODT  Take 1 tablet (4 mg total) by mouth every 8 (eight) hours as needed for nausea or vomiting.     ondansetron 4 MG tablet  Commonly known as:  ZOFRAN  Take 1 tablet (4 mg total) by mouth every 8 (eight) hours as needed for nausea or vomiting.     oxyCODONE 5 MG immediate release tablet  Commonly known as:  Oxy IR/ROXICODONE  Take 1 tablet (5 mg total) by mouth every 6 (six) hours as needed for severe pain.     pentosan polysulfate 100 MG capsule  Commonly known as:  ELMIRON  Take 1 capsule (100 mg total) by mouth 3 (three) times daily before meals.     polyethylene glycol packet  Commonly known as:  MIRALAX / GLYCOLAX  Take 17 g by mouth daily as needed for mild constipation. Mix in 4-6 oz of water     predniSONE 50 MG tablet  Commonly known as:  DELTASONE  Take 1 tablet (50 mg total) by mouth daily with breakfast.     pregabalin 150 MG capsule  Commonly known as:  LYRICA  Take 1 cap (131m) in morning and afternoon. Take 2 caps (3036m in evening.     QUEtiapine 200 MG tablet  Commonly known as:  SEROQUEL  Take 0.5 tablets (100 mg total) by mouth at bedtime.     ranitidine 150 MG  tablet  Commonly known as:  ZANTAC  Take 1 tablet (150 mg total) by mouth daily as needed for heartburn. For acid reflux     spironolactone 25 MG tablet  Commonly known as:  ALDACTONE  Take 1 tablet (25 mg total) by mouth daily.     VICKS VAPORUB 4.7-1.2-2.6 % Oint  Apply 1 application topically every evening. Uses with vaporizer and on chest.        Discharge Instructions: Please refer to Patient Instructions section of EMR for full details.  Patient was counseled important  signs and symptoms that should prompt return to medical care, changes in medications, dietary instructions, activity restrictions, and follow up appointments.   Follow-Up Appointments: Follow-up Information    Follow up with [name], DO On 05/16/2015.   Specialty:  Osteopathic Medicine   Why:  hospital follow-up visit at 2:00pm.   Contact information:   [place] [address] [phone]       Sela Hua, MD 05/14/2015, 6:05 PM PGY-1, [place]

## 2015-05-11 NOTE — Progress Notes (Signed)
Interim Progress Note:  S: Pt states her chest pain is better this morning. The breathing treatments have been helping a lot. She has no concerns this morning.  O:  General: Well-appearing, sitting up on the side of the bed, breathing treatment, on 2L [place]. HEENT: [place]/AT, EOMI, MMM Resp: Improved air movement this morning, expiratory wheezing present in all lung fields CV: RRR, no murmurs Msk: No LE edema  A/P:  Chest pain: ACS vs mild pancreatitis vs COPD/asthma exacerbation - Troponins 0.12 > 0.12 - Lipase 259 - AM EKG showing no acute ischemic changes - Cardiology consulted  Mild Acute Pancreatitis: - Can continue heart healthy, cab-modified diet if she is tolerating it; otherwise, will make NPO - Morphine 1mg  q2hrs for pain, Zofran for nausea  COPD/asthma exacerbation: - Continue duonebs q4hrs, q2hrs prn - Pulmicort nebs bid - Blood cultures pending - Will start Prednisone 50mg  daily x 5 days - Wean O2 today - Will need outpatient eval for COPD  Hypokalemia: - K 3.0 on admission > 2.8 today - K-Dur 15mEq tid  HTN: - Continue home meds  T2DM: CBGs have been 172 > 182 since admission. - Lantus 20 units bid, moderate SSI  HLD: Chol 172, TG 218, HDL 27, LDL 101 - Continue Lipitor 40mg  daily  Hyman Bible, MD  PGY-1

## 2015-05-11 NOTE — Care Management Obs Status (Signed)
[facility] NOTIFICATION   Patient Details  Name: [name] [name] MRN: [id number] Date of Birth: [birth year]   Medicare Observation Status Notification Given:  Yes    Vergie Living, RN 05/11/2015, 4:09 PM
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
## 2015-05-12 DIAGNOSIS — R7989 Other specified abnormal findings of blood chemistry: Secondary | ICD-10-CM | POA: Diagnosis not present

## 2015-05-12 DIAGNOSIS — I160 Hypertensive urgency: Secondary | ICD-10-CM | POA: Diagnosis present

## 2015-05-12 DIAGNOSIS — J441 Chronic obstructive pulmonary disease with (acute) exacerbation: Secondary | ICD-10-CM | POA: Diagnosis present

## 2015-05-12 DIAGNOSIS — Z809 Family history of malignant neoplasm, unspecified: Secondary | ICD-10-CM | POA: Diagnosis not present

## 2015-05-12 DIAGNOSIS — K5900 Constipation, unspecified: Secondary | ICD-10-CM | POA: Diagnosis present

## 2015-05-12 DIAGNOSIS — I110 Hypertensive heart disease with heart failure: Secondary | ICD-10-CM | POA: Diagnosis present

## 2015-05-12 DIAGNOSIS — E1142 Type 2 diabetes mellitus with diabetic polyneuropathy: Secondary | ICD-10-CM | POA: Diagnosis present

## 2015-05-12 DIAGNOSIS — F17210 Nicotine dependence, cigarettes, uncomplicated: Secondary | ICD-10-CM | POA: Diagnosis present

## 2015-05-12 DIAGNOSIS — I2510 Atherosclerotic heart disease of native coronary artery without angina pectoris: Secondary | ICD-10-CM | POA: Diagnosis present

## 2015-05-12 DIAGNOSIS — Z823 Family history of stroke: Secondary | ICD-10-CM | POA: Diagnosis not present

## 2015-05-12 DIAGNOSIS — Z8673 Personal history of transient ischemic attack (TIA), and cerebral infarction without residual deficits: Secondary | ICD-10-CM | POA: Diagnosis not present

## 2015-05-12 DIAGNOSIS — K859 Acute pancreatitis without necrosis or infection, unspecified: Secondary | ICD-10-CM | POA: Diagnosis present

## 2015-05-12 DIAGNOSIS — K3184 Gastroparesis: Secondary | ICD-10-CM | POA: Diagnosis present

## 2015-05-12 DIAGNOSIS — Z794 Long term (current) use of insulin: Secondary | ICD-10-CM | POA: Diagnosis not present

## 2015-05-12 DIAGNOSIS — I5032 Chronic diastolic (congestive) heart failure: Secondary | ICD-10-CM | POA: Diagnosis present

## 2015-05-12 DIAGNOSIS — Z7982 Long term (current) use of aspirin: Secondary | ICD-10-CM | POA: Diagnosis not present

## 2015-05-12 DIAGNOSIS — R51 Headache: Secondary | ICD-10-CM | POA: Diagnosis present

## 2015-05-12 DIAGNOSIS — J45901 Unspecified asthma with (acute) exacerbation: Secondary | ICD-10-CM | POA: Diagnosis present

## 2015-05-12 DIAGNOSIS — E876 Hypokalemia: Secondary | ICD-10-CM | POA: Diagnosis present

## 2015-05-12 DIAGNOSIS — B379 Candidiasis, unspecified: Secondary | ICD-10-CM | POA: Diagnosis present

## 2015-05-12 DIAGNOSIS — E785 Hyperlipidemia, unspecified: Secondary | ICD-10-CM | POA: Diagnosis present

## 2015-05-12 DIAGNOSIS — I25119 Atherosclerotic heart disease of native coronary artery with unspecified angina pectoris: Secondary | ICD-10-CM | POA: Diagnosis not present

## 2015-05-12 DIAGNOSIS — K219 Gastro-esophageal reflux disease without esophagitis: Secondary | ICD-10-CM | POA: Diagnosis present

## 2015-05-12 DIAGNOSIS — B370 Candidal stomatitis: Secondary | ICD-10-CM | POA: Diagnosis present

## 2015-05-12 DIAGNOSIS — R079 Chest pain, unspecified: Secondary | ICD-10-CM | POA: Diagnosis present

## 2015-05-12 DIAGNOSIS — F329 Major depressive disorder, single episode, unspecified: Secondary | ICD-10-CM | POA: Diagnosis present

## 2015-05-12 DIAGNOSIS — Z833 Family history of diabetes mellitus: Secondary | ICD-10-CM | POA: Diagnosis not present

## 2015-05-12 DIAGNOSIS — E1143 Type 2 diabetes mellitus with diabetic autonomic (poly)neuropathy: Secondary | ICD-10-CM | POA: Diagnosis present

## 2015-05-12 DIAGNOSIS — J40 Bronchitis, not specified as acute or chronic: Secondary | ICD-10-CM | POA: Diagnosis present

## 2015-05-12 DIAGNOSIS — R0789 Other chest pain: Secondary | ICD-10-CM

## 2015-05-12 DIAGNOSIS — Z8249 Family history of ischemic heart disease and other diseases of the circulatory system: Secondary | ICD-10-CM | POA: Diagnosis not present

## 2015-05-12 DIAGNOSIS — F411 Generalized anxiety disorder: Secondary | ICD-10-CM | POA: Diagnosis present

## 2015-05-12 DIAGNOSIS — K8500 Idiopathic acute pancreatitis without necrosis or infection: Secondary | ICD-10-CM | POA: Diagnosis not present

## 2015-05-12 LAB — BASIC METABOLIC PANEL
Anion gap: 15 (ref 5–15)
BUN: 14 mg/dL (ref 6–20)
CO2: 25 mmol/L (ref 22–32)
Calcium: 9.1 mg/dL (ref 8.9–10.3)
Chloride: 99 mmol/L — ABNORMAL LOW (ref 101–111)
Creatinine, Ser: 0.98 mg/dL (ref 0.44–1.00)
GFR calc Af Amer: >60 mL/min (ref >60)
GFR calc non Af Amer: 59 mL/min — ABNORMAL LOW (ref >60)
Glucose, Bld: 244 mg/dL — ABNORMAL HIGH (ref 65–99)
Potassium: 4.1 mmol/L (ref 3.5–5.1)
Sodium: 139 mmol/L (ref 135–145)

## 2015-05-12 LAB — GLUCOSE, CAPILLARY
Glucose-Capillary: 225 mg/dL — ABNORMAL HIGH (ref 65–99)
Glucose-Capillary: 217 mg/dL — ABNORMAL HIGH (ref 65–99)
Glucose-Capillary: 161 mg/dL — ABNORMAL HIGH (ref 65–99)
Glucose-Capillary: 126 mg/dL — ABNORMAL HIGH (ref 65–99)

## 2015-05-12 MED ORDER — ERYTHROMYCIN BASE 250 MG PO TABS
250.0000 mg | ORAL_TABLET | Freq: Three times a day (TID) | ORAL | Status: DC
  Administered 2015-05-13 – 2015-05-14 (×4): 250 mg via ORAL
  Filled 2015-05-12 (×7): qty 1

## 2015-05-12 MED ORDER — PREDNISONE 50 MG PO TABS
50.0000 mg | ORAL_TABLET | Freq: Every day | ORAL | Status: DC
  Administered 2015-05-13 – 2015-05-14 (×2): 50 mg via ORAL
  Filled 2015-05-12 (×2): qty 1

## 2015-05-12 MED ORDER — INSULIN GLARGINE 100 UNIT/ML ~~LOC~~ SOLN
20.0000 [IU] | Freq: Two times a day (BID) | SUBCUTANEOUS | Status: DC
  Administered 2015-05-12 – 2015-05-14 (×5): 20 [IU] via SUBCUTANEOUS
  Filled 2015-05-12 (×6): qty 0.2

## 2015-05-12 MED ORDER — CARVEDILOL 25 MG PO TABS
25.0000 mg | ORAL_TABLET | Freq: Two times a day (BID) | ORAL | Status: DC
  Administered 2015-05-13 – 2015-05-14 (×4): 25 mg via ORAL
  Filled 2015-05-12 (×4): qty 1

## 2015-05-12 MED ORDER — INSULIN GLARGINE 100 UNIT/ML ~~LOC~~ SOLN
30.0000 [IU] | Freq: Two times a day (BID) | SUBCUTANEOUS | Status: DC
  Filled 2015-05-12 (×2): qty 0.3

## 2015-05-12 MED ORDER — POTASSIUM CHLORIDE 2 MEQ/ML IV SOLN
INTRAVENOUS | Status: DC
  Administered 2015-05-12 – 2015-05-13 (×3): via INTRAVENOUS
  Filled 2015-05-12 (×7): qty 1000

## 2015-05-12 NOTE — Progress Notes (Signed)
Family Medicine Teaching Service Daily Progress Note Intern Pager: [phone]  Patient name: [name] [name] Medical record number: [id number] Date of birth: 08-16-1950 Age: 65 y.o. Gender: female  Primary Care Provider: [name], DO Consultants: Cardiology Code Status: Full  Pt Overview and Major Events to Date:  1/20: Admitted to [facility]MTS for ACS rule out.  Assessment and Plan: Aolani Was is a 65 y.o. female presenting with chest pain, here for ACS rule out. PMH is significant for CAD s/p cath in 2013, HTN, T2DM, HFpEF (EF 65-70%, G2DD), asthma, GERD, depression/anxiety, chronic pain, pancreatitis, tobacco abuse.  Chest pain secondary to mild acute pancreatitis: Lipase 259. Patient is having severe chest and abdominal pain this morning after eating breakfast. - Will make NPO for now and start IVFs. Will advance diet as tolerated. - Cardiology consulted for chest pain. Believe her chest pain is all secondary to the pancreatitis. Recommend outpatient nuclear stress test.  - Tylenol and Morphine 1mg  q2hrs prn for pain, Zofran for nausea - Continue daily ASA 81mg  and Statin - Supplemental oxygen as needed to maintain sats >94% - Cardiac monitoring, continuous pulse ox - Vitals per unit routine.  COPD/asthma exacerbation. Afebrile. On exam, lungs with mild expiratory wheezing. Uses Albuterol and Qvar 69mcg 2 puffs bid at home. - Duonebs q4hrs, q2hrs prn - Pulmicort nebs bid - Blood cultures pending - Tessalon tid prn for cough - Prednisone 50mg  daily x 5 days  Hypokalemia: K 2.8 yesterday morning. - MIVFs with 20 mEq/L - Daily BMP  Thrush: Noted on exam. - Nystatin swish and swallow PO 4 times a day  HTN:BPs improving. 140/52 this morning. - Continue home meds: Norvasc 5mg  daily, Coreg 25mg  bid, HCTZ 25mg  daily, Lisinopril 40mg  daily - Hydralazine 5mg  prn for SBP > 180, DBP > 110.  T2DM: Takes Lantus 40 units bid and Humalog 15 units bid at home. Last HgbA1c was  8.0%. - Will keep Lantus at 20 units today, since we are making her NPO - CBGs q4hrs  HFpEF: Last ECHO 05/2014: EF 65-70%, G2DD. - Continue home meds: Coreg 25mg  bid, Lasix 20mg  every other day, Lisinopril 40mg  daily, Spironolactone 25mg  daily  HLD: 10 year ASCVD risk score is 58.1%. Lipid panel: Chol 172, TG 218, HDL 27, LDL 101 - Lipitor 40mg  daily  Depression/anxiety, stable: - Continue home meds: Seroquel 100mg  qhs, Reglan 10mg  tid   FEN/GI: NPO (sips with meds) and advance diet as tolerated, MIVFs with 25mEq KCl, Colace and Miralax for constipation Prophylaxis: Lovenox, Protonix  Disposition: Home pending improvement in pain.  Subjective:  Pt states she is having increased chest and abdominal pain after eating breakfast. She has no other concerns today.  Objective: Temp:  [97.7 F (36.5 C)-98.5 F (36.9 C)] 97.7 F (36.5 C) (01/21 0422) Pulse Rate:  [82-84] 84 (01/21 0422) Resp:  [18] 18 (01/21 0422) BP: (121-140)/(52-70) 140/52 mmHg (01/21 0422) SpO2:  [96 %-98 %] 98 % (01/21 0422) Weight:  [216 lb (97.977 kg)] 216 lb (97.977 kg) (01/21 0422) Physical Exam: General: Appearing to be in pain, talking softly Eyes: EOMI, PERRLA ENTM: Thrush present on tongue, MMM Neck: Supple Cardiovascular: RRR, no murmurs, tenderness to palpation of sternum Respiratory: Normal work of breathing, mild expiratory wheezing, somewhat decreased air movement throughout. Abdomen: +BS, soft, non-distended, tenderness to palpation of the epigastric area. MSK: Warm and well-perfused, moves all extremities spontaneously, no edema Skin: No rashes or lesions, dry bandage present over right scapular area Neuro: Awake, alert, CN 2-12 grossly intact, no focal  deficits Psych: Appropriate affect, normal behavior  Laboratory:  Recent Labs Lab 05/10/15 2230 05/11/15 0442  WBC 6.8 6.7  HGB 15.5* 14.2  HCT 46.5* 43.2  PLT 161 169    Recent Labs Lab 05/10/15 2230 05/11/15 0442  NA 142 141  K  3.0* 2.8*  CL 99* 99*  CO2 28 28  BUN <5* 6  CREATININE 0.78 1.22*  CALCIUM 8.9 8.5*  GLUCOSE 132* 227*   Troponins 0.12 > 0.12 > 0.12 Lipase 259  Imaging/Diagnostic Tests: None  Sela Hua, MD 05/12/2015, 7:21 AM PGY-1, [place] Intern pager: [phone], text pages welcome

## 2015-05-13 DIAGNOSIS — J441 Chronic obstructive pulmonary disease with (acute) exacerbation: Secondary | ICD-10-CM

## 2015-05-13 DIAGNOSIS — J449 Chronic obstructive pulmonary disease, unspecified: Secondary | ICD-10-CM | POA: Insufficient documentation

## 2015-05-13 DIAGNOSIS — Z720 Tobacco use: Secondary | ICD-10-CM

## 2015-05-13 DIAGNOSIS — K8500 Idiopathic acute pancreatitis without necrosis or infection: Secondary | ICD-10-CM

## 2015-05-13 LAB — BASIC METABOLIC PANEL
Anion gap: 11 (ref 5–15)
BUN: 10 mg/dL (ref 6–20)
CO2: 26 mmol/L (ref 22–32)
Calcium: 8.7 mg/dL — ABNORMAL LOW (ref 8.9–10.3)
Chloride: 106 mmol/L (ref 101–111)
Creatinine, Ser: 0.75 mg/dL (ref 0.44–1.00)
GFR calc Af Amer: >60 mL/min (ref >60)
GFR calc non Af Amer: >60 mL/min (ref >60)
Glucose, Bld: 120 mg/dL — ABNORMAL HIGH (ref 65–99)
Potassium: 4 mmol/L (ref 3.5–5.1)
Sodium: 143 mmol/L (ref 135–145)

## 2015-05-13 LAB — GLUCOSE, CAPILLARY
Glucose-Capillary: 96 mg/dL (ref 65–99)
Glucose-Capillary: 111 mg/dL — ABNORMAL HIGH (ref 65–99)
Glucose-Capillary: 245 mg/dL — ABNORMAL HIGH (ref 65–99)
Glucose-Capillary: 345 mg/dL — ABNORMAL HIGH (ref 65–99)

## 2015-05-13 MED ORDER — POTASSIUM CHLORIDE IN NACL 20-0.9 MEQ/L-% IV SOLN
INTRAVENOUS | Status: DC
  Administered 2015-05-14 (×2): via INTRAVENOUS
  Filled 2015-05-13 (×2): qty 1000

## 2015-05-13 MED ORDER — DM-GUAIFENESIN ER 30-600 MG PO TB12
1.0000 | ORAL_TABLET | Freq: Two times a day (BID) | ORAL | Status: DC
  Administered 2015-05-13 – 2015-05-14 (×3): 1 via ORAL
  Filled 2015-05-13 (×3): qty 1

## 2015-05-13 NOTE — Progress Notes (Signed)
Family Medicine Teaching Service Daily Progress Note Intern Pager: [phone]  Patient name: [name] [name] Medical record number: [id number] Date of birth: [birth year] Age: 65 y.o. Gender: female  Primary Care Provider: [name], DO Consultants: Cardiology Code Status: Full  Pt Overview and Major Events to Date:  1/20: Admitted to FMTS for ACS rule out.  Assessment and Plan: [name] [name] is a 65 y.o. female presenting with chest pain, here for ACS rule out. PMH is significant for CAD s/p cath in 2013, HTN, T2DM, HFpEF (EF 65-70%, G2DD), asthma, GERD, depression/anxiety, chronic pain, pancreatitis, tobacco abuse.  Chest pain secondary to mild acute pancreatitis:  Pain is mild this morning - Advance to clears today - Tylenol and Morphine 1mg  q2hrs prn for pain, Zofran for nausea - If patient is unable to advance her diet then may need to consider addition of Creon  ACS rule out: Resolved - Cardiology consulted for chest pain. Believe her chest pain is all secondary to the pancreatitis. Recommend outpatient nuclear stress test.  - Continue daily ASA 81mg  and Statin - Supplemental oxygen as needed to maintain sats >94% - Vitals per unit routine.  COPD/asthma exacerbation. Afebrile. On exam, lungs with mild expiratory wheezing. Uses Albuterol and Qvar 77mcg 2 puffs bid at home. - Duonebs q4hrs, q2hrs prn - Pulmicort nebs bid - Blood cultures pending - Tessalon tid prn for cough - Prednisone 50mg  daily x 5 days  Hypokalemia: Resolved  Thrush: Noted on exam. - Nystatin swish and swallow PO 4 times a day  HTN:BPs improving.  - Continue home meds: Norvasc 5mg  daily, Coreg 25mg  bid, HCTZ 25mg  daily, Lisinopril 40mg  daily - Hydralazine 5mg  prn for SBP > 180, DBP > 110.  T2DM: Takes Lantus 40 units bid and Humalog 15 units bid at home. Last HgbA1c was 8.0%. - Will keep Lantus at 20 units today, since we are making her NPO - CBGs q4hrs  HFpEF: Last ECHO 05/2014: EF  65-70%, G2DD. - Continue home meds: Coreg 25mg  bid, Lasix 20mg  every other day, Lisinopril 40mg  daily, Spironolactone 25mg  daily  HLD: 10 year ASCVD risk score is 58.1%. Lipid panel: Chol 172, TG 218, HDL 27, LDL 101 - Lipitor 40mg  daily  Depression/anxiety, stable: - Continue home meds: Seroquel 100mg  qhs, Reglan 10mg  tid  FEN/GI: Saline lock, clear liquid diet, Colace and Miralax for constipation Prophylaxis: Lovenox, Protonix  Disposition: Home pending improvement in pain.  Subjective:  She reports a mild centralized abdominal pain. Reports a long history of foul-smelling stool and clumpy in nature.  Objective: Temp:  [98 F (36.7 C)-98.8 F (37.1 C)] 98 F (36.7 C) (01/22 0355) Pulse Rate:  [77-79] 77 (01/22 0355) Resp:  [18-20] 18 (01/22 0355) BP: (134-151)/(61-68) 151/68 mmHg (01/22 0355) SpO2:  [92 %-98 %] 92 % (01/22 0916) Weight:  [217 lb 3.2 oz (98.521 kg)] 217 lb 3.2 oz (98.521 kg) (01/22 0355) Physical Exam: General: Appearing to be in pain, talking softly ENTM: Thrush present on tongue, MMM Neck: Supple Cardiovascular: RRR, no murmurs, tenderness to palpation of sternum Respiratory: Normal work of breathing, mild expiratory wheezing, somewhat decreased air movement throughout. Abdomen: +BS, soft, non-distended, tenderness to palpation of the epigastric area. MSK: Warm and well-perfused, moves all extremities spontaneously, no edema Skin: No rashes or lesions Neuro: Awake, no focal deficits Psych: Appropriate affect, normal behavior  Laboratory:  Recent Labs Lab 05/10/15 2230 05/11/15 0442  WBC 6.8 6.7  HGB 15.5* 14.2  HCT 46.5* 43.2  PLT 161 169    Recent  Labs Lab 05/11/15 0442 05/12/15 1025 05/13/15 0523  NA 141 139 143  K 2.8* 4.1 4.0  CL 99* 99* 106  CO2 28 25 26   BUN 6 14 10   CREATININE 1.22* 0.98 0.75  CALCIUM 8.5* 9.1 8.7*  GLUCOSE 227* 244* 120*   Troponins 0.12 > 0.12 > 0.12 Lipase 259  Imaging/Diagnostic Tests: None  Rosemarie Ax, MD 05/13/2015, 10:09 AM PGY-3, [place] Intern pager: [phone], text pages welcome

## 2015-05-13 NOTE — Progress Notes (Signed)
Pt refused bed alarm at this time, pt A/Ox4 and sitting on side of bed, pt states she will call for help to get OOB

## 2015-05-14 LAB — GLUCOSE, CAPILLARY
Glucose-Capillary: 156 mg/dL — ABNORMAL HIGH (ref 65–99)
Glucose-Capillary: 226 mg/dL — ABNORMAL HIGH (ref 65–99)
Glucose-Capillary: 329 mg/dL — ABNORMAL HIGH (ref 65–99)

## 2015-05-14 MED ORDER — BECLOMETHASONE DIPROPIONATE 80 MCG/ACT IN AERS: 2.0000 | INHALATION_SPRAY | Freq: Two times a day (BID) | RESPIRATORY_TRACT | Status: DC

## 2015-05-14 MED ORDER — ONDANSETRON HCL 4 MG PO TABS: 4.0000 mg | ORAL_TABLET | Freq: Three times a day (TID) | ORAL | Status: DC | PRN

## 2015-05-14 MED ORDER — ALBUTEROL SULFATE HFA 108 (90 BASE) MCG/ACT IN AERS: 2.0000 | INHALATION_SPRAY | RESPIRATORY_TRACT | Status: DC | PRN

## 2015-05-14 MED ORDER — OXYCODONE HCL 5 MG PO TABS: 5.0000 mg | ORAL_TABLET | Freq: Four times a day (QID) | ORAL | Status: DC | PRN

## 2015-05-14 MED ORDER — INSULIN GLARGINE 100 UNIT/ML ~~LOC~~ SOLN: 20.0000 [IU] | Freq: Two times a day (BID) | SUBCUTANEOUS | Status: DC

## 2015-05-14 MED ORDER — PREDNISONE 50 MG PO TABS: 50.0000 mg | ORAL_TABLET | Freq: Every day | ORAL | Status: DC

## 2015-05-14 MED ORDER — INSULIN GLARGINE 100 UNIT/ML SOLOSTAR PEN: 20.0000 [IU] | PEN_INJECTOR | Freq: Every day | SUBCUTANEOUS | Status: DC

## 2015-05-14 MED ORDER — FUROSEMIDE 20 MG PO TABS: 20.0000 mg | ORAL_TABLET | ORAL | Status: DC

## 2015-05-14 MED ORDER — NYSTATIN 100000 UNIT/ML MT SUSP: 5.0000 mL | Freq: Three times a day (TID) | OROMUCOSAL | Status: DC

## 2015-05-14 MED ORDER — ATORVASTATIN CALCIUM 40 MG PO TABS: 40.0000 mg | ORAL_TABLET | Freq: Every morning | ORAL | Status: DC

## 2015-05-14 MED ORDER — IPRATROPIUM-ALBUTEROL 0.5-2.5 (3) MG/3ML IN SOLN
3.0000 mL | RESPIRATORY_TRACT | Status: DC | PRN
Start: 2015-05-14 — End: 2015-05-14

## 2015-05-14 MED ORDER — INSULIN LISPRO 100 UNIT/ML (KWIKPEN): 15.0000 [IU] | PEN_INJECTOR | Freq: Two times a day (BID) | SUBCUTANEOUS | Status: DC | PRN

## 2015-05-14 NOTE — Clinical Documentation Improvement (Signed)
Family Medicine  Please clarify site of Thrush. Coders/CDI cannot make an assumption of any diagnosis. Please document findings in next progress note; NOT in BPA drop down box. Thanks!   Other  Clinically Undetermined  Supporting Information:  Thrush: Noted on exam.  - Nystatin swish and swallow PO 4 times a day   Please exercise your independent, professional judgment when responding. A specific answer is not anticipated or expected.  Thank You, Zoila Shutter RN, BSN, [place] [phone]; Cell: [phone]

## 2015-05-14 NOTE — Progress Notes (Signed)
Patient discharged per orders. Patient's grandson at the bedside for d/c teaching/instructions. Prescriptions, medications, follow up appointments discussed with time allowed for questions and concerns. Patient transported to the main entrance via wheelchair to wait with grandson for another family member to pick them up. Patient given the choice to wait in room for ride to arrive and patient refused. Roselyn Reef Teagyn Fishel,RN

## 2015-05-14 NOTE — Discharge Instructions (Signed)
You were hospitalized for chest pain. We were worried you were having problems with your heart. All of your heart tests showed [place] that your heart was fine, but you were having pancreatitis. We let your pancreas rest and then slowly started allowing you to eat. I have prescribed some Oxycodone 5mg  every 6 hours as needed for pain, as well as Zofran 4mg  every 8 hours as needed for nausea.   - We would like you to increase your Lipitor dose from 20mg  daily to 40mg  daily. I have sent a new prescription into your pharmacy. This medicine will help prevent future heart attacks and strokes - When you go home, please take Lantus 20 units twice a day (instead of 40 units twice a day) since you aren't eating as much as you normally do. Dr. Parks Ranger will adjust this dose as needed at your hospital follow-up appointment.  - You should continue to take the Humalog as you were before you were hospitalized.

## 2015-05-14 NOTE — Clinical Documentation Improvement (Signed)
Family Medicine  The medical record includes the following unapproved [place] abbreviation: HFpEF: Last ECHO 05/2014: EF 65-70%, G2DD  Please document the associated medical diagnosis including the acuity of related to the above abbreviation. Document findings in next progress note; NOT in BPA drop down box. Thanks!  Please exercise your independent, professional judgment when responding. A specific answer is not anticipated or expected.  Thank You, Zoila Shutter RN, BSN, [place] [phone]; Cell: [phone]

## 2015-05-14 NOTE — Progress Notes (Signed)
Family Medicine Teaching Service Daily Progress Note Intern Pager: [phone]  Patient name: [name] [name] Medical record number: [id number] Date of birth: Feb 10, 1951 Age: 65 y.o. Gender: female  Primary Care Provider: [name], DO Consultants: Cardiology Code Status: Full  Pt Overview and Major Events to Date:  1/20: Admitted to FMTS for ACS rule out.  Assessment and Plan: [name] [name] is a 65 y.o. female presenting with chest pain, here for ACS rule out. PMH is significant for CAD s/p cath in 2013, HTN, T2DM, HFpEF (EF 65-70%, G2DD), asthma, GERD, depression/anxiety, chronic pain, pancreatitis, tobacco abuse.  Acute pancreatitis, mild:  Pain is improved this morning. - Advance to soft diet today.  - Tylenol and Morphine 1mg  q2hrs prn for pain, Zofran for nausea - If patient is unable to advance her diet then may need to consider addition of Creon.  ACS rule out: Resolved. - Cardiology consulted for chest pain. Believe her chest pain is all secondary to the pancreatitis. Recommend outpatient nuclear stress test.  - Continue daily ASA 81mg  and Statin - Supplemental oxygen as needed to maintain sats >94% - Vitals per unit routine.  COPD/asthma exacerbation, resolved. Afebrile. On exam, lungs are clear. Uses Albuterol and Qvar 56mcg 2 puffs bid at home. - Duonebs q4hrs prn - Pulmicort nebs bid - Tessalon tid prn for cough - Prednisone 50mg  daily x 5 days (end date: 1/24) - PFTs as an outpatient  Thrush: Noted on exam. - Nystatin swish and swallow PO 4 times a day  HTN:BPs 156/69 this morning  - Continue home meds: Norvasc 5mg  daily, Coreg 25mg  bid, HCTZ 25mg  daily, Lisinopril 40mg  daily, Spironolactone 25mg  daily. - Hydralazine 5mg  prn for SBP > 180, DBP > 110. - If she continues to have high blood pressures, will consider increasing Norvasc dose to 10mg  daily.  T2DM: Takes Lantus 40 units bid and Humalog 15 units bid at home. Last HgbA1c was 8.0%. CBGs up to  345 last night and 156 this morning. - Lantus 20 units bid - Moderate SSI - CBGs three times daily with meals and at bedtime  HFpEF: Last ECHO 05/2014: EF 65-70%, G2DD. - Continue home meds: Coreg 25mg  bid, Lasix 20mg  every other day, Lisinopril 40mg  daily, Spironolactone 25mg  daily  HLD: 10 year ASCVD risk score is 58.1%. Lipid panel: Chol 172, TG 218, HDL 27, LDL 101 - Lipitor 40mg  daily  Depression/anxiety, stable: - Continue home meds: Seroquel 100mg  qhs, Reglan 10mg  tid  FEN/GI: Saline lock, clear liquid diet, Colace and Miralax for constipation Prophylaxis: Lovenox, Protonix  Disposition: Home possibly today if she can tolerate a soft diet.  Subjective:  Pt states she is doing well today. She is still having some pain, but it is better. She was able to tolerate clear liquids this morning. She would like to try a soft diet for lunch. She had a BM yesterday. She is ready to go home today.  Objective: Temp:  [97.8 F (36.6 C)-98.5 F (36.9 C)] 97.8 F (36.6 C) (01/23 0533) Pulse Rate:  [71-81] 77 (01/23 0533) Resp:  [18-20] 18 (01/23 0533) BP: (126-156)/(68-70) 156/69 mmHg (01/23 0533) SpO2:  [92 %-98 %] 98 % (01/23 0533) Weight:  [216 lb 0.8 oz (98 kg)] 216 lb 0.8 oz (98 kg) (01/23 0533) Physical Exam: General: Sitting up on side of bed, eating ice cream, in NAD. ENTM: Thrush present on tongue, MMM Neck: Supple Cardiovascular: RRR, no murmurs Respiratory: Normal work of breathing, CTAB Abdomen: +BS, soft, non-distended, tenderness to palpation of the  epigastric area. MSK: Warm and well-perfused, moves all extremities spontaneously, no edema Skin: No rashes or lesions Neuro: Awake, no focal deficits Psych: Appropriate affect, normal behavior  Laboratory:  Recent Labs Lab 05/10/15 2230 05/11/15 0442  WBC 6.8 6.7  HGB 15.5* 14.2  HCT 46.5* 43.2  PLT 161 169    Recent Labs Lab 05/11/15 0442 05/12/15 1025 05/13/15 0523  NA 141 139 143  K 2.8* 4.1 4.0  CL  99* 99* 106  CO2 28 25 26   BUN 6 14 10   CREATININE 1.22* 0.98 0.75  CALCIUM 8.5* 9.1 8.7*  GLUCOSE 227* 244* 120*   Troponins 0.12 > 0.12 > 0.12 Lipase 259 Blood cultures: no growth x 2 days  Imaging/Diagnostic Tests: None  Sela Hua, MD 05/14/2015, 6:30 AM PGY-1, [place] Intern pager: [phone], text pages welcome

## 2015-05-15 ENCOUNTER — Ambulatory Visit: Payer: Medicare Other | Admitting: Pharmacist

## 2015-05-16 ENCOUNTER — Telehealth: Payer: Self-pay | Admitting: Family Medicine

## 2015-05-16 ENCOUNTER — Ambulatory Visit (INDEPENDENT_AMBULATORY_CARE_PROVIDER_SITE_OTHER): Payer: Medicare Other | Admitting: Family Medicine

## 2015-05-16 ENCOUNTER — Inpatient Hospital Stay: Payer: Medicare Other | Admitting: Family Medicine

## 2015-05-16 ENCOUNTER — Encounter: Payer: Self-pay | Admitting: Family Medicine

## 2015-05-16 VITALS — BP 160/78 | HR 76 | Temp 98.7°F | Ht 65.0 in | Wt 217.7 lb

## 2015-05-16 DIAGNOSIS — Z720 Tobacco use: Secondary | ICD-10-CM

## 2015-05-16 DIAGNOSIS — R0789 Other chest pain: Secondary | ICD-10-CM

## 2015-05-16 DIAGNOSIS — K8500 Idiopathic acute pancreatitis without necrosis or infection: Secondary | ICD-10-CM

## 2015-05-16 DIAGNOSIS — J189 Pneumonia, unspecified organism: Secondary | ICD-10-CM

## 2015-05-16 DIAGNOSIS — J309 Allergic rhinitis, unspecified: Secondary | ICD-10-CM

## 2015-05-16 DIAGNOSIS — K861 Other chronic pancreatitis: Secondary | ICD-10-CM

## 2015-05-16 DIAGNOSIS — I25119 Atherosclerotic heart disease of native coronary artery with unspecified angina pectoris: Secondary | ICD-10-CM

## 2015-05-16 DIAGNOSIS — J441 Chronic obstructive pulmonary disease with (acute) exacerbation: Secondary | ICD-10-CM | POA: Diagnosis not present

## 2015-05-16 DIAGNOSIS — K859 Acute pancreatitis without necrosis or infection, unspecified: Secondary | ICD-10-CM

## 2015-05-16 DIAGNOSIS — I10 Essential (primary) hypertension: Secondary | ICD-10-CM

## 2015-05-16 DIAGNOSIS — IMO0002 Reserved for concepts with insufficient information to code with codable children: Secondary | ICD-10-CM

## 2015-05-16 LAB — CULTURE, BLOOD (ROUTINE X 2)
Culture: NO GROWTH
Culture: NO GROWTH

## 2015-05-16 MED ORDER — OXYCODONE HCL 5 MG PO TABS: 5.0000 mg | ORAL_TABLET | Freq: Four times a day (QID) | ORAL | Status: DC | PRN

## 2015-05-16 MED ORDER — LEVOFLOXACIN 750 MG PO TABS: 750.0000 mg | ORAL_TABLET | Freq: Every day | ORAL | Status: DC

## 2015-05-16 MED ORDER — FLUTICASONE PROPIONATE 50 MCG/ACT NA SUSP: 2.0000 | Freq: Every day | NASAL | Status: DC

## 2015-05-16 MED ORDER — ONDANSETRON HCL 4 MG PO TABS: 4.0000 mg | ORAL_TABLET | Freq: Three times a day (TID) | ORAL | Status: DC | PRN

## 2015-05-16 NOTE — Assessment & Plan Note (Addendum)
Consistent with unresolved mild-mod acute exacerbation of COPD with worsening productive cough, had CAP in 03/2015 with partial resolution to levaquin. Currently however no wheezing, seemed to respond to prednisone x 5 days. - No hypoxia (96% on RA), afebrile - Continues Albuterol, Qvar  Plan: 1. Levaquin 750mg  x 7 days 2. Use albuterol q 4 hr regularly x 2-3 days. Continue maintenance inhalers 3. RTC about 1 week if not improving, otherwise strict return criteria to go to ED 4. Smoking cessation counseling today

## 2015-05-16 NOTE — Assessment & Plan Note (Signed)
Start flonase, may try OTC claritin / zyrtec

## 2015-05-16 NOTE — Telephone Encounter (Signed)
Woman calling for the CT scan scheduled and states it needs to state "with" not "with and without". I tried to get more information, but there must have been bad connection as I couldn't hear the other person very well. Sadie Reynolds, ASA

## 2015-05-16 NOTE — Assessment & Plan Note (Signed)
Again, significantly elevated initially SBP >200, manual re-check SBP down to 160s. Active abdominal pain and frequent coughing. Meds - Coreg 25mg  BID, HCTZ 25mg  daily, Lisinopril 40mg  daily, Amlodipine 5mg  daily, Spiro 25mg  daily, Lasix 20mg  PRN No complications   Plan:  1. Continue current BP meds - no changes, again difficult to adjust with acute illness today affecting BP likely, and reduced on manual re-checks 2. Lifestyle Mods - Smoking cessation, Exercise, Dec salt intake 3. Monitor BP at home or at drug store occasionally 4. Recommend future referral Dr Valentina Lucks pharmacy clinic for ambulatory BP monitoring in future

## 2015-05-16 NOTE — Assessment & Plan Note (Signed)
Clinically similar to prior CAP in 03/2015 (partial response to levaquin x 5 days) vs COPD exac that is unresolved seems most likely treated in hospital with only prednisone x 5 days, now wheezing resolved but productive sputum and fevers. Did not receive IV antibiotics in hospital, does not meet criteria for HCAP, but significant high risk patient with multiple chronic co-morbidities. - No hypoxia, >96% on RA, temp normal but subjective fevers/sweats still. Also no tachycardia is reassuring. Differential also includes acute bacterial sinusitis with some tenderness, but seems more likely allergic  Plan: 1. Repeat empiric course with Levaquin 750mg  x 7 days - again hold erythromycin x 7 days while on Levaquin 2. Improve hydration 3. Return criteria given, follow-up as needed

## 2015-05-16 NOTE — Progress Notes (Signed)
Subjective:    Patient ID: [name] [name], female    DOB: 10/12/1950, 65 y.o.   MRN: [id number]  [name] [name] is a 65 y.o. female presenting on 05/16/2015 for Hospitalization Follow-up  [place] - Admitted 05/10/15 for Chest Pain (Rule out MI), Acute Pancreatitis, Discharged 05/14/15  ACUTE PANCREATITIS / ABDOMINAL PAIN - Chronic known history of recurrent pancreatitis episodes over few years. S/p cholecystectomy. No active alcohol consumption, prior history of some alcohol use, but ultimately considered to be "idiopathic". - Symptoms of pancreatitis for past 2 weeks, gradually worsening, stated that in hospital some improvement with pain medicine and NPO, after found Lipase 259. - Gradual improved abdominal pain, but still severe pain if eats regular food, even after leaving hospital - Last nausea vomiting yesterday. Taking Zofran with relief of nausea. Taking Oxycodone IR 5mg  every 6 hours as needed was given 12 tabs, only has a couple left - Last CT abdomen imaging 2015. None obtained in hospital - Admits to fevers/sweats - Denies constipation, diarrhea, blood in stool, hematemesis, unintentional weight loss  COPD EXACERBATION / CAP: - Last treatment for CAP in 03/2015 with Levaquin 750mg  x 5 days with improvement but then worsened again, treated with prednisone 50mg  x 5 days in hospital for COPD exacerbation - Today complaining of productive cough with thick sputum, still with some shortness of breath on activity, - Using albuterol inhaler 2 puffs twice daily with some relief, Qvar 81mcg 2 puffs twice daily - oral thrush is improved today, thought to be from inhaled steroids - Denies active chest pain or shortness of breath at rest  Chest Pain ruled out MI / Elevated ASCVD - She was admitted for chest pain with atypical features, to rule out MI. Known prior history of non-obstructive CAD. Consulted cardiology, lab tests with troponins mild low elevation. No acute  ischemic events on EKG. - ASA 81mg  daily and increased Lipitor from 20 to 40mg  daily - Today she is chest pain free but still has generalized discomfort, some reproducible on lower left chest wall and upper abdomen  CHRONIC HTN: Reports has had elevated BP persistently in and outside of hospital Current Meds - Spirolactone 20mg , Lisinopril 40mg , Amlodipine 5mg , HCTZ 25mg  daily Reports good compliance, took meds today. Tolerating well, w/o complaints. Lifestyle - not active, no regular exercise Admits HA Denies CP, dyspnea, edema, dizziness / lightheadedness   Social History   Social History  . Marital Status: Single    Spouse Name: N/A  . Number of Children: N/A  . Years of Education: N/A   Occupational History  . Not on file.   Social History Main Topics  . Smoking status: Current Some Day Smoker -- 0.20 packs/day for 30 years    Types: Cigarettes    Start date: 04/22/1975  . Smokeless tobacco: Former Systems developer     Comment: started at age 74 - quit for several years.  Restarted with death of child.  recently quit for days at a time.  currently reports 0-3 cigs per day  . Alcohol Use: No  . Drug Use: No  . Sexual Activity: No   Other Topics Concern  . Not on file   Social History Narrative    Review of Systems Per HPI unless specifically indicated above     Objective:    BP 160/78 mmHg  Pulse 76  Temp(Src) 98.7 F (37.1 C) (Oral)  Ht 5\' 5"  (1.651 m)  Wt 217 lb 11.2 oz (98.748 kg)  BMI 36.23  kg/m2  SpO2 96%  Wt Readings from Last 3 Encounters:  05/16/15 217 lb 11.2 oz (98.748 kg)  05/14/15 216 lb 0.8 oz (98 kg)  04/04/15 223 lb (101.152 kg)    Physical Exam  Constitutional: She is oriented to person, place, and time. She appears well-developed and well-nourished. No distress.  Chronically ill appearing but non-toxic, cooperative  HENT:  Head: Normocephalic and atraumatic.  Mouth/Throat: Oropharynx is clear and moist.  Mild tenderness bilateral frontal  sinuses, nares with edematous turbinates and some congestion but no purulence  Eyes: Conjunctivae are normal. Pupils are equal, round, and reactive to light.  Neck: Neck supple.  Cardiovascular: Normal rate, regular rhythm, normal heart sounds and intact distal pulses.   No murmur heard. Pulmonary/Chest: Effort normal and breath sounds normal. No respiratory distress. She has no wheezes. She has no rales.  Some reduced air movement, likely effort, otherwise no active wheezing or focal findings.  Abdominal: Soft. Bowel sounds are normal. She exhibits no distension and no mass. There is tenderness (generalized, but worst LUQ and upper quads). There is no rebound and no guarding.  Obese  Musculoskeletal: She exhibits no edema.  Lymphadenopathy:    She has no cervical adenopathy.  Neurological: She is alert and oriented to person, place, and time.  Skin: Skin is warm and dry. No rash noted. She is not diaphoretic.  Psychiatric: She has a normal mood and affect. Her behavior is normal.  Nursing note and vitals reviewed.      Assessment & Plan:   Problem List Items Addressed This Visit    Acute pancreatitis   Relevant Medications   oxyCODONE (OXY IR/ROXICODONE) 5 MG immediate release tablet   ondansetron (ZOFRAN) 4 MG tablet   Other Relevant Orders   CT Abdomen Pelvis W Wo Contrast   Ambulatory referral to Gastroenterology   Allergic rhinitis    Start flonase, may try OTC claritin / zyrtec      Relevant Medications   fluticasone (FLONASE) 50 MCG/ACT nasal spray   CAP (community acquired pneumonia)    Clinically similar to prior CAP in 03/2015 (partial response to levaquin x 5 days) vs COPD exac that is unresolved seems most likely treated in hospital with only prednisone x 5 days, now wheezing resolved but productive sputum and fevers. Did not receive IV antibiotics in hospital, does not meet criteria for HCAP, but significant high risk patient with multiple chronic co-morbidities. -  No hypoxia, >96% on RA, temp normal but subjective fevers/sweats still. Also no tachycardia is reassuring. Differential also includes acute bacterial sinusitis with some tenderness, but seems more likely allergic  Plan: 1. Repeat empiric course with Levaquin 750mg  x 7 days - again hold erythromycin x 7 days while on Levaquin 2. Improve hydration 3. Return criteria given, follow-up as needed      Relevant Medications   levofloxacin (LEVAQUIN) 750 MG tablet   fluticasone (FLONASE) 50 MCG/ACT nasal spray   Chest pain    Resolved. Most consistent with atypical. Referral to outpatient Cards for further ischemic work-up with h/o non-obstructive CAD      Relevant Orders   Ambulatory referral to Cardiology   COPD exacerbation ([place])    Consistent with unresolved mild-mod acute exacerbation of COPD with worsening productive cough, had CAP in 03/2015 with partial resolution to levaquin. Currently however no wheezing, seemed to respond to prednisone x 5 days. - No hypoxia (96% on RA), afebrile - Continues Albuterol, Qvar  Plan: 1. Levaquin 750mg  x 7 days 2.  Use albuterol q 4 hr regularly x 2-3 days. Continue maintenance inhalers 3. RTC about 1 week if not improving, otherwise strict return criteria to go to ED 4. Smoking cessation counseling today       Relevant Medications   levofloxacin (LEVAQUIN) 750 MG tablet   fluticasone (FLONASE) 50 MCG/ACT nasal spray   Coronary artery disease involving native coronary artery of native heart with angina pectoris (HCC)    Known history of non-obstructive CAD. Currently CP has resolved, now more atypical with persistent abdominal pain with recurrent pancreatitis. - Followed by Cardiology in hospital for rule out MI - Elevated ASCVD risk score >58%  Plan: 1. Referral to Cardiology placed as recommended for outpatient stress and further ischemic work-up 2. Continue ASA 81mg  daily, continue increased atorvastatin to 40mg  daily      Relevant Orders     Ambulatory referral to Cardiology   HYPERTENSION, BENIGN ESSENTIAL    Again, significantly elevated initially SBP >200, manual re-check SBP down to 160s. Active abdominal pain and frequent coughing. Meds - Coreg 25mg  BID, HCTZ 25mg  daily, Lisinopril 40mg  daily, Amlodipine 5mg  daily, Spiro 25mg  daily, Lasix 20mg  PRN No complications   Plan:  1. Continue current BP meds - no changes, again difficult to adjust with acute illness today affecting BP likely, and reduced on manual re-checks 2. Lifestyle Mods - Smoking cessation, Exercise, Dec salt intake 3. Monitor BP at home or at drug store occasionally 4. Recommend future referral Dr Valentina Lucks pharmacy clinic for ambulatory BP monitoring in future      Relevant Orders   Ambulatory referral to Cardiology   Pancreatitis, recurrent ([place]) - Primary    Consistent with recurrent acute on chronic pancreatitis, s/p hospitalization with lipase to 259 which is impressive increase, less suggestive of severe chronic disease. No imaging CT in hospital, last in 2015. No clear triggers, no alcohol and s/p choley. - Still not improved after discharge, difficulty tolerating solids, persistent pains, some fevers with productive cough  Plan: 1. Ordered CT Abdomen / Pelvis w contrast (IV, Oral) scheduled for 05/22/15, she is to pick up oral contrast ahead of time, will follow-up with imaging, goal to rule out pseudocyst or complications of pancreatitis, also eval for calcifications or chronicity of pancreatitis or other etiology for symptoms 2. Referral to GI for further work-up with chronic recurrent pancreatitis, also GERD on PPI/H2 3. Refilled Oxy IR 5mg , #30 pills 0 refill 4. Refilled Zofran PRN 5. Strict return criteria if worsening abdominal pain, understands when to go to ED      Relevant Medications   oxyCODONE (OXY IR/ROXICODONE) 5 MG immediate release tablet   ondansetron (ZOFRAN) 4 MG tablet   Other Relevant Orders   CT Abdomen Pelvis W Wo Contrast    Ambulatory referral to Gastroenterology   Tobacco abuse      Meds ordered this encounter  Medications  . levofloxacin (LEVAQUIN) 750 MG tablet    Sig: Take 1 tablet (750 mg total) by mouth daily. X 7 days    Dispense:  7 tablet    Refill:  0  . oxyCODONE (OXY IR/ROXICODONE) 5 MG immediate release tablet    Sig: Take 1 tablet (5 mg total) by mouth every 6 (six) hours as needed for severe pain.    Dispense:  30 tablet    Refill:  0  . ondansetron (ZOFRAN) 4 MG tablet    Sig: Take 1 tablet (4 mg total) by mouth every 8 (eight) hours as needed for nausea or  vomiting.    Dispense:  30 tablet    Refill:  1  . fluticasone (FLONASE) 50 MCG/ACT nasal spray    Sig: Place 2 sprays into both nostrils daily.    Dispense:  16 g    Refill:  6      Follow up plan: Return in about 4 weeks (around 06/13/2015) for diabetes, blood pressure.  [name], [place], PGY-3

## 2015-05-16 NOTE — Assessment & Plan Note (Signed)
Consistent with recurrent acute on chronic pancreatitis, s/p hospitalization with lipase to 259 which is impressive increase, less suggestive of severe chronic disease. No imaging CT in hospital, last in 2015. No clear triggers, no alcohol and s/p choley. - Still not improved after discharge, difficulty tolerating solids, persistent pains, some fevers with productive cough  Plan: 1. Ordered CT Abdomen / Pelvis w contrast (IV, Oral) scheduled for 05/22/15, she is to pick up oral contrast ahead of time, will follow-up with imaging, goal to rule out pseudocyst or complications of pancreatitis, also eval for calcifications or chronicity of pancreatitis or other etiology for symptoms 2. Referral to GI for further work-up with chronic recurrent pancreatitis, also GERD on PPI/H2 3. Refilled Oxy IR 5mg , #30 pills 0 refill 4. Refilled Zofran PRN 5. Strict return criteria if worsening abdominal pain, understands when to go to ED

## 2015-05-16 NOTE — Assessment & Plan Note (Signed)
Resolved. Most consistent with atypical. Referral to outpatient Cards for further ischemic work-up with h/o non-obstructive CAD

## 2015-05-16 NOTE — Patient Instructions (Signed)
Thank you for coming in to clinic today.  1. Pancreatitis - Ordered a CT Scan of Abdomen to evaluate your pancreas further - May continue Oxycodone for pain, Zofran for nausea - Advance diet slowly as tolerated - Referral to GI specialist for pancreatitis  2. Chest Pain - Referral to Cardiologist for further stress test work-up - Please try to cut back or quit smoking  3. Blood pressure - Keep taking meds, re-checked today with some improvement to 160/78  4. COPD / Pneumonia - Start new course of Levaquin 750mg  daily x 7 days - Finish prednisone - use Albuterol inhaler 2 puffs every 4 to 6 hours as needed for now - Keep using Qvar twice daily  Please schedule a follow-up appointment with Dr Parks Ranger or Dr Valentina Lucks within 4 weeks to follow-up Diabetes.  If you have any other questions or concerns, please feel free to call the clinic to contact me. You may also schedule an earlier appointment if necessary.  However, if your symptoms get significantly worse, please go to the Emergency Department to seek immediate medical attention.  [name], [place]

## 2015-05-16 NOTE — Assessment & Plan Note (Signed)
Known history of non-obstructive CAD. Currently CP has resolved, now more atypical with persistent abdominal pain with recurrent pancreatitis. - Followed by Cardiology in hospital for rule out MI - Elevated ASCVD risk score >58%  Plan: 1. Referral to Cardiology placed as recommended for outpatient stress and further ischemic work-up 2. Continue ASA 81mg  daily, continue increased atorvastatin to 40mg  daily

## 2015-05-17 NOTE — Telephone Encounter (Signed)
Called [facility] Radiology CT back today, spoke with Claiborne Billings who was able to assist. She will change the order, to CT Abdomen Pelvis W contrast (IV and oral) and not without anymore. This is not needed for pancreas. They will keep scheduled apt. CT will also contact patient today regarding options for picking up oral contrast, may just arrive early if can't get SCAT to pick it up fri or mon before tues imaging.  No further action needed.  [name], [place], PGY-3

## 2015-05-17 NOTE — Telephone Encounter (Signed)
Spoke to dr Parks Ranger, he will work on changing the order. Marcey Persad Kennon Holter, CMA

## 2015-05-22 ENCOUNTER — Ambulatory Visit (HOSPITAL_COMMUNITY)
Admission: RE | Admit: 2015-05-22 | Discharge: 2015-05-22 | Disposition: A | Discharge status: Home / Self Care | Payer: Medicare Other | Source: Ambulatory Visit | Attending: Family Medicine | Admitting: Family Medicine

## 2015-05-22 ENCOUNTER — Encounter (HOSPITAL_COMMUNITY): Payer: Self-pay

## 2015-05-22 DIAGNOSIS — K760 Fatty (change of) liver, not elsewhere classified: Secondary | ICD-10-CM | POA: Insufficient documentation

## 2015-05-22 DIAGNOSIS — R110 Nausea: Secondary | ICD-10-CM | POA: Diagnosis not present

## 2015-05-22 DIAGNOSIS — R1084 Generalized abdominal pain: Secondary | ICD-10-CM | POA: Insufficient documentation

## 2015-05-22 DIAGNOSIS — K429 Umbilical hernia without obstruction or gangrene: Secondary | ICD-10-CM | POA: Insufficient documentation

## 2015-05-22 DIAGNOSIS — K8500 Idiopathic acute pancreatitis without necrosis or infection: Secondary | ICD-10-CM | POA: Diagnosis not present

## 2015-05-22 DIAGNOSIS — K861 Other chronic pancreatitis: Secondary | ICD-10-CM | POA: Insufficient documentation

## 2015-05-22 DIAGNOSIS — I700 Atherosclerosis of aorta: Secondary | ICD-10-CM | POA: Diagnosis not present

## 2015-05-22 DIAGNOSIS — K859 Acute pancreatitis without necrosis or infection, unspecified: Secondary | ICD-10-CM

## 2015-05-22 HISTORY — DX: Heart failure, unspecified: I50.9

## 2015-05-22 MED ORDER — IOHEXOL 300 MG/ML  SOLN
80.0000 mL | Freq: Once | INTRAMUSCULAR | Status: AC | PRN
  Administered 2015-05-22: 80 mL via INTRAVENOUS

## 2015-05-25 ENCOUNTER — Telehealth: Payer: Self-pay | Admitting: Family Medicine

## 2015-05-25 NOTE — Telephone Encounter (Signed)
Last apt with me 05/16/15 for hospital follow-up with recurrent pancreatitis, see office note for more details. Briefly, ordered outpatient CT Abd / Pelvis w contrast for eval of chronic recurrent pancreatitis with abdominal pain, nausea, and elevated lipase. CT scan obtained on 1/31. See radiologist impression below:  1/31 CT Abd / Pelvis w contrast IMPRESSION: 1. No acute findings or explanation for the patient's symptoms. No evidence of recurrent pancreatitis. 2. Stable supraumbilical hernia containing only fat. 3. Stable postsurgical changes status post cholecystectomy and hysterectomy. 4. Stable mild hepatic steatosis, mild atherosclerosis and chronic distal esophageal wall thickening. Minimal pericardial fluid.  Reassuring CT results without evidence of acute pancreatitis or any concerns for complications (pseudocysts, necrosis, mass etc). However, I am perplexed as to etiology of her symptoms. She was referred to GI as well back on 05/16/15, there seem to be limited options for her, and I will now await their further work-up and evaluation.  I attempted to contact patient to provide CT Scan results. Unable to reach her on 05/24/15 and 05/25/15. Voicemail does not seem to belong personally to her, therefore I did not leave a message.  If patient returns call back or calls to request results of CT Abdomen, please share with her that results did NOT [employer], ACUTE OR CHRONIC. This is reassuring, however, I do not have any other treatment recommendations. Next step is wait to establish with GI (referred placed 05/16/15).  [name], [place], PGY-3

## 2015-06-01 ENCOUNTER — Encounter: Payer: Self-pay | Admitting: Cardiovascular Disease

## 2015-07-13 ENCOUNTER — Other Ambulatory Visit: Payer: Self-pay | Admitting: Family Medicine

## 2015-07-13 DIAGNOSIS — L299 Pruritus, unspecified: Secondary | ICD-10-CM

## 2015-07-13 DIAGNOSIS — G894 Chronic pain syndrome: Secondary | ICD-10-CM

## 2015-07-13 MED ORDER — PREGABALIN 150 MG PO CAPS: ORAL_CAPSULE | ORAL | Status: DC

## 2015-07-13 NOTE — Telephone Encounter (Signed)
Phoned in rx to [facility], unable to reach pharmacist, left voicemail message, rx Pregabalin (Lyrica) 150mg  tabs take 1 in morning, 1 in afternoon, and 2 in evening, total of 4 tabs in 24 hours (#120 tabs, +2 refills).  [name], [place], PGY-3

## 2015-07-13 NOTE — Telephone Encounter (Signed)
Pt needs refill on lyrica and hydroxine. She needs them refilled today because she doesn't have enough to make it thru the weekend

## 2015-07-16 ENCOUNTER — Encounter: Payer: Self-pay | Admitting: Pharmacist

## 2015-07-16 ENCOUNTER — Encounter: Payer: Self-pay | Admitting: Family Medicine

## 2015-07-16 ENCOUNTER — Ambulatory Visit (INDEPENDENT_AMBULATORY_CARE_PROVIDER_SITE_OTHER): Payer: Medicare Other | Admitting: Family Medicine

## 2015-07-16 ENCOUNTER — Ambulatory Visit (INDEPENDENT_AMBULATORY_CARE_PROVIDER_SITE_OTHER): Payer: Medicare Other | Admitting: Pharmacist

## 2015-07-16 VITALS — BP 179/75 | HR 96 | Wt 214.8 lb

## 2015-07-16 VITALS — BP 164/68 | HR 102 | Temp 98.5°F | Wt 217.0 lb

## 2015-07-16 DIAGNOSIS — M5441 Lumbago with sciatica, right side: Secondary | ICD-10-CM | POA: Insufficient documentation

## 2015-07-16 DIAGNOSIS — M5442 Lumbago with sciatica, left side: Secondary | ICD-10-CM | POA: Diagnosis not present

## 2015-07-16 DIAGNOSIS — Z720 Tobacco use: Secondary | ICD-10-CM

## 2015-07-16 DIAGNOSIS — M4807 Spinal stenosis, lumbosacral region: Secondary | ICD-10-CM | POA: Insufficient documentation

## 2015-07-16 DIAGNOSIS — J441 Chronic obstructive pulmonary disease with (acute) exacerbation: Secondary | ICD-10-CM

## 2015-07-16 DIAGNOSIS — G894 Chronic pain syndrome: Secondary | ICD-10-CM | POA: Diagnosis not present

## 2015-07-16 DIAGNOSIS — K3184 Gastroparesis: Secondary | ICD-10-CM

## 2015-07-16 DIAGNOSIS — J302 Other seasonal allergic rhinitis: Secondary | ICD-10-CM

## 2015-07-16 DIAGNOSIS — I10 Essential (primary) hypertension: Secondary | ICD-10-CM | POA: Diagnosis not present

## 2015-07-16 DIAGNOSIS — E1143 Type 2 diabetes mellitus with diabetic autonomic (poly)neuropathy: Secondary | ICD-10-CM

## 2015-07-16 DIAGNOSIS — R1032 Left lower quadrant pain: Secondary | ICD-10-CM

## 2015-07-16 MED ORDER — MOMETASONE FURO-FORMOTEROL FUM 200-5 MCG/ACT IN AERO: 2.0000 | INHALATION_SPRAY | Freq: Two times a day (BID) | RESPIRATORY_TRACT | Status: DC

## 2015-07-16 MED ORDER — SPIRONOLACTONE 25 MG PO TABS: 25.0000 mg | ORAL_TABLET | Freq: Every day | ORAL | Status: DC

## 2015-07-16 MED ORDER — PREDNISONE 20 MG PO TABS: ORAL_TABLET | ORAL | Status: DC

## 2015-07-16 MED ORDER — CETIRIZINE HCL 10 MG PO TABS: 10.0000 mg | ORAL_TABLET | Freq: Every day | ORAL | Status: DC

## 2015-07-16 MED ORDER — OXYCODONE HCL 5 MG PO TABS: 5.0000 mg | ORAL_TABLET | ORAL | Status: DC | PRN

## 2015-07-16 MED ORDER — AMLODIPINE BESYLATE 5 MG PO TABS: 5.0000 mg | ORAL_TABLET | Freq: Every day | ORAL | Status: DC

## 2015-07-16 NOTE — Assessment & Plan Note (Signed)
Stable to elevated today Saw pharmacy clinic earlier today for ambulatory BP monitoring cuff, has in place, f/u as planned

## 2015-07-16 NOTE — Progress Notes (Signed)
Subjective:    Patient ID: [name] [name], female    DOB: 1950-11-11, 65 y.o.   MRN: [id number]  [name] [name] is a 65 y.o. female presenting on 07/16/2015 for Abdominal Pain and Back Pain  HPI  LEFT LOW BACK PAIN / LEG PAIN: - Last seen on 04/04/15 for Chronic Pain Syndrome with complex prior history. She was referred to Pain Clinic, unable to establish due to not having the $50 co-pay at that time, is awaiting this but hopes to schedule this apt soon. - Describes left low back pain as constant, 10/10 pain without relief, worse with activity and ambulation, with associated radiation down left leg to toes, which also seems constant. Prior improved on oxycodone. - Taking Lyrica 150mg  AM and PM, and 300mg  QHS - Last imaging with Lumbar MRI 07/2009 with multiple level lumbar DJD with some neural foraminal stenosis L3-L4 and L4-L5, prior disc protrusion L5-S1, consistent with facet arthropathy DJD, also on L-spine x-ray last done in 2010. - Denies any focal numbness, weakness, bowel or bladder incontinence or retention, fall or trauma / injury  PRODUCTIVE COUGH / COPD EXACERBATION - Last hospitalization 1/19 to 05/14/2015 for CP ruled out MI and acute pancreatitis. Treated for CAP in 03/2015 with levaquin and repeat dose levaquin x 7 days on 05/16/15 for recurrent episode COPD vs CAP. She does think she got better after this last course, but then now recurrent similar illness. - Reports symptoms started about 4-5 weeks ago with viral URI and multiple sick contacts with similar URI symptoms at that time. Her symptoms with productive "thick green" cough seem to be worsening recently. - Using albuterol inhaler 2-3 times daily. Has not been using Qvar since recent hospitalization in 04/2015 states she never picked this up from [place] to fevers/chills/sweats last episode 3 days ago, not taking Tylenol / NSAIDs - Admits to some shortness of breath, dizziness, wheezing - Denies active chest pain  or shortness of breath at rest  [place] / DM GASTROPARESIS: - Chronic known history of recurrent pancreatitis episodes over few years. S/p cholecystectomy. No active alcohol consumption, prior history of some alcohol use, but ultimately considered to be "idiopathic". Last imaging with CT Abdomen/Pelvis w contrast done 05/22/2015, showed no cause of abdominal pain, no acute or chronic/recurrent pancreatitis. She was referred back to GI on 05/16/15. - Currently complains of bilateral lower abdominal pain constant, not as severe as prior pancreatitis episodes, not worse after eating. Unclear triggers. Some associated nausea without vomiting. Prior relief with oxycodone. Taking Zofran PRN.  - Denies constipation, diarrhea, blood in stool, hematemesis, unintentional weight loss   Social History  Substance Use Topics  . Smoking status: Current Some Day Smoker -- 0.20 packs/day for 30 years    Types: Cigarettes    Start date: 04/22/1975  . Smokeless tobacco: Former Systems developer     Comment: started at age 28 - quit for several years.  Restarted with death of child.  recently quit for days at a time.  currently reports 0-3 cigs per day. recent in crease to 1/2 pack d/t death in family  . Alcohol Use: No     Review of Systems Per HPI unless specifically indicated above     Objective:    BP 164/68 mmHg  Pulse 102  Temp(Src) 98.5 F (36.9 C) (Oral)  Wt 217 lb (98.431 kg)  SpO2 95%  Wt Readings from Last 3 Encounters:  07/16/15 217 lb (98.431 kg)  07/16/15 214 lb 12.8  oz (97.433 kg)  05/16/15 217 lb 11.2 oz (98.748 kg)    Physical Exam  Constitutional: She appears well-developed and well-nourished. No distress.  Morbidly obese, chronically ill appearing but non-toxic, cooperative. Has Left arm ambulatory BP monitoring cuff in place.  HENT:  Head: Normocephalic and atraumatic.  Nose: Nose normal.  Mouth/Throat: Oropharynx is clear and moist.  Sinuses non-tender. Nares with  edematous turbinates and some congestion but no purulence  Cardiovascular: Regular rhythm, normal heart sounds and intact distal pulses.   No murmur heard. Tachycardic  Pulmonary/Chest: Effort normal. No respiratory distress. She has wheezes (scattered exp wheezes bilateral). She has no rales.  Some reduced air movement.  Abdominal: Soft. Bowel sounds are normal. She exhibits no distension and no mass. There is tenderness (bilateral lower quads, LEFT > Right). There is no rebound and no guarding.  Obese  Musculoskeletal: She exhibits no edema.  Low Back Inspection: obese, no deformity Palpation: Mild tender to palpation over mid to left low back, not over spinous process, some paraspinal muscle hypertonicity ROM: Limited due to pain extension / forward flexion Special Testing: Seated SLR with pain in back, left thigh, and some reported pain down into foot seems consistent with radicular pain Strength: 5/5 distal extremities ankle dorsiflexion Neurovascular: distally intact   Neurological: She is alert.  Skin: Skin is warm and dry. No rash noted. She is not diaphoretic.  Nursing note and vitals reviewed.      Assessment & Plan:   Problem List Items Addressed This Visit    Abdominal pain    Lower abdominal pain, likely with chronic abdominal pain and history of DM Gastroparesis. No acute abdomen or focal findings today. No GI red flags. - Last CT abdomen 05/22/2015 unremarkable for etiology of chronic pain or no evidence of recurrent pancreatitis - Continue treatment for DM gastroparesis - Follow-up GI (referred on 05/16/15, follow-up referral status if not scheduled soon)      Allergic rhinitis    Likely contributing to URI / cough, and now with COPD exac - Discontinue Benadryl, polypharmacy, concern with DM gastroparesis - Start Cetirizine 10mg  daily, no 2nd gen anti-histamines covered by ins, ideally considered fexofenadine but she has tried before and not improved      Relevant  Medications   cetirizine (ZYRTEC) 10 MG tablet   Chronic pain syndrome    Acute on chronic worsening of chronic pain syndrome, now with left LBP and history of radicular symptoms, prior known neuropathic pain and fibromyalgia. Complicated by recurrent pancreatitis.  Plan: 1. Continue Lyrica 150 AM / PM and 300mg  QHS 2. Given repeat temporary Oxycodone IR 5mg  q 4-6 hr PRN pain, #60, 0 refills, while patient is awaiting establish with Pain Management (family will give her the $50 copay to get this appointment now), she understands we do not plan for long-term opiate medications here at [facility] for her pain, and concern with chronic gastroparesis and polypharmacy      Relevant Medications   predniSONE (DELTASONE) 20 MG tablet   oxyCODONE (OXY IR/ROXICODONE) 5 MG immediate release tablet   COPD exacerbation (HCC) - Primary    Recurrent mild COPD exac (inc productive cough and wheezing), unclear if fully resolved since 04/2015, also history of URI / allergies as another trigger. - No hypoxia (95% on RA), afebrile - Continues Albuterol (using regularly), not using Qvar (never started from 04/2015, not covered by ins)  Plan: 1. Considered antibiotics, however afebrile and no hypoxia, would not be indicated in mild AECOPD (also  recurrent antibiotic courses already x 2 in past 4 months) 2. Start Prednisone longer taper for COPD and LBP, 40mg  x 4 day, 20mg  x 4 day, 10mg  x 4 day 3. Use albuterol q 4 hr regularly x 2-3 days 4. Given sample Dulera in office, and DC Qvar, ordered new rx Dulera can pick up if improved on sample 2 puffs BID, difficult situation with limited ins coverage and patient unable to pay co-pay for meds 5. Smoking cessation 6. RTC 1-2 weeks follow-up COPD, return criteria given      Relevant Medications   cetirizine (ZYRTEC) 10 MG tablet   predniSONE (DELTASONE) 20 MG tablet   mometasone-formoterol (DULERA) 200-5 MCG/ACT AERO   Diabetic gastroparesis associated with type 2 diabetes  mellitus ([place])    Symptoms consistent with likely DM gastroparesis with some abdominal pain, chronically - Continue erythromycin - Discontinue Benadryl, trial on Cetirizine - Follow-up GI      HYPERTENSION, BENIGN ESSENTIAL    Stable to elevated today Saw pharmacy clinic earlier today for ambulatory BP monitoring cuff, has in place, f/u as planned      Relevant Medications   spironolactone (ALDACTONE) 25 MG tablet   amLODipine (NORVASC) 5 MG tablet   Left-sided low back pain with left-sided sciatica    Consistent with acute on chronic L LBP with associated L sciatica. Secondary to chronic lumbar DJD with facet arthropathy and spinal stenosis. No acute injury. - No red flag symptoms. Positive Left SLR for radiculopathy  Plan: 1. Start prednisone taper x 12 days, 40 x 4, 20 x 4, 10 x 4 days (also for COPD) 2. Given temporary rx Oxycodone IR 5mg  q 4-6 hr PRN #60, 0 refills, pending scheduling with Pain Clinic (now will have $50 co-pay, last referred in 04/2015) 3. May use Tylenol PRN 4. Encouraged use of heating pad 1-2x daily for now then PRN 5. RTC 2-4 weeks follow-up, limited options but may benefit from future epidural injections from pain management, or consider ortho       Relevant Medications   predniSONE (DELTASONE) 20 MG tablet   oxyCODONE (OXY IR/ROXICODONE) 5 MG immediate release tablet      Meds ordered this encounter  Medications  . spironolactone (ALDACTONE) 25 MG tablet    Sig: Take 1 tablet (25 mg total) by mouth daily.    Dispense:  30 tablet    Refill:  5  . amLODipine (NORVASC) 5 MG tablet    Sig: Take 1 tablet (5 mg total) by mouth daily.    Dispense:  30 tablet    Refill:  5  . cetirizine (ZYRTEC) 10 MG tablet    Sig: Take 1 tablet (10 mg total) by mouth daily.    Dispense:  30 tablet    Refill:  5  . predniSONE (DELTASONE) 20 MG tablet    Sig: Take 2 tablets daily (40mg ) for 4 days, take 1 tab daily (20mg ) for 4 days, take half tab daily (10mg ) for 4  days    Dispense:  14 tablet    Refill:  0  . mometasone-formoterol (DULERA) 200-5 MCG/ACT AERO    Sig: Inhale 2 puffs into the lungs 2 (two) times daily.    Dispense:  13 g    Refill:  2  . oxyCODONE (OXY IR/ROXICODONE) 5 MG immediate release tablet    Sig: Take 1 tablet (5 mg total) by mouth every 4 (four) hours as needed for severe pain.    Dispense:  60 tablet  Refill:  0      Follow up plan: Return in about 2 weeks (around 07/30/2015), or if symptoms worsen or fail to improve, for back pain, COPD follow-up.  [name], [place], PGY-3

## 2015-07-16 NOTE — Assessment & Plan Note (Signed)
Acute on chronic worsening of chronic pain syndrome, now with left LBP and history of radicular symptoms, prior known neuropathic pain and fibromyalgia. Complicated by recurrent pancreatitis.  Plan: 1. Continue Lyrica 150 AM / PM and 300mg  QHS 2. Given repeat temporary Oxycodone IR 5mg  q 4-6 hr PRN pain, #60, 0 refills, while patient is awaiting establish with Pain Management (family will give her the $50 copay to get this appointment now), she understands we do not plan for long-term opiate medications here at [facility] for her pain, and concern with chronic gastroparesis and polypharmacy

## 2015-07-16 NOTE — Patient Instructions (Signed)
Thank you for coming in to clinic today.  1. Take Prednisone taper over next 12 days as instructed, 2 tabs for 4 days, then 1 tab for 4 days, then half tab for 4 days, this will help your low back pain, leg pain, and improve your breathing 2. For cough, take predniosone, start new Dulera inhaler 2 puffs twice a day EVERYday, use Albuterol rescue inhaler only as needed every 4 to 6 hours (now for next few days you can use this regularly, then use less often once breathing better) - Start Mucinex over the counter twice a day for 5 to 7 days as well - Improve hydration 3. For allergies, stop benadryl. Start Cetirizine or Zyrtec (none of these seem to be covered by insurance, so try a one month trial every day 10mg  to see if this helps allergies and itching, if need can resume hydroxyzine within a few weeks for itching if worsening. NO BENADRYL.  Given oxycodone for chronic back pain, can take this 1-2x a day as needed, however no further refills for now, as we are trying to get you into the Pain Management clinic, let us know if you need a repeat referral, however they should schedule you if you have the copay.  If worsening fevers/chills, difficulty breathing, productive cough, chest pain, shortness of breath, abdominal pain, nausea, vomiting, may return sooner or go to Emergency Department  Please schedule a follow-up appointment with Dr Parks Ranger within 2 weeks to follow-up Breathing / Back Pain - or if improved, can follow-up in 4 to 6 weeks for Diabetes  If you have any other questions or concerns, please feel free to call the clinic to contact me. You may also schedule an earlier appointment if necessary.  However, if your symptoms get significantly worse, please go to the Emergency Department to seek immediate medical attention.  [name], [place]

## 2015-07-16 NOTE — Assessment & Plan Note (Signed)
Lower abdominal pain, likely with chronic abdominal pain and history of DM Gastroparesis. No acute abdomen or focal findings today. No GI red flags. - Last CT abdomen 05/22/2015 unremarkable for etiology of chronic pain or no evidence of recurrent pancreatitis - Continue treatment for DM gastroparesis - Follow-up GI (referred on 05/16/15, follow-up referral status if not scheduled soon)

## 2015-07-16 NOTE — Assessment & Plan Note (Signed)
Recurrent mild COPD exac (inc productive cough and wheezing), unclear if fully resolved since 04/2015, also history of URI / allergies as another trigger. - No hypoxia (95% on RA), afebrile - Continues Albuterol (using regularly), not using Qvar (never started from 04/2015, not covered by ins)  Plan: 1. Considered antibiotics, however afebrile and no hypoxia, would not be indicated in mild AECOPD (also recurrent antibiotic courses already x 2 in past 4 months) 2. Start Prednisone longer taper for COPD and LBP, 40mg  x 4 day, 20mg  x 4 day, 10mg  x 4 day 3. Use albuterol q 4 hr regularly x 2-3 days 4. Given sample Dulera in office, and DC Qvar, ordered new rx Dulera can pick up if improved on sample 2 puffs BID, difficult situation with limited ins coverage and patient unable to pay co-pay for meds 5. Smoking cessation 6. RTC 1-2 weeks follow-up COPD, return criteria given

## 2015-07-16 NOTE — Assessment & Plan Note (Signed)
Likely contributing to URI / cough, and now with COPD exac - Discontinue Benadryl, polypharmacy, concern with DM gastroparesis - Start Cetirizine 10mg  daily, no 2nd gen anti-histamines covered by ins, ideally considered fexofenadine but she has tried before and not improved

## 2015-07-16 NOTE — Progress Notes (Signed)
S:    Patient arrives to clinic in pleasant mood. Presents to the clinic for ambulatory blood pressure evaluation.  Patient was referred on 05/16/15.  Patient was last seen by Primary Care Provider on 05/16/15. Diagnosed with Hypertension >10 years ago.  Medication compliance is reported to be suboptimal. Patient reports only taking her carvedilol once a day instead of the prescribed twice daily dosing. She had also yet to take her amlodipine for the day.  Discussed procedure for wearing the monitor and gave patient written instructions. Monitor was placed on non-dominant arm with instructions to return in the morning.   Current BP Medications include: amlodipine, carvedilol, spironolactone, HCTZ, lisinopril, lasix   Patient returned to the clinic and reported she had difficulty with cuff staying in place. Reported taking all blood pressure pills as requested.  Also had a low stress day at home.    O:  Last 3 Office BP readings: BP Readings from Last 3 Encounters:  07/16/15 179/75  05/16/15 160/78  05/14/15 149/68     ABPM Study Data: Arm Placement left arm  Only captured 16% of attempted BP readings.     Overall Mean 24hr BP:   197/86  mmHg HR: 95   Daytime Mean BP:  200/85  mmHg HR: 99   Nighttime Mean BP:  188/90  mmHg HR: 83   Dipping Pattern: No.  Sys:   <10%    Dia: < 10%   [normal dipping ~10-20%]   Non-hypertensive ABPM thresholds: daytime BP <135/85 mmHg, sleeptime BP <120/70 mmHg NICE Hypertension Guidelines ([place]) using ABPM: Stage I: >135/85 mmHg, Stage 2: >150/95 mmHg)   BMET    Component Value Date/Time   NA 143 05/13/2015 0523   K 4.0 05/13/2015 0523   CL 106 05/13/2015 0523   CO2 26 05/13/2015 0523   GLUCOSE 120* 05/13/2015 0523   BUN 10 05/13/2015 0523   CREATININE 0.75 05/13/2015 0523   CREATININE 0.65 11/17/2014 1633   CALCIUM 8.7* 05/13/2015 0523   GFRNONAA >60 05/13/2015 0523   GFRNONAA 68 09/23/2013 1647   GFRAA >60 05/13/2015 0523   GFRAA 79  09/23/2013 1647    A/P: Instructed the patient to take her carvedilol twice a day and to take her amlodipine dose for the day she missed for that day.  She reported taking all BP medications as prescribed for the day of testing.  History of hypertension found to have isolated systolic hypertension given 24-hour ambulatory blood pressure demonstrates systolic readings consistently [id number] and Diastolic < 90 with an average blood pressure of 197/86 mmHg, and a nocturnal dipping pattern that is abnormal.  Changes to medications include reemphasis to take carvedilol 25mg  twice daily - as previously prescribed. Additionally, we will increase dose of spironolactone to 50mg  daily.   New prescription for both carvedilol and spironolactone sent to pharmacy.  Repeat BMET in 1 week.   Tobacco Cessation - chronic abuse of tobacco longstanding.  Encouraged consideration for another tobacco quit attempt.  Patient currently struggling emotionally with a death in the family.  Will consider tobacco quit attempt in the future.    Results reviewed and written information provided.  Total time in face-to-face counseling 35 minutes.   F/U Clinic Visit with Dr. Valentina Lucks in 1 week.   Patient seen with Rudolpho Sevin, PharmD Candidate, and Nuala Alpha, PharmD Resident.

## 2015-07-16 NOTE — Assessment & Plan Note (Signed)
Consistent with acute on chronic L LBP with associated L sciatica. Secondary to chronic lumbar DJD with facet arthropathy and spinal stenosis. No acute injury. - No red flag symptoms. Positive Left SLR for radiculopathy  Plan: 1. Start prednisone taper x 12 days, 40 x 4, 20 x 4, 10 x 4 days (also for COPD) 2. Given temporary rx Oxycodone IR 5mg  q 4-6 hr PRN #60, 0 refills, pending scheduling with Pain Clinic (now will have $50 co-pay, last referred in 04/2015) 3. May use Tylenol PRN 4. Encouraged use of heating pad 1-2x daily for now then PRN 5. RTC 2-4 weeks follow-up, limited options but may benefit from future epidural injections from pain management, or consider ortho

## 2015-07-16 NOTE — Assessment & Plan Note (Addendum)
Symptoms consistent with likely DM gastroparesis with some abdominal pain, chronically - Continue erythromycin - Discontinue Benadryl, trial on Cetirizine - Follow-up GI

## 2015-07-16 NOTE — Patient Instructions (Addendum)
Blood pressure monitor shows continued high readings.   Continue Carvedilol Twice daily.   Increase your Spironolatone to 2 pills daily. (50mg  total).    Follow up in 1 week Pharmacy Clinic

## 2015-07-17 ENCOUNTER — Encounter: Payer: Self-pay | Admitting: Pharmacist

## 2015-07-17 MED ORDER — CARVEDILOL 25 MG PO TABS: 25.0000 mg | ORAL_TABLET | Freq: Two times a day (BID) | ORAL | Status: DC

## 2015-07-17 MED ORDER — ACCU-CHEK FASTCLIX LANCETS MISC: Status: DC

## 2015-07-17 MED ORDER — SPIRONOLACTONE 25 MG PO TABS: 50.0000 mg | ORAL_TABLET | Freq: Every day | ORAL | Status: DC

## 2015-07-17 NOTE — Assessment & Plan Note (Signed)
Instructed the patient to take her carvedilol twice a day and to take her amlodipine dose for the day she missed for that day.  She reported taking all BP medications as prescribed for the day of testing.  History of hypertension found to have isolated systolic hypertension given 24-hour ambulatory blood pressure demonstrates systolic readings consistently [id number] and Diastolic < 90 with an average blood pressure of 197/86 mmHg, and a nocturnal dipping pattern that is abnormal.  Changes to medications include reemphasis to take carvedilol 25mg  twice daily - as previously prescribed. Additionally, we will increase dose of spironolactone to 50mg  daily.   New prescription for both carvedilol and spironolactone sent to pharmacy.

## 2015-07-17 NOTE — Assessment & Plan Note (Signed)
Tobacco Cessation - chronic abuse of tobacco longstanding.  Encouraged consideration for another tobacco quit attempt.  Patient currently struggling emotionally with a death in the family.  Will consider tobacco quit attempt in the future.

## 2015-07-18 NOTE — Progress Notes (Signed)
Patient ID: [name] [name], female   DOB: 04/06/1951, 64 y.o.   MRN: [id number] Reviewed: Agree with Dr. Koval's documentation and management. 

## 2015-07-26 ENCOUNTER — Encounter: Payer: Self-pay | Admitting: Pharmacist

## 2015-07-26 ENCOUNTER — Ambulatory Visit (INDEPENDENT_AMBULATORY_CARE_PROVIDER_SITE_OTHER): Payer: Medicare Other | Admitting: Pharmacist

## 2015-07-26 VITALS — BP 153/73 | HR 79 | Wt 216.6 lb

## 2015-07-26 DIAGNOSIS — E1100 Type 2 diabetes mellitus with hyperosmolarity without nonketotic hyperglycemic-hyperosmolar coma (NKHHC): Secondary | ICD-10-CM

## 2015-07-26 DIAGNOSIS — I10 Essential (primary) hypertension: Secondary | ICD-10-CM | POA: Diagnosis not present

## 2015-07-26 LAB — BASIC METABOLIC PANEL
BUN: 16 mg/dL (ref 7–25)
CO2: 26 mmol/L (ref 20–31)
Calcium: 9.4 mg/dL (ref 8.6–10.4)
Chloride: 100 mmol/L (ref 98–110)
Creat: 0.84 mg/dL (ref 0.50–0.99)
Glucose, Bld: 241 mg/dL — ABNORMAL HIGH (ref 65–99)
Potassium: 4.2 mmol/L (ref 3.5–5.3)
Sodium: 138 mmol/L (ref 135–146)

## 2015-07-26 MED ORDER — INSULIN GLARGINE 100 UNIT/ML SOLOSTAR PEN: 40.0000 [IU] | PEN_INJECTOR | Freq: Two times a day (BID) | SUBCUTANEOUS | Status: DC

## 2015-07-26 MED ORDER — AMLODIPINE BESYLATE 5 MG PO TABS: 5.0000 mg | ORAL_TABLET | Freq: Two times a day (BID) | ORAL | Status: DC

## 2015-07-26 NOTE — Patient Instructions (Signed)
No change in your medications today.   Lab test on your way out.   Next visit with Dr. Raliegh Ip - already.

## 2015-07-26 NOTE — Assessment & Plan Note (Signed)
Diabetes:  Patient reports adherence with medication.  Unable to assess control as patient has not been monitoring blood glucose at home.  Patient report she ran out of lancets.  Provided patient with sample lancets.  Advised patient to begin monitoring blood glucose and bring readings to visit with PCP on 08/06/15.   Continued basal insulin Lantus (insulin glargine) 40 units BID. Continued rapid insulin Humalog (insulin lispro) 15 units before meals.  Next A1C anticipated at next visit.

## 2015-07-26 NOTE — Progress Notes (Addendum)
S:    Patient arrives in good spirits.  Presents for blood pressure evaluation, education, and management at the request of Dr. Parks Ranger.  Patient was referred on 05/16/15.  Patient was last seen by Primary Care Provider on 07/16/15.   Patient reports Hypertension was diagnosed >10 years ago.   Patient reports adherence with medications.  Current hypertension medications include: amlodipine, carvedilol, HCTZ, lisinopril, furosemide, spironolactone Current diabetes medications include: Lantus, Humalog   O:  Lab Results  Component Value Date   HGBA1C 8.0 01/25/2015   Filed Vitals:   07/26/15 0958 07/26/15 0959  BP: 149/68 153/73  Pulse: 76 79    Home fasting CBG: patient has not been monitoring blood glucose    A/P: Hypertension longstanding currently improved.  Patient reports adherence with medication.  Patient reports she is now taking carvedilol 25 mg twice daily as instructed, is taking spironolactone 25 mg BID, and has started taking amlodipine twice daily as well.  Patient reports occasional dizziness when standing.  Will continue current regimen at this time.  BMET ordered.    Diabetes:  Patient reports adherence with medication.  Unable to assess control as patient has not been monitoring blood glucose at home.  Patient report she ran out of lancets.  Provided patient with sample lancets.  Advised patient to begin monitoring blood glucose and bring readings to visit with PCP on 08/06/15.   Continued basal insulin Lantus (insulin glargine) 40 units BID. Continued rapid insulin Humalog (insulin lispro) 15 units before meals.  Next A1C anticipated at next visit.    ASCVD risk greater than 7.5%. Continued Aspirin 81 mg and Continued atorvastatin 40 mg.   Patient reports she is currently taking Excedrine migraine two to three times daily for headache.  Advised patient to limit use of Excedrine migraine due to aspirin content and risk of GI bleed.    Written patient instructions  provided.  Total time in face to face counseling 20 minutes.  Follow up with Dr. Parks Ranger on 08/06/15.  Follow up with Pharmacy Clinic PRN.   Patient seen with Governor Specking, PharmD Resident and Elisabeth Most, PharmD Resident.     BMET - stable Cr and K Glucose elevated - Patient planned to test at home.  Consider reevaluation of home readings and adjustment of insulin regimen at next visit.

## 2015-07-26 NOTE — Assessment & Plan Note (Signed)
Hypertension longstanding currently improved.  Patient reports adherence with medication.  Patient reports she is now taking carvedilol 25 mg twice daily as instructed, is taking spironolactone 25 mg BID, and has started taking amlodipine twice daily as well.  Patient reports occasional dizziness when standing.  Will continue current regimen at this time.  BMET ordered.

## 2015-07-26 NOTE — Progress Notes (Signed)
Patient ID: [name] [name], female   DOB: 03/11/1951, 64 y.o.   MRN: [id number] Reviewed: Agree with Dr. Koval's documentation and management. 

## 2015-08-06 ENCOUNTER — Ambulatory Visit (INDEPENDENT_AMBULATORY_CARE_PROVIDER_SITE_OTHER): Payer: Medicare Other | Admitting: Family Medicine

## 2015-08-06 ENCOUNTER — Encounter: Payer: Self-pay | Admitting: Family Medicine

## 2015-08-06 VITALS — BP 155/69 | HR 102 | Temp 98.7°F | Ht 61.0 in | Wt 219.1 lb

## 2015-08-06 DIAGNOSIS — J441 Chronic obstructive pulmonary disease with (acute) exacerbation: Secondary | ICD-10-CM | POA: Diagnosis not present

## 2015-08-06 DIAGNOSIS — I10 Essential (primary) hypertension: Secondary | ICD-10-CM

## 2015-08-06 DIAGNOSIS — E1100 Type 2 diabetes mellitus with hyperosmolarity without nonketotic hyperglycemic-hyperosmolar coma (NKHHC): Secondary | ICD-10-CM

## 2015-08-06 DIAGNOSIS — J302 Other seasonal allergic rhinitis: Secondary | ICD-10-CM

## 2015-08-06 LAB — POCT GLYCOSYLATED HEMOGLOBIN (HGB A1C): Hemoglobin A1C: 11.4

## 2015-08-06 NOTE — Progress Notes (Signed)
Subjective:    Patient ID: [name] [name], female    DOB: [birth year], 65 y.o.   MRN: [id number]  [name] [name] is a 65 y.o. female presenting on 08/06/2015 for Follow-up  HPI  ALLERGIC RHINTIS / SEASONAL ALLERGIES: - Recent COPD exacerbation, last seen by me on 07/16/15 treated for mild COPD exac with prednisone taper, no antibiotics, without evidence more severe COPD. Did not pick up Cetirizine, but did discontinue benadryl as advised - Reports concerns with persistent allergies, congestion, but significantly improved breathing overall, resolved cough and dyspnea - Using nasal saline occasionally, and flonase regularly - Denies chest pain, dyspnea, productive cough, fever  CHRONIC DM, Type 2, / CHRONIC ABDOMINAL PAIN / DM Gastrparesis - Last [facility] visit 4/6 with Dr Valentina Lucks [facility] also for DM, similarly she did not bring her CBG logs or glucometer - Today admits elevated glucose for few weeks to months due to not adhering to DM diet, eating candy, chocolate, cake, and lots of fruit with oranges. Checks CBG 1-2x daily but does not keep log. Recalls glucose 200-300s. Meds: Lantus 40u BID. Humalog 15u BID (not taking TID, due to not eating regular meals), often skips meals and will eat lots of snacks between instead of full meals, including midnight snack Reports good compliance. Tolerating well w/o side-effects - Recently treated with prednisone course Currently on ACEi Denies hypoglycemia, polyuria, visual changes.  CHRONIC HTN: Last seen 3/27 and 4/6 at [facility] by Dr Valentina Lucks for resistant HTN with ambulatory BP monitoring. Concerns with polypharmacy and multiple drug regimens with prior non-adherence. See prior notes, BP monitoring confirmed elevated pressures (24 hr cuff SBP >180 and DBP < 90). Now seems improved on adherence to medications, changed to Coreg 25mg  BID, Amlodipine 5mg  BID, Spironolactone 25mg  BID, HCTZ 25mg  daily Reports good compliance, took meds today.  Tolerating well, w/o complaints. - Still smoking Denies HA, edema, dizziness / lightheadedness   Social History  Substance Use Topics  . Smoking status: Current Some Day Smoker -- 0.20 packs/day for 30 years    Types: Cigarettes    Start date: 04/22/1975  . Smokeless tobacco: Former Systems developer     Comment: started at age 13 - quit for several years.  Restarted with death of child.  recently quit for days at a time.  currently reports 0-3 cigs per day. recent in crease to 1/2 pack d/t death in family  . Alcohol Use: No     Review of Systems Per HPI unless specifically indicated above     Objective:    BP 155/69 mmHg  Pulse 102  Temp(Src) 98.7 F (37.1 C) (Oral)  Ht 5\' 1"  (1.549 m)  Wt 219 lb 1.6 oz (99.383 kg)  BMI 41.42 kg/m2  Wt Readings from Last 3 Encounters:  08/06/15 219 lb 1.6 oz (99.383 kg)  07/26/15 216 lb 9.6 oz (98.249 kg)  07/16/15 217 lb (98.431 kg)    Physical Exam  Constitutional: She appears well-developed and well-nourished. No distress.  Morbidly obese, chronically ill appearing but non-toxic, cooperative. Has Left arm ambulatory BP monitoring cuff in place.  HENT:  Head: Normocephalic and atraumatic.  Nose: Nose normal.  Mouth/Throat: Oropharynx is clear and moist.  Sinuses non-tender. Nares with mildly improved edematous turbinates with resolved congestion, no purulence.  Eyes: Conjunctivae are normal. Right eye exhibits no discharge. Left eye exhibits no discharge.  Neck: Normal range of motion. Neck supple.  Cardiovascular: Regular rhythm, normal heart sounds and intact distal pulses.   No murmur  heard. Mild Tachycardic  Pulmonary/Chest: Effort normal. No respiratory distress. She has no wheezes (resolved). She has no rales.  Improved air movement. Breathing comfortable. Speaks full sentences.  Musculoskeletal: She exhibits no edema.  Lymphadenopathy:    She has no cervical adenopathy.  Neurological: She is alert.  Skin: Skin is warm and dry. She is  not diaphoretic.  Nursing note and vitals reviewed.      Assessment & Plan:   Problem List Items Addressed This Visit    HYPERTENSION, BENIGN ESSENTIAL    Significantly improved BP, now approaching goal < 140/90. - Last BMET 4/6 on increased Spiro stable K and Cr  Plan: 1. Continue current regimen Coreg 25mg  BID, Amlodipine 5mg  BID, Spironolactone 25mg  BID, HCTZ 25mg  daily      DM (diabetes mellitus), type 2, uncontrolled (HCC) - Primary    Dramatic worsening control A1c 8 to 11.4, history suggests non-adherence to diet and insulin regimen. - Did not bring CBG log, difficult to adjust insulin  Plan: 1. Continue Lantus 40u BID 2. Advised needs to start eating 3 regular meals daily - instead of frequent snacking, advised to resume Novolog 10u TID WC, then can titrate back to 15u 3. Bring CBG logs, printed handout logs 4. Adhere to DM diet, advised start walking 5. RTC 6-8 weeks review CBGs - then 3 mo for A1c, consider additional treatments if not improved but seems to be non-adherence as previously better controlled      Relevant Orders   HgB A1c (Completed)   RESOLVED: COPD exacerbation ([place])    Resolved, finished prednisone.      Allergic rhinitis    Persistent, never started Cetirizine. Pick up rx Cetirizine 10mg  daily or try OTC (remain off Benadryl) Continue flonase Add nasal saline         No orders of the defined types were placed in this encounter.      Follow up plan: Return in about 6 weeks (around 09/17/2015) for diabetes.  [name], [place], PGY-3

## 2015-08-06 NOTE — Patient Instructions (Addendum)
Thank you for coming in to clinic today.  1. Allergies - Pick up Cetirizine (Zyrtec) 10mg  daily from pharmacy (may not be covered by insurance, may need to get over the counter Zyrtec 10mg  daily) - take every day for allergies (since we stopped Benadryl) - Continue Flonase everyday - Use Nasal Saline to help flush congestion (this is no medication, it is safe to use multiple times daily)  2. Blood Pressure - Improved, continue current regimen  3. Diabetes - Unfortunately elevated A1c today 11.4 (from prior 8.0), likely related to increased carbohydrate and sugar intake, also not taking the daily meal-time Humalog insulin - Continue Lantus 40 units twice daily (morning and night) - Start using Novolog 10 units with each meal, if your blood sugars remain >200 when checking, you can gradually increase up to 15 units with each meal (ONLY if you are eating 3 regular meals a day) - Stop midnight snacking as discussed  Bring in Glucose Log with blood sugars.  Please schedule a follow-up appointment with Dr Parks Ranger in 6 to 8 weeks to follow-up Diabetes, then next visit 3 months from now for Diabetes, A1c check  If you have any other questions or concerns, please feel free to call the clinic to contact me. You may also schedule an earlier appointment if necessary.  However, if your symptoms get significantly worse, please go to the Emergency Department to seek immediate medical attention.  [name], [place]

## 2015-08-06 NOTE — Assessment & Plan Note (Addendum)
Dramatic worsening control A1c 8 to 11.4, history suggests non-adherence to diet and insulin regimen. - Did not bring CBG log, difficult to adjust insulin  Plan: 1. Continue Lantus 40u BID 2. Advised needs to start eating 3 regular meals daily - instead of frequent snacking, advised to resume Novolog 10u TID WC, then can titrate back to 15u 3. Bring CBG logs, printed handout logs 4. Adhere to DM diet, advised start walking 5. RTC 6-8 weeks review CBGs - then 3 mo for A1c, consider additional treatments if not improved but seems to be non-adherence as previously better controlled

## 2015-08-06 NOTE — Assessment & Plan Note (Signed)
Resolved, finished prednisone.

## 2015-08-06 NOTE — Assessment & Plan Note (Addendum)
Persistent, never started Cetirizine. Pick up rx Cetirizine 10mg  daily or try OTC (remain off Benadryl) Continue flonase Add nasal saline

## 2015-08-06 NOTE — Assessment & Plan Note (Signed)
Significantly improved BP, now approaching goal < 140/90. - Last BMET 4/6 on increased Spiro stable K and Cr  Plan: 1. Continue current regimen Coreg 25mg  BID, Amlodipine 5mg  BID, Spironolactone 25mg  BID, HCTZ 25mg  daily

## 2015-10-22 ENCOUNTER — Telehealth: Payer: Self-pay | Admitting: *Deleted

## 2015-10-22 NOTE — Telephone Encounter (Signed)
[id number] phone / no answer/Konor Noren,BSN,RN3,CCM,CN

## 2015-10-29 ENCOUNTER — Other Ambulatory Visit: Payer: Self-pay | Admitting: *Deleted

## 2015-10-29 MED ORDER — INSULIN PEN NEEDLE 31G X 8 MM MISC: 1.0000 | Freq: Every day | Status: DC

## 2015-10-29 NOTE — Telephone Encounter (Signed)
Patient only has enough needles for 1 more use. Darnetta Kesselman,CMA

## 2015-11-06 ENCOUNTER — Other Ambulatory Visit: Payer: Self-pay | Admitting: *Deleted

## 2015-11-08 MED ORDER — ATORVASTATIN CALCIUM 40 MG PO TABS: 40.0000 mg | ORAL_TABLET | Freq: Every morning | ORAL | Status: DC

## 2015-11-19 ENCOUNTER — Other Ambulatory Visit: Payer: Self-pay | Admitting: *Deleted

## 2015-11-19 DIAGNOSIS — G894 Chronic pain syndrome: Secondary | ICD-10-CM

## 2015-11-20 MED ORDER — INSULIN PEN NEEDLE 31G X 8 MM MISC: 1.0000 | Freq: Every day | 99 refills | Status: DC

## 2015-11-20 MED ORDER — LISINOPRIL 40 MG PO TABS: 40.0000 mg | ORAL_TABLET | Freq: Every day | ORAL | 11 refills | Status: DC

## 2015-11-20 MED ORDER — PREGABALIN 150 MG PO CAPS: ORAL_CAPSULE | ORAL | 2 refills | Status: DC

## 2015-11-27 ENCOUNTER — Ambulatory Visit (INDEPENDENT_AMBULATORY_CARE_PROVIDER_SITE_OTHER): Payer: Medicare Other | Admitting: Family Medicine

## 2015-11-27 ENCOUNTER — Encounter: Payer: Self-pay | Admitting: Family Medicine

## 2015-11-27 VITALS — BP 180/82 | HR 89 | Temp 98.5°F | Ht 61.0 in | Wt 225.6 lb

## 2015-11-27 DIAGNOSIS — I10 Essential (primary) hypertension: Secondary | ICD-10-CM

## 2015-11-27 DIAGNOSIS — E1143 Type 2 diabetes mellitus with diabetic autonomic (poly)neuropathy: Secondary | ICD-10-CM

## 2015-11-27 DIAGNOSIS — G894 Chronic pain syndrome: Secondary | ICD-10-CM | POA: Diagnosis not present

## 2015-11-27 DIAGNOSIS — M5442 Lumbago with sciatica, left side: Secondary | ICD-10-CM | POA: Diagnosis not present

## 2015-11-27 DIAGNOSIS — K625 Hemorrhage of anus and rectum: Secondary | ICD-10-CM

## 2015-11-27 DIAGNOSIS — R1032 Left lower quadrant pain: Secondary | ICD-10-CM

## 2015-11-27 DIAGNOSIS — B372 Candidiasis of skin and nail: Secondary | ICD-10-CM

## 2015-11-27 DIAGNOSIS — K3184 Gastroparesis: Secondary | ICD-10-CM

## 2015-11-27 LAB — POCT HEMOGLOBIN: Hemoglobin: 13 g/dL (ref 12.2–16.2)

## 2015-11-27 MED ORDER — METOCLOPRAMIDE HCL 10 MG PO TABS: 10.0000 mg | ORAL_TABLET | Freq: Three times a day (TID) | ORAL | 3 refills | Status: DC

## 2015-11-27 MED ORDER — ERYTHROMYCIN BASE 250 MG PO TABS: 250.0000 mg | ORAL_TABLET | Freq: Three times a day (TID) | ORAL | 3 refills | Status: DC

## 2015-11-27 MED ORDER — NYSTATIN 100000 UNIT/GM EX POWD: Freq: Three times a day (TID) | CUTANEOUS | 3 refills | Status: DC

## 2015-11-27 MED ORDER — NYSTATIN 100000 UNIT/GM EX CREA: 1.0000 "application " | TOPICAL_CREAM | Freq: Two times a day (BID) | CUTANEOUS | 0 refills | Status: DC

## 2015-11-27 MED ORDER — OXYCODONE HCL 5 MG PO TABS: 5.0000 mg | ORAL_TABLET | Freq: Four times a day (QID) | ORAL | 0 refills | Status: DC | PRN

## 2015-11-27 NOTE — Patient Instructions (Addendum)
Nice to see you today.  For your stomach: - sent in refills of your metoclopramide and erythromycin - referring to stomach doctor - please call us or come back in if you have any further bleeding - checking hemoglobin (anemia level)  For your back pain: - small quantity of oxycodone - see handout on back exercises - referring again to pain clinic  For yeast infection of skin: - sent in powder and cream you can use - keep the area clean and dry  Follow up in 3-4 weeks with me or another provider here.  Be well, Dr. Ardelia Mems

## 2015-11-27 NOTE — Progress Notes (Signed)
Date of Visit: 11/27/2015   HPI:  Patient presents to discuss abdominal pain, and pain in back. She was previously a patient of Dr. Parks Ranger, and has been reassigned to Dr. Brita Romp who is about to be out on maternity leave. Has not yet met new PCP.  Abdominal pain - for 2-3 weeks has had pain in lower abdomen. Located both on her skin (hx of candidal intertrigo) and deeper in her abdomen. Has history of hysterectomy. Reports history of diabetic gastroparesis, for which she takes erythromycin and metoclopramide. Has been out of reglan for a few weeks. Eating and drinking well. Stooling and urinating normally. No fevers. No unintentional weight loss. Endorses one episode of blood in her stool one month ago. Had blood and dark stool then, none since then. Reports chronic history of night sweats for a long time. No shortness of breath. Has some decreased energy.   Back pain - also flaring for 2-3 weeks. Located in middle of back, radiates down L leg. Has tried taking aleve, tylenol arthritis medicine, heating pad, all without relief. In past has taken oxycodone, which made pain more bearable. Would like refill of this today. Also reports she would like to be referred to a pain clinic, had previously been referred but could not afford $50 copay at that time. Does not want to pursue MRI or back surgery at this time, just wants pain relief.   Blood pressure - noted elevated, did not take medications yet today. No acute symptoms with elevated blood pressure, patient feels normal.   ROS: See HPI.  [place]: history of type 2 diabetes, hypertension, allergic rhinitis, chronic pain, gastroparesis, recurrent pancreatitis, CAD, fibromyalgia, hyperlipidemia, diastolic CHF, depression/anxiety, GERD  PHYSICAL EXAM: BP (!) 180/82   Pulse 89   Temp 98.5 F (36.9 C) (Oral)   Ht 5' 1"  (1.549 m)   Wt 225 lb 9.6 oz (102.3 kg)   BMI 42.63 kg/m  Gen: NAD, pleasant, cooperative, well appaering HEENT:  normocephalic, atraumatic, moist mucous membranes  Heart: regular rate and rhythm, no murmur Lungs: clear to auscultation bilaterally, normal work of breathing  Neuro: alert, grossly nonfocal, speech normal Abdomen: soft, nontender to palpation, no masses or organomegaly Skin: +intertriginous candidiasis beneath large abdominal panus.  Ext: No appreciable lower extremity edema bilaterally  Back: nontender to palpation. Full strength bilateral lower extremities. Sensation intact over bilateral lower extremities.  ASSESSMENT/PLAN:  Abdominal pain Appears to have some degree of chronic abdominal discomfort.  Exam benign today. Will refill reglan & erythromycin and see if this provides relief. For episode of rectal bleeding one month ago, hemoglobin checked today & was >13.  Reviewed last colonoscopy - was ~4 years ago with one adenomatous polyp resected, plan was for follow up colonoscopy in 5 years. Given episode of bleeding and current abdominal discomfort, will go ahead and re-refer. Appears prior PCP had planned for this earlier this year (referred back to GI January 2017, seems that patient never went to GI though) Follow up with me in 3 weeks (PCP almost out on maternity leave)  Left-sided low back pain with left-sided sciatica No red flags. Benign exam. Discussed option of pursuing MRI imaging and/or referral to back surgeon. Patient prefers to start with pain relief. Requesting oxycodone. She has allergies to tramadol, has failed tylenol, and is unable to take NSAIDs due to comorbidities. [place] Controlled Substance Database reviewed, findings are appropriate.  - Will refill SMALL quantity of oxycodone (#20) for as needed use. Discussed will not be long term  medication.  - given handout on low back exerciss  Candidal intertrigo rx nystatin cream and powder, encouraged to keep area dry.  HYPERTENSION, BENIGN ESSENTIAL BP elevated though did not yet take medications today. Asymptomatic.  Encouraged compliance with medications. Recheck at follow up in a few weeks.    Note - patient also inquired about refill of seroquel for history of depression. Has been out for months. Brought this up at end of visit, when time did not allow further discussion. Denies SI/HI. Advised we will need to discuss this more at follow up visit, as also want to avoid hypersedation with rx of oxycodone and refill of seroquel. Will hold on seroquel refill at this time & ensure close follow up.   FOLLOW UP: Follow up in 3 weeks for above issues & discuss depression. Referring to GI Referring to pain management  [name] [name], [place]

## 2015-11-28 NOTE — Assessment & Plan Note (Signed)
No red flags. Benign exam. Discussed option of pursuing MRI imaging and/or referral to back surgeon. Patient prefers to start with pain relief. Requesting oxycodone. She has allergies to tramadol, has failed tylenol, and is unable to take NSAIDs due to comorbidities. [place] Controlled Substance Database reviewed, findings are appropriate.  - Will refill SMALL quantity of oxycodone (#20) for as needed use. Discussed will not be long term medication.  - given handout on low back exerciss

## 2015-11-28 NOTE — Assessment & Plan Note (Signed)
BP elevated though did not yet take medications today. Asymptomatic. Encouraged compliance with medications. Recheck at follow up in a few weeks.

## 2015-11-28 NOTE — Assessment & Plan Note (Addendum)
Appears to have some degree of chronic abdominal discomfort.  Exam benign today. Will refill reglan & erythromycin and see if this provides relief. For episode of rectal bleeding one month ago, hemoglobin checked today & was >13.  Reviewed last colonoscopy - was ~4 years ago with one adenomatous polyp resected, plan was for follow up colonoscopy in 5 years. Given episode of bleeding and current abdominal discomfort, will go ahead and re-refer. Appears prior PCP had planned for this earlier this year (referred back to GI January 2017, seems that patient never went to GI though) Follow up with me in 3 weeks (PCP almost out on maternity leave)

## 2015-11-28 NOTE — Assessment & Plan Note (Signed)
rx nystatin cream and powder, encouraged to keep area dry.

## 2015-12-17 ENCOUNTER — Other Ambulatory Visit: Payer: Self-pay | Admitting: Family Medicine

## 2015-12-17 DIAGNOSIS — M545 Low back pain: Secondary | ICD-10-CM

## 2015-12-17 NOTE — Progress Notes (Signed)
Was notified that per Dr. Vira Blanco at pain clinic, patient needs MRI first before seeing pain management. Order entered.  Leeanne Rio, MD

## 2015-12-20 ENCOUNTER — Telehealth: Payer: Self-pay | Admitting: *Deleted

## 2015-12-20 MED ORDER — DIAZEPAM 5 MG PO TABS: 5.0000 mg | ORAL_TABLET | Freq: Once | ORAL | 0 refills | Status: DC | PRN

## 2015-12-20 NOTE — Telephone Encounter (Signed)
Will place Rx for valium (one tablet) at front desk for her to pick up Please inform her it is ready Someone will need to be with her to help her to & from the appointment since she is taking this sedating medication.  Thanks Leeanne Rio, MD

## 2015-12-20 NOTE — Telephone Encounter (Signed)
Pt calling in needing a rx for valium before her MRI. Deseree Kennon Holter, CMA

## 2015-12-21 NOTE — Telephone Encounter (Signed)
Attempted to call pt multiple times but it says not able to receive calls at this time. Deseree Kennon Holter, CMA

## 2015-12-28 ENCOUNTER — Ambulatory Visit
Admission: RE | Admit: 2015-12-28 | Discharge: 2015-12-28 | Disposition: A | Discharge status: Home / Self Care | Payer: Medicare Other | Source: Ambulatory Visit | Attending: Family Medicine | Admitting: Family Medicine

## 2015-12-28 DIAGNOSIS — M545 Low back pain: Secondary | ICD-10-CM

## 2015-12-31 ENCOUNTER — Encounter: Payer: Self-pay | Admitting: Family Medicine

## 2015-12-31 ENCOUNTER — Ambulatory Visit (INDEPENDENT_AMBULATORY_CARE_PROVIDER_SITE_OTHER): Payer: Medicare Other | Admitting: Family Medicine

## 2015-12-31 VITALS — BP 172/80 | HR 82 | Temp 99.3°F | Ht 61.0 in | Wt 228.8 lb

## 2015-12-31 DIAGNOSIS — IMO0002 Reserved for concepts with insufficient information to code with codable children: Secondary | ICD-10-CM

## 2015-12-31 DIAGNOSIS — K861 Other chronic pancreatitis: Secondary | ICD-10-CM

## 2015-12-31 DIAGNOSIS — E1100 Type 2 diabetes mellitus with hyperosmolarity without nonketotic hyperglycemic-hyperosmolar coma (NKHHC): Secondary | ICD-10-CM | POA: Diagnosis not present

## 2015-12-31 DIAGNOSIS — M5442 Lumbago with sciatica, left side: Secondary | ICD-10-CM | POA: Diagnosis not present

## 2015-12-31 DIAGNOSIS — R1013 Epigastric pain: Secondary | ICD-10-CM

## 2015-12-31 DIAGNOSIS — G894 Chronic pain syndrome: Secondary | ICD-10-CM | POA: Diagnosis not present

## 2015-12-31 DIAGNOSIS — K859 Acute pancreatitis without necrosis or infection, unspecified: Secondary | ICD-10-CM

## 2015-12-31 LAB — CBC WITH DIFFERENTIAL/PLATELET
Basophils Absolute: 67 cells/uL (ref 0–200)
Basophils Relative: 1 %
Eosinophils Absolute: 67 cells/uL (ref 15–500)
Eosinophils Relative: 1 %
HCT: 42.1 % (ref 35.0–45.0)
Hemoglobin: 13.6 g/dL (ref 11.7–15.5)
Lymphocytes Relative: 38 %
Lymphs Abs: 2546 cells/uL (ref 850–3900)
MCH: 27.4 pg (ref 27.0–33.0)
MCHC: 32.3 g/dL (ref 32.0–36.0)
MCV: 84.9 fL (ref 80.0–100.0)
MPV: 11.5 fL (ref 7.5–12.5)
Monocytes Absolute: 469 cells/uL (ref 200–950)
Monocytes Relative: 7 %
Neutro Abs: 3551 cells/uL (ref 1500–7800)
Neutrophils Relative %: 53 %
Platelets: 169 10*3/uL (ref 140–400)
RBC: 4.96 MIL/uL (ref 3.80–5.10)
RDW: 13.9 % (ref 11.0–15.0)
WBC: 6.7 10*3/uL (ref 3.8–10.8)

## 2015-12-31 LAB — COMPLETE METABOLIC PANEL WITH GFR
ALT: 15 U/L (ref 6–29)
AST: 16 U/L (ref 10–35)
Albumin: 4 g/dL (ref 3.6–5.1)
Alkaline Phosphatase: 62 U/L (ref 33–130)
BUN: 7 mg/dL (ref 7–25)
CO2: 30 mmol/L (ref 20–31)
Calcium: 9 mg/dL (ref 8.6–10.4)
Chloride: 103 mmol/L (ref 98–110)
Creat: 0.78 mg/dL (ref 0.50–0.99)
GFR, Est African American: >89 mL/min (ref >=60)
GFR, Est Non African American: 79 mL/min (ref >=60)
Glucose, Bld: 167 mg/dL — ABNORMAL HIGH (ref 65–99)
Potassium: 3.7 mmol/L (ref 3.5–5.3)
Sodium: 140 mmol/L (ref 135–146)
Total Bilirubin: 0.4 mg/dL (ref 0.2–1.2)
Total Protein: 7.1 g/dL (ref 6.1–8.1)

## 2015-12-31 LAB — LIPASE: Lipase: 145 U/L — ABNORMAL HIGH (ref 7–60)

## 2015-12-31 LAB — POCT GLYCOSYLATED HEMOGLOBIN (HGB A1C): Hemoglobin A1C: 9.1

## 2015-12-31 MED ORDER — OXYCODONE HCL 5 MG PO TABS: 5.0000 mg | ORAL_TABLET | Freq: Four times a day (QID) | ORAL | 0 refills | Status: DC | PRN

## 2015-12-31 NOTE — Patient Instructions (Addendum)
Checking labs today Small quantity of oxycodone today for your pain since we think this is a flareup of pancreatitis Drink clear liquids Follow up in 1 week, sooner if you are not getting better  If you cannot drink liquids, have a fever 100.4 or higher, or your pain is not controlled with the oxycodone please go to the ER to be evaluated immediately  Call the GI doctor to schedule an appointment: [phone]  We will check on the status of the pain center referral  Completing [facility] bus forms today.  Be well, Dr. Ardelia Mems     Acute Pancreatitis Acute pancreatitis is a disease in which the pancreas becomes suddenly inflamed. The pancreas is a large gland located behind your stomach. The pancreas produces enzymes that help digest food. The pancreas also releases the hormones glucagon and insulin that help regulate blood sugar. Damage to the pancreas occurs when the digestive enzymes from the pancreas are activated and begin attacking the pancreas before being released into the intestine. Most acute attacks last a couple of days and can cause serious complications. Some people become dehydrated and develop low blood pressure. In severe cases, bleeding into the pancreas can lead to shock and can be life-threatening. The lungs, heart, and kidneys may fail. CAUSES  Pancreatitis can happen to anyone. In some cases, the cause is unknown. Most cases are caused by:  Alcohol abuse.  Gallstones. Other less common causes are:  Certain medicines.  Exposure to certain chemicals.  Infection.  Damage caused by an accident (trauma).  Abdominal surgery. SYMPTOMS   Pain in the upper abdomen that may radiate to the back.  Tenderness and swelling of the abdomen.  Nausea and vomiting. DIAGNOSIS  Your caregiver will perform a physical exam. Blood and stool tests may be done to confirm the diagnosis. Imaging tests may also be done, such as X-rays, CT scans, or an ultrasound of the  abdomen. TREATMENT  Treatment usually requires a stay in the hospital. Treatment may include:  Pain medicine.  Fluid replacement through an intravenous line (IV).  Placing a tube in the stomach to remove stomach contents and control vomiting.  Not eating for 3 or 4 days. This gives your pancreas a rest, because enzymes are not being produced that can cause further damage.  Antibiotic medicines if your condition is caused by an infection.  Surgery of the pancreas or gallbladder. HOME CARE INSTRUCTIONS   Follow the diet advised by your caregiver. This may involve avoiding alcohol and decreasing the amount of fat in your diet.  Eat smaller, more frequent meals. This reduces the amount of digestive juices the pancreas produces.  Drink enough fluids to keep your urine clear or pale yellow.  Only take over-the-counter or prescription medicines as directed by your caregiver.  Avoid drinking alcohol if it caused your condition.  Do not smoke.  Get plenty of rest.  Check your blood sugar at home as directed by your caregiver.  Keep all follow-up appointments as directed by your caregiver. SEEK MEDICAL CARE IF:   You do not recover as quickly as expected.  You develop new or worsening symptoms.  You have persistent pain, weakness, or nausea.  You recover and then have another episode of pain. SEEK IMMEDIATE MEDICAL CARE IF:   You are unable to eat or keep fluids down.  Your pain becomes severe.  You have a fever or persistent symptoms for more than 2 to 3 days.  You have a fever and your symptoms  suddenly get worse.  Your skin or the white part of your eyes turn yellow (jaundice).  You develop vomiting.  You feel dizzy, or you faint.  Your blood sugar is high (over 300 mg/dL). MAKE SURE YOU:   Understand these instructions.  Will watch your condition.  Will get help right away if you are not doing well or get worse.   This information is not intended to  replace advice given to you by your health care provider. Make sure you discuss any questions you have with your health care provider.   Document Released: 04/07/2005 Document Revised: 10/07/2011 Document Reviewed: 07/17/2011 Elsevier Interactive Patient Education [employer].

## 2015-12-31 NOTE — Progress Notes (Signed)
1117 21   

## 2015-12-31 NOTE — Progress Notes (Signed)
Date of Visit: 12/31/2015   HPI:  Patient presents for follow up.  Back pain - Had MRI of back done. Wants to review results. Has not heard yet about pain clinic.  Abdominal pain - since last week has had pain in epigastric area radiating to her back. Has history of recurrent pancreatitis. Has nausea, no vomiting. No fevers (has not checked temperature). No blood in stool. Stools are yellow. At last visit we added back reglan and erythromycin, which helped some, but not all the way. Pain has since flared.  ROS: See HPI.  [place]: history of type 2 diabetes, hypertension, allergic rhinitis, chronic pain, gastroparesis, recurrent pancreatitis, CAD, fibromyalgia, hyperlipidemia, diastolic CHF, depression/anxiety, GERD  PHYSICAL EXAM: BP (!) 172/70   Pulse 82   Temp 99.3 F (37.4 C) (Oral)   Ht 5\' 1"  (1.549 m)   Wt 228 lb 12.8 oz (103.8 kg)   BMI 43.23 kg/m  Gen: NAD, pleasant, cooperative HEENT: normocephalic, atraumatic, moist mucous membranes  Heart: regular rate and rhythm, no murmur Lungs: clear to auscultation bilaterally, normal work of breathing  Neuro: alert grossly nonfocal, speech normal Abdomen: mild tenderness to palpation in epigastric area. No rebound or guarding. No peritoneal signs. Normoactive bowel sounds.  Ext: No appreciable lower extremity edema bilaterally   ASSESSMENT/PLAN:  Abdominal pain Flare up of pain currently in epigastric area, radiating to back. With history of recurrent pancreatitis, suspicious for another episode of pancreatitis. Discussed options for management with patient, including admission to the hospital for IV fluids and bowel rest, versus trial of management at home. She strongly prefers to avoid hospitalization if possible. No signs of acute abdomen on exam. Well hydrated presently. Plan: - labs: CMET, CBC, lipase - sips of clear liquids to stay hydrated - rx oxycodone #20 tablets for pain relief (NOT long term, reviewed [place] CSRS with no red  flags) - follow up in 1 week, sooner if not improving - if any worsening, fevers, inability to stay hydrated, etc patient to go to ER, discussed at length with patient who stated her understanding - also advised she call GI office to schedule appointment, as they have tried to reach her and were unsuccessful.  Left-sided low back pain with left-sided sciatica Discussed MRI findings with patient - show multi level degenerative changes. Discussed option of referral to neurosurgery to see if any surgical options are possible, but patient wants to avoid surgery Will follow up on pain management referral (pt has not heard about this appointment yet)  Note - also completed forms today for [facility] bus. Patient cannot ambulate long distances due to back pain, also has trouble with stairs.  FOLLOW UP: Follow up in 1 week for above issues  [place] J. Ardelia Mems, [place]

## 2016-01-02 NOTE — Assessment & Plan Note (Addendum)
Flare up of pain currently in epigastric area, radiating to back. With history of recurrent pancreatitis, suspicious for another episode of pancreatitis. Discussed options for management with patient, including admission to the hospital for IV fluids and bowel rest, versus trial of management at home. She strongly prefers to avoid hospitalization if possible. No signs of acute abdomen on exam. Well hydrated presently. Plan: - labs: CMET, CBC, lipase - sips of clear liquids to stay hydrated - rx oxycodone #20 tablets for pain relief (NOT long term, reviewed [place] CSRS with no red flags) - follow up in 1 week, sooner if not improving - if any worsening, fevers, inability to stay hydrated, etc patient to go to ER, discussed at length with patient who stated her understanding - also advised she call GI office to schedule appointment, as they have tried to reach her and were unsuccessful.

## 2016-01-02 NOTE — Assessment & Plan Note (Signed)
Discussed MRI findings with patient - show multi level degenerative changes. Discussed option of referral to neurosurgery to see if any surgical options are possible, but patient wants to avoid surgery Will follow up on pain management referral (pt has not heard about this appointment yet)

## 2016-01-04 ENCOUNTER — Encounter: Payer: Self-pay | Admitting: Family Medicine

## 2016-01-07 ENCOUNTER — Encounter (HOSPITAL_COMMUNITY): Payer: Self-pay

## 2016-01-07 ENCOUNTER — Other Ambulatory Visit: Payer: Self-pay

## 2016-01-07 ENCOUNTER — Emergency Department (HOSPITAL_COMMUNITY)
Admission: EM | Admit: 2016-01-07 | Discharge: 2016-01-08 | Disposition: A | Discharge status: Home / Self Care | Payer: Medicare Other | Attending: Emergency Medicine | Admitting: Emergency Medicine

## 2016-01-07 DIAGNOSIS — M5442 Lumbago with sciatica, left side: Secondary | ICD-10-CM

## 2016-01-07 DIAGNOSIS — Z8673 Personal history of transient ischemic attack (TIA), and cerebral infarction without residual deficits: Secondary | ICD-10-CM | POA: Diagnosis not present

## 2016-01-07 DIAGNOSIS — K861 Other chronic pancreatitis: Secondary | ICD-10-CM

## 2016-01-07 DIAGNOSIS — I110 Hypertensive heart disease with heart failure: Secondary | ICD-10-CM | POA: Diagnosis not present

## 2016-01-07 DIAGNOSIS — Z86018 Personal history of other benign neoplasm: Secondary | ICD-10-CM | POA: Diagnosis not present

## 2016-01-07 DIAGNOSIS — Z79899 Other long term (current) drug therapy: Secondary | ICD-10-CM | POA: Insufficient documentation

## 2016-01-07 DIAGNOSIS — Z7982 Long term (current) use of aspirin: Secondary | ICD-10-CM | POA: Insufficient documentation

## 2016-01-07 DIAGNOSIS — I2510 Atherosclerotic heart disease of native coronary artery without angina pectoris: Secondary | ICD-10-CM | POA: Diagnosis not present

## 2016-01-07 DIAGNOSIS — Z91040 Latex allergy status: Secondary | ICD-10-CM | POA: Insufficient documentation

## 2016-01-07 DIAGNOSIS — Z794 Long term (current) use of insulin: Secondary | ICD-10-CM | POA: Insufficient documentation

## 2016-01-07 DIAGNOSIS — E1143 Type 2 diabetes mellitus with diabetic autonomic (poly)neuropathy: Secondary | ICD-10-CM | POA: Insufficient documentation

## 2016-01-07 DIAGNOSIS — G894 Chronic pain syndrome: Secondary | ICD-10-CM

## 2016-01-07 DIAGNOSIS — F17210 Nicotine dependence, cigarettes, uncomplicated: Secondary | ICD-10-CM | POA: Diagnosis not present

## 2016-01-07 DIAGNOSIS — I5032 Chronic diastolic (congestive) heart failure: Secondary | ICD-10-CM | POA: Insufficient documentation

## 2016-01-07 DIAGNOSIS — E1140 Type 2 diabetes mellitus with diabetic neuropathy, unspecified: Secondary | ICD-10-CM | POA: Diagnosis not present

## 2016-01-07 DIAGNOSIS — K859 Acute pancreatitis without necrosis or infection, unspecified: Secondary | ICD-10-CM | POA: Diagnosis present

## 2016-01-07 LAB — COMPREHENSIVE METABOLIC PANEL
ALT: 21 U/L (ref 14–54)
AST: 21 U/L (ref 15–41)
Albumin: 4.2 g/dL (ref 3.5–5.0)
Alkaline Phosphatase: 83 U/L (ref 38–126)
Anion gap: 9 (ref 5–15)
BUN: 7 mg/dL (ref 6–20)
CO2: 26 mmol/L (ref 22–32)
Calcium: 9.5 mg/dL (ref 8.9–10.3)
Chloride: 104 mmol/L (ref 101–111)
Creatinine, Ser: 0.76 mg/dL (ref 0.44–1.00)
GFR calc Af Amer: >60 mL/min (ref >60)
GFR calc non Af Amer: >60 mL/min (ref >60)
Glucose, Bld: 194 mg/dL — ABNORMAL HIGH (ref 65–99)
Potassium: 3.7 mmol/L (ref 3.5–5.1)
Sodium: 139 mmol/L (ref 135–145)
Total Bilirubin: 0.6 mg/dL (ref 0.3–1.2)
Total Protein: 8.1 g/dL (ref 6.5–8.1)

## 2016-01-07 LAB — CBC
HCT: 45.9 % (ref 36.0–46.0)
Hemoglobin: 14.7 g/dL (ref 12.0–15.0)
MCH: 27.9 pg (ref 26.0–34.0)
MCHC: 32 g/dL (ref 30.0–36.0)
MCV: 87.3 fL (ref 78.0–100.0)
Platelets: 177 10*3/uL (ref 150–400)
RBC: 5.26 MIL/uL — ABNORMAL HIGH (ref 3.87–5.11)
RDW: 13 % (ref 11.5–15.5)
WBC: 6.5 10*3/uL (ref 4.0–10.5)

## 2016-01-07 LAB — LIPASE, BLOOD: Lipase: 73 U/L — ABNORMAL HIGH (ref 11–51)

## 2016-01-07 MED ORDER — ONDANSETRON 4 MG PO TBDP
ORAL_TABLET | ORAL | Status: AC
  Filled 2016-01-07: qty 1

## 2016-01-07 MED ORDER — ONDANSETRON 4 MG PO TBDP
4.0000 mg | ORAL_TABLET | Freq: Once | ORAL | Status: AC | PRN
  Administered 2016-01-07: 4 mg via ORAL

## 2016-01-07 MED ORDER — SODIUM CHLORIDE 0.9 % IV BOLUS (SEPSIS)
1000.0000 mL | Freq: Once | INTRAVENOUS | Status: AC
Start: 2016-01-07 — End: 2016-01-08
  Administered 2016-01-07: 1000 mL via INTRAVENOUS

## 2016-01-07 MED ORDER — HYDROMORPHONE HCL 1 MG/ML IJ SOLN
1.0000 mg | Freq: Once | INTRAMUSCULAR | Status: AC
  Administered 2016-01-07: 1 mg via INTRAVENOUS
  Filled 2016-01-07: qty 1

## 2016-01-07 MED ORDER — ONDANSETRON HCL 4 MG/2ML IJ SOLN
4.0000 mg | Freq: Once | INTRAMUSCULAR | Status: AC
  Administered 2016-01-07: 4 mg via INTRAVENOUS
  Filled 2016-01-07: qty 2

## 2016-01-07 NOTE — ED Provider Notes (Signed)
The patient is a 65 year old female, she has a history of chronic pancreatitis, she is currently being treated by her family doctor with oxycodone for the last 3 weeks because of an epigastric discomfort. She reports that she is still able to eat and drink though sometimes this makes it worse. She denies any bowel abnormalities, on exam the patient has minimal epigastric discomfort with tenderness to palpation, otherwise her abdomen is obese but soft, no guarding, no peritoneal signs, no masses. There is no tachycardia, no difficulty breathing, lab work was reviewed and showed no signs of significant elevation of the lipase. She will get a single dose of intravenous medication with some IV fluids and be discharged home to follow-up in the outpatient setting. I do not think that imaging is indicated at this time as the patient has had a CT scan in January of this year showing no signs of acute pancreatitis   EKG Interpretation  Date/Time:  Monday January 07 2016 23:19:44 EDT Ventricular Rate:  80 PR Interval:    QRS Duration: 82 QT Interval:  404 QTC Calculation: 466 R Axis:   35 Text Interpretation:  Sinus rhythm Probable anteroseptal infarct, recent Lateral leads are also involved Abnormal ekg since last tracing no significant change from 05/12/15 Confirmed by Ankith Edmonston  MD, [place] ([id number]) on 01/07/2016 11:39:06 PM      No change on this ECG compared with last from January  Medical screening examination/treatment/procedure(s) were conducted as a shared visit with non-physician practitioner(s) and myself.  I personally evaluated the patient during the encounter.  Clinical Impression:   Final diagnoses:  None         Noemi Chapel, MD 01/08/16 [phone]

## 2016-01-07 NOTE — ED Triage Notes (Signed)
Pt is seen for pancreatitis comes in for pain management

## 2016-01-07 NOTE — ED Notes (Signed)
Gave pt ice water, per Randall Hiss - RN.
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
## 2016-01-08 ENCOUNTER — Ambulatory Visit: Payer: Medicare Other | Admitting: Family Medicine

## 2016-01-08 MED ORDER — OXYCODONE HCL 5 MG PO TABS: 5.0000 mg | ORAL_TABLET | Freq: Four times a day (QID) | ORAL | 0 refills | Status: DC | PRN

## 2016-01-14 ENCOUNTER — Ambulatory Visit (INDEPENDENT_AMBULATORY_CARE_PROVIDER_SITE_OTHER): Payer: Medicare Other | Admitting: Family Medicine

## 2016-01-14 ENCOUNTER — Encounter (HOSPITAL_COMMUNITY): Payer: Self-pay | Admitting: Surgery

## 2016-01-14 ENCOUNTER — Inpatient Hospital Stay (HOSPITAL_COMMUNITY): Payer: Medicare Other

## 2016-01-14 ENCOUNTER — Encounter: Payer: Self-pay | Admitting: Family Medicine

## 2016-01-14 ENCOUNTER — Ambulatory Visit (HOSPITAL_COMMUNITY)
Admission: RE | Admit: 2016-01-14 | Discharge: 2016-01-14 | Disposition: A | Discharge status: Home / Self Care | Payer: Medicare Other | Source: Ambulatory Visit | Attending: Family Medicine | Admitting: Family Medicine

## 2016-01-14 ENCOUNTER — Inpatient Hospital Stay (HOSPITAL_COMMUNITY)
Admission: AD | Admit: 2016-01-14 | Discharge: 2016-01-18 | DRG: 641 | Disposition: A | Discharge status: Home / Self Care | Payer: Medicare Other | Source: Ambulatory Visit | Attending: Family Medicine | Admitting: Family Medicine

## 2016-01-14 VITALS — BP 206/81 | HR 79 | Temp 98.8°F | Ht 63.0 in | Wt 219.0 lb

## 2016-01-14 DIAGNOSIS — I110 Hypertensive heart disease with heart failure: Secondary | ICD-10-CM | POA: Diagnosis present

## 2016-01-14 DIAGNOSIS — F329 Major depressive disorder, single episode, unspecified: Secondary | ICD-10-CM | POA: Diagnosis present

## 2016-01-14 DIAGNOSIS — Z823 Family history of stroke: Secondary | ICD-10-CM | POA: Diagnosis not present

## 2016-01-14 DIAGNOSIS — K861 Other chronic pancreatitis: Secondary | ICD-10-CM

## 2016-01-14 DIAGNOSIS — Z8249 Family history of ischemic heart disease and other diseases of the circulatory system: Secondary | ICD-10-CM

## 2016-01-14 DIAGNOSIS — Z961 Presence of intraocular lens: Secondary | ICD-10-CM

## 2016-01-14 DIAGNOSIS — I700 Atherosclerosis of aorta: Secondary | ICD-10-CM | POA: Diagnosis present

## 2016-01-14 DIAGNOSIS — Z833 Family history of diabetes mellitus: Secondary | ICD-10-CM | POA: Diagnosis not present

## 2016-01-14 DIAGNOSIS — B3781 Candidal esophagitis: Secondary | ICD-10-CM

## 2016-01-14 DIAGNOSIS — M797 Fibromyalgia: Secondary | ICD-10-CM | POA: Diagnosis present

## 2016-01-14 DIAGNOSIS — E1143 Type 2 diabetes mellitus with diabetic autonomic (poly)neuropathy: Secondary | ICD-10-CM | POA: Diagnosis not present

## 2016-01-14 DIAGNOSIS — Z7982 Long term (current) use of aspirin: Secondary | ICD-10-CM

## 2016-01-14 DIAGNOSIS — E860 Dehydration: Secondary | ICD-10-CM | POA: Diagnosis not present

## 2016-01-14 DIAGNOSIS — K859 Acute pancreatitis without necrosis or infection, unspecified: Secondary | ICD-10-CM

## 2016-01-14 DIAGNOSIS — E1142 Type 2 diabetes mellitus with diabetic polyneuropathy: Secondary | ICD-10-CM | POA: Diagnosis not present

## 2016-01-14 DIAGNOSIS — K2980 Duodenitis without bleeding: Secondary | ICD-10-CM | POA: Diagnosis present

## 2016-01-14 DIAGNOSIS — K219 Gastro-esophageal reflux disease without esophagitis: Secondary | ICD-10-CM | POA: Diagnosis present

## 2016-01-14 DIAGNOSIS — Z794 Long term (current) use of insulin: Secondary | ICD-10-CM

## 2016-01-14 DIAGNOSIS — I10 Essential (primary) hypertension: Secondary | ICD-10-CM | POA: Diagnosis not present

## 2016-01-14 DIAGNOSIS — Z800 Family history of malignant neoplasm of digestive organs: Secondary | ICD-10-CM

## 2016-01-14 DIAGNOSIS — K5900 Constipation, unspecified: Secondary | ICD-10-CM | POA: Diagnosis present

## 2016-01-14 DIAGNOSIS — F411 Generalized anxiety disorder: Secondary | ICD-10-CM | POA: Diagnosis present

## 2016-01-14 DIAGNOSIS — I2510 Atherosclerotic heart disease of native coronary artery without angina pectoris: Secondary | ICD-10-CM | POA: Diagnosis not present

## 2016-01-14 DIAGNOSIS — E1159 Type 2 diabetes mellitus with other circulatory complications: Secondary | ICD-10-CM

## 2016-01-14 DIAGNOSIS — K838 Other specified diseases of biliary tract: Secondary | ICD-10-CM

## 2016-01-14 DIAGNOSIS — J45909 Unspecified asthma, uncomplicated: Secondary | ICD-10-CM | POA: Diagnosis present

## 2016-01-14 DIAGNOSIS — E785 Hyperlipidemia, unspecified: Secondary | ICD-10-CM | POA: Diagnosis present

## 2016-01-14 DIAGNOSIS — F17210 Nicotine dependence, cigarettes, uncomplicated: Secondary | ICD-10-CM | POA: Diagnosis present

## 2016-01-14 DIAGNOSIS — K2950 Unspecified chronic gastritis without bleeding: Secondary | ICD-10-CM | POA: Diagnosis present

## 2016-01-14 DIAGNOSIS — I5030 Unspecified diastolic (congestive) heart failure: Secondary | ICD-10-CM | POA: Diagnosis not present

## 2016-01-14 DIAGNOSIS — R079 Chest pain, unspecified: Secondary | ICD-10-CM

## 2016-01-14 DIAGNOSIS — G894 Chronic pain syndrome: Secondary | ICD-10-CM | POA: Diagnosis present

## 2016-01-14 DIAGNOSIS — Z7951 Long term (current) use of inhaled steroids: Secondary | ICD-10-CM

## 2016-01-14 DIAGNOSIS — E118 Type 2 diabetes mellitus with unspecified complications: Secondary | ICD-10-CM | POA: Diagnosis not present

## 2016-01-14 DIAGNOSIS — I5032 Chronic diastolic (congestive) heart failure: Secondary | ICD-10-CM | POA: Diagnosis present

## 2016-01-14 DIAGNOSIS — I152 Hypertension secondary to endocrine disorders: Secondary | ICD-10-CM

## 2016-01-14 DIAGNOSIS — R1013 Epigastric pain: Secondary | ICD-10-CM

## 2016-01-14 DIAGNOSIS — K3184 Gastroparesis: Secondary | ICD-10-CM | POA: Diagnosis present

## 2016-01-14 DIAGNOSIS — R109 Unspecified abdominal pain: Secondary | ICD-10-CM

## 2016-01-14 LAB — COMPREHENSIVE METABOLIC PANEL
ALT: 15 U/L (ref 14–54)
AST: 20 U/L (ref 15–41)
Albumin: 4 g/dL (ref 3.5–5.0)
Alkaline Phosphatase: 66 U/L (ref 38–126)
Anion gap: 12 (ref 5–15)
BUN: 5 mg/dL — ABNORMAL LOW (ref 6–20)
CO2: 25 mmol/L (ref 22–32)
Calcium: 9 mg/dL (ref 8.9–10.3)
Chloride: 104 mmol/L (ref 101–111)
Creatinine, Ser: 0.67 mg/dL (ref 0.44–1.00)
GFR calc Af Amer: >60 mL/min (ref >60)
GFR calc non Af Amer: >60 mL/min (ref >60)
Glucose, Bld: 96 mg/dL (ref 65–99)
Potassium: 3.9 mmol/L (ref 3.5–5.1)
Sodium: 141 mmol/L (ref 135–145)
Total Bilirubin: 0.5 mg/dL (ref 0.3–1.2)
Total Protein: 7.6 g/dL (ref 6.5–8.1)

## 2016-01-14 LAB — GLUCOSE, CAPILLARY
Glucose-Capillary: 100 mg/dL — ABNORMAL HIGH (ref 65–99)
Glucose-Capillary: 150 mg/dL — ABNORMAL HIGH (ref 65–99)

## 2016-01-14 LAB — LIPID PANEL
Cholesterol: 139 mg/dL (ref 0–200)
HDL: 37 mg/dL — ABNORMAL LOW (ref >40)
LDL Cholesterol: 65 mg/dL (ref 0–99)
Total CHOL/HDL Ratio: 3.8 RATIO
Triglycerides: 187 mg/dL — ABNORMAL HIGH (ref <150)
VLDL: 37 mg/dL (ref 0–40)

## 2016-01-14 LAB — CBC
HCT: 43.3 % (ref 36.0–46.0)
Hemoglobin: 14.3 g/dL (ref 12.0–15.0)
MCH: 28.3 pg (ref 26.0–34.0)
MCHC: 33 g/dL (ref 30.0–36.0)
MCV: 85.7 fL (ref 78.0–100.0)
Platelets: UNDETERMINED 10*3/uL (ref 150–400)
RBC: 5.05 MIL/uL (ref 3.87–5.11)
RDW: 13.4 % (ref 11.5–15.5)
WBC: 6.7 10*3/uL (ref 4.0–10.5)

## 2016-01-14 LAB — TROPONIN I: Troponin I: 0.04 ng/mL (ref <0.03)

## 2016-01-14 LAB — LIPASE, BLOOD: Lipase: 39 U/L (ref 11–51)

## 2016-01-14 MED ORDER — DIFLUPREDNATE 0.05 % OP EMUL: 2.0000 [drp] | Freq: Two times a day (BID) | OPHTHALMIC | Status: DC

## 2016-01-14 MED ORDER — ALBUTEROL SULFATE (2.5 MG/3ML) 0.083% IN NEBU: 3.0000 mL | INHALATION_SOLUTION | RESPIRATORY_TRACT | Status: DC | PRN

## 2016-01-14 MED ORDER — HYDROMORPHONE HCL 1 MG/ML IJ SOLN
0.5000 mg | INTRAMUSCULAR | Status: DC | PRN
  Administered 2016-01-14 – 2016-01-16 (×8): 0.5 mg via INTRAVENOUS
  Filled 2016-01-14 (×9): qty 1

## 2016-01-14 MED ORDER — FAMOTIDINE IN NACL 20-0.9 MG/50ML-% IV SOLN
20.0000 mg | Freq: Two times a day (BID) | INTRAVENOUS | Status: DC
  Administered 2016-01-14 – 2016-01-15 (×3): 20 mg via INTRAVENOUS
  Filled 2016-01-14 (×5): qty 50

## 2016-01-14 MED ORDER — IOPAMIDOL (ISOVUE-300) INJECTION 61%
INTRAVENOUS | Status: AC
  Administered 2016-01-14: 100 mL
  Filled 2016-01-14: qty 100

## 2016-01-14 MED ORDER — INSULIN ASPART 100 UNIT/ML ~~LOC~~ SOLN
0.0000 [IU] | Freq: Three times a day (TID) | SUBCUTANEOUS | Status: DC
  Administered 2016-01-15 (×3): 2 [IU] via SUBCUTANEOUS
  Administered 2016-01-16: 3 [IU] via SUBCUTANEOUS
  Administered 2016-01-16: 2 [IU] via SUBCUTANEOUS

## 2016-01-14 MED ORDER — HYDROXYZINE HCL 50 MG/ML IM SOLN
25.0000 mg | Freq: Four times a day (QID) | INTRAMUSCULAR | Status: DC | PRN
  Filled 2016-01-14: qty 0.5

## 2016-01-14 MED ORDER — LOTEPREDNOL ETABONATE 0.2 % OP SUSP: 1.0000 [drp] | Freq: Two times a day (BID) | OPHTHALMIC | Status: DC | PRN

## 2016-01-14 MED ORDER — ERYTHROMYCIN BASE 250 MG PO TABS
250.0000 mg | ORAL_TABLET | Freq: Three times a day (TID) | ORAL | Status: DC
  Administered 2016-01-14 – 2016-01-15 (×4): 250 mg via ORAL
  Filled 2016-01-14 (×8): qty 1

## 2016-01-14 MED ORDER — DEXTROSE-NACL 5-0.45 % IV SOLN
INTRAVENOUS | Status: DC
  Administered 2016-01-14 – 2016-01-16 (×6): via INTRAVENOUS

## 2016-01-14 MED ORDER — NICOTINE 21 MG/24HR TD PT24
21.0000 mg | MEDICATED_PATCH | Freq: Every day | TRANSDERMAL | Status: DC
  Administered 2016-01-14 – 2016-01-15 (×2): 21 mg via TRANSDERMAL
  Filled 2016-01-14 (×3): qty 1

## 2016-01-14 MED ORDER — INSULIN GLARGINE 100 UNIT/ML ~~LOC~~ SOLN
20.0000 [IU] | Freq: Every day | SUBCUTANEOUS | Status: DC
  Administered 2016-01-14 – 2016-01-15 (×2): 20 [IU] via SUBCUTANEOUS
  Filled 2016-01-14 (×3): qty 0.2

## 2016-01-14 MED ORDER — PREDNISOLONE ACETATE 1 % OP SUSP
1.0000 [drp] | Freq: Two times a day (BID) | OPHTHALMIC | Status: DC | PRN
  Filled 2016-01-14: qty 1

## 2016-01-14 MED ORDER — HYDROXYZINE HCL 10 MG PO TABS
10.0000 mg | ORAL_TABLET | Freq: Three times a day (TID) | ORAL | Status: DC | PRN
  Administered 2016-01-14 – 2016-01-15 (×3): 10 mg via ORAL
  Filled 2016-01-14 (×4): qty 1

## 2016-01-14 MED ORDER — LOTEPREDNOL ETABONATE 0.5 % OP SUSP
1.0000 [drp] | Freq: Two times a day (BID) | OPHTHALMIC | Status: DC | PRN
  Filled 2016-01-14: qty 5

## 2016-01-14 MED ORDER — LORAZEPAM 2 MG/ML IJ SOLN: 0.5000 mg | Freq: Every evening | INTRAMUSCULAR | Status: DC | PRN

## 2016-01-14 MED ORDER — INSULIN ASPART 100 UNIT/ML ~~LOC~~ SOLN: 0.0000 [IU] | Freq: Every day | SUBCUTANEOUS | Status: DC

## 2016-01-14 MED ORDER — MOMETASONE FURO-FORMOTEROL FUM 200-5 MCG/ACT IN AERO
2.0000 | INHALATION_SPRAY | Freq: Two times a day (BID) | RESPIRATORY_TRACT | Status: DC
  Administered 2016-01-15 (×2): 2 via RESPIRATORY_TRACT
  Filled 2016-01-14: qty 8.8

## 2016-01-14 MED ORDER — IOPAMIDOL (ISOVUE-300) INJECTION 61%
INTRAVENOUS | Status: AC
  Filled 2016-01-14: qty 30

## 2016-01-14 MED ORDER — ENOXAPARIN SODIUM 40 MG/0.4ML ~~LOC~~ SOLN
40.0000 mg | SUBCUTANEOUS | Status: DC
  Administered 2016-01-14 – 2016-01-15 (×2): 40 mg via SUBCUTANEOUS
  Filled 2016-01-14 (×2): qty 0.4

## 2016-01-14 MED ORDER — INFLUENZA VAC SPLIT QUAD 0.5 ML IM SUSY
0.5000 mL | PREFILLED_SYRINGE | INTRAMUSCULAR | Status: DC
Start: 2016-01-15 — End: 2016-01-16
  Filled 2016-01-14: qty 0.5

## 2016-01-14 MED ORDER — NICOTINE 7 MG/24HR TD PT24: 7.0000 mg | MEDICATED_PATCH | Freq: Every day | TRANSDERMAL | Status: DC

## 2016-01-14 MED ORDER — ONDANSETRON HCL 4 MG/2ML IJ SOLN
4.0000 mg | Freq: Four times a day (QID) | INTRAMUSCULAR | Status: DC | PRN
Start: 2016-01-14 — End: 2016-01-16
  Administered 2016-01-14 – 2016-01-15 (×2): 4 mg via INTRAVENOUS
  Filled 2016-01-14 (×2): qty 2

## 2016-01-14 MED ORDER — HYDRALAZINE HCL 20 MG/ML IJ SOLN
10.0000 mg | Freq: Two times a day (BID) | INTRAMUSCULAR | Status: DC
  Administered 2016-01-14 – 2016-01-15 (×3): 10 mg via INTRAVENOUS
  Filled 2016-01-14 (×4): qty 1

## 2016-01-14 NOTE — Progress Notes (Signed)
Date of Visit: 01/14/2016   HPI:  [name] [name] presents for follow up of pancreatitis.   Seen 9/11 by me here at [facility] with complaints of new onset epigastric pain. Lipase elevated, treated as flare of chronic pancreatitis. Instructed to follow up in 1 week, though patient did not attend that follow up appointment. Seen in ED on 9/18 for continued abdominal pain. Given IV pain medication, IV fluids, and discharged home.  Since ED visit, has had continued epigastric abdominal pain radiating to the back. Thinks she's had subjective fever though did not take temp. Had sweats. Not eating and drinking much. Drinking som eliquids. Has nausea and vomited twice over the last week. No blood in stool but has had some liquid yellow stool. When asked about feeling shortness of breath she says "barely". Thinks she needs to be admitted to the hospital.   ROS: See HPI.  [place]: history of hypertension, chronic pancreatitis, type 2 diabetes, allergic rhinitis, tobacco abuse, chronic pain, CAD, dyshidrotic eczema, hyperlipidemia, polypharmacy, diastolic CHF, GERD, constipation  PHYSICAL EXAM: BP (!) 206/81 (BP Location: Left Arm, Patient Position: Sitting, Cuff Size: Large)   Pulse 79   Temp 98.8 F (37.1 C) (Oral)   Ht 5\' 3"  (1.6 m)   Wt 219 lb (99.3 kg)   SpO2 100%   BMI 38.79 kg/m   BP on recheck 180/82 Gen: NAD, pleasant.  HEENT: normocephalic, atraumatic. Mouth slightly dry. No anterior cervical or supraclavicular lymphadenopathy. No oral lesions Heart: regular rate and rhythm, no murmur Lungs: clear to auscultation bilaterally, normal work of breathing, no crackles or wheezes Neuro: alert grossly nonfocal, speech normal Abdomen: mildly tender to palpation in epigastric area. No guarding, rebound tenderness, masses, or peritoneal signs. Bowel sounds slightly hypoactive. Ext: No appreciable lower extremity edema bilaterally   ASSESSMENT/PLAN:  65 yo F with history of  recurrent/chronic pancreatitis presenting with continued abdominal pain, nausea, and poor PO intake in setting of known current pancreatitis flare. Has failed outpatient management. Now agreeable to admission to the hospital. EKG performed today due to complaint of epigastric pain, unchanged from prior. Will admit for IV fluids, bowel rest, CT abdomen. Check troponin to rule out ACS (though low suspicion at present). See separate H&P for further information.  [name] [name]. [name] [name], [name] [name]

## 2016-01-14 NOTE — Progress Notes (Signed)
CRITICAL VALUE ALERT  Critical value received:  Troponin 0.04  Date of notification:  01/14/16  Time of notification:  [id number]  Critical value read back:Yes.    Nurse who received alert:  Joslyn Hy, MSN, RN, CMSRN   MD notified (1st page):  Family Medicine  Time of first page:  82  MD notified (2nd page):  Time of second page:  Responding MD:  Family Medicine  Time MD responded:  [phone]
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
## 2016-01-15 ENCOUNTER — Inpatient Hospital Stay (HOSPITAL_COMMUNITY): Payer: Medicare Other

## 2016-01-15 DIAGNOSIS — I10 Essential (primary) hypertension: Secondary | ICD-10-CM

## 2016-01-15 DIAGNOSIS — R1013 Epigastric pain: Secondary | ICD-10-CM

## 2016-01-15 DIAGNOSIS — E1159 Type 2 diabetes mellitus with other circulatory complications: Secondary | ICD-10-CM

## 2016-01-15 DIAGNOSIS — R079 Chest pain, unspecified: Secondary | ICD-10-CM

## 2016-01-15 LAB — CBC
HCT: 41.6 % (ref 36.0–46.0)
Hemoglobin: 13.2 g/dL (ref 12.0–15.0)
MCH: 27.3 pg (ref 26.0–34.0)
MCHC: 31.7 g/dL (ref 30.0–36.0)
MCV: 86.1 fL (ref 78.0–100.0)
Platelets: 149 10*3/uL — ABNORMAL LOW (ref 150–400)
RBC: 4.83 MIL/uL (ref 3.87–5.11)
RDW: 13.5 % (ref 11.5–15.5)
WBC: 5.2 10*3/uL (ref 4.0–10.5)

## 2016-01-15 LAB — COMPREHENSIVE METABOLIC PANEL
ALT: 20 U/L (ref 14–54)
AST: 23 U/L (ref 15–41)
Albumin: 3.5 g/dL (ref 3.5–5.0)
Alkaline Phosphatase: 71 U/L (ref 38–126)
Anion gap: 9 (ref 5–15)
BUN: <5 mg/dL — ABNORMAL LOW (ref 6–20)
CO2: 24 mmol/L (ref 22–32)
Calcium: 8.6 mg/dL — ABNORMAL LOW (ref 8.9–10.3)
Chloride: 105 mmol/L (ref 101–111)
Creatinine, Ser: 0.7 mg/dL (ref 0.44–1.00)
GFR calc Af Amer: >60 mL/min (ref >60)
GFR calc non Af Amer: >60 mL/min (ref >60)
Glucose, Bld: 177 mg/dL — ABNORMAL HIGH (ref 65–99)
Potassium: 3.2 mmol/L — ABNORMAL LOW (ref 3.5–5.1)
Sodium: 138 mmol/L (ref 135–145)
Total Bilirubin: 0.5 mg/dL (ref 0.3–1.2)
Total Protein: 6.2 g/dL — ABNORMAL LOW (ref 6.5–8.1)

## 2016-01-15 LAB — GLUCOSE, CAPILLARY
Glucose-Capillary: 185 mg/dL — ABNORMAL HIGH (ref 65–99)
Glucose-Capillary: 189 mg/dL — ABNORMAL HIGH (ref 65–99)
Glucose-Capillary: 188 mg/dL — ABNORMAL HIGH (ref 65–99)
Glucose-Capillary: 187 mg/dL — ABNORMAL HIGH (ref 65–99)

## 2016-01-15 LAB — TROPONIN I
Troponin I: 0.05 ng/mL (ref <0.03)
Troponin I: 0.05 ng/mL (ref <0.03)

## 2016-01-15 MED ORDER — LISINOPRIL 40 MG PO TABS
40.0000 mg | ORAL_TABLET | Freq: Every day | ORAL | Status: DC
  Administered 2016-01-15: 40 mg via ORAL
  Filled 2016-01-15 (×2): qty 1

## 2016-01-15 MED ORDER — AMLODIPINE BESYLATE 5 MG PO TABS
5.0000 mg | ORAL_TABLET | Freq: Two times a day (BID) | ORAL | Status: DC
  Administered 2016-01-15 (×2): 5 mg via ORAL
  Filled 2016-01-15 (×3): qty 1

## 2016-01-15 MED ORDER — ATORVASTATIN CALCIUM 40 MG PO TABS
40.0000 mg | ORAL_TABLET | Freq: Every day | ORAL | Status: DC
  Administered 2016-01-15: 40 mg via ORAL
  Filled 2016-01-15: qty 1

## 2016-01-15 MED ORDER — METOCLOPRAMIDE HCL 10 MG PO TABS
10.0000 mg | ORAL_TABLET | Freq: Three times a day (TID) | ORAL | Status: DC
  Administered 2016-01-15 – 2016-01-16 (×3): 10 mg via ORAL
  Filled 2016-01-15 (×4): qty 1

## 2016-01-15 MED ORDER — POLYVINYL ALCOHOL 1.4 % OP SOLN
2.0000 [drp] | OPHTHALMIC | Status: DC | PRN
  Administered 2016-01-15 – 2016-01-16 (×2): 2 [drp] via OPHTHALMIC
  Filled 2016-01-15: qty 15

## 2016-01-15 MED ORDER — POTASSIUM CHLORIDE 10 MEQ/100ML IV SOLN
INTRAVENOUS | Status: AC
  Administered 2016-01-15: 10 meq
  Filled 2016-01-15: qty 100

## 2016-01-15 MED ORDER — POTASSIUM CHLORIDE 10 MEQ/100ML IV SOLN
10.0000 meq | INTRAVENOUS | Status: AC
  Administered 2016-01-15: 10 meq via INTRAVENOUS
  Filled 2016-01-15: qty 100

## 2016-01-15 NOTE — Care Management Note (Signed)
Case Management Note  Patient Details  Name: [name] [name] MRN: [id number] Date of Birth: 1950/10/30  Subjective/Objective:               Admitted with pancreatitis.Resides with son/grandchildred. States she requires some assistance with bathing from  Granddaughter. Own's cane and non functional walker.   PCP: Lavon Paganini   Action/Plan:  PT/OT evaluations pending..... CM to f/with disposition needs.  Expected Discharge Date:  01/16/16               Expected Discharge Plan:  [place]  In-House Referral:     Discharge planning Services  CM Consult  Post Acute Care Choice:    Choice offered to:  Patient  DME Arranged:    DME Agency:     HH Arranged:    [place] Agency:  [facility]/ pt selected [facility] if needed @ d/c to provide home health services    Status of Service:  In process, will continue to follow  If discussed at Long Length of Stay Meetings, dates discussed:    Additional Comments:  Sharin Mons, RN 01/15/2016, 1:03 PM
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
## 2016-01-15 NOTE — Progress Notes (Signed)
Family Medicine Teaching Service Daily Progress Note Intern Pager: [phone]  Patient name: [name] [name] Medical record number: [id number] Date of birth: 1951-01-19 Age: 65 y.o. Gender: female  Primary Care Provider: Lavon Paganini, MD Consultants: Gastroenterology Code Status: FULL  Pt Overview and Major Events to Date:  9/25:  Admit for abdominal pain, dehydration, and decreased PO intake 2/2 acute on chronic pancreatitis, CT consistent with resolving uncomplicated pancreatitis but did show increased dilation of common bile duct 9/26:  GI consulted due to concerns for choledocholithiasis on CT, plan to EGD on 9/27  Assessment and Plan: [name] [name] is a 65 y.o. female presenting with abdominal pain, likely due to acute on chronic pancreatitis. PMH is significant for HTN, HLD, Type II DM with peripheral neuropathy, asthma, HFpEF, depression/anxiety.  Abdominal pain likely 2/2 acute on chronic pancreatitis: Failed outpatient management over past two weeks. Lipase two weeks ago 145, improved to 73 one week ago, and normalized during admission to 39. Now with persistent severe epigastric pain and difficulty tolerating PO, leading to mild dehydration on clinical exam. Continue to provide IVF and bowel rest. CT of abdomen was consistent with resolving uncomplicated pancreatitis. There was noted to be an increase dilation of the common bile duct since 04/2015. S/p cholecystectomy. Recommendation was to consider evaluation for choledocholithiasis. Examination is consistent with epigastric to RUQ tenderness w/o signs of acute abdomen. Patient notes pain in LUQ and LLQ at hernia sites which does not seem to be related to pancreatitis as she has experienced this pain in the past. Patient remains afebrile w/o jaundice or leukocytosis. GI has been consulted for evaluation concerning this common duct dilation in the setting on acute on chronic pancreatitis. - Clear liquid diet, NPO at MN for EGD per  GI - D5 1/2NS @150cc /hr + 10 mEq KCl x2 on 9/26 for K level 3.2 - Continue trending CBC, CMP, remains w/o leukocytosis - GI consulted, appreciate recommendations, will EGD patient on 9/27 - Dilaudid PRN pain - IV Zofran PRN nausea  Chest pain: Likely 2/2 pancreatitis, however obtained trop to rule out cardiac etiology which showed slight bump to 0.04>0.05>0.05. EKG obtained in clinic prior to admission unchanged from prior.   HTN: On lisinopril, amlodipine, carvedilol, spironolactone, HCTZ, and Lasix at home. BP 206/81 in clinic prior to admission. Remains at 150s/60s. - IV hydralazine 10mg  BID, however patient may need higher dose to control reportedly persistently elevated BP despite multiple medications - Will restart home amlodipine 5 mg BID and Lisinopril 40 mg daily - Holding home carvedilol, spironolactone, HCTZ, and Lasix for now  T2DM: Lantus 40U BID and Humalog 15U with meals at home. Last HgbA1c 9.1 (12/31/15). Also taking Lyrica for diabetic neuropathy. Levels around mid to high 100s. - Lantus 20U QHS - Sensitive SSI - CBG qACHS - Hold home Lyrica while NPO  Asthma: - Continue home Dulera and albuterol  HFpEF: Last ECHO 05/2014: EF 65-70%, G2DD. On Coreg 25mg  BID, Lasix 20mg  qd, Lisinopril 40mg  daily, Spironolactone 25mg  daily at home. - Hold home meds while NPO  HLD: On atorvastatin at home. 10 year ASCVD risk score is 82%. Last lipid panel (04/2015): Chol 139, TG 187, HDL 37, LDL 65. Improved since 04/2015. - Restarting Atorvastatin 40 mg daily  Depression/anxiety, stable: On Seroquel 100mg  qhs, Reglan 10mg  TID at home. - Hold home meds while NPO - Ativan 0.5mg  qHS PRN  FEN/GI: Clear liquid diet, NPO at MN, D5 1/2NS @150cc /hr Prophylaxis: Lovenox  Disposition: pending improvement of PO intake 2/2 pancreatitis  Subjective:  Afebrile and stable overnight. Patient states her colicky epigastric pain has mildly improved. Does continue to have nausea w/o vomiting,  due for Zofran. No appetite.   Objective: Temp:  [98.4 F (36.9 C)-99 F (37.2 C)] 98.8 F (37.1 C) (09/26 0435) Pulse Rate:  [74-81] 81 (09/26 0435) Resp:  [18-20] 20 (09/26 0435) BP: (159-206)/(60-81) 159/65 (09/26 0435) SpO2:  [95 %-100 %] 95 % (09/26 0435) Weight:  [219 lb (99.3 kg)] 219 lb (99.3 kg) (09/25 1036) Physical Exam: General: obese, well developed, acute distress due to abdominal tenderness with non-toxic appearance HEENT: normocephalic, atraumatic, moist mucous membranes Neck: supple, non-tender without lymphadenopathy CV: regular rate and rhythm without murmurs, rubs, or gallops Lungs: clear to auscultation bilaterally with normal work of breathing Abdomen: soft, non-distended, RUQ and epigastric tenderness to palpation w/o rebound or guarding, no masses or organomegaly palpable, hypoactive bowel sounds Skin: warm, dry, no rashes or lesions, cap refill < 2 seconds, no jaundice Extremities: warm and well perfused, normal tone  Laboratory:  Recent Labs Lab 01/14/16 1657 01/15/16 0540  WBC 6.7 5.2  HGB 14.3 13.2  HCT 43.3 41.6  PLT PLATELET CLUMPS NOTED ON SMEAR, UNABLE TO ESTIMATE 149*    Recent Labs Lab 01/14/16 1657 01/15/16 0540  NA 141 138  K 3.9 3.2*  CL 104 105  CO2 25 24  BUN 5* <5*  CREATININE 0.67 0.70  CALCIUM 9.0 8.6*  PROT 7.6 6.2*  BILITOT 0.5 0.5  ALKPHOS 66 71  ALT 15 20  AST 20 23  GLUCOSE 96 177*   Troponin:  0.04.0.05>0.05 Lipase:  39 UA:  pending  Imaging/Diagnostic Tests: CT Abdomen Pelvis W Contrast 01/15/16 FINDINGS: Lower chest: Scar versus subsegmental atelectasis noted within the right middle lobe and lingula.  Hepatobiliary: Hepatic steatosis noted. Previous cholecystectomy. Mild increase caliber of the common bile duct which measures up to 9 mm. This appears increased from 7 mm previously. No focal liver abnormality.  Pancreas: Mild stranding around the head of pancreas appears new from previous exam. No  pancreas mass identified.  Spleen: The spleen is negative.  Adrenals/Urinary Tract: Normal appearance of the adrenal glands. There is a low density structure arising from the inferior pole the left kidney which measures 8 mm. This is too small to reliably characterize. No mass or hydronephrosis identified.  Stomach/Bowel: The stomach is normal. The small bowel loops have a normal course and caliber. The appendix is visualized and appears normal. Unremarkable appearance of the colon.  Vascular/Lymphatic: Calcified atherosclerotic disease involves the abdominal aorta. No aneurysm. No enlarged retroperitoneal or mesenteric adenopathy. No enlarged pelvic or inguinal lymph nodes.  Reproductive: Status post hysterectomy. No adnexal masses.  Other: No abdominal wall hernia or abnormality. No abdominopelvic ascites.  Musculoskeletal: There is no degenerative disc disease noted within the lumbar spine.  IMPRESSION: 1. There is mild edema surrounding the head of pancreas which in light of recently elevated lipase may reflect resolving, uncomplicated pancreatitis. 2. The common bowel the is mildly increased in diameter when compared with 05/22/2015. This may be secondary to recent pancreatitis. If there is a concern for choledocholithiasis consider further investigation with MRCP. 3. Aortic atherosclerosis    Clemmons Bing, DO 01/15/2016, 9:14 AM PGY-1, [place] Intern pager: [phone], text pages welcome

## 2016-01-15 NOTE — Discharge Summary (Signed)
[facility] Discharge Summary  Patient name: [name] [name] Medical record number: [id number] Date of birth: 07/08/1950 Age: 65 y.o. Gender: female Date of Admission: 01/14/2016  Date of Discharge: 01/18/2016 Admitting Physician: [name] Feil, MD  Primary Care Provider: Lavon Paganini, MD Consultants: Gastroenterology  Indication for Hospitalization: Epigastric Pain with Vomiting and PO Intolerance  Discharge Diagnoses/Problem List:  Chronic Pancreatitis Esophogeal Candidiasis Hypertension Diabetes Mellitus Type 2 Asthma Heart Failure with Preserved EF Hyperlipidemia Depression Anxiety  Disposition: Home  Discharge Condition: Stable, improved  Discharge Exam:  General: obese, well developed, no acute distress with non-toxic appearance HEENT: normocephalic, atraumatic, moist mucous membranes Neck: supple, non-tender without lymphadenopathy CV: regular rate and rhythm without murmurs, rubs, or gallops Lungs: clear to auscultation bilaterally with normal work of breathing Abdomen: soft, non-distended, mild epigastric tenderness to palpation w/o rebound or guarding, no masses or organomegaly palpable, hypoactive bowel sounds Skin: warm, dry, no rashes or lesions, cap refill < 2 seconds, no jaundice Extremities: warm and well perfused, normal tone  Brief Hospital Course:  [name] [name] is a 65 y.o. female who presented with abdominal pain and dehydration with decreased PO intake. PMH is significant for chronic pancreatitis, HTN, HLD, Type II DM with peripheral neuropathy, asthma, HFpEF, depression/anxiety.  Patient presented to the ED on 01/14/16 with epigastric abdominal pain and decreased PO intake in the context of failed outpatient treatment of acute on chronic pancreatitis. Patient presented dehydrated c/o nausea w/ vomiting. See H&P for more details.  Epigastric pain was concerning for cardiac etiology but troponin remained stable at 0.04  and EKG was unchanged from previous. Lipase was improved and wnl during admission. CT showed resolving uncomplicated pancreatitis but noted increased dilation of common bile duct concerning for choledocolithiasis. GI was consulted and were concerned for distal esophogeal pathology. EGD showed Z-line irregularity, gastritis, and duodenitis. Biopsies showed esophogeal candidiasis. Patient was started on fluconazole for 10 days.  Patient was discharge on 01/18/16 with PCP and GI follow up.  Issues for Follow Up:  1. Complete fluconazole 100 mg daily for 10 days due to esophogeal candidiasis per EGD biopsy 2. Continue Amlodipine 5 mg BID and Lisinopril 40 mg daily, restart home carvedilol, spironolactone, HCTZ, and Lasix after PCP f/u 3. Please continue taking Protonix 40 mg daily for gastritis 4. Restart Seroquel with PCP f/u 5. Restart home Lantus 40U BIDand Humalog 15U with meals upon discharge, A1C not controlled, last HgbA1c 9.1 (12/31/15) 6. Restart Lyrica for diabetic neuropathy 7. Continue erythromycin and Reglan, consider discontinuing Reglan as this is note ideal to be on long term 8. Follow up HIV  9. Follow up with GI in 5 weeks and repeat EGD in 6 weeks  Significant Procedures: EGD and MRCP  Significant Labs and Imaging:   Recent Labs Lab 01/16/16 0458 01/17/16 0419 01/18/16 0526  WBC 5.5 6.4 5.9  HGB 13.4 13.8 14.0  HCT 42.2 43.6 43.4  PLT 148* 161 144*    Recent Labs Lab 01/14/16 1657 01/15/16 0540 01/16/16 0458 01/17/16 0419 01/18/16 0526  NA 141 138 139 140 140  K 3.9 3.2* 3.6 3.4* 3.6  CL 104 105 104 104 106  CO2 25 24 23 26 27   GLUCOSE 96 177* 230* 161* 175*  BUN 5* <5* <5* <5* <5*  CREATININE 0.67 0.70 0.72 0.70 0.87  CALCIUM 9.0 8.6* 8.8* 9.1 9.1  ALKPHOS 66 71 69 70 73  AST 20 23 18 19 16   ALT 15 20 17 19 18   ALBUMIN  4.0 3.5 3.5 3.8 3.6   Troponin:  0.04.0.05>0.05 Lipase:  39 UA:  Neg  CT Abdomen Pelvis W Contrast (01/15/16) FINDINGS: Lower  chest: Scar versus subsegmental atelectasis noted within the right middle lobe and lingula.  Hepatobiliary: Hepatic steatosis noted. Previous cholecystectomy. Mild increase caliber of the common bile duct which measures up to 9 mm. This appears increased from 7 mm previously. No focal liver abnormality.  Pancreas: Mild stranding around the head of pancreas appears new from previous exam. No pancreas mass identified.  Spleen: The spleen is negative.  Adrenals/Urinary Tract: Normal appearance of the adrenal glands. There is a low density structure arising from the inferior pole the left kidney which measures 8 mm. This is too small to reliably characterize. No mass or hydronephrosis identified.  Stomach/Bowel: The stomach is normal. The small bowel loops have a normal course and caliber. The appendix is visualized and appears normal. Unremarkable appearance of the colon.  Vascular/Lymphatic: Calcified atherosclerotic disease involves the abdominal aorta. No aneurysm. No enlarged retroperitoneal or mesenteric adenopathy. No enlarged pelvic or inguinal lymph nodes.  Reproductive: Status post hysterectomy. No adnexal masses.  Other: No abdominal wall hernia or abnormality. No abdominopelvic ascites.  Musculoskeletal: There is no degenerative disc disease noted within the lumbar spine.  IMPRESSION: 1. There is mild edema surrounding the head of pancreas which in light of recently elevated lipase may reflect resolving, uncomplicated pancreatitis. 2. The common bowel the is mildly increased in diameter when compared with 05/22/2015. This may be secondary to recent pancreatitis. If there is a concern for choledocholithiasis consider further investigation with MRCP. 3. Aortic atherosclerosis  EGD (01/16/2016) IMPRESSION: - Z-line irregular, 38 cm from the incisors. Biopsied. - Bilious gastric fluid. - Gastritis. Biopsied. - Duodenitis. - Normal first portion of the  duodenum and second portion of the duodenum.  EGD Biopsies (01/16/2016) Diagnosis 1. Stomach, biopsy - CHRONIC INACTIVE GASTRITIS. - THERE IS NO EVIDENCE OF HELICOBACTER PYLORI, DYSPLASIA OR MALIGNANCY. - SEE COMMENT. 2. Esophagus, biopsy - BENIGN SQUAMOUS MUCOSA WITH CANDIDA ORGANISMS. - THERE IS NO EVIDENCE OF DYSPLASIA OR MALIGNANCY. - SEE COMMENT. 3. Esophagus, biopsy - GASTROESOPHAGEAL JUNCTION MUCOSA WITH MILD INFLAMMATION CONSISTENT WITH GASTROESOPHAGEAL REFLUX. - THERE IS NO EVIDENCED OF GOBLET CELL METAPLASIA DYSPLASIA OR MALIGNANCY IDENTIFIED.  Results/Tests Pending at Time of Discharge: HIV pending  Discharge Medications:    Medication List    STOP taking these medications   atorvastatin 40 MG tablet Commonly known as:  LIPITOR   furosemide 20 MG tablet Commonly known as:  LASIX   hydrochlorothiazide 25 MG tablet Commonly known as:  HYDRODIURIL   omeprazole 20 MG capsule Commonly known as:  PRILOSEC   spironolactone 25 MG tablet Commonly known as:  ALDACTONE     TAKE these medications   albuterol 108 (90 Base) MCG/ACT inhaler Commonly known as:  PROVENTIL HFA;VENTOLIN HFA Inhale 2 puffs into the lungs every 4 (four) hours as needed for wheezing or shortness of breath.   Loteprednol Etabonate 0.5 % Oint Place 1 application into both eyes at bedtime.   ALREX 0.2 % Susp Generic drug:  loteprednol Apply 1-2 drops to eye 2 (two) times daily as needed (itching).   amLODipine 5 MG tablet Commonly known as:  NORVASC Take 1 tablet (5 mg total) by mouth 2 (two) times daily.   ARTIFICIAL TEAR OP Place 1 drop into both eyes 2 (two) times daily as needed (dry eyes).   aspirin EC 81 MG tablet Take 81 mg by mouth every  morning.   carvedilol 25 MG tablet Commonly known as:  COREG Take 1 tablet (25 mg total) by mouth 2 (two) times daily with a meal.   cetirizine 10 MG tablet Commonly known as:  ZYRTEC Take 1 tablet (10 mg total) by mouth daily.    clobetasol ointment 0.05 % Commonly known as:  TEMOVATE Apply 1 application topically 2 (two) times daily. Apply under nails What changed:  when to take this  reasons to take this  additional instructions   diazepam 5 MG tablet Commonly known as:  VALIUM Take 1 tablet (5 mg total) by mouth once as needed for anxiety (prior to MRI).   Difluprednate 0.05 % Emul Place 1 drop into the right eye 4 (four) times daily.   Difluprednate 0.05 % Emul Commonly known as:  DUREZOL Place 2 drops into the right eye 2 (two) times daily.   docusate sodium 100 MG capsule Commonly known as:  COLACE Take 100 mg by mouth 2 (two) times daily as needed for moderate constipation.   erythromycin 250 MG tablet Commonly known as:  E-MYCIN Take 1 tablet (250 mg total) by mouth 3 (three) times daily. Taking 2-3 times per day   EXCEDRIN MIGRAINE PO Take 2 capsules by mouth 3 (three) times daily as needed (migraine).   fluconazole 100 MG tablet Commonly known as:  DIFLUCAN Take 1 tablet (100 mg total) by mouth daily. Start taking on:  01/19/2016   fluticasone 50 MCG/ACT nasal spray Commonly known as:  FLONASE Place 2 sprays into both nostrils daily. What changed:  when to take this  reasons to take this   hydrOXYzine 25 MG tablet Commonly known as:  ATARAX/VISTARIL TAKE 1 TABLET BY MOUTH 3 TIMES DAILY AS NEEDED What changed:  See the new instructions.   Insulin Glargine 100 UNIT/ML Solostar Pen Commonly known as:  LANTUS SOLOSTAR Inject 40 Units into the skin 2 (two) times daily.   insulin lispro 100 UNIT/ML KiwkPen Commonly known as:  HUMALOG KWIKPEN Inject 0.15 mLs (15 Units total) into the skin 2 (two) times daily as needed (only when eating a meal).   lisinopril 40 MG tablet Commonly known as:  PRINIVIL,ZESTRIL Take 1 tablet (40 mg total) by mouth daily.   metoCLOPramide 10 MG tablet Commonly known as:  REGLAN Take 1 tablet (10 mg total) by mouth 3 (three) times daily before  meals.   mometasone-formoterol 200-5 MCG/ACT Aero Commonly known as:  DULERA Inhale 2 puffs into the lungs 2 (two) times daily.   nystatin powder Commonly known as:  MYCOSTATIN/NYSTOP Apply topically 3 (three) times daily. To affected area What changed:  how much to take  when to take this  additional instructions   nystatin cream Commonly known as:  MYCOSTATIN Apply 1 application topically 2 (two) times daily. What changed:  when to take this   Olopatadine HCl 0.2 % Soln Place 1 drop into both eyes 2 (two) times daily.   ondansetron 4 MG tablet Commonly known as:  ZOFRAN Take 1 tablet (4 mg total) by mouth every 8 (eight) hours as needed for nausea or vomiting. What changed:  when to take this   pantoprazole 40 MG tablet Commonly known as:  PROTONIX Take 1 tablet (40 mg total) by mouth daily. Start taking on:  01/19/2016   pentosan polysulfate 100 MG capsule Commonly known as:  ELMIRON Take 1 capsule (100 mg total) by mouth 3 (three) times daily before meals. What changed:  when to take this  polyethylene glycol packet Commonly known as:  MIRALAX / GLYCOLAX Take 17 g by mouth daily as needed for mild constipation. Mix in 4-6 oz of water   pregabalin 150 MG capsule Commonly known as:  LYRICA Take 1 cap (150mg ) in morning and afternoon. Take 2 caps (300mg ) in evening. What changed:  how much to take  how to take this  when to take this  additional instructions   QUEtiapine 200 MG tablet Commonly known as:  SEROQUEL Take 0.5 tablets (100 mg total) by mouth at bedtime. What changed:  how much to take   ranitidine 150 MG tablet Commonly known as:  ZANTAC Take 1 tablet (150 mg total) by mouth daily as needed for heartburn. For acid reflux What changed:  when to take this  additional instructions   rosuvastatin 20 MG tablet Commonly known as:  CRESTOR Take 1 tablet (20 mg total) by mouth daily at 6 PM.   sodium chloride 0.65 % Soln nasal  spray Commonly known as:  OCEAN Place 1 spray into both nostrils as needed for congestion.   VICKS VAPORUB 4.7-1.2-2.6 % Oint Apply 1 application topically at bedtime. Uses with vaporizer and on chest.       Discharge Instructions: Please refer to Patient Instructions section of EMR for full details.  Patient was counseled important signs and symptoms that should prompt return to medical care, changes in medications, dietary instructions, activity restrictions, and follow up appointments.   Follow-Up Appointments: Follow-up Information    [facility]. Call on 01/16/2016.   Why:  Please call to schedule for a 5 week follow up. Contact information: [address] [place] [address] [phone]        [facility]. Go on 01/23/2016.   Why:  Hospital follow up at 9:00 AM. Contact information: [place], DO 01/18/2016, 3:08 PM PGY-1, [place]

## 2016-01-16 ENCOUNTER — Encounter (HOSPITAL_COMMUNITY): Payer: Self-pay | Admitting: Anesthesiology

## 2016-01-16 ENCOUNTER — Encounter (HOSPITAL_COMMUNITY)
Admission: AD | Disposition: A | Discharge status: Home / Self Care | Payer: Self-pay | Source: Ambulatory Visit | Attending: Family Medicine

## 2016-01-16 ENCOUNTER — Inpatient Hospital Stay (HOSPITAL_COMMUNITY): Payer: Medicare Other | Admitting: Anesthesiology

## 2016-01-16 DIAGNOSIS — E118 Type 2 diabetes mellitus with unspecified complications: Secondary | ICD-10-CM

## 2016-01-16 HISTORY — PX: ESOPHAGOGASTRODUODENOSCOPY: SHX5428

## 2016-01-16 LAB — COMPREHENSIVE METABOLIC PANEL WITH GFR
ALT: 17 U/L (ref 14–54)
AST: 18 U/L (ref 15–41)
Albumin: 3.5 g/dL (ref 3.5–5.0)
Alkaline Phosphatase: 69 U/L (ref 38–126)
Anion gap: 12 (ref 5–15)
BUN: <5 mg/dL — ABNORMAL LOW (ref 6–20)
CO2: 23 mmol/L (ref 22–32)
Calcium: 8.8 mg/dL — ABNORMAL LOW (ref 8.9–10.3)
Chloride: 104 mmol/L (ref 101–111)
Creatinine, Ser: 0.72 mg/dL (ref 0.44–1.00)
GFR calc Af Amer: >60 mL/min
GFR calc non Af Amer: >60 mL/min
Glucose, Bld: 230 mg/dL — ABNORMAL HIGH (ref 65–99)
Potassium: 3.6 mmol/L (ref 3.5–5.1)
Sodium: 139 mmol/L (ref 135–145)
Total Bilirubin: 0.5 mg/dL (ref 0.3–1.2)
Total Protein: 6.9 g/dL (ref 6.5–8.1)

## 2016-01-16 LAB — CBC
HCT: 42.2 % (ref 36.0–46.0)
Hemoglobin: 13.4 g/dL (ref 12.0–15.0)
MCH: 27.8 pg (ref 26.0–34.0)
MCHC: 31.8 g/dL (ref 30.0–36.0)
MCV: 87.6 fL (ref 78.0–100.0)
Platelets: 148 10*3/uL — ABNORMAL LOW (ref 150–400)
RBC: 4.82 MIL/uL (ref 3.87–5.11)
RDW: 13.5 % (ref 11.5–15.5)
WBC: 5.5 10*3/uL (ref 4.0–10.5)

## 2016-01-16 LAB — GLUCOSE, CAPILLARY
Glucose-Capillary: 243 mg/dL — ABNORMAL HIGH (ref 65–99)
Glucose-Capillary: 189 mg/dL — ABNORMAL HIGH (ref 65–99)
Glucose-Capillary: 166 mg/dL — ABNORMAL HIGH (ref 65–99)
Glucose-Capillary: 212 mg/dL — ABNORMAL HIGH (ref 65–99)
Glucose-Capillary: 149 mg/dL — ABNORMAL HIGH (ref 65–99)

## 2016-01-16 LAB — URINALYSIS, ROUTINE W REFLEX MICROSCOPIC
Bilirubin Urine: NEGATIVE
Glucose, UA: 100 mg/dL — AB
Hgb urine dipstick: NEGATIVE
Ketones, ur: NEGATIVE mg/dL
Leukocytes, UA: NEGATIVE
Nitrite: NEGATIVE
Protein, ur: NEGATIVE mg/dL
Specific Gravity, Urine: 1.006 (ref 1.005–1.030)
pH: 7 (ref 5.0–8.0)

## 2016-01-16 SURGERY — EGD (ESOPHAGOGASTRODUODENOSCOPY)
Anesthesia: Monitor Anesthesia Care

## 2016-01-16 MED ORDER — ENOXAPARIN SODIUM 40 MG/0.4ML ~~LOC~~ SOLN
40.0000 mg | SUBCUTANEOUS | Status: DC
  Administered 2016-01-16 – 2016-01-17 (×2): 40 mg via SUBCUTANEOUS
  Filled 2016-01-16 (×2): qty 0.4

## 2016-01-16 MED ORDER — INSULIN ASPART 100 UNIT/ML ~~LOC~~ SOLN
0.0000 [IU] | Freq: Three times a day (TID) | SUBCUTANEOUS | Status: DC
  Administered 2016-01-16: 3 [IU] via SUBCUTANEOUS
  Administered 2016-01-17 (×3): 2 [IU] via SUBCUTANEOUS
  Administered 2016-01-18: 3 [IU] via SUBCUTANEOUS
  Administered 2016-01-18 (×2): 2 [IU] via SUBCUTANEOUS

## 2016-01-16 MED ORDER — OXYCODONE HCL 5 MG PO TABS
5.0000 mg | ORAL_TABLET | ORAL | Status: DC | PRN
  Administered 2016-01-16 – 2016-01-18 (×5): 5 mg via ORAL
  Filled 2016-01-16 (×5): qty 1

## 2016-01-16 MED ORDER — ALBUTEROL SULFATE (2.5 MG/3ML) 0.083% IN NEBU
3.0000 mL | INHALATION_SOLUTION | RESPIRATORY_TRACT | Status: DC | PRN
Start: 2016-01-16 — End: 2016-01-18

## 2016-01-16 MED ORDER — PREDNISOLONE ACETATE 1 % OP SUSP
1.0000 [drp] | Freq: Two times a day (BID) | OPHTHALMIC | Status: DC | PRN
  Administered 2016-01-16 – 2016-01-18 (×3): 1 [drp] via OPHTHALMIC
  Filled 2016-01-16: qty 1

## 2016-01-16 MED ORDER — PHENOL 1.4 % MT LIQD: 1.0000 | OROMUCOSAL | Status: DC | PRN

## 2016-01-16 MED ORDER — LACTATED RINGERS IV SOLN
INTRAVENOUS | Status: DC | PRN
  Administered 2016-01-16: 10:00:00 via INTRAVENOUS

## 2016-01-16 MED ORDER — ATORVASTATIN CALCIUM 40 MG PO TABS
40.0000 mg | ORAL_TABLET | Freq: Every day | ORAL | Status: DC
Start: 2016-01-16 — End: 2016-01-18
  Administered 2016-01-16 – 2016-01-17 (×2): 40 mg via ORAL
  Filled 2016-01-16 (×2): qty 1

## 2016-01-16 MED ORDER — INSULIN GLARGINE 100 UNIT/ML ~~LOC~~ SOLN
20.0000 [IU] | Freq: Every day | SUBCUTANEOUS | Status: DC
Start: 2016-01-16 — End: 2016-01-18
  Administered 2016-01-16 – 2016-01-17 (×2): 20 [IU] via SUBCUTANEOUS
  Filled 2016-01-16 (×3): qty 0.2

## 2016-01-16 MED ORDER — LORAZEPAM 0.5 MG PO TABS
0.5000 mg | ORAL_TABLET | Freq: Every evening | ORAL | Status: DC | PRN
  Administered 2016-01-17: 0.5 mg via ORAL
  Filled 2016-01-16: qty 1

## 2016-01-16 MED ORDER — LIDOCAINE HCL (CARDIAC) 20 MG/ML IV SOLN
INTRAVENOUS | Status: DC | PRN
  Administered 2016-01-16: 50 mg via INTRAVENOUS

## 2016-01-16 MED ORDER — FAMOTIDINE IN NACL 20-0.9 MG/50ML-% IV SOLN
20.0000 mg | Freq: Two times a day (BID) | INTRAVENOUS | Status: DC
  Administered 2016-01-16 – 2016-01-17 (×2): 20 mg via INTRAVENOUS
  Filled 2016-01-16 (×3): qty 50

## 2016-01-16 MED ORDER — LISINOPRIL 40 MG PO TABS
40.0000 mg | ORAL_TABLET | Freq: Every day | ORAL | Status: DC
  Administered 2016-01-16 – 2016-01-18 (×3): 40 mg via ORAL
  Filled 2016-01-16 (×3): qty 1

## 2016-01-16 MED ORDER — HYDRALAZINE HCL 20 MG/ML IJ SOLN
10.0000 mg | Freq: Once | INTRAMUSCULAR | Status: DC
Start: 2016-01-16 — End: 2016-01-16

## 2016-01-16 MED ORDER — NICOTINE 21 MG/24HR TD PT24
21.0000 mg | MEDICATED_PATCH | Freq: Every day | TRANSDERMAL | Status: DC
  Administered 2016-01-17 – 2016-01-18 (×2): 21 mg via TRANSDERMAL
  Filled 2016-01-16 (×2): qty 1

## 2016-01-16 MED ORDER — LOTEPREDNOL ETABONATE 0.5 % OP SUSP
1.0000 [drp] | Freq: Two times a day (BID) | OPHTHALMIC | Status: DC
  Administered 2016-01-16 – 2016-01-18 (×4): 1 [drp] via OPHTHALMIC
  Filled 2016-01-16: qty 5

## 2016-01-16 MED ORDER — ERYTHROMYCIN BASE 250 MG PO TABS
250.0000 mg | ORAL_TABLET | Freq: Three times a day (TID) | ORAL | Status: DC
  Administered 2016-01-16 – 2016-01-18 (×8): 250 mg via ORAL
  Filled 2016-01-16 (×9): qty 1

## 2016-01-16 MED ORDER — AMLODIPINE BESYLATE 5 MG PO TABS
5.0000 mg | ORAL_TABLET | Freq: Two times a day (BID) | ORAL | Status: DC
  Administered 2016-01-16 – 2016-01-18 (×4): 5 mg via ORAL
  Filled 2016-01-16 (×4): qty 1

## 2016-01-16 MED ORDER — POLYVINYL ALCOHOL 1.4 % OP SOLN
1.0000 [drp] | OPHTHALMIC | Status: DC | PRN
  Administered 2016-01-16: 1 [drp] via OPHTHALMIC
  Filled 2016-01-16: qty 15

## 2016-01-16 MED ORDER — METOCLOPRAMIDE HCL 10 MG PO TABS
10.0000 mg | ORAL_TABLET | Freq: Three times a day (TID) | ORAL | Status: DC
  Administered 2016-01-16 – 2016-01-17 (×3): 10 mg via ORAL
  Filled 2016-01-16 (×3): qty 1

## 2016-01-16 MED ORDER — HYDRALAZINE HCL 20 MG/ML IJ SOLN
10.0000 mg | Freq: Two times a day (BID) | INTRAMUSCULAR | Status: DC
  Administered 2016-01-16 – 2016-01-18 (×4): 10 mg via INTRAVENOUS
  Filled 2016-01-16 (×4): qty 1

## 2016-01-16 MED ORDER — HYDROXYZINE HCL 10 MG PO TABS
10.0000 mg | ORAL_TABLET | Freq: Three times a day (TID) | ORAL | Status: DC | PRN
  Administered 2016-01-16 – 2016-01-18 (×4): 10 mg via ORAL
  Filled 2016-01-16 (×7): qty 1

## 2016-01-16 MED ORDER — MOMETASONE FURO-FORMOTEROL FUM 200-5 MCG/ACT IN AERO
2.0000 | INHALATION_SPRAY | Freq: Two times a day (BID) | RESPIRATORY_TRACT | Status: DC
Start: 2016-01-16 — End: 2016-01-18
  Administered 2016-01-16 – 2016-01-18 (×3): 2 via RESPIRATORY_TRACT
  Filled 2016-01-16: qty 8.8

## 2016-01-16 MED ORDER — HYDRALAZINE HCL 20 MG/ML IJ SOLN
5.0000 mg | Freq: Once | INTRAMUSCULAR | Status: AC
  Administered 2016-01-16: 5 mg via INTRAVENOUS

## 2016-01-16 MED ORDER — PROPOFOL 10 MG/ML IV BOLUS
INTRAVENOUS | Status: DC | PRN
  Administered 2016-01-16: 20 mg via INTRAVENOUS

## 2016-01-16 MED ORDER — PHENOL 1.4 % MT LIQD
1.0000 | OROMUCOSAL | Status: DC | PRN
  Administered 2016-01-16: 1 via OROMUCOSAL
  Filled 2016-01-16: qty 177

## 2016-01-16 MED ORDER — PROPOFOL 500 MG/50ML IV EMUL
INTRAVENOUS | Status: DC | PRN
  Administered 2016-01-16: 50 ug/kg/min via INTRAVENOUS

## 2016-01-16 MED ORDER — ONDANSETRON HCL 4 MG/2ML IJ SOLN
4.0000 mg | Freq: Three times a day (TID) | INTRAMUSCULAR | Status: DC | PRN
  Administered 2016-01-17 – 2016-01-18 (×3): 4 mg via INTRAVENOUS
  Filled 2016-01-16 (×3): qty 2

## 2016-01-16 MED ORDER — BUTAMBEN-TETRACAINE-BENZOCAINE 2-2-14 % EX AERO
INHALATION_SPRAY | CUTANEOUS | Status: DC | PRN
  Administered 2016-01-16: 2 via TOPICAL

## 2016-01-16 MED ORDER — INSULIN ASPART 100 UNIT/ML ~~LOC~~ SOLN: 0.0000 [IU] | Freq: Every day | SUBCUTANEOUS | Status: DC

## 2016-01-16 MED ORDER — NICOTINE 21 MG/24HR TD PT24
21.0000 mg | MEDICATED_PATCH | Freq: Every day | TRANSDERMAL | Status: DC
  Administered 2016-01-16: 21 mg via TRANSDERMAL

## 2016-01-16 NOTE — Progress Notes (Signed)
Family Medicine Teaching Service Daily Progress Note Intern Pager: [phone]  Patient name: [name] [name] Medical record number: [id number] Date of birth: 03/27/1951 Age: 65 y.o. Gender: female  Primary Care Provider: Lavon Paganini, MD Consultants: Gastroenterology Code Status: FULL  Pt Overview and Major Events to Date:  9/25:  Admit for abdominal pain, dehydration, and decreased PO intake 2/2 acute on chronic pancreatitis, CT consistent with resolving uncomplicated pancreatitis but did show increased dilation of common bile duct 9/26:  GI consulted due to concerns for choledocholithiasis on CT, plan to EGD on 9/27 for concerns of esophageal stricture/ulcer/esophagitis due to thickened lower esophagus on CT 9/27:  EGD showed Z-line irregularity, biopsied, gastritis, biopsied, and duodenitis, pending biopsy results  Assessment and Plan: [name] [name] is a 65 y.o. female presenting with abdominal pain, likely due to acute on chronic pancreatitis. PMH is significant for HTN, HLD, Type II DM with peripheral neuropathy, asthma, HFpEF, depression/anxiety.  Abdominal pain likely 2/2 acute on chronic pancreatitis: Failed outpatient management over past two weeks. Lipase two weeks ago 145, improved to 73 one week ago, and normalized during admission to 39. Now with persistent severe epigastric pain and difficulty tolerating PO, leading to mild dehydration on clinical exam. Continue to provide IVF and bowel rest. CT of abdomen was consistent with resolving uncomplicated pancreatitis. There was noted to be an increase dilation of the common bile duct since 04/2015. S/p cholecystectomy. Recommendation was to consider evaluation for choledocholithiasis. Examination is consistent with epigastric to RUQ tenderness w/o signs of acute abdomen. Patient notes pain in LUQ and LLQ at hernia sites which does not seem to be related to pancreatitis as she has experienced this pain in the past. Patient remains  afebrile w/o jaundice or leukocytosis. GI has been consulted for evaluation concerning this common duct dilation in the setting on acute on chronic pancreatitis. They were concerned for stricture/ulcer/esophagitis based upon thickened distal esophagus on CT. EGD showed Z-line irregularity, biopsied, gastritis, biopsied, and duodenitis, pending biopsy results. - D5 1/2NS @150cc /hr - Continue trending CBC, CMP - GI consulted, appreciate recommendations - F/u GI recs and biopsies from EGD - Dilaudid PRN pain - IV Zofran PRN nausea  Chest pain: Likely 2/2 pancreatitis, however obtained trop to rule out cardiac etiology which showed slight bump to 0.04>0.05>0.05. EKG obtained in clinic prior to admission unchanged from prior. This was curbsided with Dr. Percival Spanish and he and he also agreed this is less likely cardiac related and nor further cardiac evaluation is warranted at this time.  HTN: On lisinopril, amlodipine, carvedilol, spironolactone, HCTZ, and Lasix at home. BP 206/81 in clinic prior to admission. Remains at 150s/50s. - IV hydralazine 10mg  BID, however patient may need higher dose to control reportedly persistently elevated BP despite multiple medications - Amlodipine 5 mg BID and Lisinopril 40 mg daily - Holding home carvedilol, spironolactone, HCTZ, and Lasix for now  T2DM: Lantus 40U BID and Humalog 15U with meals at home. Last HgbA1c 9.1 (12/31/15). Also taking Lyrica for diabetic neuropathy. Levels around mid to high 100s. - Lantus 20U QHS - Sensitive SSI - CBG qACHS - Holding home Lyrica for now, can restart when tolerating food  Asthma: asymptomatic, controlled - Continue home Dulera and albuterol  HFpEF: Last ECHO 05/2014: EF 65-70%, G2DD. On Coreg 25mg  BID, Lasix 20mg  qd, Lisinopril 40mg  daily, Spironolactone 25mg  daily at home. - Amlodipine 5 mg BID and Lisinopril 40 mg daily - Holding home carvedilol, spironolactone, HCTZ, and Lasix for now  HLD: On atorvastatin at  home. 10 year ASCVD risk score is 82%. Last lipid panel (04/2015): Chol 139, TG 187, HDL 37, LDL 65. Improved since 04/2015. - Restarting Atorvastatin 40 mg daily  Depression/anxiety, stable: On Seroquel 100mg  qhs, Reglan 10mg  TID at home. - Hold home meds while NPO - Ativan 0.5mg  qHS PRN  FEN/GI: clear liquid diet, advance as tolerated, D5 1/2NS @150cc /hr Prophylaxis: Lovenox  Disposition: pending GI recs, s/p biopsy pending from EGD on 9/27  Subjective:  Afebrile and stable overnight. Patient appeared anxious during evaluation due to scheduled EGD. Says epigastric and RUQ pain has improved but continues to be nauseous.   Objective: Temp:  [98.4 F (36.9 C)-98.8 F (37.1 C)] 98.4 F (36.9 C) (09/27 0458) Pulse Rate:  [80-94] 94 (09/27 0458) Resp:  [16-18] 18 (09/27 0458) BP: (131-176)/(48-69) 153/48 (09/27 0458) SpO2:  [95 %-99 %] 97 % (09/27 0458) Physical Exam: General: obese, well developed, anxious due to scheduled EGD, non-toxic appearance HEENT: normocephalic, atraumatic, moist mucous membranes Neck: supple, non-tender without lymphadenopathy CV: regular rate and rhythm without murmurs, rubs, or gallops Lungs: clear to auscultation bilaterally with normal work of breathing Abdomen: soft, non-distended, RUQ and epigastric tenderness to palpation w/o rebound or guarding, no masses or organomegaly palpable, hypoactive bowel sounds Skin: warm, dry, no rashes or lesions, cap refill < 2 seconds, no jaundice Extremities: warm and well perfused, normal tone  Laboratory:  Recent Labs Lab 01/14/16 1657 01/15/16 0540  WBC 6.7 5.2  HGB 14.3 13.2  HCT 43.3 41.6  PLT PLATELET CLUMPS NOTED ON SMEAR, UNABLE TO ESTIMATE 149*    Recent Labs Lab 01/14/16 1657 01/15/16 0540  NA 141 138  K 3.9 3.2*  CL 104 105  CO2 25 24  BUN 5* <5*  CREATININE 0.67 0.70  CALCIUM 9.0 8.6*  PROT 7.6 6.2*  BILITOT 0.5 0.5  ALKPHOS 66 71  ALT 15 20  AST 20 23  GLUCOSE 96 177*    Troponin:  0.04.0.05>0.05 Lipase:  39 UA:  pending  Imaging/Diagnostic Tests: CT Abdomen Pelvis W Contrast (01/15/2016) FINDINGS: Lower chest: Scar versus subsegmental atelectasis noted within the right middle lobe and lingula.  Hepatobiliary: Hepatic steatosis noted. Previous cholecystectomy. Mild increase caliber of the common bile duct which measures up to 9 mm. This appears increased from 7 mm previously. No focal liver abnormality.  Pancreas: Mild stranding around the head of pancreas appears new from previous exam. No pancreas mass identified.  Spleen: The spleen is negative.  Adrenals/Urinary Tract: Normal appearance of the adrenal glands. There is a low density structure arising from the inferior pole the left kidney which measures 8 mm. This is too small to reliably characterize. No mass or hydronephrosis identified.  Stomach/Bowel: The stomach is normal. The small bowel loops have a normal course and caliber. The appendix is visualized and appears normal. Unremarkable appearance of the colon.  Vascular/Lymphatic: Calcified atherosclerotic disease involves the abdominal aorta. No aneurysm. No enlarged retroperitoneal or mesenteric adenopathy. No enlarged pelvic or inguinal lymph nodes.  Reproductive: Status post hysterectomy. No adnexal masses.  Other: No abdominal wall hernia or abnormality. No abdominopelvic ascites.  Musculoskeletal: There is no degenerative disc disease noted within the lumbar spine.  IMPRESSION: 1. There is mild edema surrounding the head of pancreas which in light of recently elevated lipase may reflect resolving, uncomplicated pancreatitis. 2. The common bowel the is mildly increased in diameter when compared with 05/22/2015. This may be secondary to recent pancreatitis. If there is a concern for choledocholithiasis consider further  investigation with MRCP. 3. Aortic atherosclerosis  EGD (01/16/2016) IMPRESSION: -  Z-line irregular, 38 cm from the incisors. Biopsied. - Bilious gastric fluid. - Gastritis. Biopsied. - Duodenitis. - Normal first portion of the duodenum and second portion of the duodenum.    [place], DO 01/16/2016, 7:20 AM PGY-1, [place] Intern pager: [phone], text pages welcome

## 2016-01-16 NOTE — Progress Notes (Addendum)
[facility] Progress Note  [name] [name] 65 y.o. [birth year]  CC:  Dysphagia, abdominal pain   Subjective: Patient is complaining of epigastric pain radiating towards substernal area. Comparing of nausea. Denied vomiting. No bowel movement today. Denied shortness of breath  ROS : Epigastric pain. Denied vomiting. Complaining of nausea   Objective: Vital signs in last 24 hours: Vitals:   01/16/16 0458 01/16/16 0955  BP: (!) 153/48 (!) 207/82  Pulse: 94 85  Resp: 18 13  Temp: 98.4 F (36.9 C)     Physical Exam: General:   Alert,  Anxious well-nourished,  and cooperative in NAD Oral mucosa moist Lungs:  Clear throughout to auscultation.   No wheezes, crackles, or rhonchi. No acute distress. Heart:  Regular rate and rhythm; no murmurs, clicks, rubs,  or gallops. Abdomen: Epigastric tenderness to palpation, soft, nondistended, bowel sounds present LE : No edema    Lab Results:  Recent Labs  01/15/16 0540 01/16/16 0458  NA 138 139  K 3.2* 3.6  CL 105 104  CO2 24 23  GLUCOSE 177* 230*  BUN <5* <5*  CREATININE 0.70 0.72  CALCIUM 8.6* 8.8*    Recent Labs  01/15/16 0540 01/16/16 0458  AST 23 18  ALT 20 17  ALKPHOS 71 69  BILITOT 0.5 0.5  PROT 6.2* 6.9  ALBUMIN 3.5 3.5    Recent Labs  01/15/16 0540 01/16/16 0458  WBC 5.2 5.5  HGB 13.2 13.4  HCT 41.6 42.2  MCV 86.1 87.6  PLT 149* 148*   No results for input(s): LABPROT, INR in the last 72 hours.    Assessment/Plan: Epigastric pain which is worse only with solid food not associated with liquid food. ? Etiology. Need to rule out lower esophageal stricture/ ulcer/esophagitis Previous multiple CT scan has shown thickening at the lower esophagus. ?? Pancreatitis. Lipase was only 73 around a week ago which is less than 3 times upper limit of normal. Lipase is normal now.  Patient's description of pain is not typical for pancreatitis Dilated CBD of 9 mm. Normal LFTs. Normal  CBC Constipation Diabetes  Recommendations --------------------------- - EGD today  for further evaluation with possible dilatation.  Risks benefits and alternatives discussed with the patient, sister and son. Risk of perforation also discussed. Verbalized understanding - X-ray orbit negative. We will order an MRCP for evaluation of dilated CBD  - GI will follow   [name] [name] is a 65 y.o. female has presented with epigastric pain and dysphagia  The various methods of treatment have been discussed with the patient and family. After consideration of risks, benefits and other options for treatment, the patient has consented to  Procedure(s): EGD as a surgical intervention .  The patient's history has been reviewed, patient examined, no change in status, stable for surgery.  I have reviewed the patient's chart and labs.  Questions were answered to the patient's satisfaction.    [name] MD, [place] 01/16/2016, 10:36 AM  Pager [phone]  If no answer or after 5 PM call [phone]

## 2016-01-16 NOTE — Progress Notes (Signed)
Pt very anxious this am and feels sob. Paged teaching service resident. No new orders at this time. Resident encouraged to reassure pt. 1L O2 placed for comfort. Will re-assess pt.

## 2016-01-16 NOTE — Anesthesia Preprocedure Evaluation (Addendum)
Anesthesia Evaluation  Patient identified by MRN, date of birth, ID band Patient awake    Reviewed: Allergy & Precautions, H&P , NPO status , Patient's Chart, lab work & pertinent test results, reviewed documented beta blocker date and time   Airway Mallampati: I  TM Distance: >3 FB Neck ROM: full    Dental  (+) Edentulous Upper, Dental Advisory Given, Partial Lower   Pulmonary asthma , Current Smoker,     + decreased breath sounds      Cardiovascular Exercise Tolerance: Poor hypertension, Pt. on medications + angina with exertion + CAD   Rhythm:regular Rate:Normal     Neuro/Psych  Headaches, Anxiety Depression  Neuromuscular disease CVA, No Residual Symptoms    GI/Hepatic GERD  ,  Endo/Other  diabetes, Poorly Controlled, Type 2, Insulin Dependent  Renal/GU      Musculoskeletal  (+) Arthritis , Fibromyalgia -  Abdominal   Peds  Hematology   Anesthesia Other Findings   Reproductive/Obstetrics                            Anesthesia Physical  Anesthesia Plan  ASA: III  Anesthesia Plan: MAC   Post-op Pain Management:    Induction: Intravenous  Airway Management Planned: Simple Face Mask  Additional Equipment:   Intra-op Plan:   Post-operative Plan:   Informed Consent: I have reviewed the patients History and Physical, chart, labs and discussed the procedure including the risks, benefits and alternatives for the proposed anesthesia with the patient or authorized representative who has indicated his/her understanding and acceptance.     Plan Discussed with: CRNA, Anesthesiologist and Surgeon  Anesthesia Plan Comments:         Anesthesia Quick Evaluation

## 2016-01-16 NOTE — Op Note (Signed)
[facility] Patient Name: [name] [name] Procedure Date : 01/16/2016 MRN: [id number] Attending MD: [name] , MD Date of Birth: 05/09/1950 CSN: [id number] Age: 65 Admit Type: Inpatient Procedure:                Upper GI endoscopy Indications:              Epigastric abdominal pain, Dysphagia Providers:                [name], MD, Zenon Mayo, RN, William Dalton, Technician Referring MD:             Dionne Bucy. Bacigalupo Medicines:                See the Anesthesia note for documentation of the                            administered medications Complications:            No immediate complications. Estimated Blood Loss:     Estimated blood loss: none. Estimated blood loss                            was minimal. Procedure:                Pre-Anesthesia Assessment:                           - Prior to the procedure, a History and Physical                            was performed, and patient medications and                            allergies were reviewed. The patient's tolerance of                            previous anesthesia was also reviewed. The risks                            and benefits of the procedure and the sedation                            options and risks were discussed with the patient.                            All questions were answered, and informed consent                            was obtained. Prior Anticoagulants: The patient has                            taken no previous anticoagulant or antiplatelet  agents. ASA Grade Assessment: III - A patient with                            severe systemic disease. After reviewing the risks                            and benefits, the patient was deemed in                            satisfactory condition to undergo the procedure.                           After obtaining informed consent, the endoscope was                             passed under direct vision. Throughout the                            procedure, the patient's blood pressure, pulse, and                            oxygen saturations were monitored continuously. The                            [id number]) scope was introduced through the                            mouth, and advanced to the second part of duodenum.                            The upper GI endoscopy was accomplished with ease.                            The patient tolerated the procedure well. Scope In: Scope Out: Findings:      whitish exudate noted coating middle and distal esophagus.biopsy was       taken      The Z-line was irregular and was found 38 cm from the incisors. Biopsies       were taken with a cold forceps for histology.      Bilious fluid was found in the gastric body. which was suctioned. No       gastric ulcer.      The cardia and gastric fundus were normal on retroflexion.      Diffuse mild inflammation characterized by congestion (edema) and       friability was found in the prepyloric region of the stomach. Biopsies       were taken with a cold forceps for Helicobacter pylori testing.      Diffuse moderate inflammation characterized by congestion (edema),       erythema, friability and granularity was found in the duodenal bulb.      The first portion of the duodenum and second portion of the duodenum       were normal. Impression:               - Z-line irregular, 38 cm from the  incisors.                            Biopsied.                           - Bilious gastric fluid.                           - Gastritis. Biopsied.                           - Duodenitis.                           - Normal first portion of the duodenum and second                            portion of the duodenum. Moderate Sedation:      Moderate (conscious) sedation was personally administered by an       anesthesia professional. The following parameters were monitored: oxygen        saturation, heart rate, blood pressure, and response to care. Recommendation:           - Await pathology results.                           - Return patient to hospital ward for ongoing care.                           - Resume previous diet.                           - Continue present medications.                           - Await pathology results. Procedure Code(s):        --- Professional ---                           [phone], Esophagogastroduodenoscopy, flexible,                            transoral; with biopsy, single or multiple Diagnosis Code(s):        --- Professional ---                           K22.8, Other specified diseases of esophagus                           K29.70, Gastritis, unspecified, without bleeding                           K29.80, Duodenitis without bleeding                           R10.13, Epigastric pain  R13.10, Dysphagia, unspecified CPT copyright 2016 American Medical Association. All rights reserved. The codes documented in this report are preliminary and upon coder review may  be revised to meet current compliance requirements. [name], MD [name], MD 01/16/2016 11:12:25 AM Number of Addenda: 0

## 2016-01-16 NOTE — Transfer of Care (Signed)
Immediate Anesthesia Transfer of Care Note  Patient: [name] [name]  Procedure(s) Performed: Procedure(s): ESOPHAGOGASTRODUODENOSCOPY (EGD) with possible dilatation (N/A)  Patient Location: Endoscopy Unit  Anesthesia Type:MAC  Level of Consciousness: awake, oriented and patient cooperative  Airway & Oxygen Therapy: Patient Spontanous Breathing and Patient connected to nasal cannula oxygen  Post-op Assessment: Report given to RN and Post -op Vital signs reviewed and stable  Post vital signs: Reviewed  Last Vitals:  Vitals:   01/16/16 0955 01/16/16 1115  BP: (!) 207/82 (!) 189/64  Pulse: 85 86  Resp: 13 (!) 26  Temp:      Last Pain:  Vitals:   01/16/16 0955  TempSrc: Oral  PainSc: 9       Patients Stated Pain Goal: 2 ([id number])  Complications: No apparent anesthesia complications

## 2016-01-16 NOTE — Anesthesia Procedure Notes (Signed)
Procedure Name: MAC Date/Time: 01/16/2016 10:45 AM Performed by: Jenne Campus Pre-anesthesia Checklist: Patient identified, Emergency Drugs available, Suction available, Patient being monitored and Timeout performed Patient Re-evaluated:Patient Re-evaluated prior to inductionOxygen Delivery Method: Nasal cannula

## 2016-01-16 NOTE — Anesthesia Postprocedure Evaluation (Signed)
Anesthesia Post Note  Patient: [name] [name]  Procedure(s) Performed: Procedure(s) (LRB): ESOPHAGOGASTRODUODENOSCOPY (EGD) with possible dilatation (N/A)  Patient location during evaluation: PACU Anesthesia Type: MAC Level of consciousness: awake and alert Pain management: pain level controlled Vital Signs Assessment: post-procedure vital signs reviewed and stable Respiratory status: spontaneous breathing, nonlabored ventilation, respiratory function stable and patient connected to nasal cannula oxygen Cardiovascular status: stable and blood pressure returned to baseline Anesthetic complications: no    Last Vitals:  Vitals:   01/16/16 0955 01/16/16 1115  BP: (!) 207/82 (!) 189/64  Pulse: 85 86  Resp: 13 (!) 26  Temp:      Last Pain:  Vitals:   01/16/16 0955  TempSrc: Oral  PainSc: 9                  Zenaida Deed

## 2016-01-17 ENCOUNTER — Inpatient Hospital Stay (HOSPITAL_COMMUNITY): Payer: Medicare Other

## 2016-01-17 ENCOUNTER — Encounter (HOSPITAL_COMMUNITY): Payer: Self-pay | Admitting: Gastroenterology

## 2016-01-17 DIAGNOSIS — I5030 Unspecified diastolic (congestive) heart failure: Secondary | ICD-10-CM

## 2016-01-17 DIAGNOSIS — E118 Type 2 diabetes mellitus with unspecified complications: Secondary | ICD-10-CM

## 2016-01-17 LAB — COMPREHENSIVE METABOLIC PANEL
ALT: 19 U/L (ref 14–54)
AST: 19 U/L (ref 15–41)
Albumin: 3.8 g/dL (ref 3.5–5.0)
Alkaline Phosphatase: 70 U/L (ref 38–126)
Anion gap: 10 (ref 5–15)
BUN: <5 mg/dL — ABNORMAL LOW (ref 6–20)
CO2: 26 mmol/L (ref 22–32)
Calcium: 9.1 mg/dL (ref 8.9–10.3)
Chloride: 104 mmol/L (ref 101–111)
Creatinine, Ser: 0.7 mg/dL (ref 0.44–1.00)
GFR calc Af Amer: >60 mL/min (ref >60)
GFR calc non Af Amer: >60 mL/min (ref >60)
Glucose, Bld: 161 mg/dL — ABNORMAL HIGH (ref 65–99)
Potassium: 3.4 mmol/L — ABNORMAL LOW (ref 3.5–5.1)
Sodium: 140 mmol/L (ref 135–145)
Total Bilirubin: 1 mg/dL (ref 0.3–1.2)
Total Protein: 7.2 g/dL (ref 6.5–8.1)

## 2016-01-17 LAB — GLUCOSE, CAPILLARY
Glucose-Capillary: 173 mg/dL — ABNORMAL HIGH (ref 65–99)
Glucose-Capillary: 178 mg/dL — ABNORMAL HIGH (ref 65–99)
Glucose-Capillary: 166 mg/dL — ABNORMAL HIGH (ref 65–99)
Glucose-Capillary: 198 mg/dL — ABNORMAL HIGH (ref 65–99)

## 2016-01-17 LAB — CBC
HCT: 43.6 % (ref 36.0–46.0)
Hemoglobin: 13.8 g/dL (ref 12.0–15.0)
MCH: 27.4 pg (ref 26.0–34.0)
MCHC: 31.7 g/dL (ref 30.0–36.0)
MCV: 86.7 fL (ref 78.0–100.0)
Platelets: 161 10*3/uL (ref 150–400)
RBC: 5.03 MIL/uL (ref 3.87–5.11)
RDW: 13.6 % (ref 11.5–15.5)
WBC: 6.4 10*3/uL (ref 4.0–10.5)

## 2016-01-17 MED ORDER — PANTOPRAZOLE SODIUM 40 MG PO TBEC
40.0000 mg | DELAYED_RELEASE_TABLET | Freq: Every day | ORAL | Status: DC
  Administered 2016-01-17 – 2016-01-18 (×2): 40 mg via ORAL
  Filled 2016-01-17 (×2): qty 1

## 2016-01-17 NOTE — Progress Notes (Signed)
Family Medicine Teaching Service Daily Progress Note Intern Pager: [phone]  Patient name: [name] [name] Medical record number: [id number] Date of birth: [birth year] Age: 65 y.o. Gender: female  Primary Care Provider: Lavon Paganini, MD Consultants: Gastroenterology Code Status: FULL  Pt Overview and Major Events to Date:  9/25:  Admit for abdominal pain, dehydration, and decreased PO intake 2/2 acute on chronic pancreatitis, CT consistent with resolving uncomplicated pancreatitis but did show increased dilation of common bile duct 9/26:  GI consulted due to concerns for choledocholithiasis on CT, plan to EGD on 9/27 for concerns of esophageal stricture/ulcer/esophagitis due to thickened lower esophagus on CT 9/27:  EGD showed Z-line irregularity, biopsied, gastritis, biopsied, and duodenitis, pending biopsy results 9/28:  MRCP by GI, results pending  Assessment and Plan: [name] [name] is a 65 y.o. female presenting with abdominal pain, likely due to acute on chronic pancreatitis. PMH is significant for HTN, HLD, Type II DM with peripheral neuropathy, asthma, HFpEF, depression/anxiety.  #Abdominal pain likely 2/2 acute on chronic pancreatitis: Failed outpatient management over past two weeks. Lipase two weeks ago 145, improved to 73 one week ago, and normalized during admission to 39. Now with persistent severe epigastric pain and difficulty tolerating PO, leading to mild dehydration on clinical exam. Continue to provide IVF and bowel rest. CT of abdomen was consistent with resolving uncomplicated pancreatitis. There was noted to be an increase dilation of the common bile duct since 04/2015. S/p cholecystectomy. Recommendation was to consider evaluation for choledocholithiasis. Examination is consistent with epigastric to RUQ tenderness w/o signs of acute abdomen. Patient notes pain in LUQ and LLQ at hernia sites which does not seem to be related to pancreatitis as she has experienced this  pain in the past. Patient remains afebrile w/o jaundice or leukocytosis. GI has been consulted for evaluation concerning this common duct dilation in the setting on acute on chronic pancreatitis. They were concerned for stricture/ulcer/esophagitis based upon thickened distal esophagus on CT. EGD showed Z-line irregularity, biopsied, gastritis, biopsied, and duodenitis, pending biopsy results. - D5 1/2NS @150cc /hr - Continue trending CBC, CMP - GI consulted, appreciate recommendations - F/u GI recs pending MRCP and biopsies from EGD - Dilaudid PRN pain - IV Zofran PRN nausea  #Chest pain: Likely 2/2 pancreatitis, however obtained trop to rule out cardiac etiology which showed slight bump to 0.04>0.05>0.05. EKG obtained in clinic prior to admission unchanged from prior. This was curbsided with Dr. Percival Spanish and he and he also agreed this is less likely cardiac related and nor further cardiac evaluation is warranted at this time.  #HTN: On lisinopril, amlodipine, carvedilol, spironolactone, HCTZ, and Lasix at home. BP 206/81 in clinic prior to admission. Remains at 150s/50s. - IV hydralazine 10mg  BID, however patient may need higher dose to control reportedly persistently elevated BP despite multiple medications - Amlodipine 5 mg BID and Lisinopril 40 mg daily - Holding home carvedilol, spironolactone, HCTZ, and Lasix for now  #T2DM: Lantus 40U BID and Humalog 15U with meals at home. Last HgbA1c 9.1 (12/31/15). Also taking Lyrica for diabetic neuropathy. Levels around mid to high 100s. - Lantus 20U QHS - Sensitive SSI - CBG qACHS - Holding home Lyrica for now, can restart when tolerating food  #Asthma: asymptomatic, controlled - Continue home Dulera and albuterol  #HFpEF: Last ECHO 05/2014: EF 65-70%, G2DD. On Coreg 25mg  BID, Lasix 20mg  qd, Lisinopril 40mg  daily, Spironolactone 25mg  daily at home. - Amlodipine 5 mg BID and Lisinopril 40 mg daily - Holding home carvedilol, spironolactone,  HCTZ,  and Lasix for now  #HLD: On atorvastatin at home. 10 year ASCVD risk score is 82%. Last lipid panel (04/2015): Chol 139, TG 187, HDL 37, LDL 65. Improved since 04/2015. - Atorvastatin 40 mg daily  #Depression/anxiety, stable: On Seroquel 100mg  qhs, Reglan 10mg  TID at home. - Hold home meds while NPO - Ativan 0.5mg  qHS PRN  FEN/GI: clear liquid diet, advance as tolerated, D5 1/2NS @150cc /hr Prophylaxis: Lovenox  Disposition: pending GI recs, s/p biopsy pending from EGD on 9/27  Subjective:  Afebrile and stable overnight. Says epigastric and RUQ pain has improved but continues to be nauseous.   Objective: Temp:  [98.8 F (37.1 C)-99.7 F (37.6 C)] 98.9 F (37.2 C) (09/28 0506) Pulse Rate:  [83-102] 102 (09/28 0506) Resp:  [19-26] 19 (09/27 1545) BP: (162-203)/(64-96) 169/78 (09/28 0506) SpO2:  [93 %-100 %] 93 % (09/28 0506) Physical Exam: General: obese, well developed, no acute distress with non-toxic appearance HEENT: normocephalic, atraumatic, moist mucous membranes Neck: supple, non-tender without lymphadenopathy CV: regular rate and rhythm without murmurs, rubs, or gallops Lungs: clear to auscultation bilaterally with normal work of breathing Abdomen: soft, non-distended, RUQ and epigastric tenderness to palpation w/o rebound or guarding, no masses or organomegaly palpable, hypoactive bowel sounds Skin: warm, dry, no rashes or lesions, cap refill < 2 seconds, no jaundice Extremities: warm and well perfused, normal tone  Laboratory:  Recent Labs Lab 01/15/16 0540 01/16/16 0458 01/17/16 0419  WBC 5.2 5.5 6.4  HGB 13.2 13.4 13.8  HCT 41.6 42.2 43.6  PLT 149* 148* 161    Recent Labs Lab 01/15/16 0540 01/16/16 0458 01/17/16 0419  NA 138 139 140  K 3.2* 3.6 3.4*  CL 105 104 104  CO2 24 23 26   BUN <5* <5* <5*  CREATININE 0.70 0.72 0.70  CALCIUM 8.6* 8.8* 9.1  PROT 6.2* 6.9 7.2  BILITOT 0.5 0.5 1.0  ALKPHOS 71 69 70  ALT 20 17 19   AST 23 18 19    GLUCOSE 177* 230* 161*   Troponin:  0.04.0.05>0.05 Lipase:  39 UA:  Neg  Imaging/Diagnostic Tests: CT Abdomen Pelvis W Contrast (01/15/2016) FINDINGS: Lower chest: Scar versus subsegmental atelectasis noted within the right middle lobe and lingula.  Hepatobiliary: Hepatic steatosis noted. Previous cholecystectomy. Mild increase caliber of the common bile duct which measures up to 9 mm. This appears increased from 7 mm previously. No focal liver abnormality.  Pancreas: Mild stranding around the head of pancreas appears new from previous exam. No pancreas mass identified.  Spleen: The spleen is negative.  Adrenals/Urinary Tract: Normal appearance of the adrenal glands. There is a low density structure arising from the inferior pole the left kidney which measures 8 mm. This is too small to reliably characterize. No mass or hydronephrosis identified.  Stomach/Bowel: The stomach is normal. The small bowel loops have a normal course and caliber. The appendix is visualized and appears normal. Unremarkable appearance of the colon.  Vascular/Lymphatic: Calcified atherosclerotic disease involves the abdominal aorta. No aneurysm. No enlarged retroperitoneal or mesenteric adenopathy. No enlarged pelvic or inguinal lymph nodes.  Reproductive: Status post hysterectomy. No adnexal masses.  Other: No abdominal wall hernia or abnormality. No abdominopelvic ascites.  Musculoskeletal: There is no degenerative disc disease noted within the lumbar spine.  IMPRESSION: 1. There is mild edema surrounding the head of pancreas which in light of recently elevated lipase may reflect resolving, uncomplicated pancreatitis. 2. The common bowel the is mildly increased in diameter when compared with 05/22/2015. This may be secondary  to recent pancreatitis. If there is a concern for choledocholithiasis consider further investigation with MRCP. 3. Aortic atherosclerosis  EGD  (01/16/2016) IMPRESSION: - Z-line irregular, 38 cm from the incisors. Biopsied. - Bilious gastric fluid. - Gastritis. Biopsied. - Duodenitis. - Normal first portion of the duodenum and second portion of the duodenum.    Edmunds Bing, DO 01/17/2016, 10:34 AM PGY-1, [place] Intern pager: [phone], text pages welcome

## 2016-01-17 NOTE — Care Management Important Message (Signed)
Important Message  Patient Details  Name: [name] [name] MRN: [id number] Date of Birth: 1951-03-01   Medicare Important Message Given:  Yes    Talik Casique Montine Circle 01/17/2016, 3:26 PM

## 2016-01-17 NOTE — Progress Notes (Signed)
[facility] Progress Note  [name] [name] 64 y.o. [birth year]  CC:  Dysphagia, abdominal pain   Subjective:  Abdominal pain is improving. Tolerating some food. C/O nausea. Denied vomiting.   ROS : Epigastric pain improving. . Denied vomiting. Complaining of nausea   Objective: Vital signs in last 24 hours: Vitals:   01/17/16 1500 01/17/16 1538  BP: (!) 158/57 (!) 131/57  Pulse: (!) 107 (!) 101  Resp:  19  Temp: 99 F (37.2 C) 99 F (37.2 C)    Physical Exam: General:   Alert,  Anxious well-nourished,  and cooperative in NAD Oral mucosa moist Lungs:  Clear throughout to auscultation.   No wheezes, crackles, or rhonchi. No acute distress. Heart:  Regular rate and rhythm; no murmurs, clicks, rubs,  or gallops. Abdomen: NT, soft, nondistended, bowel sounds present LE : No edema    Lab Results:  Recent Labs  01/16/16 0458 01/17/16 0419  NA 139 140  K 3.6 3.4*  CL 104 104  CO2 23 26  GLUCOSE 230* 161*  BUN <5* <5*  CREATININE 0.72 0.70  CALCIUM 8.8* 9.1    Recent Labs  01/16/16 0458 01/17/16 0419  AST 18 19  ALT 17 19  ALKPHOS 69 70  BILITOT 0.5 1.0  PROT 6.9 7.2  ALBUMIN 3.5 3.8    Recent Labs  01/16/16 0458 01/17/16 0419  WBC 5.5 6.4  HGB 13.4 13.8  HCT 42.2 43.6  MCV 87.6 86.7  PLT 148* 161   No results for input(s): LABPROT, INR in the last 72 hours.    Assessment/Plan: Epigastric pain . ? From pancreatitis/Gastritis. Improving.  Gastroparesis. Confirmed by Gastric emptying in 2012.  Dysphagia. Previous multiple CT scan has shown thickening at the lower esophagus.Egd showed no obstruction.   Mild Gastritis and Duodenitis based on EGD.  Biopsy pending.  ?? Pancreatitis. Lipase was only 73 around a week ago which is less than 3 times upper limit of normal. Lipase is normal now.  Dilated CBD of 9 mm. Normal LFTs. Normal CBC - Diabetes  Recommendations --------------------------- - Follow Biopsy results.  - MRCP negative.   - Advance diet to low fat . - Recommend D/C Reglan (not recommended for long term use) - Start Once a day PPI  - GI will follow    [name] MD, FACP 01/17/2016, 4:22 PM  Pager [phone]  If no answer or after 5 PM call [phone]

## 2016-01-17 NOTE — Progress Notes (Signed)
Transitions of Care Pharmacy Note  Educated on new medications and importance of reading discharge medication list upon discharge. Discussed the following:  - Erythromycin - atorvastatin drug interaction: increased risk of myopathies. When discussing with pt, she stated she has been having "some leg aches" starting at home but has not told anyone about them until now. Recommended she discuss further with her provider. Could consider switching statin to rosuvastatin 20mg , which may lessen myopathy risk and avoid drug interaction if concerned for myopathy.  - Erythromycin - PTA quetiapine drug interaction: increased risk of QTc prolongation (was < 500 while inpatient). - New PPI - will likely not need PTA ranitidine if PPI continued outpatient  [name] [name] is an 65 y.o. female who presents with a chief complaint of abdominal pain. In anticipation of discharge, pharmacy has reviewed this patient's prior to admission medication history, as well as current inpatient medications listed per the [facility].  Current medication indications, dosing, frequency, and notable side effects reviewed with patient. patient verbalized understanding of current inpatient medication regimen and is aware that the After Visit Summary when presented, will represent the most accurate medication list at discharge.   Assessment: Understanding of regimen: fair Understanding of indications: fair Potential of compliance: fair Barriers to Obtaining Medications: No, but stated that her Lyrica and Lantus are   Patient instructed to contact inpatient pharmacy team with further questions or concerns if needed.    Time spent preparing for discharge counseling: 15 minutes Time spent counseling patient: 15 minutes   Thank you for allowing pharmacy to be a part of this patient's care.  Demetrius Charity, PharmD Acute Care Pharmacy Resident  Pager: [phone] 01/17/2016

## 2016-01-18 DIAGNOSIS — B3781 Candidal esophagitis: Secondary | ICD-10-CM

## 2016-01-18 LAB — CBC
HCT: 43.4 % (ref 36.0–46.0)
Hemoglobin: 14 g/dL (ref 12.0–15.0)
MCH: 27.8 pg (ref 26.0–34.0)
MCHC: 32.3 g/dL (ref 30.0–36.0)
MCV: 86.3 fL (ref 78.0–100.0)
Platelets: 144 10*3/uL — ABNORMAL LOW (ref 150–400)
RBC: 5.03 MIL/uL (ref 3.87–5.11)
RDW: 13.6 % (ref 11.5–15.5)
WBC: 5.9 10*3/uL (ref 4.0–10.5)

## 2016-01-18 LAB — COMPREHENSIVE METABOLIC PANEL
ALT: 18 U/L (ref 14–54)
AST: 16 U/L (ref 15–41)
Albumin: 3.6 g/dL (ref 3.5–5.0)
Alkaline Phosphatase: 73 U/L (ref 38–126)
Anion gap: 7 (ref 5–15)
BUN: <5 mg/dL — ABNORMAL LOW (ref 6–20)
CO2: 27 mmol/L (ref 22–32)
Calcium: 9.1 mg/dL (ref 8.9–10.3)
Chloride: 106 mmol/L (ref 101–111)
Creatinine, Ser: 0.87 mg/dL (ref 0.44–1.00)
GFR calc Af Amer: >60 mL/min (ref >60)
GFR calc non Af Amer: >60 mL/min (ref >60)
Glucose, Bld: 175 mg/dL — ABNORMAL HIGH (ref 65–99)
Potassium: 3.6 mmol/L (ref 3.5–5.1)
Sodium: 140 mmol/L (ref 135–145)
Total Bilirubin: 1.1 mg/dL (ref 0.3–1.2)
Total Protein: 6.7 g/dL (ref 6.5–8.1)

## 2016-01-18 LAB — GLUCOSE, CAPILLARY
Glucose-Capillary: 175 mg/dL — ABNORMAL HIGH (ref 65–99)
Glucose-Capillary: 194 mg/dL — ABNORMAL HIGH (ref 65–99)
Glucose-Capillary: 214 mg/dL — ABNORMAL HIGH (ref 65–99)

## 2016-01-18 MED ORDER — ROSUVASTATIN CALCIUM 20 MG PO TABS
20.0000 mg | ORAL_TABLET | Freq: Every day | ORAL | Status: DC
Start: 2016-01-18 — End: 2016-01-18
  Administered 2016-01-18: 20 mg via ORAL
  Filled 2016-01-18: qty 1

## 2016-01-18 MED ORDER — ROSUVASTATIN CALCIUM 20 MG PO TABS: 20.0000 mg | ORAL_TABLET | Freq: Every day | ORAL | 0 refills | Status: DC

## 2016-01-18 MED ORDER — FLUCONAZOLE 100 MG PO TABS: 200.0000 mg | ORAL_TABLET | Freq: Every day | ORAL | 0 refills | Status: DC

## 2016-01-18 MED ORDER — OXYCODONE HCL 5 MG PO TABS: 5.0000 mg | ORAL_TABLET | Freq: Four times a day (QID) | ORAL | 0 refills | Status: DC | PRN

## 2016-01-18 MED ORDER — FLUCONAZOLE 100 MG PO TABS: 100.0000 mg | ORAL_TABLET | Freq: Every day | ORAL | Status: DC

## 2016-01-18 MED ORDER — PANTOPRAZOLE SODIUM 40 MG PO TBEC: 40.0000 mg | DELAYED_RELEASE_TABLET | Freq: Every day | ORAL | 0 refills | Status: DC

## 2016-01-18 MED ORDER — FLUCONAZOLE 100 MG PO TABS: 100.0000 mg | ORAL_TABLET | Freq: Every day | ORAL | 0 refills | Status: DC

## 2016-01-18 MED ORDER — FLUCONAZOLE 100 MG PO TABS
100.0000 mg | ORAL_TABLET | Freq: Every day | ORAL | Status: DC
  Administered 2016-01-18: 100 mg via ORAL
  Filled 2016-01-18: qty 1

## 2016-01-18 MED ORDER — PROMETHAZINE HCL 25 MG PO TABS
12.5000 mg | ORAL_TABLET | Freq: Four times a day (QID) | ORAL | Status: DC | PRN
Start: 2016-01-18 — End: 2016-01-18
  Administered 2016-01-18: 12.5 mg via ORAL
  Filled 2016-01-18: qty 1

## 2016-01-18 NOTE — Progress Notes (Signed)
Family Medicine Teaching Service Daily Progress Note Intern Pager: [phone]  Patient name: [name] [name] Medical record number: [id number] Date of birth: 1950/05/29 Age: 65 y.o. Gender: female  Primary Care Provider: Lavon Paganini, MD Consultants: Gastroenterology Code Status: FULL  Pt Overview and Major Events to Date:  9/25:  Admit for abdominal pain, dehydration, and decreased PO intake 2/2 acute on chronic pancreatitis, CT consistent with resolving uncomplicated pancreatitis but did show increased dilation of common bile duct 9/26:  GI consulted due to concerns for choledocholithiasis on CT, plan to EGD on 9/27 for concerns of esophageal stricture/ulcer/esophagitis due to thickened lower esophagus on CT 9/27:  EGD showed Z-line irregularity, gastritis, and duodenitis, biopsied 9/28:  MRCP by GI, no signs of choledocolithiasis 9/29: Stomach biopsy shows chronic gastritis neg for H pylori, esophagus showed benign squamous mucosa w/o dysplasia or malignancy, esophagus shows signs of reflux w/o dysplasia or malignancy  Assessment and Plan: [name] [name] is a 65 y.o. female presenting with abdominal pain, likely due to acute on chronic pancreatitis. PMH is significant for HTN, HLD, Type II DM with peripheral neuropathy, asthma, HFpEF, depression/anxiety.  #Abdominal pain likely 2/2 acute on chronic pancreatitis: improved, failed outpatient management over past two weeks for pancreatitis. Lipase two weeks ago 145, improved to 73 one week ago, and normalized during admission to 39. Now with persistent severe epigastric pain and difficulty tolerating PO, leading to mild dehydration on clinical exam. Examination is consistent with epigastric to RUQ tenderness w/o signs of acute abdomen. Patient notes pain in LUQ and LLQ at hernia sites which does not seem to be related to pancreatitis as she has experienced this pain in the past. CT of abdomen was consistent with resolving uncomplicated  pancreatitis. There was noted to be an increase dilation of the common bile duct since 04/2015. S/p cholecystectomy. GI evaluated for choledocholithiasis by MRCP which was neg. There was concern for stricture/ulcer/esophagitis based upon thickened distal esophagus on CT. EGD showed Z-line irregularity, gastritis, and duodenitis, with unremarkable biopsy results. Patient remains afebrile w/o jaundice or leukocytosis. - Continue trending CBC, CMP - GI consulted, appreciate recommendations - Dilaudid PRN pain - IV Zofran PRN nausea - Awaiting advancement of diet and GI recs - Protonix 40 mg PO daily - Erythromycin 250 mg TID  #Chest pain: Likely 2/2 pancreatitis, however obtained trop to rule out cardiac etiology which showed slight bump to 0.04>0.05>0.05. EKG obtained in clinic prior to admission unchanged from prior. This was curbsided with Dr. Percival Spanish and he and he also agreed this is less likely cardiac related and nor further cardiac evaluation is warranted at this time.  #HTN: On lisinopril, amlodipine, carvedilol, spironolactone, HCTZ, and Lasix at home. BP 206/81 in clinic prior to admission. Remains at 150s/50s. - IV hydralazine 10mg  BID, however patient may need higher dose to control reportedly persistently elevated BP despite multiple medications - Amlodipine 5 mg BID and Lisinopril 40 mg daily - Holding home carvedilol, spironolactone, HCTZ, and Lasix for now  #T2DM: Lantus 40 U BID and Humalog 15 U with meals at home. Last HgbA1c 9.1 (12/31/15). Also taking Lyrica for diabetic neuropathy. Levels around mid to high 100s. - Lantus 20 U QHS - Sensitive SSI - CBG qACHS - Holding home Lyrica for now, can restart when tolerating food  #Asthma: asymptomatic, controlled - Continue home Dulera and albuterol  #HFpEF: Last ECHO 05/2014: EF 65-70%, G2DD. On Coreg 25mg  BID, Lasix 20mg  qd, Lisinopril 40mg  daily, Spironolactone 25mg  daily at home. - Amlodipine 5 mg BID  and Lisinopril 40 mg  daily - Holding home carvedilol, spironolactone, HCTZ, and Lasix for now  #HLD: On atorvastatin at home. 10 year ASCVD risk score is 82%. Last lipid panel (04/2015): Chol 139, TG 187, HDL 37, LDL 65. Improved since 04/2015. - Home atorvastatin 40 mg daily, will switch to rosuvastatin 20 mg while on erythromycin as this has less myopathy symptoms (pt has h/o myalgias), will need to make sure this is continued on discharge  #Depression/anxiety, stable: On Seroquel 100mg  qhs, Reglan 10mg  TID at home. - Ativan 0.5mg  qHS PRN  FEN/GI: advancing diet as tolerated Prophylaxis: Lovenox  Disposition: pending GI recs and advancing diet  Subjective:  Afebrile and stable overnight. Says she was able to eat jello last night. Continues to have some nausea but improved. Occasional epigastric pain but greatly improved since admission. Says she will try and advance her diet this morning. No new complaints. Says she is ready to go home.  Objective: Temp:  [98.9 F (37.2 C)-99.3 F (37.4 C)] 98.9 F (37.2 C) (09/29 0548) Pulse Rate:  [99-107] 99 (09/29 0548) Resp:  [17-19] 18 (09/29 0548) BP: (131-170)/(54-72) 142/54 (09/29 0548) SpO2:  [90 %-96 %] 91 % (09/29 0548) Physical Exam: General: obese, well developed, no acute distress with non-toxic appearance HEENT: normocephalic, atraumatic, moist mucous membranes Neck: supple, non-tender without lymphadenopathy CV: regular rate and rhythm without murmurs, rubs, or gallops Lungs: clear to auscultation bilaterally with normal work of breathing Abdomen: soft, non-distended, mild epigastric tenderness to palpation w/o rebound or guarding, no masses or organomegaly palpable, hypoactive bowel sounds Skin: warm, dry, no rashes or lesions, cap refill < 2 seconds, no jaundice Extremities: warm and well perfused, normal tone  Laboratory:  Recent Labs Lab 01/16/16 0458 01/17/16 0419 01/18/16 0526  WBC 5.5 6.4 5.9  HGB 13.4 13.8 14.0  HCT 42.2 43.6 43.4   PLT 148* 161 144*    Recent Labs Lab 01/16/16 0458 01/17/16 0419 01/18/16 0526  NA 139 140 140  K 3.6 3.4* 3.6  CL 104 104 106  CO2 23 26 27   BUN <5* <5* <5*  CREATININE 0.72 0.70 0.87  CALCIUM 8.8* 9.1 9.1  PROT 6.9 7.2 6.7  BILITOT 0.5 1.0 1.1  ALKPHOS 69 70 73  ALT 17 19 18   AST 18 19 16   GLUCOSE 230* 161* 175*   Troponin:  0.04.0.05>0.05 Lipase:  39 UA:  Neg  Imaging/Diagnostic Tests: CT Abdomen Pelvis W Contrast (01/15/2016) FINDINGS: Lower chest: Scar versus subsegmental atelectasis noted within the right middle lobe and lingula.  Hepatobiliary: Hepatic steatosis noted. Previous cholecystectomy. Mild increase caliber of the common bile duct which measures up to 9 mm. This appears increased from 7 mm previously. No focal liver abnormality.  Pancreas: Mild stranding around the head of pancreas appears new from previous exam. No pancreas mass identified.  Spleen: The spleen is negative.  Adrenals/Urinary Tract: Normal appearance of the adrenal glands. There is a low density structure arising from the inferior pole the left kidney which measures 8 mm. This is too small to reliably characterize. No mass or hydronephrosis identified.  Stomach/Bowel: The stomach is normal. The small bowel loops have a normal course and caliber. The appendix is visualized and appears normal. Unremarkable appearance of the colon.  Vascular/Lymphatic: Calcified atherosclerotic disease involves the abdominal aorta. No aneurysm. No enlarged retroperitoneal or mesenteric adenopathy. No enlarged pelvic or inguinal lymph nodes.  Reproductive: Status post hysterectomy. No adnexal masses.  Other: No abdominal wall hernia or abnormality. No  abdominopelvic ascites.  Musculoskeletal: There is no degenerative disc disease noted within the lumbar spine.  IMPRESSION: 1. There is mild edema surrounding the head of pancreas which in light of recently elevated lipase may  reflect resolving, uncomplicated pancreatitis. 2. The common bowel the is mildly increased in diameter when compared with 05/22/2015. This may be secondary to recent pancreatitis. If there is a concern for choledocholithiasis consider further investigation with MRCP. 3. Aortic atherosclerosis  EGD (01/16/2016) IMPRESSION: - Z-line irregular, 38 cm from the incisors. Biopsied. - Bilious gastric fluid. - Gastritis. Biopsied. - Duodenitis. - Normal first portion of the duodenum and second portion of the duodenum.  EGD Biopsies (01/16/2016) Diagnosis 1. Stomach, biopsy - CHRONIC INACTIVE GASTRITIS. - THERE IS NO EVIDENCE OF HELICOBACTER PYLORI, DYSPLASIA OR MALIGNANCY. - SEE COMMENT. 2. Esophagus, biopsy - BENIGN SQUAMOUS MUCOSA WITH CANDIDA ORGANISMS. - THERE IS NO EVIDENCE OF DYSPLASIA OR MALIGNANCY. - SEE COMMENT. 3. Esophagus, biopsy - GASTROESOPHAGEAL JUNCTION MUCOSA WITH MILD INFLAMMATION CONSISTENT WITH GASTROESOPHAGEAL REFLUX. - THERE IS NO EVIDENCED OF GOBLET CELL METAPLASIA DYSPLASIA OR MALIGNANCY IDENTIFIED.  MRCP (01/17/16) FINDINGS: Comment: The sensitivity this examination for detection of visceral and/or vascular lesions is limited by lack of IV gadolinium.  Lower chest: Mild cardiomegaly.  Hepatobiliary: Diffuse loss of signal intensity throughout the hepatic parenchyma on out of phase dual echo images, compatible with hepatic steatosis. MRCP images demonstrate no intra or extrahepatic biliary ductal dilatation. Common bile duct measures up to 6 mm in the porta hepatis. No filling defects within the common bile duct to suggest choledocholithiasis. Status post cholecystectomy.  Pancreas: No definite pancreatic mass identified on today's noncontrast examination. MRCP images demonstrate no pancreatic ductal dilatation. No definite pancreatic or peripancreatic fluid or inflammatory changes.  Spleen:  Unremarkable.  Adrenals/Urinary Tract: In the lower  pole of the left kidney there is an 11 mm lesion which is T1 isointense and T2 hyperintense, incompletely characterized on today's noncontrast examination, but likely to represent a small cyst. Right kidney and bilateral adrenal glands are normal in appearance. No hydroureteronephrosis in the visualized abdomen.  Stomach/Bowel: Visualized portions are generally unremarkable, although there is a short segment of the mid transverse colon, which slightly extends into a small epigastric ventral hernia.  Vascular/Lymphatic: No aneurysm identified in the visualized abdominal vasculature. No lymphadenopathy noted in the abdomen.  Other: No significant volume of ascites noted in the visualized portions of the peritoneal cavity.  Musculoskeletal: No aggressive osseous lesions are noted in the visualized portions of the skeleton.  IMPRESSION: 1. No intra or extrahepatic biliary ductal dilatation. No choledocholithiasis. The perceived mild dilatation of the common bile duct on the recent CT examination likely reflected benign post cholecystectomy physiology. 2. Hepatic steatosis. 3. Mild cardiomegaly. 4. Additional incidental findings, as above.    Verona Bing, DO 01/18/2016, 7:15 AM PGY-1, [place] Intern pager: [phone], text pages welcome

## 2016-01-18 NOTE — Discharge Instructions (Signed)
Ms. Langley, you were admitted for acute on chronic pancreatitis.  GI followed your care. CT of your belly showed resolved acute pancreatitis. MRCP was performed to make sure you did not have a stone in your bile duct. This was found to be normal. Endoscopy was performed showing fungal infection in your esophagus. You will continue your fluconazole 200 mg for 20 days.   Please call GI to schedule follow up appointment in 5 weeks and a repeat EGD in 6 weeks.  You will follow up on 10/4 at 9:00 AM with your PCP office.  Thrush, Adult Ritta Slot, also called oral candidiasis, is a fungal infection that develops in the mouth and throat and on the tongue. It causes white patches to form on the mouth and tongue. Ritta Slot is most common in older adults, but it can occur at any age.  Many cases of thrush are mild, but this infection can also be more serious. Ritta Slot can be a recurring problem for people who have chronic illnesses or who take medicines that limit the body's ability to fight infection. Because these people have difficulty fighting infections, the fungus that causes thrush can spread throughout the body. This can cause life-threatening blood or organ infections. CAUSES  Ritta Slot is usually caused by a yeast called Candida albicans. This fungus is normally present in small amounts in the mouth and on other mucous membranes. It usually causes no harm. However, when conditions are present that allow the fungus to grow uncontrolled, it invades surrounding tissues and becomes an infection. Less often, other Candida species can also lead to thrush.  RISK FACTORS Ritta Slot is more likely to develop in the following people:  People with an impaired ability to fight infection (weakened immune system).   Older adults.   People with HIV.   People with diabetes.   People with dry mouth (xerostomia).   Pregnant women.   People with poor dental care, especially those who have false teeth.   People  who use antibiotic medicines.  SIGNS AND SYMPTOMS  Ritta Slot can be a mild infection that causes no symptoms. If symptoms develop, they may include:   A burning feeling in the mouth and throat. This can occur at the start of a thrush infection.   White patches that adhere to the mouth and tongue. The tissue around the patches may be red, raw, and painful. If rubbed (during tooth brushing, for example), the patches and the tissue of the mouth may bleed easily.   A bad taste in the mouth or difficulty tasting foods.   Cottony feeling in the mouth.   Pain during eating and swallowing. DIAGNOSIS  Your health care provider can usually diagnose thrush by looking in your mouth and asking you questions about your health.  TREATMENT  Medicines that help prevent the growth of fungi (antifungals) are the standard treatment for thrush. These medicines are either applied directly to the affected area (topical) or swallowed (oral). The treatment will depend on the severity of the condition.  Mild Thrush Mild cases of thrush may clear up with the use of an antifungal mouth rinse or lozenges. Treatment usually lasts about 14 days.  Moderate to Severe Thrush  More severe thrush infections that have spread to the esophagus are treated with an oral antifungal medicine. A topical antifungal medicine may also be used.   For some severe infections, a treatment period longer than 14 days may be needed.   Oral antifungal medicines are almost never used during pregnancy because  the fetus may be harmed. However, if a pregnant woman has a rare, severe thrush infection that has spread to her blood, oral antifungal medicines may be used. In this case, the risk of harm to the mother and fetus from the severe thrush infection may be greater than the risk posed by the use of antifungal medicines.  Persistent or Recurrent Thrush For cases of thrush that do not go away or keep coming back, treatment may involve the  following:   Treatment may be needed twice as long as the symptoms last.   Treatment will include both oral and topical antifungal medicines.   People with weakened immune systems can take an antifungal medicine on a continuous basis to prevent thrush infections.  It is important to treat conditions that make you more likely to get thrush, such as diabetes or HIV.  HOME CARE INSTRUCTIONS   Only take over-the-counter or prescription medicine as directed by your health care provider. Talk to your health care provider about an over-the-counter medicine called gentian violet, which kills bacteria and fungi.   Eat plain, unflavored yogurt as directed by your health care provider. Check the label to make sure the yogurt contains live cultures. This yogurt can help healthy bacteria grow in the mouth that can stop the growth of the fungus that causes thrush.   Try these measures to help reduce the discomfort of thrush:   Drink cold liquids such as water or iced tea.   Try flavored ice treats or frozen juices.   Eat foods that are easy to swallow, such as gelatin, ice cream, or custard.   If the patches in your mouth are painful, try drinking from a straw.   Rinse your mouth several times a day with a warm saltwater rinse. You can make the saltwater mixture with 1 tsp (6 g) of salt in 8 fl oz (0.2 L) of warm water.   If you wear dentures, remove the dentures before going to bed, brush them vigorously, and soak them in a cleaning solution as directed by your health care provider.   Women who are breastfeeding should clean their nipples with an antifungal medicine as directed by their health care provider. Dry the nipples after breastfeeding. Applying lanolin-containing body lotion may help relieve nipple soreness.  SEEK MEDICAL CARE IF:  Your symptoms are getting worse or are not improving within 7 days of starting treatment.   You have symptoms of spreading infection, such as  white patches on the skin outside of the mouth.   You are nursing and you have redness, burning, or pain in the nipples that is not relieved with treatment.  MAKE SURE YOU:  Understand these instructions.  Will watch your condition.  Will get help right away if you are not doing well or get worse.   This information is not intended to replace advice given to you by your health care provider. Make sure you discuss any questions you have with your health care provider.   Document Released: 01/01/2004 Document Revised: 04/28/2014 Document Reviewed: 11/08/2012 Elsevier Interactive Patient Education [employer].

## 2016-01-18 NOTE — Progress Notes (Signed)
[facility] Progress Note  Jaleia Berenguer 65 y.o. [birth year]  CC:  Dysphagia, abdominal pain   Subjective:  Abdominal pain is improving. Complaining of burning and needle sensation in the both extremities. Stated that  she was taking Lyrica for neuropathy at home but it has been discontinued here. Complaining of nausea. Denied vomiting  ROS :  . Denied vomiting. Complaining of nausea   Objective: Vital signs in last 24 hours: Vitals:   01/18/16 0548 01/18/16 0827  BP: (!) 142/54   Pulse: 99 98  Resp: 18 18  Temp: 98.9 F (37.2 C)     Physical Exam: General:   Alert,  Anxious well-nourished,  and cooperative in NAD Oral mucosa moist Lungs:  Clear throughout to auscultation.   No wheezes, crackles, or rhonchi. No acute distress. Heart:  Regular rate and rhythm; no murmurs, clicks, rubs,  or gallops. Abdomen: NT, soft, nondistended, bowel sounds present LE : No edema    Lab Results:  Recent Labs  01/17/16 0419 01/18/16 0526  NA 140 140  K 3.4* 3.6  CL 104 106  CO2 26 27  GLUCOSE 161* 175*  BUN <5* <5*  CREATININE 0.70 0.87  CALCIUM 9.1 9.1    Recent Labs  01/17/16 0419 01/18/16 0526  AST 19 16  ALT 19 18  ALKPHOS 70 73  BILITOT 1.0 1.1  PROT 7.2 6.7  ALBUMIN 3.8 3.6    Recent Labs  01/17/16 0419 01/18/16 0526  WBC 6.4 5.9  HGB 13.8 14.0  HCT 43.6 43.4  MCV 86.7 86.3  PLT 161 144*   No results for input(s): LABPROT, INR in the last 72 hours.    Assessment/Plan: Lower Epigastric pain . ? From pancreatitis/Gastritis/ Candida esophagitis  Ongoing nausea and vomiting. ? Gastroparesis.  - Candida esophagitis. Confirmatory EGD/biopsy.  Gastroparesis. Confirmed by Gastric emptying in 2012.  Dysphagia. Previous multiple CT scan has shown thickening at the lower esophagus.Egd showed no obstruction.   Mild Gastritis and Duodenitis based on EGD.   ?? Pancreatitis. Lipase was only 73 around a week ago which is less than 3 times upper limit  of normal. Lipase is normal now.  Dilated CBD of 9 mm. Normal LFTs. Normal CBC Diabetes with neuropathy  Recommendations --------------------------- - Biopsy showed Candida esophagitis. We'll start the Fluconazole 100 mg once a day. Continue for 7-10 days. - MRCP negative.  - Advance diet as tolerated/  - Primary team  to reconsider starting Lyrica for diabetic neuropathy - Recommend D/C Reglan (not recommended for long term use) - GI will follow    [name] MD, FACP 01/18/2016, 10:51 AM  Pager [phone]  If no answer or after 5 PM call [phone]

## 2016-01-18 NOTE — Progress Notes (Signed)
Pt given discharge instructions, prescriptions, and care notes. Pt verbalized understanding AEB no further questions or concerns at this time. IV was discontinued, no redness, pain, or swelling noted at this time. Pt left the floor via wheelchair with staff in stable condition. 

## 2016-01-23 ENCOUNTER — Ambulatory Visit (INDEPENDENT_AMBULATORY_CARE_PROVIDER_SITE_OTHER): Payer: Medicare Other | Admitting: Family Medicine

## 2016-01-23 ENCOUNTER — Encounter: Payer: Self-pay | Admitting: Family Medicine

## 2016-01-23 DIAGNOSIS — G894 Chronic pain syndrome: Secondary | ICD-10-CM | POA: Diagnosis not present

## 2016-01-23 DIAGNOSIS — K3184 Gastroparesis: Secondary | ICD-10-CM

## 2016-01-23 DIAGNOSIS — B3781 Candidal esophagitis: Secondary | ICD-10-CM

## 2016-01-23 DIAGNOSIS — I10 Essential (primary) hypertension: Secondary | ICD-10-CM

## 2016-01-23 DIAGNOSIS — Z23 Encounter for immunization: Secondary | ICD-10-CM

## 2016-01-23 DIAGNOSIS — E1143 Type 2 diabetes mellitus with diabetic autonomic (poly)neuropathy: Secondary | ICD-10-CM

## 2016-01-23 MED ORDER — QUETIAPINE FUMARATE 50 MG PO TABS: 50.0000 mg | ORAL_TABLET | Freq: Every day | ORAL | 2 refills | Status: DC

## 2016-01-23 MED ORDER — PREGABALIN 150 MG PO CAPS: ORAL_CAPSULE | ORAL | 2 refills | Status: DC

## 2016-01-23 NOTE — Progress Notes (Signed)
Subjective:   Patient ID: [name] [name]    DOB: [birth year], 65 y.o. female   MRN: [id number]  CC: Nausea with epigastric discomfort   HPI: [name] [name] is a 65 y.o. female who presents to clinic today for f/u after hospitalization for concerns of acute on chronic pancreatitis, found to have esophogeal candidiasis. Problems discussed today are as follows:  1. Esophogeal Candidiasis: dx on EGD during hospitalization. Has been taking fluconazole as prescribed, tolerating well. Continues to have nausea with bad taste in mouth occasionally. No dysphagia or vomiting.  2. Essential Hypertension: elevated during hospitalization due to holding medications. Slowly restarted lisinopril but was not aware she was suppose to restart coreg. Other meds include spironolactone. Denies change in vision, headaches.  3. Diabetic Gastroparesis: continues to have nausea without vomiting. Epigastric pain has improved but persists when she eats. Eating soft foods and liquids including applesauce and sorbet.   ROS: See HPI for pertinent ROS.  [place]: Pertinent past medical, surgical, family, and social history were reviewed and updated as appropriate. Smoking status reviewed.  Medications reviewed. Current Outpatient Prescriptions  Medication Sig Dispense Refill  . albuterol (PROVENTIL HFA;VENTOLIN HFA) 108 (90 Base) MCG/ACT inhaler Inhale 2 puffs into the lungs every 4 (four) hours as needed for wheezing or shortness of breath. 6.7 g 2  . ALREX 0.2 % SUSP Apply 1-2 drops to eye 2 (two) times daily as needed (itching).     Marland Kitchen amLODipine (NORVASC) 5 MG tablet Take 1 tablet (5 mg total) by mouth 2 (two) times daily. 60 tablet 5  . ARTIFICIAL TEAR OP Place 1 drop into both eyes 2 (two) times daily as needed (dry eyes).    . Camphor-Eucalyptus-Menthol (VICKS VAPORUB) 4.7-1.2-2.6 % OINT Apply 1 application topically at bedtime. Uses with vaporizer and on chest.     . carvedilol (COREG) 25 MG tablet Take 1 tablet  (25 mg total) by mouth 2 (two) times daily with a meal. 60 tablet 3  . cetirizine (ZYRTEC) 10 MG tablet Take 1 tablet (10 mg total) by mouth daily. 30 tablet 5  . clobetasol ointment (TEMOVATE) [id number] % Apply 1 application topically 2 (two) times daily. Apply under nails (Patient taking differently: Apply 1 application topically 2 (two) times daily as needed (for nails). Apply under nails) 60 g 3  . diazepam (VALIUM) 5 MG tablet Take 1 tablet (5 mg total) by mouth once as needed for anxiety (prior to MRI). 1 tablet 0  . Difluprednate (DUREZOL) 0.05 % EMUL Place 2 drops into the right eye 2 (two) times daily. 5 mL 6  . Difluprednate 0.05 % EMUL Place 1 drop into the right eye 4 (four) times daily.    Marland Kitchen docusate sodium (COLACE) 100 MG capsule Take 100 mg by mouth 2 (two) times daily as needed for moderate constipation.     Marland Kitchen erythromycin (E-MYCIN) 250 MG tablet Take 1 tablet (250 mg total) by mouth 3 (three) times daily. Taking 2-3 times per day 90 tablet 3  . fluconazole (DIFLUCAN) 100 MG tablet Take 2 tablets (200 mg total) by mouth daily. 40 tablet 0  . fluticasone (FLONASE) 50 MCG/ACT nasal spray Place 2 sprays into both nostrils daily. (Patient taking differently: Place 2 sprays into both nostrils daily as needed for allergies. ) 16 g 6  . hydrOXYzine (ATARAX/VISTARIL) 25 MG tablet TAKE 1 TABLET BY MOUTH 3 TIMES DAILY AS NEEDED (Patient taking differently: TAKE 1 TABLET BY MOUTH TWICE DAILY) 30 tablet 5  . Insulin Glargine (  LANTUS SOLOSTAR) 100 UNIT/ML Solostar Pen Inject 40 Units into the skin 2 (two) times daily. 5 pen PRN  . insulin lispro (HUMALOG KWIKPEN) 100 UNIT/ML KiwkPen Inject 0.15 mLs (15 Units total) into the skin 2 (two) times daily as needed (only when eating a meal). 15 mL 11  . lisinopril (PRINIVIL,ZESTRIL) 40 MG tablet Take 1 tablet (40 mg total) by mouth daily. 30 tablet 11  . Loteprednol Etabonate 0.5 % OINT Place 1 application into both eyes at bedtime.    . metoCLOPramide  (REGLAN) 10 MG tablet Take 1 tablet (10 mg total) by mouth 3 (three) times daily before meals. 90 tablet 3  . mometasone-formoterol (DULERA) 200-5 MCG/ACT AERO Inhale 2 puffs into the lungs 2 (two) times daily. 13 g 2  . nystatin (MYCOSTATIN/NYSTOP) powder Apply topically 3 (three) times daily. To affected area (Patient taking differently: Apply 1 g topically daily. To affected area) 30 g 3  . nystatin cream (MYCOSTATIN) Apply 1 application topically 2 (two) times daily. (Patient taking differently: Apply 1 application topically daily. ) 30 g 0  . Olopatadine HCl 0.2 % SOLN Place 1 drop into both eyes 2 (two) times daily.     . ondansetron (ZOFRAN) 4 MG tablet Take 1 tablet (4 mg total) by mouth every 8 (eight) hours as needed for nausea or vomiting. (Patient taking differently: Take 4 mg by mouth 3 (three) times daily. ) 30 tablet 1  . pantoprazole (PROTONIX) 40 MG tablet Take 1 tablet (40 mg total) by mouth daily. 30 tablet 0  . pentosan polysulfate (ELMIRON) 100 MG capsule Take 1 capsule (100 mg total) by mouth 3 (three) times daily before meals. (Patient taking differently: Take 100 mg by mouth 2 (two) times daily. ) 270 capsule 1  . polyethylene glycol (GLYCOLAX) packet Take 17 g by mouth daily as needed for mild constipation. Mix in 4-6 oz of water    . pregabalin (LYRICA) 150 MG capsule Take 1 cap (150mg ) in morning and afternoon. Take 2 caps (300mg ) in evening. 120 capsule 2  . QUEtiapine (SEROQUEL) 50 MG tablet Take 1 tablet (50 mg total) by mouth at bedtime. 30 tablet 2  . ranitidine (ZANTAC) 150 MG tablet Take 1 tablet (150 mg total) by mouth daily as needed for heartburn. For acid reflux (Patient taking differently: Take 150 mg by mouth every morning. For acid reflux) 30 tablet 5  . rosuvastatin (CRESTOR) 20 MG tablet Take 1 tablet (20 mg total) by mouth daily at 6 PM. 30 tablet 0  . sodium chloride (OCEAN) 0.65 % SOLN nasal spray Place 1 spray into both nostrils as needed for congestion.      Marland Kitchen aspirin EC 81 MG tablet Take 81 mg by mouth every morning.     . Aspirin-Acetaminophen-Caffeine (EXCEDRIN MIGRAINE PO) Take 2 capsules by mouth 3 (three) times daily as needed (migraine).      No current facility-administered medications for this visit.     Objective:   BP (!) 180/77 (BP Location: Left Arm, Patient Position: Sitting, Cuff Size: Normal)   Pulse 95   Temp 98.8 F (37.1 C) (Oral)   Ht 5\' 3"  (1.6 m)   Wt 212 lb (96.2 kg)   BMI 37.55 kg/m  Vitals and nursing note reviewed.  General: obese, well developed, in no acute distress with non-toxic appearance HEENT: normocephalic, atraumatic, moist mucous membranes, no thrush appreciated Neck: supple, non-tender without lymphadenopathy CV: regular rate and rhythm without murmurs, rubs, or gallops Lungs: clear to  auscultation bilaterally with normal work of breathing Abdomen: soft, non-tender, non-distended, no masses or organomegaly palpable, normoactive bowel sounds Skin: warm, dry, no rashes or lesions, cap refill < 2 seconds Extremities: warm and well perfused, normal tone  Assessment & Plan:   Candida esophagitis ([place]) Diagnosed by EGD during hospitalization. Consistent with epigastric discomfort. Treatment with fluconazole 100 mg BID for 14 days. Tolerating med well. Needs GI follow up and repeat EGD. - Referral to [facility] for evaluation in 1 month and repeat EGD - Continue fluconazole - Advance diet as tolerated  HYPERTENSION, BENIGN ESSENTIAL Uncontrolled. Taking amlodipine 5 mg BID, and lisinopril 40 mg daily. Was suppose to restart coreg 25 mg daily but did not. Told to restart all of her medications. BP 172/88 at present. Asymptomatic. - Will restart all BP meds - F/u in 1 month  Diabetic gastroparesis associated with type 2 diabetes mellitus Uncontrolled. On reglan and erythromycin as of 8/17. Not been taking.  - Will restart both meds for 2 weeks and pt to stop until f/u in 1 month - Educated on small  and frequent meals - Goal is to help control glucose levels to help improve symtpoms  Orders Placed This Encounter  Procedures  . Ambulatory referral to Gastroenterology    Referral Priority:   Routine    Referral Type:   Consultation    Referral Reason:   Specialty Services Required    Number of Visits Requested:   1   Meds ordered this encounter  Medications  . QUEtiapine (SEROQUEL) 50 MG tablet    Sig: Take 1 tablet (50 mg total) by mouth at bedtime.    Dispense:  30 tablet    Refill:  2  . pregabalin (LYRICA) 150 MG capsule    Sig: Take 1 cap (150mg ) in morning and afternoon. Take 2 caps (300mg ) in evening.    Dispense:  120 capsule    Refill:  2    Harriet Butte, [facility], PGY-1 01/23/2016 12:32 PM

## 2016-01-23 NOTE — Assessment & Plan Note (Addendum)
Diagnosed by EGD during hospitalization. Consistent with epigastric discomfort. Treatment with fluconazole 100 mg BID for 14 days. Tolerating med well. Needs GI follow up and repeat EGD. - Referral to [facility] for evaluation in 1 month and repeat EGD - Continue fluconazole - Advance diet as tolerated

## 2016-01-23 NOTE — Patient Instructions (Addendum)
It was a pleasure to meet you today. Please see below to review our plan for today's visit.  1. I have sent a referral to GI, please call [facility] at [phone] if you are not contacted in 1 week for 1 month follow up. 2. Restart you Reglan and erythromycin as prescribed for you gastroparesis, ONLY TAKE THESE FOR 2 WEEKS THEN STOP. We will reassess control in 1 month. 3. Continue insulin for sugar control. 4. Eat with frequent small amounts and avoid foods rich in sugar in order to control gastroparesis and diabetes. 5. You have received your annual flu shot and your pneumovax shot today. 6. Please restart you Seroquel and lyrica, I will refill these medications. 7. Avoid aspirin 81 mg daily for now until you are seen again by GI Your blood pressure is elevated but you can restart all of your medications including amlodipine, spironolactone, lisinopril, and coreg. 8. Please continue you rosuvastatin for cholesterol. 9. Follow up at clinic in 1 month. 10. Finish fluconazole as prescibed  Please call the clinic at [phone] if your symptoms worsen or you have any concerns. It was my pleasure to see you. -- Harriet Butte, [place], PGY-1   Gastroparesis Gastroparesis, also called delayed gastric emptying, is a condition in which food takes longer than normal to empty from the stomach. The condition is usually long-lasting (chronic). CAUSES This condition may be caused by:  An endocrine disorder, such as hypothyroidism or diabetes. Diabetes is the most common cause of this condition.  A nervous system disease, such as Parkinson disease or multiple sclerosis.  Cancer, infection, or surgery of the stomach or vagus nerve.  A connective tissue disorder, such as scleroderma.  Certain medicines. In most cases, the cause is not known. RISK FACTORS This condition is more likely to develop in:  People with certain disorders, including endocrine disorders, eating  disorders, amyloidosis, and scleroderma.  People with certain diseases, including Parkinson disease or multiple sclerosis.  People with cancer or infection of the stomach or vagus nerve.  People who have had surgery on the stomach or vagus nerve.  People who take certain medicines.  Women. SYMPTOMS Symptoms of this condition include:  An early feeling of fullness when eating.  Nausea.  Weight loss.  Vomiting.  Heartburn.  Abdominal bloating.  Inconsistent blood glucose levels.  Lack of appetite.  Acid from the stomach coming up into the esophagus (gastroesophageal reflux).  Spasms of the stomach. Symptoms may come and go. DIAGNOSIS This condition is diagnosed with tests, such as:  Tests that check how long it takes food to move through the stomach and intestines. These tests include:  Upper gastrointestinal (GI) series. In this test, X-rays of the intestines are taken after you drink a liquid. The liquid makes the intestines show up better on the X-rays.  Gastric emptying scintigraphy. In this test, scans are taken after you eat food that contains a small amount of radioactive material.  Wireless capsule GI monitoring system. This test involves swallowing a capsule that records information about movement through the stomach.  Gastric manometry. This test measures electrical and muscular activity in the stomach. It is done with a thin tube that is passed down the throat and into the stomach.  Endoscopy. This test checks for abnormalities in the lining of the stomach. It is done with a long, thin tube that is passed down the throat and into the stomach.  An ultrasound. This test can help rule out  gallbladder disease or pancreatitis as a cause of your symptoms. It uses sound waves to take pictures of the inside of your body. TREATMENT There is no cure for gastroparesis. This condition may be managed with:  Treatment of the underlying condition causing the  gastroparesis.  Lifestyle changes, including exercise and dietary changes. Dietary changes can include:  Changes in what and when you eat.  Eating smaller meals more often.  Eating low-fat foods.  Eating low-fiber forms of high-fiber foods, such as cooked vegetables instead of raw vegetables.  Having liquid foods in place of solid foods. Liquid foods are easier to digest.  Medicines. These may be given to control nausea and vomiting and to stimulate stomach muscles.  Getting food through a feeding tube. This may be done in severe cases.  A gastric neurostimulator. This is a device that is inserted into the body with surgery. It helps improve stomach emptying and control nausea and vomiting. HOME CARE INSTRUCTIONS  Follow your health care provider's instructions about exercise and diet.  Take medicines only as directed by your health care provider. SEEK MEDICAL CARE IF:  Your symptoms do not improve with treatment.  You have new symptoms. SEEK IMMEDIATE MEDICAL CARE IF:  You have severe abdominal pain that does not improve with treatment.  You have nausea that does not go away.  You cannot keep fluids down.   This information is not intended to replace advice given to you by your health care provider. Make sure you discuss any questions you have with your health care provider.   Document Released: 04/07/2005 Document Revised: 08/22/2014 Document Reviewed: 04/03/2014 Elsevier Interactive Patient Education [employer].

## 2016-01-23 NOTE — Assessment & Plan Note (Addendum)
Uncontrolled. On reglan and erythromycin as of 8/17. Not been taking.  - Will restart both meds for 2 weeks and pt to stop until f/u in 1 month - Educated on small and frequent meals - Goal is to help control glucose levels to help improve symtpoms

## 2016-01-23 NOTE — Addendum Note (Signed)
Addended by: Levert Feinstein F on: 01/23/2016 05:00 PM   Modules accepted: Orders

## 2016-01-23 NOTE — Assessment & Plan Note (Addendum)
Uncontrolled. Taking amlodipine 5 mg BID, and lisinopril 40 mg daily. Was suppose to restart coreg 25 mg daily but did not. Told to restart all of her medications. BP 172/88 at present. Asymptomatic. - Will restart all BP meds - F/u in 1 month

## 2016-01-24 ENCOUNTER — Other Ambulatory Visit: Payer: Self-pay | Admitting: Family Medicine

## 2016-01-24 DIAGNOSIS — G894 Chronic pain syndrome: Secondary | ICD-10-CM

## 2016-01-28 ENCOUNTER — Telehealth: Payer: Self-pay | Admitting: *Deleted

## 2016-01-28 NOTE — Telephone Encounter (Signed)
Called patient to offer to schedule Annual Wellness Visit. Left message on patient's home VM to return call. Alternate number is no longer in service. Velora Heckler, RN

## 2016-01-30 ENCOUNTER — Encounter: Payer: Self-pay | Admitting: Gastroenterology

## 2016-01-30 NOTE — Telephone Encounter (Signed)
Rx called in [facility].

## 2016-02-15 ENCOUNTER — Telehealth: Payer: Self-pay | Admitting: Family Medicine

## 2016-02-15 MED ORDER — IBUPROFEN 600 MG PO TABS: 600.0000 mg | ORAL_TABLET | Freq: Three times a day (TID) | ORAL | 1 refills | Status: DC | PRN

## 2016-02-15 NOTE — Telephone Encounter (Signed)
Pt would like a Rx for ibuprofen . Pt uses [facility] on [address]. Please advise. Thanks! ep

## 2016-02-18 ENCOUNTER — Encounter: Payer: Self-pay | Admitting: Family Medicine

## 2016-02-18 ENCOUNTER — Ambulatory Visit (INDEPENDENT_AMBULATORY_CARE_PROVIDER_SITE_OTHER): Payer: Medicare Other | Admitting: Family Medicine

## 2016-02-18 VITALS — BP 190/92 | HR 79 | Temp 98.6°F | Ht 63.0 in | Wt 228.6 lb

## 2016-02-18 DIAGNOSIS — I10 Essential (primary) hypertension: Secondary | ICD-10-CM

## 2016-02-18 DIAGNOSIS — M5442 Lumbago with sciatica, left side: Secondary | ICD-10-CM

## 2016-02-18 DIAGNOSIS — G8929 Other chronic pain: Secondary | ICD-10-CM | POA: Diagnosis not present

## 2016-02-18 MED ORDER — HYDROCHLOROTHIAZIDE 25 MG PO TABS: 25.0000 mg | ORAL_TABLET | Freq: Every day | ORAL | 3 refills | Status: DC

## 2016-02-18 NOTE — Assessment & Plan Note (Addendum)
Uncontrolled. Reviewed hospital discharge summary - appears HCTZ was held on discharge with plans to restart as outpatient but this has not been done. Will restart today. Follow up with PCP Dr. Brita Romp in the next several weeks for blood pressure, also to recheck BMET.

## 2016-02-18 NOTE — Assessment & Plan Note (Signed)
No red flags. Decent strength on exam (though suspect patient not fully participating) Will refer to Dr. Nelva Bush (physiatry) per patient request. Will check with Tia, our referral coordinator, about whether she needs another referral to pain management.

## 2016-02-18 NOTE — Progress Notes (Signed)
Date of Visit: 02/18/2016   HPI:  Patient presents to discuss back & leg pain. Note patient arrived over 20 mins past her appointment time, and thus the visit was abbreviated today.  Back pain - Flared over the last 2 weeks. Has pain in left lower back, radiating down left leg. Records show history of this in the past. Denies having fever, lower extremity weakness, or problems with stooling or urination. She is mainly here to get a referral to a provider who does back injections. Would like to see Dr. Nelva Bush of [facility]. Her sister sees him with good results. Has been previously referred to pain management but was not able to schedule with them, because she was not going to have the money until after December 1st. States "I want to get off all this medicine".  Hypertension - taking lisinopril 40mg  daily, amlodipine 5mg  twice daily, carvedilol 25mg  twice daily, and an unknown dose of spironolactone daily (not currently on medication list). Took her medications this morning.  ROS: See HPI.  [place]: history of hypertension, type 2 diabetes, diabetic gastroparesis, esophageal candidiasis, HFpEF,   PHYSICAL EXAM: BP (!) 190/92   Pulse 79   Temp 98.6 F (37 C) (Oral)   Ht 5\' 3"  (1.6 m)   Wt 228 lb 9.6 oz (103.7 kg)   BMI 40.49 kg/m  Gen: NAD, pleasant, cooperative HEENT: normocephalic, atraumatic, moist mucous membranes  Heart: regular rate and rhythm, no murmur Lungs: clear to auscultation bilaterally, normal work of breathing  Neuro: alert grossly nonfocal, speech normal Back: nontender to palpation, no muscle spasm of paraspinal muscles Ext: full strength bilateral lower extremities (though participation is limited from patient). Equal patellar reflexes bilaterally. Sensation intact over bilateral lower extremities.   ASSESSMENT/PLAN:  HYPERTENSION, BENIGN ESSENTIAL Uncontrolled. Reviewed hospital discharge summary - appears HCTZ was held on discharge with plans to restart  as outpatient but this has not been done. Will restart today. Follow up with PCP Dr. Brita Romp in the next several weeks for blood pressure, also to recheck BMET.   Left-sided low back pain with left-sided sciatica No red flags. Decent strength on exam (though suspect patient not fully participating) Will refer to Dr. Nelva Bush (physiatry) per patient request. Will check with Tia, our referral coordinator, about whether she needs another referral to pain management.  Note - patient is complex and has many chronic medical problems and very long list of medications. I stressed to her the importance of following up with her PCP Dr. Brita Romp, rather than continuing to see me for acute visits. Instructed her to schedule follow up visit with PCP in the next few weeks.   FOLLOW UP: Follow up in several weeks with PCP Dr. Brita Romp for hypertension Referral to physiatry for back injections Will follow up with [facility] regarding pain management referral.   Delorse Limber. Ardelia Mems, [place]

## 2016-02-18 NOTE — Patient Instructions (Addendum)
Referring to back injection doctor Will check on doing another pain management referral.  Follow up with Dr. Brita Romp. Schedule a follow up visit with her. She is your primary doctor and should see you for these ongoing medical issues.  Restart  HCTZ 25mg  daily. Sent this in for you. Need to follow up with Dr. Brita Romp to recheck kidney function within the next several weeks.  Be well, Dr. Ardelia Mems

## 2016-03-19 ENCOUNTER — Ambulatory Visit (INDEPENDENT_AMBULATORY_CARE_PROVIDER_SITE_OTHER): Payer: Medicare Other | Admitting: Family Medicine

## 2016-03-19 ENCOUNTER — Encounter: Payer: Self-pay | Admitting: Family Medicine

## 2016-03-19 VITALS — BP 162/62 | HR 101 | Temp 98.5°F | Ht 63.0 in | Wt 196.0 lb

## 2016-03-19 DIAGNOSIS — R634 Abnormal weight loss: Secondary | ICD-10-CM | POA: Insufficient documentation

## 2016-03-19 DIAGNOSIS — G894 Chronic pain syndrome: Secondary | ICD-10-CM | POA: Diagnosis not present

## 2016-03-19 DIAGNOSIS — L299 Pruritus, unspecified: Secondary | ICD-10-CM | POA: Diagnosis not present

## 2016-03-19 DIAGNOSIS — I10 Essential (primary) hypertension: Secondary | ICD-10-CM | POA: Diagnosis not present

## 2016-03-19 DIAGNOSIS — G8929 Other chronic pain: Secondary | ICD-10-CM

## 2016-03-19 DIAGNOSIS — M5442 Lumbago with sciatica, left side: Secondary | ICD-10-CM | POA: Diagnosis not present

## 2016-03-19 LAB — CBC
HCT: 39.9 % (ref 35.0–45.0)
Hemoglobin: 13.2 g/dL (ref 11.7–15.5)
MCH: 27.6 pg (ref 27.0–33.0)
MCHC: 33.1 g/dL (ref 32.0–36.0)
MCV: 83.5 fL (ref 80.0–100.0)
Platelets: 166 10*3/uL (ref 140–400)
RBC: 4.78 MIL/uL (ref 3.80–5.10)
RDW: 15.4 % — ABNORMAL HIGH (ref 11.0–15.0)
WBC: 6.2 10*3/uL (ref 3.8–10.8)

## 2016-03-19 MED ORDER — AMITRIPTYLINE HCL 25 MG PO TABS: 25.0000 mg | ORAL_TABLET | Freq: Every day | ORAL | 2 refills | Status: DC

## 2016-03-19 NOTE — Assessment & Plan Note (Signed)
Generalized itching, without clear source or rash on exam Benadryl has not helped the patient Can try more frequent moisturizing and sensitive skin soap and detergents Can try over-the-counter hydrocortisone cream on affected areas as needed to see if this improves symptoms

## 2016-03-19 NOTE — Assessment & Plan Note (Signed)
Elevated today Needs dedicated follow-up, as there were too many other medical problems to deal with today

## 2016-03-19 NOTE — Patient Instructions (Signed)
Nice to meet you today. Try amitriptyline daily at bedtime to help with your back pain and other chronic pain.  You can try a sensitive skin soap and lotion to help with her itching. You can try over-the-counter cortisone cream if you notice a rash.  I'm getting some labs today and someone will call you or send you a letter with the results when they're available.  You need to follow-up in 2 weeks for the weight loss and your blood pressure.  Take care, Dr. Jacinto Reap

## 2016-03-19 NOTE — Assessment & Plan Note (Signed)
No red flags Exam is nonrevealing with some paraspinal muscular tenderness Patient has been referred to pain management multiple times, and she is able to make her own appointment with them when she is able to afford it Continue Lyrica at current dose Start amitriptyline 25 mg daily at bedtime to hopefully improve her chronic pain regimen in the meantime

## 2016-03-19 NOTE — Progress Notes (Signed)
Subjective:   [name] [name] is a 65 y.o. female with a history of HTN, CAD, diastolic heart failure, recurrent pancreatitis, T2 DM with gastroparesis, HLD, chronic pain syndrome here for pain management  Back pain - chronic L sided LBP - seems to radiate to R side and down L leg - present for 2-3 months - stinging and burning and itching pain down L leg that seems to spread to rest of limbs - sometimes incontinent of urine at baseline, no loss of stool - takes ibuprofen, had oxycodone once - take Lyrica 1 tab in AM, 1 in afternoon, 2 tabs qhs - hoping to be able to go to pain clinic after getting insurance money next week  "Hives" - using alcohol wash to try to help itch - scratching so hard - no new foods, because not eating much - was on fluconazole in 12/2015, but no longer, no new meds - mostly present on face and arms - really bad yesterday - wonders if it could be shingles - tried benadryl - calmed it down - baby lotion seems to make it itch worse - anything with perfumes in it makes it worse - changed to arm & hammer sensitive detergent that has helped some  Weight loss - sweats - intermittent day and nighttime - no SOB - coughing up white stuff intermittently - no appetite, worse in AM - taking insulin - nauseous regularly, vomiting Occasionally - eating broth - rare dysphagia - epigastric pain all the time from chronic pancreatitis - down 30 lbs in last month - UTD on colonoscopy and mammogram - strong family h/o cancer - lymphoma, throat cancer, prostate cancer - no breast or colon cancer  - has GI f/u appt next week for candidal esophagitis  Review of Systems:  Per HPI.   Social History: Current smoker - down to 3 cigarettes per day  Objective:  BP (!) 162/62   Pulse (!) 101   Temp 98.5 F (36.9 C) (Oral)   Ht 5\' 3"  (1.6 m)   Wt 196 lb (88.9 kg)   BMI 34.72 kg/m   Gen:  66 y.o. female in NAD  HEENT: NCAT, MMM, EOMI, PERRL, anicteric sclerae, OP  clear Neck: Supple, no thyromegaly, some fullness under bilateral mandibles that likely represents salivary glands Lymph: No LAD appreciated in axilla or groin bilaterally CV: RRR, no MRG Resp: Non-labored, CTAB, no wheezes noted Abd: Soft, mildly tender to palpation in epigastrium, BS present, no guarding or organomegaly Ext: WWP, no edema MSK: Back: L sided paraspinal TTP, no midline tenderness, no muscle spasms noted, negative straight leg raise, strength of bilateral lower extremities, though participation is limited from the patient, equal DTRs bilaterally in lower extremities, sensation intact to soft touch over bilateral lower extremities Neuro: Alert and oriented, speech normal Skin: No rashes noted, some dry skin present on areas of concern        Chemistry      Component Value Date/Time   NA 140 01/18/2016 0526   K 3.6 01/18/2016 0526   CL 106 01/18/2016 0526   CO2 27 01/18/2016 0526   BUN <5 (L) 01/18/2016 0526   CREATININE 0.87 01/18/2016 0526   CREATININE 0.78 12/31/2015 1213      Component Value Date/Time   CALCIUM 9.1 01/18/2016 0526   ALKPHOS 73 01/18/2016 0526   AST 16 01/18/2016 0526   ALT 18 01/18/2016 0526   BILITOT 1.1 01/18/2016 0526      Lab Results  Component Value Date  WBC 5.9 01/18/2016   HGB 14.0 01/18/2016   HCT 43.4 01/18/2016   MCV 86.3 01/18/2016   PLT 144 (L) 01/18/2016   Lab Results  Component Value Date   TSH 1.093 01/15/2011   Lab Results  Component Value Date   HGBA1C 9.1 12/31/2015   Assessment & Plan:     [name] [name] is a 65 y.o. female here for   HYPERTENSION, BENIGN ESSENTIAL Elevated today Needs dedicated follow-up, as there were too many other medical problems to deal with today  Loss of weight Multiple concerning possible etiologies From records, it appears the patient has lost about 30 pounds in the last month unintentionally Patient is up-to-date on cancer screening including colonoscopy and mammogram Does  have family history of other cancers Does smoke and likely needs an update on her low-dose CT of her chest Has some symptoms concerning for TB versus lymphoma including sweats and the weight loss Could also be related to her chronic pancreatitis and recent candidal esophagitis that would decrease her appetite Needs to follow-up with GI as previously scheduled Check labs today including HIV, CBC, CMP, hepatitis C, TSH, chronic appearing called Follow-up in 2 weeks and if patient has continued to lose weight, consider CT chest with contrast to evaluate for possible lung cancer as well as possible CT abdomen and pelvis to evaluate for possible complications from her chronic pancreatitis  Left-sided low back pain with left-sided sciatica No red flags Exam is nonrevealing with some paraspinal muscular tenderness Patient has been referred to pain management multiple times, and she is able to make her own appointment with them when she is able to afford it Continue Lyrica at current dose Start amitriptyline 25 mg daily at bedtime to hopefully improve her chronic pain regimen in the meantime  Itching Generalized itching, without clear source or rash on exam Benadryl has not helped the patient Can try more frequent moisturizing and sensitive skin soap and detergents Can try over-the-counter hydrocortisone cream on affected areas as needed to see if this improves symptoms   Also advised patient that she needs follow-up for her chronic medical conditions including hypertension and diabetes   Virginia Crews, MD MPH PGY-3,  [facility] 03/19/2016  4:59 PM

## 2016-03-19 NOTE — Assessment & Plan Note (Signed)
Multiple concerning possible etiologies From records, it appears the patient has lost about 30 pounds in the last month unintentionally Patient is up-to-date on cancer screening including colonoscopy and mammogram Does have family history of other cancers Does smoke and likely needs an update on her low-dose CT of her chest Has some symptoms concerning for TB versus lymphoma including sweats and the weight loss Could also be related to her chronic pancreatitis and recent candidal esophagitis that would decrease her appetite Needs to follow-up with GI as previously scheduled Check labs today including HIV, CBC, CMP, hepatitis C, TSH, chronic appearing called Follow-up in 2 weeks and if patient has continued to lose weight, consider CT chest with contrast to evaluate for possible lung cancer as well as possible CT abdomen and pelvis to evaluate for possible complications from her chronic pancreatitis

## 2016-03-20 ENCOUNTER — Encounter: Payer: Self-pay | Admitting: Family Medicine

## 2016-03-20 LAB — COMPLETE METABOLIC PANEL WITH GFR
ALT: 14 U/L (ref 6–29)
AST: 16 U/L (ref 10–35)
Albumin: 3.9 g/dL (ref 3.6–5.1)
Alkaline Phosphatase: 79 U/L (ref 33–130)
BUN: 15 mg/dL (ref 7–25)
CO2: 25 mmol/L (ref 20–31)
Calcium: 9.3 mg/dL (ref 8.6–10.4)
Chloride: 102 mmol/L (ref 98–110)
Creat: 0.75 mg/dL (ref 0.50–0.99)
GFR, Est African American: >89 mL/min (ref >=60)
GFR, Est Non African American: 83 mL/min (ref >=60)
Glucose, Bld: 157 mg/dL — ABNORMAL HIGH (ref 65–99)
Potassium: 3.3 mmol/L — ABNORMAL LOW (ref 3.5–5.3)
Sodium: 139 mmol/L (ref 135–146)
Total Bilirubin: 0.4 mg/dL (ref 0.2–1.2)
Total Protein: 7 g/dL (ref 6.1–8.1)

## 2016-03-20 LAB — HIV ANTIBODY (ROUTINE TESTING W REFLEX): HIV 1&2 Ab, 4th Generation: NONREACTIVE

## 2016-03-20 LAB — TSH: TSH: 0.89 mIU/L

## 2016-03-20 LAB — HEPATITIS C ANTIBODY: HCV Ab: NEGATIVE

## 2016-03-24 LAB — QUANTIFERON TB GOLD ASSAY (BLOOD)
Quantiferon Nil Value: >10 IU/mL
Quantiferon Tb Ag Minus Nil Value: >10 IU/mL

## 2016-04-02 ENCOUNTER — Other Ambulatory Visit: Payer: Self-pay

## 2016-04-02 ENCOUNTER — Ambulatory Visit: Payer: Medicare Other | Admitting: Gastroenterology

## 2016-04-04 ENCOUNTER — Ambulatory Visit: Payer: Medicare Other | Admitting: Family Medicine

## 2016-04-10 ENCOUNTER — Ambulatory Visit: Payer: Medicare Other | Admitting: Family Medicine

## 2016-04-10 ENCOUNTER — Other Ambulatory Visit: Payer: Self-pay | Admitting: Family Medicine

## 2016-04-10 ENCOUNTER — Telehealth: Payer: Self-pay | Admitting: Family Medicine

## 2016-04-10 DIAGNOSIS — G894 Chronic pain syndrome: Secondary | ICD-10-CM

## 2016-04-10 NOTE — Telephone Encounter (Signed)
Pt is calling for a refill on her Lyrica, and her diabetes medication, Lisinopril. Epic shows refills left on Lisinopril but she said the pharmacy didn't have any from [place]. Can we call and get this straighten out. jw

## 2016-04-11 ENCOUNTER — Telehealth: Payer: Self-pay | Admitting: Family Medicine

## 2016-04-11 DIAGNOSIS — G894 Chronic pain syndrome: Secondary | ICD-10-CM

## 2016-04-11 MED ORDER — PREGABALIN 150 MG PO CAPS: ORAL_CAPSULE | ORAL | 0 refills | Status: DC

## 2016-04-11 NOTE — Telephone Encounter (Signed)
Please let the patient know that a Rx was called into the pharmacy for Lyrica.  Thanks. Archie Patten, MD [facility] Resident  04/11/2016, 4:53 PM

## 2016-04-23 ENCOUNTER — Telehealth: Payer: Self-pay | Admitting: Family Medicine

## 2016-04-23 MED ORDER — ALBUTEROL SULFATE HFA 108 (90 BASE) MCG/ACT IN AERS: 2.0000 | INHALATION_SPRAY | RESPIRATORY_TRACT | 2 refills | Status: DC | PRN

## 2016-04-23 NOTE — Telephone Encounter (Signed)
Rx sent.  Virginia Crews, MD, MPH PGY-3,  [facility] 04/23/2016 3:18 PM

## 2016-04-23 NOTE — Telephone Encounter (Signed)
Pt is calling for a refill on her albuterol called in. jw

## 2016-05-01 ENCOUNTER — Ambulatory Visit (INDEPENDENT_AMBULATORY_CARE_PROVIDER_SITE_OTHER): Payer: Medicare Other | Admitting: Family Medicine

## 2016-05-01 ENCOUNTER — Encounter: Payer: Self-pay | Admitting: Family Medicine

## 2016-05-01 VITALS — BP 178/82 | HR 99 | Temp 99.1°F | Ht 63.0 in | Wt 216.0 lb

## 2016-05-01 DIAGNOSIS — E1100 Type 2 diabetes mellitus with hyperosmolarity without nonketotic hyperglycemic-hyperosmolar coma (NKHHC): Secondary | ICD-10-CM

## 2016-05-01 DIAGNOSIS — J441 Chronic obstructive pulmonary disease with (acute) exacerbation: Secondary | ICD-10-CM | POA: Diagnosis not present

## 2016-05-01 LAB — POCT GLYCOSYLATED HEMOGLOBIN (HGB A1C): Hemoglobin A1C: 7.8

## 2016-05-01 MED ORDER — ALBUTEROL SULFATE (2.5 MG/3ML) 0.083% IN NEBU
2.5000 mg | INHALATION_SOLUTION | Freq: Once | RESPIRATORY_TRACT | Status: AC
  Administered 2016-05-01: 2.5 mg via RESPIRATORY_TRACT

## 2016-05-01 MED ORDER — PREDNISONE 50 MG PO TABS: 50.0000 mg | ORAL_TABLET | Freq: Every day | ORAL | 0 refills | Status: AC

## 2016-05-01 MED ORDER — DOXYCYCLINE HYCLATE 100 MG PO TABS: 100.0000 mg | ORAL_TABLET | Freq: Two times a day (BID) | ORAL | 0 refills | Status: DC

## 2016-05-01 MED ORDER — METHYLPREDNISOLONE SODIUM SUCC 125 MG IJ SOLR
62.5000 mg | Freq: Once | INTRAMUSCULAR | Status: AC
  Administered 2016-05-01: 62.5 mg via INTRAMUSCULAR

## 2016-05-01 MED ORDER — ALBUTEROL SULFATE HFA 108 (90 BASE) MCG/ACT IN AERS
2.0000 | INHALATION_SPRAY | RESPIRATORY_TRACT | 2 refills | Status: DC | PRN
Start: 2016-05-01 — End: 2016-07-24

## 2016-05-01 MED ORDER — IPRATROPIUM BROMIDE 0.02 % IN SOLN
0.5000 mg | Freq: Once | RESPIRATORY_TRACT | Status: AC
  Administered 2016-05-01: 0.5 mg via RESPIRATORY_TRACT

## 2016-05-01 NOTE — Progress Notes (Signed)
   Subjective:   [name] [name] is a 66 y.o. female with a history of HTN, CAD, diastolic heart failure, chronic pancreatitis, COPD, T2 DM here for cough  Cough - present for ~2 weeks - productive of green sputum - +fevers - Tmax 103 - present for ~1wk - tried mucinex, nyquil, tylenol - hurts when coughing - +SOB - has COPD - grandchildren with similar symptoms - taking dulera BID - ran out of albuterol - diarrhea, rhinorrhea, urinary incontinence with coughing  BP - Denies any chest pain, lower extremity edema, headaches, vision changes with her elevated blood pressure - Has taken her blood pressure medications this morning  Review of Systems:  Per HPI.   Social History: current smoker  Objective:  BP (!) 178/82   Pulse 99   Temp 99.1 F (37.3 C) (Oral)   Ht 5\' 3"  (1.6 m)   Wt 216 lb (98 kg)   BMI 38.26 kg/m   Gen:  66 y.o. female in NAD, appears uncomfortable HEENT: NCAT, MMM, EOMI, PERRL, anicteric sclerae, OP clear, +nasal congestion CV: RRR, no MRG Resp: Non-labored, diffuse wheezes, no focal crackles Abd: Soft, NTND, BS present, no guarding or organomegaly Ext: WWP, no edema MSK: No obvious deformities, gait intact Neuro: Alert and oriented, speech normal       Chemistry      Component Value Date/Time   NA 139 03/19/2016 1444   K 3.3 (L) 03/19/2016 1444   CL 102 03/19/2016 1444   CO2 25 03/19/2016 1444   BUN 15 03/19/2016 1444   CREATININE 0.75 03/19/2016 1444      Component Value Date/Time   CALCIUM 9.3 03/19/2016 1444   ALKPHOS 79 03/19/2016 1444   AST 16 03/19/2016 1444   ALT 14 03/19/2016 1444   BILITOT 0.4 03/19/2016 1444      Lab Results  Component Value Date   WBC 6.2 03/19/2016   HGB 13.2 03/19/2016   HCT 39.9 03/19/2016   MCV 83.5 03/19/2016   PLT 166 03/19/2016   Lab Results  Component Value Date   TSH 0.89 03/19/2016   Lab Results  Component Value Date   HGBA1C 7.8 05/01/2016   Assessment & Plan:     [name] [name]  is a 66 y.o. female here for   COPD exacerbation ([place]) Symptoms consistent with likely COPD exacerbation in the setting of viral URI Given IM Solu-Medrol 60 mg today in clinic as well as DuoNeb Treat with 4 more days of prednisone starting tomorrow as well as 7 days of doxycycline twice a day Would prefer to get chest x-ray to rule out pneumonia but patient reports that this is technically difficult given transportation issues Doxycycline would treat COPD exacerbation or CAP Close follow-up and return precautions given Continue CXR if not improving   Also advised discussion on chronic medical problems including HTN and T2 DM, the patient reports that she is too 4 today to discuss these issues  Virginia Crews, MD MPH PGY-3,  [facility] 05/01/2016  12:08 PM

## 2016-05-01 NOTE — Assessment & Plan Note (Signed)
Symptoms consistent with likely COPD exacerbation in the setting of viral URI Given IM Solu-Medrol 60 mg today in clinic as well as DuoNeb Treat with 4 more days of prednisone starting tomorrow as well as 7 days of doxycycline twice a day Would prefer to get chest x-ray to rule out pneumonia but patient reports that this is technically difficult given transportation issues Doxycycline would treat COPD exacerbation or CAP Close follow-up and return precautions given Continue CXR if not improving

## 2016-05-01 NOTE — Patient Instructions (Signed)
Nice to see you again today. I'm sorry that you're feeling unwell. This likely a COPD exacerbation. We'll give your breathing treatment in a shot of steroids in clinic today. He will start oral steroids tomorrow and take them for 4 more days. He will start an antibiotic called doxycycline tonight and take it for total 7 days. If you have any respiratory distress, please go to the emergency department. Please follow-up in clinic next Monday to that we can make sure that you are getting better.  Take care,  Dr. Jacinto Reap

## 2016-05-05 ENCOUNTER — Encounter: Payer: Self-pay | Admitting: Family Medicine

## 2016-05-05 ENCOUNTER — Telehealth: Payer: Self-pay | Admitting: Family Medicine

## 2016-05-05 ENCOUNTER — Ambulatory Visit (INDEPENDENT_AMBULATORY_CARE_PROVIDER_SITE_OTHER): Payer: Medicare Other | Admitting: Family Medicine

## 2016-05-05 DIAGNOSIS — I10 Essential (primary) hypertension: Secondary | ICD-10-CM

## 2016-05-05 DIAGNOSIS — J441 Chronic obstructive pulmonary disease with (acute) exacerbation: Secondary | ICD-10-CM

## 2016-05-05 MED ORDER — MOMETASONE FURO-FORMOTEROL FUM 200-5 MCG/ACT IN AERO: 2.0000 | INHALATION_SPRAY | Freq: Two times a day (BID) | RESPIRATORY_TRACT | 2 refills | Status: DC

## 2016-05-05 MED ORDER — DOXYCYCLINE HYCLATE 100 MG PO TABS: 100.0000 mg | ORAL_TABLET | Freq: Two times a day (BID) | ORAL | 0 refills | Status: AC

## 2016-05-05 MED ORDER — ALBUTEROL SULFATE (2.5 MG/3ML) 0.083% IN NEBU: 2.5000 mg | INHALATION_SOLUTION | Freq: Four times a day (QID) | RESPIRATORY_TRACT | 3 refills | Status: DC | PRN

## 2016-05-05 NOTE — Progress Notes (Signed)
Date of Visit: 05/05/2016   HPI:  Patient presents to follow up on cough. Seen last week on 1/11 by PCP Dr. Brita Romp for cough/COPD exacerbation, with concern for pneumonia given fevers. Prescribed a course of doxycycline x7 days, also given solumedrol in office and four days of prednisone.  Patient reports she is somewhat better, though not all the way better. Has completed course of prednisone and has just a couple more days of doxycycline left. Still coughing. Chest muscles hurt from coughing. Is producing thick mucous still. Also has headache. No numbness/tingling/weakness or speech changes. Took her blood pressure medications this morning.  Notably she reports her nebulizer has been broken so she hasn't been able to get any breathing treatments. Was not able to afford albuterol inhaler. Does not have her dulera inhaler either, has been out for a few weeks. She thinks she would benefit from having access to breathing treatments. Does not have a working nebulizer at home.   ROS: See HPI.  [place]: history of diabetes, depression/ chronic pain, hypertension, GERD,   PHYSICAL EXAM: BP (!) 168/88   Pulse 88   Temp 98.4 F (36.9 C) (Oral)   Ht 5\' 3"  (1.6 m)   Wt 214 lb 6.4 oz (97.3 kg)   SpO2 96%   BMI 37.98 kg/m  Gen: NAD, pleasant, cooperative HEENT: normocephalic, atraumatic, moist mucous membranes. Nares patent, oropharynx clear and moist Heart: regular rate and rhythm, no murmur Lungs: clear to auscultation bilaterally, normal work of breathing. Speaks in full sentences without distress.  Chest wall: anterior chest wall tender to palpation, reproduces chest pain endorsed by patient  Neuro: alert grossly nonfocal, speech normal, gait normal Ext: No appreciable lower extremity edema bilaterally   ASSESSMENT/PLAN:  HYPERTENSION, BENIGN ESSENTIAL Blood pressure remains elevated, with patient reporting she took her medications this morning. Difficult to tell if blood pressure  elevated due to inadequate antihypertensive therapy or if it is because she is still sick. Gave her the option of starting a new antihypertensive today, or waiting until she is re-evaluated on Thursday. Patient preferred to stay on present medications and wait for repeat assessment in 3 days.  COPD exacerbation ([place]) Improving though not entirely resolved. Still with cough, though afebrile and well appearing. Ambulated in clinic without hypoxia today. Chest pain endorsed by patient is reproducible with palpation - highly consistent with muscle soreness due to coughing. Would benefit from beta agonist therapy. Plan: - Will extend course of doxycycline three more days for a total of 10 days of therapy. - Patient given nebulizer machine today in clinic. Albuterol neb solution sent to her pharmacy - refill of dulera provided (may have brought on exacerbation to be out of this inhaler for last couple of weeks) - follow up in 3 days in clinic for reassessment, and to recheck blood pressure  - patient agreeable to this plan  FOLLOW UP: Follow up in 3 days for COPD exacerbation/pneumonia & hypertension  [place] J. Ardelia Mems, [place]

## 2016-05-05 NOTE — Patient Instructions (Signed)
Sent in another 3 days of doxycycline for you For a total of 10 days (7 and then 3 more)  Giving nebulizer machine in clinic Sent in albuterol neb medicine to your pharmacy Also refilled Dulera  Follow up here on Thursday for recheck.  Be well, Dr. Ardelia Mems

## 2016-05-05 NOTE — Telephone Encounter (Signed)
Pt was seen today by Dr. Ardelia Mems and prescribed Ruthe Mannan. The pharmacy told her that this prescription would be $170.00. She can not afford that at all and would like something else called in . jw

## 2016-05-07 NOTE — Assessment & Plan Note (Signed)
Improving though not entirely resolved. Still with cough, though afebrile and well appearing. Ambulated in clinic without hypoxia today. Chest pain endorsed by patient is reproducible with palpation - highly consistent with muscle soreness due to coughing. Would benefit from beta agonist therapy. Plan: - Will extend course of doxycycline three more days for a total of 10 days of therapy. - Patient given nebulizer machine today in clinic. Albuterol neb solution sent to her pharmacy - refill of dulera provided (may have brought on exacerbation to be out of this inhaler for last couple of weeks) - follow up in 3 days in clinic for reassessment, and to recheck blood pressure  - patient agreeable to this plan

## 2016-05-07 NOTE — Assessment & Plan Note (Signed)
Blood pressure remains elevated, with patient reporting she took her medications this morning. Difficult to tell if blood pressure elevated due to inadequate antihypertensive therapy or if it is because she is still sick. Gave her the option of starting a new antihypertensive today, or waiting until she is re-evaluated on Thursday. Patient preferred to stay on present medications and wait for repeat assessment in 3 days.

## 2016-05-08 ENCOUNTER — Other Ambulatory Visit: Payer: Self-pay | Admitting: Family Medicine

## 2016-05-09 NOTE — Telephone Encounter (Signed)
Patient informed, stated she call her insurance company and give [place] a call on Monday. Nat Christen, CMA

## 2016-05-09 NOTE — Telephone Encounter (Signed)
Patient should contact her insurance company to find out what is cheaper on their formulary. Once we know this, can send in a different inhaler. Please inform patient   Leeanne Rio, MD

## 2016-05-13 ENCOUNTER — Ambulatory Visit (INDEPENDENT_AMBULATORY_CARE_PROVIDER_SITE_OTHER): Payer: Medicare Other | Admitting: Family Medicine

## 2016-05-13 ENCOUNTER — Encounter: Payer: Self-pay | Admitting: Family Medicine

## 2016-05-13 VITALS — BP 172/76 | HR 101 | Temp 98.3°F | Ht 63.0 in | Wt 218.2 lb

## 2016-05-13 DIAGNOSIS — G894 Chronic pain syndrome: Secondary | ICD-10-CM | POA: Diagnosis not present

## 2016-05-13 DIAGNOSIS — R0789 Other chest pain: Secondary | ICD-10-CM

## 2016-05-13 DIAGNOSIS — J449 Chronic obstructive pulmonary disease, unspecified: Secondary | ICD-10-CM

## 2016-05-13 DIAGNOSIS — I10 Essential (primary) hypertension: Secondary | ICD-10-CM | POA: Diagnosis not present

## 2016-05-13 MED ORDER — AMLODIPINE BESYLATE 10 MG PO TABS: 10.0000 mg | ORAL_TABLET | Freq: Every day | ORAL | 3 refills | Status: DC

## 2016-05-13 MED ORDER — BENZONATATE 100 MG PO CAPS: 100.0000 mg | ORAL_CAPSULE | Freq: Two times a day (BID) | ORAL | 0 refills | Status: DC | PRN

## 2016-05-13 NOTE — Progress Notes (Signed)
Subjective:   [name] [name] is a 66 y.o. female with a history of HTN, diastolic heart failure, CAD, COPD, recurrent pancreatitis, chronic pain syndrome with intermittent sciatica here for follow-up of cough and hypertension  Cough Present for about 4 weeks Chest pain with coughing in center of chest 3-4 episodes of vomiting over 2-3 days - looks like sputum Dark green sputum Subjective fevers last night Finished 10 day course of doxycyline and 5 days of prednisone - seemed to help some, worse after stopping Keeping fluids down - not much solids dulera - couldn't afford - has been out for ~1 month - insurance said another medicine would be cheaper but she does not know which medication Using nebs q6h - helping with breathing Also with sore throat, hoarse voice  HTN Blood pressure elevated for last 3 visits Reports that she is taking her blood pressure medications as prescribed, except amlodipine is only being taken once daily (is prescribed 5 mg twice a day) Reports chest pain as above Reports shortness of breath only when coughing Denies sudden vision changes, lower extremity edema   Review of Systems:  Per HPI.   Social History: Current  smoker  Objective:  BP (!) 172/76   Pulse (!) 101   Temp 98.3 F (36.8 C) (Oral)   Ht 5\' 3"  (1.6 m)   Wt 218 lb 3.2 oz (99 kg)   SpO2 96%   BMI 38.65 kg/m   Gen:  66 y.o. female, appears uncomfortable but not in distress  HEENT: NCAT, MMM, EOMI, PERRL, anicteric sclerae, OP clear without erythema or exudate, TMs clear bilaterally, hoarse voice Neck: Supple, no LAD, bilateral enlarged salivary glands that are tender to palpation  CV: RRR, no MRG Resp: Non-labored, CTAB,with exception of intermittent expiratory wheeze diffusely  Abd: Soft, NTND, BS present, no guarding or organomegaly Ext: WWP, no edema MSK:No obvious deformities, gait intact  Neuro: Alert and oriented, speech normal       Chemistry      Component Value  Date/Time   NA 139 03/19/2016 1444   K 3.3 (L) 03/19/2016 1444   CL 102 03/19/2016 1444   CO2 25 03/19/2016 1444   BUN 15 03/19/2016 1444   CREATININE 0.75 03/19/2016 1444      Component Value Date/Time   CALCIUM 9.3 03/19/2016 1444   ALKPHOS 79 03/19/2016 1444   AST 16 03/19/2016 1444   ALT 14 03/19/2016 1444   BILITOT 0.4 03/19/2016 1444      Lab Results  Component Value Date   WBC 6.2 03/19/2016   HGB 13.2 03/19/2016   HCT 39.9 03/19/2016   MCV 83.5 03/19/2016   PLT 166 03/19/2016   Lab Results  Component Value Date   TSH 0.89 03/19/2016   Lab Results  Component Value Date   HGBA1C 7.8 05/01/2016   Assessment & Plan:     [name] [name] is a 66 y.o. female here for   HYPERTENSION, BENIGN ESSENTIAL Uncontrolled Difficult to tell if blood pressure elevation is due to inadequate antihypertensive therapy or if it is related to her illness As it is been elevated for multiple visits, increase amlodipine to 10 mg daily and continue other antihypertensives No red flags Follow-up in 2 weeks  COPD exacerbation ([facility]) Exacerbation seems to have resolved with no hypoxia or adventitious lung sounds Chest pain endorse the patient is reproducible with palpation and highly consistent with muscle soreness due to coughing Patient is afebrile and well-appearing despite continued cough Continue nebulizers as  needed Advised patient to talk to her pharmacy about alternative for [facility] that will be affordable - needs a controller medication Return precautions discussed  Chronic pain syndrome Continues to report chronic low back pain with sciatica and neuropathic pain Advised patient to continue her Lyrica and await pain management appointment  Chest pain As above, atypical chest pain Not concerning for cardiac etiology at this time     Virginia Crews, MD MPH PGY-3,  [facility] 05/13/2016  1:51 PM

## 2016-05-13 NOTE — Assessment & Plan Note (Signed)
Exacerbation seems to have resolved with no hypoxia or adventitious lung sounds Chest pain endorse the patient is reproducible with palpation and highly consistent with muscle soreness due to coughing Patient is afebrile and well-appearing despite continued cough Continue nebulizers as needed Advised patient to talk to her pharmacy about alternative for [facility] that will be affordable - needs a controller medication Return precautions discussed

## 2016-05-13 NOTE — Assessment & Plan Note (Signed)
Uncontrolled Difficult to tell if blood pressure elevation is due to inadequate antihypertensive therapy or if it is related to her illness As it is been elevated for multiple visits, increase amlodipine to 10 mg daily and continue other antihypertensives No red flags Follow-up in 2 weeks

## 2016-05-13 NOTE — Patient Instructions (Signed)
Nice to see you again today. Your lungs are clear and there is a sign that you're having a COPD exacerbation at this time. He can use Tessalon as needed for your cough. Is important for you to call your insurance company and discuss with them what medicine is like 2 layer that would be affordable on your insurance plan. Then please call the clinic and let us know which medicine they have told she would be affordable.  We're increasing your blood pressure medicine called amlodipine to 10 mg daily.  Please follow-up in 2 weeks.  Take care, Dr. Jacinto Reap

## 2016-05-13 NOTE — Assessment & Plan Note (Signed)
As above, atypical chest pain Not concerning for cardiac etiology at this time

## 2016-05-13 NOTE — Assessment & Plan Note (Signed)
Continues to report chronic low back pain with sciatica and neuropathic pain Advised patient to continue her Lyrica and await pain management appointment

## 2016-05-20 ENCOUNTER — Other Ambulatory Visit: Payer: Self-pay | Admitting: Family Medicine

## 2016-05-20 NOTE — Telephone Encounter (Signed)
Pt is calling because she would like the doctor to call in some stronger cough syrup. She stills has the cough and would like to get rid of this. jw

## 2016-05-22 NOTE — Telephone Encounter (Signed)
Reglan refilled.  Patient will need to be re-evaluated before stronger cough meds discussed.  Please have patient schedule appt.  Virginia Crews, MD, MPH PGY-3,  [facility] 05/22/2016 9:42 AM

## 2016-05-23 ENCOUNTER — Other Ambulatory Visit: Payer: Self-pay | Admitting: Family Medicine

## 2016-05-23 DIAGNOSIS — G894 Chronic pain syndrome: Secondary | ICD-10-CM

## 2016-05-26 ENCOUNTER — Other Ambulatory Visit: Payer: Self-pay | Admitting: Family Medicine

## 2016-05-26 DIAGNOSIS — G894 Chronic pain syndrome: Secondary | ICD-10-CM

## 2016-05-26 NOTE — Telephone Encounter (Signed)
Called into pharmacy 05/26/2016   Virginia Crews, MD, MPH PGY-3,  [facility] 05/26/2016 2:51 PM

## 2016-05-27 ENCOUNTER — Telehealth: Payer: Self-pay | Admitting: *Deleted

## 2016-05-27 ENCOUNTER — Encounter: Payer: Self-pay | Admitting: Family Medicine

## 2016-05-27 ENCOUNTER — Ambulatory Visit (INDEPENDENT_AMBULATORY_CARE_PROVIDER_SITE_OTHER): Payer: Medicare Other | Admitting: Family Medicine

## 2016-05-27 VITALS — BP 103/60 | HR 87 | Temp 98.6°F | Ht 63.0 in | Wt 214.2 lb

## 2016-05-27 DIAGNOSIS — M5442 Lumbago with sciatica, left side: Secondary | ICD-10-CM

## 2016-05-27 DIAGNOSIS — G8929 Other chronic pain: Secondary | ICD-10-CM | POA: Diagnosis not present

## 2016-05-27 DIAGNOSIS — M797 Fibromyalgia: Secondary | ICD-10-CM

## 2016-05-27 DIAGNOSIS — J449 Chronic obstructive pulmonary disease, unspecified: Secondary | ICD-10-CM | POA: Diagnosis not present

## 2016-05-27 MED ORDER — LISINOPRIL 40 MG PO TABS: 40.0000 mg | ORAL_TABLET | Freq: Every day | ORAL | 11 refills | Status: DC

## 2016-05-27 MED ORDER — NORTRIPTYLINE HCL 50 MG PO CAPS: 50.0000 mg | ORAL_CAPSULE | Freq: Every day | ORAL | 2 refills | Status: DC

## 2016-05-27 NOTE — Patient Instructions (Signed)
Stop amitriptyline and start nortriptyline daily at bedtime for your chronic pain. Continue Lyrica. This was called into the pharmacy earlier this week.  We will complete the form for your inhalers and return it to your insurance.  Follow-up in one month or sooner as needed.  Take care, Dr. Jacinto Reap

## 2016-05-27 NOTE — Telephone Encounter (Signed)
Prior Authorization received from [employer] for [place]. Formulary and PA form placed in provider box for completion. Derl Barrow, RN

## 2016-05-27 NOTE — Progress Notes (Signed)
Subjective:   [name] [name] is a 66 y.o. female with a history of HTN, diastolic heart failure, CAD, COPD, recurrent pancreatitis, chronic pain syndrome with intermittent sciatica here for cough and pain follow-up  Cough Present for about 1 month Chest pain with coughing in center of chest Non productive but feels wet in chest Subjective fevers today Finished 10 day course of doxycyline and 5 days of prednisone - seemed to help some, worse after stopping Keeping fluids down - not much solids dulera - couldn't afford - has been out for ~1 month - insurance said another medicine would be cheaper but she does not know which medication Using nebs q6h - helping with breathing Also with sore throat, hoarse voice Not taking dulera or albuterol because she was unable to afford them - Needs a form filled out for insurance  Pain - takes ibuprofen, had oxycodone once - take Lyrica 1 tab in AM, 1 in afternoon, 2 tabs qhs - has been out for 2 days - pain worse since running out of lyrica - hoping to be able to go to pain clinic after getting insurance money next week - chronic bilateral LBP - seems to radiate to R side and down L leg - numbness in L foot and stabbing pain down R leg - some stress urine incontinence at baseline, no loss of stool   Review of Systems:  Per HPI.   Social History: former smoker - no cigarettes in last week, only smokes when stressed  Objective:  BP 103/60   Pulse 87   Temp 98.6 F (37 C) (Oral)   Ht 5\' 3"  (1.6 m)   Wt 214 lb 3.2 oz (97.2 kg)   SpO2 97%   BMI 37.94 kg/m   Gen:  66 y.o. female in NAD HEENT: NCAT, MMM, EOMI, PERRL, anicteric sclerae, OP clear without erythema or exudate, TMs clear bilaterally, hoarse voice Neck: Supple, no LAD CV: RRR, no MRG Resp: Non-labored, intermittent expiratory wheeze diffusely, intermittent cough Abd: Soft, NTND, BS present, no guarding or organomegaly Ext: WWP, no edema [id number] intact, diffuse tenderness  over lumbar paraspinal muscles, tenderness over all large muscle groups, poor effort for strength testing, sensation intact Neuro: Alert and oriented, speech normal       Chemistry      Component Value Date/Time   NA 139 03/19/2016 1444   K 3.3 (L) 03/19/2016 1444   CL 102 03/19/2016 1444   CO2 25 03/19/2016 1444   BUN 15 03/19/2016 1444   CREATININE 0.75 03/19/2016 1444      Component Value Date/Time   CALCIUM 9.3 03/19/2016 1444   ALKPHOS 79 03/19/2016 1444   AST 16 03/19/2016 1444   ALT 14 03/19/2016 1444   BILITOT 0.4 03/19/2016 1444      Lab Results  Component Value Date   WBC 6.2 03/19/2016   HGB 13.2 03/19/2016   HCT 39.9 03/19/2016   MCV 83.5 03/19/2016   PLT 166 03/19/2016   Lab Results  Component Value Date   TSH 0.89 03/19/2016   Lab Results  Component Value Date   HGBA1C 7.8 05/01/2016   Assessment & Plan:     [name] [name] is a 66 y.o. female here for   COPD (chronic obstructive pulmonary disease) ([place]) Uncontrolled Recent exacerbation seems to be resolved with no hypoxia or adventitious lung sounds today Patient is afebrile and well appearing despite continued cough Continue albuterol nebulizers as needed Will complete prior authorization for controller medication Follow-up in  one month Return precautions discussed  Left-sided low back pain with left-sided sciatica No red flags Exam is nonrevealing with some paraspinal muscular tenderness Patient with poor effort in strength testing due to pain Patient has been referred to pain management multiple times Continue Lyrica at current dose Switch amitriptyline to nortriptyline and increase dose to 50 mg daily at bedtime to hopefully improve her chronic pain regimen in the meantime  Fibromyalgia Some of the pain and tenderness the patient is exhibiting is likely related to fibromyalgia Continue Lyrica Recommend against opiate therapy Nortriptyline as above Attempting to have patient see pain  management     Virginia Crews, MD MPH PGY-3,  [facility] 05/28/2016  9:07 PM

## 2016-05-28 MED ORDER — FLUTICASONE-SALMETEROL 250-50 MCG/DOSE IN AEPB: 1.0000 | INHALATION_SPRAY | Freq: Two times a day (BID) | RESPIRATORY_TRACT | 3 refills | Status: DC

## 2016-05-28 NOTE — Assessment & Plan Note (Signed)
Some of the pain and tenderness the patient is exhibiting is likely related to fibromyalgia Continue Lyrica Recommend against opiate therapy Nortriptyline as above Attempting to have patient see pain management

## 2016-05-28 NOTE — Telephone Encounter (Signed)
Prior authorization was denied for [facility]. Appears that Advair is on formulary. New prescription sent for this medication.  Virginia Crews, MD, MPH PGY-3,  [facility] 05/28/2016 5:25 PM

## 2016-05-28 NOTE — Telephone Encounter (Signed)
PA was denied for [facility] via [place]. Denial placed in provider box for review.  Reference number: [id number].  Derl Barrow, RN

## 2016-05-28 NOTE — Assessment & Plan Note (Signed)
Uncontrolled Recent exacerbation seems to be resolved with no hypoxia or adventitious lung sounds today Patient is afebrile and well appearing despite continued cough Continue albuterol nebulizers as needed Will complete prior authorization for controller medication Follow-up in one month Return precautions discussed

## 2016-05-28 NOTE — Addendum Note (Signed)
Addended by: Virginia Crews on: 05/28/2016 05:25 PM   Modules accepted: Orders

## 2016-05-28 NOTE — Assessment & Plan Note (Signed)
No red flags Exam is nonrevealing with some paraspinal muscular tenderness Patient with poor effort in strength testing due to pain Patient has been referred to pain management multiple times Continue Lyrica at current dose Switch amitriptyline to nortriptyline and increase dose to 50 mg daily at bedtime to hopefully improve her chronic pain regimen in the meantime

## 2016-06-16 ENCOUNTER — Telehealth: Payer: Self-pay | Admitting: Family Medicine

## 2016-06-16 NOTE — Telephone Encounter (Signed)
Called patient to schedule in TOPc clinic on 06/19/16.  Patient reports that she will be available for the 3:00 appointment. Kelly please schedule her in the slot? Thanks  Virginia Crews, MD, MPH PGY-3,  [facility] 06/16/2016 9:29 AM

## 2016-06-19 ENCOUNTER — Ambulatory Visit (INDEPENDENT_AMBULATORY_CARE_PROVIDER_SITE_OTHER): Payer: Medicare Other | Admitting: Family Medicine

## 2016-06-19 VITALS — BP 152/84 | HR 123 | Temp 98.7°F | Ht 63.0 in | Wt 206.0 lb

## 2016-06-19 DIAGNOSIS — K8500 Idiopathic acute pancreatitis without necrosis or infection: Secondary | ICD-10-CM

## 2016-06-19 DIAGNOSIS — E1142 Type 2 diabetes mellitus with diabetic polyneuropathy: Secondary | ICD-10-CM

## 2016-06-19 DIAGNOSIS — E1143 Type 2 diabetes mellitus with diabetic autonomic (poly)neuropathy: Secondary | ICD-10-CM | POA: Diagnosis not present

## 2016-06-19 DIAGNOSIS — Z79899 Other long term (current) drug therapy: Secondary | ICD-10-CM | POA: Diagnosis not present

## 2016-06-19 DIAGNOSIS — Z7189 Other specified counseling: Secondary | ICD-10-CM

## 2016-06-19 DIAGNOSIS — E1169 Type 2 diabetes mellitus with other specified complication: Secondary | ICD-10-CM | POA: Diagnosis not present

## 2016-06-19 DIAGNOSIS — G894 Chronic pain syndrome: Secondary | ICD-10-CM | POA: Diagnosis not present

## 2016-06-19 DIAGNOSIS — I10 Essential (primary) hypertension: Secondary | ICD-10-CM | POA: Diagnosis not present

## 2016-06-19 DIAGNOSIS — K859 Acute pancreatitis without necrosis or infection, unspecified: Secondary | ICD-10-CM

## 2016-06-19 DIAGNOSIS — K861 Other chronic pancreatitis: Secondary | ICD-10-CM | POA: Diagnosis not present

## 2016-06-19 DIAGNOSIS — M797 Fibromyalgia: Secondary | ICD-10-CM | POA: Diagnosis not present

## 2016-06-19 DIAGNOSIS — E785 Hyperlipidemia, unspecified: Secondary | ICD-10-CM | POA: Diagnosis not present

## 2016-06-19 DIAGNOSIS — K3184 Gastroparesis: Secondary | ICD-10-CM

## 2016-06-19 LAB — GLUCOSE, POCT (MANUAL RESULT ENTRY): POC Glucose: 168 mg/dl — AB (ref 70–99)

## 2016-06-19 MED ORDER — ONDANSETRON HCL 4 MG PO TABS: 4.0000 mg | ORAL_TABLET | Freq: Three times a day (TID) | ORAL | 1 refills | Status: DC | PRN

## 2016-06-19 MED ORDER — GABAPENTIN 100 MG PO CAPS: 100.0000 mg | ORAL_CAPSULE | Freq: Three times a day (TID) | ORAL | 3 refills | Status: DC

## 2016-06-19 NOTE — Patient Instructions (Addendum)
Nice to see you again today.  For your nausea, start taking your Reglan 10 mg 3 times a day again. You can stop the erythromycin, pantoprazole, Zantac until we follow up again. You can use Zofran as needed for nausea and vomiting.  For your diabetes, restart taking Lantus at 10 units daily. Do not adjust the dose yourself. We'll follow-up on this at an upcoming visit.  Keep a log of your blood sugar from first thing in the morning before you eat anything. And bring this to your next visit.  For your pain, since her insurance will not cover Lyrica, we will try gabapentin instead. You will take 100 mg 3 times a day.  For your blood pressure, it is only slightly elevated without any of your medications. You can stop your HCTZ, amlodipine, spironolactone, nadolol. You can restart lisinopril at 20 mg daily. We will adjust as necessary at an upcoming visit.  We are getting some labs today and someone will call you or send you a letter with the results when they're available.  Please bring all of your medications to all of your visits.  You can call when you run out of medications instead of going without them.  Your next appointment is on Tuesday 06/24/16.  Take care, Dr. Jacinto Reap

## 2016-06-19 NOTE — Progress Notes (Signed)
Subjective:   Averee Hochhalter is a 66 y.o. female with a history of HTN, CAD, HFpEF, GERF, T2DM with gastroparesis and periph neuropathy, chronic pancreatitis here for TOPc clinic and care coordination  Assessed strengths, assets, next of kin, goals and updated in appropriate sections of the chart  Polypharmacy - extensive medication review - did not bring medications to this visit - has not taken anyt medications in the last 5-7 days due to N/V - in addition to extensive med list in chart, patient also reports taking spironolactone - she has also run out of several medications - did not call for refill to office - upset that PCP has not refilled - reports insurance has refused to pay for lyrica and advair  HTN - prescribed 5 different antiHTN meds - coreg, spiro, HCTZ, lisinopril, and amlodipine - not taking any x5-7 days  T2DM - Rx for lantus 40 units BID - taking varying amounts - sometimes none at all - often takes lantus 60 units qAM and 20 units qPM - Rx for humalog 15 units BID with meals - thinks she has Novolog - does not take when not eating  N/V/epigastric abd pain - x1 week - worsening - wonders if some of her meds may be causing it - stopped reglan and erythromycin (rx for gastroparesis)  Fibromyalgia/chronic pain/periph neuropathy - out of lyrica x3 wks - reports insurance will not cover it - wants pain to stop completely   Review of Systems:  Per HPI.   Social History: current smoker  Objective:  BP (!) 152/84   Pulse (!) 123   Temp 98.7 F (37.1 C) (Oral)   Ht 5\' 3"  (1.6 m)   Wt 206 lb (93.4 kg)   BMI 36.49 kg/m   Gen:  66 y.o. female in NAD HEENT: NCAT, MMM, anicteric sclerae CV: Reg rate Resp: Non-labored, on RA Abd: No distention noted MSK: moves all extremities, gait slow Neuro: Alert and oriented, speech normal       Chemistry      Component Value Date/Time   NA 139 03/19/2016 1444   K 3.3 (L) 03/19/2016 1444   CL 102 03/19/2016  1444   CO2 25 03/19/2016 1444   BUN 15 03/19/2016 1444   CREATININE 0.75 03/19/2016 1444      Component Value Date/Time   CALCIUM 9.3 03/19/2016 1444   ALKPHOS 79 03/19/2016 1444   AST 16 03/19/2016 1444   ALT 14 03/19/2016 1444   BILITOT 0.4 03/19/2016 1444      Lab Results  Component Value Date   WBC 6.2 03/19/2016   HGB 13.2 03/19/2016   HCT 39.9 03/19/2016   MCV 83.5 03/19/2016   PLT 166 03/19/2016   Lab Results  Component Value Date   TSH 0.89 03/19/2016   Lab Results  Component Value Date   HGBA1C 7.8 05/01/2016   Assessment & Plan:     Amielia Ramson is a 66 y.o. female here for complex care coordination  Essential hypertension Uncontrolled today Not taking any meds x5-7 days Has been prescribed coreg, spironolactone, HCTZ, lisinopril, amlodipine Stop all except lisinopril 20mg  daily F/u next week Check BMP  Pancreatitis, recurrent Some of her abd pain may be related to pancreatitis Zofran prn nausea F/u next week  Diabetic gastroparesis associated with type 2 diabetes mellitus Uncontrolled while off all medications Likely contributing to N/V/abd pain Resume reglan 10mg  TID Hold erythromycin - option to resume in future zofran prn nausea  Dyslipidemia associated with type  2 diabetes mellitus Patient thinks she is taking Lipitor Crestor Rx in chart To bring meds to next visit  DM type 2 with diabetic peripheral neuropathy ([facility]) CBG 167 without any insulin or DM meds x1wk Resume Lantus 10 units daily  Titrate at f/u in 1 wk  Polypharmacy Many medications held as above as patient is not very poorly controlled without them Continue to reassess Consider pharmacy assistance  Fibromyalgia Stop Lyrica and switch to gabapentin 100mg  TID as Lyrica is not covered by insurance Titrate up as tolerated at upcoming visits  Chronic pain syndrome See plan above   Virginia Crews, MD MPH PGY-3,  [facility] 06/20/2016  4:51 PM

## 2016-06-20 ENCOUNTER — Other Ambulatory Visit: Payer: Self-pay | Admitting: Family Medicine

## 2016-06-20 ENCOUNTER — Encounter: Payer: Self-pay | Admitting: Family Medicine

## 2016-06-20 ENCOUNTER — Telehealth: Payer: Self-pay | Admitting: Family Medicine

## 2016-06-20 DIAGNOSIS — Z7189 Other specified counseling: Secondary | ICD-10-CM | POA: Insufficient documentation

## 2016-06-20 NOTE — Assessment & Plan Note (Signed)
Many medications held as above as patient is not very poorly controlled without them Continue to reassess Consider pharmacy assistance

## 2016-06-20 NOTE — Telephone Encounter (Signed)
2nd request for Zofran. Pt's gabapentin was called into the wrong pharmacy, pt needs it go to [facility]. ep

## 2016-06-20 NOTE — Assessment & Plan Note (Signed)
Some of her abd pain may be related to pancreatitis Zofran prn nausea F/u next week

## 2016-06-20 NOTE — Telephone Encounter (Signed)
Pt needs a refill on Zofran. Pt uses [facility] on [address]. ep

## 2016-06-20 NOTE — Assessment & Plan Note (Signed)
CBG 167 without any insulin or DM meds x1wk Resume Lantus 10 units daily  Titrate at f/u in 1 wk

## 2016-06-20 NOTE — Assessment & Plan Note (Signed)
See plan above.

## 2016-06-20 NOTE — Assessment & Plan Note (Signed)
Uncontrolled while off all medications Likely contributing to N/V/abd pain Resume reglan 10mg  TID Hold erythromycin - option to resume in future zofran prn nausea

## 2016-06-20 NOTE — Assessment & Plan Note (Signed)
Patient thinks she is taking Lipitor Crestor Rx in chart To bring meds to next visit

## 2016-06-20 NOTE — Telephone Encounter (Signed)
The number I dialed is not in service.  Please let her know that the zofran and gabapentin were [facility].  If she would liked them switched she can call [facility] and have them switch the Rx to [place] [facility]. Veronique Warga, Salome Spotted, CMA

## 2016-06-20 NOTE — Assessment & Plan Note (Addendum)
Uncontrolled today Not taking any meds x5-7 days Has been prescribed coreg, spironolactone, HCTZ, lisinopril, amlodipine Stop all except lisinopril 20mg  daily F/u next week Check BMP

## 2016-06-20 NOTE — Assessment & Plan Note (Signed)
Stop Lyrica and switch to gabapentin 100mg  TID as Lyrica is not covered by insurance Titrate up as tolerated at upcoming visits

## 2016-06-21 LAB — BASIC METABOLIC PANEL
BUN/Creatinine Ratio: 24 (ref 12–28)
BUN: 25 mg/dL (ref 8–27)
CO2: 23 mmol/L (ref 18–29)
Calcium: 9.9 mg/dL (ref 8.7–10.3)
Chloride: 103 mmol/L (ref 96–106)
Creatinine, Ser: 1.06 mg/dL — ABNORMAL HIGH (ref 0.57–1.00)
GFR calc Af Amer: 63 mL/min/{1.73_m2} (ref >59)
GFR calc non Af Amer: 54 mL/min/{1.73_m2} — ABNORMAL LOW (ref >59)
Glucose: 177 mg/dL — ABNORMAL HIGH (ref 65–99)
Potassium: 4.9 mmol/L (ref 3.5–5.2)
Sodium: 134 mmol/L (ref 134–144)

## 2016-06-24 ENCOUNTER — Ambulatory Visit: Payer: Medicare Other | Admitting: Family Medicine

## 2016-06-24 ENCOUNTER — Telehealth: Payer: Self-pay | Admitting: Family Medicine

## 2016-06-24 NOTE — Telephone Encounter (Signed)
Patient not happy they had to reschedule today and wanted same doctor so next appointment not till 07/08/16.  Patient needs meds refilled/approved asap.  Patient says she has waited for a couple hours before to see the doctor and is not happy she has to reschedule today. She is wanting to change doctors. Thanks/ls

## 2016-06-24 NOTE — Telephone Encounter (Signed)
Per front office staff, patient is planning to fill out form for switching PCP.  Virginia Crews, MD, MPH PGY-3,  [facility] 06/24/2016 2:36 PM

## 2016-07-07 ENCOUNTER — Other Ambulatory Visit: Payer: Self-pay | Admitting: Family Medicine

## 2016-07-08 ENCOUNTER — Encounter: Payer: Self-pay | Admitting: Family Medicine

## 2016-07-08 ENCOUNTER — Ambulatory Visit (INDEPENDENT_AMBULATORY_CARE_PROVIDER_SITE_OTHER): Payer: Medicare Other | Admitting: Family Medicine

## 2016-07-08 VITALS — BP 182/90 | HR 104 | Temp 99.2°F | Ht 63.0 in | Wt 212.0 lb

## 2016-07-08 DIAGNOSIS — I10 Essential (primary) hypertension: Secondary | ICD-10-CM

## 2016-07-08 DIAGNOSIS — E1142 Type 2 diabetes mellitus with diabetic polyneuropathy: Secondary | ICD-10-CM

## 2016-07-08 DIAGNOSIS — E1169 Type 2 diabetes mellitus with other specified complication: Secondary | ICD-10-CM

## 2016-07-08 DIAGNOSIS — E785 Hyperlipidemia, unspecified: Secondary | ICD-10-CM

## 2016-07-08 DIAGNOSIS — E1100 Type 2 diabetes mellitus with hyperosmolarity without nonketotic hyperglycemic-hyperosmolar coma (NKHHC): Secondary | ICD-10-CM

## 2016-07-08 DIAGNOSIS — K3184 Gastroparesis: Secondary | ICD-10-CM

## 2016-07-08 DIAGNOSIS — J3089 Other allergic rhinitis: Secondary | ICD-10-CM

## 2016-07-08 DIAGNOSIS — J302 Other seasonal allergic rhinitis: Secondary | ICD-10-CM | POA: Diagnosis not present

## 2016-07-08 DIAGNOSIS — E1143 Type 2 diabetes mellitus with diabetic autonomic (poly)neuropathy: Secondary | ICD-10-CM | POA: Diagnosis not present

## 2016-07-08 DIAGNOSIS — M797 Fibromyalgia: Secondary | ICD-10-CM

## 2016-07-08 DIAGNOSIS — K219 Gastro-esophageal reflux disease without esophagitis: Secondary | ICD-10-CM

## 2016-07-08 DIAGNOSIS — J449 Chronic obstructive pulmonary disease, unspecified: Secondary | ICD-10-CM

## 2016-07-08 DIAGNOSIS — K8500 Idiopathic acute pancreatitis without necrosis or infection: Secondary | ICD-10-CM | POA: Diagnosis not present

## 2016-07-08 DIAGNOSIS — K861 Other chronic pancreatitis: Secondary | ICD-10-CM

## 2016-07-08 DIAGNOSIS — K859 Acute pancreatitis without necrosis or infection, unspecified: Secondary | ICD-10-CM

## 2016-07-08 MED ORDER — ROSUVASTATIN CALCIUM 20 MG PO TABS: 20.0000 mg | ORAL_TABLET | Freq: Every day | ORAL | 0 refills | Status: DC

## 2016-07-08 MED ORDER — CARVEDILOL 12.5 MG PO TABS: 12.5000 mg | ORAL_TABLET | Freq: Two times a day (BID) | ORAL | 2 refills | Status: DC

## 2016-07-08 MED ORDER — FLUTICASONE PROPIONATE 50 MCG/ACT NA SUSP: 2.0000 | Freq: Every day | NASAL | 6 refills | Status: DC

## 2016-07-08 MED ORDER — ONDANSETRON HCL 4 MG PO TABS: 4.0000 mg | ORAL_TABLET | Freq: Three times a day (TID) | ORAL | 1 refills | Status: DC | PRN

## 2016-07-08 MED ORDER — RANITIDINE HCL 150 MG PO TABS: 150.0000 mg | ORAL_TABLET | Freq: Every day | ORAL | 5 refills | Status: DC | PRN

## 2016-07-08 MED ORDER — INSULIN GLARGINE 100 UNIT/ML SOLOSTAR PEN: 20.0000 [IU] | PEN_INJECTOR | Freq: Every day | SUBCUTANEOUS | 99 refills | Status: DC

## 2016-07-08 MED ORDER — LISINOPRIL 40 MG PO TABS
40.0000 mg | ORAL_TABLET | Freq: Every day | ORAL | 11 refills | Status: DC
Start: 2016-07-08 — End: 2016-11-06

## 2016-07-08 MED ORDER — PREGABALIN 150 MG PO CAPS: 150.0000 mg | ORAL_CAPSULE | Freq: Three times a day (TID) | ORAL | 3 refills | Status: DC

## 2016-07-08 MED ORDER — OLOPATADINE HCL 0.2 % OP SOLN: 1.0000 [drp] | Freq: Two times a day (BID) | OPHTHALMIC | 6 refills | Status: DC

## 2016-07-08 MED ORDER — CETIRIZINE HCL 10 MG PO TABS: 10.0000 mg | ORAL_TABLET | Freq: Every day | ORAL | 5 refills | Status: DC

## 2016-07-08 NOTE — Patient Instructions (Signed)
Nice to see you again today. Your blood pressure is elevated today. Increase her lisinopril dose to 40 mg daily. Resume carvedilol 12.5 mg twice a day. Resume your daily aspirin.  Take 20 units of Lantus daily. Continue to keep a log of your morning blood sugars.  For your allergies and congestion, we will resume your Pataday eyedrops, Zyrtec, Flonase.  For your COPD, please make an appointment with Dr. Valentina Lucks for pulmonary function tests. You have not been on any medications for the COPD, and I would like to see if you truly need them.  For your abdominal pain and nausea and vomiting, make a follow-up appointment with your gastroenterologist. Using continue the Zofran and Reglan. You can resume the ranitidine daily.  For your pain, I sent a new prescription for the Lyrica. Do not take this with your gabapentin.  Take care, Dr. Jacinto Reap

## 2016-07-08 NOTE — Progress Notes (Signed)
Subjective:   [name] [name] is a 66 y.o. female with a history of CAD, HTN, diastolic heart failure, COPD, T2 DM with gastroparesis and peripheral neuropathy here for hypertension, diabetes, gastroparesis follow-up  HTN: - Medications: Currently only taking lisinopril 20 mg daily - Patient as previously prescribed 5 different antihypertensive medications including Coreg, spironolactone, HCTZ, lisinopril, amlodipine-all at full dose  - However at last visit, patient had not been taking any of these for about 1 week and her blood pressure was not very elevated, so lisinopril 20 mg daily was resumed and none of the others - Compliance: Good - Checking BP at home: No - Denies any SOB, CP, vision changes, LE edema, medication SEs, or symptoms of hypotension - Diet: very little, nothing, chicken and beef broth, toast    - saw GI in hospital in 12/2015 [facility]) - was diagnosed with Candida esophagitis and completed her course of Diflucan  - She reports ongoing dysphasia and anorexia - Exercise: none  T2DM - Lantus 10 units daily resumed at last visit (she previously been taking Lantus 40 units BID and Humalog 15 units BIDac, but has been off of this for about 1 week at last visit and her CBG was 167)  -  she reports that she has self increased to 20 BID (3 days ago) Because she saw 1 fasting blood sugar of 300 and got worried about it - she has had intermittent symptoms of hypoglycemia since increasing her Lantus so drastically - Fasting CBGs 190-300 on 10 units daily  N/V/epigastric abd pain - stable and unchanged from last visit - At last visit, patient had not been taking her Reglan or erythromycin - Reglan was resumed and Zofran was prescribed when necessary - Her nausea is improved while taking the Zofran, which she is taking regularly every 6 hours, which is how often she feels like she needs it - No further vomiting - She reports the pain is similar to her pancreatitis pain, that is  chronic in nature  Fibromyalgia/chronic pain/periph neuropathy -At last visit, she had run out of her Lyrica about 3 weeks prior and was reporting the insurance would not cover it - she was prescribed gabapentin 100 mg 3 times a day in lieu of Lyrica - She reports she only took this for about 4 days, and self discontinued it as it made her feel nervous and have insomnia and shaking - seemed to help leg pain initially, but then worsened again - She reports that she "needs her Lyrica back "  Congestion, SOB, allergies, eyes itching - x2 wks - Green mucuous and streaks of blood from nose intermittently (better while on zyrtec) - Taking zyrtec x3-4 days - helping - Used to be on flonase - that seemed to help - Out of pataday eye drops, so she is using any eyedrops that she can find to sue the scratchiness that she feels - She has at least 10 bottles of different eyedrops, including prescription and over-the-counter medications in her medicine bag - She sees Dr. Katy [name] for ophthalmology care   Review of Systems:  Per HPI.   Social History: current smoker  Objective:  BP (!) 182/90   Pulse (!) 104   Temp 99.2 F (37.3 C) (Oral)   Ht 5\' 3"  (1.6 m)   Wt 212 lb (96.2 kg)   BMI 37.55 kg/m   Gen:  66 y.o. female in NAD, sitting comfortably HEENT: NCAT, MMM, EOMI, PERRL, anicteric sclerae, OP clear  CV: RRR,  no MRG Resp: Non-labored, CTAB, no wheezes noted Abd: Soft, diffusely mildly tender, ND, BS present, no guarding or organomegaly Ext: WWP, no edema MSK:No obvious deformities, gait intact  Neuro: Alert and oriented, speech normal Psych: Appropriate grooming and dress, no evidence of responding to internal stimuli, agitated       Chemistry      Component Value Date/Time   NA 142 07/08/2016 1040   K 4.0 07/08/2016 1040   CL 101 07/08/2016 1040   CO2 24 07/08/2016 1040   BUN 10 07/08/2016 1040   CREATININE 0.84 07/08/2016 1040   CREATININE 0.75 03/19/2016 1444        Component Value Date/Time   CALCIUM 9.1 07/08/2016 1040   ALKPHOS 79 03/19/2016 1444   AST 16 03/19/2016 1444   ALT 14 03/19/2016 1444   BILITOT 0.4 03/19/2016 1444      Lab Results  Component Value Date   WBC 6.2 03/19/2016   HGB 13.2 03/19/2016   HCT 39.9 03/19/2016   MCV 83.5 03/19/2016   PLT 166 03/19/2016   Lab Results  Component Value Date   TSH 0.89 03/19/2016   Lab Results  Component Value Date   HGBA1C 7.8 05/01/2016   Assessment & Plan:     [name] [name] is a 66 y.o. female here for   Essential hypertension Uncontrolled Today Increase lisinopril to 40 mg daily Resume Coreg at lower dose of 12.5 mg twice a day as her pulse is also elevated Resume baby aspirin daily Recheck BMP as lisinopril was resumed at last visit Continue to hold HCTZ, amlodipine, spironolactone Follow-up in 2 weeks  COPD (chronic obstructive pulmonary disease) ([place]) Patient with several recent exacerbations No formal PFTs, chart review Though patient has not gotten any controller medications, her breathing is the most well-controlled that I have seen it ever Wonder if this could be some upper airway spasm issue that has caused hoarse voice and noisy breathing previously Continue albuterol nebulizers as needed Advised patient to follow-up with pharmacy clinic for formal PFTs Plan for controller medication pending PFTs  Allergic rhinitis Patient with exacerbated symptoms of allergic rhinitis off of all antihistamines Resume Zyrtec, Flonase, Pataday eyedrops Of note, patient has at least 10 different bottles of eyedrops in her medicine bag Advised her to follow-up with her ophthalmologist about her dry eyes and the many different eyedrops that she is using when necessary  Pancreatitis, recurrent Some of her anorexia and diffuse abdominal pain may be related to her chronic pancreatitis See plan below  GASTROESOPHAGEAL REFLUX DISEASE, CHRONIC GERD may be contributing to some of  the patient's anorexia and abdominal pain Resume ranitidine  Diabetic gastroparesis associated with type 2 diabetes mellitus Likely contributing to her nausea, vomiting, abdominal pain Continue Reglan Continue Zofran as needed for nausea Advised patient not to take Zofran anymore than 3 times daily Advised patient that she needs a follow-up with her gastroenterologist for her many GI problems including gastroparesis, GERD, recent Candida esophagitis, chronic pancreatitis Would discuss with gastroenterology whether prolonged use of Zofran is advisable Would avoid anticholinergic medications as these may worsen her slow GI transit time  Dyslipidemia associated with type 2 diabetes mellitus Previously, patient had reported taking Lipitor, though Crestor was prescribed previously She does not have either of these statins in her medicine back today Asked her to resume taking Crestor, new prescription sent to pharmacy  DM type 2 with diabetic peripheral neuropathy ([place]) Fasting CBGs remained elevated Advised patient that she should not self titrated  her Lantus and then increasing from 10 units daily to 40 units daily is extreme and dangerous She's been having symptoms of hypoglycemia since increasing her dose so drastically She will take Lantus 20 units daily and keep log of fasting blood glucoses to bring to her next appointment We will continue to titrate at her next follow-up in 2 weeks  Diabetic peripheral neuropathy Poorly controlled Patient stopped taking gabapentin after only 3-4 days, because she thinks it made it difficult for her to sleep We will resume Lyrica- advised patient that prior authorization may be necessary, so this medication may take some time to get  Fibromyalgia Patient stopped gabapentin as above She was previously taking Lyrica 150 mg in the morning, 150 mg in the afternoon, 300 mg at bedtime. This is above the total daily recommended dose We will resume Lyrica  150 mg 3 times a day   Virginia Crews, MD MPH PGY-3,  [facility] 07/09/2016  10:09 AM

## 2016-07-09 ENCOUNTER — Encounter: Payer: Self-pay | Admitting: Family Medicine

## 2016-07-09 LAB — BASIC METABOLIC PANEL
BUN/Creatinine Ratio: 12 (ref 12–28)
BUN: 10 mg/dL (ref 8–27)
CO2: 24 mmol/L (ref 18–29)
Calcium: 9.1 mg/dL (ref 8.7–10.3)
Chloride: 101 mmol/L (ref 96–106)
Creatinine, Ser: 0.84 mg/dL (ref 0.57–1.00)
GFR calc Af Amer: 83 mL/min/{1.73_m2} (ref >59)
GFR calc non Af Amer: 72 mL/min/{1.73_m2} (ref >59)
Glucose: 177 mg/dL — ABNORMAL HIGH (ref 65–99)
Potassium: 4 mmol/L (ref 3.5–5.2)
Sodium: 142 mmol/L (ref 134–144)

## 2016-07-09 NOTE — Assessment & Plan Note (Signed)
Previously, patient had reported taking Lipitor, though Crestor was prescribed previously She does not have either of these statins in her medicine back today Asked her to resume taking Crestor, new prescription sent to pharmacy

## 2016-07-09 NOTE — Assessment & Plan Note (Signed)
Some of her anorexia and diffuse abdominal pain may be related to her chronic pancreatitis See plan below

## 2016-07-09 NOTE — Assessment & Plan Note (Signed)
Likely contributing to her nausea, vomiting, abdominal pain Continue Reglan Continue Zofran as needed for nausea Advised patient not to take Zofran anymore than 3 times daily Advised patient that she needs a follow-up with her gastroenterologist for her many GI problems including gastroparesis, GERD, recent Candida esophagitis, chronic pancreatitis Would discuss with gastroenterology whether prolonged use of Zofran is advisable Would avoid anticholinergic medications as these may worsen her slow GI transit time

## 2016-07-09 NOTE — Assessment & Plan Note (Signed)
Patient stopped gabapentin as above She was previously taking Lyrica 150 mg in the morning, 150 mg in the afternoon, 300 mg at bedtime. This is above the total daily recommended dose We will resume Lyrica 150 mg 3 times a day

## 2016-07-09 NOTE — Assessment & Plan Note (Signed)
Uncontrolled Today Increase lisinopril to 40 mg daily Resume Coreg at lower dose of 12.5 mg twice a day as her pulse is also elevated Resume baby aspirin daily Recheck BMP as lisinopril was resumed at last visit Continue to hold HCTZ, amlodipine, spironolactone Follow-up in 2 weeks

## 2016-07-09 NOTE — Assessment & Plan Note (Addendum)
Patient with exacerbated symptoms of allergic rhinitis off of all antihistamines Resume Zyrtec, Flonase, Pataday eyedrops Of note, patient has at least 10 different bottles of eyedrops in her medicine bag Advised her to follow-up with her ophthalmologist about her dry eyes and the many different eyedrops that she is using when necessary

## 2016-07-09 NOTE — Assessment & Plan Note (Signed)
GERD may be contributing to some of the patient's anorexia and abdominal pain Resume ranitidine

## 2016-07-09 NOTE — Assessment & Plan Note (Signed)
Patient with several recent exacerbations No formal PFTs, chart review Though patient has not gotten any controller medications, her breathing is the most well-controlled that I have seen it ever Wonder if this could be some upper airway spasm issue that has caused hoarse voice and noisy breathing previously Continue albuterol nebulizers as needed Advised patient to follow-up with pharmacy clinic for formal PFTs Plan for controller medication pending PFTs

## 2016-07-09 NOTE — Assessment & Plan Note (Signed)
Poorly controlled Patient stopped taking gabapentin after only 3-4 days, because she thinks it made it difficult for her to sleep We will resume Lyrica- advised patient that prior authorization may be necessary, so this medication may take some time to get

## 2016-07-09 NOTE — Assessment & Plan Note (Addendum)
Fasting CBGs remained elevated Advised patient that she should not self titrated her Lantus and then increasing from 10 units daily to 40 units daily is extreme and dangerous She's been having symptoms of hypoglycemia since increasing her dose so drastically She will take Lantus 20 units daily and keep log of fasting blood glucoses to bring to her next appointment We will continue to titrate at her next follow-up in 2 weeks

## 2016-07-23 ENCOUNTER — Other Ambulatory Visit: Payer: Self-pay | Admitting: Family Medicine

## 2016-07-23 ENCOUNTER — Other Ambulatory Visit: Payer: Self-pay | Admitting: *Deleted

## 2016-07-23 MED ORDER — INSULIN PEN NEEDLE 31G X 8 MM MISC: 99 refills | Status: DC

## 2016-07-23 NOTE — Progress Notes (Signed)
   Subjective:   [name] [name] is a 66 y.o. female with a history of T2 DM with peripheral neuropathy and diabetic gastroparesis, HTN, diastolic CHF, CAD, recurrent pancreatitis, chronic pain here for diabetes and hypertension follow-up  HTN: - Medications: lisinopril 40mg  daily, coreg 12.5mg  BID - Compliance: good - not taking amlodipine, spironolactone, HCTZ - as BP had been low - Checking BP at home: no - Denies any SOB, CP, vision changes, LE edema, medication SEs, or symptoms of hypotension - Diet: "crappy" not eating much of anything, eats a lot of eggs and applesauce, some grits, rare cabbage as veggie - Exercise: walks down long hallway at [facility] that sister is at daily  T2DM - Checking BG at home: yes, cannot remember what they are - list given to pharmacy - Medications: Lantus 20 units BID (self titrate because having shakes and felt cold and thought BG had dropped - up from 20 units daily) - Compliance: self-titrates, but takes BID - Diet: see above - Exercise: see above - eye exam: needs appt with Optho - foot exam: to be done at next visit - microalbumin: n/a ACEi - denies symptoms of hypoglycemia, polyuria, polydipsia, numbness extremities, foot ulcers/trauma - unable to afford Lyrica currently - pharmacy says they are sending new paperwork to be completed  COPD is on problem list - patient reports h/o asthma - normal PFTs this AM - will stop all inhalers - can continue albuterol neb prn  - patient is hesitant to d/c these meds even though she was never able to afford them - she worries about what she will take if she develops congestion or a cough  Review of Systems:  Per HPI.   Social History: current smoker  Objective:  There were no vitals taken for this visit.  Gen:  66 y.o. female in NAD HEENT: NCAT, MMM, anicteric sclerae CV: RRR, no MRG Resp: Non-labored, CTAB, no wheezes noted Ext: WWP, no edema MSK: No obvious deformities, gait intact Neuro: Alert  and oriented, speech normal Psych: flat affect, anxious, appropriate groom and dress       Chemistry      Component Value Date/Time   NA 142 07/08/2016 1040   K 4.0 07/08/2016 1040   CL 101 07/08/2016 1040   CO2 24 07/08/2016 1040   BUN 10 07/08/2016 1040   CREATININE 0.84 07/08/2016 1040   CREATININE 0.75 03/19/2016 1444      Component Value Date/Time   CALCIUM 9.1 07/08/2016 1040   ALKPHOS 79 03/19/2016 1444   AST 16 03/19/2016 1444   ALT 14 03/19/2016 1444   BILITOT 0.4 03/19/2016 1444      Lab Results  Component Value Date   WBC 6.2 03/19/2016   HGB 13.2 03/19/2016   HCT 39.9 03/19/2016   MCV 83.5 03/19/2016   PLT 166 03/19/2016   Lab Results  Component Value Date   TSH 0.89 03/19/2016   Lab Results  Component Value Date   HGBA1C 7.8 05/01/2016   Assessment & Plan:     [name] [name] is a 66 y.o. female here for   No problem-specific Assessment & Plan notes found for this encounter.     Virginia Crews, MD MPH PGY-3,  [facility] 07/23/2016  3:10 PM

## 2016-07-23 NOTE — Telephone Encounter (Signed)
Patient was just given Rx for Lyrica for 1 month supply R3 on 3/20.  Not due for refill.  Virginia Crews, MD, MPH PGY-3,  [facility] 07/23/2016 2:59 PM

## 2016-07-24 ENCOUNTER — Ambulatory Visit (INDEPENDENT_AMBULATORY_CARE_PROVIDER_SITE_OTHER): Payer: Medicare Other | Admitting: Pharmacist

## 2016-07-24 ENCOUNTER — Encounter: Payer: Self-pay | Admitting: Family Medicine

## 2016-07-24 ENCOUNTER — Ambulatory Visit (INDEPENDENT_AMBULATORY_CARE_PROVIDER_SITE_OTHER): Payer: Medicare Other | Admitting: Family Medicine

## 2016-07-24 ENCOUNTER — Encounter: Payer: Self-pay | Admitting: Pharmacist

## 2016-07-24 VITALS — BP 158/80 | HR 78 | Temp 98.8°F | Ht 63.0 in | Wt 210.0 lb

## 2016-07-24 DIAGNOSIS — I10 Essential (primary) hypertension: Secondary | ICD-10-CM

## 2016-07-24 DIAGNOSIS — E1100 Type 2 diabetes mellitus with hyperosmolarity without nonketotic hyperglycemic-hyperosmolar coma (NKHHC): Secondary | ICD-10-CM

## 2016-07-24 DIAGNOSIS — E1142 Type 2 diabetes mellitus with diabetic polyneuropathy: Secondary | ICD-10-CM

## 2016-07-24 DIAGNOSIS — Z79899 Other long term (current) drug therapy: Secondary | ICD-10-CM | POA: Diagnosis not present

## 2016-07-24 DIAGNOSIS — J449 Chronic obstructive pulmonary disease, unspecified: Secondary | ICD-10-CM

## 2016-07-24 MED ORDER — AMLODIPINE BESYLATE 10 MG PO TABS: 10.0000 mg | ORAL_TABLET | Freq: Every day | ORAL | 3 refills | Status: DC

## 2016-07-24 MED ORDER — INSULIN GLARGINE 100 UNIT/ML SOLOSTAR PEN: 25.0000 [IU] | PEN_INJECTOR | Freq: Two times a day (BID) | SUBCUTANEOUS | 1 refills | Status: DC

## 2016-07-24 NOTE — Patient Instructions (Signed)
Nice to see you again today. Your blood pressure is high. Increase your amlodipine to 10 mg daily. As your blood sugars continue to be high, increase her Lantus to 25 units twice a day. I'm giving information to get a mammogram. Please check and see you when you need a new eye exam.  Take care, Dr. Jacinto Reap

## 2016-07-24 NOTE — Progress Notes (Signed)
   S:    Patient arrives in good spirits ambulating without assistance.    Presents for lung function evaluation.   Patient was referred on 07/08/16.  Patient was last seen by Primary Care Provider on 07/08/16.   Reports smoking 4 cigarettes per day.  Reports she has shaking if she decreases her albuterol.  Smokes Marlboro (highest # smoke = 20 cig/day) for 40 years.    Reports pollen makes breathing worse.  Most recent quit attempt was with pneumonia for 5 days. She is not ready to quit at this time due to anxiety.   Patient reports shortness of breath with walking. She reports she does have to sleep on side at night and can't sleep on her back.  She does have to stop and rest when walking on the level.  Cannot afford Dulera, Advair, Albuterol inhaler.   Patient reports last dose of COPD medications was last night.  Using albuterol nebulizer 2x/day.    O: mMRC score= >2  See Documentation Flowsheet - CAT/COPD for complete symptom scoring.  See "scanned report" or Documentation Flowsheet (discrete results - PFTs) for  Spirometry results. Patient provided good effort while attempting spirometry.   Lung Age = 65 years  Fasting CBGs:  200,250, 240, 260, 270, 199, 180, 230, 200, 200, 230, 199, 189 mg/dL   A/P: Spirometry evaluation reveals near normal lung function.  Patient has been experiencing shortness of breath on with walking for several weeks and taking albuterol nebulizers as needed. She was never able to get access to Advair due to copay, however her pulmonary function tests demonstrate that she does not need it at this time. Reviewed results of pulmonary function tests.  Recommended to increase walking and physical activity, which should also help with her lung deconditioning. Pt verbalized understanding of results and education.  Written pt instructions provided.  Patient has appointment with her PCP today following this visit.     Total time in face to face counseling 30 minutes.   Patient seen with Ida Rogue, PharmD Candidate, Demetrius Charity, PharmD PGY-1 Resident and Bennye Alm, PharmD, BCPS, PGY2 Resident.

## 2016-07-24 NOTE — Assessment & Plan Note (Signed)
Spirometry evaluation reveals near normal lung function.  Patient has been experiencing shortness of breath on with walking for several weeks and taking albuterol nebulizers as needed. She was never able to get access to Advair due to copay, however her pulmonary function tests demonstrate that she does not need it at this time. Reviewed results of pulmonary function tests.  Recommended to increase walking and physical activity, which should also help with her lung deconditioning. Pt verbalized understanding of results and education.  Written pt instructions provided.  Patient has appointment with her PCP today following this visit.

## 2016-07-24 NOTE — Patient Instructions (Signed)
Thanks for coming in today.   Shared your lung function results today with Dr. Brita Romp.   She will see you next   No changes today.   Please follow up per guidance from Dr. Brita Romp.

## 2016-07-25 NOTE — Assessment & Plan Note (Signed)
Patient remains hesitant to change any of her medications despite lengthy medication list Appreciate pharmacy assistance Medication list again reviewed and updated Continue to reassess

## 2016-07-25 NOTE — Assessment & Plan Note (Signed)
Uncontrolled today Continue lisinopril, Coreg, aspirin Resume amlodipine Follow-up in one month

## 2016-07-25 NOTE — Progress Notes (Signed)
Patient ID: [name] [name], female   DOB: 04-08-1951, 66 y.o.   MRN: [id number] Reviewed: Agree with Dr. Graylin Shiver documentation and management.

## 2016-07-25 NOTE — Assessment & Plan Note (Signed)
Fasting CBGs remained elevated Advised patient that she should not self titrate her Lantus and then increasing from 20 units to 40 units daily is rapid and dangerous She is denying any recent symptoms of hypoglycemia We will increase her Lantus to 25 units twice a day She will keep a log of fasting blood glucoses and bring them to her next appointment We will continue titrated her next follow-up in 4 weeks

## 2016-07-28 ENCOUNTER — Other Ambulatory Visit: Payer: Self-pay | Admitting: *Deleted

## 2016-07-28 MED ORDER — PREGABALIN 150 MG PO CAPS: 150.0000 mg | ORAL_CAPSULE | Freq: Three times a day (TID) | ORAL | 3 refills | Status: DC

## 2016-07-28 NOTE — Telephone Encounter (Signed)
Please fax to pharmacy.  [name] Crews, MD, MPH PGY-3,  [facility] 07/28/2016 1:30 PM

## 2016-07-29 ENCOUNTER — Ambulatory Visit: Payer: Medicare Other | Admitting: Family Medicine

## 2016-08-04 ENCOUNTER — Encounter: Payer: Self-pay | Admitting: Family Medicine

## 2016-08-17 ENCOUNTER — Other Ambulatory Visit: Payer: Self-pay | Admitting: Family Medicine

## 2016-08-17 DIAGNOSIS — L299 Pruritus, unspecified: Secondary | ICD-10-CM

## 2016-08-28 ENCOUNTER — Encounter: Payer: Self-pay | Admitting: *Deleted

## 2016-08-28 ENCOUNTER — Ambulatory Visit: Payer: Medicare Other | Admitting: *Deleted

## 2016-08-28 ENCOUNTER — Other Ambulatory Visit: Payer: Self-pay | Admitting: *Deleted

## 2016-08-28 ENCOUNTER — Ambulatory Visit (INDEPENDENT_AMBULATORY_CARE_PROVIDER_SITE_OTHER): Payer: Medicare Other | Admitting: *Deleted

## 2016-08-28 ENCOUNTER — Encounter: Payer: Self-pay | Admitting: Family Medicine

## 2016-08-28 ENCOUNTER — Ambulatory Visit (INDEPENDENT_AMBULATORY_CARE_PROVIDER_SITE_OTHER): Payer: Medicare Other | Admitting: Family Medicine

## 2016-08-28 ENCOUNTER — Encounter: Payer: Self-pay | Admitting: Licensed Clinical Social Worker

## 2016-08-28 VITALS — BP 194/84 | HR 76 | Temp 98.9°F | Ht 63.0 in | Wt 205.2 lb

## 2016-08-28 VITALS — BP 194/84 | HR 76 | Temp 98.9°F | Wt 205.0 lb

## 2016-08-28 DIAGNOSIS — E1142 Type 2 diabetes mellitus with diabetic polyneuropathy: Secondary | ICD-10-CM | POA: Diagnosis not present

## 2016-08-28 DIAGNOSIS — R05 Cough: Secondary | ICD-10-CM

## 2016-08-28 DIAGNOSIS — E1143 Type 2 diabetes mellitus with diabetic autonomic (poly)neuropathy: Secondary | ICD-10-CM

## 2016-08-28 DIAGNOSIS — Z0000 Encounter for general adult medical examination without abnormal findings: Secondary | ICD-10-CM | POA: Diagnosis not present

## 2016-08-28 DIAGNOSIS — J302 Other seasonal allergic rhinitis: Secondary | ICD-10-CM

## 2016-08-28 DIAGNOSIS — I10 Essential (primary) hypertension: Secondary | ICD-10-CM

## 2016-08-28 DIAGNOSIS — B372 Candidiasis of skin and nail: Secondary | ICD-10-CM

## 2016-08-28 DIAGNOSIS — L301 Dyshidrosis [pompholyx]: Secondary | ICD-10-CM

## 2016-08-28 DIAGNOSIS — L299 Pruritus, unspecified: Secondary | ICD-10-CM

## 2016-08-28 DIAGNOSIS — R059 Cough, unspecified: Secondary | ICD-10-CM

## 2016-08-28 DIAGNOSIS — G894 Chronic pain syndrome: Secondary | ICD-10-CM

## 2016-08-28 DIAGNOSIS — K3184 Gastroparesis: Secondary | ICD-10-CM

## 2016-08-28 LAB — POCT GLYCOSYLATED HEMOGLOBIN (HGB A1C): Hemoglobin A1C: 7.8

## 2016-08-28 MED ORDER — INSULIN GLARGINE 100 UNIT/ML SOLOSTAR PEN: 27.0000 [IU] | PEN_INJECTOR | Freq: Two times a day (BID) | SUBCUTANEOUS | 1 refills | Status: DC

## 2016-08-28 MED ORDER — HYDROCHLOROTHIAZIDE 25 MG PO TABS: 25.0000 mg | ORAL_TABLET | Freq: Every day | ORAL | 3 refills | Status: DC

## 2016-08-28 NOTE — Patient Instructions (Signed)
 Diabetes and Foot Care Diabetes may cause you to have problems because of poor blood supply (circulation) to your feet and legs. This may cause the skin on your feet to become thinner, break easier, and heal more slowly. Your skin may become dry, and the skin may peel and crack. You may also have nerve damage in your legs and feet causing decreased feeling in them. You may not notice minor injuries to your feet that could lead to infections or more serious problems. Taking care of your feet is one of the most important things you can do for yourself. Follow these instructions at home:  Wear shoes at all times, even in the house. Do not go barefoot. Bare feet are easily injured.  Check your feet daily for blisters, cuts, and redness. If you cannot see the bottom of your feet, use a mirror or ask someone for help.  Wash your feet with warm water (do not use hot water) and mild soap. Then pat your feet and the areas between your toes until they are completely dry. Do not soak your feet as this can dry your skin.  Apply a moisturizing lotion or petroleum jelly (that does not contain alcohol and is unscented) to the skin on your feet and to dry, brittle toenails. Do not apply lotion between your toes.  Trim your toenails straight across. Do not dig under them or around the cuticle. File the edges of your nails with an emery board or nail file.  Do not cut corns or calluses or try to remove them with medicine.  Wear clean socks or stockings every day. Make sure they are not too tight. Do not wear knee-high stockings since they may decrease blood flow to your legs.  Wear shoes that fit properly and have enough cushioning. To break in new shoes, wear them for just a few hours a day. This prevents you from injuring your feet. Always look in your shoes before you put them on to be sure there are no objects inside.  Do not cross your legs. This may decrease the blood flow to your feet.  If you find a  minor scrape, cut, or break in the skin on your feet, keep it and the skin around it clean and dry. These areas may be cleansed with mild soap and water. Do not cleanse the area with peroxide, alcohol, or iodine.  When you remove an adhesive bandage, be sure not to damage the skin around it.  If you have a wound, look at it several times a day to make sure it is healing.  Do not use heating pads or hot water bottles. They may burn your skin. If you have lost feeling in your feet or legs, you may not know it is happening until it is too late.  Make sure your health care provider performs a complete foot exam at least annually or more often if you have foot problems. Report any cuts, sores, or bruises to your health care provider immediately. Contact a health care provider if:  You have an injury that is not healing.  You have cuts or breaks in the skin.  You have an ingrown nail.  You notice redness on your legs or feet.  You feel burning or tingling in your legs or feet.  You have pain or cramps in your legs and feet.  Your legs or feet are numb.  Your feet always feel cold. Get help right away if:  There is   increasing redness, swelling, or pain in or around a wound.  There is a red line that goes up your leg.  Pus is coming from a wound.  You develop a fever or as directed by your health care provider.  You notice a bad smell coming from an ulcer or wound. This information is not intended to replace advice given to you by your health care provider. Make sure you discuss any questions you have with your health care provider. Document Released: 04/04/2000 Document Revised: 09/13/2015 Document Reviewed: 09/14/2012 Elsevier Interactive Patient Education  2017 [place] Prevention in the Home Falls can cause injuries. They can happen to people of all ages. There are many things you can do to make your home safe and to help prevent falls. What can I do on the  outside of my home?  Regularly fix the edges of walkways and driveways and fix any cracks.  Remove anything that might make you trip as you walk through a door, such as a raised step or threshold.  Trim any bushes or trees on the path to your home.  Use bright outdoor lighting.  Clear any walking paths of anything that might make someone trip, such as rocks or tools.  Regularly check to see if handrails are loose or broken. Make sure that both sides of any steps have handrails.  Any raised decks and porches should have guardrails on the edges.  Have any leaves, snow, or ice cleared regularly.  Use sand or salt on walking paths during winter.  Clean up any spills in your garage right away. This includes oil or grease spills. What can I do in the bathroom?  Use night lights.  Install grab bars by the toilet and in the tub and shower. Do not use towel bars as grab bars.  Use non-skid mats or decals in the tub or shower.  If you need to sit down in the shower, use a plastic, non-slip stool.  Keep the floor dry. Clean up any water that spills on the floor as soon as it happens.  Remove soap buildup in the tub or shower regularly.  Attach bath mats securely with double-sided non-slip rug tape.  Do not have throw rugs and other things on the floor that can make you trip. What can I do in the bedroom?  Use night lights.  Make sure that you have a light by your bed that is easy to reach.  Do not use any sheets or blankets that are too big for your bed. They should not hang down onto the floor.  Have a firm chair that has side arms. You can use this for support while you get dressed.  Do not have throw rugs and other things on the floor that can make you trip. What can I do in the kitchen?  Clean up any spills right away.  Avoid walking on wet floors.  Keep items that you use a lot in easy-to-reach places.  If you need to reach something above you, use a strong step  stool that has a grab bar.  Keep electrical cords out of the way.  Do not use floor polish or wax that makes floors slippery. If you must use wax, use non-skid floor wax.  Do not have throw rugs and other things on the floor that can make you trip. What can I do with my stairs?  Do not leave any items on the stairs.  Make sure that there  are handrails on both sides of the stairs and use them. Fix handrails that are broken or loose. Make sure that handrails are as long as the stairways.  Check any carpeting to make sure that it is firmly attached to the stairs. Fix any carpet that is loose or worn.  Avoid having throw rugs at the top or bottom of the stairs. If you do have throw rugs, attach them to the floor with carpet tape.  Make sure that you have a light switch at the top of the stairs and the bottom of the stairs. If you do not have them, ask someone to add them for you. What else can I do to help prevent falls?  Wear shoes that:  Do not have high heels.  Have rubber bottoms.  Are comfortable and fit you well.  Are closed at the toe. Do not wear sandals.  If you use a stepladder:  Make sure that it is fully opened. Do not climb a closed stepladder.  Make sure that both sides of the stepladder are locked into place.  Ask someone to hold it for you, if possible.  Clearly mark and make sure that you can see:  Any grab bars or handrails.  First and last steps.  Where the edge of each step is.  Use tools that help you move around (mobility aids) if they are needed. These include:  Canes.  Walkers.  Scooters.  Crutches.  Turn on the lights when you go into a dark area. Replace any light bulbs as soon as they burn out.  Set up your furniture so you have a clear path. Avoid moving your furniture around.  If any of your floors are uneven, fix them.  If there are any pets around you, be aware of where they are.  Review your medicines with your doctor. Some  medicines can make you feel dizzy. This can increase your chance of falling. Ask your doctor what other things that you can do to help prevent falls. This information is not intended to replace advice given to you by your health care provider. Make sure you discuss any questions you have with your health care provider. Document Released: 02/01/2009 Document Revised: 09/13/2015 Document Reviewed: 05/12/2014 Elsevier Interactive Patient Education  2017 [employer]  Health Maintenance, Female Adopting a healthy lifestyle and getting preventive care can go a long way to promote health and wellness. Talk with your health care provider about what schedule of regular examinations is right for you. This is a good chance for you to check in with your provider about disease prevention and staying healthy. In between checkups, there are plenty of things you can do on your own. Experts have done a lot of research about which lifestyle changes and preventive measures are most likely to keep you healthy. Ask your health care provider for more information. Weight and diet Eat a healthy diet  Be sure to include plenty of vegetables, fruits, low-fat dairy products, and lean protein.  Do not eat a lot of foods high in solid fats, added sugars, or salt.  Get regular exercise. This is one of the most important things you can do for your health.  Most adults should exercise for at least 150 minutes each week. The exercise should increase your heart rate and make you sweat (moderate-intensity exercise).  Most adults should also do strengthening exercises at least twice a week. This is in addition to the moderate-intensity exercise. Maintain a healthy weight  Body   mass index (BMI) is a measurement that can be used to identify possible weight problems. It estimates body fat based on height and weight. Your health care provider can help determine your BMI and help you achieve or maintain a healthy weight.  For  females 20 years of age and older:  A BMI below 18.5 is considered underweight.  A BMI of 18.5 to 24.9 is normal.  A BMI of 25 to 29.9 is considered overweight.  A BMI of 30 and above is considered obese. Watch levels of cholesterol and blood lipids  You should start having your blood tested for lipids and cholesterol at 68 years of age, then have this test every 5 years.  You may need to have your cholesterol levels checked more often if:  Your lipid or cholesterol levels are high.  You are older than 68 years of age.  You are at high risk for heart disease. Cancer screening Lung Cancer  Lung cancer screening is recommended for adults 55-80 years old who are at high risk for lung cancer because of a history of smoking.  A yearly low-dose CT scan of the lungs is recommended for people who:  Currently smoke.  Have quit within the past 15 years.  Have at least a 30-pack-year history of smoking. A pack year is smoking an average of one pack of cigarettes a day for 1 year.  Yearly screening should continue until it has been 15 years since you quit.  Yearly screening should stop if you develop a health problem that would prevent you from having lung cancer treatment. Breast Cancer  Practice breast self-awareness. This means understanding how your breasts normally appear and feel.  It also means doing regular breast self-exams. Let your health care provider know about any changes, no matter how small.  If you are in your 20s or 30s, you should have a clinical breast exam (CBE) by a health care provider every 1-3 years as part of a regular health exam.  If you are 40 or older, have a CBE every year. Also consider having a breast X-ray (mammogram) every year.  If you have a family history of breast cancer, talk to your health care provider about genetic screening.  If you are at high risk for breast cancer, talk to your health care provider about having an MRI and a mammogram  every year.  Breast cancer gene (BRCA) assessment is recommended for women who have family members with BRCA-related cancers. BRCA-related cancers include:  Breast.  Ovarian.  Tubal.  Peritoneal cancers.  Results of the assessment will determine the need for genetic counseling and BRCA1 and BRCA2 testing. Cervical Cancer  Your health care provider may recommend that you be screened regularly for cancer of the pelvic organs (ovaries, uterus, and vagina). This screening involves a pelvic examination, including checking for microscopic changes to the surface of your cervix (Pap test). You may be encouraged to have this screening done every 3 years, beginning at age 21.  For women ages 30-65, health care providers may recommend pelvic exams and Pap testing every 3 years, or they may recommend the Pap and pelvic exam, combined with testing for human papilloma virus (HPV), every 5 years. Some types of HPV increase your risk of cervical cancer. Testing for HPV may also be done on women of any age with unclear Pap test results.  Other health care providers may not recommend any screening for nonpregnant women who are considered low risk for pelvic cancer and   who do not have symptoms. Ask your health care provider if a screening pelvic exam is right for you.  If you have had past treatment for cervical cancer or a condition that could lead to cancer, you need Pap tests and screening for cancer for at least 20 years after your treatment. If Pap tests have been discontinued, your risk factors (such as having a new sexual partner) need to be reassessed to determine if screening should resume. Some women have medical problems that increase the chance of getting cervical cancer. In these cases, your health care provider may recommend more frequent screening and Pap tests. Colorectal Cancer  This type of cancer can be detected and often prevented.  Routine colorectal cancer screening usually begins at 68  years of age and continues through 68 years of age.  Your health care provider may recommend screening at an earlier age if you have risk factors for colon cancer.  Your health care provider may also recommend using home test kits to check for hidden blood in the stool.  A small camera at the end of a tube can be used to examine your colon directly (sigmoidoscopy or colonoscopy). This is done to check for the earliest forms of colorectal cancer.  Routine screening usually begins at age 50.  Direct examination of the colon should be repeated every 5-10 years through 68 years of age. However, you may need to be screened more often if early forms of precancerous polyps or small growths are found. Skin Cancer  Check your skin from head to toe regularly.  Tell your health care provider about any new moles or changes in moles, especially if there is a change in a mole's shape or color.  Also tell your health care provider if you have a mole that is larger than the size of a pencil eraser.  Always use sunscreen. Apply sunscreen liberally and repeatedly throughout the day.  Protect yourself by wearing long sleeves, pants, a wide-brimmed hat, and sunglasses whenever you are outside. Heart disease, diabetes, and high blood pressure  High blood pressure causes heart disease and increases the risk of stroke. High blood pressure is more likely to develop in:  People who have blood pressure in the high end of the normal range (130-139/85-89 mm Hg).  People who are overweight or obese.  People who are African American.  If you are 18-39 years of age, have your blood pressure checked every 3-5 years. If you are 40 years of age or older, have your blood pressure checked every year. You should have your blood pressure measured twice-once when you are at a hospital or clinic, and once when you are not at a hospital or clinic. Record the average of the two measurements. To check your blood pressure when  you are not at a hospital or clinic, you can use:  An automated blood pressure machine at a pharmacy.  A home blood pressure monitor.  If you are between 55 years and 79 years old, ask your health care provider if you should take aspirin to prevent strokes.  Have regular diabetes screenings. This involves taking a blood sample to check your fasting blood sugar level.  If you are at a normal weight and have a low risk for diabetes, have this test once every three years after 68 years of age.  If you are overweight and have a high risk for diabetes, consider being tested at a younger age or more often. Preventing infection Hepatitis B    If you have a higher risk for hepatitis B, you should be screened for this virus. You are considered at high risk for hepatitis B if:  You were born in a country where hepatitis B is common. Ask your health care provider which countries are considered high risk.  Your parents were born in a high-risk country, and you have not been immunized against hepatitis B (hepatitis B vaccine).  You have HIV or AIDS.  You use needles to inject street drugs.  You live with someone who has hepatitis B.  You have had sex with someone who has hepatitis B.  You get hemodialysis treatment.  You take certain medicines for conditions, including cancer, organ transplantation, and autoimmune conditions. Hepatitis C  Blood testing is recommended for:  Everyone born from [birth year] through 1967.  Anyone with known risk factors for hepatitis C. Sexually transmitted infections (STIs)  You should be screened for sexually transmitted infections (STIs) including gonorrhea and chlamydia if:  You are sexually active and are younger than 68 years of age.  You are older than 68 years of age and your health care provider tells you that you are at risk for this type of infection.  Your sexual activity has changed since you were last screened and you are at an increased risk for  chlamydia or gonorrhea. Ask your health care provider if you are at risk.  If you do not have HIV, but are at risk, it may be recommended that you take a prescription medicine daily to prevent HIV infection. This is called pre-exposure prophylaxis (PrEP). You are considered at risk if:  You are sexually active and do not regularly use condoms or know the HIV status of your partner(s).  You take drugs by injection.  You are sexually active with a partner who has HIV. Talk with your health care provider about whether you are at high risk of being infected with HIV. If you choose to begin PrEP, you should first be tested for HIV. You should then be tested every 3 months for as long as you are taking PrEP. Pregnancy  If you are premenopausal and you may become pregnant, ask your health care provider about preconception counseling.  If you may become pregnant, take 400 to 800 micrograms (mcg) of folic acid every day.  If you want to prevent pregnancy, talk to your health care provider about birth control (contraception). Osteoporosis and menopause  Osteoporosis is a disease in which the bones lose minerals and strength with aging. This can result in serious bone fractures. Your risk for osteoporosis can be identified using a bone density scan.  If you are 68 years of age or older, or if you are at risk for osteoporosis and fractures, ask your health care provider if you should be screened.  Ask your health care provider whether you should take a calcium or vitamin D supplement to lower your risk for osteoporosis.  Menopause may have certain physical symptoms and risks.  Hormone replacement therapy may reduce some of these symptoms and risks. Talk to your health care provider about whether hormone replacement therapy is right for you. Follow these instructions at home:  Schedule regular health, dental, and eye exams.  Stay current with your immunizations.  Do not use any tobacco products  including cigarettes, chewing tobacco, or electronic cigarettes.  If you are pregnant, do not drink alcohol.  If you are breastfeeding, limit how much and how often you drink alcohol.  Limit alcohol intake to no  more than 1 drink per day for nonpregnant women. One drink equals 12 ounces of beer, 5 ounces of wine, or 1 ounces of hard liquor.  Do not use street drugs.  Do not share needles.  Ask your health care provider for help if you need support or information about quitting drugs.  Tell your health care provider if you often feel depressed.  Tell your health care provider if you have ever been abused or do not feel safe at home. This information is not intended to replace advice given to you by your health care provider. Make sure you discuss any questions you have with your health care provider. Document Released: 10/21/2010 Document Revised: 09/13/2015 Document Reviewed: 01/09/2015 Elsevier Interactive Patient Education  2017 [employer]   Hearing Loss Hearing loss is a partial or total loss of the ability to hear. This can be temporary or permanent, and it can happen in one or both ears. Hearing loss may be referred to as deafness. Medical care is necessary to treat hearing loss properly and to prevent the condition from getting worse. Your hearing may partially or completely come back, depending on what caused your hearing loss and how severe it is. In some cases, hearing loss is permanent. What are the causes? Common causes of hearing loss include:  Too much wax in the ear canal.  Infection of the ear canal or middle ear.  Fluid in the middle ear.  Injury to the ear or surrounding area.  An object stuck in the ear.  Prolonged exposure to loud sounds, such as music. Less common causes of hearing loss include:  Tumors in the ear.  Viral or bacterial infections, such as meningitis.  A hole in the eardrum (perforated eardrum).  Problems with the hearing nerve  that sends signals between the brain and the ear.  Certain medicines. What are the signs or symptoms? Symptoms of this condition may include:  Difficulty telling the difference between sounds.  Difficulty following a conversation when there is background noise.  Lack of response to sounds in your environment. This may be most noticeable when you do not respond to startling sounds.  Needing to turn up the volume on the television, radio, etc.  Ringing in the ears.  Dizziness.  Pain in the ears. How is this diagnosed? This condition is diagnosed based on a physical exam and a hearing test (audiometry). The audiometry test will be performed by a hearing specialist (audiologist). You may also be referred to an ear, nose, and throat (ENT) specialist (otolaryngologist). How is this treated? Treatment for recent onset of hearing loss may include:  Ear wax removal.  Being prescribed medicines to prevent infection (antibiotics).  Being prescribed medicines to reduce inflammation (corticosteroids). Follow these instructions at home:  If you were prescribed an antibiotic medicine, take it as told by your health care provider. Do not stop taking the antibiotic even if you start to feel better.  Take over-the-counter and prescription medicines only as told by your health care provider.  Avoid loud noises.  Return to your normal activities as told by your health care provider. Ask your health care provider what activities are safe for you.  Keep all follow-up visits as told by your health care provider. This is important. Contact a health care provider if:  You feel dizzy.  You develop new symptoms.  You vomit or feel nauseous.  You have a fever. Get help right away if:  You develop sudden changes in your vision.    You have severe ear pain.  You have new or increased weakness.  You have a severe headache. This information is not intended to replace advice given to you by  your health care provider. Make sure you discuss any questions you have with your health care provider. Document Released: 04/07/2005 Document Revised: 09/13/2015 Document Reviewed: 08/23/2014 Elsevier Interactive Patient Education  2017 [employer].   Steps to Quit Smoking Smoking tobacco can be bad for your health. It can also affect almost every organ in your body. Smoking puts you and people around you at risk for many serious long-lasting (chronic) diseases. Quitting smoking is hard, but it is one of the best things that you can do for your health. It is never too late to quit. What are the benefits of quitting smoking? When you quit smoking, you lower your risk for getting serious diseases and conditions. They can include:  Lung cancer or lung disease.  Heart disease.  Stroke.  Heart attack.  Not being able to have children (infertility).  Weak bones (osteoporosis) and broken bones (fractures). If you have coughing, wheezing, and shortness of breath, those symptoms may get better when you quit. You may also get sick less often. If you are pregnant, quitting smoking can help to lower your chances of having a baby of low birth weight. What can I do to help me quit smoking? Talk with your doctor about what can help you quit smoking. Some things you can do (strategies) include:  Quitting smoking totally, instead of slowly cutting back how much you smoke over a period of time.  Going to in-person counseling. You are more likely to quit if you go to many counseling sessions.  Using resources and support systems, such as:  Online chats with a Veterinary surgeon.  Phone quitlines.  Printed Materials engineer.  Support groups or group counseling.  Text messaging programs.  Mobile phone apps or applications.  Taking medicines. Some of these medicines may have nicotine in them. If you are pregnant or breastfeeding, do not take any medicines to quit smoking unless your doctor says it  is okay. Talk with your doctor about counseling or other things that can help you. Talk with your doctor about using more than one strategy at the same time, such as taking medicines while you are also going to in-person counseling. This can help make quitting easier. What things can I do to make it easier to quit? Quitting smoking might feel very hard at first, but there is a lot that you can do to make it easier. Take these steps:  Talk to your family and friends. Ask them to support and encourage you.  Call phone quitlines, reach out to support groups, or work with a Veterinary surgeon.  Ask people who smoke to not smoke around you.  Avoid places that make you want (trigger) to smoke, such as:  Bars.  Parties.  Smoke-break areas at work.  Spend time with people who do not smoke.  Lower the stress in your life. Stress can make you want to smoke. Try these things to help your stress:  Getting regular exercise.  Deep-breathing exercises.  Yoga.  Meditating.  Doing a body scan. To do this, close your eyes, focus on one area of your body at a time from head to toe, and notice which parts of your body are tense. Try to relax the muscles in those areas.  Download or buy apps on your mobile phone or tablet that can help you stick  to your quit plan. There are many free apps, such as QuitGuide from the [employer] Office manager for Disease Control and Prevention). You can find more support from smokefree.gov and other websites. This information is not intended to replace advice given to you by your health care provider. Make sure you discuss any questions you have with your health care provider. Document Released: 02/01/2009 Document Revised: 12/04/2015 Document Reviewed: 08/22/2014 Elsevier Interactive Patient Education  2017 [employer].

## 2016-08-28 NOTE — Patient Instructions (Signed)
Nice to see you again today. Your A1c is 7.8. We will increase her Lantus to 27 units twice a day. Your blood pressure is elevated today. We will add HCTZ 25 mg daily to the lisinopril, Coreg, amlodipine that you are taking.  Your cough seems related to a viral bronchitis. You can use Mucinex for the congestion. Continue Zyrtec, Flonase. You can use nasal saline at other times when your nose feels congested. A humidifier at night may help you breathe easier.  Follow-up with pharmacy clinic, Dr. Valentina Lucks, in one month.  Follow-up with PCP in 2 months.  Take care, Dr. B   Acute Bronchitis, Adult Acute bronchitis is when air tubes (bronchi) in the lungs suddenly get swollen. The condition can make it hard to breathe. It can also cause these symptoms:  A cough.  Coughing up clear, yellow, or green mucus.  Wheezing.  Chest congestion.  Shortness of breath.  A fever.  Body aches.  Chills.  A sore throat. Follow these instructions at home: Medicines   Take over-the-counter and prescription medicines only as told by your doctor. General instructions   Rest.  Drink enough fluids to keep your pee (urine) clear or pale yellow.  Avoid smoking and secondhand smoke. If you smoke and you need help quitting, ask your doctor. Quitting will help your lungs heal faster.  Use an inhaler, cool mist vaporizer, or humidifier as told by your doctor.  Keep all follow-up visits as told by your doctor. This is important. How is this prevented? To lower your risk of getting this condition again:  Wash your hands often with soap and water. If you cannot use soap and water, use hand sanitizer.  Avoid contact with people who have cold symptoms.  Try not to touch your hands to your mouth, nose, or eyes.  Make sure to get the flu shot every year. Contact a doctor if:  Your symptoms do not get better Get help right away if:  You cough up blood.  You have chest pain.  You have very bad  shortness of breath.  You become dehydrated.  You faint (pass out) or keep feeling like you are going to pass out.  You keep throwing up (vomiting).  You have a very bad headache.  Your fever or chills gets worse. This information is not intended to replace advice given to you by your health care provider. Make sure you discuss any questions you have with your health care provider. Document Released: 09/24/2007 Document Revised: 11/14/2015 Document Reviewed: 09/26/2015 Elsevier Interactive Patient Education  2017 [employer].

## 2016-08-28 NOTE — Telephone Encounter (Signed)
E-mycin on historical med list. Patient requesting this to help with gastric emptying. Hubbard Hartshorn, RN, BSN
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
## 2016-08-28 NOTE — Progress Notes (Cosign Needed)
Total time:5 min Type of Service: [place] Name and Language:NA  SUBJECTIVE: [name] [name] is a 66 y.o. female  Patient was referred by Dr. Brita Romp for:  depression,  accompanied by her adult daughter.  Patient was guarded and ready to leave when LCSW entered the room.  LCSW provided information on services offered by [facility] as well as other mental health options for community referrals.    INTERVENTIONS Reflective listening and information  Standardized Assessments completed: PHQ 9 is 21   ASSESSMENT:  Patient is currently experiencing depression.  Pt may benefit from and is in agreement to receive further assessment and brief therapeutic interventions to assist with managing her symptoms.   PLAN: 1. Patient will F/U with LCSW in one week 2. LCSW will F/U to call patient in 3 to 5 days [phone]  Or [phone] 3. Behavioral recommendations: follow-up with Integrated care 4. Referral: Brief Counseling/Psychotherapy   Warm Hand Off Completed.     Casimer Lanius, LCSW Licensed Clinical Social Worker [facility]   [phone] 3:34 PM
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
## 2016-08-29 ENCOUNTER — Encounter: Payer: Self-pay | Admitting: *Deleted

## 2016-08-29 MED ORDER — PENTOSAN POLYSULFATE SODIUM 100 MG PO CAPS: 100.0000 mg | ORAL_CAPSULE | Freq: Three times a day (TID) | ORAL | 1 refills | Status: DC

## 2016-08-29 MED ORDER — HYDROXYZINE HCL 25 MG PO TABS: ORAL_TABLET | ORAL | 5 refills | Status: DC

## 2016-08-29 MED ORDER — ROSUVASTATIN CALCIUM 20 MG PO TABS: ORAL_TABLET | ORAL | 3 refills | Status: DC

## 2016-08-29 MED ORDER — NYSTATIN 100000 UNIT/GM EX POWD: Freq: Three times a day (TID) | CUTANEOUS | 3 refills | Status: DC

## 2016-08-29 MED ORDER — CETIRIZINE HCL 10 MG PO TABS: 10.0000 mg | ORAL_TABLET | Freq: Every day | ORAL | 5 refills | Status: DC

## 2016-08-29 MED ORDER — CLOBETASOL PROPIONATE 0.05 % EX OINT: 1.0000 "application " | TOPICAL_OINTMENT | Freq: Two times a day (BID) | CUTANEOUS | 3 refills | Status: DC

## 2016-08-29 NOTE — Telephone Encounter (Signed)
Eye drops are prescribed by Ophtho.  No longer taking erythromycin.  [name] Crews, MD, MPH PGY-3,  [facility] 08/29/2016 9:51 AM

## 2016-08-29 NOTE — Assessment & Plan Note (Signed)
Uncontrolled today Continue lisinopril, ASA, coreg, amlodipine Resume HCTZ 25mg  daily f/u in 1 month with pharmacy clinic

## 2016-08-29 NOTE — Assessment & Plan Note (Signed)
No fever and not ill appearing and lung exam is non-focal so doubt PNA Exam and time course c/w viral bronchitis Discussed symptomatic management, natural course, and return precautions If patient continues to have chronic cough, could consider switching from ACEi to ARB (though cough today is not that of ACEi chronic cough), undertreated GERD or allergic rhinitis Recent PFTs showed normal lung function without asthma or COPD Advised on smoking cessation

## 2016-08-29 NOTE — Progress Notes (Signed)
I have reviewed this visit and discussed with Howell Rucks, RN, BSN, and agree with her documentation.   Virginia Crews, MD, MPH PGY-3,  [facility] 08/29/2016 9:58 AM

## 2016-08-29 NOTE — Assessment & Plan Note (Signed)
Uncontrolled, A1c 7.8 (goal <7.5 given numerous comorbidities) Increase Lantus to 27 units BID Bring log of fasting BGs to next visit

## 2016-09-03 ENCOUNTER — Telehealth: Payer: Self-pay | Admitting: Licensed Clinical Social Worker

## 2016-09-03 NOTE — Progress Notes (Signed)
[facility] f/u phone call to patient reference resources provided during joint visit with PCP. Patient was unsure of following up with integrated care when leaving the office.  LCSW called both phone numbers provided by patient [phone] and [phone]. Left messages to call LCSW.  As of today no future appointments have been scheduled.  Plan: LCSW will wait for return call if patient decides on further interventions or community resources.  Casimer Lanius, LCSW Licensed Clinical Social Worker [place]   [phone] 11:00 AM

## 2016-10-13 ENCOUNTER — Other Ambulatory Visit: Payer: Self-pay | Admitting: Family Medicine

## 2016-10-13 ENCOUNTER — Encounter: Payer: Self-pay | Admitting: Pharmacist

## 2016-10-13 ENCOUNTER — Ambulatory Visit (INDEPENDENT_AMBULATORY_CARE_PROVIDER_SITE_OTHER): Payer: Medicare Other | Admitting: Pharmacist

## 2016-10-13 DIAGNOSIS — K219 Gastro-esophageal reflux disease without esophagitis: Secondary | ICD-10-CM

## 2016-10-13 DIAGNOSIS — E1169 Type 2 diabetes mellitus with other specified complication: Secondary | ICD-10-CM

## 2016-10-13 DIAGNOSIS — E1100 Type 2 diabetes mellitus with hyperosmolarity without nonketotic hyperglycemic-hyperosmolar coma (NKHHC): Secondary | ICD-10-CM

## 2016-10-13 DIAGNOSIS — E1143 Type 2 diabetes mellitus with diabetic autonomic (poly)neuropathy: Secondary | ICD-10-CM

## 2016-10-13 DIAGNOSIS — B372 Candidiasis of skin and nail: Secondary | ICD-10-CM

## 2016-10-13 DIAGNOSIS — I10 Essential (primary) hypertension: Secondary | ICD-10-CM

## 2016-10-13 DIAGNOSIS — E785 Hyperlipidemia, unspecified: Secondary | ICD-10-CM | POA: Diagnosis not present

## 2016-10-13 DIAGNOSIS — Z720 Tobacco use: Secondary | ICD-10-CM

## 2016-10-13 DIAGNOSIS — J302 Other seasonal allergic rhinitis: Secondary | ICD-10-CM | POA: Diagnosis not present

## 2016-10-13 DIAGNOSIS — K3184 Gastroparesis: Secondary | ICD-10-CM | POA: Diagnosis not present

## 2016-10-13 DIAGNOSIS — L299 Pruritus, unspecified: Secondary | ICD-10-CM | POA: Diagnosis not present

## 2016-10-13 MED ORDER — PREGABALIN 150 MG PO CAPS: 150.0000 mg | ORAL_CAPSULE | Freq: Three times a day (TID) | ORAL | 5 refills | Status: DC

## 2016-10-13 MED ORDER — NYSTATIN 100000 UNIT/GM EX POWD: Freq: Three times a day (TID) | CUTANEOUS | 3 refills | Status: DC

## 2016-10-13 MED ORDER — LOSARTAN POTASSIUM 100 MG PO TABS: 100.0000 mg | ORAL_TABLET | Freq: Every day | ORAL | 2 refills | Status: DC

## 2016-10-13 MED ORDER — MIRTAZAPINE 7.5 MG PO TABS: 7.5000 mg | ORAL_TABLET | Freq: Every day | ORAL | 1 refills | Status: DC

## 2016-10-13 MED ORDER — CETIRIZINE HCL 10 MG PO TABS: 10.0000 mg | ORAL_TABLET | Freq: Every day | ORAL | 5 refills | Status: DC

## 2016-10-13 MED ORDER — HYDROXYZINE HCL 25 MG PO TABS: ORAL_TABLET | ORAL | 5 refills | Status: DC

## 2016-10-13 MED ORDER — NYSTATIN 100000 UNIT/GM EX CREA: 1.0000 "application " | TOPICAL_CREAM | Freq: Two times a day (BID) | CUTANEOUS | 3 refills | Status: DC

## 2016-10-13 MED ORDER — RANITIDINE HCL 150 MG PO TABS: 150.0000 mg | ORAL_TABLET | Freq: Every day | ORAL | 5 refills | Status: DC | PRN

## 2016-10-13 MED ORDER — ROSUVASTATIN CALCIUM 20 MG PO TABS: ORAL_TABLET | ORAL | 3 refills | Status: DC

## 2016-10-13 MED ORDER — FLUTICASONE PROPIONATE 50 MCG/ACT NA SUSP: 2.0000 | Freq: Every day | NASAL | 6 refills | Status: DC

## 2016-10-13 MED ORDER — NICOTINE 14 MG/24HR TD PT24: 14.0000 mg | MEDICATED_PATCH | Freq: Every day | TRANSDERMAL | 1 refills | Status: DC

## 2016-10-13 NOTE — Assessment & Plan Note (Signed)
Tobacco use. Per previous note: 0.2 PPDx 30 years, 6 pack years. Currently smokes 1-2 cigarettes a day, but has cravings and feels that she "needs help to really quit." Was on nicotine patch when she was in the hospital. -Sent nicotine patch 14 mg/day patch to [facility], but may not be covered under her insurance -Counseled patient to call 1800-QUITLINE to obtain free nicotine patches

## 2016-10-13 NOTE — Assessment & Plan Note (Signed)
Diabetes longstanding currently controlled with last A1C 7.8% (5/10). Currently taking insulin glargine (Lantus) 27 units BID. -no medication changes for her diabetes medication today -For ASCVD risk reduction, restart rosuvastatin 20mg  once daily -For diabetic gastroparesis, keep taking metoclopramide and start mirtazapine 7.5 mg once daily

## 2016-10-13 NOTE — Progress Notes (Signed)
   S:    Patient arrives ambulating without assistance. Presents to the clinic for hypertension evaluation.  Patient was last seen by Primary Care Provider on 08/29/16.   Patient reports adherence with most medications, though she was told to stop taking rosuvastatin in the past, and she has recently ran out of refills on several of her medications, including certrizine, fluticasone nasal spray, hydroxyzine, ranitidine, pregabalin, and pentosan polysulfate sodium (Elmiron) Patient reports nocturia (4x a night) with HCTZ Patient reports recent congestion, mucus production, and occassional chest pain that worsen at night and make it difficult for her to sleep. Reports using cough syrups, but has not been effective.  Current BP Medications include:  Amlodipine 10mg  once daily, carvedilol 12.5mg  twice daily, HCTZ 25mg  once daily, lisinopril 40mg  once daily  Antihypertensives tried in the past include: spironolactone   O:  Last 3 Office BP readings: BP Readings from Last 3 Encounters:  10/13/16 (!) 148/86  08/28/16 (!) 194/84  08/28/16 (!) 194/84   BMET    Component Value Date/Time   NA 142 07/08/2016 1040   K 4.0 07/08/2016 1040   CL 101 07/08/2016 1040   CO2 24 07/08/2016 1040   GLUCOSE 177 (H) 07/08/2016 1040   GLUCOSE 157 (H) 03/19/2016 1444   BUN 10 07/08/2016 1040   CREATININE 0.84 07/08/2016 1040   CREATININE 0.75 03/19/2016 1444   CALCIUM 9.1 07/08/2016 1040   GFRNONAA 72 07/08/2016 1040   GFRNONAA 83 03/19/2016 1444   GFRAA 83 07/08/2016 1040   GFRAA >89 03/19/2016 1444    A/P: Hypertension longstanding currently uncontrolled with BP 148/86 today on current medications. Due to complaints of nocturia and chronic cough I made several changes today.  -Stop HCTZ -Keep taking lisinopril until gone, then start losartan 10mg  once daily -Consider starting spironolactone at next visit  Diabetes longstanding currently controlled with last A1C 7.8% (5/10). Currently taking insulin  glargine (Lantus) 27 units BID. -no medication changes for her diabetes medication today -For ASCVD risk reduction, restart rosuvastatin 20mg  once daily -For diabetic gastroparesis, keep taking metoclopramide and start mirtazapine 7.5 mg once daily  Tobacco use. Per previous note: 0.2 PPDx 30 years, 6 pack years. Currently smokes 1-2 cigarettes a day, but has cravings and feels that she "needs help to really quit." Was on nicotine patch when she was in the hospital. -Sent nicotine patch 14 mg/day patch to [facility], but may not be covered under her insurance -Counseled patient to call 1800-QUITLINE to obtain free nicotine patches  OTC: Takes Excedrin Migraine "4 tabs on a good day and 1 tab on a bad day" for headaches. Takes Advil PM occasionally for sleep.  -Refilled all medications mentioned above. We did not refill her Elmeron today and referred this refill to her neurologist.  Results reviewed and written information provided.   Total time in face-to-face counseling 35 minutes.   F/U Clinic Visit with Dr. Valentina Lucks on July 19th.  Patient seen with Shelle Iron, PharmD Candidate.

## 2016-10-13 NOTE — Assessment & Plan Note (Signed)
Hypertension longstanding currently uncontrolled with BP 148/86 today on current medications. Due to complaints of nocturia and chronic cough I made several changes today.  -Stop HCTZ -Keep taking lisinopril until gone, then start losartan 10mg  once daily -Consider starting spironolactone at next visit

## 2016-10-13 NOTE — Progress Notes (Signed)
Patient ID: [name] [name], female   DOB: 08-01-1950, 66 y.o.   MRN: [id number] Reviewed: Agree with Dr. Graylin Shiver documentation and management.

## 2016-10-13 NOTE — Patient Instructions (Addendum)
Thank you so much for coming in to see [place] today!  Here are the medication changes we discussed today:  1. Stop taking your hydrochlorthalidone. 2. Keep taking all of your lisinopril until gone. After your lisinopril runs out, start taking losartan 100mg  once daily for your blood pressure. 3. Keep taking your metoclopramide (Reglan) and start taking your mirtazapine 7.5 mg once daily  4. We have sent nicotine patch (Nicoderm) 14mg  a day to your pharmacy. Please call 1800-QUIT LINE and see if they can supply you with some free nicotine patches. Be sure to avoid applying the patch at night because it can make it difficult to sleep.  5. Restart your rosuvastatin (Crestor) for your cholesterol 6. We refilled your cetirizine (Zyrtec), fluticasone (Flonase) nasal spray, hydroxyzine, Nystatin cream and powder, ranitidine (Zantac), and pregabalin (Lyrica)  Please come back to see [place] on July 19th.

## 2016-10-24 ENCOUNTER — Ambulatory Visit: Payer: Medicare Other | Admitting: Internal Medicine

## 2016-11-06 ENCOUNTER — Encounter: Payer: Self-pay | Admitting: Pharmacist

## 2016-11-06 ENCOUNTER — Ambulatory Visit: Payer: Medicare Other | Admitting: Pharmacist

## 2016-11-06 ENCOUNTER — Ambulatory Visit (INDEPENDENT_AMBULATORY_CARE_PROVIDER_SITE_OTHER): Payer: Medicare Other | Admitting: Pharmacist

## 2016-11-06 DIAGNOSIS — E1142 Type 2 diabetes mellitus with diabetic polyneuropathy: Secondary | ICD-10-CM

## 2016-11-06 DIAGNOSIS — Z720 Tobacco use: Secondary | ICD-10-CM

## 2016-11-06 DIAGNOSIS — I10 Essential (primary) hypertension: Secondary | ICD-10-CM

## 2016-11-06 MED ORDER — NICOTINE 14 MG/24HR TD PT24: 14.0000 mg | MEDICATED_PATCH | Freq: Every day | TRANSDERMAL | 1 refills | Status: DC

## 2016-11-06 MED ORDER — INSULIN GLARGINE 100 UNIT/ML SOLOSTAR PEN: 30.0000 [IU] | PEN_INJECTOR | Freq: Two times a day (BID) | SUBCUTANEOUS | 1 refills | Status: DC

## 2016-11-06 MED ORDER — PREGABALIN 150 MG PO CAPS: 150.0000 mg | ORAL_CAPSULE | Freq: Three times a day (TID) | ORAL | 5 refills | Status: DC

## 2016-11-06 MED ORDER — SPIRONOLACTONE 25 MG PO TABS: 25.0000 mg | ORAL_TABLET | Freq: Every day | ORAL | 1 refills | Status: DC

## 2016-11-06 NOTE — Assessment & Plan Note (Signed)
Neuropathy: Currently uncontrolled.  Wrong lyrica quantity was sent in last time for only 10 day supply -Restarted Lyrica 150 mg TID.  Written Rx provided by Dr Andria Frames.

## 2016-11-06 NOTE — Patient Instructions (Addendum)
Increase Lantus to 30 units twice daily  Start Spironolactone 25 mg once daily  Start Nicotine patches 14 mg once daily   Restart Lyrica 150 mg three times daily   Followup with Dr Emmaline Life on 7/30

## 2016-11-06 NOTE — Progress Notes (Signed)
    S:     Chief Complaint  Patient presents with  . Medication Management    Diabetes   Patient arrives in good spirits ambulating without assistance.  Presents for diabetes evaluation, education, and management at the request of Dr Brita Romp. Patient was last seen in pharmacy clinic on 10/13/16.   Patient reports using Flonase 1 spray TID and Zyrtec 10 mg BID.  She reports saline nasal spray doesn't help her very much when it is hot outside.  Patient states mucus/cough seems to have improved since switching from Lisinopril to Losartan.  She is still coughing but states her mucus isn't as bad.  She needs Lantus refill for 1 month and Lyrica 30 day supply.    She reports needing zofran especially after she eats and feels like she is throwing up.  She uses twice daily and it is worse with milk or fruit.    Patient reports adherence with medications.  Current diabetes medications include: Lantus 27 units BID Current hypertension medications include: amlodipine 10 mg daily, carvedilol 12.5 mg BID, losartan 100 mg daily,   Patient denies hypoglycemic events.  Reports lowest CBG of 167 mg/dL.  Her highest CBG reported is low 200s.    Tobacco Abuse Smoking 4 cigarettes per day.  She has never picked up the nicotine patches.  States the pharmacy did not have them ready.  She believes she won't need to smoke if she receives her patches.  She tried the [employer] but was waiting on hold.    O:  Physical Exam  Constitutional: She appears well-developed and well-nourished.  Musculoskeletal: She exhibits edema.  Vitals reviewed.  Review of Systems  Constitutional: Negative.      Lab Results  Component Value Date   HGBA1C 7.8 08/28/2016   Lipid Panel     Component Value Date/Time   CHOL 139 01/14/2016 2308   TRIG 187 (H) 01/14/2016 2308   HDL 37 (L) 01/14/2016 2308   CHOLHDL 3.8 01/14/2016 2308   VLDL 37 01/14/2016 2308   LDLCALC 65 01/14/2016 2308   LDLDIRECT 62.5 12/01/2011 0832      Vitals:   11/06/16 1511  BP: (!) 181/67  Pulse: 91    A/P: Diabetes longstanding diagnosed currently slightly above goal. Patient denies hypoglycemic events and is able to verbalize appropriate hypoglycemia management plan. Patient reports adherence with medication. -Increased dose of basal insulin Lantus (insulin glargine) to 30 units BID  ASCVD risk greater than 7.5%. Continued Aspirin 81 mg and Continued rosuvastatin 20 mg daily.   Hypertension longstanding diagnosed currently uncontrolled.  Patient reports adherence with medication. -BMET obtained today.  -Restart spironolactone 25 mg daily -Consider repeat BMET at next visit I am happy to collaborate on additional BP lowering at next PCP visit with Dr. Emmaline Life.   Smoking Cessation: Currently smoking 4 cigarettes per day. Unable to start patches due to pharmacy issues -Restart nicotine patches 14 mg daily  Neuropathy: Currently uncontrolled.  Wrong lyrica quantity was sent in last time for only 10 day supply -Restarted Lyrica 150 mg TID.  Written Rx provided by Dr Andria Frames.   Written patient instructions provided.  Total time in face to face counseling 45 minutes.   Follow up with Dr Emmaline Life on 7/30.   Patient seen with Bennye Alm, PharmD, BCPS

## 2016-11-06 NOTE — Assessment & Plan Note (Signed)
Hypertension longstanding diagnosed currently uncontrolled.  Patient reports adherence with medication. -BMET obtained today.  -Restart spironolactone 25 mg daily -Consider repeat BMET at next visit

## 2016-11-06 NOTE — Progress Notes (Signed)
Patient ID: [name] [name], female   DOB: [birth year], 66 y.o.   MRN: [id number] Reviewed: Agree with Dr. Graylin Shiver documentation and management.

## 2016-11-06 NOTE — Assessment & Plan Note (Signed)
Diabetes longstanding diagnosed currently slightly above goal. Patient denies hypoglycemic events and is able to verbalize appropriate hypoglycemia management plan. Patient reports adherence with medication. -Increased dose of basal insulin Lantus (insulin glargine) to 30 units BID

## 2016-11-07 LAB — BASIC METABOLIC PANEL
BUN/Creatinine Ratio: 10 — ABNORMAL LOW (ref 12–28)
BUN: 8 mg/dL (ref 8–27)
CO2: 24 mmol/L (ref 20–29)
Calcium: 9.1 mg/dL (ref 8.7–10.3)
Chloride: 102 mmol/L (ref 96–106)
Creatinine, Ser: 0.81 mg/dL (ref 0.57–1.00)
GFR calc Af Amer: 86 mL/min/{1.73_m2} (ref >59)
GFR calc non Af Amer: 75 mL/min/{1.73_m2} (ref >59)
Glucose: 238 mg/dL — ABNORMAL HIGH (ref 65–99)
Potassium: 3.9 mmol/L (ref 3.5–5.2)
Sodium: 143 mmol/L (ref 134–144)

## 2016-11-17 ENCOUNTER — Ambulatory Visit: Payer: Medicare Other | Admitting: Internal Medicine

## 2016-11-20 ENCOUNTER — Encounter: Payer: Self-pay | Admitting: Internal Medicine

## 2016-11-20 ENCOUNTER — Ambulatory Visit (INDEPENDENT_AMBULATORY_CARE_PROVIDER_SITE_OTHER): Payer: Medicare Other | Admitting: Internal Medicine

## 2016-11-20 VITALS — BP 156/90 | HR 91 | Temp 98.7°F | Ht 63.0 in | Wt 204.0 lb

## 2016-11-20 DIAGNOSIS — I10 Essential (primary) hypertension: Secondary | ICD-10-CM | POA: Diagnosis not present

## 2016-11-20 DIAGNOSIS — M797 Fibromyalgia: Secondary | ICD-10-CM | POA: Diagnosis not present

## 2016-11-20 MED ORDER — BACLOFEN 10 MG PO TABS: 10.0000 mg | ORAL_TABLET | Freq: Three times a day (TID) | ORAL | 0 refills | Status: DC

## 2016-11-20 NOTE — Patient Instructions (Signed)
I want you to try baclofen for your pain. I want to increase your spirolactone to 2 pills. Please follow up with me in two weeks to see how things are doing .

## 2016-11-20 NOTE — Progress Notes (Signed)
   [facility] [name], MD Phone: [phone]  Reason For Visit: Pain and  HTN  # Pain /Fibromyalgia  - Several years of back, chest, and pain in bilateral legs - along the upper thighs - In the last two weeks the pain has been getting worse  - Denies any joint pain, indicates stabbing pain in the legs, does not radiate down the back  - Patient denies any change in pain with walking and sitting still - neither of which ease the pain  - Lyrica helps taking the edge off - gabapentin patient had strong side effects  - Has been oxycodone in the past and Ibuprofen for the pain - both of which have helped with pain - Pain radiates everywhere when she has pancreatitis -   - Denies any hx of diarrhea - Dad had a hx of hepatitis and pancreatitis  - No fevers, no weight loss  - Indicates chest pain associated with pancreatitis episodes, though not currently feeling like a pancreatitis episode - No nausea or vomiting   CHRONIC HTN: Reports blood pressures has always been elevated  Current Meds - Losartan, spirolactone, coreg, Norvasc  Reports good compliance, took meds today. Tolerating well, w/o complaints. Lifestyle - does not do much exercise  Denies dyspnea, HA, edema, dizziness / lightheadedness   Past Medical History Reviewed problem list.  Medications- reviewed and updated No additions to family history Social history- patient is a non- smoker  Objective: BP (!) 156/90   Pulse 91   Temp 98.7 F (37.1 C) (Oral)   Ht 5\' 3"  (1.6 m)   Wt 204 lb (92.5 kg)   BMI 36.14 kg/m  Gen: NAD, alert, cooperative with exam Cardio: regular rate and rhythm, S1S2 heard, no murmurs appreciated Pulm: clear to auscultation bilaterally, no wheezes, rhonchi or rales GI: soft, non-tender, non-distended, bowel sounds present, very slight tendereness throughout all abdominal quadrants, no hepatomegaly, no splenomegaly MSK: Tenderness all throughout paraspinal muscles of  body, tenderness along bilateral extremities  Skin: dry, intact, no rashes or lesions   Assessment/Plan: See problem based a/p  Fibromyalgia Continue Lyrica and add baclofen, did not find a general work up of patient muscular pain, will obtain as follows below to rule out polymyalgia rheumatic and other autoimmune disease contributions.  - baclofen (LIORESAL) 10 MG tablet; Take 1 tablet (10 mg total) by mouth 3 (three) times daily.  Dispense: 30 each; Refill: 0 - ANA - C-reactive protein - Sedimentation Rate - TSH   Essential hypertension Uncontrolled  Can not take HCTZ due to hx of gout. Patient therefore on Spirolactone, Coreg, Losartan, and Norvasc  - Will check BMET today due to recent restarting of spirolactone - increase the dose given patient currently at goal blood pressure

## 2016-11-21 LAB — BASIC METABOLIC PANEL
BUN/Creatinine Ratio: 10 — ABNORMAL LOW (ref 12–28)
BUN: 8 mg/dL (ref 8–27)
CO2: 22 mmol/L (ref 20–29)
Calcium: 9.5 mg/dL (ref 8.7–10.3)
Chloride: 104 mmol/L (ref 96–106)
Creatinine, Ser: 0.78 mg/dL (ref 0.57–1.00)
GFR calc Af Amer: 90 mL/min/{1.73_m2} (ref >59)
GFR calc non Af Amer: 78 mL/min/{1.73_m2} (ref >59)
Glucose: 231 mg/dL — ABNORMAL HIGH (ref 65–99)
Potassium: 3.4 mmol/L — ABNORMAL LOW (ref 3.5–5.2)
Sodium: 144 mmol/L (ref 134–144)

## 2016-11-21 LAB — SEDIMENTATION RATE: Sed Rate: 26 mm/hr (ref 0–40)

## 2016-11-21 LAB — C-REACTIVE PROTEIN: CRP: 3 mg/L (ref 0.0–4.9)

## 2016-11-21 LAB — TSH: TSH: 0.955 u[IU]/mL (ref 0.450–4.500)

## 2016-11-21 LAB — ANA: Anti Nuclear Antibody(ANA): NEGATIVE

## 2016-11-24 NOTE — Assessment & Plan Note (Signed)
Continue Lyrica and add baclofen, did not find a general work up of patient muscular pain, will obtain as follows below to rule out polymyalgia rheumatic and other autoimmune disease contributions.  - baclofen (LIORESAL) 10 MG tablet; Take 1 tablet (10 mg total) by mouth 3 (three) times daily.  Dispense: 30 each; Refill: 0 - ANA - C-reactive protein - Sedimentation Rate - TSH

## 2016-11-24 NOTE — Assessment & Plan Note (Addendum)
Uncontrolled  Can not take HCTZ due to hx of gout. Patient therefore on Spirolactone, Coreg, Losartan, and Norvasc  - Will check BMET today due to recent restarting of spirolactone - increase the dose given patient currently at goal blood pressure  - Follow up in two weeks

## 2016-11-25 ENCOUNTER — Encounter: Payer: Self-pay | Admitting: Internal Medicine

## 2016-12-25 ENCOUNTER — Other Ambulatory Visit: Payer: Self-pay | Admitting: Internal Medicine

## 2016-12-25 DIAGNOSIS — Z1231 Encounter for screening mammogram for malignant neoplasm of breast: Secondary | ICD-10-CM

## 2017-01-02 ENCOUNTER — Ambulatory Visit: Payer: Medicare Other

## 2017-01-08 ENCOUNTER — Encounter: Payer: Self-pay | Admitting: Internal Medicine

## 2017-01-08 ENCOUNTER — Ambulatory Visit (INDEPENDENT_AMBULATORY_CARE_PROVIDER_SITE_OTHER): Payer: Medicare Other | Admitting: Internal Medicine

## 2017-01-08 VITALS — BP 160/68 | HR 99 | Temp 98.3°F | Ht 63.0 in | Wt 207.4 lb

## 2017-01-08 DIAGNOSIS — L301 Dyshidrosis [pompholyx]: Secondary | ICD-10-CM

## 2017-01-08 DIAGNOSIS — M5442 Lumbago with sciatica, left side: Secondary | ICD-10-CM

## 2017-01-08 DIAGNOSIS — I10 Essential (primary) hypertension: Secondary | ICD-10-CM

## 2017-01-08 DIAGNOSIS — E1142 Type 2 diabetes mellitus with diabetic polyneuropathy: Secondary | ICD-10-CM | POA: Diagnosis not present

## 2017-01-08 DIAGNOSIS — G8929 Other chronic pain: Secondary | ICD-10-CM

## 2017-01-08 LAB — BASIC METABOLIC PANEL

## 2017-01-08 LAB — POCT GLYCOSYLATED HEMOGLOBIN (HGB A1C): Hemoglobin A1C: 8.6

## 2017-01-08 MED ORDER — CARVEDILOL 25 MG PO TABS: 25.0000 mg | ORAL_TABLET | Freq: Two times a day (BID) | ORAL | 3 refills | Status: DC

## 2017-01-08 MED ORDER — KETOROLAC TROMETHAMINE 15 MG/ML IJ SOLN: 15.0000 mg | Freq: Once | INTRAMUSCULAR | Status: DC

## 2017-01-08 MED ORDER — MELOXICAM 15 MG PO TABS: 15.0000 mg | ORAL_TABLET | Freq: Every day | ORAL | 0 refills | Status: DC

## 2017-01-08 MED ORDER — CLOBETASOL PROPIONATE 0.05 % EX OINT: 1.0000 "application " | TOPICAL_OINTMENT | Freq: Two times a day (BID) | CUTANEOUS | 3 refills | Status: DC

## 2017-01-08 MED ORDER — KETOROLAC TROMETHAMINE 30 MG/ML IJ SOLN
30.0000 mg | Freq: Once | INTRAMUSCULAR | Status: AC
  Administered 2017-01-08: 30 mg via INTRAMUSCULAR

## 2017-01-08 NOTE — Progress Notes (Signed)
   [name] [name], MD Phone: [phone]  Reason For Visit: Follow up   # CHRONIC HTN: Reports  Has been taking blood pressure medications  Current Meds - Spiralactone, Almodopine, Losartan, Coreg  Reports good compliance, took meds today. Tolerating well, w/o complaints. Lifestyle - walks, nutrient  Denies CP, dyspnea, HA, edema, dizziness / lightheadedness  #Chronic Right Hip Pain  - Pain radiates from buttocks down to knee  - worsened since last visit, previous baclofen, did not help  - Has had xrays in the past, - Several years back seen by orthpedics, did exercise, - Patient received steriod shots in the hip previously   # CHRONIC DM, Type 2: Reports no concerns, patient has significant allergy to metformin  CBGs: Has not been check her blood sugars recently  Meds: Lantus 30 units, Novolog 50 units BID if you eat  Reports good compliance. Tolerating well w/o side-effects Currently on ACEi / ARB  Any hypoglycemia episodes: Denies diaphoresis, fatigue, weakness, jittery Denies polyuria, visual changes Endorse numbness or tingling, uses pregablin  Seen opthamology- Dr. Katy Fitch    Past Medical History Reviewed problem list.  Medications- reviewed and updated No additions to family history Social history- patient is a non- smoker  Objective: BP (!) 160/68   Pulse 99   Temp 98.3 F (36.8 C) (Oral)   Ht 5\' 3"  (1.6 m)   Wt 207 lb 6.4 oz (94.1 kg)   SpO2 98%   BMI 36.74 kg/m  Gen: NAD, alert, cooperative with exam Cardio: regular rate and rhythm, S1S2 heard, no murmurs appreciated Pulm: clear to auscultation bilaterally, no wheezes, rhonchi or rales MSK: No abnormalities noted on inspection, normal range of motion, sacroiliac tenderness, 5 out of 5 strength bilaterally, normal gait and station   Assessment/Plan: See problem based a/p  DM type 2 with diabetic peripheral neuropathy (HCC) A1C 8.6 today, up from previous -continue  Lantus 30 units and NovoLog 50 units twice a day -Patient states that she will make some changes in her diet specifically she is going to stop her sodas, rather than make changes to her insulin -Patient to follow-up with Dr. Katy Fitch for ophthalmology -Return in 1-2 months  Essential hypertension Uncontrolled.  Recently increased spironolactone check bmet today -Continue spironolactone, Norvasc and losratan - will increase Coreg today -If no improvement will increase spironolactone at next visit  Right-sided low back pain with right-sided sciatica Exacerbation of chronic right-sided sciatica pain, no red flags, good strength and both bilateral extremities. Will provide pain control. Patient is not currently interested in seeing orthopedics,has had imaging in the past.  - meloxicam (MOBIC) 15 MG tablet; Take 1 tablet (15 mg total) by mouth daily.  Dispense: 6 tablet; Refill: 0 - ketorolac (TORADOL) 30 MG/ML injection 30 mg; Inject 1 mL (30 mg total) into the muscle once. - tylenol as needed - Follow up in 1 month

## 2017-01-08 NOTE — Patient Instructions (Addendum)
I am going to give your a medication to help with your hip pain. I believe this is likely sciatic pain. I will prescribe U Mobic to take over the course of the next 6 days. I will give you a shot of Toradol today. I also want to follow up with orthopedics. I am going to increase your Coreg, please take 2 pills of your current medication instead of one. In the morning and at night. I will put in a prescription for 25 mg pill.  Please stop the Pepsi[employer] as discussed today to help with your blood sugar

## 2017-01-09 ENCOUNTER — Telehealth: Payer: Self-pay | Admitting: Internal Medicine

## 2017-01-09 ENCOUNTER — Other Ambulatory Visit: Payer: Self-pay | Admitting: Family Medicine

## 2017-01-09 DIAGNOSIS — E1143 Type 2 diabetes mellitus with diabetic autonomic (poly)neuropathy: Secondary | ICD-10-CM

## 2017-01-09 DIAGNOSIS — K3184 Gastroparesis: Principal | ICD-10-CM

## 2017-01-09 DIAGNOSIS — I10 Essential (primary) hypertension: Secondary | ICD-10-CM

## 2017-01-09 NOTE — Telephone Encounter (Signed)
Please call [name] [name] and let her know that her blood sample was bad (hemolysised), she needs to return for lab draw. Thanks [name]

## 2017-01-12 NOTE — Assessment & Plan Note (Signed)
A1C 8.6 today, up from previous -continue Lantus 30 units and NovoLog 50 units twice a day -Patient states that she will make some changes in her diet specifically she is going to stop her sodas, rather than make changes to her insulin -Patient to follow-up with Dr. Katy Fitch for ophthalmology -Return in 1-2 months

## 2017-01-12 NOTE — Assessment & Plan Note (Addendum)
Exacerbation of chronic right-sided sciatica pain, no red flags, good strength and both bilateral extremities. Will provide pain control..  - meloxicam (MOBIC) 15 MG tablet; Take 1 tablet (15 mg total) by mouth daily.  Dispense: 6 tablet; Refill: 0 - ketorolac (TORADOL) 30 MG/ML injection 30 mg; Inject 1 mL (30 mg total) into the muscle once. - tylenol as needed - orthopedic follow up  - Follow up in 1 month

## 2017-01-12 NOTE — Assessment & Plan Note (Signed)
Uncontrolled.  Recently increased spironolactone check bmet today -Continue spironolactone, Norvasc and losratan - will increase Coreg today -If no improvement will increase spironolactone at next visit

## 2017-01-13 ENCOUNTER — Ambulatory Visit
Admission: RE | Admit: 2017-01-13 | Discharge: 2017-01-13 | Disposition: A | Discharge status: Home / Self Care | Payer: Medicare Other | Source: Ambulatory Visit | Attending: Family Medicine | Admitting: Family Medicine

## 2017-01-13 DIAGNOSIS — Z1231 Encounter for screening mammogram for malignant neoplasm of breast: Secondary | ICD-10-CM

## 2017-01-13 NOTE — Telephone Encounter (Signed)
Pt informed and agreeable.  Appt for Monday.  Dr. Emmaline Life, Can you put in future orders? Fleeger, Salome Spotted, CMA

## 2017-01-14 NOTE — Addendum Note (Signed)
Addended by: [name] Z on: 01/14/2017 08:06 AM   Modules accepted: Orders

## 2017-01-14 NOTE — Telephone Encounter (Signed)
Placed future order.

## 2017-01-19 ENCOUNTER — Other Ambulatory Visit: Payer: Self-pay | Admitting: Family Medicine

## 2017-01-19 ENCOUNTER — Other Ambulatory Visit: Payer: Medicare Other

## 2017-01-19 DIAGNOSIS — I10 Essential (primary) hypertension: Secondary | ICD-10-CM

## 2017-01-22 ENCOUNTER — Other Ambulatory Visit: Payer: Medicare Other

## 2017-01-22 DIAGNOSIS — I10 Essential (primary) hypertension: Secondary | ICD-10-CM

## 2017-01-23 ENCOUNTER — Encounter: Payer: Self-pay | Admitting: Gastroenterology

## 2017-01-23 LAB — BASIC METABOLIC PANEL
BUN/Creatinine Ratio: 10 — ABNORMAL LOW (ref 12–28)
BUN: 8 mg/dL (ref 8–27)
CO2: 19 mmol/L — ABNORMAL LOW (ref 20–29)
Calcium: 9 mg/dL (ref 8.7–10.3)
Chloride: 101 mmol/L (ref 96–106)
Creatinine, Ser: 0.82 mg/dL (ref 0.57–1.00)
GFR calc Af Amer: 85 mL/min/{1.73_m2} (ref >59)
GFR calc non Af Amer: 74 mL/min/{1.73_m2} (ref >59)
Glucose: 205 mg/dL — ABNORMAL HIGH (ref 65–99)
Potassium: 4.3 mmol/L (ref 3.5–5.2)
Sodium: 140 mmol/L (ref 134–144)

## 2017-03-11 ENCOUNTER — Ambulatory Visit (INDEPENDENT_AMBULATORY_CARE_PROVIDER_SITE_OTHER): Payer: Medicare Other | Admitting: Orthopedic Surgery

## 2017-03-18 ENCOUNTER — Ambulatory Visit (INDEPENDENT_AMBULATORY_CARE_PROVIDER_SITE_OTHER): Payer: Medicare Other | Admitting: Internal Medicine

## 2017-03-18 ENCOUNTER — Encounter: Payer: Self-pay | Admitting: Internal Medicine

## 2017-03-18 ENCOUNTER — Other Ambulatory Visit: Payer: Self-pay

## 2017-03-18 DIAGNOSIS — R059 Cough, unspecified: Secondary | ICD-10-CM

## 2017-03-18 DIAGNOSIS — R05 Cough: Secondary | ICD-10-CM

## 2017-03-18 DIAGNOSIS — I10 Essential (primary) hypertension: Secondary | ICD-10-CM

## 2017-03-18 MED ORDER — HYDROCODONE-HOMATROPINE 5-1.5 MG/5ML PO SYRP: 5.0000 mL | ORAL_SOLUTION | Freq: Three times a day (TID) | ORAL | 0 refills | Status: DC | PRN

## 2017-03-18 MED ORDER — PREDNISONE 50 MG PO TABS: 50.0000 mg | ORAL_TABLET | Freq: Every day | ORAL | 0 refills | Status: DC

## 2017-03-18 MED ORDER — LEVOFLOXACIN 500 MG PO TABS: 500.0000 mg | ORAL_TABLET | Freq: Every day | ORAL | 0 refills | Status: DC

## 2017-03-18 MED ORDER — HYDROCHLOROTHIAZIDE 12.5 MG PO CAPS: 12.5000 mg | ORAL_CAPSULE | Freq: Every day | ORAL | 1 refills | Status: DC

## 2017-03-18 NOTE — Patient Instructions (Addendum)
It was nice meeting you today Ms. Serio!  Please begin taking the antibiotic (Levaquin) and the steroid (Prednisone) today. You will take these until you have taken all the tablets.   For cough, you can drink 5 mL of the Hycodan cough syrup up to 3 times a day.   Please begin taking HCTZ one 12.5mg  tablet daily to help with your blood pressure. It is very important that you come back next week to see [place] so we can make sure your blood pressure is improving.   If you have any questions or concerns, please feel free to call the clinic.   Be well,  Dr. Avon Gully

## 2017-03-18 NOTE — Progress Notes (Signed)
Subjective:   Patient: [name] [name]       Birthdate: 06-08-1950       MRN: [id number]      HPI  Othell Diluzio is a 66 y.o. female presenting for same day appt for cough. Also noted to be hypertensive.    Cough Ongoing for 3 weeks. With accompanying pain in chest and neck when coughing. Sometimes productive of dark green mucous. Also with some nasal congestion. Has taken Robitussin, Deslym, Tussinex, Nyquil, and Mucinex with no improvement in symptoms. Regular inhalers provide some relief. Has had decreased appetite but has been drinking brother. Other family members are sick as well. Says she has been febrile, with temperature consistently at 106-107F. Some day smoker; says it has been two weeks since she has smoked a cigarette.    HTN Patient with known diagnosis of HTN. Currently taking amlodipine 10mg , losartan, spironolactone, and carvedilol 25mg  BID. Has taken meds today. Endorses HA but says this has been going on since her other symptoms began. Endorses one episode of seeing spots earlier today but says she thinks this has been going on for a while, and she is not currently experiencing any vision changes.   Smoking status reviewed. Patient is current some day smoker.   Review of Systems See HPI.     Objective:  Physical Exam  Constitutional: She is oriented to person, place, and time and well-developed, well-nourished, and in no distress.  HENT:  Head: Normocephalic and atraumatic.  Nose: Nose normal.  Mouth/Throat: Oropharynx is clear and moist. No oropharyngeal exudate.  Eyes: Conjunctivae and EOM are normal. Pupils are equal, round, and reactive to light. Right eye exhibits no discharge. Left eye exhibits no discharge.  No nystagmus  Neck: Normal range of motion. Neck supple.  Cardiovascular: Normal rate, regular rhythm and normal heart sounds.  No murmur heard. Pulmonary/Chest: She has wheezes.  Coughing throughout encounter. Normal WOB on RA.   Lymphadenopathy:    She has no cervical adenopathy.  Neurological: She is alert and oriented to person, place, and time.  CN II-XII intact. 5/5 strength upper and lower extremities bilaterally.   Skin: Skin is warm and dry.  Psychiatric: Affect and judgment normal.      Assessment & Plan:  Essential hypertension BP 200/102 on arrival to clinic; 202/78 after sitting in room for ~20 min. Has taken BP meds today (amlodipine, losartan, spiro, carvedilol). From records, patient consistently hypertensive, though is higher today than usual. No neuro deficits on exam. Is endorsing HA however this seems more related to her other symptoms as has been ongoing since those symptoms began. "Speckles" in vision is not new and is not currently present as well. BP may be slightly elevated as patient is distressed about her current cough and is in discomfort when coughing, however her HTN is still poorly controlled. Will add HCTZ 12.5mg  qd and continue current regimen. F/u in one week for BP check.   Cough Likely COPD exacerbation, as patient has increased sputum production, increased cough, relief with inhalers, and wheezing. O2 sat 97% on RA and patient with normal WOB on RA and able to speak in full sentences. Wheezing noted on auscultation, however good air movement. Feel that patient is stable for outpatient management.  - Prednisone 50mg  qd x5d - Levaquin x7d - Continue current COPD med regimen and rescue inhaler PRN - Encourage complete smoking cessation - F/u in one week  Precepted with Dr. Andria Frames.   Adin Hector, MD, MPH PGY-3 [facility] Pager [phone]

## 2017-03-19 NOTE — Assessment & Plan Note (Signed)
Likely COPD exacerbation, as patient has increased sputum production, increased cough, relief with inhalers, and wheezing. O2 sat 97% on RA and patient with normal WOB on RA and able to speak in full sentences. Wheezing noted on auscultation, however good air movement. Feel that patient is stable for outpatient management.  - Prednisone 50mg  qd x5d - Levaquin x7d - Continue current COPD med regimen and rescue inhaler PRN - Encourage complete smoking cessation - F/u in one week

## 2017-03-19 NOTE — Assessment & Plan Note (Signed)
BP 200/102 on arrival to clinic; 202/78 after sitting in room for ~20 min. Has taken BP meds today (amlodipine, losartan, spiro, carvedilol). From records, patient consistently hypertensive, though is higher today than usual. No neuro deficits on exam. Is endorsing HA however this seems more related to her other symptoms as has been ongoing since those symptoms began. "Speckles" in vision is not new and is not currently present as well. BP may be slightly elevated as patient is distressed about her current cough and is in discomfort when coughing, however her HTN is still poorly controlled. Will add HCTZ 12.5mg  qd and continue current regimen. F/u in one week for BP check.

## 2017-03-27 ENCOUNTER — Ambulatory Visit (INDEPENDENT_AMBULATORY_CARE_PROVIDER_SITE_OTHER): Payer: Medicare Other | Admitting: Internal Medicine

## 2017-03-27 ENCOUNTER — Other Ambulatory Visit: Payer: Self-pay | Admitting: Internal Medicine

## 2017-03-27 ENCOUNTER — Encounter: Payer: Self-pay | Admitting: Internal Medicine

## 2017-03-27 DIAGNOSIS — I10 Essential (primary) hypertension: Secondary | ICD-10-CM | POA: Diagnosis not present

## 2017-03-27 DIAGNOSIS — B370 Candidal stomatitis: Secondary | ICD-10-CM

## 2017-03-27 DIAGNOSIS — R05 Cough: Secondary | ICD-10-CM | POA: Diagnosis not present

## 2017-03-27 DIAGNOSIS — R059 Cough, unspecified: Secondary | ICD-10-CM

## 2017-03-27 MED ORDER — NYSTATIN 100000 UNIT/ML MT SUSP: 5.0000 mL | Freq: Four times a day (QID) | OROMUCOSAL | 0 refills | Status: DC

## 2017-03-27 MED ORDER — HYDROCODONE-HOMATROPINE 5-1.5 MG/5ML PO SYRP: 5.0000 mL | ORAL_SOLUTION | Freq: Three times a day (TID) | ORAL | 0 refills | Status: DC | PRN

## 2017-03-27 NOTE — Patient Instructions (Addendum)
It was nice seeing you again today [name] [name]!  I am glad you are feeling better! You can continue to take the cough medicine as needed up to three times a day. I will see you back next week to make sure you are continuing to get better. If you feel that you are starting to get worse, or if you begin to have trouble breathing, please call our office or go to the emergency room.   Please begin using the Nystatin mouth wash 4 times a day for the next three days to help with thrush. If this has not improved by the time you have completed the mouthwash, please call to let me know.   If you have any questions or concerns, please feel free to call the clinic.   Be well,  Dr. Avon Gully

## 2017-03-27 NOTE — Assessment & Plan Note (Signed)
COPD exacerbation improving after completion course of levaquin and prednisone. Patient feels much better. Cough still present, but improving. Patient with no wheezes on auscultation today, which is improvement from last week. O2 sat 98% on RA and normal WOB on RA. Will refill Hycodan cough syrup and have patient f/u in one week. If still coughing, consider CXR or additional abx. Strict return precautions discussed.

## 2017-03-27 NOTE — Progress Notes (Signed)
Subjective:   Patient: [name] [name]       Birthdate: 1951/03/12       MRN: [id number]      HPI  [name] [name] is a 66 y.o. female presenting for BP f/u.   HTN Patient seen on 11/28 and noted to have BP to 200/102, despite taking her BP meds for the day. HCTZ 12.5mg  qd was added to her current anti-hypertensive regimen of amlodipine, losartan, spiro, and carvedilol, and she was sent home with instructions to f/u today.   Today, patient's BP improved to 160/81.  Says she has been taking HCTZ as prescribed in addition to her regular medications. Has not checked her BP at home because her son broke her BP cuff.   COPD exacerbation Presented on 11/28 for a cough with increased sputum production and wheezing. Diagnosed with COPD exacerbation and started on levaquin and prednisone. Here today for f/u. Reports improvement in symptoms since beginning antibiotics and prednisone. Still with cough productive of green sputum, but says that her wheezing has resolved and she is coughing less. Is also requiring albuterol inhaler less now, though is still using sometimes at night. Denies fevers. Has been drinking less because she thinks she has developed thrush and drinking is painful. Has had thrush before and Nystatin oral suspension works well for her. Cough syrup helped control her pain however she ran out.    Smoking status reviewed. Patient is current some day smoker.   Review of Systems See HPI.     Objective:  Physical Exam  Constitutional: She is oriented to person, place, and time and well-developed, well-nourished, and in no distress.  HENT:  Head: Normocephalic and atraumatic.  Nose: Nose normal.  White film able to be scraped off on patient's tongue. MMM, no oropharyngeal erythema or exudates.   Eyes: Conjunctivae and EOM are normal. Pupils are equal, round, and reactive to light. Right eye exhibits no discharge. Left eye exhibits no discharge.  Cardiovascular: Normal rate, regular  rhythm and normal heart sounds.  No murmur heard. Pulmonary/Chest:  Intermittent coughing, though much less frequent than at last encounter. No wheezing. Normal WOB on RA with good air movement on auscultation. Able to speak in full sentences without difficulty. O2 sat 98% on RA.   Neurological: She is alert and oriented to person, place, and time.  Skin: Skin is warm and dry.  Psychiatric: Affect and judgment normal.      Assessment & Plan:  Essential hypertension Improved after additional of HCTZ. Still above goal today at 160/81, however much better than 200/102 last week. Will continue current regimen of HCTZ, amlodipine, spiro, carvedilol, and losartan. Will monitor again at next week's f/u appt.   Cough COPD exacerbation improving after completion course of levaquin and prednisone. Patient feels much better. Cough still present, but improving. Patient with no wheezes on auscultation today, which is improvement from last week. O2 sat 98% on RA and normal WOB on RA. Will refill Hycodan cough syrup and have patient f/u in one week. If still coughing, consider CXR or additional abx. Strict return precautions discussed.   Oral thrush Exam consistent with oral thrush, as white film able to be scraped off is present on tongue. Likely 2/2 steroid use. Has history of this which typically responds well to Nystatin oral solution.  - Begin Nystatin swish QID until symptom resolution - Patient to call if symptoms not resolved with Nystatin   Adin Hector, MD, MPH PGY-3 [facility] Pager [phone]

## 2017-03-27 NOTE — Assessment & Plan Note (Signed)
Exam consistent with oral thrush, as white film able to be scraped off is present on tongue. Likely 2/2 steroid use. Has history of this which typically responds well to Nystatin oral solution.  - Begin Nystatin swish QID until symptom resolution - Patient to call if symptoms not resolved with Nystatin

## 2017-03-27 NOTE — Assessment & Plan Note (Signed)
Improved after additional of HCTZ. Still above goal today at 160/81, however much better than 200/102 last week. Will continue current regimen of HCTZ, amlodipine, spiro, carvedilol, and losartan. Will monitor again at next week's f/u appt.

## 2017-03-31 ENCOUNTER — Other Ambulatory Visit: Payer: Self-pay | Admitting: Internal Medicine

## 2017-03-31 MED ORDER — INSULIN PEN NEEDLE 31G X 8 MM MISC: 99 refills | Status: DC

## 2017-04-03 ENCOUNTER — Other Ambulatory Visit: Payer: Self-pay

## 2017-04-03 ENCOUNTER — Encounter: Payer: Self-pay | Admitting: Internal Medicine

## 2017-04-03 ENCOUNTER — Ambulatory Visit
Admission: RE | Admit: 2017-04-03 | Discharge: 2017-04-03 | Disposition: A | Discharge status: Home / Self Care | Payer: Medicare Other | Source: Ambulatory Visit | Attending: Family Medicine | Admitting: Family Medicine

## 2017-04-03 ENCOUNTER — Ambulatory Visit (INDEPENDENT_AMBULATORY_CARE_PROVIDER_SITE_OTHER): Payer: Medicare Other | Admitting: Internal Medicine

## 2017-04-03 ENCOUNTER — Telehealth: Payer: Self-pay | Admitting: Internal Medicine

## 2017-04-03 VITALS — BP 150/80 | HR 109 | Temp 98.6°F | Wt 213.2 lb

## 2017-04-03 DIAGNOSIS — R05 Cough: Secondary | ICD-10-CM | POA: Diagnosis not present

## 2017-04-03 DIAGNOSIS — B370 Candidal stomatitis: Secondary | ICD-10-CM | POA: Diagnosis not present

## 2017-04-03 DIAGNOSIS — R053 Chronic cough: Secondary | ICD-10-CM

## 2017-04-03 DIAGNOSIS — I10 Essential (primary) hypertension: Secondary | ICD-10-CM

## 2017-04-03 DIAGNOSIS — R059 Cough, unspecified: Secondary | ICD-10-CM

## 2017-04-03 MED ORDER — GUAIFENESIN 200 MG PO TABS: 200.0000 mg | ORAL_TABLET | Freq: Three times a day (TID) | ORAL | 0 refills | Status: DC | PRN

## 2017-04-03 MED ORDER — BENZONATATE 200 MG PO CAPS: 200.0000 mg | ORAL_CAPSULE | Freq: Three times a day (TID) | ORAL | 0 refills | Status: DC | PRN

## 2017-04-03 MED FILL — NYSTATIN 100,000 UNITS/ML S: 100000 | 3 days supply | Qty: 60 | Fill #0

## 2017-04-03 MED FILL — BENZONATATE 200 MG CAPSULE: 200 | 10 days supply | Qty: 30 | Fill #0

## 2017-04-03 NOTE — Progress Notes (Signed)
   Subjective:   Patient: [name] [name]       Birthdate: [birth year]       MRN: [id number]      HPI  [name] [name] is a 66 y.o. female presenting for f/u of HTN and COPD exacerbation.   Patient last seen on 12/07 for these issues.   HTN At last appt, patient's BP was still elevated, though improved from week prior after addition of HCTZ to med regimen. She was instructed to continue current regimen of HCTZ, amlodipine, spiro, carvedilol, and losartan. Home BP cuff was broken so she had not been measuring her BP at home.  Today, patient reports that her BP cuff is still broken and she has not been able to purchase a new one. Has been taking meds as prescribed. Denies HA, changes in vision.    COPD exacerbation At last visit, patient had recently completed course of levaquin and prednisone, and symptoms had improved. She still had a cough, but no longer was wheezing. Hycodan cough syrup was refilled, and patient was instructed to f/u today.  Today, patient reports continued improvement in symptoms, though is still coughing. Cough is productive of green sputum. Feels as if all the congestion is in her chest. Denies fevers. Denies SOB.    Oral thrush Patient noted to have oral thrush on exam last week. Thought likely 2/2 to steroid use. Was prescribed Nystatin mouthrinse. Says that this has significantly improved since last week with Nystatin use. Is using four times a day.    Smoking status reviewed. Patient is current some day smoker.   Review of Systems See HPI.     Objective:  Physical Exam  Constitutional: She is well-developed, well-nourished, and in no distress.  HENT:  Head: Normocephalic and atraumatic.  Nose: Nose normal.  Mouth/Throat: Oropharynx is clear and moist. No oropharyngeal exudate.  Tongue clear without signs of thrush  Cardiovascular: Normal rate, regular rhythm and normal heart sounds.  No murmur heard. Pulmonary/Chest:  Bilateral wheezing noted R>L primarily  lower lobes. Coughing frequently throughout encounter. Good air movement bilaterally. Normal WOB on RA.   Skin: Skin is warm and dry.  Psychiatric: Affect and judgment normal.      Assessment & Plan:  Essential hypertension Significantly improved this week at 150/80.  - Continue current med regimen of HCTZ, carvedilol, spiro, amlodipine   Oral thrush Significantly improved with nystatin rinse. No evidence of thrush on exam today.   Cough Improving, though still present. Has completed course of levaquin and prednisone. Remains afebrile with no SOB, however now with wheezing on exam today which had resolved last week. Normal WOB on RA and O2 sat 96% which is reassuring. Suspect patient will continue to improve, however will obtain CXR to ensure no consolidations. In the meantime, will treat symptomatically with Tessalon perles and guaifenesin (hesitant to continue Hycodan, especially as it is not controlling her cough, and feel that combo guaifenesin/Tessalon will be more effective).  - Will call patient with CXR results and determine follow up plan at that time  Adin Hector, MD, MPH PGY-3 [facility] Pager [phone]

## 2017-04-03 NOTE — Patient Instructions (Addendum)
It was nice seeing you again today Mr. Sopko!  Please begin taking the two new prescriptions (Tessalon perles and guaifenesin) together every 8 hours as needed for cough.   Please also go to [facility] to have a chest xray taken at your earliest convenience. I will call you when we have the results.   Please keep taking your blood pressure medications as you have been (amlodipine, spironolactone, carvedilol, and losartan).   We will discuss when you should follow-up next when I call with your xray results.   If you have any questions or concerns, please feel free to call the clinic.   Be well,  Dr. Avon Gully

## 2017-04-03 NOTE — Telephone Encounter (Signed)
Discussed CXR with Dr. Nori Riis. Reports says cannot exclude infiltrate on R. After viewing CXR with Dr. Nori Riis, decided on treatment plan of continuing current symptomatic management rather than starting new course of abx. Called to inform patient of results. No answer, but left message informing her that CXR was not concerning and to continue Tessalon and guaifenesin as discussed. Encouraged patient to schedule appt if no improvement in about a week.   Adin Hector, MD, MPH PGY-3 [facility] Pager [phone]

## 2017-04-03 NOTE — Assessment & Plan Note (Signed)
Significantly improved this week at 150/80.  - Continue current med regimen of HCTZ, carvedilol, spiro, amlodipine

## 2017-04-03 NOTE — Assessment & Plan Note (Signed)
Significantly improved with nystatin rinse. No evidence of thrush on exam today.

## 2017-04-03 NOTE — Assessment & Plan Note (Signed)
Improving, though still present. Has completed course of levaquin and prednisone. Remains afebrile with no SOB, however now with wheezing on exam today which had resolved last week. Normal WOB on RA and O2 sat 96% which is reassuring. Suspect patient will continue to improve, however will obtain CXR to ensure no consolidations. In the meantime, will treat symptomatically with Tessalon perles and guaifenesin (hesitant to continue Hycodan, especially as it is not controlling her cough, and feel that combo guaifenesin/Tessalon will be more effective).  - Will call patient with CXR results and determine follow up plan at that time

## 2017-05-01 ENCOUNTER — Other Ambulatory Visit: Payer: Self-pay | Admitting: Internal Medicine

## 2017-05-01 DIAGNOSIS — M5441 Lumbago with sciatica, right side: Secondary | ICD-10-CM

## 2017-05-01 DIAGNOSIS — E1142 Type 2 diabetes mellitus with diabetic polyneuropathy: Secondary | ICD-10-CM

## 2017-05-01 NOTE — Telephone Encounter (Signed)
Pt needs refill on Lyrica.  [facility] on [address]. Pt needs referall to orthopedic.  She was sent to Dr Sharol Given but she cant go back there because she had a confrontation with him.  She can go back to that practice.  Please advise

## 2017-05-07 MED ORDER — PREGABALIN 150 MG PO CAPS: 150.0000 mg | ORAL_CAPSULE | Freq: Three times a day (TID) | ORAL | 5 refills | Status: DC

## 2017-05-07 NOTE — Telephone Encounter (Signed)
Please call patient. I have placed lyrica prescription up front for her to pick up. I have also placed referral to orthopedics

## 2017-05-08 NOTE — Telephone Encounter (Signed)
Called Patient and left a voice mail message to pick up RX and referral from front office.Ozella Almond, CMA

## 2017-05-14 ENCOUNTER — Ambulatory Visit: Payer: Medicare Other | Admitting: Internal Medicine

## 2017-05-23 ENCOUNTER — Other Ambulatory Visit: Payer: Self-pay | Admitting: Internal Medicine

## 2017-05-25 ENCOUNTER — Other Ambulatory Visit: Payer: Self-pay

## 2017-05-25 DIAGNOSIS — I10 Essential (primary) hypertension: Secondary | ICD-10-CM

## 2017-05-25 MED ORDER — LOSARTAN POTASSIUM 100 MG PO TABS: 100.0000 mg | ORAL_TABLET | Freq: Every day | ORAL | 2 refills | Status: DC

## 2017-05-27 ENCOUNTER — Other Ambulatory Visit: Payer: Self-pay | Admitting: *Deleted

## 2017-05-27 DIAGNOSIS — E1142 Type 2 diabetes mellitus with diabetic polyneuropathy: Secondary | ICD-10-CM

## 2017-05-27 MED ORDER — PREGABALIN 150 MG PO CAPS: 150.0000 mg | ORAL_CAPSULE | Freq: Three times a day (TID) | ORAL | 5 refills | Status: DC

## 2017-05-28 MED ORDER — METOCLOPRAMIDE HCL 10 MG PO TABS: ORAL_TABLET | ORAL | 3 refills | Status: DC

## 2017-05-29 ENCOUNTER — Ambulatory Visit (INDEPENDENT_AMBULATORY_CARE_PROVIDER_SITE_OTHER): Payer: Medicare Other | Admitting: Internal Medicine

## 2017-05-29 VITALS — BP 130/80 | HR 96 | Temp 98.4°F | Wt 205.2 lb

## 2017-05-29 DIAGNOSIS — B372 Candidiasis of skin and nail: Secondary | ICD-10-CM

## 2017-05-29 DIAGNOSIS — I10 Essential (primary) hypertension: Secondary | ICD-10-CM | POA: Diagnosis not present

## 2017-05-29 DIAGNOSIS — E1142 Type 2 diabetes mellitus with diabetic polyneuropathy: Secondary | ICD-10-CM

## 2017-05-29 DIAGNOSIS — J302 Other seasonal allergic rhinitis: Secondary | ICD-10-CM | POA: Diagnosis not present

## 2017-05-29 DIAGNOSIS — J320 Chronic maxillary sinusitis: Secondary | ICD-10-CM | POA: Diagnosis not present

## 2017-05-29 MED ORDER — AMOXICILLIN-POT CLAVULANATE 875-125 MG PO TABS: 1.0000 | ORAL_TABLET | Freq: Two times a day (BID) | ORAL | 0 refills | Status: DC

## 2017-05-29 MED ORDER — FLUTICASONE PROPIONATE 50 MCG/ACT NA SUSP: 2.0000 | Freq: Every day | NASAL | 6 refills | Status: DC

## 2017-05-29 MED ORDER — PREGABALIN 150 MG PO CAPS: 150.0000 mg | ORAL_CAPSULE | Freq: Three times a day (TID) | ORAL | 5 refills | Status: DC

## 2017-05-29 MED ORDER — HYDROCHLOROTHIAZIDE 12.5 MG PO CAPS: 12.5000 mg | ORAL_CAPSULE | Freq: Every day | ORAL | 3 refills | Status: DC

## 2017-05-29 MED ORDER — METOCLOPRAMIDE HCL 10 MG PO TABS: ORAL_TABLET | ORAL | 3 refills | Status: DC

## 2017-05-29 MED ORDER — NYSTATIN 100000 UNIT/GM EX POWD: Freq: Three times a day (TID) | CUTANEOUS | 3 refills | Status: DC

## 2017-05-29 MED ORDER — CETIRIZINE HCL 10 MG PO TABS: 10.0000 mg | ORAL_TABLET | Freq: Two times a day (BID) | ORAL | 5 refills | Status: DC

## 2017-05-29 MED ORDER — AMLODIPINE BESYLATE 10 MG PO TABS: 10.0000 mg | ORAL_TABLET | Freq: Every day | ORAL | 3 refills | Status: DC

## 2017-05-29 NOTE — Progress Notes (Signed)
   [facility] [name], MD Phone: [phone]  Reason For Visit: Sick visit plus follow-up blood pressure  #Sinusitis -With 3 weeks of sinusitis,  -States she has had headaches, sinus pain, congestion -with yellow-green mucus for the past 3 weeks -Endorses cough -She endorses having subjective fevers -She states that she was treated by Dr. Avon Gully for cough back in December on the 14th however never really recovered  #CHRONIC HTN: Has been seen Dr. Avon Gully for elevated blood pressures.  Dr. Avon Gully recently placed her on hydrochlorothiazide Current Meds - Spirlactone, coreg, HCTZ, losartan, Norvasc  Reports good compliance, took meds today. Tolerating well, w/o complaints. Lifestyle -dates she has not really been doing much due to the sinusitis Denies CP, dyspnea, HA, edema, dizziness / lightheadedness  Past Medical History Reviewed problem list.  Medications- reviewed and updated No additions to family history Social history- patient is a non- smoker  Objective: BP 130/80 (BP Location: Right Arm, Patient Position: Sitting, Cuff Size: Normal)   Pulse 96   Temp 98.4 F (36.9 C) (Oral)   Wt 205 lb 3.2 oz (93.1 kg)   BMI 36.35 kg/m  Gen: NAD, alert, cooperative with exam HEENT: Endorses maxillary and ethmoid sinus tenderness on palpation    Neck: No masses palpated. No lymphadenopathy    Ears: Tympanic membranes intact, normal light reflex, no erythema, no bulging    Eyes: Normal conjunctiva    Nose: nasal turbinates congested    Throat: Cobblestoning noted in the back of throat, postnasal drip, no erythema Cardio: regular rate and rhythm, S1S2 heard, no murmurs appreciated Pulm: clear to auscultation bilaterally, no wheezes, rhonchi or rales Skin: dry, intact, no rashes or lesions  Assessment/Plan: See problem based a/p  Sinusitis, chronic Chronic sinusitis given history and physical will treat with antibiotics. - fluticasone  (FLONASE) 50 MCG/ACT nasal spray; Place 2 sprays into both nostrils daily.  Dispense: 16 g; Refill: 6 - cetirizine (ZYRTEC) 10 MG tablet; Take 1 tablet (10 mg total) by mouth 2 (two) times daily.  Dispense: 60 tablet; Refill: 5 - amoxicillin-clavulanate (AUGMENTIN) 875-125 MG tablet; Take 1 tablet by mouth 2 (two) times daily.  Dispense: 20 tablet; Refill: 0 - cetirizine (ZYRTEC) 10 MG tablet; Take 1 tablet (10 mg total) by mouth 2 (two) times daily.  Dispense: 60 tablet; Refill: 5 - Follow up in two weeks   Essential hypertension Difficult to control blood pressure now well controlled at this visit. blood pressure previously elevated.  Patient was seen by Dr. Avon Gully who placed her hydrochlorothiazide 12.5 mg daily -Continue hydrochlorothiazide 12.5 mg, Spironolactone 25 mg, losartan 100 mg, Coreg 25 mg, amlodipine 10 mg -Follow-up in 2 weeks

## 2017-05-29 NOTE — Progress Notes (Signed)
a1c

## 2017-05-29 NOTE — Patient Instructions (Signed)
I am going to prescribe you Augmentin to help with this chronic sinus infection that you have been having.  Please make sure to take Zyrtec as well as Flonase to help with the symptoms this will dry you up.  Please follow-up with me in 2 weeks for further discussion of your health including your diabetes

## 2017-05-31 NOTE — Assessment & Plan Note (Signed)
Chronic sinusitis given history and physical will treat with antibiotics. - fluticasone (FLONASE) 50 MCG/ACT nasal spray; Place 2 sprays into both nostrils daily.  Dispense: 16 g; Refill: 6 - cetirizine (ZYRTEC) 10 MG tablet; Take 1 tablet (10 mg total) by mouth 2 (two) times daily.  Dispense: 60 tablet; Refill: 5 - amoxicillin-clavulanate (AUGMENTIN) 875-125 MG tablet; Take 1 tablet by mouth 2 (two) times daily.  Dispense: 20 tablet; Refill: 0 - cetirizine (ZYRTEC) 10 MG tablet; Take 1 tablet (10 mg total) by mouth 2 (two) times daily.  Dispense: 60 tablet; Refill: 5 - Follow up in two weeks

## 2017-05-31 NOTE — Assessment & Plan Note (Signed)
Difficult to control blood pressure now well controlled at this visit. blood pressure previously elevated.  Patient was seen by Dr. Avon Gully who placed her hydrochlorothiazide 12.5 mg daily -Continue hydrochlorothiazide 12.5 mg, Spironolactone 25 mg, losartan 100 mg, Coreg 25 mg, amlodipine 10 mg -Follow-up in 2 weeks

## 2017-06-15 ENCOUNTER — Ambulatory Visit (INDEPENDENT_AMBULATORY_CARE_PROVIDER_SITE_OTHER): Payer: Medicare Other | Admitting: Internal Medicine

## 2017-06-15 VITALS — BP 130/80 | HR 99 | Temp 99.0°F | Wt 205.2 lb

## 2017-06-15 DIAGNOSIS — E1169 Type 2 diabetes mellitus with other specified complication: Secondary | ICD-10-CM | POA: Diagnosis not present

## 2017-06-15 DIAGNOSIS — R1032 Left lower quadrant pain: Secondary | ICD-10-CM | POA: Diagnosis not present

## 2017-06-15 DIAGNOSIS — E785 Hyperlipidemia, unspecified: Secondary | ICD-10-CM | POA: Diagnosis not present

## 2017-06-15 DIAGNOSIS — J320 Chronic maxillary sinusitis: Secondary | ICD-10-CM

## 2017-06-15 DIAGNOSIS — K219 Gastro-esophageal reflux disease without esophagitis: Secondary | ICD-10-CM

## 2017-06-15 DIAGNOSIS — I10 Essential (primary) hypertension: Secondary | ICD-10-CM | POA: Diagnosis not present

## 2017-06-15 DIAGNOSIS — Z716 Tobacco abuse counseling: Secondary | ICD-10-CM

## 2017-06-15 MED ORDER — HYDROCHLOROTHIAZIDE 12.5 MG PO CAPS: 12.5000 mg | ORAL_CAPSULE | Freq: Every day | ORAL | 3 refills | Status: DC

## 2017-06-15 MED ORDER — LOSARTAN POTASSIUM 100 MG PO TABS
100.0000 mg | ORAL_TABLET | Freq: Every day | ORAL | 2 refills | Status: DC
Start: 2017-06-15 — End: 2018-09-21

## 2017-06-15 MED ORDER — ROSUVASTATIN CALCIUM 20 MG PO TABS: ORAL_TABLET | ORAL | 3 refills | Status: DC

## 2017-06-15 MED ORDER — RANITIDINE HCL 150 MG PO TABS: 150.0000 mg | ORAL_TABLET | Freq: Two times a day (BID) | ORAL | 5 refills | Status: DC

## 2017-06-15 NOTE — Progress Notes (Signed)
   [facility] [name], MD Phone: [phone]  Reason For Visit: LLQ pain and   # LLQ pain  - states that the pain and goes down into left mid thigh  - patient states she feels that there is a ball there  - Has been going on for about 2 months  - Patient denies any fevers or chills, has felt some weight loss since being sick with congestion  - Patient denies any hx of cancer   - Family know known hx of ovarian cancer   #Chronic Maxillary Sinusitis  -She has received 2 doses of antibiotics. She still is complaining of nasal congestion and cough.  Patient currently indicates that she still has some pain in herbilateral maxillary sinuses.  Has been using the Flonase once in the morning once at night.  She is also been taking Zyrtec daily.  She denies that any of this has significantly helped her symptoms  Past Medical History Reviewed problem list.  Medications- reviewed and updated No additions to family history Social history- patient is a non-smoker  Objective: BP 130/80   Pulse 99   Temp 99 F (37.2 C) (Oral)   Wt 205 lb 3.2 oz (93.1 kg)   SpO2 99%   BMI 36.35 kg/m  Gen: NAD, alert, cooperative with exam HEENT: Normal, notable for maxillary tenderness    Nose: nasal turbinates congested Pulm: clear to auscultation bilaterally, no wheezes, rhonchi or rales GI: soft, non-tender, non-distended, left lower quadrant tenderness, patient endorses feeling a mass Extremities: warm, well perfused, No edema MSK: Normal gait and station   Assessment/Plan: See problem based a/p  Sinusitis, chronic Continues to have maxillary tenderness and nasal congestion.  Despite being treated with Augmentin for 10 days, Flonase, Zyrtec. Therefore Will refer patient to ENT for further workup - Ambulatory referral to ENT  Abdominal pain, left lower quadrant Left lower quadrant pain.  Denies any significant constipation.  States that this is been going on for 2  months.  She sometimes feels like a ball in her left lower quadrant.  Possibly feel a mass versus nothing.  It is difficult to evaluate patient as she has a large pannus.  Possible concerns include ovarian mass which is highly highly unlikely.  Constipation, the patient denies being constipated.  Abdominal mass. - US transvaginal  - US Pelvis Complete; Future - DG Abd 1 View; Future - US Abdomen Limited; Future

## 2017-06-15 NOTE — Patient Instructions (Signed)
I see you today.  I am going to place orders for you to get ultrasounds.  Please make sure to follow-up in about 2-3 weeks following these ultrasound results.

## 2017-06-16 NOTE — Assessment & Plan Note (Signed)
Continues to have maxillary tenderness and nasal congestion.  Despite being treated with Augmentin for 10 days, Flonase, Zyrtec. Therefore Will refer patient to ENT for further workup - Ambulatory referral to ENT

## 2017-06-17 ENCOUNTER — Telehealth: Payer: Self-pay

## 2017-06-17 DIAGNOSIS — R1032 Left lower quadrant pain: Secondary | ICD-10-CM

## 2017-06-17 NOTE — Addendum Note (Signed)
Addended by: [name] Z on: 06/17/2017 12:08 PM   Modules accepted: Orders

## 2017-06-17 NOTE — Telephone Encounter (Signed)
Yes please schedule for transvaginal ultrasound as well. I have placed order. Thanks Endya Austin

## 2017-06-17 NOTE — Telephone Encounter (Signed)
Dr. Emmaline Life, Tried to schedule appointment for ultrasound and radiology needs more information as to what you are trying to rule out. I looked at your notes for the visit but was not able to determine it.  She may need a pelvic complete transvaginal but it depends on the reason per the scheduler.Ozella Almond, CMA

## 2017-06-18 ENCOUNTER — Encounter: Payer: Self-pay | Admitting: Internal Medicine

## 2017-06-18 NOTE — Assessment & Plan Note (Addendum)
Left lower quadrant pain.  Denies any significant constipation.  States that this is been going on for 2 months.  She sometimes feels like a ball in her left lower quadrant.  Possibly feel a mass versus nothing.  It is difficult to evaluate patient as she has a large pannus.  Possible concerns include ovarian mass which is highly highly unlikely.  Constipation, the patient denies being constipated.  Abdominal mass. - US transvaginal  - US Pelvis Complete; Future - DG Abd 1 View; Future - US Abdomen Limited; Future - Unable to get labs today due transportation  - Follow up in 2-3 weeks following imaging

## 2017-06-22 NOTE — Telephone Encounter (Signed)
Called patient with her  appointment on 06/24/2017 Ultrasound and she said that the time was not going to work for her so she was give the scheduler's number to change it to a more convenient time.[name] [name], [place]

## 2017-06-24 ENCOUNTER — Ambulatory Visit (HOSPITAL_COMMUNITY): Payer: Medicare Other

## 2017-06-25 ENCOUNTER — Ambulatory Visit (HOSPITAL_COMMUNITY)
Admission: RE | Admit: 2017-06-25 | Discharge: 2017-06-25 | Disposition: A | Discharge status: Home / Self Care | Payer: Medicare Other | Source: Ambulatory Visit | Attending: Family Medicine | Admitting: Family Medicine

## 2017-06-25 DIAGNOSIS — Z90710 Acquired absence of both cervix and uterus: Secondary | ICD-10-CM | POA: Insufficient documentation

## 2017-06-25 DIAGNOSIS — R1032 Left lower quadrant pain: Secondary | ICD-10-CM | POA: Diagnosis present

## 2017-06-26 ENCOUNTER — Telehealth: Payer: Self-pay | Admitting: Internal Medicine

## 2017-06-26 NOTE — Telephone Encounter (Signed)
Pt informed. Gibran Veselka, CMA  

## 2017-06-26 NOTE — Telephone Encounter (Signed)
Please call patient and let her know that her ultrasound results are completely normal

## 2017-07-03 ENCOUNTER — Other Ambulatory Visit: Payer: Self-pay | Admitting: Internal Medicine

## 2017-07-03 DIAGNOSIS — R1032 Left lower quadrant pain: Secondary | ICD-10-CM

## 2017-07-13 ENCOUNTER — Ambulatory Visit
Admission: RE | Admit: 2017-07-13 | Discharge: 2017-07-13 | Disposition: A | Discharge status: Home / Self Care | Payer: Medicare Other | Source: Ambulatory Visit | Attending: Family Medicine | Admitting: Family Medicine

## 2017-07-13 DIAGNOSIS — R1032 Left lower quadrant pain: Secondary | ICD-10-CM

## 2017-07-14 ENCOUNTER — Telehealth: Payer: Self-pay | Admitting: Internal Medicine

## 2017-07-14 DIAGNOSIS — J329 Chronic sinusitis, unspecified: Secondary | ICD-10-CM | POA: Insufficient documentation

## 2017-07-14 NOTE — Telephone Encounter (Signed)
Called to discuss ultrasound results. Patient to come in for follow up.

## 2017-07-27 ENCOUNTER — Ambulatory Visit: Payer: Medicare Other | Admitting: Internal Medicine

## 2017-07-29 ENCOUNTER — Ambulatory Visit (INDEPENDENT_AMBULATORY_CARE_PROVIDER_SITE_OTHER): Payer: Self-pay

## 2017-07-29 ENCOUNTER — Ambulatory Visit (INDEPENDENT_AMBULATORY_CARE_PROVIDER_SITE_OTHER): Payer: Medicare Other

## 2017-07-29 ENCOUNTER — Ambulatory Visit (INDEPENDENT_AMBULATORY_CARE_PROVIDER_SITE_OTHER): Payer: Medicare Other | Admitting: Orthopaedic Surgery

## 2017-07-29 DIAGNOSIS — M25552 Pain in left hip: Secondary | ICD-10-CM

## 2017-07-29 DIAGNOSIS — M545 Low back pain: Secondary | ICD-10-CM

## 2017-07-29 DIAGNOSIS — M25551 Pain in right hip: Secondary | ICD-10-CM

## 2017-07-29 MED ORDER — TRAMADOL HCL 50 MG PO TABS: 50.0000 mg | ORAL_TABLET | Freq: Three times a day (TID) | ORAL | 0 refills | Status: DC | PRN

## 2017-07-29 MED ORDER — NABUMETONE 500 MG PO TABS: 500.0000 mg | ORAL_TABLET | Freq: Two times a day (BID) | ORAL | 0 refills | Status: DC | PRN

## 2017-07-29 MED ORDER — TIZANIDINE HCL 4 MG PO TABS: 4.0000 mg | ORAL_TABLET | Freq: Three times a day (TID) | ORAL | 0 refills | Status: DC | PRN
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
## 2017-08-03 ENCOUNTER — Ambulatory Visit (INDEPENDENT_AMBULATORY_CARE_PROVIDER_SITE_OTHER): Payer: Medicare Other | Admitting: Internal Medicine

## 2017-08-03 ENCOUNTER — Other Ambulatory Visit: Payer: Self-pay

## 2017-08-03 ENCOUNTER — Encounter: Payer: Self-pay | Admitting: Internal Medicine

## 2017-08-03 VITALS — BP 162/70 | HR 101 | Temp 99.0°F | Ht 63.0 in | Wt 211.2 lb

## 2017-08-03 DIAGNOSIS — G44209 Tension-type headache, unspecified, not intractable: Secondary | ICD-10-CM

## 2017-08-03 DIAGNOSIS — R1084 Generalized abdominal pain: Secondary | ICD-10-CM

## 2017-08-03 DIAGNOSIS — R1032 Left lower quadrant pain: Secondary | ICD-10-CM

## 2017-08-03 DIAGNOSIS — E1142 Type 2 diabetes mellitus with diabetic polyneuropathy: Secondary | ICD-10-CM

## 2017-08-03 DIAGNOSIS — J302 Other seasonal allergic rhinitis: Secondary | ICD-10-CM | POA: Diagnosis not present

## 2017-08-03 DIAGNOSIS — J320 Chronic maxillary sinusitis: Secondary | ICD-10-CM

## 2017-08-03 LAB — POCT SEDIMENTATION RATE: POCT SED RATE: 15 mm/hr (ref 0–22)

## 2017-08-03 LAB — POCT UA - GLUCOSE/PROTEIN
Glucose, UA: NEGATIVE
Protein, UA: NEGATIVE

## 2017-08-03 LAB — POCT GLYCOSYLATED HEMOGLOBIN (HGB A1C): Hemoglobin A1C: 6.9

## 2017-08-03 MED ORDER — ALBUTEROL SULFATE (2.5 MG/3ML) 0.083% IN NEBU: 2.5000 mg | INHALATION_SOLUTION | Freq: Four times a day (QID) | RESPIRATORY_TRACT | 3 refills | Status: DC | PRN

## 2017-08-03 MED ORDER — PREGABALIN 150 MG PO CAPS: 150.0000 mg | ORAL_CAPSULE | Freq: Three times a day (TID) | ORAL | 5 refills | Status: DC

## 2017-08-03 MED ORDER — INSULIN PEN NEEDLE 31G X 8 MM MISC: 99 refills | Status: DC

## 2017-08-03 MED ORDER — CETIRIZINE HCL 10 MG PO TABS: 10.0000 mg | ORAL_TABLET | Freq: Two times a day (BID) | ORAL | 5 refills | Status: DC

## 2017-08-03 MED ORDER — BUTALBITAL-APAP-CAFFEINE 50-325-40 MG PO TABS: 1.0000 | ORAL_TABLET | Freq: Three times a day (TID) | ORAL | 0 refills | Status: DC | PRN

## 2017-08-03 NOTE — Progress Notes (Signed)
   [facility] [name], MD Phone: [phone]  Reason For Visit: SDA for abdominal pain and headaches  #Abdominal pain Patient continues to have intermittent abdominal pain which she states is bothersome. She says is been going on for about a year.  She denies being constipated states she has a bowel movement daily states that it is soft.  She indicates sometimes having nausea associated with this abdominal pain.  She denies any vomiting.  She denies any decrease in appetite.  She denies any association with food.  She denies any specific location states that it is all over at times. Denies any radiation of the pain..  She denies any fevers. Denies any pain with urination  #Headaches Patient states that she has a history of headaches and recently has been having worsening headaches.  She states that these happened about 2-3 times a week.  She indicates that she is also been having chronic sinus issues and has been seeing ENT for this.  She still feels that she is congested.  She indicates that the headaches are all around her head.  She does endorse having headaches sometimes behind her eyes.  She states that she did recently about 2 days ago see her ophthalmologist who did change her glasses  ROS:  Fever/Weight loss: None  Neurologic Impairment: None Sudden onset (age > 33) : None  Headache awakening from sleep: None  Nausea/Vomitting: Endorses sometimes having  Nausea, no vomiting   Worsening Progression of Headache: recently worse  Trauma: None   Past Medical History Reviewed problem list.  Medications- reviewed and updated No additions to family history  Objective: BP (!) 162/70 (BP Location: Left Arm, Patient Position: Sitting, Cuff Size: Large)   Pulse (!) 101   Temp 99 F (37.2 C) (Oral)   Ht _0  (1.6 m)   Wt 211 lb 3.2 oz (95.8 kg)   SpO2 98%   BMI 37.41 kg/m  Gen: NAD, alert, cooperative with exam Cardio: regular rate and rhythm, S1S2  heard, no murmurs appreciated GI: soft, non-tender, non-distended, bowel sounds present, no hepatomegaly, no splenomegaly Neurologic exam : Cn 2-7 intact Strength equal & normal in upper & lower extremities Able to walk on heels and toes.   Balance normal, finger to nose   Assessment/Plan: See problem based a/p  Abdominal pain Patient states that she has generalized abdominal pain that comes and goes and is bothersome at times, given benign exam unlikely pancreatitis  Given she comes in regularly for this issues with previously with normal imaging and blood work more concerning for abdominal migranes vs. a component of her fibromyalgai.  She states that she takes MiraLAX regularly and is not constipated.  -Provided reassurance to patient -Indicated that she does need to get her screening colonoscopy which she states that she is working on -Follow-up as needed for this issue   Headache Likely tension headache. Generally relieved by laying down for couple of hours.  No neurologic findings.  no red flags patient does indicate having temporal tenderness however she tender everywhere I touch her on her scalp. -Will obtain an ESR just to rule out temporal arteritis -Continue Tylenol as needed -Watch your caffeine, increase fluid, watch for any triggers for your headaches -Fioricet for any severe headaches -Follow-up with ENT-as your chronic sinusitis could be contributing

## 2017-08-03 NOTE — Patient Instructions (Addendum)
Please follow up in two weeks. At your next appointment we are specifically going to talk about your chronic medical issues

## 2017-08-05 NOTE — Assessment & Plan Note (Deleted)
Patient states that she has generalized abdominal pain that comes and goes and is bothersome at times I think this is likely abdominal migraine.  She has been discontinued previously for this issue with normal imaging and blood work.  She states that she takes MiraLAX regularly and is not constipated.  -Provided reassurance to patient -Indicated that she does need to get her screening colonoscopy which she states that she is working on -Follow-up as needed for this issue

## 2017-08-05 NOTE — Assessment & Plan Note (Addendum)
Patient states that she has generalized abdominal pain that comes and goes and is bothersome at times, given benign exam unlikely pancreatitis  Given she comes in regularly for this issues with previously with normal imaging and blood work more concerning for abdominal migranes vs. a component of her fibromyalgai.  She states that she takes MiraLAX regularly and is not constipated.  -Provided reassurance to patient -Indicated that she does need to get her screening colonoscopy which she states that she is working on -Follow-up as needed for this issue

## 2017-08-05 NOTE — Assessment & Plan Note (Addendum)
Likely tension headache. Generally relieved by laying down for couple of hours.  No neurologic findings.  no red flags patient does indicate having temporal tenderness however she tender everywhere I touch her on her scalp. -Will obtain an ESR just to rule out temporal arteritis -Continue Tylenol as needed -Watch your caffeine, increase fluid, watch for any triggers for your headaches -Fioricet for any severe headaches -Follow-up with ENT-as your chronic sinusitis could be contributing

## 2017-08-08 ENCOUNTER — Encounter: Payer: Self-pay | Admitting: Internal Medicine

## 2017-08-12 ENCOUNTER — Ambulatory Visit (INDEPENDENT_AMBULATORY_CARE_PROVIDER_SITE_OTHER): Payer: Medicare Other | Admitting: Orthopaedic Surgery

## 2017-08-14 ENCOUNTER — Encounter: Payer: Self-pay | Admitting: Internal Medicine

## 2017-08-14 ENCOUNTER — Other Ambulatory Visit: Payer: Self-pay

## 2017-08-14 ENCOUNTER — Ambulatory Visit (INDEPENDENT_AMBULATORY_CARE_PROVIDER_SITE_OTHER): Payer: Medicare Other | Admitting: Internal Medicine

## 2017-08-14 VITALS — BP 156/66 | HR 113 | Temp 98.9°F | Ht 63.0 in | Wt 204.6 lb

## 2017-08-14 DIAGNOSIS — I10 Essential (primary) hypertension: Secondary | ICD-10-CM | POA: Diagnosis not present

## 2017-08-14 DIAGNOSIS — E1169 Type 2 diabetes mellitus with other specified complication: Secondary | ICD-10-CM

## 2017-08-14 DIAGNOSIS — E1142 Type 2 diabetes mellitus with diabetic polyneuropathy: Secondary | ICD-10-CM

## 2017-08-14 DIAGNOSIS — E785 Hyperlipidemia, unspecified: Secondary | ICD-10-CM | POA: Diagnosis not present

## 2017-08-14 MED ORDER — HYDROCHLOROTHIAZIDE 25 MG PO TABS: 25.0000 mg | ORAL_TABLET | Freq: Every day | ORAL | 3 refills | Status: DC

## 2017-08-14 MED ORDER — INSULIN LISPRO 100 UNIT/ML ~~LOC~~ SOLN: 15.0000 [IU] | SUBCUTANEOUS | 11 refills | Status: DC | PRN

## 2017-08-14 NOTE — Patient Instructions (Signed)
Please follow up in about 1 month in May. Please check your blood sugars in the morning to see if you have any low blood sugars If you have a blood sugar below 70 please call. I am going to increase your HCTZ to 25 mg, please start taking this medications.

## 2017-08-14 NOTE — Assessment & Plan Note (Addendum)
A1C 6.9  - Lantus 27 units BID  - Notes some diaphoresis in the AM, unsure if this hypoglycemia or not - Patient to plan on checking blood sugars -Patient to call clinic if CBGs below 70 though she denies that ever being the case -she states her CBGs are always 200s and above -Follow-up in 1 month -could consider adding on some oral medications, will discuss with Dr. Valentina Lucks -Needs pneumonia shot, tetanus and eye exam at next visit

## 2017-08-14 NOTE — Progress Notes (Signed)
   [name] [name] Mo, MD Phone: [phone]  Reason For Visit: Follow up   # CHRONIC HTN: Current Meds - Norvasc, Losartan, HCTZ   Reports good compliance, took meds today. Tolerating well, w/o complaints. Lifestyle - Discussed salt intake and weight loss  Denies CP, dyspnea, HA, edema, dizziness / lightheadedness  #CHRONIC DM, Type 2: CBGs: Check in the morning -sometimes in evening.  -  Checks blood sugars in morning before she eats, they over 200 Meds: Lantus 27 units in the AM and PM, Novolog 15 units, maybe once a month with full meal.  Reports good compliance. Tolerating well w/o side-effects Currently on ACEi / ARB  Does state sometimes she feels sweaty in the morning she is never checked her blood sugars in the morning by the time she does check her blood sugars they are usually in the 200s. Denies palpations, fatigue, weakness, jittery  #HYPERLIPIDEMIA Disease Monitoring: See symptoms for Hypertension Medications: Crestor   Past Medical History Reviewed problem list.  Medications- reviewed and updated No additions to family history Social history- patient is a non- smoker  Objective: BP (!) 156/66   Pulse (!) 113   Temp 98.9 F (37.2 C) (Oral)   Ht 5\' 3"  (1.6 m)   Wt 204 lb 9.6 oz (92.8 kg)   SpO2 99%   BMI 36.24 kg/m  Gen: NAD, alert, cooperative with exam Cardio: regular rate and rhythm, S1S2 heard, no murmurs appreciated Pulm: clear to auscultation bilaterally, no wheezes, rhonchi or rales Extremities: warm, well perfused, No edema, cyanosis or clubbing;  Skin: dry, intact, no rashes or lesions  Assessment/Plan: See problem based a/p  DM type 2 with diabetic peripheral neuropathy (HCC) A1C 6.9  - Lantus 27 units BID  - Notes some diaphoresis in the AM, unsure if this hypoglycemia or not - Patient to plan on checking blood sugars -Patient to call clinic if CBGs below 70 though she denies that ever being the case -she  states her CBGs are always 200s and above -Follow-up in 1 month -could consider adding on some oral medications, will discuss with Dr. Valentina [name] -Needs pneumonia shot, tetanus and eye exam at next visit  Essential hypertension Blood pressure still elevated patient not currently taking spironolactone - will continue Norvas,  Losartan  - will increase hydrochlorothiazide to 25 mg -Follow-up in 1 month  Dyslipidemia associated with type 2 diabetes mellitus No issues Continue Crestor

## 2017-08-14 NOTE — Assessment & Plan Note (Signed)
Blood pressure still elevated patient not currently taking spironolactone - will continue Norvas,  Losartan  - will increase hydrochlorothiazide to 25 mg -Follow-up in 1 month

## 2017-08-14 NOTE — Assessment & Plan Note (Signed)
No issues Continue Crestor

## 2017-08-15 ENCOUNTER — Other Ambulatory Visit (INDEPENDENT_AMBULATORY_CARE_PROVIDER_SITE_OTHER): Payer: Self-pay | Admitting: Orthopaedic Surgery

## 2017-08-17 NOTE — Telephone Encounter (Signed)
Please advise 

## 2017-08-25 ENCOUNTER — Other Ambulatory Visit (INDEPENDENT_AMBULATORY_CARE_PROVIDER_SITE_OTHER): Payer: Self-pay | Admitting: Orthopaedic Surgery

## 2017-08-28 ENCOUNTER — Encounter (INDEPENDENT_AMBULATORY_CARE_PROVIDER_SITE_OTHER): Payer: Self-pay | Admitting: *Deleted

## 2017-09-08 ENCOUNTER — Ambulatory Visit (INDEPENDENT_AMBULATORY_CARE_PROVIDER_SITE_OTHER): Payer: Medicare Other | Admitting: Orthopaedic Surgery

## 2017-09-09 ENCOUNTER — Ambulatory Visit (INDEPENDENT_AMBULATORY_CARE_PROVIDER_SITE_OTHER): Payer: Medicare Other | Admitting: Orthopaedic Surgery

## 2017-09-12 ENCOUNTER — Ambulatory Visit
Admission: RE | Admit: 2017-09-12 | Discharge: 2017-09-12 | Disposition: A | Discharge status: Home / Self Care | Payer: Medicare Other | Source: Ambulatory Visit | Attending: Orthopaedic Surgery | Admitting: Orthopaedic Surgery

## 2017-09-12 DIAGNOSIS — M545 Low back pain: Secondary | ICD-10-CM

## 2017-09-21 ENCOUNTER — Ambulatory Visit (INDEPENDENT_AMBULATORY_CARE_PROVIDER_SITE_OTHER): Payer: Medicare Other | Admitting: Orthopaedic Surgery

## 2017-09-29 ENCOUNTER — Telehealth (INDEPENDENT_AMBULATORY_CARE_PROVIDER_SITE_OTHER): Payer: Self-pay | Admitting: Orthopaedic Surgery

## 2017-09-29 ENCOUNTER — Other Ambulatory Visit (INDEPENDENT_AMBULATORY_CARE_PROVIDER_SITE_OTHER): Payer: Self-pay

## 2017-09-29 MED ORDER — TIZANIDINE HCL 4 MG PO TABS: 4.0000 mg | ORAL_TABLET | Freq: Three times a day (TID) | ORAL | 0 refills | Status: DC | PRN

## 2017-09-29 MED ORDER — NABUMETONE 500 MG PO TABS: 500.0000 mg | ORAL_TABLET | Freq: Two times a day (BID) | ORAL | 0 refills | Status: DC | PRN

## 2017-09-29 NOTE — Telephone Encounter (Signed)
Sent in to pharmacy.  

## 2017-09-29 NOTE — Telephone Encounter (Signed)
Patient called needing Rx refilled (Nabumetone and Tizanidine) Patient said she uses the [facility] on [address]. The number to contact patient is [phone] Home or [phone] cell.

## 2017-10-08 ENCOUNTER — Ambulatory Visit (INDEPENDENT_AMBULATORY_CARE_PROVIDER_SITE_OTHER): Payer: Medicare Other | Admitting: Physician Assistant

## 2017-10-17 ENCOUNTER — Other Ambulatory Visit (INDEPENDENT_AMBULATORY_CARE_PROVIDER_SITE_OTHER): Payer: Self-pay | Admitting: Physician Assistant

## 2017-10-19 NOTE — Telephone Encounter (Signed)
Please advise 

## 2017-10-30 ENCOUNTER — Other Ambulatory Visit (INDEPENDENT_AMBULATORY_CARE_PROVIDER_SITE_OTHER): Payer: Self-pay | Admitting: Physician Assistant

## 2017-12-01 NOTE — Progress Notes (Addendum)
Subjective:    Patient ID: [name] [name], female    DOB: 02/08/1951, 67 y.o.   MRN: [id number]   CC: headache, abdominal pain   HPI: Headache Patient today presenting due to headaches.  Patient states they occur in the top of her head, her temples, and sometimes to her neck.  Patient has taken Fioricet at home but ran out of her pills recently.  She says the Fioricet takes away her headache.  Patient describes them as "pounding and throbbing".  Patient says she sometimes gets blurry vision with these headaches.  Patient reports photo and phonophobia associated.  Patient says she usually lays down when she has these headaches to help improve them.  Patient does report nausea with headaches and sometimes vomiting.  Abdominal pain Patient reports abdominal pain today says it is a "burning "feeling.  Patient has taken pantoprazole in the past and said this works.  Patient was switched from pantoprazole to Zantac and says that the Zantac does not help.  Patient says abdominal pain is generally generalized but sometimes is worse at the "bottom" of her stomach and sometimes the left upper quadrant.  Patient says pain is worse at nighttime but is not affected by positional changes.  Patient says pain is worse with spicy or acidic foods.  Hypertension: - Medications: Losartan 100 mg a day, amlodipine 10 mg a day, hydrochlorothiazide 25 mg a day - Compliance: Good, took her medications this morning - Checking BP at home: Does not check blood pressures at home as she needs a new blood pressure cuff as her old cuff broke -Endorses SOB, CP, vision changes, LE edema.  Denies medication SEs, or symptoms of hypotension - Diet: Patient likes to eat scrambled eggs, noodles that are packaged, canned goods, bakes meat - Exercise: Walks daily   Objective:  BP (!) 176/90   Pulse 64   Temp 98.8 F (37.1 C) (Oral)   Ht 5\' 3"  (1.6 m)   Wt 202 lb 12.8 oz (92 kg)   SpO2 98%   BMI 35.92 kg/m  Vitals and  nursing note reviewed  General: well nourished, in no acute distress HEENT: normocephalic, PERRL, moist mucous membranes, good dentition without erythema or discharge noted in posterior oropharynx Neck: supple, non-tender, without lymphadenopathy Cardiac: RRR, clear S1 and S2, no murmurs, rubs, or gallops Respiratory: clear to auscultation bilaterally, no increased work of breathing Abdomen: soft, nontender, nondistended, no masses or organomegaly. Bowel sounds present Extremities: no edema or cyanosis. Warm, well perfused. 2+ radial and PT pulses bilaterally Skin: warm and dry, no rashes noted Neuro: alert and oriented, no focal deficits, CN 2-12 intact, sensation intact bilaterally, no finger to nose dysmetria    Assessment & Plan:    Essential hypertension Uncontrolled.  Unclear why patient was taken off of Spironolactone as per records patient has taken in the past but is no longer on medication list.  Given that patient remains uncontrolled while on 3 medications will begin Spironolactone.  Will start at 25 mg daily.  Patient to follow-up in 1 week for RN blood pressure check.  Patient to follow-up with PCP in 1 month for blood pressure evaluation.  Headaches are also likely secondary to uncontrolled blood pressure.  Patient also with poor diet and increased salt intake due to cannot and processed foods.  Discussed dietary modifications with patient.  Patient was not aware that can feeds had increased salt so she said she will try and read labels to decrease salt intake. -Continue losartan  100 mg daily, Norvasc 10 mg daily, hydrochlorothiazide 25 mg daily -Begin spironolactone 25 mg daily -We will check CMP -Dietary modifications to decrease salt intake -Follow-up in 1 week for RN blood pressure check -Follow-up in 1 month with PCP blood pressure evaluation -Strict return precautions given  Headache Likely secondary to uncontrolled hypertension.  As we are starting Spironolactone  patient's headaches are likely to resolve his blood pressure is not controlled.  Possibly a migraine aspect given that patient has associated nausea, vomiting, photo and phonophobia with vision disturbance.  Given that Fioricet has helped patient in the past will continue Fioricet as needed. -Strict return precautions given -Follow-up as needed  GERD (gastroesophageal reflux disease) Patient with abdominal pain is worse at night and with spicy or acidic foods.  Likely that this is secondary to her reflux.  Given that ranitidine has not been helping patient the patient has success with pantoprazole in the past we will plan on giving pantoprazole prescription to patient.  Have recommended that long-term use of PPIs not be continued given that patient is a female and do not want to lead to osteoporosis.  Patient aware.  Encourage patient to use conservative measures such as diet changes and elevating head of bed. Will obtain CMP and lipase levels to rule out liver disease and pancreatitis as cause of abdominal pain.  -Pantoprazole -Follow-up as needed    Return in about 1 week (around 12/09/2017) for RN BP check.   [name], DO, PGY-2

## 2017-12-02 ENCOUNTER — Other Ambulatory Visit: Payer: Self-pay

## 2017-12-02 ENCOUNTER — Ambulatory Visit (INDEPENDENT_AMBULATORY_CARE_PROVIDER_SITE_OTHER): Payer: Medicare Other | Admitting: Family Medicine

## 2017-12-02 ENCOUNTER — Encounter: Payer: Self-pay | Admitting: Family Medicine

## 2017-12-02 ENCOUNTER — Telehealth: Payer: Self-pay | Admitting: Family Medicine

## 2017-12-02 VITALS — BP 176/90 | HR 64 | Temp 98.8°F | Ht 63.0 in | Wt 202.8 lb

## 2017-12-02 DIAGNOSIS — G44209 Tension-type headache, unspecified, not intractable: Secondary | ICD-10-CM

## 2017-12-02 DIAGNOSIS — R1084 Generalized abdominal pain: Secondary | ICD-10-CM

## 2017-12-02 DIAGNOSIS — E1142 Type 2 diabetes mellitus with diabetic polyneuropathy: Secondary | ICD-10-CM

## 2017-12-02 DIAGNOSIS — I10 Essential (primary) hypertension: Secondary | ICD-10-CM | POA: Diagnosis not present

## 2017-12-02 DIAGNOSIS — K219 Gastro-esophageal reflux disease without esophagitis: Secondary | ICD-10-CM

## 2017-12-02 LAB — POCT GLYCOSYLATED HEMOGLOBIN (HGB A1C): HbA1c, POC (controlled diabetic range): 7.2 % — AB (ref 0.0–7.0)

## 2017-12-02 MED ORDER — SPIRONOLACTONE 25 MG PO TABS: 25.0000 mg | ORAL_TABLET | Freq: Every day | ORAL | 0 refills | Status: DC

## 2017-12-02 MED ORDER — PANTOPRAZOLE SODIUM 20 MG PO TBEC: 20.0000 mg | DELAYED_RELEASE_TABLET | Freq: Every day | ORAL | 2 refills | Status: DC

## 2017-12-02 MED ORDER — BUTALBITAL-APAP-CAFFEINE 50-325-40 MG PO TABS: 1.0000 | ORAL_TABLET | Freq: Three times a day (TID) | ORAL | 0 refills | Status: DC | PRN

## 2017-12-02 NOTE — Telephone Encounter (Signed)
Patient is out of [place] and cannot get until Tuesday, can she get sooner than that, or a sample pen?

## 2017-12-02 NOTE — Patient Instructions (Signed)
It was a pleasure seeing you today.   Today we discussed your headaches, abdominal pain, and hypertension  For your hypertension: Have added on a medication called Spironolactone.  Please take this daily.  I want you to come back in in 1 week for nursing blood pressure check.  I want you to follow-up with me in 1 month.  Please follow-up as significant for diabetes follow-up  Please follow up in 1 week or sooner if symptoms persist or worsen. Please call the clinic immediately if you have any concerns.   Our clinic's number is [phone]. Please call with questions or concerns.   Please go to the emergency room if you have chest pain or shortness of breath or have low blood pressure  Thank you,  [name], DO

## 2017-12-03 ENCOUNTER — Other Ambulatory Visit (INDEPENDENT_AMBULATORY_CARE_PROVIDER_SITE_OTHER): Payer: Self-pay | Admitting: Orthopaedic Surgery

## 2017-12-03 ENCOUNTER — Other Ambulatory Visit: Payer: Self-pay

## 2017-12-03 LAB — LIPASE: Lipase: 22 U/L (ref 14–72)

## 2017-12-03 LAB — COMPREHENSIVE METABOLIC PANEL
ALT: 14 IU/L (ref 0–32)
AST: 14 IU/L (ref 0–40)
Albumin/Globulin Ratio: 1.4 (ref 1.2–2.2)
Albumin: 4.3 g/dL (ref 3.6–4.8)
Alkaline Phosphatase: 68 IU/L (ref 39–117)
BUN/Creatinine Ratio: 10 — ABNORMAL LOW (ref 12–28)
BUN: 8 mg/dL (ref 8–27)
Bilirubin Total: 0.4 mg/dL (ref 0.0–1.2)
CO2: 25 mmol/L (ref 20–29)
Calcium: 9.1 mg/dL (ref 8.7–10.3)
Chloride: 102 mmol/L (ref 96–106)
Creatinine, Ser: 0.82 mg/dL (ref 0.57–1.00)
GFR calc Af Amer: 84 mL/min/{1.73_m2} (ref >59)
GFR calc non Af Amer: 73 mL/min/{1.73_m2} (ref >59)
Globulin, Total: 3 g/dL (ref 1.5–4.5)
Glucose: 183 mg/dL — ABNORMAL HIGH (ref 65–99)
Potassium: 4.2 mmol/L (ref 3.5–5.2)
Sodium: 142 mmol/L (ref 134–144)
Total Protein: 7.3 g/dL (ref 6.0–8.5)

## 2017-12-03 MED ORDER — SPIRONOLACTONE 25 MG PO TABS: 25.0000 mg | ORAL_TABLET | Freq: Every day | ORAL | 0 refills | Status: DC

## 2017-12-03 NOTE — Assessment & Plan Note (Signed)
Likely secondary to uncontrolled hypertension.  As we are starting Spironolactone patient's headaches are likely to resolve his blood pressure is not controlled.  Possibly a migraine aspect given that patient has associated nausea, vomiting, photo and phonophobia with vision disturbance.  Given that Fioricet has helped patient in the past will continue Fioricet as needed. -Strict return precautions given -Follow-up as needed

## 2017-12-03 NOTE — Assessment & Plan Note (Addendum)
Uncontrolled.  Unclear why patient was taken off of Spironolactone as per records patient has taken in the past but is no longer on medication list.  Given that patient remains uncontrolled while on 3 medications will begin Spironolactone.  Will start at 25 mg daily.  Patient to follow-up in 1 week for RN blood pressure check.  Patient to follow-up with PCP in 1 month for blood pressure evaluation.  Headaches are also likely secondary to uncontrolled blood pressure.  Patient also with poor diet and increased salt intake due to cannot and processed foods.  Discussed dietary modifications with patient.  Patient was not aware that can feeds had increased salt so she said she will try and read labels to decrease salt intake. -Continue losartan 100 mg daily, Norvasc 10 mg daily, hydrochlorothiazide 25 mg daily -Begin spironolactone 25 mg daily -We will check CMP -Dietary modifications to decrease salt intake -Follow-up in 1 week for RN blood pressure check -Follow-up in 1 month with PCP blood pressure evaluation -Strict return precautions given

## 2017-12-03 NOTE — Assessment & Plan Note (Addendum)
Patient with abdominal pain is worse at night and with spicy or acidic foods.  Likely that this is secondary to her reflux.  Given that ranitidine has not been helping patient the patient has success with pantoprazole in the past we will plan on giving pantoprazole prescription to patient.  Have recommended that long-term use of PPIs not be continued given that patient is a female and do not want to lead to osteoporosis.  Patient aware.  Encourage patient to use conservative measures such as diet changes and elevating head of bed. Will obtain CMP and lipase levels to rule out liver disease and pancreatitis as cause of abdominal pain.  -Pantoprazole -Follow-up as needed

## 2017-12-04 ENCOUNTER — Telehealth: Payer: Self-pay | Admitting: *Deleted

## 2017-12-04 NOTE — Telephone Encounter (Signed)
Patient reported she was running out of insulin today (had enough for 1 more dose).  She reports taking 40 units twice daily of Lantus.  Denies symptoms of low blood sugar.  States control has been very good lately.   Patient was willing to switch insulin for the next 4 days using [place] in place of Lantus.   Advised to take 35 units twice daily and reduce to 30 units if she noticed low readings< 100.     Medication Samples have been provided to the patient.  Drug name: Tyler Aas (insulin deglutec)  Dose: 35 units twice daily        Qty: 1 pen  LOT: [id number]  Exp.Date: 09/19/2019 The patient has been instructed regarding the correct time, dose, and frequency of taking this medication, including desired effects and most common side effects.   [name] [name] 2:44 PM 12/04/2017  Advised to return to Lantus in 4 days when supply is available to her.   New Lantus prescription sent to her pharmacy.   Next visit in Rx clinic in 3 weeks - asked patient to call and schedule with our office.

## 2017-12-04 NOTE — Telephone Encounter (Signed)
Pt informed. Deseree Blount, CMA  

## 2017-12-09 ENCOUNTER — Other Ambulatory Visit: Payer: Self-pay | Admitting: Family Medicine

## 2017-12-09 ENCOUNTER — Ambulatory Visit: Payer: Medicare Other

## 2017-12-09 DIAGNOSIS — E1142 Type 2 diabetes mellitus with diabetic polyneuropathy: Secondary | ICD-10-CM

## 2017-12-11 ENCOUNTER — Ambulatory Visit: Payer: Medicare Other

## 2017-12-14 ENCOUNTER — Other Ambulatory Visit: Payer: Self-pay | Admitting: Internal Medicine

## 2017-12-16 ENCOUNTER — Ambulatory Visit: Payer: Medicare Other

## 2017-12-16 VITALS — BP 132/80

## 2017-12-16 DIAGNOSIS — I10 Essential (primary) hypertension: Secondary | ICD-10-CM

## 2017-12-16 NOTE — Progress Notes (Signed)
Patient here today for BP check.    BP today is 132/80.  Checked BP in L arm with large cuff.  Symptoms present: none.  Patient last took BP med 0800 today.  Routed note to PCP.    Wallace Cullens, RN

## 2017-12-26 ENCOUNTER — Telehealth: Payer: Self-pay | Admitting: Internal Medicine

## 2017-12-26 DIAGNOSIS — E1142 Type 2 diabetes mellitus with diabetic polyneuropathy: Secondary | ICD-10-CM

## 2017-12-26 MED ORDER — INSULIN GLARGINE 100 UNIT/ML SOLOSTAR PEN: PEN_INJECTOR | SUBCUTANEOUS | 0 refills | Status: DC

## 2017-12-26 NOTE — Telephone Encounter (Signed)
   Reason for call:   I received a call from Ms. Terea Coletta at 10:20 AM indicating that she is out of lantus insulin.   Pertinent Data:   Patient takes 30 units twice daily of the lantus   Lowest blood sugar in past month was 172, she had symptoms of feeling like blood sugar was low at this time   Patient was provided a sample of insulin degludec from the clinic 8/14.Review of the chart does show that a refill request was sent 12/14/2017 by Dr. Tammi Klippel. When she went to the pharmacy today to get insulin she realized she did not have refills available.    Assessment / Plan / Recommendations:   Prescription for Lantus 40 units BID was electronically sent to [facility] on [address]   As always, pt is advised that if symptoms worsen or new symptoms arise, they should go to an urgent care facility or to to ER for further evaluation.   Ledell Noss, MD   12/26/2017, 10:23 AM

## 2018-01-05 ENCOUNTER — Telehealth: Payer: Self-pay | Admitting: Family Medicine

## 2018-01-05 NOTE — Telephone Encounter (Signed)
Prescription written 05/07/17 for Pregabalin shredded as not picked up yet.

## 2018-01-11 ENCOUNTER — Other Ambulatory Visit: Payer: Self-pay | Admitting: Family Medicine

## 2018-03-01 IMAGING — MR MR LUMBAR SPINE W/O CM
4 of 5 series · 19 of 48 positions shown · non-contrast
Comparison: None.

CLINICAL DATA: Low back pain for 4 years. No pain radiating into
the legs.

EXAM:
MRI LUMBAR SPINE WITHOUT CONTRAST
TECHNIQUE: Multiplanar, multisequence MR imaging of the lumbar spine was
performed. No intravenous contrast was administered.

[Series 6: T2 · sagittal · 4.0mm · 0.73mm/px · 7 of 15 slices shown (1 of 2)]
[im 1/15]
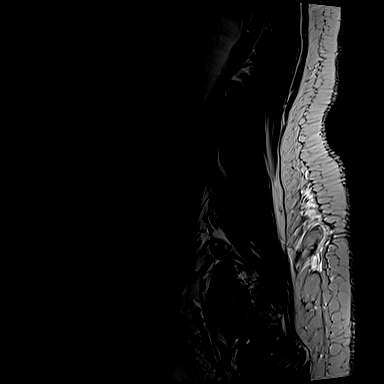
[im 3/15]
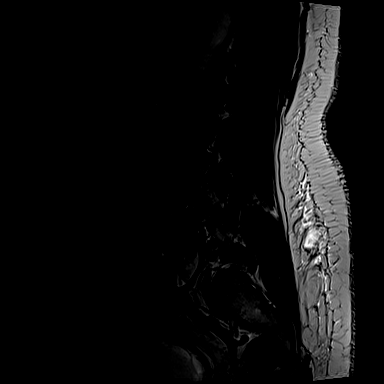
[im 5/15]
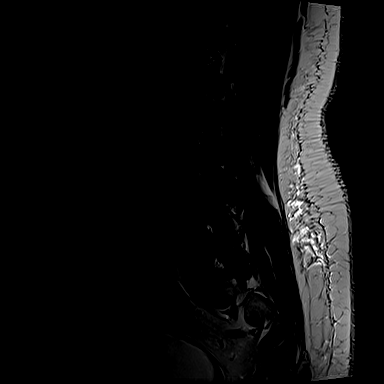
[im 8/15]
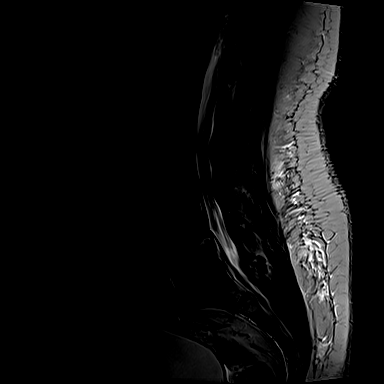
[im 10/15]
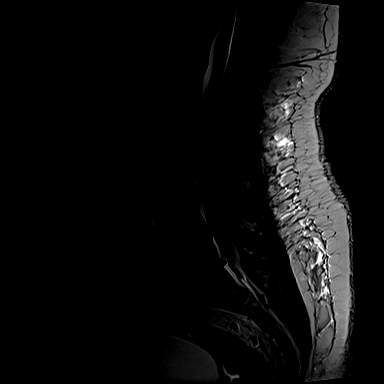
[im 12/15]
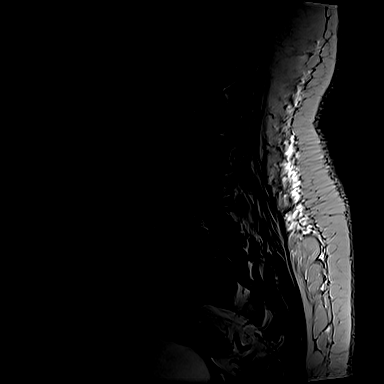
[im 15/15]
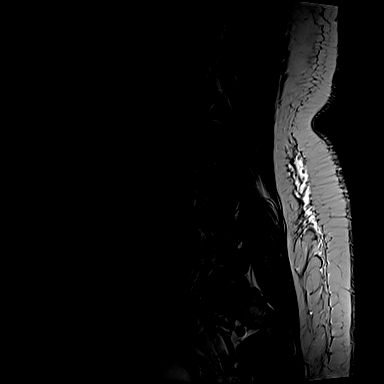

[Series 7: T1 · sagittal · 4.0mm · 0.73mm/px · 3 of 15 slices shown (1 of 2)]
[im 3/15]
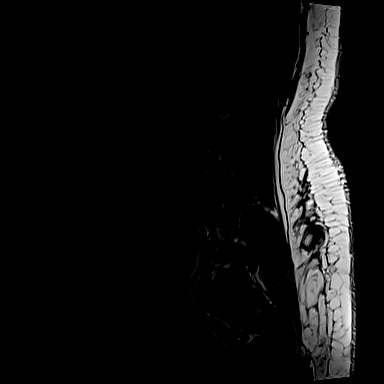
[im 8/15]
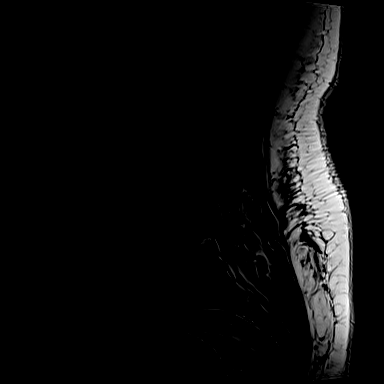
[im 12/15]
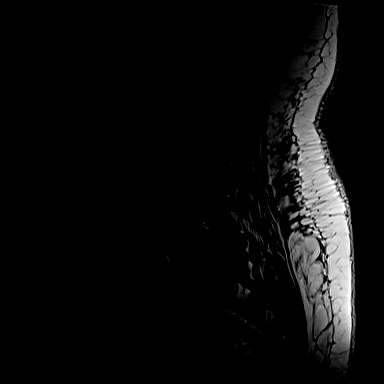

[Series 11: T1 · axial · 4.0mm · 0.28mm/px · z∈[-77,+40]mm · 3 of 34 slices shown (2 of 2)]
[im 6/34]
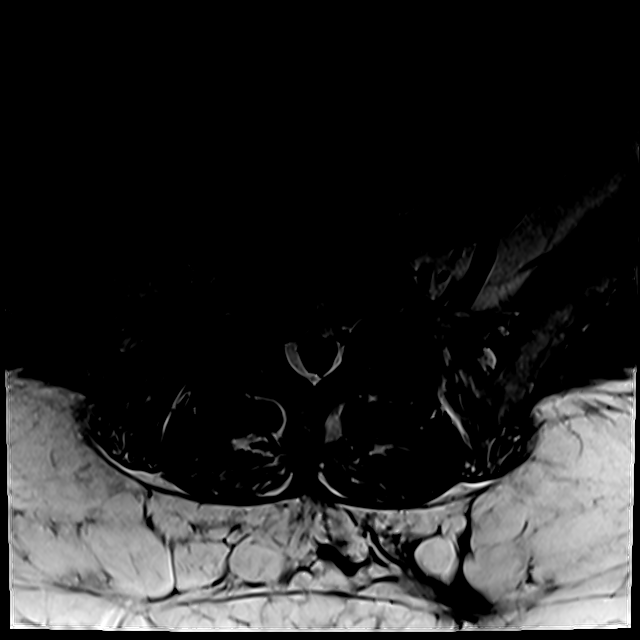
[im 18/34]
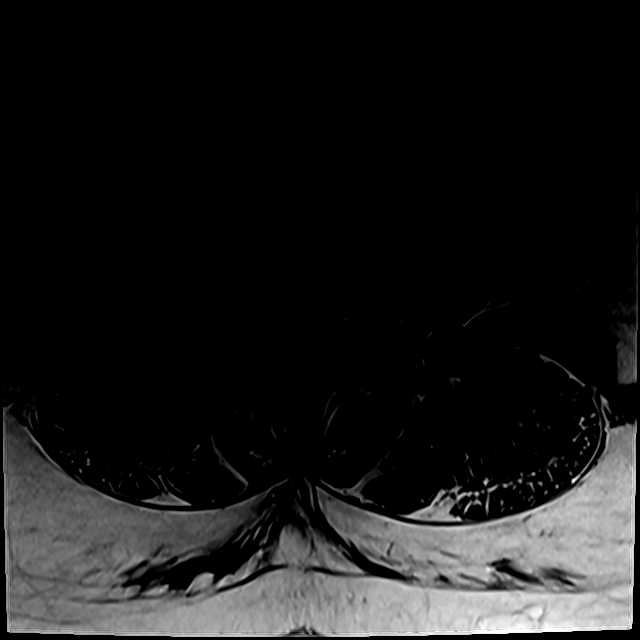
[im 28/34]
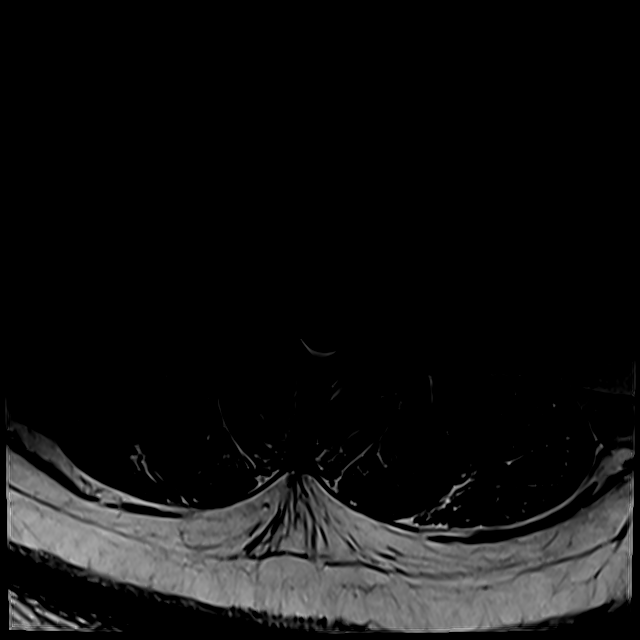

[Series 14: T2 · axial · 4.0mm · 0.28mm/px · z∈[-101,+40]mm · 6 of 34 slices shown (2 of 2)]
[im 1/34]
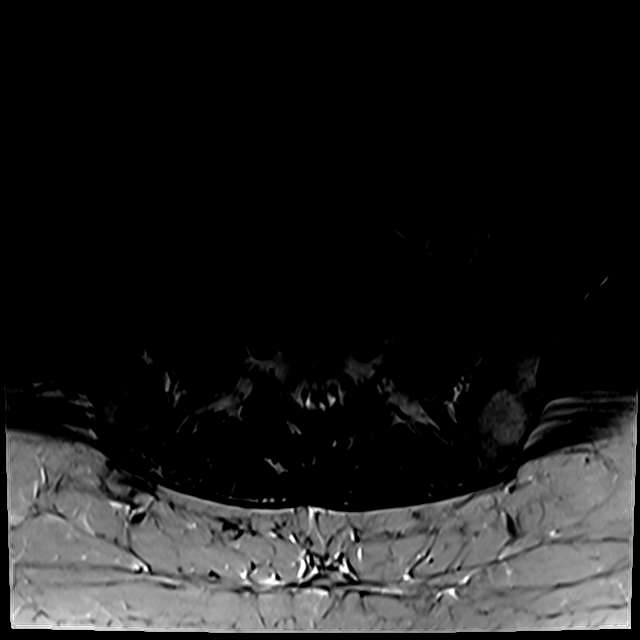
[im 6/34]
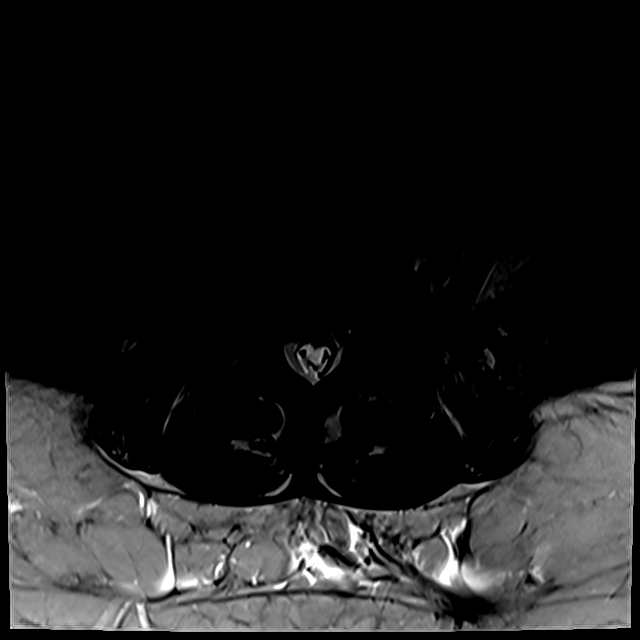
[im 11/34]
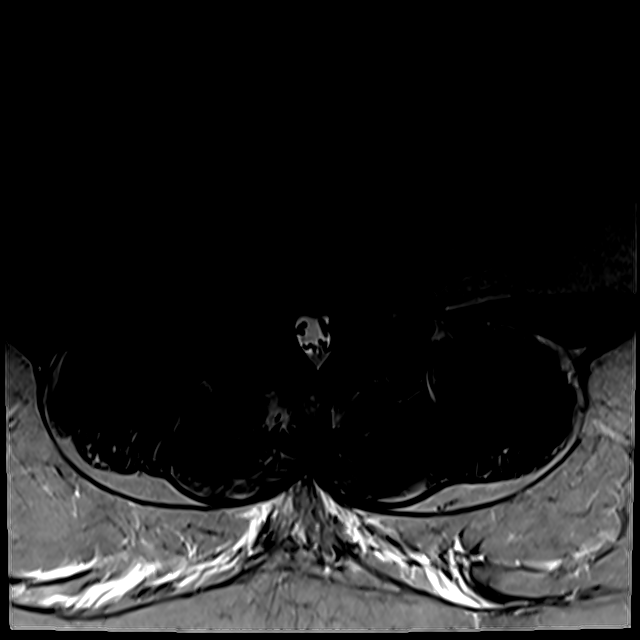
[im 16/34]
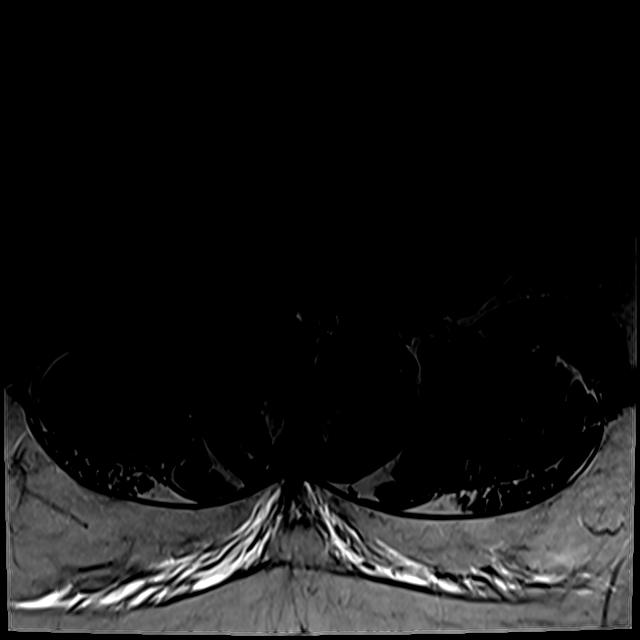
[im 18/34]
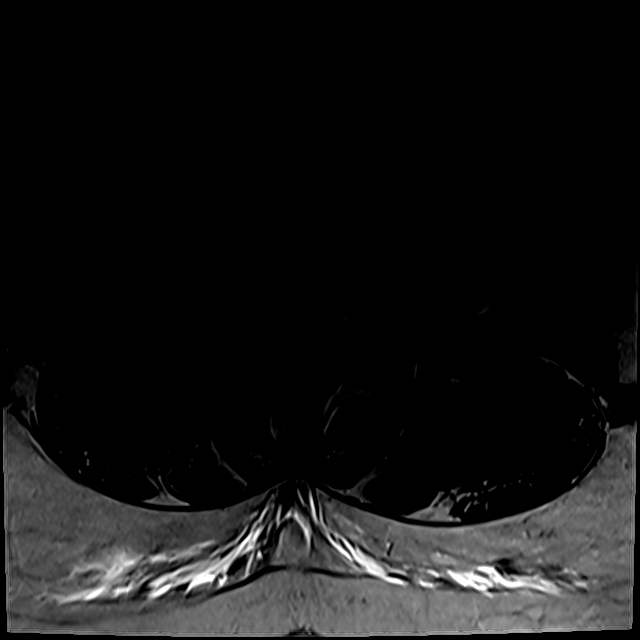
[im 28/34]
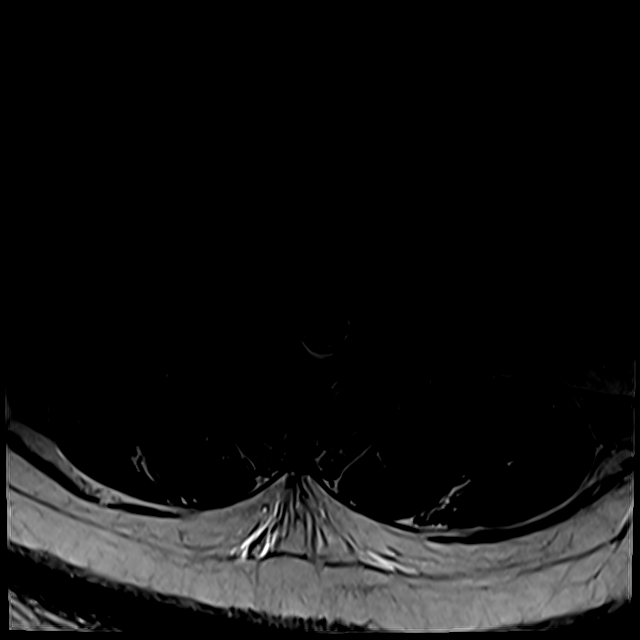

[19 of 48 positions shown; findings below may reference images not displayed]

FINDINGS: Segmentation:  Standard.

Alignment:  Physiologic.

Vertebrae:  No fracture, evidence of discitis, or bone lesion.

Conus medullaris: Extends to the T12 level and appears normal.

Paraspinal and other soft tissues: Negative.

Disc levels:

Disc spaces: Disc heights are maintained. Disc desiccation at L2-3,
L3-4, L4-5 and L5-S1.

T12-L1: No significant disc bulge. No evidence of neural foraminal
stenosis. No central canal stenosis.

L1-L2: No significant disc bulge. Mild bilateral facet arthropathy.
No central canal stenosis.

L2-L3: Mild broad-based disc bulge. Moderate right and mild left
facet arthropathy with right lateral recess stenosis. Mild spinal
stenosis. Mild bilateral foraminal narrowing.

L3-L4: No significant disc bulge. Moderate bilateral facet
arthropathy with lateral recess stenosis. Moderate spinal stenosis.
Moderate right foraminal stenosis. No left foraminal stenosis.

L4-L5: Moderate bilateral facet arthropathy. Severe left lateral
recess stenosis. Mild bilateral foraminal stenosis. No central canal
stenosis.

L5-S1: Small right paracentral disc protrusion. Moderate bilateral
facet arthropathy. No foraminal stenosis. No evidence of neural
foraminal stenosis. No central canal stenosis.
IMPRESSION: 1. At L2-3 there is a mild broad-based disc bulge. Moderate right
and mild left facet arthropathy with right lateral recess stenosis.
Mild spinal stenosis. Mild bilateral foraminal narrowing.
2. At L3-4 there is moderate bilateral facet arthropathy with
lateral recess stenosis. Moderate spinal stenosis. Moderate right
foraminal stenosis.
3. At L4-5 there is moderate bilateral facet arthropathy. Severe
left lateral recess stenosis. Mild bilateral foraminal stenosis.
4. At L5-S1 there is a small right paracentral disc protrusion.
Moderate bilateral facet arthropathy.

## 2018-03-08 ENCOUNTER — Emergency Department (HOSPITAL_COMMUNITY): Payer: Medicare Other

## 2018-03-08 ENCOUNTER — Encounter (HOSPITAL_COMMUNITY): Payer: Self-pay

## 2018-03-08 ENCOUNTER — Inpatient Hospital Stay (HOSPITAL_COMMUNITY)
Admission: EM | Admit: 2018-03-08 | Discharge: 2018-03-15 | DRG: 871 | Disposition: A | Discharge status: Home Health | Payer: Medicare Other | Attending: Family Medicine | Admitting: Family Medicine

## 2018-03-08 DIAGNOSIS — A419 Sepsis, unspecified organism: Principal | ICD-10-CM | POA: Diagnosis present

## 2018-03-08 DIAGNOSIS — Z794 Long term (current) use of insulin: Secondary | ICD-10-CM

## 2018-03-08 DIAGNOSIS — F323 Major depressive disorder, single episode, severe with psychotic features: Secondary | ICD-10-CM | POA: Diagnosis present

## 2018-03-08 DIAGNOSIS — H9190 Unspecified hearing loss, unspecified ear: Secondary | ICD-10-CM | POA: Diagnosis present

## 2018-03-08 DIAGNOSIS — Z91040 Latex allergy status: Secondary | ICD-10-CM

## 2018-03-08 DIAGNOSIS — B370 Candidal stomatitis: Secondary | ICD-10-CM | POA: Diagnosis present

## 2018-03-08 DIAGNOSIS — E1142 Type 2 diabetes mellitus with diabetic polyneuropathy: Secondary | ICD-10-CM | POA: Diagnosis present

## 2018-03-08 DIAGNOSIS — N390 Urinary tract infection, site not specified: Secondary | ICD-10-CM | POA: Diagnosis present

## 2018-03-08 DIAGNOSIS — M1990 Unspecified osteoarthritis, unspecified site: Secondary | ICD-10-CM | POA: Diagnosis present

## 2018-03-08 DIAGNOSIS — K612 Anorectal abscess: Secondary | ICD-10-CM | POA: Diagnosis present

## 2018-03-08 DIAGNOSIS — Z885 Allergy status to narcotic agent status: Secondary | ICD-10-CM

## 2018-03-08 DIAGNOSIS — E669 Obesity, unspecified: Secondary | ICD-10-CM | POA: Diagnosis present

## 2018-03-08 DIAGNOSIS — Z7982 Long term (current) use of aspirin: Secondary | ICD-10-CM

## 2018-03-08 DIAGNOSIS — J9811 Atelectasis: Secondary | ICD-10-CM | POA: Diagnosis present

## 2018-03-08 DIAGNOSIS — F419 Anxiety disorder, unspecified: Secondary | ICD-10-CM | POA: Diagnosis present

## 2018-03-08 DIAGNOSIS — G9341 Metabolic encephalopathy: Secondary | ICD-10-CM | POA: Diagnosis present

## 2018-03-08 DIAGNOSIS — E860 Dehydration: Secondary | ICD-10-CM | POA: Diagnosis present

## 2018-03-08 DIAGNOSIS — R9431 Abnormal electrocardiogram [ECG] [EKG]: Secondary | ICD-10-CM

## 2018-03-08 DIAGNOSIS — R51 Headache: Secondary | ICD-10-CM | POA: Diagnosis present

## 2018-03-08 DIAGNOSIS — K611 Rectal abscess: Secondary | ICD-10-CM | POA: Diagnosis present

## 2018-03-08 DIAGNOSIS — G894 Chronic pain syndrome: Secondary | ICD-10-CM | POA: Diagnosis present

## 2018-03-08 DIAGNOSIS — M549 Dorsalgia, unspecified: Secondary | ICD-10-CM | POA: Diagnosis present

## 2018-03-08 DIAGNOSIS — E872 Acidosis: Secondary | ICD-10-CM | POA: Diagnosis present

## 2018-03-08 DIAGNOSIS — Z539 Procedure and treatment not carried out, unspecified reason: Secondary | ICD-10-CM | POA: Diagnosis not present

## 2018-03-08 DIAGNOSIS — Z8673 Personal history of transient ischemic attack (TIA), and cerebral infarction without residual deficits: Secondary | ICD-10-CM

## 2018-03-08 DIAGNOSIS — J45909 Unspecified asthma, uncomplicated: Secondary | ICD-10-CM | POA: Diagnosis present

## 2018-03-08 DIAGNOSIS — R7989 Other specified abnormal findings of blood chemistry: Secondary | ICD-10-CM

## 2018-03-08 DIAGNOSIS — Z888 Allergy status to other drugs, medicaments and biological substances status: Secondary | ICD-10-CM

## 2018-03-08 DIAGNOSIS — R402363 Coma scale, best motor response, obeys commands, at hospital admission: Secondary | ICD-10-CM | POA: Diagnosis present

## 2018-03-08 DIAGNOSIS — K3184 Gastroparesis: Secondary | ICD-10-CM | POA: Diagnosis present

## 2018-03-08 DIAGNOSIS — E1143 Type 2 diabetes mellitus with diabetic autonomic (poly)neuropathy: Secondary | ICD-10-CM | POA: Diagnosis present

## 2018-03-08 DIAGNOSIS — K610 Anal abscess: Secondary | ICD-10-CM | POA: Diagnosis not present

## 2018-03-08 DIAGNOSIS — Z79899 Other long term (current) drug therapy: Secondary | ICD-10-CM

## 2018-03-08 DIAGNOSIS — I491 Atrial premature depolarization: Secondary | ICD-10-CM | POA: Diagnosis present

## 2018-03-08 DIAGNOSIS — E876 Hypokalemia: Secondary | ICD-10-CM | POA: Diagnosis present

## 2018-03-08 DIAGNOSIS — N179 Acute kidney failure, unspecified: Secondary | ICD-10-CM

## 2018-03-08 DIAGNOSIS — I110 Hypertensive heart disease with heart failure: Secondary | ICD-10-CM | POA: Diagnosis present

## 2018-03-08 DIAGNOSIS — R5082 Postprocedural fever: Secondary | ICD-10-CM

## 2018-03-08 DIAGNOSIS — I248 Other forms of acute ischemic heart disease: Secondary | ICD-10-CM | POA: Diagnosis present

## 2018-03-08 DIAGNOSIS — K5900 Constipation, unspecified: Secondary | ICD-10-CM | POA: Diagnosis present

## 2018-03-08 DIAGNOSIS — Z8719 Personal history of other diseases of the digestive system: Secondary | ICD-10-CM

## 2018-03-08 DIAGNOSIS — F17210 Nicotine dependence, cigarettes, uncomplicated: Secondary | ICD-10-CM | POA: Diagnosis present

## 2018-03-08 DIAGNOSIS — R079 Chest pain, unspecified: Secondary | ICD-10-CM

## 2018-03-08 DIAGNOSIS — R0902 Hypoxemia: Secondary | ICD-10-CM | POA: Diagnosis present

## 2018-03-08 DIAGNOSIS — R402253 Coma scale, best verbal response, oriented, at hospital admission: Secondary | ICD-10-CM | POA: Diagnosis present

## 2018-03-08 DIAGNOSIS — M797 Fibromyalgia: Secondary | ICD-10-CM | POA: Diagnosis present

## 2018-03-08 DIAGNOSIS — I25118 Atherosclerotic heart disease of native coronary artery with other forms of angina pectoris: Secondary | ICD-10-CM | POA: Diagnosis present

## 2018-03-08 DIAGNOSIS — D649 Anemia, unspecified: Secondary | ICD-10-CM | POA: Diagnosis present

## 2018-03-08 DIAGNOSIS — Z9049 Acquired absence of other specified parts of digestive tract: Secondary | ICD-10-CM

## 2018-03-08 DIAGNOSIS — K439 Ventral hernia without obstruction or gangrene: Secondary | ICD-10-CM | POA: Diagnosis present

## 2018-03-08 DIAGNOSIS — R402143 Coma scale, eyes open, spontaneous, at hospital admission: Secondary | ICD-10-CM | POA: Diagnosis present

## 2018-03-08 DIAGNOSIS — R6520 Severe sepsis without septic shock: Secondary | ICD-10-CM | POA: Diagnosis present

## 2018-03-08 DIAGNOSIS — E785 Hyperlipidemia, unspecified: Secondary | ICD-10-CM | POA: Diagnosis present

## 2018-03-08 DIAGNOSIS — Z6838 Body mass index (BMI) 38.0-38.9, adult: Secondary | ICD-10-CM

## 2018-03-08 DIAGNOSIS — F411 Generalized anxiety disorder: Secondary | ICD-10-CM | POA: Diagnosis present

## 2018-03-08 DIAGNOSIS — K219 Gastro-esophageal reflux disease without esophagitis: Secondary | ICD-10-CM | POA: Diagnosis present

## 2018-03-08 DIAGNOSIS — I5030 Unspecified diastolic (congestive) heart failure: Secondary | ICD-10-CM | POA: Diagnosis present

## 2018-03-08 DIAGNOSIS — Z881 Allergy status to other antibiotic agents status: Secondary | ICD-10-CM

## 2018-03-08 DIAGNOSIS — K6289 Other specified diseases of anus and rectum: Secondary | ICD-10-CM | POA: Diagnosis present

## 2018-03-08 LAB — BASIC METABOLIC PANEL
Anion gap: 12 (ref 5–15)
BUN: 13 mg/dL (ref 8–23)
CO2: 22 mmol/L (ref 22–32)
Calcium: 8.4 mg/dL — ABNORMAL LOW (ref 8.9–10.3)
Chloride: 101 mmol/L (ref 98–111)
Creatinine, Ser: 1.28 mg/dL — ABNORMAL HIGH (ref 0.44–1.00)
GFR calc Af Amer: 48 mL/min — ABNORMAL LOW (ref >60)
GFR calc non Af Amer: 42 mL/min — ABNORMAL LOW (ref >60)
Glucose, Bld: 232 mg/dL — ABNORMAL HIGH (ref 70–99)
Potassium: 3.2 mmol/L — ABNORMAL LOW (ref 3.5–5.1)
Sodium: 135 mmol/L (ref 135–145)

## 2018-03-08 LAB — CBC
HCT: 36.5 % (ref 36.0–46.0)
Hemoglobin: 10.9 g/dL — ABNORMAL LOW (ref 12.0–15.0)
MCH: 27 pg (ref 26.0–34.0)
MCHC: 29.9 g/dL — ABNORMAL LOW (ref 30.0–36.0)
MCV: 90.6 fL (ref 80.0–100.0)
Platelets: 183 10*3/uL (ref 150–400)
RBC: 4.03 MIL/uL (ref 3.87–5.11)
RDW: 13.4 % (ref 11.5–15.5)
WBC: 17.7 10*3/uL — ABNORMAL HIGH (ref 4.0–10.5)
nRBC: 0 % (ref 0.0–0.2)

## 2018-03-08 LAB — CBG MONITORING, ED: Glucose-Capillary: 217 mg/dL — ABNORMAL HIGH (ref 70–99)

## 2018-03-08 LAB — I-STAT CG4 LACTIC ACID, ED: Lactic Acid, Venous: 3.42 mmol/L (ref 0.5–1.9)

## 2018-03-08 MED ORDER — SODIUM CHLORIDE 0.9 % IV BOLUS
1000.0000 mL | Freq: Once | INTRAVENOUS | Status: AC
  Administered 2018-03-08: 1000 mL via INTRAVENOUS

## 2018-03-08 MED ORDER — ACETAMINOPHEN 325 MG PO TABS
650.0000 mg | ORAL_TABLET | Freq: Once | ORAL | Status: AC
  Administered 2018-03-08: 650 mg via ORAL
  Filled 2018-03-08: qty 2

## 2018-03-08 MED ORDER — SODIUM CHLORIDE 0.9 % IV SOLN
1.0000 g | Freq: Once | INTRAVENOUS | Status: AC
  Administered 2018-03-08: 1 g via INTRAVENOUS
  Filled 2018-03-08: qty 10

## 2018-03-08 MED ORDER — IOPAMIDOL (ISOVUE-300) INJECTION 61%
100.0000 mL | Freq: Once | INTRAVENOUS | Status: AC | PRN
  Administered 2018-03-08: 100 mL via INTRAVENOUS

## 2018-03-08 NOTE — ED Triage Notes (Signed)
Pt states that she has had generalized weakness with nausea, dry heavy and diarrhea for the past three days. Pt reports having an abscess on her buttock that has been there for several days, no drainage.

## 2018-03-08 NOTE — ED Notes (Signed)
Patient transported to CT 
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
## 2018-03-09 ENCOUNTER — Encounter (HOSPITAL_COMMUNITY)
Admission: EM | Disposition: A | Discharge status: Home Health | Payer: Self-pay | Source: Home / Self Care | Attending: Family Medicine

## 2018-03-09 ENCOUNTER — Inpatient Hospital Stay (HOSPITAL_COMMUNITY): Payer: Medicare Other | Admitting: Certified Registered Nurse Anesthetist

## 2018-03-09 ENCOUNTER — Other Ambulatory Visit: Payer: Self-pay

## 2018-03-09 ENCOUNTER — Encounter (HOSPITAL_COMMUNITY): Payer: Self-pay | Admitting: Orthopedic Surgery

## 2018-03-09 DIAGNOSIS — R509 Fever, unspecified: Secondary | ICD-10-CM | POA: Diagnosis not present

## 2018-03-09 DIAGNOSIS — M797 Fibromyalgia: Secondary | ICD-10-CM | POA: Diagnosis present

## 2018-03-09 DIAGNOSIS — R6520 Severe sepsis without septic shock: Secondary | ICD-10-CM | POA: Diagnosis present

## 2018-03-09 DIAGNOSIS — G9340 Encephalopathy, unspecified: Secondary | ICD-10-CM | POA: Diagnosis not present

## 2018-03-09 DIAGNOSIS — E119 Type 2 diabetes mellitus without complications: Secondary | ICD-10-CM | POA: Diagnosis not present

## 2018-03-09 DIAGNOSIS — E785 Hyperlipidemia, unspecified: Secondary | ICD-10-CM | POA: Diagnosis present

## 2018-03-09 DIAGNOSIS — N179 Acute kidney failure, unspecified: Secondary | ICD-10-CM | POA: Diagnosis present

## 2018-03-09 DIAGNOSIS — E1142 Type 2 diabetes mellitus with diabetic polyneuropathy: Secondary | ICD-10-CM | POA: Diagnosis present

## 2018-03-09 DIAGNOSIS — F411 Generalized anxiety disorder: Secondary | ICD-10-CM | POA: Diagnosis present

## 2018-03-09 DIAGNOSIS — E876 Hypokalemia: Secondary | ICD-10-CM | POA: Diagnosis present

## 2018-03-09 DIAGNOSIS — R079 Chest pain, unspecified: Secondary | ICD-10-CM | POA: Diagnosis not present

## 2018-03-09 DIAGNOSIS — Z720 Tobacco use: Secondary | ICD-10-CM | POA: Diagnosis not present

## 2018-03-09 DIAGNOSIS — K610 Anal abscess: Secondary | ICD-10-CM

## 2018-03-09 DIAGNOSIS — I5030 Unspecified diastolic (congestive) heart failure: Secondary | ICD-10-CM | POA: Diagnosis present

## 2018-03-09 DIAGNOSIS — E1169 Type 2 diabetes mellitus with other specified complication: Secondary | ICD-10-CM | POA: Diagnosis not present

## 2018-03-09 DIAGNOSIS — Z881 Allergy status to other antibiotic agents status: Secondary | ICD-10-CM | POA: Diagnosis not present

## 2018-03-09 DIAGNOSIS — R7989 Other specified abnormal findings of blood chemistry: Secondary | ICD-10-CM | POA: Diagnosis not present

## 2018-03-09 DIAGNOSIS — R9431 Abnormal electrocardiogram [ECG] [EKG]: Secondary | ICD-10-CM | POA: Diagnosis not present

## 2018-03-09 DIAGNOSIS — D72829 Elevated white blood cell count, unspecified: Secondary | ICD-10-CM | POA: Diagnosis not present

## 2018-03-09 DIAGNOSIS — E872 Acidosis: Secondary | ICD-10-CM | POA: Diagnosis present

## 2018-03-09 DIAGNOSIS — J9811 Atelectasis: Secondary | ICD-10-CM | POA: Diagnosis present

## 2018-03-09 DIAGNOSIS — K611 Rectal abscess: Secondary | ICD-10-CM | POA: Diagnosis present

## 2018-03-09 DIAGNOSIS — F323 Major depressive disorder, single episode, severe with psychotic features: Secondary | ICD-10-CM | POA: Diagnosis present

## 2018-03-09 DIAGNOSIS — Z01810 Encounter for preprocedural cardiovascular examination: Secondary | ICD-10-CM | POA: Diagnosis not present

## 2018-03-09 DIAGNOSIS — A419 Sepsis, unspecified organism: Secondary | ICD-10-CM | POA: Diagnosis present

## 2018-03-09 DIAGNOSIS — Z91040 Latex allergy status: Secondary | ICD-10-CM | POA: Diagnosis not present

## 2018-03-09 DIAGNOSIS — I110 Hypertensive heart disease with heart failure: Secondary | ICD-10-CM | POA: Diagnosis present

## 2018-03-09 DIAGNOSIS — G9341 Metabolic encephalopathy: Secondary | ICD-10-CM | POA: Diagnosis present

## 2018-03-09 DIAGNOSIS — R0789 Other chest pain: Secondary | ICD-10-CM | POA: Diagnosis not present

## 2018-03-09 DIAGNOSIS — Z888 Allergy status to other drugs, medicaments and biological substances status: Secondary | ICD-10-CM | POA: Diagnosis not present

## 2018-03-09 DIAGNOSIS — K612 Anorectal abscess: Secondary | ICD-10-CM | POA: Diagnosis present

## 2018-03-09 DIAGNOSIS — I10 Essential (primary) hypertension: Secondary | ICD-10-CM | POA: Diagnosis not present

## 2018-03-09 DIAGNOSIS — N390 Urinary tract infection, site not specified: Secondary | ICD-10-CM | POA: Diagnosis present

## 2018-03-09 DIAGNOSIS — K3184 Gastroparesis: Secondary | ICD-10-CM | POA: Diagnosis present

## 2018-03-09 DIAGNOSIS — B370 Candidal stomatitis: Secondary | ICD-10-CM | POA: Diagnosis present

## 2018-03-09 DIAGNOSIS — Z885 Allergy status to narcotic agent status: Secondary | ICD-10-CM | POA: Diagnosis not present

## 2018-03-09 DIAGNOSIS — I248 Other forms of acute ischemic heart disease: Secondary | ICD-10-CM | POA: Diagnosis present

## 2018-03-09 DIAGNOSIS — E669 Obesity, unspecified: Secondary | ICD-10-CM | POA: Diagnosis present

## 2018-03-09 DIAGNOSIS — Z794 Long term (current) use of insulin: Secondary | ICD-10-CM | POA: Diagnosis not present

## 2018-03-09 DIAGNOSIS — D649 Anemia, unspecified: Secondary | ICD-10-CM | POA: Diagnosis present

## 2018-03-09 DIAGNOSIS — Z978 Presence of other specified devices: Secondary | ICD-10-CM | POA: Diagnosis not present

## 2018-03-09 DIAGNOSIS — E1143 Type 2 diabetes mellitus with diabetic autonomic (poly)neuropathy: Secondary | ICD-10-CM | POA: Diagnosis present

## 2018-03-09 DIAGNOSIS — J9601 Acute respiratory failure with hypoxia: Secondary | ICD-10-CM | POA: Diagnosis not present

## 2018-03-09 LAB — CBC WITH DIFFERENTIAL/PLATELET
Abs Immature Granulocytes: 0.18 10*3/uL — ABNORMAL HIGH (ref 0.00–0.07)
Basophils Absolute: 0 10*3/uL (ref 0.0–0.1)
Basophils Relative: 0 %
Eosinophils Absolute: 0 10*3/uL (ref 0.0–0.5)
Eosinophils Relative: 0 %
HCT: 33.9 % — ABNORMAL LOW (ref 36.0–46.0)
Hemoglobin: 10.3 g/dL — ABNORMAL LOW (ref 12.0–15.0)
Immature Granulocytes: 1 %
Lymphocytes Relative: 10 %
Lymphs Abs: 1.5 10*3/uL (ref 0.7–4.0)
MCH: 27.5 pg (ref 26.0–34.0)
MCHC: 30.4 g/dL (ref 30.0–36.0)
MCV: 90.4 fL (ref 80.0–100.0)
Monocytes Absolute: 1 10*3/uL (ref 0.1–1.0)
Monocytes Relative: 7 %
Neutro Abs: 11.9 10*3/uL — ABNORMAL HIGH (ref 1.7–7.7)
Neutrophils Relative %: 82 %
Platelets: 148 10*3/uL — ABNORMAL LOW (ref 150–400)
RBC: 3.75 MIL/uL — ABNORMAL LOW (ref 3.87–5.11)
RDW: 13.6 % (ref 11.5–15.5)
WBC: 14.7 10*3/uL — ABNORMAL HIGH (ref 4.0–10.5)
nRBC: 0 % (ref 0.0–0.2)

## 2018-03-09 LAB — COMPREHENSIVE METABOLIC PANEL
ALT: 10 U/L (ref 0–44)
AST: 16 U/L (ref 15–41)
Albumin: 2.9 g/dL — ABNORMAL LOW (ref 3.5–5.0)
Alkaline Phosphatase: 56 U/L (ref 38–126)
Anion gap: 8 (ref 5–15)
BUN: 13 mg/dL (ref 8–23)
CO2: 24 mmol/L (ref 22–32)
Calcium: 7.7 mg/dL — ABNORMAL LOW (ref 8.9–10.3)
Chloride: 104 mmol/L (ref 98–111)
Creatinine, Ser: 1.09 mg/dL — ABNORMAL HIGH (ref 0.44–1.00)
GFR calc Af Amer: 59 mL/min — ABNORMAL LOW (ref >60)
GFR calc non Af Amer: 51 mL/min — ABNORMAL LOW (ref >60)
Glucose, Bld: 182 mg/dL — ABNORMAL HIGH (ref 70–99)
Potassium: 3.2 mmol/L — ABNORMAL LOW (ref 3.5–5.1)
Sodium: 136 mmol/L (ref 135–145)
Total Bilirubin: 0.7 mg/dL (ref 0.3–1.2)
Total Protein: 6.5 g/dL (ref 6.5–8.1)

## 2018-03-09 LAB — GLUCOSE, CAPILLARY
Glucose-Capillary: 188 mg/dL — ABNORMAL HIGH (ref 70–99)
Glucose-Capillary: 164 mg/dL — ABNORMAL HIGH (ref 70–99)
Glucose-Capillary: 146 mg/dL — ABNORMAL HIGH (ref 70–99)
Glucose-Capillary: 138 mg/dL — ABNORMAL HIGH (ref 70–99)
Glucose-Capillary: 146 mg/dL — ABNORMAL HIGH (ref 70–99)

## 2018-03-09 LAB — HIV ANTIBODY (ROUTINE TESTING W REFLEX): HIV Screen 4th Generation wRfx: NONREACTIVE

## 2018-03-09 LAB — TROPONIN I
Troponin I: 0.19 ng/mL (ref <0.03)
Troponin I: 0.18 ng/mL (ref <0.03)

## 2018-03-09 LAB — SURGICAL PCR SCREEN
MRSA, PCR: NEGATIVE
Staphylococcus aureus: NEGATIVE

## 2018-03-09 LAB — I-STAT CG4 LACTIC ACID, ED: Lactic Acid, Venous: 0.92 mmol/L (ref 0.5–1.9)

## 2018-03-09 LAB — PROTIME-INR
INR: 1.19
Prothrombin Time: 15 seconds (ref 11.4–15.2)

## 2018-03-09 SURGERY — INCISION AND DRAINAGE, ABSCESS, PERIRECTAL
Anesthesia: General

## 2018-03-09 SURGERY — CANCELLED PROCEDURE
Anesthesia: General

## 2018-03-09 MED ORDER — POTASSIUM CHLORIDE 10 MEQ/100ML IV SOLN
10.0000 meq | INTRAVENOUS | Status: AC
  Administered 2018-03-09 – 2018-03-10 (×3): 10 meq via INTRAVENOUS
  Filled 2018-03-09 (×3): qty 100

## 2018-03-09 MED ORDER — INSULIN GLARGINE 100 UNIT/ML ~~LOC~~ SOLN
20.0000 [IU] | Freq: Two times a day (BID) | SUBCUTANEOUS | Status: DC
  Administered 2018-03-09 – 2018-03-12 (×5): 20 [IU] via SUBCUTANEOUS
  Filled 2018-03-09 (×9): qty 0.2

## 2018-03-09 MED ORDER — ONDANSETRON HCL 4 MG/2ML IJ SOLN
INTRAMUSCULAR | Status: AC
  Filled 2018-03-09: qty 2

## 2018-03-09 MED ORDER — NITROGLYCERIN 0.4 MG SL SUBL
0.4000 mg | SUBLINGUAL_TABLET | SUBLINGUAL | Status: DC | PRN
  Administered 2018-03-09 (×3): 0.4 mg via SUBLINGUAL
  Filled 2018-03-09: qty 1

## 2018-03-09 MED ORDER — PROPOFOL 10 MG/ML IV BOLUS
INTRAVENOUS | Status: AC
  Filled 2018-03-09: qty 20

## 2018-03-09 MED ORDER — NITROGLYCERIN 0.4 MG SL SUBL: 0.4000 mg | SUBLINGUAL_TABLET | SUBLINGUAL | Status: DC | PRN

## 2018-03-09 MED ORDER — PREGABALIN 75 MG PO CAPS
150.0000 mg | ORAL_CAPSULE | Freq: Three times a day (TID) | ORAL | Status: DC
  Administered 2018-03-09 – 2018-03-10 (×4): 150 mg via ORAL
  Filled 2018-03-09 (×4): qty 2

## 2018-03-09 MED ORDER — HYDROXYZINE HCL 25 MG PO TABS: 25.0000 mg | ORAL_TABLET | Freq: Three times a day (TID) | ORAL | Status: DC | PRN

## 2018-03-09 MED ORDER — ACETAMINOPHEN 650 MG RE SUPP
650.0000 mg | Freq: Four times a day (QID) | RECTAL | Status: DC | PRN
  Administered 2018-03-09: 650 mg via RECTAL
  Filled 2018-03-09: qty 1

## 2018-03-09 MED ORDER — HEPARIN (PORCINE) 25000 UT/250ML-% IV SOLN
1300.0000 [IU]/h | INTRAVENOUS | Status: DC
  Administered 2018-03-09: 1100 [IU]/h via INTRAVENOUS
  Filled 2018-03-09 (×2): qty 250

## 2018-03-09 MED ORDER — PANTOPRAZOLE SODIUM 40 MG IV SOLR
40.0000 mg | INTRAVENOUS | Status: DC
  Administered 2018-03-09 – 2018-03-15 (×6): 40 mg via INTRAVENOUS
  Filled 2018-03-09 (×6): qty 40

## 2018-03-09 MED ORDER — MORPHINE SULFATE (PF) 2 MG/ML IV SOLN
2.0000 mg | Freq: Once | INTRAVENOUS | Status: AC
  Administered 2018-03-09: 2 mg via INTRAVENOUS
  Filled 2018-03-09: qty 1

## 2018-03-09 MED ORDER — NICOTINE 14 MG/24HR TD PT24
14.0000 mg | MEDICATED_PATCH | Freq: Every day | TRANSDERMAL | Status: DC
  Administered 2018-03-09 – 2018-03-15 (×7): 14 mg via TRANSDERMAL
  Filled 2018-03-09 (×7): qty 1

## 2018-03-09 MED ORDER — ALBUTEROL SULFATE (2.5 MG/3ML) 0.083% IN NEBU: 2.5000 mg | INHALATION_SOLUTION | Freq: Four times a day (QID) | RESPIRATORY_TRACT | Status: DC | PRN

## 2018-03-09 MED ORDER — FLUTICASONE PROPIONATE 50 MCG/ACT NA SUSP
2.0000 | Freq: Every day | NASAL | Status: DC
  Administered 2018-03-13 – 2018-03-15 (×3): 2 via NASAL
  Filled 2018-03-09 (×2): qty 16

## 2018-03-09 MED ORDER — NITROGLYCERIN 0.4 MG SL SUBL
SUBLINGUAL_TABLET | SUBLINGUAL | Status: AC
  Filled 2018-03-09: qty 1

## 2018-03-09 MED ORDER — ROSUVASTATIN CALCIUM 20 MG PO TABS
20.0000 mg | ORAL_TABLET | Freq: Every day | ORAL | Status: DC
  Administered 2018-03-09 – 2018-03-14 (×5): 20 mg via ORAL
  Filled 2018-03-09 (×3): qty 1
  Filled 2018-03-09: qty 2
  Filled 2018-03-09: qty 1

## 2018-03-09 MED ORDER — SODIUM CHLORIDE 0.9 % IV BOLUS
500.0000 mL | Freq: Once | INTRAVENOUS | Status: AC
  Administered 2018-03-09: 500 mL via INTRAVENOUS

## 2018-03-09 MED ORDER — ACETAMINOPHEN 325 MG PO TABS
650.0000 mg | ORAL_TABLET | Freq: Four times a day (QID) | ORAL | Status: DC | PRN
  Administered 2018-03-09: 650 mg via ORAL
  Filled 2018-03-09: qty 2

## 2018-03-09 MED ORDER — ACETAMINOPHEN 650 MG RE SUPP: 325.0000 mg | Freq: Four times a day (QID) | RECTAL | Status: DC | PRN

## 2018-03-09 MED ORDER — VANCOMYCIN HCL 10 G IV SOLR
1250.0000 mg | INTRAVENOUS | Status: DC
  Administered 2018-03-10 – 2018-03-11 (×2): 1250 mg via INTRAVENOUS
  Filled 2018-03-09 (×2): qty 1250

## 2018-03-09 MED ORDER — HEPARIN BOLUS VIA INFUSION
3500.0000 [IU] | Freq: Once | INTRAVENOUS | Status: AC
  Administered 2018-03-09: 3500 [IU] via INTRAVENOUS
  Filled 2018-03-09: qty 3500

## 2018-03-09 MED ORDER — CARVEDILOL 25 MG PO TABS
25.0000 mg | ORAL_TABLET | Freq: Two times a day (BID) | ORAL | Status: DC
  Administered 2018-03-09 – 2018-03-12 (×7): 25 mg via ORAL
  Filled 2018-03-09 (×5): qty 1
  Filled 2018-03-09: qty 2
  Filled 2018-03-09: qty 1

## 2018-03-09 MED ORDER — SUCCINYLCHOLINE CHLORIDE 200 MG/10ML IV SOSY
PREFILLED_SYRINGE | INTRAVENOUS | Status: AC
  Filled 2018-03-09: qty 10

## 2018-03-09 MED ORDER — INSULIN ASPART 100 UNIT/ML ~~LOC~~ SOLN
0.0000 [IU] | Freq: Three times a day (TID) | SUBCUTANEOUS | Status: DC
  Administered 2018-03-09: 2 [IU] via SUBCUTANEOUS
  Administered 2018-03-09: 3 [IU] via SUBCUTANEOUS
  Administered 2018-03-11: 2 [IU] via SUBCUTANEOUS
  Administered 2018-03-11 – 2018-03-13 (×5): 3 [IU] via SUBCUTANEOUS
  Administered 2018-03-13: 2 [IU] via SUBCUTANEOUS
  Administered 2018-03-14: 3 [IU] via SUBCUTANEOUS
  Administered 2018-03-14 – 2018-03-15 (×3): 2 [IU] via SUBCUTANEOUS

## 2018-03-09 MED ORDER — POLYVINYL ALCOHOL 1.4 % OP SOLN: 1.0000 [drp] | Freq: Three times a day (TID) | OPHTHALMIC | Status: DC | PRN

## 2018-03-09 MED ORDER — POTASSIUM CHLORIDE IN NACL 40-0.9 MEQ/L-% IV SOLN
INTRAVENOUS | Status: AC
  Administered 2018-03-09: 100 mL/h via INTRAVENOUS
  Filled 2018-03-09 (×2): qty 1000

## 2018-03-09 MED ORDER — ONDANSETRON HCL 4 MG/2ML IJ SOLN
4.0000 mg | Freq: Three times a day (TID) | INTRAMUSCULAR | Status: DC | PRN
  Administered 2018-03-15: 4 mg via INTRAVENOUS
  Filled 2018-03-09: qty 2

## 2018-03-09 MED ORDER — LORATADINE 10 MG PO TABS
10.0000 mg | ORAL_TABLET | Freq: Every day | ORAL | Status: DC
  Administered 2018-03-11 – 2018-03-15 (×5): 10 mg via ORAL
  Filled 2018-03-09 (×5): qty 1

## 2018-03-09 MED ORDER — FENTANYL CITRATE (PF) 250 MCG/5ML IJ SOLN
INTRAMUSCULAR | Status: AC
  Filled 2018-03-09: qty 5

## 2018-03-09 MED ORDER — PREDNISOLONE ACETATE 1 % OP SUSP: 1.0000 [drp] | Freq: Two times a day (BID) | OPHTHALMIC | Status: DC | PRN

## 2018-03-09 MED ORDER — MIDAZOLAM HCL 2 MG/2ML IJ SOLN
INTRAMUSCULAR | Status: AC
  Filled 2018-03-09: qty 2

## 2018-03-09 MED ORDER — VANCOMYCIN HCL 10 G IV SOLR
1750.0000 mg | Freq: Once | INTRAVENOUS | Status: AC
  Administered 2018-03-09: 1750 mg via INTRAVENOUS
  Filled 2018-03-09: qty 1750

## 2018-03-09 MED ORDER — CALCIUM GLUCONATE-NACL 1-0.675 GM/50ML-% IV SOLN
1.0000 g | Freq: Once | INTRAVENOUS | Status: AC
  Administered 2018-03-09: 1000 mg via INTRAVENOUS
  Filled 2018-03-09: qty 50

## 2018-03-09 MED ORDER — ERYTHROMYCIN BASE 250 MG PO TABS: 250.0000 mg | ORAL_TABLET | Freq: Three times a day (TID) | ORAL | Status: DC

## 2018-03-09 MED ORDER — 0.9 % SODIUM CHLORIDE (POUR BTL) OPTIME
TOPICAL | Status: DC | PRN
  Administered 2018-03-09: 1000 mL

## 2018-03-09 MED ORDER — QUETIAPINE FUMARATE 25 MG PO TABS
25.0000 mg | ORAL_TABLET | Freq: Every day | ORAL | Status: DC
  Administered 2018-03-10: 25 mg via ORAL
  Filled 2018-03-09 (×2): qty 1

## 2018-03-09 MED ORDER — ACETAMINOPHEN 325 MG PO TABS
325.0000 mg | ORAL_TABLET | Freq: Four times a day (QID) | ORAL | Status: DC | PRN
  Administered 2018-03-09: 325 mg via ORAL
  Filled 2018-03-09: qty 1

## 2018-03-09 MED ORDER — NITROGLYCERIN 0.4 MG SL SUBL
0.4000 mg | SUBLINGUAL_TABLET | Freq: Once | SUBLINGUAL | Status: AC
  Administered 2018-03-09: 0.4 mg via SUBLINGUAL

## 2018-03-09 MED ORDER — LACTATED RINGERS IV SOLN
INTRAVENOUS | Status: DC
  Administered 2018-03-09: 08:00:00 via INTRAVENOUS

## 2018-03-09 MED ORDER — INSULIN GLARGINE 100 UNIT/ML ~~LOC~~ SOLN
40.0000 [IU] | Freq: Two times a day (BID) | SUBCUTANEOUS | Status: DC
  Filled 2018-03-09: qty 0.4

## 2018-03-09 MED ORDER — AMLODIPINE BESYLATE 10 MG PO TABS
10.0000 mg | ORAL_TABLET | Freq: Every day | ORAL | Status: DC
  Administered 2018-03-10 – 2018-03-12 (×3): 10 mg via ORAL
  Filled 2018-03-09 (×3): qty 1

## 2018-03-09 SURGICAL SUPPLY — 26 items
BNDG GAUZE ELAST 4 BULKY (GAUZE/BANDAGES/DRESSINGS) IMPLANT
COVER MAYO STAND STRL (DRAPES) ×2 IMPLANT
COVER SURGICAL LIGHT HANDLE (MISCELLANEOUS) ×2 IMPLANT
COVER WAND RF STERILE (DRAPES) ×2 IMPLANT
ELECT CAUTERY BLADE 6.4 (BLADE) ×2 IMPLANT
ELECT REM PT RETURN 9FT ADLT (ELECTROSURGICAL) ×2
ELECTRODE REM PT RTRN 9FT ADLT (ELECTROSURGICAL) ×1 IMPLANT
GAUZE SPONGE 4X4 12PLY STRL (GAUZE/BANDAGES/DRESSINGS) IMPLANT
GLOVE BIO SURGEON STRL SZ7 (GLOVE) ×2 IMPLANT
GLOVE BIOGEL PI IND STRL 7.5 (GLOVE) ×1 IMPLANT
GLOVE BIOGEL PI INDICATOR 7.5 (GLOVE) ×1
GOWN STRL REUS W/ TWL LRG LVL3 (GOWN DISPOSABLE) ×2 IMPLANT
GOWN STRL REUS W/TWL LRG LVL3 (GOWN DISPOSABLE) ×2
KIT BASIN OR (CUSTOM PROCEDURE TRAY) ×2 IMPLANT
KIT TURNOVER KIT B (KITS) ×2 IMPLANT
NS IRRIG 1000ML POUR BTL (IV SOLUTION) ×2 IMPLANT
PACK LITHOTOMY IV (CUSTOM PROCEDURE TRAY) ×2 IMPLANT
PAD ARMBOARD 7.5X6 YLW CONV (MISCELLANEOUS) ×2 IMPLANT
PENCIL BUTTON HOLSTER BLD 10FT (ELECTRODE) ×2 IMPLANT
SPONGE LAP 18X18 X RAY DECT (DISPOSABLE) ×2 IMPLANT
SURGILUBE 2OZ TUBE FLIPTOP (MISCELLANEOUS) ×2 IMPLANT
SYR BULB 3OZ (MISCELLANEOUS) ×2 IMPLANT
TOWEL OR 17X24 6PK STRL BLUE (TOWEL DISPOSABLE) ×2 IMPLANT
TOWEL OR 17X26 10 PK STRL BLUE (TOWEL DISPOSABLE) ×2 IMPLANT
TUBE CONNECTING 12X1/4 (SUCTIONS) ×2 IMPLANT
YANKAUER SUCT BULB TIP NO VENT (SUCTIONS) ×2 IMPLANT

## 2018-03-09 NOTE — Progress Notes (Signed)
ANTICOAGULATION CONSULT NOTE - Initial Consult  Pharmacy Consult for heparin Indication: chest pain/ACS  Allergies  Allergen Reactions  . Desvenlafaxine Other (See Comments)    REACTION: dysphoria/dellusional  . Duloxetine Nausea And Vomiting and Other (See Comments)    REACTION: "wheezing" and tremors, dysphoria  . Gabapentin Other (See Comments)    Itching, felt crawling sensation inside  . Hydrocodone Itching  . Latex Itching    Denies airway, lung involvement  . Tramadol Itching  . Levofloxacin Other (See Comments)    Shortness of breath with headache  . Lithium Rash    Patient Measurements: Height: 5\' 3"  (160 cm) Weight: 202 lb 13.2 oz (92 kg) IBW/kg (Calculated) : 52.4 Heparin Dosing Weight: 73.4 kg  Vital Signs: Temp: 99.1 F (37.3 C) (11/19 1505) Temp Source: Oral (11/19 0605) BP: 154/75 (11/19 1727) Pulse Rate: 103 (11/19 1727)  Labs: Recent Labs    03/08/18 2058 03/09/18 0542 03/09/18 1544  HGB 10.9* 10.3*  --   HCT 36.5 33.9*  --   PLT 183 148*  --   LABPROT  --  15.0  --   INR  --  1.19  --   CREATININE 1.28* 1.09*  --   TROPONINI  --   --  0.19*    Estimated Creatinine Clearance: 52.4 mL/min (A) (by C-G formula based on SCr of 1.09 mg/dL (H)).   Assessment: was scheduled to have I&D of abcess 03/09/18. Unfortunately, after being taken back to the OR and placed on telemetery, the patient was found to have significant T wave inversion and ST depression. Troponin 0.19. Pharmacy consulted to start heparin. No AC PTA. Hgb 10.3, Plt 148.  Goal of Therapy:  Heparin level 0.3-0.7 units/ml Monitor platelets by anticoagulation protocol: Yes   Plan:  Give 3500 units bolus x 1 Start heparin infusion at 1100 units/hr Check anti-Xa level in 6 hours and daily while on heparin Continue to monitor H&H and platelets   Thank you for allowing [place] to participate in this patients care.   Jens Som, PharmD Please utilize [facility] (under [place]) for  appropriate number for your unit pharmacist. 03/09/2018 5:54 PM

## 2018-03-09 NOTE — Progress Notes (Signed)
Pharmacy Antibiotic Note  [name] [name] is a 67 y.o. female admitted on 03/08/2018 with perirectal abscess.  Pharmacy has been consulted for vancomycin dosing.  Plan: Vancomycin 1750 mg IV x 1 then 1250mg  Iv q24 hours F/u renal function, cultures and clinical course     Temp (24hrs), Avg:101.1 F (38.4 C), Min:100.3 F (37.9 C), Max:101.8 F (38.8 C)  Recent Labs  Lab 03/08/18 2058 03/08/18 2315  WBC 17.7*  --   CREATININE 1.28*  --   LATICACIDVEN  --  3.42*    CrCl cannot be calculated (Unknown ideal weight.).    Allergies  Allergen Reactions  . Desvenlafaxine Other (See Comments)    REACTION: dysphoria/dellusional  . Duloxetine Nausea And Vomiting and Other (See Comments)    REACTION: "wheezing" and tremors, dysphoria  . Gabapentin Other (See Comments)    Itching, felt crawling sensation inside  . Hydrocodone Itching  . Latex Itching    Denies airway, lung involvement  . Tramadol Itching  . Levofloxacin Other (See Comments)    Shortness of breath with headache  . Lithium Rash     Thank you for allowing pharmacy to be a part of this patient's care.  Excell Seltzer Poteet 03/09/2018 1:09 AM

## 2018-03-09 NOTE — Anesthesia Preprocedure Evaluation (Signed)
Anesthesia Evaluation  Patient identified by MRN, date of birth, ID band Patient awake    Reviewed: Allergy & Precautions, NPO status , Patient's Chart, lab work & pertinent test results  Airway Mallampati: I  TM Distance: >3 FB Neck ROM: Full    Dental   Pulmonary Current Smoker,    Pulmonary exam normal        Cardiovascular hypertension, Pt. on medications + CAD  Normal cardiovascular exam     Neuro/Psych Anxiety Depression Schizophrenia CVA    GI/Hepatic GERD  Medicated and Controlled,  Endo/Other  diabetes, Type 2, Insulin Dependent  Renal/GU      Musculoskeletal   Abdominal   Peds  Hematology   Anesthesia Other Findings   Reproductive/Obstetrics                             Anesthesia Physical Anesthesia Plan  ASA: III  Anesthesia Plan: General   Post-op Pain Management:    Induction: Intravenous  PONV Risk Score and Plan: 2 and Ondansetron  Airway Management Planned: LMA  Additional Equipment:   Intra-op Plan:   Post-operative Plan: Extubation in OR  Informed Consent: I have reviewed the patients History and Physical, chart, labs and discussed the procedure including the risks, benefits and alternatives for the proposed anesthesia with the patient or authorized representative who has indicated his/her understanding and acceptance.     Plan Discussed with: CRNA and Surgeon  Anesthesia Plan Comments:         Anesthesia Quick Evaluation

## 2018-03-09 NOTE — Progress Notes (Signed)
Attempted to see pt. Pt at surgery.  Will return as able.   Jinger Neighbors, [place] [phone]

## 2018-03-09 NOTE — Consult Note (Signed)
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 Prominent soft tissue gas in the perianal region and extending inferiorly to the perineal fat bilaterally. This could be due to bowel fistula or soft tissue infection from gas-forming organism. 2. Diffuse fatty infiltration of the liver. 3. Small esophageal hiatal hernia. Small anterior abdominal wall hernia containing fat. Aortic Atherosclerosis (ICD10-I70.0). Electronically Signed   By: Lucienne Capers M.D.   On: 03/09/2018 01:04   Assessment and Plan:   1. Abnormal EKG: -Pt presented to [facility] on 03/08/18 with complaints of buttocks pain, recent fever, chills and nausea found to be septic from a perirectal abscess found on CT scan. Surgery team was consulted and the patient was scheduled to have an I&D of the  abcess today, 03/09/18. Unfortunately, after being taken back to the OR and placed on telemetery, the patient was found to have significant T wave inversion in leads I, II, aVL, V4-V6 and ST depression in V4-V6.  -Given the above, the surgery was cancelled and the pt was taken to PACU for cardiology evaluation -In interview, pt reports intermittent, progressive exertional chest pain over the last several years which has become more frequent in duration. She reports last chest pain yesterday while in the ED. She has not sought medical attention for this. History moderately difficult to obtain given recent versed administration>>seen in PACU  -Of note, she had an echocardiogram in 10/2009 which showed normal LV function, no valvular disease, mild LVH and a stress myoview on 01/20/2011 with no evidence of ischemia. Cardiac cath performed on 11/10/11 which showed mild plaque in the RCA  but no disease in the LAD or Circumflex. Her LV function was normal at that time.   -Obtain troponin now, then q 6H>>if elevated or abnormal echo from prior, will likely need further ischemic workup however her situation is complicated bya cute infection and the need for surgery. Not an ideal candidate for either general anesthesia or cath given the above>>will obtain coronary CTA for further evaluation given the above -Currently having chest pain>>given SL NTG and will obtain EKG now   2. AKI: -Creatinine, 1.28 today>>likely in the setting of acute infection  -Baseline appears to be in the 0.8 range -s/p IVF resuscitation on presentation -Avoid nephrotoxic medications   3. DM2: -Uncontrolled, HbA1C, 7.2 in 11/2017 -SSI for glucose control while inpatient status -Per primary team   4. Hypertension:  -Stable, 104/45>107/43, 105/38>113/55 -On home losartan 100, amlodipine 10, coreg 25 and spironolactone 25 for control -Spironolactone and losartan on hold given AKI   5. HLD: -Stable, LDL 65 in 2017   For questions or  updates, please contact [place] Please consult [email or web address] for contact info under Cardiology/STEMI.   SignedKathyrn Drown NP-C [facility] Pager: [phone] 03/09/2018 11:54 AM
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
## 2018-03-09 NOTE — ED Notes (Signed)
Pt son Coralyn Mark [phone] or [phone]

## 2018-03-09 NOTE — Progress Notes (Signed)
When pt was placed on EKG in the OR, pronounced ST depression and inverted t waves were noticed. Pt was asymptomatic sedated with versed.   Repeat EKG confirmed change from yesterday's EKG. Showed the EKGs to Dr Jasper Riling who agreed that we should not proceed and have her evaluated by the cardiology service.  She will be seen in PACU.  Lillia Abed MD

## 2018-03-09 NOTE — ED Provider Notes (Signed)
Care assumed from Dr. Eulis Foster, patient with perirectal abscess clinically, elevated lactic acid.  Lactic acid has normalized with hydration.  Antibiotics have been administered.  CT confirms perianal abscess with gas in the soft tissues.  She is also noted to have a mild acute kidney injury with creatinine 1.28 (was 0.82 on December 02, 2017).  Case is discussed with Dr. Ninfa Linden of general surgery service who request the patient be admitted to hospitalist service.  Case is discussed with the resident on call for family practice service, who agrees to admit the patient.  Results for orders placed or performed during the hospital encounter of 61/95/09  Basic metabolic panel  Result Value Ref Range   Sodium 135 135 - 145 mmol/L   Potassium 3.2 (L) 3.5 - 5.1 mmol/L   Chloride 101 98 - 111 mmol/L   CO2 22 22 - 32 mmol/L   Glucose, Bld 232 (H) 70 - 99 mg/dL   BUN 13 8 - 23 mg/dL   Creatinine, Ser 1.28 (H) 0.44 - 1.00 mg/dL   Calcium 8.4 (L) 8.9 - 10.3 mg/dL   GFR calc non Af Amer 42 (L) >60 mL/min   GFR calc Af Amer 48 (L) >60 mL/min   Anion gap 12 5 - 15  CBC  Result Value Ref Range   WBC 17.7 (H) 4.0 - 10.5 K/uL   RBC 4.03 3.87 - 5.11 MIL/uL   Hemoglobin 10.9 (L) 12.0 - 15.0 g/dL   HCT 36.5 36.0 - 46.0 %   MCV 90.6 80.0 - 100.0 fL   MCH 27.0 26.0 - 34.0 pg   MCHC 29.9 (L) 30.0 - 36.0 g/dL   RDW 13.4 11.5 - 15.5 %   Platelets 183 150 - 400 K/uL   nRBC 0.0 0.0 - 0.2 %  CBG monitoring, ED  Result Value Ref Range   Glucose-Capillary 217 (H) 70 - 99 mg/dL   Comment 1 Notify RN    Comment 2 Document in Chart   I-Stat CG4 Lactic Acid, ED  Result Value Ref Range   Lactic Acid, Venous 3.42 (HH) 0.5 - 1.9 mmol/L   Comment NOTIFIED PHYSICIAN   I-Stat CG4 Lactic Acid, ED  Result Value Ref Range   Lactic Acid, Venous 0.92 0.5 - 1.9 mmol/L   Ct Abdomen Pelvis W Contrast  Result Date: 03/09/2018 CLINICAL DATA:  Generalize weakness, nausea, and diarrhea for 3 days. History of abscess on the  buttocks. EXAM: CT ABDOMEN AND PELVIS WITH CONTRAST TECHNIQUE: Multidetector CT imaging of the abdomen and pelvis was performed using the standard protocol following bolus administration of intravenous contrast. CONTRAST:  176mL ISOVUE-300 IOPAMIDOL (ISOVUE-300) INJECTION 61% COMPARISON:  MRI abdomen 01/17/2016. CT abdomen and pelvis 01/14/2016. FINDINGS: Lower chest: Dependent changes in the lung bases. Small esophageal hiatal hernia. Hepatobiliary: Diffuse fatty infiltration of the liver. No focal liver abnormality is seen. Status post cholecystectomy. No biliary dilatation. Pancreas: Unremarkable. No pancreatic ductal dilatation or surrounding inflammatory changes. Spleen: Normal in size without focal abnormality. Adrenals/Urinary Tract: Adrenal glands are unremarkable. Kidneys are normal, without renal calculi, focal lesion, or hydronephrosis. Bladder is unremarkable. Stomach/Bowel: Stomach, small bowel, and colon are not abnormally distended. No wall thickening is appreciated. Gas and fluid collection in the right perirectal space measuring up to about 2.1 cm diameter and extending to just above the levator ani muscles. Prominent soft tissue gas demonstrated in the perianal region and extending inferiorly to the perineal fat bilaterally. This could be due to bowel fistula or infection from gas-forming  organism. Vascular/Lymphatic: Aortic atherosclerosis. No enlarged abdominal or pelvic lymph nodes. Reproductive: Status post hysterectomy. No adnexal masses. Other: No free air or free fluid in the abdomen. Anterior abdominal wall hernia near the umbilicus containing fat. Musculoskeletal: No acute or significant osseous findings. IMPRESSION: 1. Right perianal abscess extending to just above the levator ani muscles. Prominent soft tissue gas in the perianal region and extending inferiorly to the perineal fat bilaterally. This could be due to bowel fistula or soft tissue infection from gas-forming organism. 2.  Diffuse fatty infiltration of the liver. 3. Small esophageal hiatal hernia. Small anterior abdominal wall hernia containing fat. Aortic Atherosclerosis (ICD10-I70.0). Electronically Signed   By: Lucienne Capers M.D.   On: 95/10/2255 50:51        Delora Fuel, MD 83/35/82 0210

## 2018-03-09 NOTE — Transfer of Care (Signed)
Immediate Anesthesia Transfer of Care Note  Patient: [name] [name]  Procedure(s) Performed: CANCELLED PROCEDURE  IRRIGATION AND DEBRIDEMENT PERIRECTAL ABSCESS (N/A Retroperitoneal)  Patient Location: PACU  Anesthesia Type:case cancelled  Level of Consciousness: awake, alert , oriented and patient cooperative  Airway & Oxygen Therapy: Patient Spontanous Breathing and Patient connected to nasal cannula oxygen  Post-op Assessment: Report given to RN, Post -op Vital signs reviewed and stable and Patient moving all extremities X 4  Post vital signs: Reviewed and stable  Last Vitals:  Vitals Value Taken Time  BP 101/44 03/09/2018  9:37 AM  Temp    Pulse 92 03/09/2018  9:42 AM  Resp 17 03/09/2018  9:42 AM  SpO2 100 % 03/09/2018  9:42 AM  Vitals shown include unvalidated device data.  Last Pain:  Vitals:   03/09/18 0745  TempSrc:   PainSc: 0-No pain         Complications: No apparent anesthesia complications

## 2018-03-09 NOTE — Progress Notes (Signed)
Patient ID: [name] [name], female   DOB: Dec 18, 1950, 67 y.o.   MRN: [id number]  I have assumed her surgical care.  Will proceed with incision and drainage of perirectal abscess.  The surgical procedure has been discussed with the patient.  Potential risks, benefits, alternative treatments, and expected outcomes have been explained.  All of the patient's questions at this time have been answered.  The likelihood of reaching the patient's treatment goal is good.  The patient understand the proposed surgical procedure and wishes to proceed.   She also has a recurrent ventral hernia.  I will address this later as an outpatient.  [name]. [name], MD, [facility] Surgery  General/ Trauma Surgery Beeper [phone]  03/09/2018 7:58 AM
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
## 2018-03-09 NOTE — Progress Notes (Signed)
CRITICAL VALUE ALERT  Critical Value:  Trop 0.19  Date & Time Notied:  03-09-18,1758  Provider Notified: Dr Maudie Mercury  Orders Received/Actions taken: chest pain 10/10. No PRN available. Refer to orders

## 2018-03-09 NOTE — Progress Notes (Signed)
Family Medicine Teaching Service Daily Progress Note Intern Pager: [phone]  Patient name: [name] [name] Medical record number: [id number] Date of birth: Jun 11, 1950 Age: 67 y.o. Gender: female  Primary Care Provider: [name], DO Consultants: Surgery, PT/OT, Cards Code Status: Full  Pt Overview and Major Events to Date:  7/19 - admitted for sepsis secondary to perianal abscess  Assessment and Plan: [name] [name] is a 67 year old female presenting with generalized weakness and fatigue found to have sepsis due to perianal abscess.  PMH significant for T2DM diabetes with gastroparesis and peripheral neuropathy, HTN, CAD with stable angina, HFpEF, hyperlipidemia, major depression with psychotic features, anxiety, and fibromyalgia.  Sepsis secondary to perirectal abscess: T-max overnight 103 F, reduced to 99.6 F this a.m. 11/19.  Blood pressure stable this a.m. 124/35.  Heart rate elevated between 104 and 115 in setting of sepsis, dehydration, and pain. WBC count improved from 17.7 > 14.7 11/19.  Lactic acid 3.42 > 0.92 11/19 s/p IV fluids. -General surgery consulted - appreciate recs  -IV vancomycin and ceftriaxone, narrow as appropriate line -erythromycin tablet 250 mg 3 times daily with meals and bedtime -mIVF NS @ 100cc/hr -Tylenol PRN fever and pain -Zofran PRN nausea/vomiting -Vitals per routine -Blood cultures collected and pending -PT/OT -P.o. since admission.  Advance diet as tolerated  EKG Changes: the patient was used on EKG monitors in the operating room and ST segment depressions and inverted T waves were noticed.  At the time the patient was sedated with Versed.  A repeat EKG confirmed the change from prior exam.  Surgical intervention (I&D) for the perianal abscess was canceled pending cardiology review and work-up. -Cardiology consulted: patient will likely need further ischemic work-up, however situation is complicated by acute infection with need for surgery.   Will obtain coronary CTA. - Appreciate recs -Trend troponins -Sublingual nitroglycerin -Repeat EKG  Hypokalemia: Patient presenting with K 3.2 on admission and again 11/19.  Takes HCTZ at home. Given K in IV fluids. -Monitor BMP -Replete with K-Dur 40 mEq twice daily  AKI: Creatinine 1.28 on admission reduced to 1.09 on 11/19.  Baseline 0.8. -Monitor BMP -mIVFs -Avoid nephrotoxic medications  Dysuria: She reports 1 week history of dysuria, urinary frequency, and urgency with suprapubic tenderness. -Obtain UA -Continue ceftriaxone as above, will additionally cover potential UTI.  Hypertension, chronic, stable: BP 124/35 11/19.  Heart rate elevated to 104-115, potentially in setting of dehydration, sepsis, and pain. Patient takes losartan 100 mg, amlodipine 10 mg, Coreg 25 mg twice daily, and spironolactone 25 mg daily for control -Hold home spironolactone and losartan given AKI -Continue carvedilol 25 mg twice daily with meals -Continue amlodipine 10 mg daily -Monitor BP  T2DM with gastroparesis and peripheral neuropathy, chronic, poorly controlled: A1c 7.2 % 11/2017.  CBG 232 on admit, 182 on 11/19.  Patient prescribed Lantus 40 mg twice daily and NovoLog 15 units up to twice daily with meals. -Obtain A1c -mSSI -Lantus 20 units BID -CBGs ACHS  Depression with psychotic features: -Hydroxyzine 25 mg 3 times daily as needed anxiety, itching  Tobacco use disorder: -Nicotine patch 14 mg  Allergies: -Claritin 10mg  daily -Hydroxyzine 25 mg 3 times daily as needed anxiety, itching -Flonase -Prednisolone eyedrops twice daily as needed itching  FEN/GI: IV NS 100cc/hr, n.p.o., advance diet as tolerated, Protonix PPx: SCDs  Disposition: Admitted to med-surg  Subjective:  Patient is finally back in her room after being in the PACU following trip to OR with new EKG findings. She states she is having rectal pain  made worse with movement and lying in certain positions. She denies  chest pain at this time. She states the chest pain has been increasingly worse with exertion the last couple of weeks. She has no other concerns or questions at this time.  Objective: Temp:  [99.6 F (37.6 C)-103 F (39.4 C)] 99.6 F (37.6 C) (11/19 0605) Pulse Rate:  [104-115] 104 (11/19 0824) Resp:  [16-36] 16 (11/19 0334) BP: (122-155)/(35-89) 124/35 (11/19 0824) SpO2:  [91 %-96 %] 91 % (11/19 0334) Weight:  [92 kg] 92 kg (11/19 0835)  Physical Exam  Constitutional: She appears well-developed and well-nourished.  Cardiovascular: Regular rhythm, normal heart sounds and intact distal pulses.  Tachycardic  Pulmonary/Chest: Effort normal and breath sounds normal.  Abdominal: Soft. Bowel sounds are normal. She exhibits no distension.  Obese abdomen  Musculoskeletal: She exhibits no edema.  SCDs in place to bilateral LEs.  Skin: Skin is warm. Capillary refill takes less than 2 seconds.   Laboratory: Recent Labs  Lab 03/08/18 2058 03/09/18 0542  WBC 17.7* 14.7*  HGB 10.9* 10.3*  HCT 36.5 33.9*  PLT 183 148*   Recent Labs  Lab 03/08/18 2058 03/09/18 0542  NA 135 136  K 3.2* 3.2*  CL 101 104  CO2 22 24  BUN 13 13  CREATININE 1.28* 1.09*  CALCIUM 8.4* 7.7*  PROT  --  6.5  BILITOT  --  0.7  ALKPHOS  --  56  ALT  --  10  AST  --  16  GLUCOSE 232* 182*   Urinalysis: needs to be collected  Imaging/Diagnostic Tests: Ct Abdomen Pelvis W Contrast 11/19: 1. Right perianal abscess extending to just above the levator ani muscles. Prominent soft tissue gas in the perianal region and extending inferiorly to the perineal fat bilaterally. This could be due to bowel fistula or soft tissue infection from gas-forming organism.  2. Diffuse fatty infiltration of the liver.  3. Small esophageal hiatal hernia. Small anterior abdominal wall hernia containing fat. Aortic Atherosclerosis    Daisy Floro, DO 03/09/2018, 8:40 AM PGY-1, [place] Intern  pager: [phone], text pages welcome

## 2018-03-09 NOTE — Anesthesia Postprocedure Evaluation (Signed)
Anesthesia Post Note  Patient: [name] [name]  Procedure(s) Performed: CANCELLED PROCEDURE   (N/A Retroperitoneal)     Comments: Pt Cancelled due to EKG changes -> PACU for cardiology consult.    Last Vitals:  Vitals:   03/09/18 1007 03/09/18 1015  BP: (!) 90/49   Pulse: 91 92  Resp: (!) 25 (!) 25  Temp:    SpO2: 100% 100%    Last Pain:  Vitals:   03/09/18 0745  TempSrc:   PainSc: 0-No pain                 [name] [name] DAVID

## 2018-03-09 NOTE — Progress Notes (Signed)
PT Cancellation Note  Patient Details Name: [name] [name] MRN: [id number] DOB: 1950/12/09   Cancelled Treatment:    Reason Eval/Treat Not Completed: (P) Patient at procedure or test/unavailable Pt in surgery this am. PT will follow back for evaluation as able.  Nilsa Macht B. Migdalia Dk PT, DPT Acute Rehabilitation Services Pager [phone] Office [phone]  [place] 03/09/2018, 8:12 AM

## 2018-03-09 NOTE — Progress Notes (Addendum)
FPTS Interim Progress Note:  S:  Paged by RN for increasing somnolence and 103.59F temperature. Evaluated patient at bedside. She states she is feeling fine, denies chest pain or SOB. Wants to "get this out of me" in referring to her perirectal abscess. Received 2mg  morphine about an hour ago.   O: Blood pressure (!) 133/55, pulse (!) 113, temperature (!) 103.8 F (39.9 C), temperature source Rectal, resp. rate (!) 22, height 5\' 3"  (1.6 m), weight 92 kg, SpO2 95 %.  Gen: Intermittently alert. NAD  Cardiac: Irregular rate and rhythm, tachycardic, no m/g/r Lungs: CTA anteriorly, no increased WOB, satting well on 2L   Abdomen: Soft, non-tender non-distended, normoactive BS Ext: Warm, dry, palpable distal pulses, no LE edema   Neuro: Waxing and waning alertness, arousable, oriented x2, both of which similar to admission. Able to follow commands and responds appropriately to questions. PERRL. 2/4 patellar and bicep reflexes.   A/P: Increased somnolence likely multifactorial in setting of severe sepsis with generalized somnolence noted on admission in addition to recent morphine she received. Able to protect her airway. She has remained tachycardic for the duration of her stay, becoming more irregular with PACs, however without chest pain currently and on heparin drip and cards on board.  -Tylenol 650mg  rectally for fever -Transfer to stepdown care  -Replete K in mIVF  -Monitor troponin, HR/rhythm, and symptoms of chest pain-will call cardiology if any changes  -Will obtain ABG if mental status/alertness declines further  -Continued care- hopeful plan for OR tomorrow for perirectal abscess   Will continue to monitor mental status and fever curve/vitals closely.   Patriciaann Clan, DO  Family Medicine PGY-1

## 2018-03-10 ENCOUNTER — Encounter (HOSPITAL_COMMUNITY): Payer: Self-pay | Admitting: Orthopedic Surgery

## 2018-03-10 ENCOUNTER — Encounter (HOSPITAL_COMMUNITY)
Admission: EM | Disposition: A | Discharge status: Home Health | Payer: Self-pay | Source: Home / Self Care | Attending: Family Medicine

## 2018-03-10 ENCOUNTER — Inpatient Hospital Stay (HOSPITAL_COMMUNITY): Payer: Medicare Other | Admitting: Certified Registered"

## 2018-03-10 ENCOUNTER — Inpatient Hospital Stay (HOSPITAL_COMMUNITY): Payer: Medicare Other

## 2018-03-10 DIAGNOSIS — R7989 Other specified abnormal findings of blood chemistry: Secondary | ICD-10-CM

## 2018-03-10 DIAGNOSIS — K611 Rectal abscess: Secondary | ICD-10-CM

## 2018-03-10 DIAGNOSIS — I10 Essential (primary) hypertension: Secondary | ICD-10-CM

## 2018-03-10 DIAGNOSIS — E1169 Type 2 diabetes mellitus with other specified complication: Secondary | ICD-10-CM

## 2018-03-10 DIAGNOSIS — E785 Hyperlipidemia, unspecified: Secondary | ICD-10-CM

## 2018-03-10 DIAGNOSIS — R9431 Abnormal electrocardiogram [ECG] [EKG]: Secondary | ICD-10-CM

## 2018-03-10 DIAGNOSIS — N179 Acute kidney failure, unspecified: Secondary | ICD-10-CM

## 2018-03-10 HISTORY — PX: INCISION AND DRAINAGE PERIRECTAL ABSCESS: SHX1804

## 2018-03-10 LAB — COMPREHENSIVE METABOLIC PANEL
ALT: 11 U/L (ref 0–44)
AST: 21 U/L (ref 15–41)
Albumin: 2.5 g/dL — ABNORMAL LOW (ref 3.5–5.0)
Alkaline Phosphatase: 57 U/L (ref 38–126)
Anion gap: 7 (ref 5–15)
BUN: 12 mg/dL (ref 8–23)
CO2: 23 mmol/L (ref 22–32)
Calcium: 7.6 mg/dL — ABNORMAL LOW (ref 8.9–10.3)
Chloride: 107 mmol/L (ref 98–111)
Creatinine, Ser: 1.04 mg/dL — ABNORMAL HIGH (ref 0.44–1.00)
GFR calc Af Amer: >60 mL/min (ref >60)
GFR calc non Af Amer: 54 mL/min — ABNORMAL LOW (ref >60)
Glucose, Bld: 125 mg/dL — ABNORMAL HIGH (ref 70–99)
Potassium: 3.4 mmol/L — ABNORMAL LOW (ref 3.5–5.1)
Sodium: 137 mmol/L (ref 135–145)
Total Bilirubin: 0.6 mg/dL (ref 0.3–1.2)
Total Protein: 6.3 g/dL — ABNORMAL LOW (ref 6.5–8.1)

## 2018-03-10 LAB — CBC WITH DIFFERENTIAL/PLATELET
Abs Immature Granulocytes: 0.1 10*3/uL — ABNORMAL HIGH (ref 0.00–0.07)
Basophils Absolute: 0.1 10*3/uL (ref 0.0–0.1)
Basophils Relative: 0 %
Eosinophils Absolute: 0 10*3/uL (ref 0.0–0.5)
Eosinophils Relative: 0 %
HCT: 31.7 % — ABNORMAL LOW (ref 36.0–46.0)
Hemoglobin: 9.6 g/dL — ABNORMAL LOW (ref 12.0–15.0)
Immature Granulocytes: 1 %
Lymphocytes Relative: 17 %
Lymphs Abs: 2.3 10*3/uL (ref 0.7–4.0)
MCH: 27.4 pg (ref 26.0–34.0)
MCHC: 30.3 g/dL (ref 30.0–36.0)
MCV: 90.6 fL (ref 80.0–100.0)
Monocytes Absolute: 1 10*3/uL (ref 0.1–1.0)
Monocytes Relative: 7 %
Neutro Abs: 9.9 10*3/uL — ABNORMAL HIGH (ref 1.7–7.7)
Neutrophils Relative %: 75 %
Platelets: 132 10*3/uL — ABNORMAL LOW (ref 150–400)
RBC: 3.5 MIL/uL — ABNORMAL LOW (ref 3.87–5.11)
RDW: 13.7 % (ref 11.5–15.5)
WBC: 13.3 10*3/uL — ABNORMAL HIGH (ref 4.0–10.5)
nRBC: 0 % (ref 0.0–0.2)

## 2018-03-10 LAB — NM MYOCAR MULTI W/SPECT W/WALL MOTION / EF
Estimated workload: 1 METS
Exercise duration (min): 5 min
Exercise duration (sec): 1 s
Peak HR: 96 {beats}/min
Rest HR: 92 {beats}/min

## 2018-03-10 LAB — URINALYSIS, ROUTINE W REFLEX MICROSCOPIC
Bilirubin Urine: NEGATIVE
Glucose, UA: NEGATIVE mg/dL
Ketones, ur: NEGATIVE mg/dL
Nitrite: NEGATIVE
Protein, ur: NEGATIVE mg/dL
Specific Gravity, Urine: 1.031 — ABNORMAL HIGH (ref 1.005–1.030)
pH: 5 (ref 5.0–8.0)

## 2018-03-10 LAB — GLUCOSE, CAPILLARY
Glucose-Capillary: 103 mg/dL — ABNORMAL HIGH (ref 70–99)
Glucose-Capillary: 112 mg/dL — ABNORMAL HIGH (ref 70–99)
Glucose-Capillary: 119 mg/dL — ABNORMAL HIGH (ref 70–99)
Glucose-Capillary: 122 mg/dL — ABNORMAL HIGH (ref 70–99)
Glucose-Capillary: 229 mg/dL — ABNORMAL HIGH (ref 70–99)
Glucose-Capillary: 92 mg/dL (ref 70–99)
Glucose-Capillary: 96 mg/dL (ref 70–99)

## 2018-03-10 LAB — TROPONIN I: Troponin I: 0.18 ng/mL (ref <0.03)

## 2018-03-10 LAB — PHOSPHORUS: Phosphorus: 2.8 mg/dL (ref 2.5–4.6)

## 2018-03-10 LAB — MAGNESIUM: Magnesium: 1.5 mg/dL — ABNORMAL LOW (ref 1.7–2.4)

## 2018-03-10 LAB — HEPARIN LEVEL (UNFRACTIONATED)
Heparin Unfractionated: 0.2 IU/mL — ABNORMAL LOW (ref 0.30–0.70)
Heparin Unfractionated: <0.1 IU/mL — ABNORMAL LOW (ref 0.30–0.70)

## 2018-03-10 SURGERY — INCISION AND DRAINAGE, ABSCESS, PERIRECTAL
Anesthesia: General | Site: Rectum

## 2018-03-10 MED ORDER — PROPOFOL 10 MG/ML IV BOLUS
INTRAVENOUS | Status: DC | PRN
  Administered 2018-03-10: 200 mg via INTRAVENOUS

## 2018-03-10 MED ORDER — FENTANYL CITRATE (PF) 100 MCG/2ML IJ SOLN: 25.0000 ug | INTRAMUSCULAR | Status: DC | PRN

## 2018-03-10 MED ORDER — TECHNETIUM TC 99M TETROFOSMIN IV KIT
10.0000 | PACK | Freq: Once | INTRAVENOUS | Status: AC | PRN
  Administered 2018-03-10: 10 via INTRAVENOUS

## 2018-03-10 MED ORDER — OXYCODONE HCL 5 MG PO TABS: 5.0000 mg | ORAL_TABLET | Freq: Once | ORAL | Status: DC | PRN

## 2018-03-10 MED ORDER — ACETAMINOPHEN 10 MG/ML IV SOLN: 1000.0000 mg | Freq: Once | INTRAVENOUS | Status: DC | PRN

## 2018-03-10 MED ORDER — MORPHINE SULFATE (PF) 2 MG/ML IV SOLN: 2.0000 mg | INTRAVENOUS | Status: DC | PRN

## 2018-03-10 MED ORDER — ACETAMINOPHEN 650 MG RE SUPP
650.0000 mg | Freq: Four times a day (QID) | RECTAL | Status: DC | PRN
  Administered 2018-03-11: 650 mg via RECTAL
  Filled 2018-03-10 (×2): qty 1

## 2018-03-10 MED ORDER — ACETAMINOPHEN 160 MG/5ML PO SOLN: 1000.0000 mg | Freq: Once | ORAL | Status: DC | PRN

## 2018-03-10 MED ORDER — REGADENOSON 0.4 MG/5ML IV SOLN
INTRAVENOUS | Status: AC
  Administered 2018-03-10: 0.4 mg via INTRAVENOUS
  Filled 2018-03-10: qty 5

## 2018-03-10 MED ORDER — PHENYLEPHRINE HCL 10 MG/ML IJ SOLN
INTRAMUSCULAR | Status: DC | PRN
  Administered 2018-03-10 (×2): 200 ug via INTRAVENOUS

## 2018-03-10 MED ORDER — LIDOCAINE 2% (20 MG/ML) 5 ML SYRINGE
INTRAMUSCULAR | Status: DC | PRN
  Administered 2018-03-10: 80 mg via INTRAVENOUS

## 2018-03-10 MED ORDER — HEPARIN BOLUS VIA INFUSION
2000.0000 [IU] | Freq: Once | INTRAVENOUS | Status: AC
  Administered 2018-03-10: 2000 [IU] via INTRAVENOUS
  Filled 2018-03-10: qty 2000

## 2018-03-10 MED ORDER — FENTANYL CITRATE (PF) 250 MCG/5ML IJ SOLN
INTRAMUSCULAR | Status: AC
  Filled 2018-03-10: qty 5

## 2018-03-10 MED ORDER — MIDAZOLAM HCL 2 MG/2ML IJ SOLN
INTRAMUSCULAR | Status: AC
  Filled 2018-03-10: qty 2

## 2018-03-10 MED ORDER — MAGNESIUM SULFATE 2 GM/50ML IV SOLN
2.0000 g | Freq: Once | INTRAVENOUS | Status: AC
  Administered 2018-03-10: 2 g via INTRAVENOUS
  Filled 2018-03-10: qty 50

## 2018-03-10 MED ORDER — OXYCODONE HCL 5 MG/5ML PO SOLN: 5.0000 mg | Freq: Once | ORAL | Status: DC | PRN

## 2018-03-10 MED ORDER — OXYCODONE HCL 5 MG PO TABS
5.0000 mg | ORAL_TABLET | ORAL | Status: DC | PRN
  Administered 2018-03-10: 5 mg via ORAL
  Filled 2018-03-10: qty 1

## 2018-03-10 MED ORDER — ONDANSETRON HCL 4 MG/2ML IJ SOLN
INTRAMUSCULAR | Status: DC | PRN
  Administered 2018-03-10: 4 mg via INTRAVENOUS

## 2018-03-10 MED ORDER — REGADENOSON 0.4 MG/5ML IV SOLN
0.4000 mg | Freq: Once | INTRAVENOUS | Status: AC
  Administered 2018-03-10: 0.4 mg via INTRAVENOUS
  Filled 2018-03-10: qty 5

## 2018-03-10 MED ORDER — NEOSTIGMINE METHYLSULFATE 3 MG/3ML IV SOSY
PREFILLED_SYRINGE | INTRAVENOUS | Status: AC
  Filled 2018-03-10: qty 3

## 2018-03-10 MED ORDER — 0.9 % SODIUM CHLORIDE (POUR BTL) OPTIME
TOPICAL | Status: DC | PRN
  Administered 2018-03-10: 1000 mL

## 2018-03-10 MED ORDER — POTASSIUM CHLORIDE 20 MEQ PO PACK
40.0000 meq | PACK | Freq: Once | ORAL | Status: AC
  Administered 2018-03-10: 40 meq via ORAL
  Filled 2018-03-10: qty 2

## 2018-03-10 MED ORDER — ACETAMINOPHEN 325 MG PO TABS
650.0000 mg | ORAL_TABLET | Freq: Four times a day (QID) | ORAL | Status: DC | PRN
  Administered 2018-03-10 – 2018-03-11 (×5): 650 mg via ORAL
  Filled 2018-03-10 (×5): qty 2

## 2018-03-10 MED ORDER — TECHNETIUM TC 99M TETROFOSMIN IV KIT
30.0000 | PACK | Freq: Once | INTRAVENOUS | Status: AC | PRN
  Administered 2018-03-10: 30 via INTRAVENOUS

## 2018-03-10 MED ORDER — OXYCODONE HCL 5 MG PO TABS
5.0000 mg | ORAL_TABLET | Freq: Once | ORAL | Status: AC
  Administered 2018-03-10: 5 mg via ORAL
  Filled 2018-03-10: qty 1

## 2018-03-10 MED ORDER — PROPOFOL 10 MG/ML IV BOLUS
INTRAVENOUS | Status: AC
  Filled 2018-03-10: qty 20

## 2018-03-10 MED ORDER — ACETAMINOPHEN 10 MG/ML IV SOLN
1000.0000 mg | Freq: Once | INTRAVENOUS | Status: AC
  Administered 2018-03-10: 1000 mg via INTRAVENOUS

## 2018-03-10 MED ORDER — LACTATED RINGERS IV SOLN
INTRAVENOUS | Status: DC
  Administered 2018-03-10: 15:00:00 via INTRAVENOUS

## 2018-03-10 MED ORDER — LIDOCAINE 2% (20 MG/ML) 5 ML SYRINGE
INTRAMUSCULAR | Status: AC
  Filled 2018-03-10: qty 10

## 2018-03-10 MED ORDER — SODIUM CHLORIDE 0.9 % IV SOLN
1.0000 g | Freq: Once | INTRAVENOUS | Status: AC
  Administered 2018-03-10: 1 g via INTRAVENOUS
  Filled 2018-03-10: qty 10

## 2018-03-10 MED ORDER — GLYCOPYRROLATE PF 0.2 MG/ML IJ SOSY
PREFILLED_SYRINGE | INTRAMUSCULAR | Status: AC
  Filled 2018-03-10: qty 2

## 2018-03-10 MED ORDER — ACETAMINOPHEN 325 MG PO TABS: 650.0000 mg | ORAL_TABLET | Freq: Four times a day (QID) | ORAL | Status: DC | PRN

## 2018-03-10 MED ORDER — ACETAMINOPHEN 500 MG PO TABS: 1000.0000 mg | ORAL_TABLET | Freq: Once | ORAL | Status: DC | PRN

## 2018-03-10 MED ORDER — SODIUM CHLORIDE 0.9 % IV SOLN
1.0000 g | INTRAVENOUS | Status: DC
  Administered 2018-03-10: 1 g via INTRAVENOUS
  Filled 2018-03-10 (×2): qty 10

## 2018-03-10 MED ORDER — ROCURONIUM BROMIDE 50 MG/5ML IV SOSY
PREFILLED_SYRINGE | INTRAVENOUS | Status: AC
  Filled 2018-03-10: qty 10

## 2018-03-10 MED ORDER — LIP MEDEX EX OINT
TOPICAL_OINTMENT | CUTANEOUS | Status: DC | PRN
  Administered 2018-03-10: 06:00:00 via TOPICAL
  Filled 2018-03-10: qty 7

## 2018-03-10 MED ORDER — FENTANYL CITRATE (PF) 100 MCG/2ML IJ SOLN
INTRAMUSCULAR | Status: DC | PRN
  Administered 2018-03-10: 50 ug via INTRAVENOUS

## 2018-03-10 MED ORDER — ACETAMINOPHEN 10 MG/ML IV SOLN
INTRAVENOUS | Status: AC
  Filled 2018-03-10: qty 100

## 2018-03-10 SURGICAL SUPPLY — 27 items
BNDG GAUZE ELAST 4 BULKY (GAUZE/BANDAGES/DRESSINGS) IMPLANT
COVER MAYO STAND STRL (DRAPES) ×2 IMPLANT
COVER SURGICAL LIGHT HANDLE (MISCELLANEOUS) ×2 IMPLANT
COVER WAND RF STERILE (DRAPES) ×2 IMPLANT
DRSG PAD ABDOMINAL 8X10 ST (GAUZE/BANDAGES/DRESSINGS) ×2 IMPLANT
ELECT CAUTERY BLADE 6.4 (BLADE) ×2 IMPLANT
ELECT REM PT RETURN 9FT ADLT (ELECTROSURGICAL) ×2
ELECTRODE REM PT RTRN 9FT ADLT (ELECTROSURGICAL) ×1 IMPLANT
GAUZE SPONGE 4X4 12PLY STRL (GAUZE/BANDAGES/DRESSINGS) ×2 IMPLANT
GLOVE BIO SURGEON STRL SZ7 (GLOVE) ×2 IMPLANT
GLOVE BIOGEL PI IND STRL 7.5 (GLOVE) ×1 IMPLANT
GLOVE BIOGEL PI INDICATOR 7.5 (GLOVE) ×1
GOWN STRL REUS W/ TWL LRG LVL3 (GOWN DISPOSABLE) ×2 IMPLANT
GOWN STRL REUS W/TWL LRG LVL3 (GOWN DISPOSABLE) ×2
KIT BASIN OR (CUSTOM PROCEDURE TRAY) ×2 IMPLANT
KIT TURNOVER KIT B (KITS) ×2 IMPLANT
NS IRRIG 1000ML POUR BTL (IV SOLUTION) ×2 IMPLANT
PACK LITHOTOMY IV (CUSTOM PROCEDURE TRAY) ×2 IMPLANT
PAD ARMBOARD 7.5X6 YLW CONV (MISCELLANEOUS) ×2 IMPLANT
PENCIL BUTTON HOLSTER BLD 10FT (ELECTRODE) ×2 IMPLANT
SPONGE LAP 18X18 X RAY DECT (DISPOSABLE) ×2 IMPLANT
SURGILUBE 2OZ TUBE FLIPTOP (MISCELLANEOUS) ×2 IMPLANT
SYR BULB 3OZ (MISCELLANEOUS) ×2 IMPLANT
TOWEL OR 17X24 6PK STRL BLUE (TOWEL DISPOSABLE) ×2 IMPLANT
TOWEL OR 17X26 10 PK STRL BLUE (TOWEL DISPOSABLE) ×2 IMPLANT
TUBE CONNECTING 12X1/4 (SUCTIONS) ×2 IMPLANT
YANKAUER SUCT BULB TIP NO VENT (SUCTIONS) ×2 IMPLANT

## 2018-03-10 NOTE — Progress Notes (Addendum)
Patient ID: [name] [name], female   DOB: 06-06-1950, 67 y.o.   MRN: [id number]  Patient is off the floor for cardiac studies. Will make her NPO  If she is cleared by cardiology for general anesthetic, will plan I&D of perirectal abscess late this afternoon.  Hold heparin if possible for surgery.  May restart a few hours after surgery.  [name]. [name], MD, [facility] Surgery  General/ Trauma Surgery Beeper [phone]  03/10/2018 8:13 AM

## 2018-03-10 NOTE — Progress Notes (Signed)
ANTICOAGULATION CONSULT NOTE - Follow Up Consult  Pharmacy Consult for heparin Indication: chest pain/ACS  Labs: Recent Labs    03/08/18 2058 03/09/18 0542 03/09/18 1544 03/09/18 2040 03/10/18 0116  HGB 10.9* 10.3*  --   --  9.6*  HCT 36.5 33.9*  --   --  31.7*  PLT 183 148*  --   --  132*  LABPROT  --  15.0  --   --   --   INR  --  1.19  --   --   --   HEPARINUNFRC  --   --   --   --  0.20*  CREATININE 1.28* 1.09*  --   --   --   TROPONINI  --   --  0.19* 0.18*  --     Assessment: 67yo female subtherapeutic on heparin with initial dosing for abnormal EKG; RN reports minor issue with IV site and gtt had to be switched to different site but states that interruption was <36min.  Goal of Therapy:  Heparin level 0.3-0.7 units/ml   Plan:  Will rebolus with heparin 2000 units and increase heparin gtt by 2 units/kg/hr to 1300 units/hr and check level in 6 hours.    Wynona Neat, PharmD, BCPS  03/10/2018,1:59 AM

## 2018-03-10 NOTE — Progress Notes (Signed)
Patient presented for Lexiscan. Tolerated procedure well. Pending final stress imaging result.  

## 2018-03-10 NOTE — Progress Notes (Addendum)
Family Medicine Teaching Service Daily Progress Note Intern Pager: [phone]  Patient name: [name] [name] Medical record number: [id number] Date of birth: 08/21/1950 Age: 67 y.o. Gender: female  Primary Care Provider: [name], DO Consultants: Surgery, PT/OT, Cards Code Status: Full  Pt Overview and Major Events to Date:  7/19 - admitted for sepsis secondary to perianal abscess  Assessment and Plan: [name] is a 67 year old female presenting with generalized weakness and fatigue found to have sepsis due to perianal abscess.  PMH significant for T2DM diabetes with gastroparesis and peripheral neuropathy, HTN, CAD with stable angina, HFpEF, hyperlipidemia, major depression with psychotic features, anxiety, and fibromyalgia.  Sepsis secondary to perirectal abscess: T-max overnight 102.1 F, reduced to 99.6 F this a.m. 11/19.  Blood pressure stable this a.m. 96/40.  Heart rate elevated between 104 and 115 in setting of sepsis, dehydration, and pain. WBC count improved from 17.7 > 14.7 > 13.3 11/20.  Lactic acid 3.42 > 0.92 11/19 s/p IV fluids. -General surgery consulted - will perform I&D 11/20 due to cardiac clearance. Heparin held. Can restart heparin a few hours after surgery. -IV vancomycin and ceftriaxone -Erythromycin tablet 250 mg 3 times daily with meals and bedtime -mIVF NS @ 100cc/hr -Tylenol PRN fever and pain -Zofran PRN nausea/vomiting -Blood cultures collected and pending - inadequate volume collected, NG@24hrs   EKG Changes: EKG showing ST segment depressions and inverted T waves were noticed. Troponins 0.19>0.18>0.18 ON.  -Cardiology consulted: Will obtain coronary CTA 11/20 - Appreciate recs -Trend troponins -Sublingual nitroglycerin -Repeat EKG  Bacteruria: UA 11/20 showing small leuk with many bacteria, no nitrites. -Continue Vanc and CTX  Hypokalemia: Patient presenting with K 3.2 on admission and again 11/19.  Takes HCTZ at home. Given K in IV  fluids. -Monitor BMP -Replete with K-Dur 40 mEq twice daily  Hypomagnesemia: Magnesium 1.5. -Replete with 2 g magnesium sulfate  AKI: Creatinine 1.28 on admission reduced to 1.09 >1.04 on 11/20. Baseline 0.8. -Monitor BMP -mIVFs -Avoid nephrotoxic medications  Low Magnesium: low at 1.5. -Replete   Dysuria: She reports 1 week history of dysuria, urinary frequency, and urgency with suprapubic tenderness. -Obtain UA -Continue ceftriaxone as above, will additionally cover potential UTI.  Hypertension, chronic, stable: BP 96/40 11/20.  Heart rate elevated to 104-115, potentially in setting of dehydration, sepsis, and pain. Patient takes losartan 100 mg, amlodipine 10 mg, Coreg 25 mg twice daily, and spironolactone 25 mg daily for control -Hold home spironolactone and losartan given AKI -Continue carvedilol 25 mg twice daily with meals -Continue amlodipine 10 mg daily -Monitor BP  T2DM with gastroparesis and peripheral neuropathy, chronic, poorly controlled: A1c 7.2 % 11/2017.  CBG 232 on admit, 182 on 11/19.  Patient prescribed Lantus 40 mg twice daily and NovoLog 15 units up to twice daily with meals. -Obtain A1c -mSSI -Lantus 20 units BID -CBGs ACHS  Depression with psychotic features: -Hydroxyzine 25 mg 3 times daily as needed anxiety, itching  Tobacco use disorder: -Nicotine patch 14 mg  Allergies: -Claritin 10mg  daily -Hydroxyzine 25 mg 3 times daily as needed anxiety, itching -Flonase -Prednisolone eyedrops twice daily as needed itching  FEN/GI: IV NS 100cc/hr, n.p.o., advance diet as tolerated, Protonix PPx: SCDs  Disposition: Discharge home when medically cleared  Subjective:  Patient was seen in the PACU following long day of procedures. She was very somnolent and difficult to awaken. She could answer yes and no questions. Vitals were stable, she was minimally cooperative on physical exam 2/2 sleepiness.  Objective: Temp:  [97.9 F (  36.6 C)-103.8 F  (39.9 C)] 100.6 F (38.1 C) (11/20 0659) Pulse Rate:  [71-113] 101 (11/20 0409) Resp:  [12-41] 27 (11/20 0409) BP: (90-168)/(38-75) 104/40 (11/20 0908) SpO2:  [88 %-100 %] 98 % (11/20 0409) Weight:  [95 kg] 95 kg (11/20 0453)  Physical Exam  Constitutional: She appears well-developed and well-nourished.  Somnolent, recovering from surgery  Cardiovascular: Normal rate, regular rhythm, normal heart sounds and intact distal pulses.  Pulmonary/Chest: Effort normal and breath sounds normal.  Abdominal: Soft. Bowel sounds are normal. She exhibits no distension.  Obese abdomen  Musculoskeletal: She exhibits no edema.  SCDs in place to bilateral LEs.  Skin: Skin is warm. Capillary refill takes less than 2 seconds.   Laboratory: Recent Labs  Lab 03/08/18 2058 03/09/18 0542 03/10/18 0116  WBC 17.7* 14.7* 13.3*  HGB 10.9* 10.3* 9.6*  HCT 36.5 33.9* 31.7*  PLT 183 148* 132*   Recent Labs  Lab 03/08/18 2058 03/09/18 0542 03/10/18 0116  NA 135 136 137  K 3.2* 3.2* 3.4*  CL 101 104 107  CO2 22 24 23   BUN 13 13 12   CREATININE 1.28* 1.09* 1.04*  CALCIUM 8.4* 7.7* 7.6*  PROT  --  6.5 6.3*  BILITOT  --  0.7 0.6  ALKPHOS  --  56 57  ALT  --  10 11  AST  --  16 21  GLUCOSE 232* 182* 125*   Imaging/Diagnostic Tests: Ct Abdomen Pelvis W Contrast  Result Date: 03/09/2018 CLINICAL DATA:  Generalize weakness, nausea, and diarrhea for 3 days. History of abscess on the buttocks. EXAM: CT ABDOMEN AND PELVIS WITH CONTRAST TECHNIQUE: Multidetector CT imaging of the abdomen and pelvis was performed using the standard protocol following bolus administration of intravenous contrast. CONTRAST:  140mL ISOVUE-300 IOPAMIDOL (ISOVUE-300) INJECTION 61% COMPARISON:  MRI abdomen 01/17/2016. CT abdomen and pelvis 01/14/2016. FINDINGS: Lower chest: Dependent changes in the lung bases. Small esophageal hiatal hernia. Hepatobiliary: Diffuse fatty infiltration of the liver. No focal liver abnormality is seen.  Status post cholecystectomy. No biliary dilatation. Pancreas: Unremarkable. No pancreatic ductal dilatation or surrounding inflammatory changes. Spleen: Normal in size without focal abnormality. Adrenals/Urinary Tract: Adrenal glands are unremarkable. Kidneys are normal, without renal calculi, focal lesion, or hydronephrosis. Bladder is unremarkable. Stomach/Bowel: Stomach, small bowel, and colon are not abnormally distended. No wall thickening is appreciated. Gas and fluid collection in the right perirectal space measuring up to about 2.1 cm diameter and extending to just above the levator ani muscles. Prominent soft tissue gas demonstrated in the perianal region and extending inferiorly to the perineal fat bilaterally. This could be due to bowel fistula or infection from gas-forming organism. Vascular/Lymphatic: Aortic atherosclerosis. No enlarged abdominal or pelvic lymph nodes. Reproductive: Status post hysterectomy. No adnexal masses. Other: No free air or free fluid in the abdomen. Anterior abdominal wall hernia near the umbilicus containing fat. Musculoskeletal: No acute or significant osseous findings. IMPRESSION: 1. Right perianal abscess extending to just above the levator ani muscles. Prominent soft tissue gas in the perianal region and extending inferiorly to the perineal fat bilaterally. This could be due to bowel fistula or soft tissue infection from gas-forming organism. 2. Diffuse fatty infiltration of the liver. 3. Small esophageal hiatal hernia. Small anterior abdominal wall hernia containing fat. Aortic Atherosclerosis (ICD10-I70.0). Electronically Signed   By: Lucienne Capers M.D.   On: 03/09/2018 01:04     Daisy Floro, DO 03/10/2018, 9:13 AM PGY-1, [place] Intern pager: [phone], text pages  welcome

## 2018-03-10 NOTE — Transfer of Care (Signed)
Immediate Anesthesia Transfer of Care Note  Patient: [name] [name]  Procedure(s) Performed: IRRIGATION AND DRAINAGE  PERIRECTAL ABSCESS (N/A Rectum)  Patient Location: PACU  Anesthesia Type:General  Level of Consciousness: sedated  Airway & Oxygen Therapy: Patient Spontanous Breathing, Patient connected to nasal cannula oxygen and oral airway  Post-op Assessment: Report given to RN and Post -op Vital signs reviewed and stable  Post vital signs: Reviewed and stable  Last Vitals:  Vitals Value Taken Time  BP 91/54 03/10/2018  4:05 PM  Temp    Pulse 84 03/10/2018  4:08 PM  Resp 18 03/10/2018  4:08 PM  SpO2 96 % 03/10/2018  4:08 PM  Vitals shown include unvalidated device data.  Last Pain:  Vitals:   03/10/18 1605  TempSrc:   PainSc: (P) Asleep         Complications: No apparent anesthesia complications

## 2018-03-10 NOTE — Anesthesia Preprocedure Evaluation (Signed)
Anesthesia Evaluation  Patient identified by MRN, date of birth, ID band Patient awake    Reviewed: Allergy & Precautions, NPO status , Patient's Chart, lab work & pertinent test results  History of Anesthesia Complications Negative for: history of anesthetic complications  Airway Mallampati: III  TM Distance: >3 FB Neck ROM: Full    Dental  (+) Edentulous Upper, Missing, Poor Dentition   Pulmonary asthma , Current Smoker,    breath sounds clear to auscultation       Cardiovascular hypertension, + angina + CAD and +CHF   Rhythm:Regular     Neuro/Psych  Headaches, PSYCHIATRIC DISORDERS Anxiety Depression Schizophrenia  Neuromuscular disease CVA    GI/Hepatic GERD  Controlled,  Endo/Other  diabetes, Type 2Morbid obesity  Renal/GU CRFRenal disease     Musculoskeletal  (+) Arthritis , Fibromyalgia -  Abdominal   Peds  Hematology  (+) anemia ,   Anesthesia Other Findings Seen by Cardiology and cleared   2016 tte: - Left ventricle: The cavity size was normal. There was moderate concentric hypertrophy. Systolic function was vigorous. The estimated ejection fraction was in the range of 65% to 70%. Wall motion was normal; there were no regional wall motion abnormalities. Features are consistent with a pseudonormal left ventricular filling pattern, with concomitant abnormal relaxation and increased filling pressure (grade 2 diastolic dysfunction). - Mitral valve: Calcified annulus. - Left atrium: The atrium was mildly dilated. - Pericardium, extracardiac: A trivial pericardial effusion was identified posterior to the heart.  Reproductive/Obstetrics                             Anesthesia Physical Anesthesia Plan  ASA: IV  Anesthesia Plan: General   Post-op Pain Management:    Induction: Intravenous  PONV Risk Score and Plan: 2 and Ondansetron and Dexamethasone  Airway  Management Planned: LMA and Oral ETT  Additional Equipment: None  Intra-op Plan:   Post-operative Plan: Extubation in OR  Informed Consent: I have reviewed the patients History and Physical, chart, labs and discussed the procedure including the risks, benefits and alternatives for the proposed anesthesia with the patient or authorized representative who has indicated his/her understanding and acceptance.   Dental advisory given  Plan Discussed with: CRNA and Surgeon  Anesthesia Plan Comments:         Anesthesia Quick Evaluation

## 2018-03-10 NOTE — Progress Notes (Signed)
OT Cancellation Note  Patient Details Name: Ramiya Delahunty MRN: [id number] DOB: [birth year]   Cancelled Treatment:    Reason Eval/Treat Not Completed: Patient at procedure or test/ unavailable(pending surgery this afternoon)  Richelle Ito, OTR/L  Acute Rehabilitation Services Pager: [phone] Office: [phone] .  03/10/2018, 1:52 PM

## 2018-03-10 NOTE — Anesthesia Postprocedure Evaluation (Signed)
Anesthesia Post Note  Patient: [name] [name]  Procedure(s) Performed: IRRIGATION AND DRAINAGE  PERIRECTAL ABSCESS (N/A Rectum)     Patient location during evaluation: PACU Anesthesia Type: General Level of consciousness: responds to stimulation Pain management: pain level controlled Vital Signs Assessment: post-procedure vital signs reviewed and stable Respiratory status: spontaneous breathing, nonlabored ventilation, respiratory function stable and patient connected to nasal cannula oxygen Cardiovascular status: blood pressure returned to baseline and stable Postop Assessment: no apparent nausea or vomiting Anesthetic complications: no    Last Vitals:  Vitals:   03/10/18 1914 03/10/18 2043  BP:  (!) 115/45  Pulse:  95  Resp:  (!) 32  Temp: (!) 38.5 C (!) 38 C  SpO2:  97%    Last Pain:  Vitals:   03/10/18 2043  TempSrc: Oral  PainSc:                  Karyl Kinnier Ellender

## 2018-03-10 NOTE — Progress Notes (Signed)
Heparin gtt stopped per Robbie Lis, PA

## 2018-03-10 NOTE — Progress Notes (Signed)
Patient has not voided since arrival to unit.  Bladder scan completed showing greater than 923mL.  RN paged Family Medicine Teaching Service with this information.  On call for Teaching Service returned page; order received for in and out cath.  In and out cath completed; 1045mL of urine drained from bladder.  RN also updated Teaching Service on call about patient not tolerating IV potassium and patient had received 2.5 out of 5 ordered doses.

## 2018-03-10 NOTE — Progress Notes (Signed)
ANTICOAGULATION CONSULT NOTE - Follow Up Consult  Pharmacy Consult for heparin Indication: chest pain/ACS  Labs: Recent Labs    03/08/18 2058 03/09/18 0542 03/09/18 1544 03/09/18 2040 03/10/18 0116  HGB 10.9* 10.3*  --   --  9.6*  HCT 36.5 33.9*  --   --  31.7*  PLT 183 148*  --   --  132*  LABPROT  --  15.0  --   --   --   INR  --  1.19  --   --   --   HEPARINUNFRC  --   --   --   --  0.20*  CREATININE 1.28* 1.09*  --   --  1.04*  TROPONINI  --   --  0.19* 0.18* 0.18*    Assessment: 67yo female started on heparin for abnormal EKG. Heparin now being held d/t surgery later today. Will follow up tomorrow on plans for heparin restart.    Goal of Therapy:  Heparin level 0.3-0.7 units/ml   Plan:  Hold heparin for surgery  Azzie Roup D PGY1 Pharmacy Resident  Phone [phone] Please use [facility] for clinical pharmacists numbers  03/10/2018      11:50 AM

## 2018-03-10 NOTE — Anesthesia Procedure Notes (Signed)
Procedure Name: LMA Insertion Date/Time: 03/10/2018 3:15 PM Performed by: Leonor Liv, CRNA Pre-anesthesia Checklist: Patient identified, Emergency Drugs available, Suction available and Patient being monitored Patient Re-evaluated:Patient Re-evaluated prior to induction Oxygen Delivery Method: Circle System Utilized Preoxygenation: Pre-oxygenation with 100% oxygen Induction Type: IV induction Ventilation: Mask ventilation without difficulty LMA: LMA inserted LMA Size: 4.0 Number of attempts: 1 Placement Confirmation: positive ETCO2 Tube secured with: Tape Dental Injury: Teeth and Oropharynx as per pre-operative assessment

## 2018-03-10 NOTE — Op Note (Signed)
Preop diagnosis: Right perirectal abscess Postop diagnosis: Same Procedure performed: Incision and drainage of right perirectal abscess (3 x 8 cm) Surgeon:Fallynn Gravett K Elaf Clauson Anesthesia: General via LMA Indications: This is a 67 year old female with diabetes who presents with several weeks of worsening pain to the right side of her anus.  She has developed swelling in this area.  She presented to the emergency department with signs of sepsis.  CT scan showed a perirectal abscess.  We attempted to bring into the operating room yesterday but prior to induction of general anesthesia, she developed EKG changes.  She has been evaluated by cardiology and is now cleared for surgery.  Description of procedure: The patient is brought to the operating room placed in the supine position on the operating room table.  After an adequate level of general anesthesia was obtained, her legs were placed in lithotomy position in yellowfin stirrups.  Her perineum was prepped with Betadine and draped in sterile fashion.  A timeout was taken to ensure the proper patient and proper procedure.  The patient has an area of firm fluctuance just to the right of her anus.  We made our circular incision in this area 2 cm in diameter.  We encountered some foul-smelling purulent fluid.  This was sent for culture.  We open the incision.  It seems to tunnel superiorly for distance of about 8 cm.  We irrigated the wound thoroughly.  1/2 inch Penrose drain was then inserted deep into the incision.  This was secured with 2 separate 2-0 Ethilon sutures.  The superficial part of the wound was packed with saline moistened gauze.  A dry dressing was applied.  Patient was then extubated and brought to the recovery room in stable condition.  All sponge, instrument, and needle counts are correct.  [name]. [name], MD, [facility] [phone]  03/10/2018 3:56 PM

## 2018-03-10 NOTE — Progress Notes (Signed)
PT Cancellation Note  Patient Details Name: Rashia Mckesson MRN: [id number] DOB: 12-27-1950   Cancelled Treatment:    Reason Eval/Treat Not Completed: Patient at procedure or test/unavailable (off floor for I&D). Will follow-up for PT evaluation.  Mabeline Caras, PT, DPT Acute Rehabilitation Services  Pager [phone] Office [place] 03/10/2018, 2:31 PM

## 2018-03-10 NOTE — Progress Notes (Addendum)
Progress Note  Patient Name: [name] [name] Date of Encounter: 03/10/2018  Primary Cardiologist: [name] Casino, MD   Subjective   Still intermittent chest pain which reproducible with palpation.   Inpatient Medications    Scheduled Meds: . amLODipine  10 mg Oral Daily  . carvedilol  25 mg Oral BID WC  . fluticasone  2 spray Each Nare Daily  . insulin aspart  0-15 Units Subcutaneous TID WC  . insulin glargine  20 Units Subcutaneous BID  . loratadine  10 mg Oral Daily  . nicotine  14 mg Transdermal Daily  . pantoprazole (PROTONIX) IV  40 mg Intravenous Q24H  . pregabalin  150 mg Oral TID  . QUEtiapine  25 mg Oral QHS  . regadenoson  0.4 mg Intravenous Once  . rosuvastatin  20 mg Oral q1800   Continuous Infusions: . heparin Stopped (03/10/18 0930)  . lactated ringers 10 mL/hr at 03/09/18 0826  . vancomycin 1,250 mg (03/10/18 0211)   PRN Meds: acetaminophen **OR** acetaminophen, albuterol, hydrOXYzine, lip balm, nitroGLYCERIN, ondansetron (ZOFRAN) IV, polyvinyl alcohol, prednisoLONE acetate   Vital Signs    Vitals:   03/10/18 0908 03/10/18 0915 03/10/18 0917 03/10/18 0918  BP: (!) 104/40 (!) 101/37 (!) 90/36 (!) 96/40  Pulse:      Resp:      Temp:      TempSrc:      SpO2:      Weight:      Height:        Intake/Output Summary (Last 24 hours) at 03/10/2018 0954 Last data filed at 03/10/2018 0700 Gross per 24 hour  Intake 2014.71 ml  Output 1350 ml  Net 664.71 ml   Filed Weights   03/09/18 0835 03/10/18 0453  Weight: 92 kg 95 kg    Telemetry    Unable to review as patient seen in nuc med; sinus rhythm rates in the 90s  ECG    N/A  Physical Exam   GEN: No acute distress.   Neck: No JVD Cardiac: RRR, no murmurs, rubs, or gallops.  Respiratory: Clear to auscultation bilaterally. GI: Soft, nontender, non-distended  MS: No edema; No deformity.  She does have some tenderness to palpation mostly along the right side of the sternal border. Neuro:   Nonfocal  Psych: Normal affect   Labs    Chemistry Recent Labs  Lab 03/08/18 2058 03/09/18 0542 03/10/18 0116  NA 135 136 137  K 3.2* 3.2* 3.4*  CL 101 104 107  CO2 22 24 23   GLUCOSE 232* 182* 125*  BUN 13 13 12   CREATININE 1.28* 1.09* 1.04*  CALCIUM 8.4* 7.7* 7.6*  PROT  --  6.5 6.3*  ALBUMIN  --  2.9* 2.5*  AST  --  16 21  ALT  --  10 11  ALKPHOS  --  56 57  BILITOT  --  0.7 0.6  GFRNONAA 42* 51* 54*  GFRAA 48* 59* >60  ANIONGAP 12 8 7      Hematology Recent Labs  Lab 03/08/18 2058 03/09/18 0542 03/10/18 0116  WBC 17.7* 14.7* 13.3*  RBC 4.03 3.75* 3.50*  HGB 10.9* 10.3* 9.6*  HCT 36.5 33.9* 31.7*  MCV 90.6 90.4 90.6  MCH 27.0 27.5 27.4  MCHC 29.9* 30.4 30.3  RDW 13.4 13.6 13.7  PLT 183 148* 132*    Cardiac Enzymes Recent Labs  Lab 03/09/18 1544 03/09/18 2040 03/10/18 0116  TROPONINI 0.19* 0.18* 0.18*   No results for input(s): TROPIPOC in the last  168 hours.   BNPNo results for input(s): BNP, PROBNP in the last 168 hours.   DDimer No results for input(s): DDIMER in the last 168 hours.   Radiology    Ct Abdomen Pelvis W Contrast  Result Date: 03/09/2018 CLINICAL DATA:  Generalize weakness, nausea, and diarrhea for 3 days. History of abscess on the buttocks. EXAM: CT ABDOMEN AND PELVIS WITH CONTRAST TECHNIQUE: Multidetector CT imaging of the abdomen and pelvis was performed using the standard protocol following bolus administration of intravenous contrast. CONTRAST:  199mL ISOVUE-300 IOPAMIDOL (ISOVUE-300) INJECTION 61% COMPARISON:  MRI abdomen 01/17/2016. CT abdomen and pelvis 01/14/2016. FINDINGS: Lower chest: Dependent changes in the lung bases. Small esophageal hiatal hernia. Hepatobiliary: Diffuse fatty infiltration of the liver. No focal liver abnormality is seen. Status post cholecystectomy. No biliary dilatation. Pancreas: Unremarkable. No pancreatic ductal dilatation or surrounding inflammatory changes. Spleen: Normal in size without focal  abnormality. Adrenals/Urinary Tract: Adrenal glands are unremarkable. Kidneys are normal, without renal calculi, focal lesion, or hydronephrosis. Bladder is unremarkable. Stomach/Bowel: Stomach, small bowel, and colon are not abnormally distended. No wall thickening is appreciated. Gas and fluid collection in the right perirectal space measuring up to about 2.1 cm diameter and extending to just above the levator ani muscles. Prominent soft tissue gas demonstrated in the perianal region and extending inferiorly to the perineal fat bilaterally. This could be due to bowel fistula or infection from gas-forming organism. Vascular/Lymphatic: Aortic atherosclerosis. No enlarged abdominal or pelvic lymph nodes. Reproductive: Status post hysterectomy. No adnexal masses. Other: No free air or free fluid in the abdomen. Anterior abdominal wall hernia near the umbilicus containing fat. Musculoskeletal: No acute or significant osseous findings. IMPRESSION: 1. Right perianal abscess extending to just above the levator ani muscles. Prominent soft tissue gas in the perianal region and extending inferiorly to the perineal fat bilaterally. This could be due to bowel fistula or soft tissue infection from gas-forming organism. 2. Diffuse fatty infiltration of the liver. 3. Small esophageal hiatal hernia. Small anterior abdominal wall hernia containing fat. Aortic Atherosclerosis (ICD10-I70.0). Electronically Signed   By: Lucienne Capers M.D.   On: 03/09/2018 01:04    Cardiac Studies   Myoview Stress Test: LOW RISK.  EF 55-60%. --On my personal review, there does appear to be a mostly fixed inferior inferolateral defect that would have suggested either prior infarct versus artifact.  Not commented on by the reading physician which would suggest that there is adequate wall motion in this region.  This would then suggest  artifact and not infarct.   Patient Profile     [name] [name] is a 67 y.o. female with a hx of DM2 with  gastroparesis and peripheral neuropathy, HTN, CAD with stable angina, HFpEF, HLD, major depression with psychotic features, anxiety and fibromyalgia seen for chest pain and surgical clearance.   Assessment & Plan    1. Abnormal EKG 2. Chest pain 3. Elevated troponin 4. Surgical clearance 5. Hypertension 6. AKI 7. Hypokalemia 8. Hypomagnesia   Troponin flat trend. Pain reproducible with palpation. Abnormal EKG but similar to 2017.  Plan for I & D later today per note. Heparin stopped at 9:30am after discussion with Dr. Ellyn Hack. Correct electrolytes abnormality. Pending final imaging of stress test prior to clearance.      Signed, Leanor Kail, PA  03/10/2018, 9:54 AM    I have seen, examined and evaluated the patient this morning along with Mr. Curly [place], [place].  After reviewing all the available data and chart, we discussed the  patients laboratory, study & physical findings as well as symptoms in detail. I agree with his findings, examination as well as impression recommendations as per our discussion.    Attending adjustments noted in italics.   She still has chest pain.  Reviewed nuclear images, reading physician read as low risk with no reversible defects to suggest any ischemia. There did appear to be a fixed defect that was not commented on, likely thought to be artifact. EF was normal with no wall motion normalities which would argue that any defect that is fixed would be artifact and not infarct.  Mild troponin elevation is probably related to demand ischemia in the setting of her perirectal abscess related sepsis.  The trend is flat and not typical for ACS. Her EKG did look abnormal and it could very well be lead placement but it is more consistent with LVH and repolarization of normality.  Similar findings were seen on previous EKGs.  Based on the results of the nuclear stress test, would suggest that she would be low risk for a low risk surgery.  She is already on high doses  of carvedilol and amlodipine which are both cardioprotective for surgery.  Would recommend that she is okay for going to surgery this afternoon.  We will be available if there are any issues postop. We will also be planning to schedule her to follow-up with her cardiologist posthospitalization to review results and symptoms etc.  We will follow along postop day 1.    Glenetta Hew, M.D., M.S. Interventional Cardiologist   Pager # [phone] Phone # [phone] [address] [place], [place] [address]        For questions or updates, please contact Home Please consult [email or web address] for contact info under

## 2018-03-11 ENCOUNTER — Encounter (HOSPITAL_COMMUNITY): Payer: Self-pay | Admitting: Surgery

## 2018-03-11 ENCOUNTER — Inpatient Hospital Stay (HOSPITAL_COMMUNITY): Payer: Medicare Other

## 2018-03-11 DIAGNOSIS — R0789 Other chest pain: Secondary | ICD-10-CM

## 2018-03-11 DIAGNOSIS — R6520 Severe sepsis without septic shock: Secondary | ICD-10-CM

## 2018-03-11 DIAGNOSIS — A419 Sepsis, unspecified organism: Principal | ICD-10-CM

## 2018-03-11 DIAGNOSIS — R7989 Other specified abnormal findings of blood chemistry: Secondary | ICD-10-CM

## 2018-03-11 DIAGNOSIS — G9340 Encephalopathy, unspecified: Secondary | ICD-10-CM

## 2018-03-11 DIAGNOSIS — D649 Anemia, unspecified: Secondary | ICD-10-CM

## 2018-03-11 LAB — BLOOD GAS, ARTERIAL
Acid-base deficit: 0.9 mmol/L (ref 0.0–2.0)
Bicarbonate: 23.3 mmol/L (ref 20.0–28.0)
FIO2: 28
O2 Saturation: 95.6 %
Patient temperature: 104
pCO2 arterial: 44.8 mmHg (ref 32.0–48.0)
pH, Arterial: 7.353 (ref 7.350–7.450)
pO2, Arterial: 99.7 mmHg (ref 83.0–108.0)

## 2018-03-11 LAB — BASIC METABOLIC PANEL
Anion gap: 8 (ref 5–15)
BUN: 14 mg/dL (ref 8–23)
CO2: 22 mmol/L (ref 22–32)
Calcium: 7.1 mg/dL — ABNORMAL LOW (ref 8.9–10.3)
Chloride: 107 mmol/L (ref 98–111)
Creatinine, Ser: 1.1 mg/dL — ABNORMAL HIGH (ref 0.44–1.00)
GFR calc Af Amer: 58 mL/min — ABNORMAL LOW (ref >60)
GFR calc non Af Amer: 50 mL/min — ABNORMAL LOW (ref >60)
Glucose, Bld: 149 mg/dL — ABNORMAL HIGH (ref 70–99)
Potassium: 3.8 mmol/L (ref 3.5–5.1)
Sodium: 137 mmol/L (ref 135–145)

## 2018-03-11 LAB — CBC
HCT: 30.5 % — ABNORMAL LOW (ref 36.0–46.0)
Hemoglobin: 9 g/dL — ABNORMAL LOW (ref 12.0–15.0)
MCH: 27.4 pg (ref 26.0–34.0)
MCHC: 29.5 g/dL — ABNORMAL LOW (ref 30.0–36.0)
MCV: 93 fL (ref 80.0–100.0)
Platelets: 146 10*3/uL — ABNORMAL LOW (ref 150–400)
RBC: 3.28 MIL/uL — ABNORMAL LOW (ref 3.87–5.11)
RDW: 14.5 % (ref 11.5–15.5)
WBC: 11.8 10*3/uL — ABNORMAL HIGH (ref 4.0–10.5)
nRBC: 0 % (ref 0.0–0.2)

## 2018-03-11 LAB — LACTIC ACID, PLASMA
Lactic Acid, Venous: 1.4 mmol/L (ref 0.5–1.9)
Lactic Acid, Venous: 0.9 mmol/L (ref 0.5–1.9)

## 2018-03-11 LAB — GLUCOSE, CAPILLARY
Glucose-Capillary: 157 mg/dL — ABNORMAL HIGH (ref 70–99)
Glucose-Capillary: 144 mg/dL — ABNORMAL HIGH (ref 70–99)
Glucose-Capillary: 176 mg/dL — ABNORMAL HIGH (ref 70–99)
Glucose-Capillary: 172 mg/dL — ABNORMAL HIGH (ref 70–99)
Glucose-Capillary: 107 mg/dL — ABNORMAL HIGH (ref 70–99)

## 2018-03-11 LAB — MAGNESIUM: Magnesium: 1.7 mg/dL (ref 1.7–2.4)

## 2018-03-11 MED ORDER — NALOXONE HCL 0.4 MG/ML IJ SOLN
0.2000 mg | Freq: Once | INTRAMUSCULAR | Status: AC
  Administered 2018-03-11: 0.2 mg via INTRAVENOUS

## 2018-03-11 MED ORDER — ACETAMINOPHEN 325 MG PO TABS
650.0000 mg | ORAL_TABLET | ORAL | Status: DC | PRN
  Administered 2018-03-12 – 2018-03-15 (×6): 650 mg via ORAL
  Filled 2018-03-11 (×7): qty 2

## 2018-03-11 MED ORDER — PREGABALIN 75 MG PO CAPS: 150.0000 mg | ORAL_CAPSULE | Freq: Three times a day (TID) | ORAL | Status: DC | PRN

## 2018-03-11 MED ORDER — NALOXONE HCL 0.4 MG/ML IJ SOLN
INTRAMUSCULAR | Status: AC
  Administered 2018-03-11: 0.2 mg via INTRAVENOUS
  Filled 2018-03-11: qty 1

## 2018-03-11 MED ORDER — SODIUM CHLORIDE 0.9 % IV SOLN
INTRAVENOUS | Status: DC | PRN
  Administered 2018-03-11: 20 mL via INTRAVENOUS
  Administered 2018-03-11: 200 mL via INTRAVENOUS

## 2018-03-11 MED ORDER — VANCOMYCIN HCL IN DEXTROSE 750-5 MG/150ML-% IV SOLN
750.0000 mg | Freq: Two times a day (BID) | INTRAVENOUS | Status: DC
  Administered 2018-03-11 – 2018-03-13 (×4): 750 mg via INTRAVENOUS
  Filled 2018-03-11 (×6): qty 150

## 2018-03-11 MED ORDER — PIPERACILLIN-TAZOBACTAM 3.375 G IVPB
3.3750 g | Freq: Three times a day (TID) | INTRAVENOUS | Status: DC
  Administered 2018-03-11 – 2018-03-15 (×12): 3.375 g via INTRAVENOUS
  Filled 2018-03-11 (×14): qty 50

## 2018-03-11 MED ORDER — DOCUSATE SODIUM 100 MG PO CAPS
100.0000 mg | ORAL_CAPSULE | Freq: Two times a day (BID) | ORAL | Status: DC
  Administered 2018-03-11 – 2018-03-15 (×5): 100 mg via ORAL
  Filled 2018-03-11 (×6): qty 1

## 2018-03-11 MED ORDER — ACETAMINOPHEN 650 MG RE SUPP
650.0000 mg | RECTAL | Status: DC | PRN
  Administered 2018-03-12: 650 mg via RECTAL
  Filled 2018-03-11: qty 1

## 2018-03-11 MED ORDER — OXYCODONE HCL 5 MG PO TABS
2.5000 mg | ORAL_TABLET | ORAL | Status: DC | PRN
  Administered 2018-03-12 (×2): 2.5 mg via ORAL
  Filled 2018-03-11 (×3): qty 1

## 2018-03-11 MED ORDER — POLYETHYLENE GLYCOL 3350 17 G PO PACK: 17.0000 g | PACK | Freq: Every day | ORAL | Status: DC | PRN

## 2018-03-11 MED ORDER — ACETAMINOPHEN 650 MG RE SUPP
325.0000 mg | Freq: Once | RECTAL | Status: AC
  Administered 2018-03-11: 325 mg via RECTAL

## 2018-03-11 NOTE — Consult Note (Addendum)
WOC consult requested for perirectal abscess site. Surgical team is following for assessment and plan of care and performed I&D yesterday and removed packing to assess wound today, according to progress notes.  Please refer to their team for further questions regarding plan of care.  Discussed with primary team via phone call. Please re-consult if further assistance is needed.  Thank-you,  Julien Girt MSN, [place], [place], [place], [place]

## 2018-03-11 NOTE — Progress Notes (Signed)
OT Cancellation Note  Patient Details Name: [name] [name] MRN: [id number] DOB: [birth year]   Cancelled Treatment:    Reason Eval/Treat Not Completed: Patient not medically ready; spoke with RN, pt with continued spike in fevers and lethargic this afternoon. Will follow up for OT eval as schedule permits and as pt is able to participate.   Lou Cal, OT [facility] Services Pager [phone] Office [phone]   Raymondo Band 03/11/2018, 3:21 PM

## 2018-03-11 NOTE — Progress Notes (Signed)
Family Medicine Teaching Service Daily Progress Note Intern Pager: [phone]  Patient name: [name] [name] Medical record number: [id number] Date of birth: June 21, 1950 Age: 67 y.o. Gender: female  Primary Care Provider: [name], DO Consultants:Surgery, PT/OT, Cards Code Status:Full  Pt Overview and Major Events to Date: 7/19 - admitted for sepsis secondary to perianal abscess  Assessment and Plan:[name] [name] is a 67 year old female presenting with generalized weakness and fatigue found to have sepsis due to perianal abscess. PMHsignificant for T2DMdiabetes with gastroparesis and peripheral neuropathy, HTN, CAD with stable angina,HFpEF, hyperlipidemia, major depression with psychotic features, anxiety,andfibromyalgia.  Sepsis secondary to perirectal abscess: T-max overnight 104 F > 26F 11/21.Blood pressure stable this a.m. 105/44 11/21. Heart rate ~88. WBCcount improved from 17.7>14.7> 13.3> 11.8 11/21. Lactic acid 3.42>0.92>1.4 11/21. -General surgery consulted- I&D performed, abscess drained and irrigated, fluid collected for culture, growing mod gram - and + rods, few gram + cocci, culture pending. Can restart heparin few hours after surgery.  -IV Vancomycin and Zosyn -mIVF NS @ 100cc/hr -Tylenol PRN fever and pain -Zofran PRN nausea/vomiting -Blood cultures collected and pending - inadequate volume collected, NG@24hrs   Altered mental status: Patient has been experiencing intermittent fatigue and somnolence since her admission. Patient restarted on Seroquel, oxycodone and lyrica last night around 9 PM 11/20. At 1 AM 11/21 when she was noted to be difficult to arouse.  She did not respond to 0.2 mg Narcan. Was given Tylenol for fever. Glucose at the time 157. ABG at the time within normal limits. -Monitor mental status. -Stop Seroquel and Lyrica. -Oxycodone for pain reduced 5 to 2.5  EKG Changes:EKG showing ST segment depressions and inverted T waves were  noticed. Troponins 0.19>0.18>0.18. -Cardiology consulted:Will obtain coronary CTA 11/20 -Appreciate recs -Trend troponins -Sublingual nitroglycerin -Repeat EKG  Bacteruria: UA 11/20 showing small leuk with many bacteria, no nitrites. No culture taken. -Continue Vanc and Zosyn  Hypokalemia:Patient presenting with K3.2 > 3.8 11/21.Takes HCTZ at home. Given K inIV fluids. -Monitor BMP -Replete with K-Dur 40 mEq twice daily  Hypomagnesemia: Magnesium 1.5. -Replete with 2 g magnesium sulfate  AKI, stable: Creatinine 1.28 on admission reduced to 1.09 >1.04>1.1 on 11/21. Baseline 0.8. -Monitor BMP -mIVFs -Avoid nephrotoxic medications  Low Magnesium: low at 1.5>1.7 11/21. -Monitor  Hypertension, chronic, stable:BP 105/44 11/21.Patient takes losartan 100 mg, amlodipine 10 mg, Coreg 25 mg twice daily, and spironolactone25 mg daily for control -Hold homespironolactone andlosartan given AKI -Continue carvedilol 25 mg twice daily with meals -Continue amlodipine 10 mg daily -Monitor BP  T2DMwith gastroparesis and peripheral neuropathy, chronic, poorly controlled: A1c 7.2% 11/2017. CBG 232 on admit, 182 on 11/19. Patient prescribed Lantus 40 mg twice daily and NovoLog 15 units up to twice daily with meals. -Obtain A1c -mSSI -Lantus 20 unitsBID -CBGsACHS  Depression with psychotic features: -Hydroxyzine 25 mg 3 times daily as needed anxiety, itching -discontinue Seroquel d/t AMS  Tobacco use disorder: -Nicotine patch 14 mg  Allergies: -Claritin10mg daily -Hydroxyzine 25 mg 3 times daily as needed anxiety, itching -Flonase -Prednisolone eyedrops twice daily as needed itching  FEN/GI:IV NS 100cc/hr, n.p.o., advance diet as tolerated, Protonix JYN:WGNF  Disposition:Discharge home when medically cleared  Subjective:  The patient was seen lying in bed intermittently somnolent. She states she is not in much pain and would like medicine to sleep. She  denies chest pain, shortness of breath, fevers, nausea, vomiting, abdominal pain, dysuria, and diarrhea.   Objective: Temp:  [98.1 F (36.7 C)-104 F (40 C)] 99 F (37.2 C) (11/21 0752) Pulse Rate:  [  81-99] 89 (11/21 0752) Resp:  [12-32] 21 (11/21 0752) BP: (90-141)/(36-71) 105/44 (11/21 0752) SpO2:  [92 %-100 %] 100 % (11/21 0752) Weight:  [97.5 kg] 97.5 kg (11/21 0342)  Physical Exam  Constitutional: She appears well-developed and well-nourished.  Cardiovascular: Normal rate, regular rhythm, normal heart sounds and intact distal pulses.  Pulmonary/Chest: Effort normal and breath sounds normal.  Abdominal: Soft. Bowel sounds are normal. She exhibits no distension.  Obese abdomen  Musculoskeletal: She exhibits no edema.  SCDs in place to bilateral LEs.  Skin: Skin is warm. Capillary refill takes less than 2 seconds.   Laboratory: Recent Labs  Lab 03/08/18 2058 03/09/18 0542 03/10/18 0116  WBC 17.7* 14.7* 13.3*  HGB 10.9* 10.3* 9.6*  HCT 36.5 33.9* 31.7*  PLT 183 148* 132*   Recent Labs  Lab 03/09/18 0542 03/10/18 0116 03/11/18 0421  NA 136 137 137  K 3.2* 3.4* 3.8  CL 104 107 107  CO2 24 23 22   BUN 13 12 14   CREATININE 1.09* 1.04* 1.10*  CALCIUM 7.7* 7.6* 7.1*  PROT 6.5 6.3*  --   BILITOT 0.7 0.6  --   ALKPHOS 56 57  --   ALT 10 11  --   AST 16 21  --   GLUCOSE 182* 125* 149*   Imaging/Diagnostic Tests: Ct Abdomen Pelvis W Contrast  Result Date: 03/09/2018 CLINICAL DATA:  Generalize weakness, nausea, and diarrhea for 3 days. History of abscess on the buttocks. EXAM: CT ABDOMEN AND PELVIS WITH CONTRAST TECHNIQUE: Multidetector CT imaging of the abdomen and pelvis was performed using the standard protocol following bolus administration of intravenous contrast. CONTRAST:  117mL ISOVUE-300 IOPAMIDOL (ISOVUE-300) INJECTION 61% COMPARISON:  MRI abdomen 01/17/2016. CT abdomen and pelvis 01/14/2016. FINDINGS: Lower chest: Dependent changes in the lung bases. Small  esophageal hiatal hernia. Hepatobiliary: Diffuse fatty infiltration of the liver. No focal liver abnormality is seen. Status post cholecystectomy. No biliary dilatation. Pancreas: Unremarkable. No pancreatic ductal dilatation or surrounding inflammatory changes. Spleen: Normal in size without focal abnormality. Adrenals/Urinary Tract: Adrenal glands are unremarkable. Kidneys are normal, without renal calculi, focal lesion, or hydronephrosis. Bladder is unremarkable. Stomach/Bowel: Stomach, small bowel, and colon are not abnormally distended. No wall thickening is appreciated. Gas and fluid collection in the right perirectal space measuring up to about 2.1 cm diameter and extending to just above the levator ani muscles. Prominent soft tissue gas demonstrated in the perianal region and extending inferiorly to the perineal fat bilaterally. This could be due to bowel fistula or infection from gas-forming organism. Vascular/Lymphatic: Aortic atherosclerosis. No enlarged abdominal or pelvic lymph nodes. Reproductive: Status post hysterectomy. No adnexal masses. Other: No free air or free fluid in the abdomen. Anterior abdominal wall hernia near the umbilicus containing fat. Musculoskeletal: No acute or significant osseous findings. IMPRESSION: 1. Right perianal abscess extending to just above the levator ani muscles. Prominent soft tissue gas in the perianal region and extending inferiorly to the perineal fat bilaterally. This could be due to bowel fistula or soft tissue infection from gas-forming organism. 2. Diffuse fatty infiltration of the liver. 3. Small esophageal hiatal hernia. Small anterior abdominal wall hernia containing fat. Aortic Atherosclerosis (ICD10-I70.0). Electronically Signed   By: Lucienne Capers M.D.   On: 03/09/2018 01:04   Nm Myocar Multi W/spect W/wall Motion / Ef  Result Date: 03/10/2018 CLINICAL DATA:  Heart murmur.  Diabetes.  CHF. EXAM: MYOCARDIAL IMAGING WITH SPECT (REST AND  PHARMACOLOGIC-STRESS) GATED LEFT VENTRICULAR WALL MOTION STUDY LEFT VENTRICULAR EJECTION FRACTION  TECHNIQUE: Standard myocardial SPECT imaging was performed after resting intravenous injection of 10 mCi Tc-28m tetrofosmin. Subsequently, intravenous infusion of Lexiscan was performed under the supervision of the Cardiology staff. At peak effect of the drug, 30 mCi Tc-15m tetrofosmin was injected intravenously and standard myocardial SPECT imaging was performed. Quantitative gated imaging was also performed to evaluate left ventricular wall motion, and estimate left ventricular ejection fraction. COMPARISON:  Chest radiograph of 04/03/2017 FINDINGS: Perfusion: No decreased activity in the left ventricle on stress imaging to suggest reversible ischemia or infarction. Wall Motion: Left ventricular free wall hypokinesis. Left Ventricular Ejection Fraction: 58 % End diastolic volume 89 ml End systolic volume 38 ml IMPRESSION: 1. No reversible ischemia or infarction. 2. Left ventricular free wall hypokinesis. 3. Left ventricular ejection fraction 58% 4. Non invasive risk stratification*: Low *2012 Appropriate Use Criteria for Coronary Revascularization Focused Update: J Am Coll Cardiol. 8329;19(1):660-600. http://content.[email or web address].aspx?articleid=[email or web address] Electronically Signed   By: Abigail Miyamoto M.D.   On: 03/10/2018 10:59   Dg Chest Port 1 View  Result Date: 03/11/2018 CLINICAL DATA:  Postoperative fever EXAM: PORTABLE CHEST 1 VIEW COMPARISON:  04/03/2017 FINDINGS: Low volume chest with coarse interstitial opacities on both sides. There could also be airspace disease. Cardiomegaly accentuated by low volumes. No edema, effusion, or pneumothorax. IMPRESSION: Interstitial coarsening which could be atelectasis, aspiration, or early infection. Electronically Signed   By: Monte Fantasia M.D.   On: 03/11/2018 07:33    Daisy Floro, DO 03/11/2018, 8:04 AM PGY-1, [place] Intern  pager: [phone], text pages welcome

## 2018-03-11 NOTE — Progress Notes (Signed)
Progress Note  Patient Name: [name] [name] Date of Encounter: 03/11/2018  Primary Cardiologist: Pixie Casino, MD   Subjective   Minimally arousable today.  Likely related to sedation.  Apparently required Narcan overnight.  Inpatient Medications    Scheduled Meds: . amLODipine  10 mg Oral Daily  . carvedilol  25 mg Oral BID WC  . fluticasone  2 spray Each Nare Daily  . insulin aspart  0-15 Units Subcutaneous TID WC  . insulin glargine  20 Units Subcutaneous BID  . loratadine  10 mg Oral Daily  . nicotine  14 mg Transdermal Daily  . pantoprazole (PROTONIX) IV  40 mg Intravenous Q24H  . QUEtiapine  25 mg Oral QHS  . rosuvastatin  20 mg Oral q1800   Continuous Infusions: . cefTRIAXone (ROCEPHIN)  IV 1 g (03/10/18 2052)  . lactated ringers 10 mL/hr at 03/09/18 0826  . vancomycin 1,250 mg (03/11/18 0100)   PRN Meds: acetaminophen **OR** acetaminophen, albuterol, lip balm, nitroGLYCERIN, ondansetron (ZOFRAN) IV, oxyCODONE, polyvinyl alcohol, prednisoLONE acetate   Vital Signs    Vitals:   03/11/18 0342 03/11/18 0550 03/11/18 0752 03/11/18 0820  BP:   (!) 105/44   Pulse:   89   Resp:   (!) 21   Temp:  99.6 F (37.6 C) 99 F (37.2 C) (!) 100.9 F (38.3 C)  TempSrc:  Oral Axillary Rectal  SpO2:   100%   Weight: 97.5 kg     Height:        Intake/Output Summary (Last 24 hours) at 03/11/2018 0953 Last data filed at 03/11/2018 0942 Gross per 24 hour  Intake 1450 ml  Output 955 ml  Net 495 ml   Filed Weights   03/09/18 0835 03/10/18 0453 03/11/18 0342  Weight: 92 kg 95 kg 97.5 kg    Physical Exam   General: Obese.  Very drowsy and minimally responsive now. Head: Normocephalic, atraumatic,  clear, moist mucus membranes. Neck: No JVD or carotid bruit. Lungs:Clear to ausculation bilaterally. No wheezes, rales, or rhonchi. Breathing is unlabored. Cardiovascular: RRR with S1 S2. No murmurs, rubs, gallops, or LV heave appreciated.;  She did not appear to  have chest wall tenderness today. Abdomen: Soft, non-tender, non-distended with normoactive bowel sounds. No hepatomegaly, No rebound/guarding. No obvious abdominal masses. MSK: Strength and tone appear normal for age. 5/5 in all extremities Extremities: No edema. No clubbing or cyanosis. DP/PT pulses 2+ bilaterally Neuro: Awake and arousable, but very sleepy.  Labs    Chemistry Recent Labs  Lab 03/09/18 0542 03/10/18 0116 03/11/18 0421  NA 136 137 137  K 3.2* 3.4* 3.8  CL 104 107 107  CO2 24 23 22   GLUCOSE 182* 125* 149*  BUN 13 12 14   CREATININE 1.09* 1.04* 1.10*  CALCIUM 7.7* 7.6* 7.1*  PROT 6.5 6.3*  --   ALBUMIN 2.9* 2.5*  --   AST 16 21  --   ALT 10 11  --   ALKPHOS 56 57  --   BILITOT 0.7 0.6  --   GFRNONAA 51* 54* 50*  GFRAA 59* >60 58*  ANIONGAP 8 7 8      Hematology Recent Labs  Lab 03/08/18 2058 03/09/18 0542 03/10/18 0116  WBC 17.7* 14.7* 13.3*  RBC 4.03 3.75* 3.50*  HGB 10.9* 10.3* 9.6*  HCT 36.5 33.9* 31.7*  MCV 90.6 90.4 90.6  MCH 27.0 27.5 27.4  MCHC 29.9* 30.4 30.3  RDW 13.4 13.6 13.7  PLT 183 148* 132*  Cardiac Enzymes Recent Labs  Lab 03/09/18 1544 03/09/18 2040 03/10/18 0116  TROPONINI 0.19* 0.18* 0.18*   No results for input(s): TROPIPOC in the last 168 hours.   BNPNo results for input(s): BNP, PROBNP in the last 168 hours.   DDimer No results for input(s): DDIMER in the last 168 hours.   Radiology    Nm Myocar Multi W/spect W/wall Motion / Ef  Result Date: 03/10/2018 CLINICAL DATA:  Heart murmur.  Diabetes.  CHF. EXAM: MYOCARDIAL IMAGING WITH SPECT (REST AND PHARMACOLOGIC-STRESS) GATED LEFT VENTRICULAR WALL MOTION STUDY LEFT VENTRICULAR EJECTION FRACTION TECHNIQUE: Standard myocardial SPECT imaging was performed after resting intravenous injection of 10 mCi Tc-57m tetrofosmin. Subsequently, intravenous infusion of Lexiscan was performed under the supervision of the Cardiology staff. At peak effect of the drug, 30 mCi Tc-73m  tetrofosmin was injected intravenously and standard myocardial SPECT imaging was performed. Quantitative gated imaging was also performed to evaluate left ventricular wall motion, and estimate left ventricular ejection fraction. COMPARISON:  Chest radiograph of 04/03/2017 FINDINGS: Perfusion: No decreased activity in the left ventricle on stress imaging to suggest reversible ischemia or infarction. Wall Motion: Left ventricular free wall hypokinesis. Left Ventricular Ejection Fraction: 58 % End diastolic volume 89 ml End systolic volume 38 ml IMPRESSION: 1. No reversible ischemia or infarction. 2. Left ventricular free wall hypokinesis. 3. Left ventricular ejection fraction 58% 4. Non invasive risk stratification*: Low *2012 Appropriate Use Criteria for Coronary Revascularization Focused Update: J Am Coll Cardiol. 9622;29(7):989-211. http://content.[email or web address].aspx?articleid=[email or web address] Electronically Signed   By: Abigail Miyamoto M.D.   On: 03/10/2018 10:59   Dg Chest Port 1 View  Result Date: 03/11/2018 CLINICAL DATA:  Postoperative fever EXAM: PORTABLE CHEST 1 VIEW COMPARISON:  04/03/2017 FINDINGS: Low volume chest with coarse interstitial opacities on both sides. There could also be airspace disease. Cardiomegaly accentuated by low volumes. No edema, effusion, or pneumothorax. IMPRESSION: Interstitial coarsening which could be atelectasis, aspiration, or early infection. Electronically Signed   By: Monte Fantasia M.D.   On: 03/11/2018 07:33    Telemetry    Sinus rhythm- Personally Reviewed  ECG    No new tracing as of 03/11/18- Personally Reviewed  Cardiac Studies   Myoview Stress Test: 03/10/18:  IMPRESSION: 1. No reversible ischemia or infarction.  2. Left ventricular free wall hypokinesis.  3. Left ventricular ejection fraction 58%  4. Non invasive risk stratification*: Low  Patient Profile     67 y.o. female with a hx of DM2 with gastroparesis and peripheral neuropathy,  HTN, CAD with stable angina, HFpEF, HLD, major depression with psychotic features, anxiety and fibromyalgia who is being seen today for the evaluation of EKG changes with T wave inversion the request of Dr. Conrad [place]  Assessment & Plan    1. Abnormal EKG with c/o chest pain: -Trop with flat trend and chest pain which was reproducible with palpation --> elevated troponin likely related to demand ischemia from sepsis -EKG abnormal EKG but similar to 2017 -Underwent I&D yesterday per surgical team -Hep gtt stopped and was not restarted in the setting of low risk Myoview stress test on 03/10/18 -Denies chest pain this AM    2. AKI: -Creatinine, 1.10 today -Baseline appears to be in the 0.8 range -s/p IVF resuscitation on presentation -Avoid nephrotoxic medications   3. DM2: -Uncontrolled, HbA1C, 7.2 in 11/2017 -SSI for glucose control while inpatient status -Per primary team   4. Hypertension:  -Stable, 105/44>141/60>131/58 -Continue amlodipine10,coreg 25  5. HLD: -Stable, LDL 65 in 2017 -Continue statin  Signed, Kathyrn Drown NP-C [facility] Pager: [phone] 03/11/2018, 9:53 AM     I have seen, examined and evaluated the patient this morning along with Kathyrn Drown, NP.  After reviewing all the available data and chart, we discussed the patients laboratory, study & physical findings as well as symptoms in detail. I agree with her findings, examination as well as impression recommendations as per our discussion.    Attending adjustments noted in italics.   Overall seems relatively stable from a cardiac standpoint.  On stable regimen.  Myoview read as low risk with no reversible defect.  Can arrange outpatient follow-up with APP to reevaluate chest pain in the outpatient setting.  No further cardiac recommendations at this time.  [facility] will sign off.   Medication Recommendations: Continue current doses of amlodipine and carvedilol as well as Crestor.  Restart  aspirin when stable postop. Other recommendations (labs, testing, etc): No further testing Follow up as an outpatient: We will arrange APP follow-up in roughly 3 to 4 weeks.    Glenetta Hew, M.D., M.S. Interventional Cardiologist   Pager # [phone] Phone # [phone] [address]. [place], [place] [address]    For questions or updates, please contact   Please consult [email or web address] for contact info under Cardiology/STEMI.

## 2018-03-11 NOTE — Progress Notes (Addendum)
Pharmacy Antibiotic Note  [name] [name] is a 67 y.o. female admitted on 03/08/2018 with perianal abcess.  Pharmacy has been consulted for vancomycin dosing. Ceftriaxone has been reordered per MD. Pt now s/p I&D 11/20, pt continues to spike fevers, CrCl ~50 ml/min.  Plan: -Ceftriaxone 1g IV q24h -Adjust vancomycin to 750mg  IV q12h -Vancomycin trough a new Css -Follow renal function  ADDENDUM:  Pharmacy consulted for addition of Zosyn. Will add 3.375g IV q8h EI and stop ceftriaxone.  Height: 5\' 3"  (160 cm) Weight: 214 lb 15.2 oz (97.5 kg) IBW/kg (Calculated) : 52.4  Temp (24hrs), Avg:101 F (38.3 C), Min:98.1 F (36.7 C), Max:104 F (40 C)  Recent Labs  Lab 03/08/18 2058 03/08/18 2315 03/09/18 0113 03/09/18 0542 03/10/18 0116 03/11/18 0421 03/11/18 0917  WBC 17.7*  --   --  14.7* 13.3*  --  11.8*  CREATININE 1.28*  --   --  1.09* 1.04* 1.10*  --   LATICACIDVEN  --  3.42* 0.92  --   --   --   --     Estimated Creatinine Clearance: 53.6 mL/min (A) (by C-G formula based on SCr of 1.1 mg/dL (H)).    Allergies  Allergen Reactions  . Desvenlafaxine Other (See Comments)    REACTION: dysphoria/dellusional  . Duloxetine Nausea And Vomiting and Other (See Comments)    REACTION: "wheezing" and tremors, dysphoria  . Gabapentin Other (See Comments)    Itching, felt crawling sensation inside  . Hydrocodone Itching  . Latex Itching    Denies airway, lung involvement  . Tramadol Itching  . Levofloxacin Other (See Comments)    Shortness of breath with headache  . Lithium Rash    Antimicrobials this admission: 11/19 Vancomycin>> 11/19 Rocephin >>   Microbiology results: 11/20 Abscess: few GPC, mod GNR 11/18 MRSA nasal - neg 11/18 Bcx - NGTD   Thank you for allowing pharmacy to be a part of this patient's care.  Arrie Senate, PharmD, BCPS Clinical Pharmacist [phone] Please check AMION for all [place] numbers 03/11/2018

## 2018-03-11 NOTE — Progress Notes (Signed)
Patient's repeat rectal temperature was 101.1 Farenheit.  Family Medicine Teaching Service paged with this information.

## 2018-03-11 NOTE — Progress Notes (Signed)
Rectal temperature post tylenol 101.2 at 1230.

## 2018-03-11 NOTE — Progress Notes (Signed)
FPTS Interim Progress Note:   S: Paged by RN for 104F temperature and response to pain stimuli only. Given 650mg  rectal tylenol for elevated temperature. Evaluated patient at bedside. Only able to arouse with uncal pressure, at that time she gets upset, opens her eyes, and answers shortly, but promptly falls back asleep. She was given oxycodone 5mg  for rectal pain, lyrica 150mg , and Seroquel 25 mg around 2100 tonight. S/p perirectal abscess I&D this morning.  O: Blood pressure (!) 141/60, pulse 99, temperature (!) 104 F (40 C), temperature source Rectal, resp. rate (!) 22, height 5\' 3"  (1.6 m), weight 95 kg, SpO2 98 %.   Gen: NAD, responsive only to pain stimuli  Cardiac: RRR no m/g/r  Pulm: CTA anteriorly, no increased WOB on 2L satting well  Abdomen: soft, non-distended  Neuro: PERRL, 2/4 patellar and bicep reflexes. Responds and withdrawals to pain stimuli only, not following commands.  Ext: warm, dry, pulses intact, no edema   A/P:   AMS: Intermittent fatigue and somnolence since admission that has increased tonight, now s/p perirectal abscess I &D this morning. Has continued to fever prior to and after surgery. Question whether Seroquel and oxycodone earlier tonight are contributing to increased altered mental status in the setting of her septic picture with 104F temperature. Could also consider CO2 retention, similar (less severe) presentation once asleep last night. Glucose 150. Unlikely any arrhythmias as rate and rhythm appears normal with auscultation and tele, however will obtain EKG.  -Given additional 325mg  rectal tylenol  -Give Narcan 0.2mg   -Obtain ABG  -CXR  -EKG -Monitor closely, recheck temp in 1 hour, f/u results  Update: Patient received Narcan 0.2mg  with minimal increase in responsiveness. Able to arouse with less pain stimulation, however still not able to follow commands. Continue to monitor.   Patriciaann Clan

## 2018-03-11 NOTE — Progress Notes (Signed)
Patient has not voided since I and O cath completed prior to start of night shift.  Bladder scan completed at 0058 was 21mL.  Repeat bladder scan completed and showed greater than 321mL.  RN text paged Family Medicine Teaching Service with this information.  New order entered per Dr. Higinio Plan (on call for Teaching Service).

## 2018-03-11 NOTE — Progress Notes (Addendum)
Spoke to Dr Maudie Mercury about patient's continued drowsiness.  Asked for order for Lactic Acid.

## 2018-03-11 NOTE — Progress Notes (Addendum)
Rectal temperature 102.3 at 1636.  650 mg po tylenol given at 1659.  Spoke with Dr Maudie Mercury about continued fevers and lethargy.  Patient still arousable and answering orientation questions.  Will continue to monitor.  Dr. Maudie Mercury stated that ID will see patient 11/22.     Unable to complete ordered sitz baths as patient is drowsy and unable to get out of bed.  Perirectal incision flushed with sterile saline and ABD pad place.

## 2018-03-11 NOTE — Progress Notes (Signed)
RN into administer scheduled antibiotic, vitals obtained.  Axillary temp 102.9 Farenheit, rectal temperature checked and was 104 Farenheit.  Patient only responsive to painful stimuli.  Rectal tylenol given per PRN order, ice packs applied to patient's groin, bilateral armpits and behind patient's neck. Family Medicine Teaching Service paged with this information.

## 2018-03-11 NOTE — Progress Notes (Signed)
PT Cancellation Note  Patient Details Name: [name] [name] MRN: [id number] DOB: December 18, 1950   Cancelled Treatment:    Reason Eval/Treat Not Completed: Patient not medically ready Holding PT evaluation as pt continues to spike fevers. Rn to page MD. Will follow.   Marguarite Arbour A Clearence Vitug 03/11/2018, 1:57 PM Wray Kearns, PT, DPT Acute Rehabilitation Services Pager [phone] Office [phone]

## 2018-03-11 NOTE — Progress Notes (Signed)
[facility] Progress Note  1 Day Post-Op  Subjective: CC-  Patient states that she is sore from surgery yesterday. Febrile over night to 104. WBC down to 11.8. Blood cultures NGTD. She is on rocephin and vancomycin.  Objective: Vital signs in last 24 hours: Temp:  [98.1 F (36.7 C)-104 F (40 C)] 100.9 F (38.3 C) (11/21 0820) Pulse Rate:  [81-99] 89 (11/21 0752) Resp:  [12-32] 21 (11/21 0752) BP: (91-141)/(44-71) 117/57 (11/21 1026) SpO2:  [92 %-100 %] 100 % (11/21 0752) Weight:  [97.5 kg] 97.5 kg (11/21 0342) Last BM Date: 03/08/18  Intake/Output from previous day: 11/20 0701 - 11/21 0700 In: 1390 [P.O.:240; I.V.:300; IV Piggyback:350] Out: 505 [Urine:500; Blood:5] Intake/Output this shift: Total I/O In: 60 [P.O.:60] Out: 450 [Urine:450]  PE: Gen:  Alert, NAD, pleasant HEENT: EOM's intact, pupils equal and round Pulm:  effort normal GU: right perirectal abscess s/p I&D with penrose drain in place >> packing removed, trace purulent drainage noted on gauze, no fluctuance or induration  Lab Results:  Recent Labs    03/10/18 0116 03/11/18 0917  WBC 13.3* 11.8*  HGB 9.6* 9.0*  HCT 31.7* 30.5*  PLT 132* 146*   BMET Recent Labs    03/10/18 0116 03/11/18 0421  NA 137 137  K 3.4* 3.8  CL 107 107  CO2 23 22  GLUCOSE 125* 149*  BUN 12 14  CREATININE 1.04* 1.10*  CALCIUM 7.6* 7.1*   PT/INR Recent Labs    03/09/18 0542  LABPROT 15.0  INR 1.19   CMP     Component Value Date/Time   NA 137 03/11/2018 0421   NA 142 12/02/2017 1119   K 3.8 03/11/2018 0421   CL 107 03/11/2018 0421   CO2 22 03/11/2018 0421   GLUCOSE 149 (H) 03/11/2018 0421   BUN 14 03/11/2018 0421   BUN 8 12/02/2017 1119   CREATININE 1.10 (H) 03/11/2018 0421   CREATININE 0.75 03/19/2016 1444   CALCIUM 7.1 (L) 03/11/2018 0421   PROT 6.3 (L) 03/10/2018 0116   PROT 7.3 12/02/2017 1119   ALBUMIN 2.5 (L) 03/10/2018 0116   ALBUMIN 4.3 12/02/2017 1119   AST 21 03/10/2018 0116    ALT 11 03/10/2018 0116   ALKPHOS 57 03/10/2018 0116   BILITOT 0.6 03/10/2018 0116   BILITOT 0.4 12/02/2017 1119   GFRNONAA 50 (L) 03/11/2018 0421   GFRNONAA 83 03/19/2016 1444   GFRAA 58 (L) 03/11/2018 0421   GFRAA >89 03/19/2016 1444   Lipase     Component Value Date/Time   LIPASE 22 12/02/2017 1119       Studies/Results: Nm Myocar Multi W/spect W/wall Motion / Ef  Result Date: 03/10/2018 CLINICAL DATA:  Heart murmur.  Diabetes.  CHF. EXAM: MYOCARDIAL IMAGING WITH SPECT (REST AND PHARMACOLOGIC-STRESS) GATED LEFT VENTRICULAR WALL MOTION STUDY LEFT VENTRICULAR EJECTION FRACTION TECHNIQUE: Standard myocardial SPECT imaging was performed after resting intravenous injection of 10 mCi Tc-12m tetrofosmin. Subsequently, intravenous infusion of Lexiscan was performed under the supervision of the Cardiology staff. At peak effect of the drug, 30 mCi Tc-15m tetrofosmin was injected intravenously and standard myocardial SPECT imaging was performed. Quantitative gated imaging was also performed to evaluate left ventricular wall motion, and estimate left ventricular ejection fraction. COMPARISON:  Chest radiograph of 04/03/2017 FINDINGS: Perfusion: No decreased activity in the left ventricle on stress imaging to suggest reversible ischemia or infarction. Wall Motion: Left ventricular free wall hypokinesis. Left Ventricular Ejection Fraction: 58 % End diastolic volume 89 ml End systolic  volume 38 ml IMPRESSION: 1. No reversible ischemia or infarction. 2. Left ventricular free wall hypokinesis. 3. Left ventricular ejection fraction 58% 4. Non invasive risk stratification*: Low *2012 Appropriate Use Criteria for Coronary Revascularization Focused Update: J Am Coll Cardiol. 2952;84(1):324-401. http://content.[email or web address]?articleid=[email or web address] Electronically Signed   By: Abigail Miyamoto M.D.   On: 03/10/2018 10:59   Dg Chest Port 1 View  Result Date: 03/11/2018 CLINICAL DATA:  Postoperative fever  EXAM: PORTABLE CHEST 1 VIEW COMPARISON:  04/03/2017 FINDINGS: Low volume chest with coarse interstitial opacities on both sides. There could also be airspace disease. Cardiomegaly accentuated by low volumes. No edema, effusion, or pneumothorax. IMPRESSION: Interstitial coarsening which could be atelectasis, aspiration, or early infection. Electronically Signed   By: Monte Fantasia M.D.   On: 03/11/2018 07:33    Anti-infectives: Anti-infectives (From admission, onward)   Start     Dose/Rate Route Frequency Ordered Stop   03/10/18 2000  cefTRIAXone (ROCEPHIN) 1 g in sodium chloride 0.9 % 100 mL IVPB     1 g 200 mL/hr over 30 Minutes Intravenous Every 24 hours 03/10/18 1704     03/10/18 1215  cefTRIAXone (ROCEPHIN) 1 g in sodium chloride 0.9 % 100 mL IVPB     1 g 200 mL/hr over 30 Minutes Intravenous  Once 03/10/18 1208 03/10/18 1415   03/10/18 0100  vancomycin (VANCOCIN) 1,250 mg in sodium chloride 0.9 % 250 mL IVPB     1,250 mg 166.7 mL/hr over 90 Minutes Intravenous Every 24 hours 03/09/18 0112     03/09/18 0800  erythromycin (E-MYCIN) tablet 250 mg  Status:  Discontinued     250 mg Oral 3 times daily with meals & bedtime 03/09/18 0353 03/09/18 2056   03/09/18 0100  vancomycin (VANCOCIN) 1,750 mg in sodium chloride 0.9 % 500 mL IVPB     1,750 mg 250 mL/hr over 120 Minutes Intravenous  Once 03/09/18 0046 03/09/18 0357   03/08/18 2300  cefTRIAXone (ROCEPHIN) 1 g in sodium chloride 0.9 % 100 mL IVPB     1 g 200 mL/hr over 30 Minutes Intravenous  Once 03/08/18 2247 03/09/18 0030       Assessment/Plan HTN DM-II Depression  Tobacco abuse Abnormal EKG  Right perirectal abscess S/p Incision and drainage of right perirectal abscess (3 x 8 cm) 11/20 Dr. [name] - POD 1 - cultures pending - penrose drain in place  ID - currently rocephin 11/18>>, vancomycin 11/19>> FEN - HH/CM diet VTE - SCDs, ok for chemical DVT prophylaxis from surgical standpoint Foley - in place  Plan - Packing  removed. Continue penrose and start sitz bath. Bowel regimen to avoid constipation. Follow cultures.   LOS: 2 days    Wellington Hampshire , [facility] 03/11/2018, 10:30 AM Pager: [phone] Mon 7:00 am -11:30 AM Tues-Fri 7:00 am-4:30 pm Sat-Sun 7:00 am-11:30 am

## 2018-03-11 NOTE — Progress Notes (Signed)
Patient temperature 100.9 at 0820 during AM assessment.  Patient's temperature rechecked at 1026=102.2.  650mg  po tylenol administered.  Patient drowsy but arousable.  Dr Maudie Mercury notified.

## 2018-03-12 DIAGNOSIS — K3184 Gastroparesis: Secondary | ICD-10-CM

## 2018-03-12 DIAGNOSIS — R509 Fever, unspecified: Secondary | ICD-10-CM

## 2018-03-12 DIAGNOSIS — Z91040 Latex allergy status: Secondary | ICD-10-CM

## 2018-03-12 DIAGNOSIS — Z885 Allergy status to narcotic agent status: Secondary | ICD-10-CM

## 2018-03-12 DIAGNOSIS — Z978 Presence of other specified devices: Secondary | ICD-10-CM

## 2018-03-12 DIAGNOSIS — Z881 Allergy status to other antibiotic agents status: Secondary | ICD-10-CM

## 2018-03-12 DIAGNOSIS — Z888 Allergy status to other drugs, medicaments and biological substances status: Secondary | ICD-10-CM

## 2018-03-12 DIAGNOSIS — E1143 Type 2 diabetes mellitus with diabetic autonomic (poly)neuropathy: Secondary | ICD-10-CM

## 2018-03-12 DIAGNOSIS — Z720 Tobacco use: Secondary | ICD-10-CM

## 2018-03-12 DIAGNOSIS — J9601 Acute respiratory failure with hypoxia: Secondary | ICD-10-CM

## 2018-03-12 DIAGNOSIS — D72829 Elevated white blood cell count, unspecified: Secondary | ICD-10-CM

## 2018-03-12 DIAGNOSIS — Z794 Long term (current) use of insulin: Secondary | ICD-10-CM

## 2018-03-12 LAB — CBC
HCT: 27.3 % — ABNORMAL LOW (ref 36.0–46.0)
Hemoglobin: 8.5 g/dL — ABNORMAL LOW (ref 12.0–15.0)
MCH: 28 pg (ref 26.0–34.0)
MCHC: 31.1 g/dL (ref 30.0–36.0)
MCV: 89.8 fL (ref 80.0–100.0)
Platelets: 149 10*3/uL — ABNORMAL LOW (ref 150–400)
RBC: 3.04 MIL/uL — ABNORMAL LOW (ref 3.87–5.11)
RDW: 14.5 % (ref 11.5–15.5)
WBC: 9.9 10*3/uL (ref 4.0–10.5)
nRBC: 0 % (ref 0.0–0.2)

## 2018-03-12 LAB — GLUCOSE, CAPILLARY
Glucose-Capillary: 79 mg/dL (ref 70–99)
Glucose-Capillary: 97 mg/dL (ref 70–99)
Glucose-Capillary: 172 mg/dL — ABNORMAL HIGH (ref 70–99)
Glucose-Capillary: 138 mg/dL — ABNORMAL HIGH (ref 70–99)

## 2018-03-12 LAB — BASIC METABOLIC PANEL
Anion gap: 8 (ref 5–15)
BUN: 11 mg/dL (ref 8–23)
CO2: 23 mmol/L (ref 22–32)
Calcium: 7.3 mg/dL — ABNORMAL LOW (ref 8.9–10.3)
Chloride: 106 mmol/L (ref 98–111)
Creatinine, Ser: 1 mg/dL (ref 0.44–1.00)
GFR calc Af Amer: >60 mL/min (ref >60)
GFR calc non Af Amer: 56 mL/min — ABNORMAL LOW (ref >60)
Glucose, Bld: 96 mg/dL (ref 70–99)
Potassium: 3.5 mmol/L (ref 3.5–5.1)
Sodium: 137 mmol/L (ref 135–145)

## 2018-03-12 LAB — URINE CULTURE: Culture: NO GROWTH

## 2018-03-12 MED ORDER — CLINDAMYCIN PHOSPHATE 600 MG/50ML IV SOLN
600.0000 mg | Freq: Three times a day (TID) | INTRAVENOUS | Status: AC
  Administered 2018-03-12 – 2018-03-14 (×6): 600 mg via INTRAVENOUS
  Filled 2018-03-12 (×7): qty 50

## 2018-03-12 MED ORDER — MAGIC MOUTHWASH
10.0000 mL | Freq: Three times a day (TID) | ORAL | Status: AC
  Administered 2018-03-12 – 2018-03-13 (×5): 10 mL via ORAL
  Filled 2018-03-12 (×5): qty 10

## 2018-03-12 MED ORDER — OXYCODONE HCL 5 MG PO TABS
5.0000 mg | ORAL_TABLET | Freq: Four times a day (QID) | ORAL | Status: DC | PRN
  Administered 2018-03-12 – 2018-03-15 (×9): 5 mg via ORAL
  Filled 2018-03-12 (×9): qty 1

## 2018-03-12 MED ORDER — ENOXAPARIN SODIUM 40 MG/0.4ML ~~LOC~~ SOLN
40.0000 mg | SUBCUTANEOUS | Status: DC
  Administered 2018-03-12 – 2018-03-15 (×4): 40 mg via SUBCUTANEOUS
  Filled 2018-03-12 (×4): qty 0.4

## 2018-03-12 MED ORDER — ALUM & MAG HYDROXIDE-SIMETH 200-200-20 MG/5ML PO SUSP
30.0000 mL | Freq: Four times a day (QID) | ORAL | Status: DC | PRN
  Administered 2018-03-12: 30 mL via ORAL
  Filled 2018-03-12: qty 30

## 2018-03-12 NOTE — Progress Notes (Signed)
Patient ID: [name] [name], female   DOB: September 24, 1950, 67 y.o.   MRN: [id number]    2 Days Post-Op  Subjective: Patient still complaining of pain in her buttocks.  Patient unable to get up and walk with assistance to the bathroom to do sitz bathes per RN.  Was apparently unresponsive for some of the day yesterday.  C/o pain in her mouth.  Still with some fever overnight.  Objective: Vital signs in last 24 hours: Temp:  [99.5 F (37.5 C)-102.5 F (39.2 C)] 100.1 F (37.8 C) (11/22 0801) Pulse Rate:  [83-95] 88 (11/22 0801) Resp:  [16-25] 16 (11/22 0801) BP: (113-142)/(50-75) 142/61 (11/22 0801) SpO2:  [95 %-100 %] 96 % (11/22 0801) Last BM Date: 03/08/18  Intake/Output from previous day: 11/21 0701 - 11/22 0700 In: 1112.1 [P.O.:600; I.V.:79.5; IV Piggyback:432.6] Out: 1300 [Urine:1300] Intake/Output this shift: No intake/output data recorded.  PE: Mouth: tongue is completely coated in white Skin: buttock wound is relatively clean.  Penrose in place.  RN flushing wound out with NS since can't do sitz bath right now.  No significant erythema or induration still noted.  Lab Results:  Recent Labs    03/11/18 0917 03/12/18 0435  WBC 11.8* 9.9  HGB 9.0* 8.5*  HCT 30.5* 27.3*  PLT 146* 149*   BMET Recent Labs    03/11/18 0421 03/12/18 0435  NA 137 137  K 3.8 3.5  CL 107 106  CO2 22 23  GLUCOSE 149* 96  BUN 14 11  CREATININE 1.10* 1.00  CALCIUM 7.1* 7.3*   PT/INR No results for input(s): LABPROT, INR in the last 72 hours. CMP     Component Value Date/Time   NA 137 03/12/2018 0435   NA 142 12/02/2017 1119   K 3.5 03/12/2018 0435   CL 106 03/12/2018 0435   CO2 23 03/12/2018 0435   GLUCOSE 96 03/12/2018 0435   BUN 11 03/12/2018 0435   BUN 8 12/02/2017 1119   CREATININE 1.00 03/12/2018 0435   CREATININE 0.75 03/19/2016 1444   CALCIUM 7.3 (L) 03/12/2018 0435   PROT 6.3 (L) 03/10/2018 0116   PROT 7.3 12/02/2017 1119   ALBUMIN 2.5 (L) 03/10/2018 0116   ALBUMIN  4.3 12/02/2017 1119   AST 21 03/10/2018 0116   ALT 11 03/10/2018 0116   ALKPHOS 57 03/10/2018 0116   BILITOT 0.6 03/10/2018 0116   BILITOT 0.4 12/02/2017 1119   GFRNONAA 56 (L) 03/12/2018 0435   GFRNONAA 83 03/19/2016 1444   GFRAA >60 03/12/2018 0435   GFRAA >89 03/19/2016 1444   Lipase     Component Value Date/Time   LIPASE 22 12/02/2017 1119       Studies/Results: Dg Chest Port 1 View  Result Date: 03/11/2018 CLINICAL DATA:  Postoperative fever EXAM: PORTABLE CHEST 1 VIEW COMPARISON:  04/03/2017 FINDINGS: Low volume chest with coarse interstitial opacities on both sides. There could also be airspace disease. Cardiomegaly accentuated by low volumes. No edema, effusion, or pneumothorax. IMPRESSION: Interstitial coarsening which could be atelectasis, aspiration, or early infection. Electronically Signed   By: Monte Fantasia M.D.   On: 03/11/2018 07:33    Anti-infectives: Anti-infectives (From admission, onward)   Start     Dose/Rate Route Frequency Ordered Stop   03/11/18 1300  vancomycin (VANCOCIN) IVPB 750 mg/150 ml premix     750 mg 150 mL/hr over 60 Minutes Intravenous Every 12 hours 03/11/18 1043     03/11/18 1130  piperacillin-tazobactam (ZOSYN) IVPB 3.375 g     3.375  g 12.5 mL/hr over 240 Minutes Intravenous Every 8 hours 03/11/18 1121     03/10/18 2000  cefTRIAXone (ROCEPHIN) 1 g in sodium chloride 0.9 % 100 mL IVPB  Status:  Discontinued     1 g 200 mL/hr over 30 Minutes Intravenous Every 24 hours 03/10/18 1704 03/11/18 1116   03/10/18 1215  cefTRIAXone (ROCEPHIN) 1 g in sodium chloride 0.9 % 100 mL IVPB     1 g 200 mL/hr over 30 Minutes Intravenous  Once 03/10/18 1208 03/10/18 1415   03/10/18 0100  vancomycin (VANCOCIN) 1,250 mg in sodium chloride 0.9 % 250 mL IVPB  Status:  Discontinued     1,250 mg 166.7 mL/hr over 90 Minutes Intravenous Every 24 hours 03/09/18 0112 03/11/18 1043   03/09/18 0800  erythromycin (E-MYCIN) tablet 250 mg  Status:  Discontinued      250 mg Oral 3 times daily with meals & bedtime 03/09/18 0353 03/09/18 2056   03/09/18 0100  vancomycin (VANCOCIN) 1,750 mg in sodium chloride 0.9 % 500 mL IVPB     1,750 mg 250 mL/hr over 120 Minutes Intravenous  Once 03/09/18 0046 03/09/18 0357   03/08/18 2300  cefTRIAXone (ROCEPHIN) 1 g in sodium chloride 0.9 % 100 mL IVPB     1 g 200 mL/hr over 30 Minutes Intravenous  Once 03/08/18 2247 03/09/18 0030       Assessment/Plan HTN DM-II Depression  Tobacco abuse Abnormal EKG  Right perirectal abscess S/p Incision and drainage of right perirectal abscess (3 x 8 cm) 11/20 Dr. [name] - POD 2 - cultures pending, but show multiple organisms.  None predominant - penrose drain in place -cont irrigation to wound while unable to do sitz bathes Oral candida -magic mouthwash TID.   ID - currently rocephin 11/18>>, vancomycin 11/19>> FEN - NPO from yesterday, can resume diet from our standpoint VTE - SCDs, ok for chemical DVT prophylaxis from surgical standpoint Foley - in place  Plan -  Continue penrose and start sitz bath when able to ambulate patient to bathroom. Bowel regimen to avoid constipation.    LOS: 3 days    Henreitta Cea , [facility] 03/12/2018, 11:21 AM Pager: [phone]

## 2018-03-12 NOTE — Progress Notes (Signed)
Family Medicine Teaching Service Daily Progress Note Intern Pager: [phone]  Patient name: [name] [name] Medical record number: [id number] Date of birth: 01-04-1951 Age: 67 y.o. Gender: female  Primary Care Provider: [name], DO Consultants: Surgery, PT/OT, ID, WOC (signed off), Cardiology Code Status: Full  Pt Overview and Major Events to Date:  11/19 - admitted for sepsis secondary to perianal abscess, altered mental status  Assessment and Plan:Kana Drach is a 67 year old female presenting with generalized weakness and fatigue found to have sepsis due to perianal abscess. PMHsignificant for T2DMdiabetes with gastroparesis and peripheral neuropathy, HTN, CAD with stable angina,HFpEF, hyperlipidemia, major depression with psychotic features, anxiety,andfibromyalgia.  Sepsis secondary to perirectal abscess: T-max overnight100.72F 11/22.Blood pressure stable this a.m.142/61 11/22. Heart rate ~88. WBCcount improved from 17.7>14.7> 13.3> 11.8>9.9 11/22. Lactic acid 3.42>0.92>1.4>0.9 11/22. -General surgery consulted-packing removed, continue Penrose and start sitz bath, bowel regimen to avoid constipation, follow cultures; appreciate recs! -ID consulted - appreciate recs -PT/OT - will evaluate once medically improved -Cardiology consulted -continue statin, continue amlodipine 10, Coreg 25. -N.p.o. due to somnolence -IV Vancomycin and Zosyn -mIVF NS @ 100cc/hr -Tylenol PRN fever and pain -Zofran PRN nausea/vomiting -Repeat blood cultures, repeat urine culture, pending wound culture.  Altered mental status:  -Monitor mental status. -Stop Seroquel and Lyrica. -Oxycodone 2.5 PRN pain  EKG Changes, resolved:EKG showingST segment depressions and inverted T waves were noticed.Troponins 0.19>0.18>0.18. -Cardiology consulted:Will obtain coronary CTA11/20-Appreciate recs -Trend troponins -Sublingual nitroglycerin -Repeat EKG  Bacteruria stable: UA 11/20  showing small leuk with many bacteria, no nitrites. No culture taken. -Continue Vanc and Zosyn  Hypokalemia:Patient presenting with K3.2 >3.8>3.5 11/22.Takes HCTZ at home. Given K inIV fluids. -Monitor BMP -Replete with K-Dur 40 mEq twice daily  Hypomagnesemia: Magnesium 1.5. -Replete with 2 g magnesium sulfate  AKI, stable: Creatinine 1.28 on admission reduced to 1.09>1.04>1.1>1.0on 11/22. Baseline 0.8. -Monitor BMP -mIVFs -Avoid nephrotoxic medications  Low Magnesium: low at 1.5>1.7 11/21. -Monitor  Hypertension, chronic, stable:BP142/6111/22.Patient takes losartan 100 mg, amlodipine 10 mg, Coreg 25 mg twice daily, and spironolactone25 mg daily for control -Hold homespironolactone andlosartan given AKI -Continue carvedilol 25 mg twice daily with meals -Continue amlodipine 10 mg daily -Monitor BP  T2DMwith gastroparesis and peripheral neuropathy, chronic, poorly controlled: A1c 7.2% 11/2017.Patient prescribed Lantus 40 mg twice daily and NovoLog 15 units up to twice daily with meals. -mSSI -Lantus 20 unitsBID -CBGsACHS  Depression with psychotic features: -Hydroxyzine 25 mg 3 times daily as needed anxiety, itching -discontinue Seroquel d/t AMS  Tobacco use disorder: -Nicotine patch 14 mg  Allergies: -Claritin10mg daily -Flonase  -Prednisolone eyedrops twice daily as needed itching  FEN/GI:IV NS 100cc/hr, Soft diet, advance diet as tolerated, Protonix WIO:XBDZ  Disposition: Discharge home when medically cleared  Subjective:  The patient is more awake and alert today and is seen lying on her side in bed.  She states she is having a lot of pain from her surgery and is requesting something to eat.  Otherwise, she has no concerns.  Objective: Temp:  [99.5 F (37.5 C)-102.5 F (39.2 C)] 100.1 F (37.8 C) (11/22 0801) Pulse Rate:  [83-95] 88 (11/22 0801) Resp:  [16-25] 16 (11/22 0801) BP: (113-142)/(50-75) 142/61 (11/22 0801) SpO2:   [95 %-100 %] 96 % (11/22 0801)  Physical Exam  Constitutional: She is oriented to person, place, and time. She appears well-developed and well-nourished.  Cardiovascular: Normal rate, regular rhythm, normal heart sounds and intact distal pulses.  Pulmonary/Chest: Effort normal and breath sounds normal.  Abdominal: Soft. Bowel sounds are normal. She  exhibits no distension.  Obese abdomen  Musculoskeletal: She exhibits no edema.  SCDs in place to bilateral LEs.  Neurological: She is alert and oriented to person, place, and time.  Much more alert and cooperative today.   Skin: Skin is warm. Capillary refill takes less than 2 seconds.   Laboratory: Recent Labs  Lab 03/10/18 0116 03/11/18 0917 03/12/18 0435  WBC 13.3* 11.8* 9.9  HGB 9.6* 9.0* 8.5*  HCT 31.7* 30.5* 27.3*  PLT 132* 146* 149*   Recent Labs  Lab 03/09/18 0542 03/10/18 0116 03/11/18 0421 03/12/18 0435  NA 136 137 137 137  K 3.2* 3.4* 3.8 3.5  CL 104 107 107 106  CO2 24 23 22 23   BUN 13 12 14 11   CREATININE 1.09* 1.04* 1.10* 1.00  CALCIUM 7.7* 7.6* 7.1* 7.3*  PROT 6.5 6.3*  --   --   BILITOT 0.7 0.6  --   --   ALKPHOS 56 57  --   --   ALT 10 11  --   --   AST 16 21  --   --   GLUCOSE 182* 125* 149* 96   Imaging/Diagnostic Tests: Ct Abdomen Pelvis W Contrast  Result Date: 03/09/2018 CLINICAL DATA:  Generalize weakness, nausea, and diarrhea for 3 days. History of abscess on the buttocks. EXAM: CT ABDOMEN AND PELVIS WITH CONTRAST TECHNIQUE: Multidetector CT imaging of the abdomen and pelvis was performed using the standard protocol following bolus administration of intravenous contrast. CONTRAST:  170mL ISOVUE-300 IOPAMIDOL (ISOVUE-300) INJECTION 61% COMPARISON:  MRI abdomen 01/17/2016. CT abdomen and pelvis 01/14/2016. FINDINGS: Lower chest: Dependent changes in the lung bases. Small esophageal hiatal hernia. Hepatobiliary: Diffuse fatty infiltration of the liver. No focal liver abnormality is seen. Status post  cholecystectomy. No biliary dilatation. Pancreas: Unremarkable. No pancreatic ductal dilatation or surrounding inflammatory changes. Spleen: Normal in size without focal abnormality. Adrenals/Urinary Tract: Adrenal glands are unremarkable. Kidneys are normal, without renal calculi, focal lesion, or hydronephrosis. Bladder is unremarkable. Stomach/Bowel: Stomach, small bowel, and colon are not abnormally distended. No wall thickening is appreciated. Gas and fluid collection in the right perirectal space measuring up to about 2.1 cm diameter and extending to just above the levator ani muscles. Prominent soft tissue gas demonstrated in the perianal region and extending inferiorly to the perineal fat bilaterally. This could be due to bowel fistula or infection from gas-forming organism. Vascular/Lymphatic: Aortic atherosclerosis. No enlarged abdominal or pelvic lymph nodes. Reproductive: Status post hysterectomy. No adnexal masses. Other: No free air or free fluid in the abdomen. Anterior abdominal wall hernia near the umbilicus containing fat. Musculoskeletal: No acute or significant osseous findings. IMPRESSION: 1. Right perianal abscess extending to just above the levator ani muscles. Prominent soft tissue gas in the perianal region and extending inferiorly to the perineal fat bilaterally. This could be due to bowel fistula or soft tissue infection from gas-forming organism. 2. Diffuse fatty infiltration of the liver. 3. Small esophageal hiatal hernia. Small anterior abdominal wall hernia containing fat. Aortic Atherosclerosis (ICD10-I70.0). Electronically Signed   By: Lucienne Capers M.D.   On: 03/09/2018 01:04   Nm Myocar Multi W/spect W/wall Motion / Ef  Result Date: 03/10/2018 CLINICAL DATA:  Heart murmur.  Diabetes.  CHF. EXAM: MYOCARDIAL IMAGING WITH SPECT (REST AND PHARMACOLOGIC-STRESS) GATED LEFT VENTRICULAR WALL MOTION STUDY LEFT VENTRICULAR EJECTION FRACTION TECHNIQUE: Standard myocardial SPECT  imaging was performed after resting intravenous injection of 10 mCi Tc-35m tetrofosmin. Subsequently, intravenous infusion of Lexiscan was performed under the supervision  of the Cardiology staff. At peak effect of the drug, 30 mCi Tc-29m tetrofosmin was injected intravenously and standard myocardial SPECT imaging was performed. Quantitative gated imaging was also performed to evaluate left ventricular wall motion, and estimate left ventricular ejection fraction. COMPARISON:  Chest radiograph of 04/03/2017 FINDINGS: Perfusion: No decreased activity in the left ventricle on stress imaging to suggest reversible ischemia or infarction. Wall Motion: Left ventricular free wall hypokinesis. Left Ventricular Ejection Fraction: 58 % End diastolic volume 89 ml End systolic volume 38 ml IMPRESSION: 1. No reversible ischemia or infarction. 2. Left ventricular free wall hypokinesis. 3. Left ventricular ejection fraction 58% 4. Non invasive risk stratification*: Low *2012 Appropriate Use Criteria for Coronary Revascularization Focused Update: J Am Coll Cardiol. 0315;94(5):859-292. http://content.airportbarriers.com.aspx?articleid=[email or web address] Electronically Signed   By: Abigail Miyamoto M.D.   On: 03/10/2018 10:59   Dg Chest Port 1 View  Result Date: 03/11/2018 CLINICAL DATA:  Postoperative fever EXAM: PORTABLE CHEST 1 VIEW COMPARISON:  04/03/2017 FINDINGS: Low volume chest with coarse interstitial opacities on both sides. There could also be airspace disease. Cardiomegaly accentuated by low volumes. No edema, effusion, or pneumothorax. IMPRESSION: Interstitial coarsening which could be atelectasis, aspiration, or early infection. Electronically Signed   By: Monte Fantasia M.D.   On: 03/11/2018 07:33   Daisy Floro, DO 03/12/2018, 8:37 AM PGY-1, [place] Intern pager: [phone], text pages welcome

## 2018-03-12 NOTE — Evaluation (Signed)
Occupational Therapy Evaluation Patient Details Name: [name] [name] MRN: [id number] DOB: 08/09/1950 Today's Date: 03/12/2018    History of Present Illness 67 y.o. female presenting with generalized weakness and fatigue, found to have sepsis due to perirectal abscess. She underwent I&D 03/10/18. PMH is significant for T2DM with gastroparesis and peripheral neuropathy, HTN, CAD with stable angina, HFpEF, HLD, major depression with psychotic features, anxiety, and fibromyalgia.    Clinical Impression   PT admitted with above diagnosis. Pt currently with functional limitiations due to the deficits listed below (see OT problem list). PTA was living independently for all adls. Pt currenlty requires (A) for bed mobility and total +2 (A) min (A) for basic sit<S>tand. Pt was unable to tolerate transfer or progress past sit<>stand due to current pain. Pt will benefit from skilled OT to increase their independence and safety with adls and balance to allow discharge [place] pending progress. Pt will need to have better pain management to advance to home level.      Follow Up Recommendations  Home health OT(if continues to progress)    Equipment Recommendations  3 in 1 bedside commode    Recommendations for Other Services Other (comment)(home RN due to location of wound ?)     Precautions / Restrictions Precautions Precautions: Fall      Mobility Bed Mobility Overal bed mobility: Needs Assistance Bed Mobility: Supine to Sit;Sit to Supine     Supine to sit: Min assist;HOB elevated Sit to supine: Min assist   General bed mobility comments: +rail, increased time and effort due to pain  Transfers Overall transfer level: Needs assistance Equipment used: Rolling walker (2 wheeled) Transfers: Sit to/from Stand Sit to Stand: Min assist;+2 physical assistance         General transfer comment: increased time and effort due to pain. Pt unable to attain full, upright stance. Pt stayed flexed  over RW, probably due to pain with contraction of gluts.    Balance Overall balance assessment: Needs assistance Sitting-balance support: Bilateral upper extremity supported;Feet supported Sitting balance-Leahy Scale: Fair Sitting balance - Comments: BUE support for pressure relief on bottom                                   ADL either performed or assessed with clinical judgement   ADL Overall ADL's : Needs assistance/impaired Eating/Feeding: Set up   Grooming: Set up   Upper Body Bathing: Minimal assistance   Lower Body Bathing: Maximal assistance       Lower Body Dressing: Maximal assistance   Toilet Transfer: RW;+2 for physical assistance;Minimal assistance             General ADL Comments: pt very limted by R buttock pain. pt asking multiple questions about antibotics during session even after patient told the antibotic was on the IV pole and to ask further questions to RN. pt c/o pain in R eye so eye drops were requested     Vision Baseline Vision/History: Wears glasses Wears Glasses: At all times       Perception     Praxis      Pertinent Vitals/Pain Pain Assessment: 0-10 Pain Score: 10-Worst pain ever Pain Location: R buttocks Pain Descriptors / Indicators: Burning;Tender Pain Intervention(s): Monitored during session;Premedicated before session;Repositioned     Hand Dominance Right   Extremity/Trunk Assessment Upper Extremity Assessment Upper Extremity Assessment: Overall WFL for tasks assessed   Lower Extremity Assessment Lower Extremity Assessment:  Generalized weakness   Cervical / Trunk Assessment Cervical / Trunk Assessment: Kyphotic   Communication Communication Communication: No difficulties   Cognition Arousal/Alertness: Awake/alert Behavior During Therapy: WFL for tasks assessed/performed;Anxious Overall Cognitive Status: No family/caregiver present to determine baseline cognitive functioning                                  General Comments: mild paranoia - asking questions several times but oriented x4   General Comments       Exercises     Shoulder Instructions      Home Living Family/patient expects to be discharged to:: Private residence Living Arrangements: Children Available Help at Discharge: Friend(s);Family;Available PRN/intermittently Type of Home: House Home Access: Level entry     Home Layout: Two level;1/2 bath on main level;Able to live on main level with bedroom/bathroom     Bathroom Shower/Tub: Other (comment)(sponge bath)   Bathroom Toilet: Standard     Home Equipment: Cane - quad   Additional Comments: take scat to grocery store, uses scooter in grocery store      Prior Functioning/Environment Level of Independence: Needs assistance  Gait / Transfers Assistance Needed: walks with quad cane              OT Problem List: Decreased strength;Decreased range of motion;Decreased activity tolerance;Impaired balance (sitting and/or standing);Impaired vision/perception;Decreased coordination;Decreased cognition;Decreased safety awareness;Decreased knowledge of use of DME or AE;Decreased knowledge of precautions;Cardiopulmonary status limiting activity;Obesity;Pain      OT Treatment/Interventions: Self-care/ADL training;Therapeutic exercise;Neuromuscular education;Energy conservation;DME and/or AE instruction;Therapeutic activities;Cognitive remediation/compensation;Visual/perceptual remediation/compensation;Patient/family education;Balance training    OT Goals(Current goals can be found in the care plan section) Acute Rehab OT Goals Patient Stated Goal: home OT Goal Formulation: With patient Time For Goal Achievement: 03/26/18 Potential to Achieve Goals: Good  OT Frequency: Min 2X/week   Barriers to D/C:            Co-evaluation PT/OT/SLP Co-Evaluation/Treatment: Yes Reason for Co-Treatment: Complexity of the patient's impairments (multi-system  involvement);Necessary to address cognition/behavior during functional activity;For patient/therapist safety;To address functional/ADL transfers PT goals addressed during session: Mobility/safety with mobility;Proper use of DME;Balance OT goals addressed during session: ADL's and self-care;Proper use of Adaptive equipment and DME;Strengthening/ROM      AM-PAC PT "6 Clicks" Daily Activity     Outcome Measure Help from another person eating meals?: None Help from another person taking care of personal grooming?: A Lot Help from another person toileting, which includes using toliet, bedpan, or urinal?: A Lot Help from another person bathing (including washing, rinsing, drying)?: A Lot Help from another person to put on and taking off regular upper body clothing?: A Little Help from another person to put on and taking off regular lower body clothing?: A Lot 6 Click Score: 15   End of Session Equipment Utilized During Treatment: Gait belt;Rolling walker Nurse Communication: Mobility status;Precautions  Activity Tolerance: Patient limited by pain Patient left: in bed;with call bell/phone within reach;with bed alarm set;with SCD's reapplied  OT Visit Diagnosis: Unsteadiness on feet (R26.81)                Time: 0109-3235 OT Time Calculation (min): 30 min Charges:  OT General Charges $OT Visit: 1 Visit OT Evaluation $OT Eval Moderate Complexity: 1 Mod   Jeri Modena, OTR/L  Acute Rehabilitation Services Pager: [phone] Office: [phone] .   Jeri Modena 03/12/2018, 1:05 PM
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
## 2018-03-12 NOTE — Progress Notes (Signed)
[facility] Infusion Coordinator will follow pt with ID team to support Home Infusion Pharmacy services at [place] if needed.  If patient discharges after hours, please call [phone].   [name] [name] 03/12/2018, 7:02 AM

## 2018-03-12 NOTE — Care Management Important Message (Signed)
Important Message  Patient Details  Name: [name] [name] MRN: [id number] Date of Birth: 1951/04/05   Medicare Important Message Given:  Yes    Jannel Lynne Montine Circle 03/12/2018, 4:01 PM

## 2018-03-12 NOTE — Progress Notes (Signed)
pts axillary temp 100.6 at 1950, given rectal tylenol. Axillary temp 99.5 at 2030. pts o2 sats sustaining at 84-85 % at 4 L [place]. Upon assessment, RR 29, hr 88, diminished breath sounds. Pt now on 6L [place] @ 97. Notified resp. Will continue to monitor patient closely.

## 2018-03-12 NOTE — Evaluation (Signed)
Physical Therapy Evaluation Patient Details Name: [name] [name] MRN: [id number] DOB: 1950/10/29 Today's Date: 03/12/2018   History of Present Illness  67 y.o. female presenting with generalized weakness and fatigue, found to have sepsis due to perirectal abscess. She underwent I&D 03/10/18. PMH is significant for T2DM with gastroparesis and peripheral neuropathy, HTN, CAD with stable angina, HFpEF, HLD, major depression with psychotic features, anxiety, and fibromyalgia.     Clinical Impression  Pt admitted with above diagnosis. Pt currently with functional limitations due to the deficits listed below (see PT Problem List). PTA pt lived at home with her son. She was mod I with ADLs and mobility. On eval she required min assist bed mobility and transfers. Mobility significantly limited by pain, hindering pt's ability to ambulate. Pt will benefit from skilled PT to increase their independence and safety with mobility to allow discharge to the venue listed below.       Follow Up Recommendations Home health PT;Supervision for mobility/OOB    Equipment Recommendations  Rolling walker with 5" wheels    Recommendations for Other Services       Precautions / Restrictions Precautions Precautions: Fall      Mobility  Bed Mobility Overal bed mobility: Needs Assistance Bed Mobility: Supine to Sit;Sit to Supine     Supine to sit: Min assist;HOB elevated Sit to supine: Min assist   General bed mobility comments: +rail, increased time and effort due to pain  Transfers Overall transfer level: Needs assistance Equipment used: Rolling walker (2 wheeled) Transfers: Sit to/from Stand Sit to Stand: Min assist;+2 physical assistance         General transfer comment: increased time and effort due to pain. Pt unable to attain full, upright stance. Pt stayed flexed over RW, probably due to pain with contraction of gluts.  Ambulation/Gait             General Gait Details: unable due  to pain  Stairs            Wheelchair Mobility    Modified Rankin (Stroke Patients Only)       Balance Overall balance assessment: Needs assistance Sitting-balance support: Bilateral upper extremity supported;Feet supported Sitting balance-Leahy Scale: Fair Sitting balance - Comments: BUE support for pressure relief on bottom                                     Pertinent Vitals/Pain Pain Assessment: 0-10 Pain Score: 10-Worst pain ever Pain Location: R buttocks Pain Descriptors / Indicators: Burning;Tender Pain Intervention(s): Limited activity within patient's tolerance;Repositioned;Patient requesting pain meds-RN notified    Home Living Family/patient expects to be discharged to:: Private residence Living Arrangements: Children Available Help at Discharge: Friend(s);Family;Available PRN/intermittently Type of Home: House Home Access: Level entry     Home Layout: Two level;1/2 bath on main level;Able to live on main level with bedroom/bathroom Home Equipment: [name] [name] - quad Additional Comments: take scat to grocery store, uses scooter in grocery store    Prior Function Level of Independence: Needs assistance   Gait / Transfers Assistance Needed: walks with quad cane           Hand Dominance   Dominant Hand: Right    Extremity/Trunk Assessment   Upper Extremity Assessment Upper Extremity Assessment: Overall WFL for tasks assessed    Lower Extremity Assessment Lower Extremity Assessment: Generalized weakness(limited due to pain)    Cervical / Trunk Assessment Cervical / Trunk  Assessment: Kyphotic  Communication   Communication: No difficulties  Cognition Arousal/Alertness: Awake/alert Behavior During Therapy: WFL for tasks assessed/performed;Anxious(mildly anxious) Overall Cognitive Status: Within Functional Limits for tasks assessed                                 General Comments: mild paranoia      General  Comments      Exercises     Assessment/Plan    PT Assessment Patient needs continued PT services  PT Problem List Decreased mobility;Decreased activity tolerance;Pain;Decreased balance;Decreased knowledge of use of DME       PT Treatment Interventions DME instruction;Therapeutic activities;Gait training;Therapeutic exercise;Patient/family education;Balance training;Functional mobility training    PT Goals (Current goals can be found in the Care Plan section)  Acute Rehab PT Goals Patient Stated Goal: home PT Goal Formulation: With patient Time For Goal Achievement: 03/26/18 Potential to Achieve Goals: Good    Frequency Min 3X/week   Barriers to discharge        Co-evaluation PT/OT/SLP Co-Evaluation/Treatment: Yes Reason for Co-Treatment: For patient/therapist safety;To address functional/ADL transfers;Necessary to address cognition/behavior during functional activity PT goals addressed during session: Mobility/safety with mobility;Proper use of DME;Balance OT goals addressed during session: ADL's and self-care;Strengthening/ROM;Proper use of Adaptive equipment and DME       AM-PAC PT "6 Clicks" Daily Activity  Outcome Measure Difficulty turning over in bed (including adjusting bedclothes, sheets and blankets)?: A Lot Difficulty moving from lying on back to sitting on the side of the bed? : A Lot Difficulty sitting down on and standing up from a chair with arms (e.g., wheelchair, bedside commode, etc,.)?: A Lot Help needed moving to and from a bed to chair (including a wheelchair)?: A Little Help needed walking in hospital room?: A Lot Help needed climbing 3-5 steps with a railing? : Total 6 Click Score: 12    End of Session Equipment Utilized During Treatment: Gait belt Activity Tolerance: Patient limited by pain Patient left: in bed;with call bell/phone within reach;with bed alarm set Nurse Communication: Mobility status;Patient requests pain meds PT Visit  Diagnosis: Other abnormalities of gait and mobility (R26.89);Pain    Time: 0938-1829 PT Time Calculation (min) (ACUTE ONLY): 29 min   Charges:   PT Evaluation $PT Eval Moderate Complexity: 1 Mod          Lorrin Goodell, PT  Office # [phone] Pager [phone]   Lorriane Shire 03/12/2018, 10:20 AM

## 2018-03-12 NOTE — Plan of Care (Signed)
  Problem: Clinical Measurements: Goal: Respiratory complications will improve Outcome: Progressing Note:  No s/s of respiratory complications noted.   Problem: Clinical Measurements: Goal: Signs and symptoms of infection will decrease Outcome: Progressing   Problem: Clinical Measurements: Goal: Will remain free from infection 03/12/2018 0244 by Colonel Bald, RN Outcome: Not Progressing Note:  Receiving IV antibiotics.  Fevers continue, relieved with tylenol. 03/12/2018 0241 by Colonel Bald, RN Outcome: Not Progressing Note:  Fevers continue, resolved with tylenol.  Treating with IV antibiotics.

## 2018-03-13 DIAGNOSIS — B370 Candidal stomatitis: Secondary | ICD-10-CM

## 2018-03-13 DIAGNOSIS — E876 Hypokalemia: Secondary | ICD-10-CM

## 2018-03-13 DIAGNOSIS — E119 Type 2 diabetes mellitus without complications: Secondary | ICD-10-CM

## 2018-03-13 LAB — BASIC METABOLIC PANEL
Anion gap: 9 (ref 5–15)
BUN: 9 mg/dL (ref 8–23)
CO2: 23 mmol/L (ref 22–32)
Calcium: 7.6 mg/dL — ABNORMAL LOW (ref 8.9–10.3)
Chloride: 102 mmol/L (ref 98–111)
Creatinine, Ser: 0.99 mg/dL (ref 0.44–1.00)
GFR calc Af Amer: >60 mL/min (ref >60)
GFR calc non Af Amer: 57 mL/min — ABNORMAL LOW (ref >60)
Glucose, Bld: 150 mg/dL — ABNORMAL HIGH (ref 70–99)
Potassium: 3.5 mmol/L (ref 3.5–5.1)
Sodium: 134 mmol/L — ABNORMAL LOW (ref 135–145)

## 2018-03-13 LAB — CULTURE, BLOOD (ROUTINE X 2): Culture: NO GROWTH

## 2018-03-13 LAB — CBC
HCT: 30.3 % — ABNORMAL LOW (ref 36.0–46.0)
Hemoglobin: 9.2 g/dL — ABNORMAL LOW (ref 12.0–15.0)
MCH: 27.2 pg (ref 26.0–34.0)
MCHC: 30.4 g/dL (ref 30.0–36.0)
MCV: 89.6 fL (ref 80.0–100.0)
Platelets: 192 10*3/uL (ref 150–400)
RBC: 3.38 MIL/uL — ABNORMAL LOW (ref 3.87–5.11)
RDW: 14.5 % (ref 11.5–15.5)
WBC: 9.9 10*3/uL (ref 4.0–10.5)
nRBC: 0 % (ref 0.0–0.2)

## 2018-03-13 LAB — VANCOMYCIN, TROUGH: Vancomycin Tr: 12 ug/mL — ABNORMAL LOW (ref 15–20)

## 2018-03-13 LAB — GLUCOSE, CAPILLARY
Glucose-Capillary: 146 mg/dL — ABNORMAL HIGH (ref 70–99)
Glucose-Capillary: 167 mg/dL — ABNORMAL HIGH (ref 70–99)
Glucose-Capillary: 187 mg/dL — ABNORMAL HIGH (ref 70–99)
Glucose-Capillary: 162 mg/dL — ABNORMAL HIGH (ref 70–99)

## 2018-03-13 MED ORDER — VANCOMYCIN HCL IN DEXTROSE 1-5 GM/200ML-% IV SOLN
1000.0000 mg | Freq: Two times a day (BID) | INTRAVENOUS | Status: DC
  Administered 2018-03-13 – 2018-03-15 (×4): 1000 mg via INTRAVENOUS
  Filled 2018-03-13 (×6): qty 200

## 2018-03-13 MED ORDER — INSULIN GLARGINE 100 UNIT/ML ~~LOC~~ SOLN
10.0000 [IU] | Freq: Two times a day (BID) | SUBCUTANEOUS | Status: DC
  Administered 2018-03-13 – 2018-03-15 (×5): 10 [IU] via SUBCUTANEOUS
  Filled 2018-03-13 (×6): qty 0.1

## 2018-03-13 MED ORDER — INSULIN GLARGINE 100 UNIT/ML ~~LOC~~ SOLN
15.0000 [IU] | Freq: Two times a day (BID) | SUBCUTANEOUS | Status: DC
  Filled 2018-03-13: qty 0.15

## 2018-03-13 NOTE — Progress Notes (Signed)
Family Medicine Teaching Service Daily Progress Note Intern Pager: [phone]  Patient name: [name] [name] Medical record number: [id number] Date of birth: 03-20-1951 Age: 67 y.o. Gender: female  Primary Care Provider: [name], DO Consultants: Surgery, PT/OT, ID, WOC (signed off), Cardiology Code Status: Full  Pt Overview and Major Events to Date:  11/19 - admitted for sepsis secondary to perianal abscess, altered mental status  Assessment and Plan:[name] [name] is a 67 year old female presenting with generalized weakness and fatigue found to have sepsis due to perianal abscess. PMHsignificant for T2DMdiabetes with gastroparesis and peripheral neuropathy, HTN, CAD with stable angina,HFpEF, hyperlipidemia, major depression with psychotic features, anxiety,andfibromyalgia.  Sepsis secondary to perirectal abscess now s/p I&D: Improving. I&D on 11/20.  Continues to be intermittently febrile, T-max 101 overnight, otherwise VSS. No further leukocytosis, 9.9 this am. LA wnl. Blood cultures negative at 1 day, wound culture showing mixed anaerobic flora (gram pos and neg rods, few gram pos cocci). Urine no growth.  -General surgery on board, recommend continued Penrose and sitz baths- f/u outpatient  -ID also following, added clindamycin to antibiotic regimen -Cont IV vancomycin, zosyn in additional to above  -Obtain vancomycin trough today -PT/OT when able  -KVO fluids  -Monitor BC, wound culture- call lab for further ID results  -Tylenol PRN fever  -Oxycodone 5 q6 for rectal pain   Altered mental status: Improved.  Slow to respond, but A&O x3 this am, responds to questions appropriately. Likely related to sepsis, will continue to montior.  -Holding sedating home medications  Acute urinary retention: Unchanged.  Patient with urethral catheter in place since 11/21, unable to void with pure wick. Will keep catheter in place given severity of illness and rectal wound.  -Monitor  for voiding trial   Acute hypoxemia: Resolving Now on 3L, satting 92-94% overnight, however poor reading on pulse oximetry, able to reduce to RA satting well this morning. Question if true hypoxemia prior to this. Lungs clear on exam, no increased WOB. CXR on 11/21 with interstitial coarsening. Could consider atelectasis, pneumonia, vs pulmonary edema, however already on vancomycin, clindamycin, and zosyn.   -Monitor oxygen requirements   T2DMwith gastroparesis and peripheral neuropathy: Chronic.  CBG ranging 96-180s, average 140. Required 3 units of novolog yesterday.  -mSSI -Decrease to lantus 10 units BID, monitor glucose closely  -CBGsACHS  Chronic hypertension: Stable.  Last BP 145/57, generally SBP 120-130's overnight, however low diastolic. Will ensure appropriate cuff placement, falling down on wrist this morning.  -Cont to monitor BP  -Hold home spironolactone and losartan -Hold home carvedilol 25mg  BID and amlodipine 10mg  in setting of waxing/waning mental status   EKG Changes: Resolved.  EKG on 11/19 with ST depressions and inverted T waves. Troponins trended flat. showingST segment depressions and inverted T waves were noticed.Troponins 0.19>0.18>0.18. Low risk Myoview stress test on 11/20. EKG sinus with PACs on 11/22.  -Cardiology signed off with no further cardiac testing at this time, may follow up outpatient  -Cont to monitor for CP -CM   AKI: Resolved.  Cr 0.99 this am, GFR >60. Baseline around 0.8.  -Monitor BMP  -Avoid nephrotoxic medications as possible.   UTI: Resolved.  -Monitor for symptoms   Hypokalemia:Resolved.  -Monitor BMP  Hypomagnesemia: Resolved.   Depression with psychotic features:Stable.  -Hydroxyzine 25 mg 3 times daily as needed anxiety, itching -discontinued Seroquel d/t AMS  Tobacco use disorder: Stable  -Nicotine patch 14 mg  Allergies: Stable  -Claritin10mg daily -Flonase  -Prednisolone eyedrops twice daily PRN    FEN/GI:KVO  NS, heart healthy/carb mod diet  YWV:PXTGGYI  Disposition: Continue medical management, consideration for transfer to step down if worsening   Subjective:  Patient is doing well morning, however tearful that she continues to have fevers. She denies any SOB, chest pain, or abdominal pain. Endorses continued rectal pain, but tolerable with pain management on board.   Objective: Temp:  [98.7 F (37.1 C)-102.1 F (38.9 C)] 100.5 F (38.1 C) (11/23 0607) Pulse Rate:  [80-94] 82 (11/23 0508) Resp:  [13-24] 24 (11/22 1638) BP: (115-145)/(49-68) 145/57 (11/23 0508) SpO2:  [90 %-100 %] 94 % (11/23 0508)  Physical Exam:  General: Alert, NAD HEENT: NCAT, MM dry, oropharynx nonerythematous  Cardiac: RRR no m/g/r Lungs: Clear bilaterally, no increased WOB  Abdomen: soft, non-tender, non-distended, normoactive BS Msk: Moves all extremities spontaneously  Ext: Warm, dry, 2+ distal pulses, no edema  Derm: Rectal ulceration with penrose in place and overlying dressing, minimal drainage from region, pink granulation tissue present. No surrounding erythema.  Neuro: Slow to respond, but alert and oriented x3. EOMI. Responds appropriately to questions, follows commands.    Laboratory: Recent Labs  Lab 03/11/18 0917 03/12/18 0435 03/13/18 0438  WBC 11.8* 9.9 9.9  HGB 9.0* 8.5* 9.2*  HCT 30.5* 27.3* 30.3*  PLT 146* 149* 192   Recent Labs  Lab 03/09/18 0542 03/10/18 0116 03/11/18 0421 03/12/18 0435 03/13/18 0438  NA 136 137 137 137 134*  K 3.2* 3.4* 3.8 3.5 3.5  CL 104 107 107 106 102  CO2 24 23 22 23 23   BUN 13 12 14 11 9   CREATININE 1.09* 1.04* 1.10* 1.00 0.99  CALCIUM 7.7* 7.6* 7.1* 7.3* 7.6*  PROT 6.5 6.3*  --   --   --   BILITOT 0.7 0.6  --   --   --   ALKPHOS 56 57  --   --   --   ALT 10 11  --   --   --   AST 16 21  --   --   --   GLUCOSE 182* 125* 149* 96 150*   Imaging/Diagnostic Tests: Ct Abdomen Pelvis W Contrast  Result Date: 03/09/2018 CLINICAL  DATA:  Generalize weakness, nausea, and diarrhea for 3 days. History of abscess on the buttocks. EXAM: CT ABDOMEN AND PELVIS WITH CONTRAST TECHNIQUE: Multidetector CT imaging of the abdomen and pelvis was performed using the standard protocol following bolus administration of intravenous contrast. CONTRAST:  174mL ISOVUE-300 IOPAMIDOL (ISOVUE-300) INJECTION 61% COMPARISON:  MRI abdomen 01/17/2016. CT abdomen and pelvis 01/14/2016. FINDINGS: Lower chest: Dependent changes in the lung bases. Small esophageal hiatal hernia. Hepatobiliary: Diffuse fatty infiltration of the liver. No focal liver abnormality is seen. Status post cholecystectomy. No biliary dilatation. Pancreas: Unremarkable. No pancreatic ductal dilatation or surrounding inflammatory changes. Spleen: Normal in size without focal abnormality. Adrenals/Urinary Tract: Adrenal glands are unremarkable. Kidneys are normal, without renal calculi, focal lesion, or hydronephrosis. Bladder is unremarkable. Stomach/Bowel: Stomach, small bowel, and colon are not abnormally distended. No wall thickening is appreciated. Gas and fluid collection in the right perirectal space measuring up to about 2.1 cm diameter and extending to just above the levator ani muscles. Prominent soft tissue gas demonstrated in the perianal region and extending inferiorly to the perineal fat bilaterally. This could be due to bowel fistula or infection from gas-forming organism. Vascular/Lymphatic: Aortic atherosclerosis. No enlarged abdominal or pelvic lymph nodes. Reproductive: Status post hysterectomy. No adnexal masses. Other: No free air or free fluid in the abdomen. Anterior abdominal  wall hernia near the umbilicus containing fat. Musculoskeletal: No acute or significant osseous findings. IMPRESSION: 1. Right perianal abscess extending to just above the levator ani muscles. Prominent soft tissue gas in the perianal region and extending inferiorly to the perineal fat bilaterally. This  could be due to bowel fistula or soft tissue infection from gas-forming organism. 2. Diffuse fatty infiltration of the liver. 3. Small esophageal hiatal hernia. Small anterior abdominal wall hernia containing fat. Aortic Atherosclerosis (ICD10-I70.0). Electronically Signed   By: Lucienne Capers M.D.   On: 03/09/2018 01:04   Nm Myocar Multi W/spect W/wall Motion / Ef  Result Date: 03/10/2018 CLINICAL DATA:  Heart murmur.  Diabetes.  CHF. EXAM: MYOCARDIAL IMAGING WITH SPECT (REST AND PHARMACOLOGIC-STRESS) GATED LEFT VENTRICULAR WALL MOTION STUDY LEFT VENTRICULAR EJECTION FRACTION TECHNIQUE: Standard myocardial SPECT imaging was performed after resting intravenous injection of 10 mCi Tc-34m tetrofosmin. Subsequently, intravenous infusion of Lexiscan was performed under the supervision of the Cardiology staff. At peak effect of the drug, 30 mCi Tc-74m tetrofosmin was injected intravenously and standard myocardial SPECT imaging was performed. Quantitative gated imaging was also performed to evaluate left ventricular wall motion, and estimate left ventricular ejection fraction. COMPARISON:  Chest radiograph of 04/03/2017 FINDINGS: Perfusion: No decreased activity in the left ventricle on stress imaging to suggest reversible ischemia or infarction. Wall Motion: Left ventricular free wall hypokinesis. Left Ventricular Ejection Fraction: 58 % End diastolic volume 89 ml End systolic volume 38 ml IMPRESSION: 1. No reversible ischemia or infarction. 2. Left ventricular free wall hypokinesis. 3. Left ventricular ejection fraction 58% 4. Non invasive risk stratification*: Low *2012 Appropriate Use Criteria for Coronary Revascularization Focused Update: J Am Coll Cardiol. 4315;40(0):867-619. http://content.airportbarriers.com.aspx?articleid=[email or web address] Electronically Signed   By: Abigail Miyamoto M.D.   On: 03/10/2018 10:59   Dg Chest Port 1 View  Result Date: 03/11/2018 CLINICAL DATA:  Postoperative fever EXAM: PORTABLE  CHEST 1 VIEW COMPARISON:  04/03/2017 FINDINGS: Low volume chest with coarse interstitial opacities on both sides. There could also be airspace disease. Cardiomegaly accentuated by low volumes. No edema, effusion, or pneumothorax. IMPRESSION: Interstitial coarsening which could be atelectasis, aspiration, or early infection. Electronically Signed   By: Monte Fantasia M.D.   On: 03/11/2018 07:33   Patriciaann Clan, DO 03/13/2018, 8:02 AM PGY-1, [facility] Intern pager: [phone], text pages welcome

## 2018-03-13 NOTE — Progress Notes (Signed)
Pharmacy Antibiotic Note  [name] [name] is a 67 y.o. female admitted on 03/08/2018 with perianal abcess.  Pharmacy has been consulted for vancomycin dosing. Pt now s/p I&D 11/20.   On day #6 of antibiotics - vanc/zosyn/clindamycin. Vancomycin trough approximately 11 hours after last dose came back at 12. WBC WNL. Scr stable at 0.99 (CrCl ~60 mL/min). Tmax/24 hrs is 100.5.  Plan: -Continue clindamycin 600 mg IV q8 hours for 2 total days per ID -Adjust vancomycin to 1000 mg IV q12h -Vancomycin trough a new Css -Continue zosyn 3.375 g IV every 8 hours -Monitor clinical pic, cx results, and duration of therapy  Height: 5\' 3"  (160 cm) Weight: 214 lb 15.2 oz (97.5 kg) IBW/kg (Calculated) : 52.4  Temp (24hrs), Avg:100.2 F (37.9 C), Min:98.7 F (37.1 C), Max:102.1 F (38.9 C)  Recent Labs  Lab 03/08/18 2315 03/09/18 0113 03/09/18 0542 03/10/18 0116 03/11/18 0421 03/11/18 0917 03/11/18 1604 03/11/18 1824 03/12/18 0435 03/13/18 0438 03/13/18 1136  WBC  --   --  14.7* 13.3*  --  11.8*  --   --  9.9 9.9  --   CREATININE  --   --  1.09* 1.04* 1.10*  --   --   --  1.00 0.99  --   LATICACIDVEN 3.42* 0.92  --   --   --   --  1.4 0.9  --   --   --   VANCOTROUGH  --   --   --   --   --   --   --   --   --   --  12*    Estimated Creatinine Clearance: 59.6 mL/min (by C-G formula based on SCr of 0.99 mg/dL).    Allergies  Allergen Reactions  . Desvenlafaxine Other (See Comments)    REACTION: dysphoria/dellusional  . Duloxetine Nausea And Vomiting and Other (See Comments)    REACTION: "wheezing" and tremors, dysphoria  . Gabapentin Other (See Comments)    Itching, felt crawling sensation inside  . Hydrocodone Itching  . Latex Itching    Denies airway, lung involvement  . Tramadol Itching  . Levofloxacin Other (See Comments)    Shortness of breath with headache  . Lithium Rash    Antimicrobials this admission: Vancomycin 11/19>> Rocephin 11/19>> 11/20 Zosyn 11/21  >> Clindamycin 11/22>>  VT 12 (11 hours after last dose) - increase to 1 g IV every 12 hours  Microbiology results: 11/20 Abscess: few GPC, mod GNR 11/18 MRSA nasal - neg 11/18 Bcx - NGTD   Thank you for allowing pharmacy to be a part of this patient's care.  Antonietta Jewel, PharmD Clinical Pharmacist  Pager: [phone] Phone: [phone] Please check AMION for all [facility] numbers 03/13/2018

## 2018-03-13 NOTE — Progress Notes (Signed)
Patient ID: [name] [name], female   DOB: 02/10/1951, 67 y.o.   MRN: [id number]    3 Days Post-Op  Subjective: Patient still with some fevers overnight.  Up to 101 this am.  RN reports penrose "fell out" overnight.  Patient doesn't really talk to me.  Objective: Vital signs in last 24 hours: Temp:  [98.7 F (37.1 C)-102.1 F (38.9 C)] 100.5 F (38.1 C) (11/23 0607) Pulse Rate:  [80-94] 82 (11/23 0508) Resp:  [13-24] 24 (11/22 1638) BP: (115-145)/(49-68) 145/57 (11/23 0508) SpO2:  [90 %-100 %] 94 % (11/23 0508) Last BM Date: 03/12/18  Intake/Output from previous day: 11/22 0701 - 11/23 0700 In: 847.7 [P.O.:120; I.V.:244.2; IV Piggyback:483.6] Out: 750 [Urine:750] Intake/Output this shift: No intake/output data recorded.  PE: Gen: NAD, laying on her left hip trying to eating breakfast Skin: perirectal wound is mostly clean.  Adequately draining.  Pen rose replaced with no issues.  Still tender around wound as expected.  Minimal induration.    Lab Results:  Recent Labs    03/12/18 0435 03/13/18 0438  WBC 9.9 9.9  HGB 8.5* 9.2*  HCT 27.3* 30.3*  PLT 149* 192   BMET Recent Labs    03/12/18 0435 03/13/18 0438  NA 137 134*  K 3.5 3.5  CL 106 102  CO2 23 23  GLUCOSE 96 150*  BUN 11 9  CREATININE 1.00 0.99  CALCIUM 7.3* 7.6*   PT/INR No results for input(s): LABPROT, INR in the last 72 hours. CMP     Component Value Date/Time   NA 134 (L) 03/13/2018 0438   NA 142 12/02/2017 1119   K 3.5 03/13/2018 0438   CL 102 03/13/2018 0438   CO2 23 03/13/2018 0438   GLUCOSE 150 (H) 03/13/2018 0438   BUN 9 03/13/2018 0438   BUN 8 12/02/2017 1119   CREATININE 0.99 03/13/2018 0438   CREATININE 0.75 03/19/2016 1444   CALCIUM 7.6 (L) 03/13/2018 0438   PROT 6.3 (L) 03/10/2018 0116   PROT 7.3 12/02/2017 1119   ALBUMIN 2.5 (L) 03/10/2018 0116   ALBUMIN 4.3 12/02/2017 1119   AST 21 03/10/2018 0116   ALT 11 03/10/2018 0116   ALKPHOS 57 03/10/2018 0116   BILITOT 0.6  03/10/2018 0116   BILITOT 0.4 12/02/2017 1119   GFRNONAA 57 (L) 03/13/2018 0438   GFRNONAA 83 03/19/2016 1444   GFRAA >60 03/13/2018 0438   GFRAA >89 03/19/2016 1444   Lipase     Component Value Date/Time   LIPASE 22 12/02/2017 1119       Studies/Results: No results found.  Anti-infectives: Anti-infectives (From admission, onward)   Start     Dose/Rate Route Frequency Ordered Stop   03/12/18 1800  clindamycin (CLEOCIN) IVPB 600 mg     600 mg 100 mL/hr over 30 Minutes Intravenous Every 8 hours 03/12/18 1629 03/14/18 1759   03/11/18 1300  vancomycin (VANCOCIN) IVPB 750 mg/150 ml premix     750 mg 150 mL/hr over 60 Minutes Intravenous Every 12 hours 03/11/18 1043     03/11/18 1130  piperacillin-tazobactam (ZOSYN) IVPB 3.375 g     3.375 g 12.5 mL/hr over 240 Minutes Intravenous Every 8 hours 03/11/18 1121     03/10/18 2000  cefTRIAXone (ROCEPHIN) 1 g in sodium chloride 0.9 % 100 mL IVPB  Status:  Discontinued     1 g 200 mL/hr over 30 Minutes Intravenous Every 24 hours 03/10/18 1704 03/11/18 1116   03/10/18 1215  cefTRIAXone (ROCEPHIN) 1 g in sodium  chloride 0.9 % 100 mL IVPB     1 g 200 mL/hr over 30 Minutes Intravenous  Once 03/10/18 1208 03/10/18 1415   03/10/18 0100  vancomycin (VANCOCIN) 1,250 mg in sodium chloride 0.9 % 250 mL IVPB  Status:  Discontinued     1,250 mg 166.7 mL/hr over 90 Minutes Intravenous Every 24 hours 03/09/18 0112 03/11/18 1043   03/09/18 0800  erythromycin (E-MYCIN) tablet 250 mg  Status:  Discontinued     250 mg Oral 3 times daily with meals & bedtime 03/09/18 0353 03/09/18 2056   03/09/18 0100  vancomycin (VANCOCIN) 1,750 mg in sodium chloride 0.9 % 500 mL IVPB     1,750 mg 250 mL/hr over 120 Minutes Intravenous  Once 03/09/18 0046 03/09/18 0357   03/08/18 2300  cefTRIAXone (ROCEPHIN) 1 g in sodium chloride 0.9 % 100 mL IVPB     1 g 200 mL/hr over 30 Minutes Intravenous  Once 03/08/18 2247 03/09/18 0030        Assessment/Plan HTN DM-II Depression  Tobacco abuse Abnormal EKG  Right perirectal abscess S/pIncision and drainage of right perirectal abscess (3 x 8 cm)11/20 Dr. [name] - POD 3 - culture reveals multiple organisms.  On  - penrose drain in place now, if this falls out then it just needs to be pushed back in. This is way easier for patients than placing packing and less painful.  She needs to get up to the bathroom and start sitz bathes today to help continue to clean her wound up. -abscess is adequately drained at this time with no further surgical needs. Oral candida -magic mouthwash TID.   ID -currently zosyn 11/21>>, vancomycin 11/19>> Clinda 11/22 FEN -HH diet VTE -SCDs, ok for chemical DVT prophylaxis from surgical standpoint Foley -in place  Plan-  patient should start sitz bathes today. Bowel regimen to avoid constipation.    LOS: 4 days    Henreitta Cea , [facility] 03/13/2018, 8:26 AM Pager: [phone]

## 2018-03-13 NOTE — Progress Notes (Signed)
Pt able to ambulate short distance in hall with one person assistance and rolling walker, sat in chair for one hour without difficulty.

## 2018-03-14 LAB — BASIC METABOLIC PANEL
Anion gap: 9 (ref 5–15)
BUN: 6 mg/dL — ABNORMAL LOW (ref 8–23)
CO2: 27 mmol/L (ref 22–32)
Calcium: 8.1 mg/dL — ABNORMAL LOW (ref 8.9–10.3)
Chloride: 102 mmol/L (ref 98–111)
Creatinine, Ser: 1 mg/dL (ref 0.44–1.00)
GFR calc Af Amer: >60 mL/min (ref >60)
GFR calc non Af Amer: 56 mL/min — ABNORMAL LOW (ref >60)
Glucose, Bld: 134 mg/dL — ABNORMAL HIGH (ref 70–99)
Potassium: 3 mmol/L — ABNORMAL LOW (ref 3.5–5.1)
Sodium: 138 mmol/L (ref 135–145)

## 2018-03-14 LAB — CBC
HCT: 29.9 % — ABNORMAL LOW (ref 36.0–46.0)
Hemoglobin: 9.5 g/dL — ABNORMAL LOW (ref 12.0–15.0)
MCH: 27.9 pg (ref 26.0–34.0)
MCHC: 31.8 g/dL (ref 30.0–36.0)
MCV: 87.7 fL (ref 80.0–100.0)
Platelets: 224 10*3/uL (ref 150–400)
RBC: 3.41 MIL/uL — ABNORMAL LOW (ref 3.87–5.11)
RDW: 14.2 % (ref 11.5–15.5)
WBC: 11 10*3/uL — ABNORMAL HIGH (ref 4.0–10.5)
nRBC: 0.2 % (ref 0.0–0.2)

## 2018-03-14 LAB — GLUCOSE, CAPILLARY
Glucose-Capillary: 119 mg/dL — ABNORMAL HIGH (ref 70–99)
Glucose-Capillary: 149 mg/dL — ABNORMAL HIGH (ref 70–99)
Glucose-Capillary: 166 mg/dL — ABNORMAL HIGH (ref 70–99)
Glucose-Capillary: 132 mg/dL — ABNORMAL HIGH (ref 70–99)

## 2018-03-14 LAB — CULTURE, BLOOD (ROUTINE X 2): Culture: NO GROWTH

## 2018-03-14 MED ORDER — FLUOROMETHOLONE 0.1 % OP SUSP
1.0000 [drp] | Freq: Four times a day (QID) | OPHTHALMIC | Status: DC
  Administered 2018-03-14 – 2018-03-15 (×3): 1 [drp] via OPHTHALMIC
  Filled 2018-03-14: qty 5

## 2018-03-14 MED ORDER — POTASSIUM CHLORIDE CRYS ER 20 MEQ PO TBCR
40.0000 meq | EXTENDED_RELEASE_TABLET | Freq: Two times a day (BID) | ORAL | Status: AC
  Administered 2018-03-14 (×2): 40 meq via ORAL
  Filled 2018-03-14 (×2): qty 2

## 2018-03-14 MED ORDER — CARVEDILOL 12.5 MG PO TABS
12.5000 mg | ORAL_TABLET | Freq: Two times a day (BID) | ORAL | Status: DC
  Administered 2018-03-14 – 2018-03-15 (×2): 12.5 mg via ORAL
  Filled 2018-03-14 (×2): qty 1

## 2018-03-14 MED ORDER — OLOPATADINE HCL 0.1 % OP SOLN
1.0000 [drp] | Freq: Two times a day (BID) | OPHTHALMIC | Status: DC
  Administered 2018-03-14 – 2018-03-15 (×3): 1 [drp] via OPHTHALMIC
  Filled 2018-03-14: qty 5

## 2018-03-14 NOTE — Progress Notes (Signed)
Assessment & Plan: Right perirectal abscess S/pIncision and drainage of right perirectal abscess (3 x 8 cm)11/20 Dr. [name] - POD#4 - abscess is adequately drained at this time with no need of further surgical intervention - WBC 11, afebrile - recommend Sitz bath or use of hand-held shower to wound 3X daily until wound is healed - will arrange follow up at [place] office with Dr. [name] for one week after discharge - recommend oral abx's upon discharge for one week - bowel regimen to avoid constipation  Surgery will sign off.  Call if needed.  Office will contact patient to schedule follow up.         Armandina Gemma, MD       [facility]       Office: [phone]   Chief Complaint: Peri-rectal abscess  Subjective: Patient in bed.  Anxious.  Examined with nurse at bedside.  Patient wants to go home.  Objective: Vital signs in last 24 hours: Temp:  [98.5 F (36.9 C)-100.8 F (38.2 C)] 98.5 F (36.9 C) (11/24 0750) Pulse Rate:  [76-88] 76 (11/24 0750) Resp:  [14-15] 15 (11/24 0750) BP: (123-162)/(49-70) 154/60 (11/24 0750) SpO2:  [90 %-100 %] 100 % (11/24 0750) Weight:  [98.9 kg] 98.9 kg (11/24 0441) Last BM Date: 03/13/18  Intake/Output from previous day: 11/23 0701 - 11/24 0700 In: 2110.6 [P.O.:600; I.V.:124.8; IV Piggyback:1385.9] Out: 800 [Urine:800] Intake/Output this shift: Total I/O In: -  Out: 2200 [Urine:2200]  Physical Exam: HEENT - sclerae clear, mucous membranes moist Neck - soft GU - open wound right medial buttock with penrose drain in place secured by suture; small drainage on skin; mild induration anterior buttock, otherwise soft; no cellulitis   Lab Results:  Recent Labs    03/13/18 0438 03/14/18 0555  WBC 9.9 11.0*  HGB 9.2* 9.5*  HCT 30.3* 29.9*  PLT 192 224   BMET Recent Labs    03/13/18 0438 03/14/18 0555  NA 134* 138  K 3.5 3.0*  CL 102 102  CO2 23 27  GLUCOSE 150* 134*  BUN 9 6*  CREATININE 0.99 1.00    CALCIUM 7.6* 8.1*   PT/INR No results for input(s): LABPROT, INR in the last 72 hours. Comprehensive Metabolic Panel:    Component Value Date/Time   NA 138 03/14/2018 0555   NA 134 (L) 03/13/2018 0438   NA 142 12/02/2017 1119   NA 140 01/22/2017 1057   K 3.0 (L) 03/14/2018 0555   K 3.5 03/13/2018 0438   CL 102 03/14/2018 0555   CL 102 03/13/2018 0438   CO2 27 03/14/2018 0555   CO2 23 03/13/2018 0438   BUN 6 (L) 03/14/2018 0555   BUN 9 03/13/2018 0438   BUN 8 12/02/2017 1119   BUN 8 01/22/2017 1057   CREATININE 1.00 03/14/2018 0555   CREATININE 0.99 03/13/2018 0438   CREATININE 0.75 03/19/2016 1444   CREATININE 0.78 12/31/2015 1213   GLUCOSE 134 (H) 03/14/2018 0555   GLUCOSE 150 (H) 03/13/2018 0438   CALCIUM 8.1 (L) 03/14/2018 0555   CALCIUM 7.6 (L) 03/13/2018 0438   AST 21 03/10/2018 0116   AST 16 03/09/2018 0542   ALT 11 03/10/2018 0116   ALT 10 03/09/2018 0542   ALKPHOS 57 03/10/2018 0116   ALKPHOS 56 03/09/2018 0542   BILITOT 0.6 03/10/2018 0116   BILITOT 0.7 03/09/2018 0542   BILITOT 0.4 12/02/2017 1119   PROT 6.3 (L) 03/10/2018 0116   PROT 6.5 03/09/2018 0542  PROT 7.3 12/02/2017 1119   ALBUMIN 2.5 (L) 03/10/2018 0116   ALBUMIN 2.9 (L) 03/09/2018 0542   ALBUMIN 4.3 12/02/2017 1119    Studies/Results: No results found.    [name] [name] 03/14/2018  Patient ID: [name] [name], female   DOB: 06-05-1950, 67 y.o.   MRN: [id number]

## 2018-03-14 NOTE — Progress Notes (Signed)
Patient has not urinated since catheter removed at 1315hrs, states she feels like she needs to go, OOB on BSC, unable to urinate.  Bladder scan done 792ccurine noted.  Straight cath done for 600cc of amber clear urine. Patient states she feels better.

## 2018-03-14 NOTE — Progress Notes (Signed)
Family Medicine Teaching Service Daily Progress Note Intern Pager: [phone]  Patient name: [name] [name] Medical record number: [id number] Date of birth: 08/21/1950 Age: 67 y.o. Gender: female  Primary Care Provider: [name], DO Consultants: Surgery, PT/OT, ID, WOC (signed off), Cardiology Code Status: Full  Pt Overview and Major Events to Date:  11/19 - admitted for sepsis secondary to perianal abscess, altered mental status  Assessment and Plan: [name] [name] is a 67 year old female presenting with generalized weakness and fatigue found to have sepsis due to perianal abscess. PMHsignificant for T2DMdiabetes with gastroparesis and peripheral neuropathy, HTN, CAD with stable angina,HFpEF, hyperlipidemia, major depression with psychotic features, anxiety,andfibromyalgia.  Sepsis secondary to perirectal abscess now s/p I&D:  Improving. I&D on 11/20.  Continues to be intermittently febrile, T-max 100.8 overnight, otherwise VSS. Leukocytosis 9.9>> 11.0. Blood cultures NGTD (Day 2), wound culture showing mixed anaerobic flora (gram pos and neg rods, few gram pos cocci). Vanc trough subtherapeutic 12. Vanc dose adjusted on 11/23. Surgery signed off.   -ID also following, added clindamycin to antibiotic regimen recommended wick instead of penrose for wound packing. ID Will see on 11/25.   -Cont IV vancomycin, zosyn in additional to above  -PT/OT when able  -KVO fluids  -Monitor BC, wound culture- call lab for further ID results  -Tylenol PRN fever  -Oxycodone 5 q6 for rectal pain   Altered mental status: Improved.  Mental status continue to improve this morning, A&O x3 this am, responds to questions appropriately. Likely related to sepsis, will continue to montior.  -Holding sedating home medications  Acute urinary retention: Unchanged.  Patient with urethral catheter in place since 11/21, unable to void with pure wick. Will keep catheter in place given severity of illness  and rectal wound.  -Consider voiding trial given improving clinical picture   Acute hypoxemia: Resolving Now on RA, satting 100%. Desat in the upper 80's overnight.  Lungs clear on exam, no increased WOB. CXR on 11/21 with interstitial coarsening. Could consider atelectasis, pneumonia, vs pulmonary edema, however already on vancomycin, clindamycin, and zosyn.   -Monitor oxygen requirements   T2DMwith gastroparesis and peripheral neuropathy: Chronic.  CBG ranging 96-180s, average 140. Required 3 units of novolog yesterday. This am FBG 119. -mSSI -Decrease to lantus 10 units BID, monitor glucose closely  -CBGsACHS  Chronic hypertension: Stable.   BP this am 154/60, slightly higher than her normal, suspect pain is contributing this morning. Could resume home BP meds with improved mental status -Hold home spironolactone and losartan -Hold home carvedilol 25mg  BID and amlodipine 10mg  in setting of waxing/waning mental status   EKG Changes: Resolved.  EKG on 11/19 with ST depressions and inverted T waves. Troponins trended flat. showingST segment depressions and inverted T waves were noticed.Troponins 0.19>0.18>0.18. Low risk Myoview stress test on 11/20. EKG sinus with PACs on 11/22.  -Cardiology signed off with no further cardiac testing at this time, may follow up outpatient  -Cont to monitor for CP -CM   AKI: Resolved.  Cr 1.00 this am, GFR >60. Baseline around 0.8.  -Monitor BMP  -Avoid nephrotoxic medications as possible.   UTI: Resolved.  -Monitor for symptoms   Hypokalemia K+ 3.0 this am. Will replete --Kdur 40 mEq x2 --Follow up on am BMP   Hypomagnesemia: Resolved.   Depression with psychotic features:Stable.  -Hydroxyzine 25 mg 3 times daily as needed anxiety, itching -discontinued Seroquel d/t AMS  Tobacco use disorder: Stable  -Nicotine patch 14 mg  Allergies: Stable  -Claritin10mg daily -Flonase  -Prednisolone  eyedrops twice daily PRN    FEN/GI:KVO NS, heart healthy/carb mod diet  PYP:[id number]  Disposition: Continue medical management, consideration for transfer to step down if worsening   Subjective:  Patient is complaining of worsening pain this morning. She also states that she is having a hard time breathing through her nose because of congestion. She denies any chest pain, or abdominal pain. Patient ready for Sitz bath and eager to go home.  Objective: Temp:  [98.5 F (36.9 C)-100.8 F (38.2 C)] 98.5 F (36.9 C) (11/24 0750) Pulse Rate:  [30-88] 76 (11/24 0750) Resp:  [14-26] 15 (11/24 0750) BP: (123-162)/(49-70) 154/60 (11/24 0750) SpO2:  [71 %-100 %] 100 % (11/24 0750) Weight:  [98.9 kg] 98.9 kg (11/24 0441)  Physical Exam:  General: Alert, NAD HEENT: NCAT, MM dry, oropharynx nonerythematous  Cardiac: RRR no m/g/r Lungs: Clear bilaterally, no increased WOB  Abdomen: soft, non-tender, non-distended, normoactive BS Msk: Moves all extremities spontaneously  Ext: Warm, dry, 2+ distal pulses, no edema  Derm: Rectal ulceration with penrose in place and overlying dressing, minimal drainage from region, pink granulation tissue present. No surrounding erythema.  Neuro: Slow to respond, but alert and oriented x3. EOMI. Responds appropriately to questions, follows commands.    Laboratory: Recent Labs  Lab 03/12/18 0435 03/13/18 0438 03/14/18 0555  WBC 9.9 9.9 11.0*  HGB 8.5* 9.2* 9.5*  HCT 27.3* 30.3* 29.9*  PLT 149* 192 224   Recent Labs  Lab 03/09/18 0542 03/10/18 0116  03/12/18 0435 03/13/18 0438 03/14/18 0555  NA 136 137   < > 137 134* 138  K 3.2* 3.4*   < > 3.5 3.5 3.0*  CL 104 107   < > 106 102 102  CO2 24 23   < > 23 23 27   BUN 13 12   < > 11 9 6*  CREATININE 1.09* 1.04*   < > 1.00 0.99 1.00  CALCIUM 7.7* 7.6*   < > 7.3* 7.6* 8.1*  PROT 6.5 6.3*  --   --   --   --   BILITOT 0.7 0.6  --   --   --   --   ALKPHOS 56 57  --   --   --   --   ALT 10 11  --   --   --   --   AST 16 21  --    --   --   --   GLUCOSE 182* 125*   < > 96 150* 134*   < > = values in this interval not displayed.   Imaging/Diagnostic Tests: Ct Abdomen Pelvis W Contrast  Result Date: 03/09/2018 CLINICAL DATA:  Generalize weakness, nausea, and diarrhea for 3 days. History of abscess on the buttocks. EXAM: CT ABDOMEN AND PELVIS WITH CONTRAST TECHNIQUE: Multidetector CT imaging of the abdomen and pelvis was performed using the standard protocol following bolus administration of intravenous contrast. CONTRAST:  174mL ISOVUE-300 IOPAMIDOL (ISOVUE-300) INJECTION 61% COMPARISON:  MRI abdomen 01/17/2016. CT abdomen and pelvis 01/14/2016. FINDINGS: Lower chest: Dependent changes in the lung bases. Small esophageal hiatal hernia. Hepatobiliary: Diffuse fatty infiltration of the liver. No focal liver abnormality is seen. Status post cholecystectomy. No biliary dilatation. Pancreas: Unremarkable. No pancreatic ductal dilatation or surrounding inflammatory changes. Spleen: Normal in size without focal abnormality. Adrenals/Urinary Tract: Adrenal glands are unremarkable. Kidneys are normal, without renal calculi, focal lesion, or hydronephrosis. Bladder is unremarkable. Stomach/Bowel: Stomach, small bowel, and colon are not abnormally distended. No wall thickening is  appreciated. Gas and fluid collection in the right perirectal space measuring up to about 2.1 cm diameter and extending to just above the levator ani muscles. Prominent soft tissue gas demonstrated in the perianal region and extending inferiorly to the perineal fat bilaterally. This could be due to bowel fistula or infection from gas-forming organism. Vascular/Lymphatic: Aortic atherosclerosis. No enlarged abdominal or pelvic lymph nodes. Reproductive: Status post hysterectomy. No adnexal masses. Other: No free air or free fluid in the abdomen. Anterior abdominal wall hernia near the umbilicus containing fat. Musculoskeletal: No acute or significant osseous findings.  IMPRESSION: 1. Right perianal abscess extending to just above the levator ani muscles. Prominent soft tissue gas in the perianal region and extending inferiorly to the perineal fat bilaterally. This could be due to bowel fistula or soft tissue infection from gas-forming organism. 2. Diffuse fatty infiltration of the liver. 3. Small esophageal hiatal hernia. Small anterior abdominal wall hernia containing fat. Aortic Atherosclerosis (ICD10-I70.0). Electronically Signed   By: Lucienne Capers M.D.   On: 03/09/2018 01:04   Nm Myocar Multi W/spect W/wall Motion / Ef  Result Date: 03/10/2018 CLINICAL DATA:  Heart murmur.  Diabetes.  CHF. EXAM: MYOCARDIAL IMAGING WITH SPECT (REST AND PHARMACOLOGIC-STRESS) GATED LEFT VENTRICULAR WALL MOTION STUDY LEFT VENTRICULAR EJECTION FRACTION TECHNIQUE: Standard myocardial SPECT imaging was performed after resting intravenous injection of 10 mCi Tc-67m tetrofosmin. Subsequently, intravenous infusion of Lexiscan was performed under the supervision of the Cardiology staff. At peak effect of the drug, 30 mCi Tc-58m tetrofosmin was injected intravenously and standard myocardial SPECT imaging was performed. Quantitative gated imaging was also performed to evaluate left ventricular wall motion, and estimate left ventricular ejection fraction. COMPARISON:  Chest radiograph of 04/03/2017 FINDINGS: Perfusion: No decreased activity in the left ventricle on stress imaging to suggest reversible ischemia or infarction. Wall Motion: Left ventricular free wall hypokinesis. Left Ventricular Ejection Fraction: 58 % End diastolic volume 89 ml End systolic volume 38 ml IMPRESSION: 1. No reversible ischemia or infarction. 2. Left ventricular free wall hypokinesis. 3. Left ventricular ejection fraction 58% 4. Non invasive risk stratification*: Low *2012 Appropriate Use Criteria for Coronary Revascularization Focused Update: J Am Coll Cardiol. 5400;86(7):619-509.  http://content.airportbarriers.com.aspx?articleid=[email or web address] Electronically Signed   By: Abigail Miyamoto M.D.   On: 03/10/2018 10:59   Dg Chest Port 1 View  Result Date: 03/11/2018 CLINICAL DATA:  Postoperative fever EXAM: PORTABLE CHEST 1 VIEW COMPARISON:  04/03/2017 FINDINGS: Low volume chest with coarse interstitial opacities on both sides. There could also be airspace disease. Cardiomegaly accentuated by low volumes. No edema, effusion, or pneumothorax. IMPRESSION: Interstitial coarsening which could be atelectasis, aspiration, or early infection. Electronically Signed   By: Monte Fantasia M.D.   On: 03/11/2018 07:33   Marjie Skiff, MD 03/14/2018, 8:32 AM PGY-3, [place] Intern pager: [phone], text pages welcome

## 2018-03-15 ENCOUNTER — Other Ambulatory Visit (INDEPENDENT_AMBULATORY_CARE_PROVIDER_SITE_OTHER): Payer: Self-pay | Admitting: Orthopaedic Surgery

## 2018-03-15 DIAGNOSIS — E1142 Type 2 diabetes mellitus with diabetic polyneuropathy: Secondary | ICD-10-CM

## 2018-03-15 DIAGNOSIS — R5082 Postprocedural fever: Secondary | ICD-10-CM

## 2018-03-15 LAB — CBC
HCT: 31.2 % — ABNORMAL LOW (ref 36.0–46.0)
Hemoglobin: 9.9 g/dL — ABNORMAL LOW (ref 12.0–15.0)
MCH: 27.5 pg (ref 26.0–34.0)
MCHC: 31.7 g/dL (ref 30.0–36.0)
MCV: 86.7 fL (ref 80.0–100.0)
Platelets: 274 10*3/uL (ref 150–400)
RBC: 3.6 MIL/uL — ABNORMAL LOW (ref 3.87–5.11)
RDW: 14 % (ref 11.5–15.5)
WBC: 11.1 10*3/uL — ABNORMAL HIGH (ref 4.0–10.5)
nRBC: 0.5 % — ABNORMAL HIGH (ref 0.0–0.2)

## 2018-03-15 LAB — AEROBIC/ANAEROBIC CULTURE W GRAM STAIN (SURGICAL/DEEP WOUND)

## 2018-03-15 LAB — BASIC METABOLIC PANEL
Anion gap: 8 (ref 5–15)
BUN: <5 mg/dL — ABNORMAL LOW (ref 8–23)
CO2: 28 mmol/L (ref 22–32)
Calcium: 8.4 mg/dL — ABNORMAL LOW (ref 8.9–10.3)
Chloride: 103 mmol/L (ref 98–111)
Creatinine, Ser: 0.9 mg/dL (ref 0.44–1.00)
GFR calc Af Amer: >60 mL/min (ref >60)
GFR calc non Af Amer: >60 mL/min (ref >60)
Glucose, Bld: 126 mg/dL — ABNORMAL HIGH (ref 70–99)
Potassium: 4 mmol/L (ref 3.5–5.1)
Sodium: 139 mmol/L (ref 135–145)

## 2018-03-15 LAB — GLUCOSE, CAPILLARY
Glucose-Capillary: 118 mg/dL — ABNORMAL HIGH (ref 70–99)
Glucose-Capillary: 121 mg/dL — ABNORMAL HIGH (ref 70–99)
Glucose-Capillary: 126 mg/dL — ABNORMAL HIGH (ref 70–99)

## 2018-03-15 LAB — VANCOMYCIN, TROUGH: Vancomycin Tr: 14 ug/mL — ABNORMAL LOW (ref 15–20)

## 2018-03-15 MED ORDER — AMOXICILLIN-POT CLAVULANATE 875-125 MG PO TABS: 1.0000 | ORAL_TABLET | Freq: Two times a day (BID) | ORAL | 0 refills | Status: AC

## 2018-03-15 MED ORDER — INSULIN GLARGINE 100 UNIT/ML SOLOSTAR PEN: PEN_INJECTOR | SUBCUTANEOUS | 0 refills | Status: DC

## 2018-03-15 MED ORDER — AMOXICILLIN-POT CLAVULANATE 875-125 MG PO TABS
1.0000 | ORAL_TABLET | Freq: Two times a day (BID) | ORAL | Status: DC
  Administered 2018-03-15: 1 via ORAL
  Filled 2018-03-15: qty 1

## 2018-03-15 MED ORDER — ACETAMINOPHEN 325 MG PO TABS: 650.0000 mg | ORAL_TABLET | ORAL | 0 refills | Status: DC | PRN

## 2018-03-15 NOTE — Progress Notes (Signed)
Pt unable to void, bladder scan showed 589. MD paged and informed and In and Out cath preformed w/600cc clear yellow urine obtained. Will continue to monitor. Jessie Foot, RN

## 2018-03-15 NOTE — Progress Notes (Signed)
Stitch of the penrose removed as ordered by Dr. Ouida Sills.  Sitz bath given.  Idolina Primer, RN

## 2018-03-15 NOTE — Discharge Instructions (Signed)
1. Please return to the Emergency Department if you experience worsening symptoms such as fevers or unrelenting pain at the surgical site. 2. You will take Augumentin (an antibiotic) through December 4th. Be sure to take ALL of these medications as prescribed, even if you start to feel better. 3. The surgeon's office will contact you to schedule a follow up appointment - please schedule this appointment with them and make sure to follow up with their recommendations.  3. Continue to use Sitz (Epsom salt) baths to keep the wound clean and encourage proper healing. 4. I would encourage you to hop in the shower and rinse your body off after each bowel movement.

## 2018-03-15 NOTE — Progress Notes (Signed)
Occupational Therapy Treatment Patient Details Name: [name] [name] MRN: [id number] DOB: 1951-01-05 Today's Date: 03/15/2018    History of present illness 67 y.o. female presenting with generalized weakness and fatigue, found to have sepsis due to perirectal abscess. She underwent I&D 03/10/18. PMH is significant for T2DM with gastroparesis and peripheral neuropathy, HTN, CAD with stable angina, HFpEF, HLD, major depression with psychotic features, anxiety, and fibromyalgia.    OT comments  All education is complete and patient indicates understanding.pt is at adequate level for d/c from OT standpoint. Pt with pain controlled and completed dressing independently.    Follow Up Recommendations  Home health OT    Equipment Recommendations  3 in 1 bedside commode    Recommendations for Other Services Other (comment)    Precautions / Restrictions Precautions Precautions: Fall Restrictions Weight Bearing Restrictions: No       Mobility Bed Mobility Overal bed mobility: Modified Independent Bed Mobility: Supine to Sit     Supine to sit: Min guard;HOB elevated     General bed mobility comments: Use of rail, no physical assist needed.   Transfers Overall transfer level: Modified independent Equipment used: Rolling walker (2 wheeled) Transfers: Sit to/from Stand Sit to Stand: Min guard         General transfer comment: pt not using DME for basic transfer but reaching for environmental supports. pt min cues for safety.     Balance Overall balance assessment: Needs assistance Sitting-balance support: Feet supported;No upper extremity supported Sitting balance-Leahy Scale: Good Sitting balance - Comments: Reaching outside BoS to grab phone on chair without LOB.   Standing balance support: During functional activity Standing balance-Leahy Scale: Fair Standing balance comment: Able to stand statically without UE support and reach into closet to get bag without LOB.                           ADL either performed or assessed with clinical judgement   ADL Overall ADL's : Modified independent                                       General ADL Comments: pt dressed for home. pt completed toilet transfer with peri care. pt completed bed mobility. pt without questions for sitz bath or any other adl at thist ime. pt is taking a taxi to sisters home per patient. pt requesting arm band removed. RN notified     Vision       Perception     Praxis      Cognition Arousal/Alertness: Awake/alert Behavior During Therapy: WFL for tasks assessed/performed Overall Cognitive Status: No family/caregiver present to determine baseline cognitive functioning                                 General Comments: Focused on going home, "I feel like a million bucks." Pt with poor memory.         Exercises     Shoulder Instructions       General Comments Pt sweating thruoghout session- reports this as baseline.    Pertinent Vitals/ Pain       Pain Assessment: Faces Faces Pain Scale: Hurts little more Pain Location: Rt buttocks Pain Descriptors / Indicators: Sore;Operative site guarding Pain Intervention(s): Monitored during session;Repositioned  Home Living  Prior Functioning/Environment              Frequency  Min 2X/week        Progress Toward Goals  OT Goals(current goals can now be found in the care plan section)  Progress towards OT goals: Goals met and updated - see care plan;Goals met/education completed, patient discharged from OT  Acute Rehab OT Goals Patient Stated Goal: home OT Goal Formulation: With patient Time For Goal Achievement: 03/26/18 Potential to Achieve Goals: Good  Plan Discharge plan remains appropriate    Co-evaluation                 AM-PAC OT "6 Clicks" Daily Activity     Outcome Measure   Help from another  person eating meals?: None Help from another person taking care of personal grooming?: None Help from another person toileting, which includes using toliet, bedpan, or urinal?: None Help from another person bathing (including washing, rinsing, drying)?: None Help from another person to put on and taking off regular upper body clothing?: None Help from another person to put on and taking off regular lower body clothing?: None 6 Click Score: 24    End of Session    OT Visit Diagnosis: Unsteadiness on feet (R26.81)   Activity Tolerance Patient tolerated treatment well   Patient Left in bed;with call bell/phone within reach   Nurse Communication Mobility status;Precautions        Time: 1341-1355 OT Time Calculation (min): 14 min  Charges: OT General Charges $OT Visit: 1 Visit OT Treatments $Self Care/Home Management : 8-22 mins   Jeri Modena, OTR/L  Acute Rehabilitation Services Pager: [phone] Office: [phone] .    Jeri Modena 03/15/2018, 3:12 PM

## 2018-03-15 NOTE — Care Management Note (Signed)
Case Management Note  Patient Details  Name: Fotini Lemus MRN: [id number] Date of Birth: 13-Jun-1950  Subjective/Objective:  Pt presented for generalized weakness and fatigue. PTA From home alone. Patient will go to sisters home at transition. Sister is Joana Reamer @ Address [place] [address].                    Action/Plan: Patient agreeable to [place] did discuss [place] orders and agencies. Offered choice and patient chose [facility] for Services. CM did make referral with [facility]. RW to be delivered to room prior to transition home. SOC to begin within 24-48 hours post transition home. Patient will utilize a cab for transition to sisters address. No further needs from CM at this time.   Expected Discharge Date:  03/15/18               Expected Discharge Plan:  [place]  In-House Referral:  NA  Discharge planning Services  CM Consult  Post Acute Care Choice:  Home Health Choice offered to:  Patient  DME Arranged:  [facility] rolling DME Agency:  [place] Arranged:  RN, Disease Management, PT, OT, Nurse's Aide [place] Agency:  [place]  Status of Service:  Completed, signed off  If discussed at [place] of Stay Meetings, dates discussed:    Additional Comments:  Bethena Roys, RN 03/15/2018, 1:28 PM

## 2018-03-15 NOTE — Discharge Summary (Signed)
[facility] Discharge Summary  Patient name: [name] [name] Medical record number: [id number] Date of birth: [birth year] Age: 67 y.o. Gender: female Date of Admission: 03/08/2018  Date of Discharge: 03/15/2018 Admitting Physician: Leeanne Rio, MD  Primary Care Provider: [name], DO Consultants: PT/OT, general surgeon, cardiology, infectious disease  Indication for Hospitalization: AMS secondary to sepsis, perianal abscess  Discharge Diagnoses/Problem List:  Type 2 Diabetes  Gastroparesis  Peripheral neuropathy Hypertension CAD with stable angina HFpEF HLD  MDD with psychotic features Anxiety Fibromyalgia  Disposition: Discharge home  Discharge Condition: Improved  Discharge Exam:  Physical Exam  Constitutional: She appears well-developed and well-nourished. No distress.  Cardiovascular: Normal rate, regular rhythm, normal heart sounds and intact distal pulses.  Pulmonary/Chest: Effort normal and breath sounds normal.  Abdominal: Soft. Bowel sounds are normal.  Genitourinary:  Genitourinary Comments: Wound to R peri-anal region clean with pinrose drain in place with single stitch, clean, non-draining  Musculoskeletal: She exhibits no edema or deformity.  Psychiatric:  The patient is very upset and requesting to leave from the hospital earlier than the medical team advises because she needs to pay bills.   Brief Hospital Course:  This is a patient admitted early on 03/09/2018 with altered mental status and was found to be septic due to a painful perianal abscess.  She was started on IV vancomycin and ceftriaxone.  Surgery was consulted to manage the abscess and she was taken for I&D on 03/09/2018.  While in the OR, she was placed on EKG and pronounced ST depression and inverted T waves were noticed.  She was asymptomatic at the time due to being sedated with Versed.  Repeat EKG confirmed the changes and cardiology was consulted.   Troponins were drawn and trended flat.  The patient developed chest pain that was reproducible with palpation.  Coronary angiography showed low risk with no reversible defects to suggest any ischemia in the patient was cleared for I&D on 03/10/2018.  The procedure was performed but the patient remained febrile.  Infectious disease was consulted and switched the antibiotics to IV vancomycin and Zosyn, discontinuing ceftriaxone. The patient's AMS improved and fevers stopped.  Towards the end of her hospitalization she expressed the need to leave the hospital in order to pay her mortgage bills.  Although the patient could have used another 24 hours of monitoring, her IV antibiotics were switched to Augmentin p.o., her penrose drain was removed, and she was discharged home with instructions and close outpatient follow-up on 03/15/2018.  Issues for Follow Up:  1. Patient instructed to return to the Emergency Department if you experience worsening symptoms such as fevers or unrelenting pain at the surgical site. 2. The patient will take Augmentin through December 4th.  3. The surgeon's office will contact the patient to schedule a follow up appointment. 3. Continue using Sitz/Epsom salt baths to keep the wound clean and encourage proper healing. 4. The patient was instructed to shower after each bowel movement.   Significant Procedures:  11/20 - Coronary angiography, I&D  Significant Labs and Imaging:  Recent Labs  Lab 03/13/18 0438 03/14/18 0555 03/15/18 0345  WBC 9.9 11.0* 11.1*  HGB 9.2* 9.5* 9.9*  HCT 30.3* 29.9* 31.2*  PLT 192 224 274   Recent Labs  Lab 03/09/18 0542 03/10/18 0116 03/11/18 0421 03/12/18 0435 03/13/18 0438 03/14/18 0555 03/15/18 0345  NA 136 137 137 137 134* 138 139  K 3.2* 3.4* 3.8 3.5 3.5 3.0* 4.0  CL 104 107 107  106 102 102 103  CO2 24 23 22 23 23 27 28   GLUCOSE 182* 125* 149* 96 150* 134* 126*  BUN 13 12 14 11 9  6* <5*  CREATININE 1.09* 1.04* 1.10* 1.00 0.99  1.00 0.90  CALCIUM 7.7* 7.6* 7.1* 7.3* 7.6* 8.1* 8.4*  MG  --  1.5* 1.7  --   --   --   --   PHOS  --  2.8  --   --   --   --   --   ALKPHOS 56 57  --   --   --   --   --   AST 16 21  --   --   --   --   --   ALT 10 11  --   --   --   --   --   ALBUMIN 2.9* 2.5*  --   --   --   --   --     Results/Tests Pending at Time of Discharge: None  Discharge Medications:  Allergies as of 03/15/2018      Reactions   Desvenlafaxine Other (See Comments)   REACTION: dysphoria/dellusional   Duloxetine Nausea And Vomiting, Other (See Comments)   REACTION: "wheezing" and tremors, dysphoria   Gabapentin Other (See Comments)   Itching, felt crawling sensation inside   Hydrocodone Itching   Latex Itching   Denies airway, lung involvement   Tramadol Itching   Levofloxacin Other (See Comments)   Shortness of breath with headache   Lithium Rash      Medication List    STOP taking these medications   baclofen 10 MG tablet Commonly known as:  LIORESAL   butalbital-acetaminophen-caffeine 50-325-40 MG tablet Commonly known as:  FIORICET, ESGIC   hydrOXYzine 25 MG tablet Commonly known as:  ATARAX/VISTARIL   insulin lispro 100 UNIT/ML injection Commonly known as:  HUMALOG   nabumetone 500 MG tablet Commonly known as:  RELAFEN   pentosan polysulfate 100 MG capsule Commonly known as:  ELMIRON   phenazopyridine 200 MG tablet Commonly known as:  PYRIDIUM   pregabalin 150 MG capsule Commonly known as:  LYRICA   QUEtiapine 50 MG tablet Commonly known as:  SEROQUEL   tiZANidine 4 MG tablet Commonly known as:  ZANAFLEX     TAKE these medications   acetaminophen 325 MG tablet Commonly known as:  TYLENOL Take 2 tablets (650 mg total) by mouth every 4 (four) hours as needed for mild pain (or Fever >/= 101).   albuterol 108 (90 Base) MCG/ACT inhaler Commonly known as:  PROVENTIL HFA;VENTOLIN HFA Inhale 2 puffs into the lungs every 6 (six) hours as needed for wheezing or shortness of  breath.   albuterol (2.5 MG/3ML) 0.083% nebulizer solution Commonly known as:  PROVENTIL Take 3 mLs (2.5 mg total) by nebulization every 6 (six) hours as needed for wheezing or shortness of breath.   ALREX 0.2 % Susp Generic drug:  loteprednol Apply 1-2 drops to eye 2 (two) times daily as needed (itching).   amLODipine 10 MG tablet Commonly known as:  NORVASC Take 1 tablet (10 mg total) by mouth daily.   amoxicillin-clavulanate 875-125 MG tablet Commonly known as:  AUGMENTIN Take 1 tablet by mouth 2 (two) times daily for 9 days.   ARTIFICIAL TEAR OP Place 1 drop into both eyes 2 (two) times daily as needed (dry eyes).   aspirin EC 81 MG tablet Take 81 mg by mouth daily.   aspirin-acetaminophen-caffeine 250-250-65 MG tablet  Commonly known as:  EXCEDRIN MIGRAINE Take 1-4 tablets by mouth every 6 (six) hours as needed for headache.   benzonatate 200 MG capsule Commonly known as:  TESSALON Take 1 capsule (200 mg total) by mouth 3 (three) times daily as needed for cough.   carvedilol 25 MG tablet Commonly known as:  COREG Take 1 tablet (25 mg total) by mouth 2 (two) times daily with a meal.   cetirizine 10 MG tablet Commonly known as:  ZYRTEC Take 1 tablet (10 mg total) by mouth 2 (two) times daily.   clobetasol ointment 0.05 % Commonly known as:  TEMOVATE Apply 1 application topically 2 (two) times daily. Apply under nails   docusate sodium 100 MG capsule Commonly known as:  COLACE Take 100 mg by mouth 2 (two) times daily as needed for moderate constipation.   erythromycin 250 MG tablet Commonly known as:  E-MYCIN take 1 tablet by mouth three times a day (TAKE 2 TO 3 TIMES A DAY)   fluorometholone 0.1 % ophthalmic suspension Commonly known as:  FML Place 1 drop into both eyes 4 (four) times daily.   fluticasone 50 MCG/ACT nasal spray Commonly known as:  FLONASE Place 2 sprays into both nostrils daily.   hydrochlorothiazide 25 MG tablet Commonly known as:   HYDRODIURIL Take 1 tablet (25 mg total) by mouth daily.   ICY HOT EX Apply 1 application topically 2 (two) times daily as needed (pain).   Insulin Glargine 100 UNIT/ML Solostar Pen Commonly known as:  LANTUS INJECT 20 UNITS SUBCUTANEOUSLY TWICE DAILY What changed:  additional instructions   Insulin Pen Needle 31G X 8 MM Misc USE 5 TIMES A DAY, WITH LANTUS AND NOVOLOG   losartan 100 MG tablet Commonly known as:  COZAAR Take 1 tablet (100 mg total) by mouth daily.   metoCLOPramide 10 MG tablet Commonly known as:  REGLAN TAKE 1 TABLET BY MOUTH THREE TIMES A DAY BEFORE MEALS What changed:  See the new instructions.   nicotine 14 mg/24hr patch Commonly known as:  NICODERM CQ - dosed in mg/24 hours Place 1 patch (14 mg total) onto the skin daily. May substitute for formulary preferred   NOVOLOG FLEXPEN 100 UNIT/ML FlexPen Generic drug:  insulin aspart Inject 15 Units into the skin See admin instructions. Inject 15 units subcutaneously up to twice daily with meals   nystatin 100000 UNIT/ML suspension Commonly known as:  MYCOSTATIN take 5 milliliters by mouth four times a day before meals and at bedtime   nystatin cream Commonly known as:  MYCOSTATIN Apply 1 application topically 2 (two) times daily.   nystatin powder Commonly known as:  MYCOSTATIN/NYSTOP Apply topically 3 (three) times daily. To affected area   Olopatadine HCl 0.2 % Soln Place 1 drop into both eyes 2 (two) times daily.   ondansetron 4 MG tablet Commonly known as:  ZOFRAN Take 1 tablet (4 mg total) by mouth every 8 (eight) hours as needed for nausea or vomiting.   pantoprazole 20 MG tablet Commonly known as:  PROTONIX Take 1 tablet (20 mg total) by mouth daily.   polyethylene glycol packet Commonly known as:  MIRALAX / GLYCOLAX Take 17 g by mouth daily as needed for mild constipation. Mix in 4-6 oz of water   rosuvastatin 20 MG tablet Commonly known as:  CRESTOR TAKE 1 TABLET BY MOUTH  DAILY AT  6PM What changed:    how much to take  how to take this  when to take this  additional  instructions   sodium chloride 0.65 % Soln nasal spray Commonly known as:  OCEAN Place 1 spray into both nostrils as needed for congestion.   spironolactone 25 MG tablet Commonly known as:  ALDACTONE TAKE 1 TABLET(25 MG) BY MOUTH DAILY   SYSTANE BALANCE 0.6 % Soln Generic drug:  Propylene Glycol Apply 1 drop to eye 3 (three) times daily as needed.   traMADol 50 MG tablet Commonly known as:  ULTRAM Take 1-2 tablets (50-100 mg total) by mouth 3 (three) times daily as needed. What changed:  reasons to take this   valACYclovir 1000 MG tablet Commonly known as:  VALTREX Take 1,000 mg by mouth 2 (two) times daily.   VICKS VAPORUB 4.7-1.2-2.6 % Oint Apply 1 application topically daily as needed. Uses with vaporizer and on chest.            Durable Medical Equipment  (From admission, onward)         Start     Ordered   03/15/18 1338  For home use only DME Walker rolling  Once    Question:  Patient needs a walker to treat with the following condition  Answer:  Perianal abscess   03/15/18 1337          Discharge Instructions: Please refer to Patient Instructions section of EMR for full details.  Patient was counseled important signs and symptoms that should prompt return to medical care, changes in medications, dietary instructions, activity restrictions, and follow up appointments.   Follow-Up Appointments: Follow-up Information    Barrett, Evelene Croon, PA-C Follow up on 04/08/2018.   Specialties:  Cardiology, Radiology Why:  Your follow up appointment will be on 04/08/18 at 10am.  Contact information: [address] [place] [address] [place] Follow up.   Why:  Vassie Moselle,  Contact information: [address] [place]  [address] [phone]        [facility] Follow up.    Specialty:  [place] Why:  Registered Nurse, Physical Thearpy, Aide,  Contact information: [address] [place] [address] [place], [place], DO 03/15/2018, 5:28 PM PGY-1, [facility]

## 2018-03-15 NOTE — Progress Notes (Signed)
Pt voided 500 cc clear yellow urine. Jessie Foot, RN

## 2018-03-15 NOTE — Progress Notes (Signed)
Physical Therapy Treatment Patient Details Name: [name] [name] MRN: [id number] DOB: [birth year] Today's Date: 03/15/2018    History of Present Illness 67 y.o. female presenting with generalized weakness and fatigue, found to have sepsis due to perirectal abscess. She underwent I&D 03/10/18. PMH is significant for T2DM with gastroparesis and peripheral neuropathy, HTN, CAD with stable angina, HFpEF, HLD, major depression with psychotic features, anxiety, and fibromyalgia.     PT Comments    Patient progressing well towards PT goals. Improved ambulation distance today with Min guard assist for balance/safety. Pt excited about going home. Recommend use of RW at home to decrease fall risk. Pt agreeable if insurance covers it. Plans to stay with sister to have some support. Pt seems to have some memory deficits- not sure if these are new or not. Able to perform dynamic standing grabbing things out of the closet and packing up without difficulty. Will follow if still in hospital.   Follow Up Recommendations  Home health PT;Supervision for mobility/OOB     Equipment Recommendations  Rolling walker with 5" wheels    Recommendations for Other Services       Precautions / Restrictions Precautions Precautions: Fall Restrictions Weight Bearing Restrictions: No    Mobility  Bed Mobility Overal bed mobility: Needs Assistance Bed Mobility: Supine to Sit     Supine to sit: Min guard;HOB elevated     General bed mobility comments: Use of rail, no physical assist needed.   Transfers Overall transfer level: Needs assistance Equipment used: Rolling walker (2 wheeled) Transfers: Sit to/from Stand Sit to Stand: Min guard         General transfer comment: Min guard for safety. Stood from [employer]. Cues for upright.   Ambulation/Gait Ambulation/Gait assistance: Min guard Gait Distance (Feet): 75 Feet Assistive device: Rolling walker (2 wheeled) Gait Pattern/deviations: Step-through  pattern;Decreased stride length;Trunk flexed Gait velocity: decreased   General Gait Details: Slow, mostly steady gait with RW for support; cues for upright posture. No DOE. No LOB.   Stairs             Wheelchair Mobility    Modified Rankin (Stroke Patients Only)       Balance Overall balance assessment: Needs assistance Sitting-balance support: Feet supported;No upper extremity supported Sitting balance-Leahy Scale: Good Sitting balance - Comments: Reaching outside BoS to grab phone on chair without LOB.   Standing balance support: During functional activity Standing balance-Leahy Scale: Fair Standing balance comment: Able to stand statically without UE support and reach into closet to get bag without LOB.                            Cognition Arousal/Alertness: Awake/alert Behavior During Therapy: WFL for tasks assessed/performed Overall Cognitive Status: No family/caregiver present to determine baseline cognitive functioning                                 General Comments: Focused on going home, "I feel like a million bucks." Pt with poor memory.       Exercises      General Comments General comments (skin integrity, edema, etc.): Pt sweating thruoghout session- reports this as baseline.      Pertinent Vitals/Pain Pain Assessment: Faces Faces Pain Scale: Hurts little more Pain Location: Rt buttocks Pain Descriptors / Indicators: Sore;Operative site guarding Pain Intervention(s): Monitored during session;Repositioned    Home Living  Prior Function            PT Goals (current goals can now be found in the care plan section) Progress towards PT goals: Progressing toward goals    Frequency    Min 3X/week      PT Plan Current plan remains appropriate    Co-evaluation              AM-PAC PT "6 Clicks" Mobility   Outcome Measure  Help needed turning from your back to your side  while in a flat bed without using bedrails?: None Help needed moving from lying on your back to sitting on the side of a flat bed without using bedrails?: A Little Help needed moving to and from a bed to a chair (including a wheelchair)?: A Little Help needed standing up from a chair using your arms (e.g., wheelchair or bedside chair)?: A Little Help needed to walk in hospital room?: A Little Help needed climbing 3-5 steps with a railing? : A Little 6 Click Score: 19    End of Session Equipment Utilized During Treatment: Gait belt Activity Tolerance: Patient tolerated treatment well Patient left: in bed;with call bell/phone within reach;with bed alarm set Nurse Communication: Mobility status PT Visit Diagnosis: Other abnormalities of gait and mobility (R26.89);Pain Pain - Right/Left: Right Pain - part of body: (buttocks)     Time: 0370-9643 PT Time Calculation (min) (ACUTE ONLY): 18 min  Charges:  $Gait Training: 8-22 mins                     Wray Kearns, PT, DPT Acute Rehabilitation Services Pager [phone] Office [place] 03/15/2018, 2:07 PM

## 2018-03-16 LAB — CULTURE, BLOOD (ROUTINE X 2)
Culture: NO GROWTH
Culture: NO GROWTH
Special Requests: ADEQUATE

## 2018-03-16 NOTE — Telephone Encounter (Signed)
Please advise 

## 2018-03-16 NOTE — Telephone Encounter (Signed)
I actually have not seen her for a long period of time.  She was actually just released from the hospital with a perirectal abscess that was treated with surgery by the general surgeons.  She should call that practice which is [facility] for pain medications and not our group.

## 2018-03-19 ENCOUNTER — Encounter (HOSPITAL_COMMUNITY): Payer: Self-pay

## 2018-03-19 ENCOUNTER — Other Ambulatory Visit: Payer: Self-pay

## 2018-03-19 ENCOUNTER — Emergency Department (HOSPITAL_COMMUNITY)
Admission: EM | Admit: 2018-03-19 | Discharge: 2018-03-19 | Disposition: A | Discharge status: Home / Self Care | Payer: Medicare Other | Attending: Emergency Medicine | Admitting: Emergency Medicine

## 2018-03-19 ENCOUNTER — Other Ambulatory Visit (INDEPENDENT_AMBULATORY_CARE_PROVIDER_SITE_OTHER): Payer: Self-pay | Admitting: Orthopaedic Surgery

## 2018-03-19 DIAGNOSIS — E1140 Type 2 diabetes mellitus with diabetic neuropathy, unspecified: Secondary | ICD-10-CM | POA: Insufficient documentation

## 2018-03-19 DIAGNOSIS — F17210 Nicotine dependence, cigarettes, uncomplicated: Secondary | ICD-10-CM | POA: Diagnosis not present

## 2018-03-19 DIAGNOSIS — I110 Hypertensive heart disease with heart failure: Secondary | ICD-10-CM | POA: Insufficient documentation

## 2018-03-19 DIAGNOSIS — G8918 Other acute postprocedural pain: Secondary | ICD-10-CM | POA: Diagnosis not present

## 2018-03-19 DIAGNOSIS — Z760 Encounter for issue of repeat prescription: Secondary | ICD-10-CM | POA: Diagnosis not present

## 2018-03-19 DIAGNOSIS — Z79899 Other long term (current) drug therapy: Secondary | ICD-10-CM | POA: Insufficient documentation

## 2018-03-19 DIAGNOSIS — I10 Essential (primary) hypertension: Secondary | ICD-10-CM

## 2018-03-19 DIAGNOSIS — I509 Heart failure, unspecified: Secondary | ICD-10-CM | POA: Insufficient documentation

## 2018-03-19 DIAGNOSIS — Z794 Long term (current) use of insulin: Secondary | ICD-10-CM | POA: Insufficient documentation

## 2018-03-19 DIAGNOSIS — Z7982 Long term (current) use of aspirin: Secondary | ICD-10-CM | POA: Insufficient documentation

## 2018-03-19 DIAGNOSIS — Z91040 Latex allergy status: Secondary | ICD-10-CM | POA: Diagnosis not present

## 2018-03-19 DIAGNOSIS — Z8673 Personal history of transient ischemic attack (TIA), and cerebral infarction without residual deficits: Secondary | ICD-10-CM | POA: Insufficient documentation

## 2018-03-19 DIAGNOSIS — J45909 Unspecified asthma, uncomplicated: Secondary | ICD-10-CM | POA: Diagnosis not present

## 2018-03-19 LAB — I-STAT CHEM 8, ED
BUN: 5 mg/dL — ABNORMAL LOW (ref 8–23)
Calcium, Ion: 1.09 mmol/L — ABNORMAL LOW (ref 1.15–1.40)
Chloride: 103 mmol/L (ref 98–111)
Creatinine, Ser: 0.6 mg/dL (ref 0.44–1.00)
Glucose, Bld: 95 mg/dL (ref 70–99)
HCT: 34 % — ABNORMAL LOW (ref 36.0–46.0)
Hemoglobin: 11.6 g/dL — ABNORMAL LOW (ref 12.0–15.0)
Potassium: 3.3 mmol/L — ABNORMAL LOW (ref 3.5–5.1)
Sodium: 141 mmol/L (ref 135–145)
TCO2: 29 mmol/L (ref 22–32)

## 2018-03-19 MED ORDER — LOSARTAN POTASSIUM 50 MG PO TABS
100.0000 mg | ORAL_TABLET | Freq: Once | ORAL | Status: AC
  Administered 2018-03-19: 100 mg via ORAL
  Filled 2018-03-19: qty 2

## 2018-03-19 MED ORDER — CARVEDILOL 12.5 MG PO TABS
25.0000 mg | ORAL_TABLET | Freq: Once | ORAL | Status: AC
  Administered 2018-03-19: 25 mg via ORAL

## 2018-03-19 MED ORDER — SITZ BATH MISC: 1.0000 [IU] | Freq: Three times a day (TID) | 0 refills | Status: DC

## 2018-03-19 MED ORDER — OXYCODONE-ACETAMINOPHEN 5-325 MG PO TABS: 1.0000 | ORAL_TABLET | ORAL | 0 refills | Status: DC | PRN

## 2018-03-19 MED ORDER — AMLODIPINE BESYLATE 5 MG PO TABS
10.0000 mg | ORAL_TABLET | Freq: Once | ORAL | Status: AC
  Administered 2018-03-19: 10 mg via ORAL

## 2018-03-19 MED ORDER — CARVEDILOL 25 MG PO TABS: 25.0000 mg | ORAL_TABLET | Freq: Two times a day (BID) | ORAL | 0 refills | Status: DC

## 2018-03-19 MED ORDER — OXYCODONE-ACETAMINOPHEN 5-325 MG PO TABS
1.0000 | ORAL_TABLET | Freq: Once | ORAL | Status: DC
  Filled 2018-03-19: qty 1

## 2018-03-19 MED ORDER — HYDROCHLOROTHIAZIDE 25 MG PO TABS: 25.0000 mg | ORAL_TABLET | Freq: Every day | ORAL | 0 refills | Status: DC

## 2018-03-19 MED ORDER — AMLODIPINE BESYLATE 5 MG PO TABS
10.0000 mg | ORAL_TABLET | Freq: Once | ORAL | Status: DC
  Filled 2018-03-19: qty 2

## 2018-03-19 MED ORDER — CARVEDILOL 12.5 MG PO TABS
25.0000 mg | ORAL_TABLET | Freq: Once | ORAL | Status: DC
  Filled 2018-03-19: qty 2

## 2018-03-19 MED ORDER — OXYCODONE-ACETAMINOPHEN 5-325 MG PO TABS
1.0000 | ORAL_TABLET | Freq: Once | ORAL | Status: AC
  Administered 2018-03-19: 1 via ORAL

## 2018-03-19 MED ORDER — AMLODIPINE BESYLATE 10 MG PO TABS: 10.0000 mg | ORAL_TABLET | Freq: Every day | ORAL | 0 refills | Status: DC

## 2018-03-19 NOTE — Discharge Instructions (Signed)
You may take the oxycodone as prescribed for relief of your abscess pain.  Do not drive within 4 hours of taking this medication.  You have been given refills today of your blood pressure medications.  Plan to touch bases with your primary doctor within the next 30 days so you can continue to receive refills of these medications.
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
## 2018-03-19 NOTE — ED Triage Notes (Signed)
Pt arrives to ED from home with complaints of high blood pressure and uncontrolled pain on her right buttock (wound present) since being discharged from [facility]. EMS reports pt's BP was 188/80, HR 76, CBG 139. Pt is alert and oriented, very good historian. Pt states she was discharged home without prescriptions for pain or blood pressure (multiple meds including losartan and coreg). Pt placed in position of comfort with bed locked and lowered, call bell in reach.

## 2018-03-22 NOTE — Telephone Encounter (Signed)
Please advise 

## 2018-03-25 ENCOUNTER — Inpatient Hospital Stay: Payer: Medicare Other | Admitting: Family Medicine

## 2018-03-26 ENCOUNTER — Telehealth: Payer: Self-pay | Admitting: *Deleted

## 2018-03-26 NOTE — Telephone Encounter (Signed)
[facility] went out last Friday to do the start of care visit.  They advised her to go to ED at that time, which she did.  They have tried to reach her everyday this week but have not received a return call.  At this time they are discharging her. Shaunee Mulkern, Salome Spotted, CMA

## 2018-03-31 ENCOUNTER — Ambulatory Visit (INDEPENDENT_AMBULATORY_CARE_PROVIDER_SITE_OTHER): Payer: Medicare Other | Admitting: Family Medicine

## 2018-03-31 ENCOUNTER — Encounter: Payer: Self-pay | Admitting: Family Medicine

## 2018-03-31 VITALS — BP 116/68 | HR 80 | Temp 99.1°F | Wt 198.6 lb

## 2018-03-31 DIAGNOSIS — I10 Essential (primary) hypertension: Secondary | ICD-10-CM

## 2018-03-31 DIAGNOSIS — K219 Gastro-esophageal reflux disease without esophagitis: Secondary | ICD-10-CM

## 2018-03-31 DIAGNOSIS — Z23 Encounter for immunization: Secondary | ICD-10-CM | POA: Diagnosis not present

## 2018-03-31 DIAGNOSIS — K611 Rectal abscess: Secondary | ICD-10-CM

## 2018-03-31 DIAGNOSIS — E1142 Type 2 diabetes mellitus with diabetic polyneuropathy: Secondary | ICD-10-CM | POA: Diagnosis not present

## 2018-03-31 DIAGNOSIS — E1169 Type 2 diabetes mellitus with other specified complication: Secondary | ICD-10-CM | POA: Diagnosis not present

## 2018-03-31 DIAGNOSIS — E785 Hyperlipidemia, unspecified: Secondary | ICD-10-CM

## 2018-03-31 LAB — POCT GLYCOSYLATED HEMOGLOBIN (HGB A1C): HbA1c, POC (controlled diabetic range): 7.6 % — AB (ref 0.0–7.0)

## 2018-03-31 MED ORDER — PANTOPRAZOLE SODIUM 20 MG PO TBEC: 20.0000 mg | DELAYED_RELEASE_TABLET | Freq: Every day | ORAL | 2 refills | Status: DC

## 2018-03-31 MED ORDER — TRAMADOL HCL 50 MG PO TABS: 50.0000 mg | ORAL_TABLET | Freq: Two times a day (BID) | ORAL | 1 refills | Status: DC | PRN

## 2018-03-31 MED ORDER — ROSUVASTATIN CALCIUM 20 MG PO TABS: ORAL_TABLET | ORAL | 3 refills | Status: DC

## 2018-03-31 NOTE — Assessment & Plan Note (Signed)
Reasonable A1c today

## 2018-03-31 NOTE — Patient Instructions (Addendum)
Good to see you today!  Thanks for coming in.  Diabetes - your a1c is good - continue the insulin as you are taking.  If you have a sweat check your blood sugar.  If less than 60 call us   Abscess - looks like is healing well.  No need for more antibiotics.  See Dr [name] on Friday  BP - Keep taking the HCTZ, and the Coreg and the Amlodipine  - Hold the Spironolactone and the Losartan until you come back or see your cardiologist   Heart Burn - take the protonix as needed  Pain - Use tylenol regularly - I will refill your tramadol  See Dr Tammi Klippel in 3 months

## 2018-03-31 NOTE — Progress Notes (Signed)
Subjective  [name] [name] is a 67 y.o. female is presenting with the following.  Her niece provides information and is helping with her care   ABSCESS Had drainage on 11/19 in the hospital.  Has finished all her antibiotics.  Was seen in ER on 11/29 where her blood pressure medications were refilled.  The incision site had minimal discharge then.    Patient feels tired and sweaty but no specific fevers or vomiting or rash.  Still having some discharge.  Has appointment to see surgeon for follow up in 2 days.   HYPERTENSION Disease Monitoring  Home BP Monitoring (Severity) not checking Symptoms - Chest pain- no    Dyspnea- no Medications (Modifying factors) Compliance-  Taking HCTZ, amlodipine and Coreg NOT taking losartan or spironolactone. Lightheadedness-  mild  Edema- no Timing - continuous  Duration - years ROS - See HPI  GERD Disease Monitoring Typical Symptoms:  Feels a burning in her lower stomach that goes to the top and often nauseaous    Timing - intermittent.   Duration - years  Bleeding (hematemesis/melena):  no  Food sticking (severity):  no  Weight Loss:  no  Medication Monitoring (modifying factors) Compliance:  Out of protonix    R  PMH Lab Review   Potassium  Date Value Ref Range Status  03/19/2018 3.3 (L) 3.5 - 5.1 mmol/L Final   Sodium  Date Value Ref Range Status  03/19/2018 141 135 - 145 mmol/L Final  12/02/2017 142 134 - 144 mmol/L Final   Creat  Date Value Ref Range Status  03/19/2016 0.75 0.50 - 0.99 mg/dL Final    Comment:      For patients > or = 69 years of age: The upper reference limit for Creatinine is approximately 13% higher for people identified as African-American.      Creatinine, Ser  Date Value Ref Range Status  03/19/2018 0.60 0.44 - 1.00 mg/dL Final        From 11/25 DC Summary Issues for Follow Up:  1. Patient instructed to return to the Emergency Department if you experience worsening symptoms such as fevers or unrelenting  pain at the surgical site. 2. The patient will take Augmentin through December 4th.  3. The surgeon's office will contact the patient to schedule a follow up appointment. 3. Continue using Sitz/Epsom salt baths to keep the wound clean and encourage proper healing. 4. The patient was instructed to shower after each bowel movement.    Chief Complaint noted Review of Symptoms - see HPI PMH - Smoking status noted.    Objective Vital Signs reviewed BP 116/68   Pulse 80   Temp 99.1 F (37.3 C) (Oral)   Wt 198 lb 9.6 oz (90.1 kg)   SpO2 99%   BMI 35.18 kg/m  Alert no acute distress Heart - Regular rate and rhythm.  No murmurs, gallops or rubs.    Lungs:  Normal respiratory effort, chest expands symmetrically. Lungs are clear to auscultation, no crackles or wheezes. Extremities:  No cyanosis, edema, or deformity noted with good range of motion of all major joints.   Abdomen: soft and non-tender without masses, organomegaly or hernias noted.  No guarding or rebound Abscess - 9 oclock - minimal drainage, no fluctuance, moderately tender   Assessments/Plans  See after visit summary for details of patient instuctions  DM type 2 with diabetic peripheral neuropathy ([place]) Reasonable A1c today  Essential hypertension At goal today.  Continue to hold losartan and spiro until rechecked  Perirectal abscess Stable.   No need for antibiotics - Follow up with her surgeon in 2 days.  I did refill her tramadol   GERD (gastroesophageal reflux disease) Not controlled - restart Protonix

## 2018-03-31 NOTE — Assessment & Plan Note (Signed)
At goal today.  Continue to hold losartan and spiro until rechecked

## 2018-03-31 NOTE — Assessment & Plan Note (Addendum)
Stable.   No need for antibiotics - Follow up with her surgeon in 2 days.  I did refill her tramadol

## 2018-03-31 NOTE — Assessment & Plan Note (Signed)
Not controlled - restart Protonix

## 2018-04-08 ENCOUNTER — Ambulatory Visit: Payer: Medicare Other | Admitting: Physician Assistant

## 2018-04-22 ENCOUNTER — Encounter (INDEPENDENT_AMBULATORY_CARE_PROVIDER_SITE_OTHER): Payer: Self-pay

## 2018-04-22 ENCOUNTER — Encounter: Payer: Self-pay | Admitting: Physician Assistant

## 2018-04-22 ENCOUNTER — Ambulatory Visit: Payer: Medicare Other | Admitting: Physician Assistant

## 2018-04-22 VITALS — BP 152/76 | HR 82 | Ht 63.0 in | Wt 195.8 lb

## 2018-04-22 DIAGNOSIS — I10 Essential (primary) hypertension: Secondary | ICD-10-CM

## 2018-04-22 DIAGNOSIS — R0789 Other chest pain: Secondary | ICD-10-CM | POA: Diagnosis not present

## 2018-04-22 DIAGNOSIS — E785 Hyperlipidemia, unspecified: Secondary | ICD-10-CM | POA: Diagnosis not present

## 2018-04-22 MED ORDER — CARVEDILOL 25 MG PO TABS: 25.0000 mg | ORAL_TABLET | Freq: Two times a day (BID) | ORAL | 2 refills | Status: DC

## 2018-04-22 MED ORDER — AMLODIPINE BESYLATE 10 MG PO TABS: 10.0000 mg | ORAL_TABLET | Freq: Every day | ORAL | 3 refills | Status: DC

## 2018-04-22 MED ORDER — CARVEDILOL 25 MG PO TABS: 25.0000 mg | ORAL_TABLET | Freq: Two times a day (BID) | ORAL | 0 refills | Status: DC

## 2018-04-22 MED ORDER — AMLODIPINE BESYLATE 10 MG PO TABS: 10.0000 mg | ORAL_TABLET | Freq: Every day | ORAL | 0 refills | Status: DC
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
## 2018-04-22 NOTE — Patient Instructions (Signed)
Medication Instructions:  Restart Carvedilol 25 mg twice daily. Restart Amlodipine 10 mg daily.  If you need a refill on your cardiac medications before your next appointment, please call your pharmacy.   Lab work: CMET, Lipid today. If you have labs (blood work) drawn today and your tests are completely normal, you will receive your results only by: Marland Kitchen MyChart Message (if you have MyChart) OR . A paper copy in the mail If you have any lab test that is abnormal or we need to change your treatment, we will call you to review the results.  Follow-Up: At [facility], you and your health needs are our priority.  As part of our continuing mission to provide you with exceptional heart care, we have created designated Provider Care Teams.  These Care Teams include your primary Cardiologist (physician) and Advanced Practice Providers (APPs -  Physician Assistants and Nurse Practitioners) who all work together to provide you with the care you need, when you need it. You will need a follow up appointment in 6 months.  Please call our office 2 months in advance to schedule this appointment.  You may see Pixie Casino, MD or one of the following Advanced Practice Providers on your designated Care Team: [place], [place] . Fabian Sharp, PA-C  Any Other Special Instructions Will Be Listed Below (If Applicable). Start a BP log. Take BP at different times of the day.  Get a BP cuff.  Bring to next visit.

## 2018-04-23 LAB — LIPID PANEL
Chol/HDL Ratio: 3.1 ratio (ref 0.0–4.4)
Cholesterol, Total: 141 mg/dL (ref 100–199)
HDL: 45 mg/dL (ref >39)
LDL Calculated: 65 mg/dL (ref 0–99)
Triglycerides: 156 mg/dL — ABNORMAL HIGH (ref 0–149)
VLDL Cholesterol Cal: 31 mg/dL (ref 5–40)

## 2018-04-23 LAB — COMPREHENSIVE METABOLIC PANEL
ALT: 14 IU/L (ref 0–32)
AST: 16 IU/L (ref 0–40)
Albumin/Globulin Ratio: 1.3 (ref 1.2–2.2)
Albumin: 4.4 g/dL (ref 3.6–4.8)
Alkaline Phosphatase: 66 IU/L (ref 39–117)
BUN/Creatinine Ratio: 23 (ref 12–28)
BUN: 25 mg/dL (ref 8–27)
Bilirubin Total: 0.3 mg/dL (ref 0.0–1.2)
CO2: 20 mmol/L (ref 20–29)
Calcium: 9.6 mg/dL (ref 8.7–10.3)
Chloride: 102 mmol/L (ref 96–106)
Creatinine, Ser: 1.07 mg/dL — ABNORMAL HIGH (ref 0.57–1.00)
GFR calc Af Amer: 61 mL/min/{1.73_m2} (ref >59)
GFR calc non Af Amer: 53 mL/min/{1.73_m2} — ABNORMAL LOW (ref >59)
Globulin, Total: 3.5 g/dL (ref 1.5–4.5)
Glucose: 120 mg/dL — ABNORMAL HIGH (ref 65–99)
Potassium: 4.5 mmol/L (ref 3.5–5.2)
Sodium: 139 mmol/L (ref 134–144)
Total Protein: 7.9 g/dL (ref 6.0–8.5)

## 2018-05-28 ENCOUNTER — Other Ambulatory Visit (HOSPITAL_COMMUNITY): Payer: Self-pay | Admitting: Family Medicine

## 2018-05-28 DIAGNOSIS — E1142 Type 2 diabetes mellitus with diabetic polyneuropathy: Secondary | ICD-10-CM

## 2018-07-03 ENCOUNTER — Other Ambulatory Visit: Payer: Self-pay | Admitting: Family Medicine

## 2018-07-18 ENCOUNTER — Other Ambulatory Visit: Payer: Self-pay | Admitting: Physician Assistant

## 2018-08-20 ENCOUNTER — Other Ambulatory Visit: Payer: Self-pay

## 2018-08-21 MED ORDER — INSULIN PEN NEEDLE 31G X 8 MM MISC: 99 refills | Status: DC

## 2018-09-10 ENCOUNTER — Other Ambulatory Visit: Payer: Self-pay

## 2018-09-10 ENCOUNTER — Ambulatory Visit (INDEPENDENT_AMBULATORY_CARE_PROVIDER_SITE_OTHER): Payer: Medicare Other | Admitting: Family Medicine

## 2018-09-10 VITALS — BP 152/72 | HR 98 | Temp 98.7°F | Ht 63.0 in | Wt 208.0 lb

## 2018-09-10 DIAGNOSIS — E1142 Type 2 diabetes mellitus with diabetic polyneuropathy: Secondary | ICD-10-CM | POA: Diagnosis not present

## 2018-09-10 DIAGNOSIS — R109 Unspecified abdominal pain: Secondary | ICD-10-CM | POA: Diagnosis not present

## 2018-09-10 DIAGNOSIS — K760 Fatty (change of) liver, not elsewhere classified: Secondary | ICD-10-CM | POA: Diagnosis not present

## 2018-09-10 DIAGNOSIS — B372 Candidiasis of skin and nail: Secondary | ICD-10-CM | POA: Diagnosis not present

## 2018-09-10 DIAGNOSIS — R519 Headache, unspecified: Secondary | ICD-10-CM

## 2018-09-10 DIAGNOSIS — R51 Headache: Secondary | ICD-10-CM

## 2018-09-10 LAB — POCT URINALYSIS DIP (MANUAL ENTRY)
Bilirubin, UA: NEGATIVE
Blood, UA: NEGATIVE
Glucose, UA: NEGATIVE mg/dL
Ketones, POC UA: NEGATIVE mg/dL
Leukocytes, UA: NEGATIVE
Nitrite, UA: NEGATIVE
Protein Ur, POC: NEGATIVE mg/dL
Spec Grav, UA: 1.015 (ref 1.010–1.025)
Urobilinogen, UA: 0.2 E.U./dL
pH, UA: 6 (ref 5.0–8.0)

## 2018-09-10 LAB — POCT GLYCOSYLATED HEMOGLOBIN (HGB A1C): HbA1c, POC (controlled diabetic range): 8.4 % — AB (ref 0.0–7.0)

## 2018-09-10 MED ORDER — GABAPENTIN 100 MG PO CAPS: 100.0000 mg | ORAL_CAPSULE | Freq: Three times a day (TID) | ORAL | 0 refills | Status: DC | PRN

## 2018-09-10 MED ORDER — NYSTATIN 100000 UNIT/GM EX POWD: Freq: Three times a day (TID) | CUTANEOUS | 3 refills | Status: DC

## 2018-09-10 MED ORDER — BUTALBITAL-ASPIRIN-CAFFEINE 50-325-40 MG PO CAPS: 1.0000 | ORAL_CAPSULE | Freq: Two times a day (BID) | ORAL | 0 refills | Status: DC | PRN

## 2018-09-10 NOTE — Assessment & Plan Note (Signed)
  No alarm symptoms. Refilled fioricet #20. Follow up with PCP 2 weeks.

## 2018-09-10 NOTE — Progress Notes (Signed)
   Subjective:    Patient ID: [name] [name], female    DOB: March 06, 1951, 68 y.o.   MRN: [id number]   CC: yeast rash, headaches, nerve pain  Nerve pain- has burning pain in hands, feet. Reports her gabapentin was stopped because she thought it was causing an allergic reaction in the way of "skin crawling" and itching all over. She recently took a family member's gabapentin for nerve pain with improvement and no allergic reactions. Also reports burning pain on the sides of her stomach. Reports this has happened since hernia repair 7 years ago. She denies epigastric pain, vomiting, diarrhea, unintentional weight loss, dysuria, flank pain. She has history of reflux/ulcers and takes PPI.   Headaches- feels like "needles are stabbing in my brain". Reports strong history of migraines. Has had headaches daily since last week and cannot get rid of them. She has tried excedrin migraine with no relief. She is requesting refill of her old migraine medication, fioricet.   Yeast rash- under pannus. Reports this comes and goes and becomes inflamed and painful. She ran out of nystatin powder and called for refill but was told she needed to be seen.  Fatty liver- reports she received a letter in the mail months ago about her liver having fatty changes. She is concerned about this and does not know what to do about it. Denies alcohol use.   Smoking status reviewed- current smoker  Review of Systems- see HPI   Objective:  BP (!) 152/72   Pulse 98   Temp 98.7 F (37.1 C) (Oral)   Ht 5\' 3"  (1.6 m)   Wt 208 lb (94.3 kg)   SpO2 97%   BMI 36.85 kg/m  Vitals and nursing note reviewed  General: well nourished, in no acute distress HEENT: normocephalic, mask in place Cardiac: regular rate Respiratory: no increased work of breathing Abdomen: obese abdomen with irregular shaped pannus Skin: warm and dry. Erythematous raised rash underneath pannus Neuro: alert and oriented, no focal deficits   Assessment  & Plan:    DM type 2 with diabetic peripheral neuropathy (HCC)  Restarting gabapentin. A1C today 8.4, patient made appt to see PCP in 2 weeks to discuss regimen, currently taking lantus 40 BID. Continue current regimen.  NAFLD (nonalcoholic fatty liver disease)  Noted on CT imaging 03/08/18, discussed with patient recommendation to lose 10% body weight (20 pounds), avoid alcohol. Per up to date patient should be monitored for fibrosis of liver with intermittent imaging. Her recent CMP From January indicated no LFT elevation.   Candidal intertrigo  Weight loss as above. rx for nystatin powder refilled. Discussed keeping area clean and dry.  Headache  No alarm symptoms. Refilled fioricet #20. Follow up with PCP 2 weeks.   Abdominal pain  UA obtained by CMA, which was negative. Upon talking with patient this pain is located on the sides where ports were placed for hernia repair and sounds like nerve pain. Restarting gabapentin as above. Follow up with PCP in 2 weeks to recheck.     Return in about 2 weeks (around 09/24/2018).   Lucila Maine, DO Family Medicine Resident PGY-3

## 2018-09-10 NOTE — Assessment & Plan Note (Signed)
  Restarting gabapentin. A1C today 8.4, patient made appt to see PCP in 2 weeks to discuss regimen, currently taking lantus 40 BID. Continue current regimen.

## 2018-09-10 NOTE — Assessment & Plan Note (Signed)
  Weight loss as above. rx for nystatin powder refilled. Discussed keeping area clean and dry.

## 2018-09-10 NOTE — Assessment & Plan Note (Signed)
  UA obtained by CMA, which was negative. Upon talking with patient this pain is located on the sides where ports were placed for hernia repair and sounds like nerve pain. Restarting gabapentin as above. Follow up with PCP in 2 weeks to recheck.

## 2018-09-10 NOTE — Patient Instructions (Addendum)
  Good to see you today Please restart gabapentin and see how you do with the nerve pains in hands, feet.  I refilled the headache medication to use as needed  I also refilled the yeast powder.   It is important for your health to lose weight. I recommend losing 10% of your weight, or 20 pounds. This will help with your diabetes, blood pressure, fatty liver disease, and the recurrent skin yeast infections.  I have made you an appointment on 6/2, Tuesday, at 1:30 pm with your primary doctor Dr. Tammi Klippel to follow up on your chronic medical problems and check in. It is important you keep this appointment.  If you have questions or concerns please do not hesitate to call at [phone].  Lucila Maine, DO PGY-3, [facility] 09/10/2018 10:44 AM

## 2018-09-10 NOTE — Assessment & Plan Note (Signed)
  Noted on CT imaging 03/08/18, discussed with patient recommendation to lose 10% body weight (20 pounds), avoid alcohol. Per up to date patient should be monitored for fibrosis of liver with intermittent imaging. Her recent CMP From January indicated no LFT elevation.

## 2018-09-10 NOTE — Progress Notes (Signed)
ur

## 2018-09-17 ENCOUNTER — Other Ambulatory Visit: Payer: Self-pay | Admitting: Family Medicine

## 2018-09-20 NOTE — Progress Notes (Signed)
Subjective:    Patient ID: [name] [name], female    DOB: [birth year], 68 y.o.   MRN: [id number]   CC: Diabetes, HTN, HLD  HPI: Diabetes Fasting checks: 240s, but only gets as low as 190 Post prandial does not check  Compliance: good compliance  Diet: eats more meat and canned vegetables, sometimes fresh cabbage, zucchini, corn. Sometimes eats hamburger buns (1-2 times a week)  Exercise: walks daily  Eye exam: saw doctor 3 weeks ago  Foot exam: will obtain today  A1C: 8.4 (09/10/2018) Symptoms: no symptoms of hypoglycemia. some symptoms of  polyuria, polydipsia. yes numbness in extremities, and no foot ulcers/trauma Meds: lantus 40U bid, gabapentin  Hypertension: - Medications: HCTZ 25mg  , amlodipine 10mg  and Coreg 25mg  bid; NOT taking losartan or spironolactone.  - Compliance: good - Checking BP at home: checks every other day,  - Denies any SOB, CP, Does report headache and vision changes with headaches, no LE edema, medication SEs, or symptoms of hypotension - Diet: see above  - Exercise: see above   Hyperlipidemia Meds: rosuvastatin 20mg  (not extensively metabolized in liver)  Diet: see above Exercise: see above  The 10-year ASCVD risk score [name] DC Jr., et al., 2013) is: 53.7%   Values used to calculate the score:     Age: 17 years     Sex: Female     Is Non-Hispanic African American: Yes     Diabetic: Yes     Tobacco smoker: Yes     Systolic Blood Pressure: 782 mmHg     Is BP treated: Yes     HDL Cholesterol: 45 mg/dL     Total Cholesterol: 141 mg/dL  Abdominal Pain Patient presenting for abdominal pain.  States that it is been hurting and burning.  Diffuse.  States that radiates to her back occasionally.  States that is all the time.  Has tried ibuprofen, Tylenol with no help.  Has tried Mylanta in the past which helped some.  Has been occurring for months now.  Has been worse.  Does report nausea and vomiting.  Does report some diarrhea past 2 weeks that was  black or green.  This is a chronic problem.  Patient also has a history of nonalcoholic he had a hepatitis.  Has not been followed by any GI specialist.   Objective:  BP (!) 172/80    Pulse 98    SpO2 99%  Vitals and nursing note reviewed  General: well nourished, in no acute distress HEENT: normocephalic, no scleral icterus or conjunctival pallor, no nasal discharge, moist mucous membranes, good dentition without erythema or discharge noted in posterior oropharynx Neck: supple  Cardiac: RRR, clear S1 and S2, no murmurs, rubs, or gallops Respiratory: clear to auscultation bilaterally, no increased work of breathing Abdomen: soft, nontender, nondistended, no masses or organomegaly. Bowel sounds present Extremities: no edema or cyanosis. Warm, well perfused. 2+ PT pulses bilaterally Skin: warm and dry, no rashes noted Neuro: alert and oriented, no focal deficits Foot exam: No deformities, no ulcerations, no other skin breakdown bilaterally.  Intact to touch and monofilament testing bilaterally.  PT and DP intact pulses bilaterally.   Assessment & Plan:    DM type 2 with diabetic peripheral neuropathy ([place]) Poorly controlled despite compliance with Lantus as well as healthy diet and daily exercise.  It is concerning that patient is very insulin resistant given that she is on 40 units twice a day.  Discussed this with Dr. Valentina Lucks in pharmacy team.  Patient  is comfortable with injection so will start Victoza at 0.6 for 1 week and then increase to 1.2.  Discussed GI side effects and inform patient to notify us immediately if she has these.  Patient is agreeable with this plan.  Advised to continue to check her blood sugars regularly throughout the day and inform us with any low or high sugar readings.  Follow-up in 1 month.  Essential hypertension Poorly controlled.  Patient is now symptomatic as well.  Normal physical exam today.  Advised patient to use losartan.  Will start at 50 mg and will  likely need to increase to 100 mg.  Will start at 50 given that patient had hypotension and was required to be discontinued off of losartan and spironolactone in the past.  Advised that patient check her blood sugars daily at home and call with any high or low readings.  Advised 1 week follow-up so that we can ensure her blood pressures are within goal.  Advised that if patient continues to have headaches, vision changes, chest pain, edema she needs to seek emergency care immediately.  Strict return precautions given.  Follow-up in 1 week.  HLD (hyperlipidemia) Continue rosuvastatin.  This is not as extensively metabolized in the liver.  Advised weight loss for [name].  Patient has an ASCVD risk score of 53.7%.  Will need risk factor improvement.  Abdominal pain Patient reports no improvement in pain.  This is a chronic issue and patient has history of GI disorders such as [name].  Patient is went very worried because her sister had to see a GI specialist for similar symptoms and only improved after she was diagnosed with endoscopy.  Given that symptoms have been lasting for several months will send referral to GI specialist especially given her history of [name].  Follow-up as needed at [facility].    Return in about 1 week (around 09/28/2018).   [name], DO, PGY-2

## 2018-09-21 ENCOUNTER — Encounter: Payer: Self-pay | Admitting: Family Medicine

## 2018-09-21 ENCOUNTER — Other Ambulatory Visit: Payer: Self-pay | Admitting: Family Medicine

## 2018-09-21 ENCOUNTER — Other Ambulatory Visit: Payer: Self-pay

## 2018-09-21 ENCOUNTER — Ambulatory Visit (INDEPENDENT_AMBULATORY_CARE_PROVIDER_SITE_OTHER): Payer: Medicare Other | Admitting: Family Medicine

## 2018-09-21 VITALS — BP 172/80 | HR 98

## 2018-09-21 DIAGNOSIS — E785 Hyperlipidemia, unspecified: Secondary | ICD-10-CM

## 2018-09-21 DIAGNOSIS — R1084 Generalized abdominal pain: Secondary | ICD-10-CM | POA: Diagnosis not present

## 2018-09-21 DIAGNOSIS — E1142 Type 2 diabetes mellitus with diabetic polyneuropathy: Secondary | ICD-10-CM | POA: Diagnosis not present

## 2018-09-21 DIAGNOSIS — K7581 Nonalcoholic steatohepatitis (NASH): Secondary | ICD-10-CM | POA: Diagnosis not present

## 2018-09-21 DIAGNOSIS — I10 Essential (primary) hypertension: Secondary | ICD-10-CM

## 2018-09-21 DIAGNOSIS — E7849 Other hyperlipidemia: Secondary | ICD-10-CM

## 2018-09-21 HISTORY — DX: Hyperlipidemia, unspecified: E78.5

## 2018-09-21 MED ORDER — LOSARTAN POTASSIUM 50 MG PO TABS: 50.0000 mg | ORAL_TABLET | Freq: Every day | ORAL | 0 refills | Status: DC

## 2018-09-21 MED ORDER — LIRAGLUTIDE 18 MG/3ML ~~LOC~~ SOPN: 0.6000 mg | PEN_INJECTOR | Freq: Every day | SUBCUTANEOUS | 3 refills | Status: DC

## 2018-09-21 NOTE — Assessment & Plan Note (Signed)
Poorly controlled despite compliance with Lantus as well as healthy diet and daily exercise.  It is concerning that patient is very insulin resistant given that she is on 40 units twice a day.  Discussed this with Dr. Valentina Lucks in pharmacy team.  Patient is comfortable with injection so will start Victoza at 0.6 for 1 week and then increase to 1.2.  Discussed GI side effects and inform patient to notify us immediately if she has these.  Patient is agreeable with this plan.  Advised to continue to check her blood sugars regularly throughout the day and inform us with any low or high sugar readings.  Follow-up in 1 month.

## 2018-09-21 NOTE — Assessment & Plan Note (Signed)
Patient reports no improvement in pain.  This is a chronic issue and patient has history of GI disorders such as [name].  Patient is went very worried because her sister had to see a GI specialist for similar symptoms and only improved after she was diagnosed with endoscopy.  Given that symptoms have been lasting for several months will send referral to GI specialist especially given her history of [name].  Follow-up as needed at [facility].

## 2018-09-21 NOTE — Patient Instructions (Signed)
It was a pleasure seeing you today.   Today we discussed your diabetes and blood pressure and stomach pain  For your diabetes: I have started you on a medicine called Victoza.  Please take 0.6 daily for 1 week.  After 1 week increase the dose to 1.2. PLEASE STOP TAKING THIS MEDICINE IF YOU BEGIN VOMITING AND CALL THE CLINIC.  Please follow-up in 1 month with either Dr. Valentina Lucks or I. Check your sugars daily.   Your high blood pressures: I have restarted your losartan at 50 mg.  Please follow-up in 1 week at the nursing clinic so we may check your blood pressures.  If they are still high we may increase this medicine or add a fourth agent.  If your blood pressures are high at home or low please call the clinic immediately.  If you continue to have headaches or vision changes please go to the emergency department.  If you have slurred speech please go the emergency department.  For your stomach pain: I have referred you to a GI doctor.  Please follow up in 1 week or sooner if symptoms persist or worsen. Please call the clinic immediately if you have any concerns.   Our clinic's number is [phone]. Please call with questions or concerns.   Please go to the emergency room if you have chest pain, shortness of breath, vision changes.  Headaches that do not go away.  Thank you,  [name], DO

## 2018-09-21 NOTE — Assessment & Plan Note (Addendum)
Continue rosuvastatin.  This is not as extensively metabolized in the liver.  Advised weight loss for [name] [name].  Patient has an ASCVD risk score of 53.7%.  Will need risk factor improvement.

## 2018-09-21 NOTE — Assessment & Plan Note (Signed)
Poorly controlled.  Patient is now symptomatic as well.  Normal physical exam today.  Advised patient to use losartan.  Will start at 50 mg and will likely need to increase to 100 mg.  Will start at 50 given that patient had hypotension and was required to be discontinued off of losartan and spironolactone in the past.  Advised that patient check her blood sugars daily at home and call with any high or low readings.  Advised 1 week follow-up so that we can ensure her blood pressures are within goal.  Advised that if patient continues to have headaches, vision changes, chest pain, edema she needs to seek emergency care immediately.  Strict return precautions given.  Follow-up in 1 week.

## 2018-10-05 ENCOUNTER — Other Ambulatory Visit: Payer: Self-pay | Admitting: Physician Assistant

## 2018-10-09 ENCOUNTER — Other Ambulatory Visit: Payer: Self-pay | Admitting: Family Medicine

## 2019-02-18 ENCOUNTER — Other Ambulatory Visit: Payer: Self-pay | Admitting: Family Medicine

## 2019-02-21 NOTE — Telephone Encounter (Signed)
Not on patient's med list, if patient wants an RX for atarax she needs an appointment (can be with another provider if I am not available).   Dalphine Handing, PGY-3 [facility] 02/21/2019 8:25 AM

## 2019-02-22 ENCOUNTER — Telehealth: Payer: Self-pay | Admitting: Family Medicine

## 2019-02-22 NOTE — Telephone Encounter (Signed)
Pt has an appt already. Deseree Kennon Holter, CMA

## 2019-02-22 NOTE — Telephone Encounter (Signed)
[employer] form dropped off for transportation at front desk for completion.  Verified that patient section of form has been completed.  Last DOS/WCC with PCP was 09/21/2018.  Placed form in team folder to be completed by clinical staff.  [name] [name]

## 2019-02-22 NOTE — Telephone Encounter (Signed)
Reviewed form for [employer] and placed in PCP's box for completion.  [name] [name], [name] [name]

## 2019-02-23 NOTE — Telephone Encounter (Signed)
Attempted call again, no answer  If patient calls back please clarify what disability she is applying for transportation.  Unable to complete paperwork until we clarify what disability she is applying for  [name], DO, PGY-3 [facility] 02/23/2019 2:44 PM

## 2019-02-23 NOTE — Telephone Encounter (Signed)
Called patient to clarify paperwork with no answer. Left VM to please call clinic.   Paperwork asks to specify patient's disability. Will need to clarify this with patient.   Dalphine Handing, PGY-3 [facility] 02/23/2019 8:15 AM

## 2019-02-24 NOTE — Telephone Encounter (Signed)
Attempted to call patient again using number provided on form completion request paperwork [phone]). No answer. VM is full so cannot leave message.   Please call patient and ask her what disability she is applying for. If she needs to discuss this further please schedule for telemedicine visit as we have attempted to call several times with no answer so it may be better to have an appointment to determine if she qualifies for [employer] transportation.   Dalphine Handing, PGY-3 [facility] 02/24/2019 10:26 AM

## 2019-02-25 NOTE — Telephone Encounter (Signed)
Attempted to call patient x 2 with no answer either time and no voice mail ability to leave message.  [name] [name], [place]

## 2019-02-25 NOTE — Telephone Encounter (Signed)
Can we please send a letter to patient requesting her to schedule a telemedicine visit vs. In person visit to determine eligibility for [employer] transportation, we have attempted to call several times now without an answer  [name], DO, PGY-3 [facility] 02/25/2019 10:25 PM

## 2019-02-28 NOTE — Telephone Encounter (Addendum)
After numerous attempts was able to reach patient's emergency contact Orland Penman and she will give message to patient to schedule a telemedicine appointment because patient does not drive and has problems with transportation.    She has been having problems with her phone recently according to her niece Ms. Blue.  Ozella Almond, [place]

## 2019-03-02 ENCOUNTER — Ambulatory Visit (INDEPENDENT_AMBULATORY_CARE_PROVIDER_SITE_OTHER): Payer: Medicare Other | Admitting: Family Medicine

## 2019-03-02 ENCOUNTER — Other Ambulatory Visit: Payer: Self-pay

## 2019-03-02 DIAGNOSIS — E1142 Type 2 diabetes mellitus with diabetic polyneuropathy: Secondary | ICD-10-CM

## 2019-03-02 DIAGNOSIS — F8081 Childhood onset fluency disorder: Secondary | ICD-10-CM

## 2019-03-02 DIAGNOSIS — I10 Essential (primary) hypertension: Secondary | ICD-10-CM | POA: Diagnosis not present

## 2019-03-02 LAB — POCT GLYCOSYLATED HEMOGLOBIN (HGB A1C): HbA1c, POC (controlled diabetic range): 8.2 % — AB (ref 0.0–7.0)

## 2019-03-02 MED ORDER — GLUCOSE BLOOD VI STRP: ORAL_STRIP | 12 refills | Status: DC

## 2019-03-02 MED ORDER — LIRAGLUTIDE 18 MG/3ML ~~LOC~~ SOPN: 0.6000 mg | PEN_INJECTOR | Freq: Every day | SUBCUTANEOUS | 3 refills | Status: DC

## 2019-03-02 MED ORDER — LANTUS SOLOSTAR 100 UNIT/ML ~~LOC~~ SOPN: PEN_INJECTOR | SUBCUTANEOUS | 0 refills | Status: DC

## 2019-03-02 MED ORDER — ONETOUCH DELICA LANCETS 30G MISC: 1.0000 "application " | Freq: Four times a day (QID) | 1 refills | Status: DC

## 2019-03-02 MED ORDER — ONETOUCH DELICA LANCING DEV MISC: 1.0000 | Freq: Four times a day (QID) | 1 refills | Status: DC

## 2019-03-02 MED ORDER — ONETOUCH VERIO W/DEVICE KIT: 1.0000 | PACK | Freq: Four times a day (QID) | 1 refills | Status: DC

## 2019-03-02 MED ORDER — GABAPENTIN 100 MG PO CAPS: 300.0000 mg | ORAL_CAPSULE | Freq: Every day | ORAL | 0 refills | Status: DC

## 2019-03-02 NOTE — Progress Notes (Signed)
Subjective:    Patient ID: [name] [name], female    DOB: 08-31-1950, 68 y.o.   MRN: [id number]   CC: multiple complaints, diabetes, stuttering, pain, HTN   HPI: Diabetes  Fasting checks: does not check, needs a new glucometer  Post prandial does not check   Compliance: does not take victoza  Diet: does not watch   Exercise: none  Eye exam: due Foot exam: UTD A1C: 8.2  Symptoms: no symptoms of hypoglycemia. no symptoms of  polyuria, polydipsia. no numbness in extremities, and no foot ulcers/trauma Meds: victoza, lantus 40U bid  Pneumonia vaccine: completed per HM tab  Urine micro albumin:creatine ratio: previously on ARB  Monitoring Labs and Parameters Last A1C:  Lab Results  Component Value Date   HGBA1C 8.2 (A) 03/02/2019   Last Lipid:     Component Value Date/Time   CHOL 141 04/22/2018 1431   HDL 45 04/22/2018 1431   LDLDIRECT 62.5 12/01/2011 0832   Last Bmet  Potassium  Date Value Ref Range Status  03/02/2019 5.0 3.5 - 5.2 mmol/L Final   Sodium  Date Value Ref Range Status  03/02/2019 140 134 - 144 mmol/L Final   Creat  Date Value Ref Range Status  03/19/2016 0.75 0.50 - 0.99 mg/dL Final    Comment:      For patients > or = 70 years of age: The upper reference limit for Creatinine is approximately 13% higher for people identified as African-American.      Creatinine, Ser  Date Value Ref Range Status  03/02/2019 0.86 0.57 - 1.00 mg/dL Final      Hypertension: - Medications: carvedilol, amlodipine, losartan, HCTZ, spironolactone  - Compliance: is not taking losartan, HCTZ, or spironolactone.  - Checking BP at home: no - Denies any SOB, CP, vision changes, LE edema, medication SEs, or symptoms of hypotension - Diet: see above - Exercise: see above  Stuttering Patient reports stuttering starting 3 weeks ago. Reports some numbness and tingling as well. Does report burning pain as well down her left arm and left leg. Reports normal gait. Did fall  once and hit the left side of head. She think symptoms started around then   Pain Patient reports pain in arms, hands, legs, across back and knees. Bilateral symptoms. States she has tried ibuprofen, tylenol, excedrin, ASA, icy hot, and voltaren gel without improvement. Pain has been occurring x1 month. Reports it is burning/tingling in sensation. Reports sometimes her hands drop things but not all the time. Reports using a walker, not for weakness but due to pain.    Objective:  BP 140/80   Pulse 89   Wt 205 lb 9.6 oz (93.3 kg)   SpO2 99%   BMI 36.42 kg/m  Vitals and nursing note reviewed  General: well nourished, in no acute distress HEENT: normocephalic, TM's visualized bilaterally, PERRL, EOMI, no scleral icterus or conjunctival pallor, no nasal discharge, moist mucous membranes, good dentition without erythema or discharge noted in posterior oropharynx, uvula midline  Neck: supple, non-tender, without lymphadenopathy Cardiac: RRR, clear S1 and S2, no murmurs, rubs, or gallops Respiratory: clear to auscultation bilaterally, no increased work of breathing Abdomen: soft, nontender, nondistended, no masses or organomegaly. Bowel sounds present Extremities: no edema or cyanosis. Warm, well perfused. 2+ radial pulses bilaterally Skin: warm and dry, no rashes noted Neuro: alert and oriented, no focal deficits, CN2-12 intact, sensation intact, normal grip strength, 4/5 muscle strength but 2/2 poor effort, able to ambulate in room and to door, normal  speech    Assessment & Plan:    DM type 2 with diabetic peripheral neuropathy ([facility]) Poor control 2/2 non compliance. Patient was prescribed victoza at last visit and did not pick up prescription. Well plan to re-prescribe victoza. Advised to start at 0.6 for 1 week and increase to 1.2 if tolerating it well. Advised to continue lantus 40U bid, hopefully with better control with victoza we can either decrease insulin or A1C will be within goal  with current plan. Discussed GI side effects and advised to notify us if this occurs. Discussed importance of checking CBGs especially while on insulin. Advised to check 4 times a day and keep a log to bring in at next appointment. Discussed healthy diet and daily exercise. Will place referral for eye exam. Foot exam UTD. Will obtain BL labs including BMP and CBC. RX for new glucometer given. Follow up in 1 month  Essential hypertension Normotensive despite being non compliant on medications. I suspect she does not need as many medications. Given that she is well controlled on carvedilol and amlodipine alone will advise to continue this. Will discontinue losartan, HCTZ and spironolactone to avoid confusion with medications. If further medication needed would start with adding back losartan for renal benefit, did not tolerate ACEIs 2/2 cough. Advised to check BP at home. Advised healthy diet and daily exercise. Follow up in 1 month   Stuttering Patient with normal speech in room. Unclear etiology. Normal neuro exam so unlikely neurological in origin. strict return precautions given. Follow up if no improvement   Diabetic peripheral neuropathy Pain likely 2/2 diabetic neuropathy. Patient poorly controlled with elevated A1C. Pain is burning in sensation. Will increase gabapentin to 300 mg qhs from 100mg . Strict return precautions given. Follow up in 1 month, sooner if worsening   Patient with multiple complaints, advised to follow up in 1 week to ensure she is doing well Return in about 1 week (around 03/09/2019).   [name], DO, PGY-3

## 2019-03-02 NOTE — Patient Instructions (Addendum)
  Diet Recommendations for Diabetes   Starchy (carb) foods: Bread, rice, pasta, potatoes, corn, cereal, grits, crackers, bagels, muffins, all baked goods.  (Fruits, milk, and yogurt also have carbohydrate, but most of these foods will not spike your blood sugar as the starchy foods will.)  A few fruits do cause high blood sugars; use small portions of bananas (limit to 1/2 at a time), grapes, watermelon, oranges, and most tropical fruits.    Protein foods: Meat, fish, poultry, eggs, dairy foods, and beans such as pinto and kidney beans (beans also provide carbohydrate).   1. Eat at least 3 meals and 1-2 snacks per day. Never go more than 4-5 hours while awake without eating. Eat breakfast within the first hour of getting up.   2. Limit starchy foods to TWO per meal and ONE per snack. ONE portion of a starchy  food is equal to the following:   - ONE slice of bread (or its equivalent, such as half of a hamburger bun).   - 1/2 cup of a "scoopable" starchy food such as potatoes or rice.   - 15 grams of carbohydrate as shown on food label.  3. Include at every meal: a protein food, a carb food, and vegetables and/or fruit.   - Obtain twice the volume of veg's as protein or carbohydrate foods for both lunch and dinner.   - Fresh or frozen veg's are best.   - Keep frozen veg's on hand for a quick vegetable serving.        For your diabetes: take lantus as well as victoza Start with 0.6 for 1 week and then increase to 1.2.  I want you to follow-up in 1 month for your diabetes.  Please check your sugars 4 times daily, fasting and postprandial as well.  For your high blood pressure: I have discontinued your losartan, hydrochlorothiazide, spironolactone.  Continue carvedilol and amlodipine.  Your blood pressure was within normal limits today so I think it is well controlled on these 2 alone.  For your pain: I have increased her gabapentin to 300 mg at night.  This may make you very sleepy as this is a  strong pain medication.  I need you to follow-up in 1 week's you have had several medical problems today and I want to make sure you are doing well.

## 2019-03-03 DIAGNOSIS — F8081 Childhood onset fluency disorder: Secondary | ICD-10-CM | POA: Insufficient documentation

## 2019-03-03 LAB — BASIC METABOLIC PANEL
BUN/Creatinine Ratio: 14 (ref 12–28)
BUN: 12 mg/dL (ref 8–27)
CO2: 22 mmol/L (ref 20–29)
Calcium: 9.5 mg/dL (ref 8.7–10.3)
Chloride: 102 mmol/L (ref 96–106)
Creatinine, Ser: 0.86 mg/dL (ref 0.57–1.00)
GFR calc Af Amer: 79 mL/min/{1.73_m2} (ref >59)
GFR calc non Af Amer: 69 mL/min/{1.73_m2} (ref >59)
Glucose: 154 mg/dL — ABNORMAL HIGH (ref 65–99)
Potassium: 5 mmol/L (ref 3.5–5.2)
Sodium: 140 mmol/L (ref 134–144)

## 2019-03-03 LAB — CBC
Hematocrit: 39.6 % (ref 34.0–46.6)
Hemoglobin: 13.3 g/dL (ref 11.1–15.9)
MCH: 28.6 pg (ref 26.6–33.0)
MCHC: 33.6 g/dL (ref 31.5–35.7)
MCV: 85 fL (ref 79–97)
Platelets: 158 10*3/uL (ref 150–450)
RBC: 4.65 x10E6/uL (ref 3.77–5.28)
RDW: 12.7 % (ref 11.7–15.4)
WBC: 5.7 10*3/uL (ref 3.4–10.8)

## 2019-03-03 NOTE — Assessment & Plan Note (Addendum)
Poor control 2/2 non compliance. Patient was prescribed victoza at last visit and did not pick up prescription. Well plan to re-prescribe victoza. Advised to start at 0.6 for 1 week and increase to 1.2 if tolerating it well. Advised to continue lantus 40U bid, hopefully with better control with victoza we can either decrease insulin or A1C will be within goal with current plan. Discussed GI side effects and advised to notify us if this occurs. Discussed importance of checking CBGs especially while on insulin. Advised to check 4 times a day and keep a log to bring in at next appointment. Discussed healthy diet and daily exercise. Will place referral for eye exam. Foot exam UTD. Will obtain BL labs including BMP and CBC. RX for new glucometer given. Follow up in 1 month

## 2019-03-03 NOTE — Assessment & Plan Note (Signed)
Patient with normal speech in room. Unclear etiology. Normal neuro exam so unlikely neurological in origin. strict return precautions given. Follow up if no improvement

## 2019-03-03 NOTE — Assessment & Plan Note (Signed)
Normotensive despite being non compliant on medications. I suspect she does not need as many medications. Given that she is well controlled on carvedilol and amlodipine alone will advise to continue this. Will discontinue losartan, HCTZ and spironolactone to avoid confusion with medications. If further medication needed would start with adding back losartan for renal benefit, did not tolerate ACEIs 2/2 cough. Advised to check BP at home. Advised healthy diet and daily exercise. Follow up in 1 month

## 2019-03-03 NOTE — Assessment & Plan Note (Signed)
Pain likely 2/2 diabetic neuropathy. Patient poorly controlled with elevated A1C. Pain is burning in sensation. Will increase gabapentin to 300 mg qhs from 100mg . Strict return precautions given. Follow up in 1 month, sooner if worsening

## 2019-03-04 NOTE — Telephone Encounter (Signed)
Clarified information during previous appointment. Form completed and placed in RN inbox.   Please call patient to inform her it is ready to pick up.  Dalphine Handing, PGY-3 [facility] 03/04/2019 12:31 PM

## 2019-03-04 NOTE — Telephone Encounter (Signed)
Patients granddaughter informed of form ready for pick up.

## 2019-03-08 ENCOUNTER — Encounter: Payer: Self-pay | Admitting: Family Medicine

## 2019-03-08 NOTE — Progress Notes (Signed)
Letter sent with lab results as we were unable to get in touch with her via the phone  [name], DO, PGY-3 [facility] 03/08/2019 8:54 AM

## 2019-03-09 ENCOUNTER — Other Ambulatory Visit: Payer: Self-pay

## 2019-03-09 DIAGNOSIS — E1142 Type 2 diabetes mellitus with diabetic polyneuropathy: Secondary | ICD-10-CM

## 2019-03-09 MED ORDER — ONETOUCH DELICA LANCING DEV MISC: 1.0000 | Freq: Four times a day (QID) | 1 refills | Status: DC

## 2019-03-14 ENCOUNTER — Encounter: Payer: Self-pay | Admitting: Orthopaedic Surgery

## 2019-03-14 ENCOUNTER — Other Ambulatory Visit: Payer: Self-pay

## 2019-03-14 ENCOUNTER — Ambulatory Visit (INDEPENDENT_AMBULATORY_CARE_PROVIDER_SITE_OTHER): Payer: Medicare Other | Admitting: Orthopaedic Surgery

## 2019-03-14 DIAGNOSIS — M5441 Lumbago with sciatica, right side: Secondary | ICD-10-CM

## 2019-03-14 DIAGNOSIS — M5442 Lumbago with sciatica, left side: Secondary | ICD-10-CM | POA: Diagnosis not present

## 2019-03-14 DIAGNOSIS — G8929 Other chronic pain: Secondary | ICD-10-CM | POA: Diagnosis not present

## 2019-03-14 DIAGNOSIS — M545 Low back pain, unspecified: Secondary | ICD-10-CM

## 2019-03-14 MED ORDER — HYDROCODONE-ACETAMINOPHEN 5-325 MG PO TABS: 1.0000 | ORAL_TABLET | Freq: Four times a day (QID) | ORAL | 0 refills | Status: DC | PRN

## 2019-03-14 NOTE — Progress Notes (Signed)
The patient is a 68 year old female that I have not seen in a year.  She comes in today to go over an MRI that we had done of her back last year.  She ambulates with a cane.  She is moderately obese.  She is a poorly controlled diabetic.  I see through epic that her hemoglobin A1c 12 days ago was 8.2.  She has chronic pain in her lumbar spine and radiating down both legs.  MRI is reviewed with her.  It does show multifactorial stenosis at multiple levels from L2-L3 all the way down to L5-S1 there is moderate to severe on both the right and left at several levels.  Some of this is from a disc bulge at each level combined with facet hypertrophy.  This is causing some central stenosis and foraminal stenosis again at multiple levels.  I talked her about the MRI and what this means.  There is nothing else that I can provide her other than either referral to a spine specialist versus injections versus chronic pain management.  She is not interested in surgery.  Given her hemoglobin A1c being above 8, steroid injections may detrimentally affect her blood glucose levels.  I did provide her a one-time prescription for hydrocodone.  We will make a referral to pain management for her.  If she wants to be seen by a spine specialist we can at least have either Dr. Donavan Burnet or Dr. Lorin Mercy either here for further action and treatment.

## 2019-03-21 NOTE — Progress Notes (Deleted)
   Subjective:    Patient ID: [name] [name], female    DOB: 10/15/1950, 68 y.o.   MRN: [id number]   CC:  HPI: Hypertension: - Medications: carvedilol 25, amlodipine 10 - Compliance: *** - Checking BP at home: *** - Denies any SOB, CP, vision changes, LE edema, medication SEs, or symptoms of hypotension - Diet: *** - Exercise: ***  Fasting checks: *** Post prandial ***  Compliance: *** Diet: ***  Exercise: *** Eye exam: *** Foot exam: *** A1C: *** Symptoms: *** symptoms of hypoglycemia. *** symptoms of  polyuria, polydipsia. *** numbness in extremities, and *** foot ulcers/trauma Meds: victoza, lantus 40U bid  Pneumonia vaccine: *** Urine micro albumin:creatine ratio: ***  Monitoring Labs and Parameters Last A1C:  Lab Results  Component Value Date   HGBA1C 8.2 (A) 03/02/2019   Last Lipid:     Component Value Date/Time   CHOL 141 04/22/2018 1431   HDL 45 04/22/2018 1431   LDLDIRECT 62.5 12/01/2011 0832   Last Bmet  Potassium  Date Value Ref Range Status  03/02/2019 5.0 3.5 - 5.2 mmol/L Final   Sodium  Date Value Ref Range Status  03/02/2019 140 134 - 144 mmol/L Final   Creat  Date Value Ref Range Status  03/19/2016 0.75 0.50 - 0.99 mg/dL Final    Comment:      For patients > or = 70 years of age: The upper reference limit for Creatinine is approximately 13% higher for people identified as African-American.      Creatinine, Ser  Date Value Ref Range Status  03/02/2019 0.86 0.57 - 1.00 mg/dL Final       Smoking status reviewed  Review of Systems   Objective:  There were no vitals taken for this visit. Vitals and nursing note reviewed  General: well nourished, in no acute distress HEENT: normocephalic, TM's visualized bilaterally, no scleral icterus or conjunctival pallor, no nasal discharge, moist mucous membranes, good dentition without erythema or discharge noted in posterior oropharynx Neck: supple, non-tender, without lymphadenopathy  Cardiac: RRR, clear S1 and S2, no murmurs, rubs, or gallops Respiratory: clear to auscultation bilaterally, no increased work of breathing Abdomen: soft, nontender, nondistended, no masses or organomegaly. Bowel sounds present Extremities: no edema or cyanosis. Warm, well perfused. 2+ radial and PT pulses bilaterally Skin: warm and dry, no rashes noted Neuro: alert and oriented, no focal deficits   Assessment & Plan:    No problem-specific Assessment & Plan notes found for this encounter.    No follow-ups on file.   [name], DO, PGY-3

## 2019-03-22 ENCOUNTER — Ambulatory Visit: Payer: Medicare Other | Admitting: Family Medicine

## 2019-03-26 NOTE — Progress Notes (Deleted)
   Subjective:    Patient ID: [name], female    DOB: 11-Aug-1950, 68 y.o.   MRN: [id number]   CC:  HPI: Hypertension: - Medications: carvedilol 25, amlodipine 10 - Compliance: *** - Checking BP at home: *** - Denies any SOB, CP, vision changes, LE edema, medication SEs, or symptoms of hypotension - Diet: *** - Exercise: ***  Diabetes Fasting checks: *** Post prandial ***  Compliance: *** Diet: ***  Exercise: *** Eye exam: *** Foot exam: *** A1C: *** Symptoms: *** symptoms of hypoglycemia. *** symptoms of  polyuria, polydipsia. *** numbness in extremities, and *** foot ulcers/trauma Meds: victoza,lantus 40U bid Pneumonia vaccine: *** Urine micro albumin:creatine ratio: ***  Monitoring Labs and Parameters Last A1C:  Lab Results  Component Value Date   HGBA1C 8.2 (A) 03/02/2019   Last Lipid:     Component Value Date/Time   CHOL 141 04/22/2018 1431   HDL 45 04/22/2018 1431   LDLDIRECT 62.5 12/01/2011 0832   Last Bmet  Potassium  Date Value Ref Range Status  03/02/2019 5.0 3.5 - 5.2 mmol/L Final   Sodium  Date Value Ref Range Status  03/02/2019 140 134 - 144 mmol/L Final   Creat  Date Value Ref Range Status  03/19/2016 0.75 0.50 - 0.99 mg/dL Final    Comment:      For patients > or = 70 years of age: The upper reference limit for Creatinine is approximately 13% higher for people identified as African-American.      Creatinine, Ser  Date Value Ref Range Status  03/02/2019 0.86 0.57 - 1.00 mg/dL Final      Smoking status reviewed  Review of Systems   Objective:  There were no vitals taken for this visit. Vitals and nursing note reviewed  General: well nourished, in no acute distress HEENT: normocephalic, TM's visualized bilaterally, no scleral icterus or conjunctival pallor, no nasal discharge, moist mucous membranes, good dentition without erythema or discharge noted in posterior oropharynx Neck: supple, non-tender, without  lymphadenopathy Cardiac: RRR, clear S1 and S2, no murmurs, rubs, or gallops Respiratory: clear to auscultation bilaterally, no increased work of breathing Abdomen: soft, nontender, nondistended, no masses or organomegaly. Bowel sounds present Extremities: no edema or cyanosis. Warm, well perfused. 2+ radial and PT pulses bilaterally Skin: warm and dry, no rashes noted Neuro: alert and oriented, no focal deficits   Assessment & Plan:    No problem-specific Assessment & Plan notes found for this encounter.    No follow-ups on file.   [name], DO, PGY-3

## 2019-03-28 ENCOUNTER — Ambulatory Visit: Payer: Medicare Other | Admitting: Family Medicine

## 2019-03-31 ENCOUNTER — Other Ambulatory Visit: Payer: Self-pay | Admitting: Family Medicine

## 2019-04-03 NOTE — Progress Notes (Deleted)
   Subjective:    Patient ID: [name], female    DOB: [birth year], 68 y.o.   MRN: [id number]   CC:  Hypertension: - Medications:carvedilol25, amlodipine10 - Compliance: *** - Checking BP at home: *** - Denies any SOB, CP, vision changes, LE edema, medication SEs, or symptoms of hypotension - Diet: *** - Exercise: ***  Diabetes Fasting checks: *** Post prandial ***  Compliance: *** Diet: ***  Exercise: *** Eye exam: *** Foot exam: *** A1C: *** Symptoms: *** symptoms of hypoglycemia. *** symptoms of  polyuria, polydipsia. *** numbness in extremities, and *** foot ulcers/trauma Meds: victoza,lantus 40U bid Pneumonia vaccine: *** Urine micro albumin:creatine ratio: ***  Monitoring Labs and Parameters Last A1C:  Lab Results  Component Value Date   HGBA1C 8.2 (A) 03/02/2019   Last Lipid:     Component Value Date/Time   CHOL 141 04/22/2018 1431   HDL 45 04/22/2018 1431   LDLDIRECT 62.5 12/01/2011 0832   Last Bmet  Potassium  Date Value Ref Range Status  03/02/2019 5.0 3.5 - 5.2 mmol/L Final   Sodium  Date Value Ref Range Status  03/02/2019 140 134 - 144 mmol/L Final   Creat  Date Value Ref Range Status  03/19/2016 0.75 0.50 - 0.99 mg/dL Final    Comment:      For patients > or = 70 years of age: The upper reference limit for Creatinine is approximately 13% higher for people identified as African-American.      Creatinine, Ser  Date Value Ref Range Status  03/02/2019 0.86 0.57 - 1.00 mg/dL Final        Objective:  There were no vitals taken for this visit. Vitals and nursing note reviewed  General: well nourished, in no acute distress HEENT: normocephalic, TM's visualized bilaterally, no scleral icterus or conjunctival pallor, no nasal discharge, moist mucous membranes, good dentition without erythema or discharge noted in posterior oropharynx Neck: supple, non-tender, without lymphadenopathy Cardiac: RRR, clear S1 and S2, no murmurs,  rubs, or gallops Respiratory: clear to auscultation bilaterally, no increased work of breathing Abdomen: soft, nontender, nondistended, no masses or organomegaly. Bowel sounds present Extremities: no edema or cyanosis. Warm, well perfused. 2+ radial and PT pulses bilaterally Skin: warm and dry, no rashes noted Neuro: alert and oriented, no focal deficits   Assessment & Plan:    No problem-specific Assessment & Plan notes found for this encounter.    No follow-ups on file.   [name], DO, PGY-3

## 2019-04-05 ENCOUNTER — Other Ambulatory Visit: Payer: Self-pay

## 2019-04-05 ENCOUNTER — Ambulatory Visit: Payer: Medicare Other | Admitting: Family Medicine

## 2019-04-06 ENCOUNTER — Ambulatory Visit: Payer: Medicare Other

## 2019-04-07 ENCOUNTER — Ambulatory Visit: Payer: Medicare Other | Admitting: Family Medicine

## 2019-04-11 ENCOUNTER — Ambulatory Visit: Payer: Medicare Other

## 2019-04-13 ENCOUNTER — Ambulatory Visit: Payer: Medicare Other

## 2019-04-25 ENCOUNTER — Other Ambulatory Visit: Payer: Self-pay

## 2019-04-25 ENCOUNTER — Encounter: Payer: Self-pay | Admitting: Family Medicine

## 2019-04-25 ENCOUNTER — Ambulatory Visit (INDEPENDENT_AMBULATORY_CARE_PROVIDER_SITE_OTHER): Payer: Medicare Other | Admitting: Family Medicine

## 2019-04-25 VITALS — BP 200/102 | HR 105 | Wt 203.8 lb

## 2019-04-25 DIAGNOSIS — F323 Major depressive disorder, single episode, severe with psychotic features: Secondary | ICD-10-CM

## 2019-04-25 DIAGNOSIS — I10 Essential (primary) hypertension: Secondary | ICD-10-CM | POA: Diagnosis not present

## 2019-04-25 DIAGNOSIS — F32A Depression, unspecified: Secondary | ICD-10-CM

## 2019-04-25 MED ORDER — QUETIAPINE FUMARATE 50 MG PO TABS: 50.0000 mg | ORAL_TABLET | Freq: Every day | ORAL | 2 refills | Status: DC

## 2019-04-25 MED ORDER — LOSARTAN POTASSIUM 25 MG PO TABS: 25.0000 mg | ORAL_TABLET | Freq: Every day | ORAL | 3 refills | Status: DC

## 2019-04-25 NOTE — Assessment & Plan Note (Signed)
History of depression that was well controlled with Seroquel. Patient thinks she is Bipolar however after chart review I do not see any history of her being Bipolar.  - Will restart Seroquel today and follow up in two weeks. After checking records I cannot confirm she is Bipolar so will most likely not continue this medication.  - Recheck PHQ-9 in two weeks - Prefer to start her on an SSRI which is first line. Will most likely stop Seroquel and restart a SSRI like Cymbalta at that time.

## 2019-04-25 NOTE — Progress Notes (Signed)
Subjective: Chief Complaint  Patient presents with  . Back Pain    HPI: [name] [name] is a 69 y.o. presenting to clinic today to discuss the following:  HTN Patient has PMH of HTN, DMT2, CAD presenting today for follow up for her HTN. She was recently stopped on HCTZ, Losartan, and Spironolactone as she appeared to be normotensive while not taking these medications. Patient states today she has been checking her BP at home and it has been over [id number] systolic consistently. It is elevated today on recheck to 170/100 and then 165/97. She is taking amlodipine and Carvedilol as prescribed. Today in clinic she reports no headache, blurry vision, chest pain, or difficulty breathing. However, she states intermittently at home sometimes she does experience these symptoms.  Depression Patient has been feeling depressed, lack of energy, lack of motivation, poor sleep, with poor apetite "for some time now". She states she was well controlled on Seroquel for a long time but it was stopped and she does not know why. She states she hasn't been on any medication "for years" and her PHQ-9 score was 23 today. She denies SI or HI.  ROS noted in HPI.    Social History   Tobacco Use  Smoking Status Current Some Day Smoker  . Packs/day: 0.20  . Years: 30.00  . Pack years: 6.00  . Types: Cigarettes  . Start date: 04/22/1975  Smokeless Tobacco Former User  Tobacco Comment   started at age 21 - quit for several years.  Restarted with death of child.  recently quit for days at a time.  currently reports 0-3 cigs per day. recent in crease to 1/2 pack d/t death in family   Objective: BP (!) 200/102   Pulse (!) 105   Wt 203 lb 12.8 oz (92.4 kg)   SpO2 98%   BMI 36.10 kg/m  Vitals and nursing notes reviewed  Physical Exam Gen: Alert and Oriented x 3, NAD CV: RRR, no murmurs, normal S1, S2 split Resp: CTAB, no wheezing, rales, or rhonchi, comfortable work of breathing Ext: no clubbing, cyanosis, or  edema Skin: warm, dry, intact, no rashes  Assessment/Plan:  Essential hypertension Poorly controlled at today's visit with repeat BP of 170/100 and 160/97. Patient also states she has these numbers at home. Discussed when she has BP over [id number] systolic with symptoms that doesn't go down with meds to seek medical care. - Cont Amlodipine 10mg  daily - Cont Carvedilol 50mg  daily - Restart Losartan 25mg  daily and check BMP in two weeks  DEPRESSION, MAJOR, WITH PSYCHOTIC BEHAVIOR History of depression that was well controlled with Seroquel. Patient thinks she is Bipolar however after chart review I do not see any history of her being Bipolar.  - Will restart Seroquel today and follow up in two weeks. After checking records I cannot confirm she is Bipolar so will most likely not continue this medication.  - Recheck PHQ-9 in two weeks - Prefer to start her on an SSRI which is first line. Will most likely stop Seroquel and restart a SSRI like Cymbalta at that time.   PATIENT EDUCATION PROVIDED: See AVS    Diagnosis and plan along with any newly prescribed medication(s) were discussed in detail with this patient today. The patient verbalized understanding and agreed with the plan. Patient advised if symptoms worsen return to clinic or ER.    Orders Placed This Encounter  Procedures  . Basic Metabolic Panel    Standing Status:   Future  Standing Expiration Date:   07/24/2019    Meds ordered this encounter  Medications  . QUEtiapine (SEROQUEL) 50 MG tablet    Sig: Take 1 tablet (50 mg total) by mouth at bedtime.    Dispense:  30 tablet    Refill:  2  . losartan (COZAAR) 25 MG tablet    Sig: Take 1 tablet (25 mg total) by mouth at bedtime.    Dispense:  90 tablet    Refill:  [place], DO 04/25/2019, 2:19 PM PGY-3 [place]

## 2019-04-25 NOTE — Assessment & Plan Note (Signed)
Poorly controlled at today's visit with repeat BP of 170/100 and 160/97. Patient also states she has these numbers at home. Discussed when she has BP over [id number] systolic with symptoms that doesn't go down with meds to seek medical care. - Cont Amlodipine 10mg  daily - Cont Carvedilol 50mg  daily - Restart Losartan 25mg  daily and check BMP in two weeks

## 2019-04-25 NOTE — Progress Notes (Deleted)
     Subjective: Chief Complaint  Patient presents with  . Back Pain     HPI: Xariyah Chao is a 69 y.o. presenting to clinic today to discuss the following:  1 2 3   Health Maintenance: ***     ROS noted in HPI.    Social History   Tobacco Use  Smoking Status Current Some Day Smoker  . Packs/day: 0.20  . Years: 30.00  . Pack years: 6.00  . Types: Cigarettes  . Start date: 04/22/1975  Smokeless Tobacco Former User  Tobacco Comment   started at age 50 - quit for several years.  Restarted with death of child.  recently quit for days at a time.  currently reports 0-3 cigs per day. recent in crease to 1/2 pack d/t death in family      Objective: BP (!) 200/102   Pulse (!) 105   Wt 203 lb 12.8 oz (92.4 kg)   SpO2 98%   BMI 36.10 kg/m  Vitals and nursing notes reviewed  Physical Exam   No results found for this or any previous visit (from the past 53 hour(s)).  Assessment/Plan:  No problem-specific Assessment & Plan notes found for this encounter.     PATIENT EDUCATION PROVIDED: See AVS    Diagnosis and plan along with any newly prescribed medication(s) were discussed in detail with this patient today. The patient verbalized understanding and agreed with the plan. Patient advised if symptoms worsen return to clinic or ER.   Health Maintainance:   Orders Placed This Encounter  Procedures  . Basic Metabolic Panel    Standing Status:   Future    Standing Expiration Date:   07/24/2019    Meds ordered this encounter  Medications  . QUEtiapine (SEROQUEL) 50 MG tablet    Sig: Take 1 tablet (50 mg total) by mouth at bedtime.    Dispense:  30 tablet    Refill:  2  . losartan (COZAAR) 25 MG tablet    Sig: Take 1 tablet (25 mg total) by mouth at bedtime.    Dispense:  90 tablet    Refill:  [place], DO 04/25/2019, 2:40 PM PGY-3 [place]

## 2019-04-25 NOTE — Patient Instructions (Addendum)
It was great to meet you today! Thank you for letting me participate in your care!  Today, we discussed your depression and blood pressure. I have started you on Seroquel for your depression and I want to see you in two weeks time.  For your blood pressure I am restarting you on Losartan as it is not well controlled today.  Be well, Harolyn Rutherford, DO PGY-3, [facility]

## 2019-05-04 ENCOUNTER — Telehealth: Payer: Self-pay | Admitting: *Deleted

## 2019-05-04 ENCOUNTER — Telehealth: Payer: Self-pay

## 2019-05-04 NOTE — Telephone Encounter (Signed)
Attempted to call patient to pick up completed SCAT form.  There was no answer and voice mail is full.  Will try calling later.  [name] [name], [place]

## 2019-05-04 NOTE — Telephone Encounter (Signed)
-----   Message from Nuala Alpha, DO sent at 05/04/2019  9:38 AM EST ----- Regarding: SCAT Application compelte SCAT application has been completed for patient and left in folder behind the front desk according to last name. Can we please call her and let her know it has been filled out? Thanks!  Tim

## 2019-05-04 NOTE — Telephone Encounter (Signed)
Pt informed and will pick it up Monday since she has an appt. Edy Mcbane Kennon Holter, CMA

## 2019-05-05 ENCOUNTER — Other Ambulatory Visit: Payer: Self-pay | Admitting: Family Medicine

## 2019-05-05 DIAGNOSIS — E1142 Type 2 diabetes mellitus with diabetic polyneuropathy: Secondary | ICD-10-CM

## 2019-05-06 ENCOUNTER — Telehealth: Payer: Self-pay

## 2019-05-06 NOTE — Telephone Encounter (Signed)
Called pt to screen for COVID before appt on Monday. No answer, no VM set up. [name] [name], CMA

## 2019-05-09 ENCOUNTER — Ambulatory Visit (INDEPENDENT_AMBULATORY_CARE_PROVIDER_SITE_OTHER): Payer: Medicare HMO | Admitting: Family Medicine

## 2019-05-09 ENCOUNTER — Other Ambulatory Visit: Payer: Self-pay

## 2019-05-09 VITALS — BP 138/70 | HR 91 | Wt 205.6 lb

## 2019-05-09 DIAGNOSIS — Z23 Encounter for immunization: Secondary | ICD-10-CM | POA: Diagnosis not present

## 2019-05-09 DIAGNOSIS — F323 Major depressive disorder, single episode, severe with psychotic features: Secondary | ICD-10-CM | POA: Diagnosis not present

## 2019-05-09 DIAGNOSIS — G894 Chronic pain syndrome: Secondary | ICD-10-CM

## 2019-05-09 DIAGNOSIS — L910 Hypertrophic scar: Secondary | ICD-10-CM | POA: Insufficient documentation

## 2019-05-09 DIAGNOSIS — I10 Essential (primary) hypertension: Secondary | ICD-10-CM

## 2019-05-09 DIAGNOSIS — F32A Depression, unspecified: Secondary | ICD-10-CM

## 2019-05-09 DIAGNOSIS — B372 Candidiasis of skin and nail: Secondary | ICD-10-CM

## 2019-05-09 DIAGNOSIS — J320 Chronic maxillary sinusitis: Secondary | ICD-10-CM

## 2019-05-09 DIAGNOSIS — Z0000 Encounter for general adult medical examination without abnormal findings: Secondary | ICD-10-CM

## 2019-05-09 MED ORDER — ALBUTEROL SULFATE (2.5 MG/3ML) 0.083% IN NEBU: 2.5000 mg | INHALATION_SOLUTION | Freq: Four times a day (QID) | RESPIRATORY_TRACT | 3 refills | Status: DC | PRN

## 2019-05-09 MED ORDER — HYDROXYZINE HCL 10 MG PO TABS: 10.0000 mg | ORAL_TABLET | Freq: Three times a day (TID) | ORAL | 0 refills | Status: DC | PRN

## 2019-05-09 MED ORDER — NYSTATIN 100000 UNIT/GM EX POWD: Freq: Three times a day (TID) | CUTANEOUS | 3 refills | Status: DC

## 2019-05-09 MED ORDER — AMOXICILLIN-POT CLAVULANATE 875-125 MG PO TABS: 1.0000 | ORAL_TABLET | Freq: Two times a day (BID) | ORAL | 0 refills | Status: DC

## 2019-05-09 NOTE — Assessment & Plan Note (Signed)
I think her headache is coming from her acute on chronic sinusitis. Will try antibiotics - Augmentin 10 days BID

## 2019-05-09 NOTE — Assessment & Plan Note (Signed)
At goal today. Cont current regimen. - Cont amlodipine 10mg  daily - Cont Losartan 25mg  daily - Cont Carvedilol 25mg  daily

## 2019-05-09 NOTE — Patient Instructions (Addendum)
It was great to see you today! Thank you for letting me participate in your care!  Today, we discussed a lot of things. For your headache and sinus pressure I have started you on an antibiotic for suspected sinusitis. I have also refilled your albuterol inhaler as well.   I have referred you to Psych for further management of your bipolar depression and management of Seroquel.   I have sent in referrals for your colonoscopy and your mammogram.  Be well, [name] Rutherford, DO PGY-3, [place]

## 2019-05-09 NOTE — Progress Notes (Signed)
Subjective:  HPI: [name] [name] is a 69 y.o. presenting to clinic today to discuss the following:  Headache and Sinus pressure Patient states she is having headaches that are like ones she has had in the past when she has had a "sinus infection". She states it started 7 days ago is intermittent, occurring multiple times every day lasting minutes to sometimes "all day". She has tried zyrtec, tylenol, excerdin, and ibuprofen with little to no help in symptoms. She states if feels like a needle is sticking into her head and she has associated sinus pressure, pain, and tenderness with nausea, no vomiting, photophobia, phonophobia, non-productive cough, rhinorrhea with greenish yellow sputum drainage from the nares.  HTN Follow Up Patient returns for follow up of her HTN. Today it is well controlled. She states she has been taking Losartan as prescribed and today it is to goal. Will repeat BMET today.  Depression Follow Up At last visit patient reported she was bipolar, had a history of depression and was meeting criteria for a major depressive episode without SI or HI. However, after looking through her charts I was not able to confirm she has ever been diagnosed by a physician as having bipolar disorder. At her last appointment she stated she had a good response to seroquel and was taking it in the past. I started her on Seroquel but after discussion with attending physicians felt like she should be seen by Psych for further follow up and management.  ROS noted in HPI.    Social History   Tobacco Use  Smoking Status Current Some Day Smoker  . Packs/day: 0.20  . Years: 30.00  . Pack years: 6.00  . Types: Cigarettes  . Start date: 04/22/1975  Smokeless Tobacco Former User  Tobacco Comment   started at age 27 - quit for several years.  Restarted with death of child.  recently quit for days at a time.  currently reports 0-3 cigs per day. recent in crease to 1/2 pack d/t death in family     Objective: BP 138/70   Pulse 91   Wt 205 lb 9.6 oz (93.3 kg)   SpO2 98%   BMI 36.42 kg/m  Vitals and nursing notes reviewed  Physical Exam Gen: Alert and Oriented x 3, NAD HEENT: Normocephalic, atraumatic, PERRLA, EOMI, TM visible with good light reflex, swollen erythematous turbinates, maxillary sinuses tender to percussion, non-erythematous pharyngeal mucosa, no exudates Neck: no LAD CV: RRR, no murmurs, normal S1, S2 split Resp: CTAB, no wheezing, rales, or rhonchi, comfortable work of breathing Abd: non-distended, non-tender, soft, +bs in all four quadrants Ext: no clubbing, cyanosis, or edema Neuro: No gross deficits Skin: warm, dry, intact, no rashes  Assessment/Plan:  Essential hypertension At goal today. Cont current regimen. - Cont amlodipine 10mg  daily - Cont Losartan 25mg  daily - Cont Carvedilol 25mg  daily  Sinusitis, chronic I think her headache is coming from her acute on chronic sinusitis. Will try antibiotics - Augmentin 10 days BID  DEPRESSION, MAJOR, WITH PSYCHOTIC BEHAVIOR Expressed my concerns with patient about being on Seroquel with no official diagnosis of being bipolar and still having depression symptoms. Encouraged that her symptoms have improved on seroquel but needs to see Psychiatry - Referral to Psych - 1 more refill of Seroquel and after that she needs to seek psychiatric care for this medication. Hopefully, they can provide more insight into her official diagnosis.   PATIENT EDUCATION PROVIDED: See AVS    Diagnosis and plan along  with any newly prescribed medication(s) were discussed in detail with this patient today. The patient verbalized understanding and agreed with the plan. Patient advised if symptoms worsen return to clinic or ER.    Orders Placed This Encounter  Procedures  . MM Digital Screening    Standing Status:   Future    Standing Expiration Date:   07/06/2020    Order Specific Question:   Reason for Exam (SYMPTOM  OR  DIAGNOSIS REQUIRED)    Answer:   healthcare maintenance    Order Specific Question:   Preferred imaging location?    Answer:   [facility]  . Flu Vaccine QUAD High Dose(Fluad)  . Tdap vaccine greater than or equal to 7yo IM  . Basic Metabolic Panel  . Ambulatory referral to Pain Clinic    Referral Priority:   Routine    Referral Type:   Consultation    Referral Reason:   Specialty Services Required    Requested Specialty:   Pain Medicine    Number of Visits Requested:   1  . Ambulatory referral to Psychiatry    Referral Priority:   Routine    Referral Type:   Psychiatric    Referral Reason:   Specialty Services Required    Requested Specialty:   Psychiatry    Number of Visits Requested:   1  . Ambulatory referral to Gastroenterology    Referral Priority:   Routine    Referral Type:   Consultation    Referral Reason:   Specialty Services Required    Number of Visits Requested:   1    Meds ordered this encounter  Medications  . DISCONTD: albuterol (PROVENTIL) (2.5 MG/3ML) 0.083% nebulizer solution    Sig: Take 3 mLs (2.5 mg total) by nebulization every 6 (six) hours as needed for wheezing or shortness of breath.    Dispense:  75 mL    Refill:  3  . nystatin (MYCOSTATIN/NYSTOP) powder    Sig: Apply topically 3 (three) times daily. To affected area    Dispense:  30 g    Refill:  3  . albuterol (PROVENTIL) (2.5 MG/3ML) 0.083% nebulizer solution    Sig: Take 3 mLs (2.5 mg total) by nebulization every 6 (six) hours as needed for wheezing or shortness of breath.    Dispense:  75 mL    Refill:  3  . amoxicillin-clavulanate (AUGMENTIN) 875-125 MG tablet    Sig: Take 1 tablet by mouth 2 (two) times daily.    Dispense:  20 tablet    Refill:  0  . hydrOXYzine (ATARAX/VISTARIL) 10 MG tablet    Sig: Take 1 tablet (10 mg total) by mouth 3 (three) times daily as needed.    Dispense:  30 tablet    Refill:  0     Tim Garlan Fillers, DO 05/09/2019, 10:16 AM PGY-3 [place]

## 2019-05-09 NOTE — Assessment & Plan Note (Signed)
Expressed my concerns with patient about being on Seroquel with no official diagnosis of being bipolar and still having depression symptoms. Encouraged that her symptoms have improved on seroquel but needs to see Psychiatry - Referral to Psych - 1 more refill of Seroquel and after that she needs to seek psychiatric care for this medication. Hopefully, they can provide more insight into her official diagnosis.

## 2019-05-10 LAB — BASIC METABOLIC PANEL
BUN/Creatinine Ratio: 10 — ABNORMAL LOW (ref 12–28)
BUN: 9 mg/dL (ref 8–27)
CO2: 24 mmol/L (ref 20–29)
Calcium: 9.7 mg/dL (ref 8.7–10.3)
Chloride: 102 mmol/L (ref 96–106)
Creatinine, Ser: 0.92 mg/dL (ref 0.57–1.00)
GFR calc Af Amer: 73 mL/min/{1.73_m2} (ref >59)
GFR calc non Af Amer: 63 mL/min/{1.73_m2} (ref >59)
Glucose: 136 mg/dL — ABNORMAL HIGH (ref 65–99)
Potassium: 4.5 mmol/L (ref 3.5–5.2)
Sodium: 142 mmol/L (ref 134–144)

## 2019-05-11 ENCOUNTER — Other Ambulatory Visit: Payer: Self-pay | Admitting: Family Medicine

## 2019-05-11 ENCOUNTER — Telehealth: Payer: Self-pay

## 2019-05-11 DIAGNOSIS — J4520 Mild intermittent asthma, uncomplicated: Secondary | ICD-10-CM

## 2019-05-11 MED ORDER — ALBUTEROL SULFATE (2.5 MG/3ML) 0.083% IN NEBU: 2.5000 mg | INHALATION_SOLUTION | Freq: Four times a day (QID) | RESPIRATORY_TRACT | 0 refills | Status: DC | PRN

## 2019-05-11 NOTE — Progress Notes (Signed)
Nebulizer order per patient request as she reports history of asthma requiring use of nebulizer at home that helps with her symptoms.

## 2019-05-11 NOTE — Telephone Encounter (Signed)
Pt left message on nurse line regarding need for nebulizer.Patient was seen on 05/09/19 by Dr. Garlan Fillers. Pt received rx for albuterol, however needs a nebulizer order.  Please advise  Talbot Grumbling, RN

## 2019-05-12 NOTE — Telephone Encounter (Signed)
Called patient and LVM for patient to return phone call in regards to picking up nebulizer.   Talbot Grumbling, RN

## 2019-05-13 ENCOUNTER — Encounter: Payer: Self-pay | Admitting: Physical Medicine and Rehabilitation

## 2019-05-16 ENCOUNTER — Other Ambulatory Visit: Payer: Self-pay | Admitting: Family Medicine

## 2019-05-16 DIAGNOSIS — E1142 Type 2 diabetes mellitus with diabetic polyneuropathy: Secondary | ICD-10-CM

## 2019-05-16 MED ORDER — LANTUS SOLOSTAR 100 UNIT/ML ~~LOC~~ SOPN: PEN_INJECTOR | SUBCUTANEOUS | 0 refills | Status: DC

## 2019-05-16 NOTE — Telephone Encounter (Signed)
Patient calling for 3 reasons:  1. Patient needs refill on her insulin.  2. Patient would like to know if Dr. Garlan Fillers recommends her getting the Covid vaccine given her health status.  3. Patient states she is still having the drainage from her sinuses to her chest. She says she is done with the antibiotic, but is still have this drainage issue.   Please call patient to discuss this. Call back number is [phone].

## 2019-05-23 ENCOUNTER — Other Ambulatory Visit: Payer: Self-pay | Admitting: Family Medicine

## 2019-05-23 ENCOUNTER — Telehealth: Payer: Self-pay

## 2019-05-23 MED ORDER — ACCU-CHEK GUIDE W/DEVICE KIT: 1.0000 | PACK | Freq: Four times a day (QID) | 1 refills | Status: DC

## 2019-05-23 MED ORDER — ACCU-CHEK GUIDE VI STRP: ORAL_STRIP | 12 refills | Status: DC

## 2019-05-23 NOTE — Telephone Encounter (Signed)
Accu Chek Guide device kit and strips sent in.   [name] [name], PGY-3 [facility] 05/23/2019 3:48 PM

## 2019-05-23 NOTE — Telephone Encounter (Signed)
Patient insurance no longer covers One Touch Verio. I called pharmacy and her insurance prefers Accu Chek Guide. Per pharmacist, this sytem will cost her 4 dollars. Please send in Accu Chek Guide device kit.

## 2019-05-26 ENCOUNTER — Telehealth: Payer: Self-pay

## 2019-05-26 NOTE — Telephone Encounter (Signed)
Patient calls nurse line for 2 reasons. (1) she needs a nebulizer machine for recent solution that was sent in for her. Spoke with Janett Billow and ok for patient to come in and get one of our supply. (2) she would like the ok from PCP to get the covid vaccine, she is worried about side effects.

## 2019-05-27 NOTE — Telephone Encounter (Signed)
Called patient to discuss COVID vaccine and to see if she would like to see me in my [place] clinic. Patient states she would like to switch to Dr. Garlan Fillers as PCP so did not want to discuss vaccine with me.  Dalphine Handing, PGY-3 [facility] 05/27/2019 3:57 PM

## 2019-05-27 NOTE — Telephone Encounter (Signed)
Patient comes by office to pick up her Nebulizer machine. Patient and provider Owens Shark) signed and dated appropriately. Form with demographics faxed to [employer], a copy was given to patient, a copy was put in [employer] folder in the nurse room, and a copy made for batch scanning.

## 2019-05-31 NOTE — Telephone Encounter (Signed)
I just checked my inbox and there is no form. Will attempt to complete when available.   Dalphine Handing, PGY-3 [facility] 05/31/2019 1:22 PM

## 2019-05-31 NOTE — Telephone Encounter (Signed)
GTA  form dropped off for at front desk for completion.  Verified that patient section of form has been completed.  Last DOS/WCC with PCP was01/18/21  Placed form in team folder to be completed by clinical staff.  [name] [name]

## 2019-05-31 NOTE — Telephone Encounter (Signed)
Placed form for SCAT request in PCP's box for completion.  [name] [name], [name] [name]

## 2019-06-06 ENCOUNTER — Ambulatory Visit: Payer: Medicare HMO | Attending: Internal Medicine

## 2019-06-06 ENCOUNTER — Other Ambulatory Visit: Payer: Self-pay

## 2019-06-06 ENCOUNTER — Encounter: Payer: Medicare HMO | Admitting: Physical Medicine and Rehabilitation

## 2019-06-06 DIAGNOSIS — Z23 Encounter for immunization: Secondary | ICD-10-CM

## 2019-06-06 NOTE — Progress Notes (Signed)
   Covid-19 Vaccination Clinic  Name:  Zachary Pelissier    MRN: [id number] DOB: 1950/07/23  06/06/2019  Ms. Nedved was observed post Covid-19 immunization for 15 minutes without incidence. She was provided with Vaccine Information Sheet and instruction to access the V-Safe system.   Ms. Tollefsen was instructed to call 911 with any severe reactions post vaccine: Marland Kitchen Difficulty breathing  . Swelling of your face and throat  . A fast heartbeat  . A bad rash all over your body  . Dizziness and weakness    Immunizations Administered    Name Date Dose VIS Date Route   Moderna COVID-19 Vaccine 06/06/2019 10:09 AM 0.5 mL 03/22/2019 Intramuscular   Manufacturer: Moderna   Lot: [id number]   [place] [id number]

## 2019-06-10 NOTE — Telephone Encounter (Signed)
Pt informed. Hadyn Blanck, CMA  

## 2019-06-22 ENCOUNTER — Other Ambulatory Visit: Payer: Self-pay

## 2019-06-22 ENCOUNTER — Ambulatory Visit: Payer: Medicare HMO | Admitting: Gastroenterology

## 2019-06-22 ENCOUNTER — Encounter: Payer: Self-pay | Admitting: Gastroenterology

## 2019-06-22 VITALS — BP 168/70 | HR 100 | Temp 97.8°F | Ht 63.0 in | Wt 207.2 lb

## 2019-06-22 DIAGNOSIS — Z860100 Personal history of colon polyps, unspecified: Secondary | ICD-10-CM

## 2019-06-22 DIAGNOSIS — R109 Unspecified abdominal pain: Secondary | ICD-10-CM

## 2019-06-22 DIAGNOSIS — R21 Rash and other nonspecific skin eruption: Secondary | ICD-10-CM

## 2019-06-22 DIAGNOSIS — K219 Gastro-esophageal reflux disease without esophagitis: Secondary | ICD-10-CM | POA: Diagnosis not present

## 2019-06-22 DIAGNOSIS — R194 Change in bowel habit: Secondary | ICD-10-CM

## 2019-06-22 DIAGNOSIS — R1310 Dysphagia, unspecified: Secondary | ICD-10-CM

## 2019-06-22 DIAGNOSIS — Z01818 Encounter for other preprocedural examination: Secondary | ICD-10-CM

## 2019-06-22 MED ORDER — CITRUCEL PO POWD: ORAL | Status: DC

## 2019-06-22 MED ORDER — PANTOPRAZOLE SODIUM 40 MG PO TBEC: 40.0000 mg | DELAYED_RELEASE_TABLET | Freq: Every day | ORAL | 2 refills | Status: DC

## 2019-06-22 MED ORDER — SUPREP BOWEL PREP KIT 17.5-3.13-1.6 GM/177ML PO SOLN: ORAL | 0 refills | Status: DC
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
## 2019-06-22 NOTE — Patient Instructions (Addendum)
If you are age 71 or older, your body mass index should be between 23-30. Your Body mass index is 36.7 kg/m. If this is out of the aforementioned range listed, please consider follow up with your Primary Care Provider.  If you are age 100 or younger, your body mass index should be between 19-25. Your Body mass index is 36.7 kg/m. If this is out of the aformentioned range listed, please consider follow up with your Primary Care Provider.   You have been scheduled for an endoscopy and colonoscopy. Please follow the written instructions given to you at your visit today. Please pick up your prep supplies at the pharmacy within the next 1-3 days. If you use inhalers (even only as needed), please bring them with you on the day of your procedure.  We have sent the following medications to your pharmacy for you to pick up at your convenience: Protonix 40 mg: Take once a day  Please purchase the following medications over the counter and take as directed: Citrucel:  Take once a day   We will refer you to Dermatology for your rash.  They will contact you directly about scheduling an appointment.  Thank you for entrusting me with your care and for choosing [facility], Dr. [place] Cellar

## 2019-06-24 ENCOUNTER — Ambulatory Visit
Admission: RE | Admit: 2019-06-24 | Discharge: 2019-06-24 | Disposition: A | Discharge status: Home / Self Care | Payer: Medicare HMO | Source: Ambulatory Visit | Attending: Family Medicine | Admitting: Family Medicine

## 2019-06-24 ENCOUNTER — Other Ambulatory Visit: Payer: Self-pay

## 2019-06-24 DIAGNOSIS — Z0000 Encounter for general adult medical examination without abnormal findings: Secondary | ICD-10-CM

## 2019-06-27 ENCOUNTER — Other Ambulatory Visit: Payer: Self-pay | Admitting: Family Medicine

## 2019-06-27 DIAGNOSIS — L299 Pruritus, unspecified: Secondary | ICD-10-CM

## 2019-06-27 DIAGNOSIS — N644 Mastodynia: Secondary | ICD-10-CM

## 2019-07-04 ENCOUNTER — Encounter: Payer: Self-pay | Admitting: Physical Medicine and Rehabilitation

## 2019-07-05 ENCOUNTER — Ambulatory Visit: Payer: Medicare HMO | Attending: Internal Medicine

## 2019-07-05 ENCOUNTER — Other Ambulatory Visit: Payer: Self-pay

## 2019-07-05 DIAGNOSIS — Z23 Encounter for immunization: Secondary | ICD-10-CM

## 2019-07-05 NOTE — Progress Notes (Signed)
   Covid-19 Vaccination Clinic  Name:  Trevi Revolorio    MRN: [id number] DOB: 1950-12-15  07/05/2019  Ms. Ferderer was observed post Covid-19 immunization for 15 minutes without incident. She was provided with Vaccine Information Sheet and instruction to access the V-Safe system.   Ms. Afolabi was instructed to call 911 with any severe reactions post vaccine: Marland Kitchen Difficulty breathing  . Swelling of face and throat  . A fast heartbeat  . A bad rash all over body  . Dizziness and weakness   Immunizations Administered    Name Date Dose VIS Date Route   Moderna COVID-19 Vaccine 07/05/2019  9:52 AM 0.5 mL 03/22/2019 Intramuscular   Manufacturer: Moderna   Lot: [id number]   [id number]

## 2019-07-06 ENCOUNTER — Encounter: Payer: Medicare HMO | Admitting: Physical Medicine and Rehabilitation

## 2019-07-08 ENCOUNTER — Ambulatory Visit (INDEPENDENT_AMBULATORY_CARE_PROVIDER_SITE_OTHER): Payer: Medicare HMO

## 2019-07-08 ENCOUNTER — Other Ambulatory Visit: Payer: Self-pay | Admitting: Gastroenterology

## 2019-07-08 DIAGNOSIS — Z1159 Encounter for screening for other viral diseases: Secondary | ICD-10-CM

## 2019-07-09 LAB — SARS CORONAVIRUS 2 (TAT 6-24 HRS): SARS Coronavirus 2: NEGATIVE

## 2019-07-12 ENCOUNTER — Other Ambulatory Visit: Payer: Self-pay

## 2019-07-12 ENCOUNTER — Ambulatory Visit (AMBULATORY_SURGERY_CENTER): Payer: Medicare HMO | Admitting: Gastroenterology

## 2019-07-12 ENCOUNTER — Encounter: Payer: Self-pay | Admitting: Gastroenterology

## 2019-07-12 VITALS — BP 145/70 | HR 78 | Temp 96.6°F | Resp 25 | Ht 63.0 in | Wt 207.0 lb

## 2019-07-12 DIAGNOSIS — K317 Polyp of stomach and duodenum: Secondary | ICD-10-CM

## 2019-07-12 DIAGNOSIS — K3189 Other diseases of stomach and duodenum: Secondary | ICD-10-CM

## 2019-07-12 DIAGNOSIS — R1310 Dysphagia, unspecified: Secondary | ICD-10-CM

## 2019-07-12 DIAGNOSIS — K219 Gastro-esophageal reflux disease without esophagitis: Secondary | ICD-10-CM

## 2019-07-12 DIAGNOSIS — R109 Unspecified abdominal pain: Secondary | ICD-10-CM

## 2019-07-12 DIAGNOSIS — K449 Diaphragmatic hernia without obstruction or gangrene: Secondary | ICD-10-CM

## 2019-07-12 DIAGNOSIS — D132 Benign neoplasm of duodenum: Secondary | ICD-10-CM

## 2019-07-12 MED ORDER — FLUCONAZOLE 200 MG PO TABS: ORAL_TABLET | ORAL | 0 refills | Status: DC

## 2019-07-12 MED ORDER — SODIUM CHLORIDE 0.9 % IV SOLN: 500.0000 mL | Freq: Once | INTRAVENOUS | Status: DC

## 2019-07-12 NOTE — Progress Notes (Signed)
Vitals-DT Temp-JB

## 2019-07-12 NOTE — Patient Instructions (Signed)
HANDOUTS PROVIDED ON: HIATAL HERNIA  The polyp removed/biopsies taken today have been sent for pathology.  The results can take 1-3 weeks to receive.    You may resume your previous diet and medication schedule.  A prescription has been sent to your pharmacy.  Thank you for allowing [place] to care for you today!!!   YOU HAD AN ENDOSCOPIC PROCEDURE TODAY AT [place]:   Refer to the procedure report that was given to you for any specific questions about what was found during the examination.  If the procedure report does not answer your questions, please call your gastroenterologist to clarify.  If you requested that your care partner not be given the details of your procedure findings, then the procedure report has been included in a sealed envelope for you to review at your convenience later.  YOU SHOULD EXPECT: Some feelings of bloating in the abdomen. Passage of more gas/belching than usual.  Walking can help get rid of the air that was put into your GI tract during the procedure and reduce the bloating.   Please Note:  You might notice some irritation and congestion in your nose or some drainage.  This is from the oxygen used during your procedure.  There is no need for concern and it should clear up in a day or so.  SYMPTOMS TO REPORT IMMEDIATELY:   Following upper endoscopy (EGD)  Vomiting of blood or coffee ground material  New chest pain or pain under the shoulder blades  Painful or persistently difficult swallowing  New shortness of breath  Fever of 100F or higher  Black, tarry-looking stools  For urgent or emergent issues, a gastroenterologist can be reached at any hour by calling [phone]. Do not use MyChart messaging for urgent concerns.    DIET:  We do recommend a small meal at first, but then you may proceed to your regular diet.  Drink plenty of fluids but you should avoid alcoholic beverages for 24 hours.  ACTIVITY:  You should plan to take it  easy for the rest of today and you should NOT DRIVE or use heavy machinery until tomorrow (because of the sedation medicines used during the test).    FOLLOW UP: Our staff will call the number listed on your records 48-72 hours following your procedure to check on you and address any questions or concerns that you may have regarding the information given to you following your procedure. If we do not reach you, we will leave a message.  We will attempt to reach you two times.  During this call, we will ask if you have developed any symptoms of COVID 19. If you develop any symptoms (ie: fever, flu-like symptoms, shortness of breath, cough etc.) before then, please call [phone].  If you test positive for Covid 19 in the 2 weeks post procedure, please call and report this information to [place].    If any biopsies were taken you will be contacted by phone or by letter within the next 1-3 weeks.  Please call us at [phone] if you have not heard about the biopsies in 3 weeks.    SIGNATURES/CONFIDENTIALITY: You and/or your care partner have signed paperwork which will be entered into your electronic medical record.  These signatures attest to the fact that that the information above on your After Visit Summary has been reviewed and is understood.  Full responsibility of the confidentiality of this discharge information lies with you and/or your care-partner.

## 2019-07-12 NOTE — Progress Notes (Signed)
PT taken to PACU. Monitors in place. VSS. Report given to RN. 

## 2019-07-12 NOTE — Progress Notes (Signed)
Called to room to assist during endoscopic procedure.  Patient ID and intended procedure confirmed with present staff. Received instructions for my participation in the procedure from the performing physician.  

## 2019-07-12 NOTE — Op Note (Signed)
[facility] Patient Name: [name] [name] Procedure Date: 07/12/2019 2:10 PM MRN: [id number] Endoscopist: Remo Lipps P. Havery Moros , MD Age: 69 Referring MD:  Date of Birth: Sep 26, 1950 Gender: Female Account #: [id number] Procedure:                Upper GI endoscopy Indications:              Dysphagia, Follow-up of gastro-esophageal reflux                            disease, nausea Medicines:                Monitored Anesthesia Care Procedure:                Pre-Anesthesia Assessment:                           - Prior to the procedure, a History and Physical                            was performed, and patient medications and                            allergies were reviewed. The patient's tolerance of                            previous anesthesia was also reviewed. The risks                            and benefits of the procedure and the sedation                            options and risks were discussed with the patient.                            All questions were answered, and informed consent                            was obtained. Prior Anticoagulants: The patient has                            taken no previous anticoagulant or antiplatelet                            agents. ASA Grade Assessment: III - A patient with                            severe systemic disease. After reviewing the risks                            and benefits, the patient was deemed in                            satisfactory condition to undergo the procedure.  After obtaining informed consent, the endoscope was                            passed under direct vision. Throughout the                            procedure, the patient's blood pressure, pulse, and                            oxygen saturations were monitored continuously. The                            Endoscope was introduced through the mouth, and                            advanced to the third part of  duodenum. The upper                            GI endoscopy was accomplished without difficulty.                            The patient tolerated the procedure well. Scope In: Scope Out: Findings:                 Esophagogastric landmarks were identified: the                            Z-line was found at 37 cm, the gastroesophageal                            junction was found at 37 cm and the upper extent of                            the gastric folds was found at 39 cm from the                            incisors.                           A 2 cm hiatal hernia was present.                           Diffuse, white plaques were found in the entire                            esophagus consistent with esophageal candidiasis.                           The exam of the esophagus was otherwise normal.                           A guidewire was placed and the scope was withdrawn.  Empiric dilation was performed in the entire                            esophagus with a Savary dilator with mild                            resistance at 17 mm and 18 mm. Relook endoscopy                            showed an appropriate small mucosal wrent just                            inferior to the UES, suspect subtle stricture in                            that area.                           Patchy mildly erythematous mucosa was found in the                            gastric body.                           The exam of the stomach was otherwise normal.                           Biopsies were taken with a cold forceps in the                            gastric body, at the incisura and in the gastric                            antrum for Helicobacter pylori testing.                           A single 3 to 4 mm sessile polyp was found in the                            third portion of the duodenum. The polyp was                            removed with a cold biopsy forceps. Resection  and                            retrieval were complete.                           The exam of the duodenum was otherwise normal. Complications:            No immediate complications. Estimated blood loss:                            Minimal. Estimated Blood Loss:  Estimated blood loss was minimal. Impression:               - Esophagogastric landmarks identified.                           - 2 cm hiatal hernia.                           - Esophageal plaques were found, consistent with                            candidiasis.                           - Normal esophagus otherwise - empiric dilation                            performed to 33mm with small mucosal wrent inferior                            to the UES, suspect subtle stricture in that area.                           - Erythematous mucosa in the gastric body.                           - Normal stomach otherwise - biopsies obtained to                            rule out H pylori.                           - A single duodenal polyp. Resected and retrieved.                           - Normal rest of examined duodenum. Recommendation:           - Patient has a contact number available for                            emergencies. The signs and symptoms of potential                            delayed complications were discussed with the                            patient. Return to normal activities tomorrow.                            Written discharge instructions were provided to the                            patient.                           - Resume previous diet.                           -  Continue present medications.                           - If no medication interactions /                            contraindications, start fluconazole 400mg  x 1                            dose, then 200mg  / day for another 13 days to treat                            esophageal candidiasis                           - Await pathology  results and course post dilation Deepa Barthel P. Havery Moros, MD 07/12/2019 2:50:02 PM This report has been signed electronically.

## 2019-07-14 ENCOUNTER — Ambulatory Visit: Payer: Medicare HMO

## 2019-07-14 ENCOUNTER — Telehealth: Payer: Self-pay

## 2019-07-14 ENCOUNTER — Other Ambulatory Visit: Payer: Self-pay

## 2019-07-14 ENCOUNTER — Ambulatory Visit
Admission: RE | Admit: 2019-07-14 | Discharge: 2019-07-14 | Disposition: A | Discharge status: Home / Self Care | Payer: Medicare HMO | Source: Ambulatory Visit | Attending: Family Medicine | Admitting: Family Medicine

## 2019-07-14 ENCOUNTER — Telehealth: Payer: Self-pay | Admitting: *Deleted

## 2019-07-14 DIAGNOSIS — L299 Pruritus, unspecified: Secondary | ICD-10-CM

## 2019-07-14 DIAGNOSIS — N644 Mastodynia: Secondary | ICD-10-CM

## 2019-07-14 NOTE — Telephone Encounter (Signed)
Attempted to reach patient for post-procedure f/u call. No answer. Left message that we will make another attempt to reach her later today and for her to please not hesitate to call us if she has any questions/concerns regarding her care. 

## 2019-07-14 NOTE — Telephone Encounter (Signed)
No answer for post procedure call back. Left message for patient to call with questions or concerns. 

## 2019-07-19 ENCOUNTER — Encounter: Payer: Self-pay | Admitting: Gastroenterology

## 2019-07-29 ENCOUNTER — Encounter: Payer: Medicare HMO | Attending: Physical Medicine and Rehabilitation | Admitting: Physical Medicine and Rehabilitation

## 2019-07-29 ENCOUNTER — Encounter: Payer: Self-pay | Admitting: Physical Medicine and Rehabilitation

## 2019-07-29 ENCOUNTER — Encounter: Payer: Medicare HMO | Admitting: Physical Medicine and Rehabilitation

## 2019-07-29 ENCOUNTER — Other Ambulatory Visit: Payer: Self-pay

## 2019-07-29 VITALS — BP 173/80 | HR 84 | Temp 97.5°F | Ht 63.0 in | Wt 206.0 lb

## 2019-07-29 DIAGNOSIS — G894 Chronic pain syndrome: Secondary | ICD-10-CM | POA: Insufficient documentation

## 2019-07-29 DIAGNOSIS — M546 Pain in thoracic spine: Secondary | ICD-10-CM | POA: Insufficient documentation

## 2019-07-29 DIAGNOSIS — G8929 Other chronic pain: Secondary | ICD-10-CM | POA: Insufficient documentation

## 2019-07-29 DIAGNOSIS — M797 Fibromyalgia: Secondary | ICD-10-CM | POA: Diagnosis not present

## 2019-07-29 DIAGNOSIS — M7918 Myalgia, other site: Secondary | ICD-10-CM | POA: Insufficient documentation

## 2019-07-29 MED ORDER — METHOCARBAMOL 500 MG PO TABS: 500.0000 mg | ORAL_TABLET | Freq: Three times a day (TID) | ORAL | 1 refills | Status: DC | PRN

## 2019-07-29 NOTE — Patient Instructions (Signed)
Patient is a 70 yr old female with hx of GI bleed, HTN, DM-A1c 8.2; bipolar disorder, depression/anxiety,  Hx of possible CVA as well- - feels weak on L side from that- Here for evaluation.  Dx of fibromyalga and myofascial pain syndrome.   1. Magnesium- 400 mg - 2-3 pills- spaced over the day. Only side effect is a little bit looser stools.    2. Robaxin/Metocarbemol- 500 mg 3x/day AS NEEDED for pain/muscle tightness- don't take every day if possible because gets your body gets used to the medicine. If makes sleepy, let me know, will do skelaxin.    3. Will send in prescription- opiate contract- and drug screen for high dose tramadol once drug screen back- will do 100 mg 3x/day as needed- of note, tramadol can cause constipation.  4. Will schedule for trigger point injections- in next 1-2 weeks.  Is nervous/anxious about injections.    5. Tennis balls- 2-4 minutes- - hold firm pressure- not massage over each spot in back that's painful.  1 hour/day- max   6. F/U in 1 week for trigger point injections and also in 6 weeks Wait on nerve pain meds
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
## 2019-08-05 ENCOUNTER — Ambulatory Visit: Payer: Medicare HMO | Admitting: Physical Medicine and Rehabilitation

## 2019-08-08 ENCOUNTER — Telehealth: Payer: Self-pay | Admitting: *Deleted

## 2019-08-08 ENCOUNTER — Ambulatory Visit (AMBULATORY_SURGERY_CENTER): Payer: Self-pay | Admitting: *Deleted

## 2019-08-08 ENCOUNTER — Other Ambulatory Visit: Payer: Self-pay

## 2019-08-08 VITALS — Temp 96.0°F | Ht 63.0 in | Wt 208.0 lb

## 2019-08-08 DIAGNOSIS — R194 Change in bowel habit: Secondary | ICD-10-CM

## 2019-08-08 DIAGNOSIS — Z800 Family history of malignant neoplasm of digestive organs: Secondary | ICD-10-CM

## 2019-08-08 DIAGNOSIS — Z86010 Personal history of colonic polyps: Secondary | ICD-10-CM

## 2019-08-08 MED ORDER — PEG 3350-KCL-NA BICARB-NACL 420 G PO SOLR: 4000.0000 mL | Freq: Once | ORAL | 0 refills | Status: AC

## 2019-08-08 MED ORDER — ONDANSETRON HCL 4 MG PO TABS: 4.0000 mg | ORAL_TABLET | Freq: Four times a day (QID) | ORAL | 1 refills | Status: DC | PRN

## 2019-08-08 NOTE — Progress Notes (Signed)
07-05-2019 comp covid vaccines   Niece wanda in [place] with pt- temp 96.9   No egg or soy allergy known to patient  No issues with past sedation with any surgeries  or procedures, no intubation problems  No diet pills per patient No home 02 use per patient  No blood thinners per patient  Pt denies issues with constipation - 2 day Suprep per MD - she states she has yellow stools 2-3 times a day soft pasty like baby poop  - occ black stools- no blood noted- having abd pain and nausea- TE to SA- ? OV or proceed with Colon...  No A fib or A flutter  EMMI video sent to pt's e mail   Due to the COVID-19 pandemic we are asking patients to follow these guidelines. Please only bring one care partner. Please be aware that your care partner may wait in the car in the parking lot or if they feel like they will be too hot to wait in the car, they may wait in the lobby on the 4th floor. All care partners are required to wear a mask the entire time (we do not have any that we can provide them), they need to practice social distancing, and we will do a Covid check for all patient's and care partners when you arrive. Also we will check their temperature and your temperature. If the care partner waits in their car they need to stay in the parking lot the entire time and we will call them on their cell phone when the patient is ready for discharge so they can bring the car to the front of the building. Also all patient's will need to wear a mask into building.

## 2019-08-08 NOTE — Telephone Encounter (Signed)
Dr Havery Moros-  I saw this pt in [place] today- she saw you in the office 06-22-2019- she had an EGD 07-12-2019- she tells me she has finished her script of fluconazole 200 mg tablets - she satates on this medicine she had no s/s or less than now- - she said she is having pain in her upper and lower abdomen and her left side is worse than the right side- she has nausea- she has constant nausea the last 2-3 days and when she tries to eat , she feels like she has to vomit- You dilated her esophagus at her 3-23-egd and she states she has some issues swallowing and she has mucus drainage all the time-  She is asking me for pain medicine and anti-nausea medicine- she is scheduled for a colonoscopy 4-26 Monday for  Hx colon polyps and change in bowel habits- she said she needs something -  does she need another OV or what to do for her??   Please advise -- Thanks, Lelan Pons [place]

## 2019-08-08 NOTE — Telephone Encounter (Signed)
Sent in script for zofran as per SA- Pt has no ride for the PA OV Thursday 4-22- she will proceed with Zofran and colon 4-26 as scheduled  Pt instructed to call with further questions or issues

## 2019-08-08 NOTE — Telephone Encounter (Signed)
Thanks for the update. Okay to give her zofran 4mg  ODT - 1 tab every 6 hours PRN nausea #30 RF1. She needs to follow up for clinic evaluation for her pain, can see an APP if they have any openings soon.  Would proceed with colonoscopy as scheduled if she can tolerate the prep otherwise. Thanks

## 2019-08-11 ENCOUNTER — Encounter: Payer: Medicare HMO | Admitting: Gastroenterology

## 2019-08-15 ENCOUNTER — Ambulatory Visit (AMBULATORY_SURGERY_CENTER): Payer: Medicare HMO | Admitting: Gastroenterology

## 2019-08-15 ENCOUNTER — Other Ambulatory Visit: Payer: Self-pay

## 2019-08-15 ENCOUNTER — Encounter: Payer: Self-pay | Admitting: Gastroenterology

## 2019-08-15 VITALS — BP 181/89 | HR 85 | Temp 97.1°F | Resp 19 | Ht 63.0 in | Wt 208.0 lb

## 2019-08-15 DIAGNOSIS — Z86010 Personal history of colonic polyps: Secondary | ICD-10-CM

## 2019-08-15 DIAGNOSIS — D125 Benign neoplasm of sigmoid colon: Secondary | ICD-10-CM

## 2019-08-15 DIAGNOSIS — D122 Benign neoplasm of ascending colon: Secondary | ICD-10-CM

## 2019-08-15 MED ORDER — SODIUM CHLORIDE 0.9 % IV SOLN: 500.0000 mL | Freq: Once | INTRAVENOUS | Status: DC

## 2019-08-15 NOTE — Patient Instructions (Signed)
3 polyps found, removed and sent to pathology.  Internal hemorrhoids.  Await pathology for final recommendations.  Handouts on findings given to patient.    YOU HAD AN ENDOSCOPIC PROCEDURE TODAY AT [facility]:   Refer to the procedure report that was given to you for any specific questions about what was found during the examination.  If the procedure report does not answer your questions, please call your gastroenterologist to clarify.  If you requested that your care partner not be given the details of your procedure findings, then the procedure report has been included in a sealed envelope for you to review at your convenience later.  YOU SHOULD EXPECT: Some feelings of bloating in the abdomen. Passage of more gas than usual.  Walking can help get rid of the air that was put into your GI tract during the procedure and reduce the bloating. If you had a lower endoscopy (such as a colonoscopy or flexible sigmoidoscopy) you may notice spotting of blood in your stool or on the toilet paper. If you underwent a bowel prep for your procedure, you may not have a normal bowel movement for a few days.  Please Note:  You might notice some irritation and congestion in your nose or some drainage.  This is from the oxygen used during your procedure.  There is no need for concern and it should clear up in a day or so.  SYMPTOMS TO REPORT IMMEDIATELY:   Following lower endoscopy (colonoscopy or flexible sigmoidoscopy):  Excessive amounts of blood in the stool  Significant tenderness or worsening of abdominal pains  Swelling of the abdomen that is new, acute  Fever of 100F or higher   For urgent or emergent issues, a gastroenterologist can be reached at any hour by calling [phone]. Do not use MyChart messaging for urgent concerns.    DIET:  We do recommend a small meal at first, but then you may proceed to your regular diet.  Drink plenty of fluids but you should avoid alcoholic  beverages for 24 hours.  ACTIVITY:  You should plan to take it easy for the rest of today and you should NOT DRIVE or use heavy machinery until tomorrow (because of the sedation medicines used during the test).    FOLLOW UP: Our staff will call the number listed on your records 48-72 hours following your procedure to check on you and address any questions or concerns that you may have regarding the information given to you following your procedure. If we do not reach you, we will leave a message.  We will attempt to reach you two times.  During this call, we will ask if you have developed any symptoms of COVID 19. If you develop any symptoms (ie: fever, flu-like symptoms, shortness of breath, cough etc.) before then, please call [phone].  If you test positive for Covid 19 in the 2 weeks post procedure, please call and report this information to [place].    If any biopsies were taken you will be contacted by phone or by letter within the next 1-3 weeks.  Please call us at [phone] if you have not heard about the biopsies in 3 weeks.    SIGNATURES/CONFIDENTIALITY: You and/or your care partner have signed paperwork which will be entered into your electronic medical record.  These signatures attest to the fact that that the information above on your After Visit Summary has been reviewed and is understood.  Full responsibility of the confidentiality of  this discharge information lies with you and/or your care-partner.

## 2019-08-15 NOTE — Op Note (Addendum)
[facility] Patient Name: [name] [name] Procedure Date: 08/15/2019 3:34 PM MRN: [id number] Endoscopist: Remo Lipps P. Havery Moros , MD Age: 69 Referring MD:  Date of Birth: 1950/12/14 Gender: Female Account #: [id number] Procedure:                Colonoscopy Indications:              High risk colon cancer surveillance: Personal                            history of colonic polyps 2013 - adenoma removed -                            fair prep Medicines:                Monitored Anesthesia Care Procedure:                Pre-Anesthesia Assessment:                           - Prior to the procedure, a History and Physical                            was performed, and patient medications and                            allergies were reviewed. The patient's tolerance of                            previous anesthesia was also reviewed. The risks                            and benefits of the procedure and the sedation                            options and risks were discussed with the patient.                            All questions were answered, and informed consent                            was obtained. Prior Anticoagulants: The patient has                            taken no previous anticoagulant or antiplatelet                            agents. ASA Grade Assessment: III - A patient with                            severe systemic disease. After reviewing the risks                            and benefits, the patient was deemed in  satisfactory condition to undergo the procedure.                           After obtaining informed consent, the colonoscope                            was passed under direct vision. Throughout the                            procedure, the patient's blood pressure, pulse, and                            oxygen saturations were monitored continuously. The                            Colonoscope was introduced through the  anus and                            advanced to the the cecum, identified by                            appendiceal orifice and ileocecal valve. The                            colonoscopy was performed without difficulty. The                            patient tolerated the procedure well. The quality                            of the bowel preparation was good. The ileocecal                            valve, appendiceal orifice, and rectum were                            photographed. Scope In: 3:47:18 PM Scope Out: 4:21:06 PM Scope Withdrawal Time: 0 hours 20 minutes 36 seconds  Total Procedure Duration: 0 hours 33 minutes 48 seconds  Findings:                 The perianal and digital rectal examinations were                            normal.                           A 3 mm polyp was found in the proximal ascending                            colon. The polyp was sessile. The polyp was removed                            with a cold snare. Resection and retrieval were  complete.                           A 10 mm polyp was found in the ascending colon. The                            polyp was sessile. The polyp was removed with a                            cold snare. Resection and retrieval were complete.                           A 5-6 mm polyp was found in the sigmoid colon. The                            polyp was pedunculated. The polyp was removed with                            a hot snare. Resection and retrieval were complete.                           The colon was tortuous with looping in the right                            colon, technically challenging to see back portion                            of cecum behind the valve which was folded back on                            itself. Multiple passes made, no obvious lesions                            seen.                           Internal hemorrhoids were found during retroflexion.                            A few small benign appearing flat hyperplastic                            polyps were noted in the rectum / sigmoid colon.                            The exam was otherwise without abnormality. Complications:            No immediate complications. Estimated blood loss:                            Minimal. Estimated Blood Loss:     Estimated blood loss was minimal. Impression:               - One 3 mm polyp in the proximal ascending  colon,                            removed with a cold snare. Resected and retrieved.                           - One 10 mm polyp in the ascending colon, removed                            with a cold snare. Resected and retrieved.                           - One 5-6 mm polyp in the sigmoid colon, removed                            with a hot snare. Resected and retrieved.                           - Tortuous colon with looping as outlined -                            difficult to see back portion of cecum behind valve.                           - Internal hemorrhoids.                           - A few benign flat left sided hyperplastic polyps.                           - The examination was otherwise normal. Recommendation:           - Patient has a contact number available for                            emergencies. The signs and symptoms of potential                            delayed complications were discussed with the                            patient. Return to normal activities tomorrow.                            Written discharge instructions were provided to the                            patient.                           - Resume previous diet.                           - Continue present medications.                           -  Await pathology results. Remo Lipps P. Havery Moros, MD 08/15/2019 4:28:41 PM This report has been signed electronically.

## 2019-08-15 NOTE — Progress Notes (Signed)
Temp by LC Vitals by KA    Pt's states no medical or surgical changes since previsit or office visit.

## 2019-08-15 NOTE — Progress Notes (Signed)
Called to room to assist during endoscopic procedure.  Patient ID and intended procedure confirmed with present staff. Received instructions for my participation in the procedure from the performing physician.  

## 2019-08-15 NOTE — Progress Notes (Signed)
A/ox3, pleased with MAC, report to RN 

## 2019-08-17 ENCOUNTER — Telehealth: Payer: Self-pay | Admitting: *Deleted

## 2019-08-17 ENCOUNTER — Telehealth: Payer: Self-pay

## 2019-08-17 NOTE — Telephone Encounter (Signed)
Message left

## 2019-08-17 NOTE — Telephone Encounter (Signed)
Left message on answering machine. 

## 2019-08-19 ENCOUNTER — Encounter: Payer: Self-pay | Admitting: Gastroenterology

## 2019-08-19 ENCOUNTER — Other Ambulatory Visit: Payer: Self-pay | Admitting: Family Medicine

## 2019-08-19 MED ORDER — ROSUVASTATIN CALCIUM 40 MG PO TABS: 40.0000 mg | ORAL_TABLET | Freq: Every day | ORAL | 3 refills | Status: DC

## 2019-08-19 NOTE — Progress Notes (Signed)
Patient should be on high intensity statin.

## 2019-08-24 ENCOUNTER — Encounter: Payer: Medicare HMO | Admitting: Physical Medicine and Rehabilitation

## 2019-08-24 ENCOUNTER — Other Ambulatory Visit: Payer: Self-pay | Admitting: Family Medicine

## 2019-08-24 DIAGNOSIS — E1142 Type 2 diabetes mellitus with diabetic polyneuropathy: Secondary | ICD-10-CM

## 2019-09-14 ENCOUNTER — Encounter: Payer: Medicare HMO | Admitting: Physical Medicine and Rehabilitation

## 2019-09-20 ENCOUNTER — Other Ambulatory Visit: Payer: Self-pay

## 2019-09-20 MED ORDER — PANTOPRAZOLE SODIUM 40 MG PO TBEC: 40.0000 mg | DELAYED_RELEASE_TABLET | Freq: Every day | ORAL | 1 refills | Status: DC

## 2019-09-20 NOTE — Progress Notes (Signed)
rec'd refill request for pantoprazole 40mg  once daily. Pt last seen 06-2019

## 2019-09-21 ENCOUNTER — Encounter: Payer: Medicare HMO | Admitting: Physical Medicine and Rehabilitation

## 2019-09-22 ENCOUNTER — Ambulatory Visit (HOSPITAL_COMMUNITY)
Admission: EM | Admit: 2019-09-22 | Discharge: 2019-09-22 | Disposition: A | Discharge status: Home / Self Care | Payer: Medicare HMO | Attending: Internal Medicine | Admitting: Internal Medicine

## 2019-09-22 ENCOUNTER — Other Ambulatory Visit: Payer: Self-pay

## 2019-09-22 DIAGNOSIS — J209 Acute bronchitis, unspecified: Secondary | ICD-10-CM | POA: Diagnosis not present

## 2019-09-22 DIAGNOSIS — R059 Cough, unspecified: Secondary | ICD-10-CM

## 2019-09-22 DIAGNOSIS — J0100 Acute maxillary sinusitis, unspecified: Secondary | ICD-10-CM | POA: Diagnosis not present

## 2019-09-22 DIAGNOSIS — R062 Wheezing: Secondary | ICD-10-CM | POA: Diagnosis not present

## 2019-09-22 DIAGNOSIS — R0981 Nasal congestion: Secondary | ICD-10-CM | POA: Diagnosis present

## 2019-09-22 DIAGNOSIS — Z20822 Contact with and (suspected) exposure to covid-19: Secondary | ICD-10-CM | POA: Diagnosis not present

## 2019-09-22 DIAGNOSIS — R05 Cough: Secondary | ICD-10-CM | POA: Diagnosis not present

## 2019-09-22 MED ORDER — AMOXICILLIN 875 MG PO TABS: 875.0000 mg | ORAL_TABLET | Freq: Two times a day (BID) | ORAL | 0 refills | Status: AC

## 2019-09-22 MED ORDER — DEXAMETHASONE SODIUM PHOSPHATE 10 MG/ML IJ SOLN
10.0000 mg | Freq: Once | INTRAMUSCULAR | Status: AC
  Administered 2019-09-22: 10 mg via INTRAMUSCULAR

## 2019-09-22 MED ORDER — PREDNISONE 10 MG (21) PO TBPK: ORAL_TABLET | Freq: Every day | ORAL | 0 refills | Status: AC

## 2019-09-22 MED ORDER — BENZONATATE 100 MG PO CAPS: 100.0000 mg | ORAL_CAPSULE | Freq: Three times a day (TID) | ORAL | 0 refills | Status: DC

## 2019-09-22 MED ORDER — DEXAMETHASONE SODIUM PHOSPHATE 10 MG/ML IJ SOLN
INTRAMUSCULAR | Status: AC
  Filled 2019-09-22: qty 1

## 2019-09-22 NOTE — Discharge Instructions (Addendum)
You have a sinus infection. You also have bronchitis.  I have prescribed amoxicillin 875mg  twice a day for 10 days.  I have prescribed tessalon perles for your cough.  I have prescribed a steroid taper for you to take. Take this as directed on the label.  Follow up with this office or with primary care if you are not feeling better over the next 2 days.  Follow up with the ER for trouble swallowing, trouble breathing, other concerning symptoms.
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
## 2019-09-22 NOTE — ED Triage Notes (Signed)
Pt c/o cough, chest pain with cough, shortness of breath and runny nose x 2 weeks, OTC meds not helping

## 2019-09-23 LAB — SARS CORONAVIRUS 2 (TAT 6-24 HRS): SARS Coronavirus 2: NEGATIVE

## 2019-09-28 ENCOUNTER — Other Ambulatory Visit: Payer: Self-pay | Admitting: Family Medicine

## 2019-09-28 NOTE — Telephone Encounter (Signed)
Per Dr. Arlana Pouch note from 04/2019, he agreed to give patient 1 more refill but then she would need to f/u with psychiatry for further treatment

## 2019-10-10 ENCOUNTER — Encounter: Payer: Medicare HMO | Admitting: Physical Medicine and Rehabilitation

## 2019-10-11 ENCOUNTER — Other Ambulatory Visit: Payer: Self-pay

## 2019-10-11 ENCOUNTER — Ambulatory Visit (INDEPENDENT_AMBULATORY_CARE_PROVIDER_SITE_OTHER): Payer: Medicare HMO | Admitting: Family Medicine

## 2019-10-11 DIAGNOSIS — J189 Pneumonia, unspecified organism: Secondary | ICD-10-CM

## 2019-10-11 DIAGNOSIS — B372 Candidiasis of skin and nail: Secondary | ICD-10-CM

## 2019-10-11 MED ORDER — NYSTATIN 100000 UNIT/ML MT SUSP: 200000.0000 [IU] | Freq: Four times a day (QID) | OROMUCOSAL | 1 refills | Status: DC

## 2019-10-11 MED ORDER — GUAIFENESIN-CODEINE 100-10 MG/5ML PO SOLN: 5.0000 mL | Freq: Three times a day (TID) | ORAL | 0 refills | Status: DC | PRN

## 2019-10-11 MED ORDER — AZITHROMYCIN 250 MG PO TABS: ORAL_TABLET | ORAL | 0 refills | Status: DC

## 2019-10-11 NOTE — Patient Instructions (Signed)
It was great seeing you today!  I think your symptoms do not look consistent with so-called walking pneumonia.  This is usually caused by an atypical bacteria that the amoxicillin would not of treated.  Treatment for this is a Z-Pak.  The first 2 days take 2 tablets (500 mg total), and on each subsequent day take 1 tablet until you run out.  For your bad cough I sent in a prescription strength cough medication, this should help.  I also advised taking a teaspoon of honey which can sometimes help with cough as well.  You can use this frequently as there are no side effects.  On top of that I did refill your nystatin swish and swallow.  Please let me know if you are still having symptoms, you can give me a phone call and we can reevaluate.

## 2019-10-12 ENCOUNTER — Encounter: Payer: Self-pay | Admitting: Family Medicine

## 2019-10-12 DIAGNOSIS — J189 Pneumonia, unspecified organism: Secondary | ICD-10-CM | POA: Insufficient documentation

## 2019-10-12 NOTE — Assessment & Plan Note (Signed)
Leading differential for patient's symptoms is atypical pneumonia.  Given the long time course of her symptoms, relative lack of positive exam findings, and lack of improvement with amoxicillin and steroids more common problems have been essentially ruled out.  Given her extreme cough I will give her a short course of guaifenesin/codeine cough syrup.  We will give a 5-day course of azithromycin, 500 g day 1, 250 mg subsequent days.  Instructed patient to please let me know how she is doing near the end of her azithromycin course.  She is still not improved, could likely benefit from imaging such as a chest x-ray.

## 2019-10-12 NOTE — Progress Notes (Signed)
   CHIEF COMPLAINT / HPI: 69 year old female who presents with persistent cough, fatigue, sinus pressure.  Patient was first evaluated for this problem 09/22/2019.  She had a negative Covid test.  She was diagnosed with acute bronchitis intermixed with sinusitis and was given Decadron 10 mg, Tessalon Perles, prednisone taper, amoxicillin.  Unfortunately the patient states that none of these medications have helped manage her symptoms.  She still has a profound cough, especially at night.  States that she has trouble laying flat at night due to the increased coughing.  She has noticed a lot of fatigue recently.  She has not noticed any increased swelling in her legs.  Of note the patient received both doses of the Pfizer Covid vaccine.  She has had no known sick contacts recently.  PERTINENT  PMH / PSH:    OBJECTIVE: BP (!) 160/80   Pulse 89   Temp (!) 93.6 F (34.2 C)   SpO2 90%   Gen: 69 year old African-American female, no acute distress HEENT: No posterior pharyngeal erythema noted, no cervical lymphadenopathy CV: Regular rate and rhythm, no M/R/G Resp: Lungs clear to auscultation bilaterally, no accessory muscle use, no crackles or signs of edema noted Extremities: No pitting edema, no increased JVP Neuro: Alert and oriented, Speech clear, No gross deficits   ASSESSMENT / PLAN:  Atypical pneumonia Leading differential for patient's symptoms is atypical pneumonia.  Given the long time course of her symptoms, relative lack of positive exam findings, and lack of improvement with amoxicillin and steroids more common problems have been essentially ruled out.  Given her extreme cough I will give her a short course of guaifenesin/codeine cough syrup.  We will give a 5-day course of azithromycin, 500 g day 1, 250 mg subsequent days.  Instructed patient to please let me know how she is doing near the end of her azithromycin course.  She is still not improved, could likely benefit from imaging such  as a chest x-ray.     Guadalupe Dawn MD PGY-3 Family Medicine Resident [place]

## 2019-10-18 ENCOUNTER — Other Ambulatory Visit: Payer: Self-pay

## 2019-10-18 ENCOUNTER — Ambulatory Visit (INDEPENDENT_AMBULATORY_CARE_PROVIDER_SITE_OTHER): Payer: Medicare HMO | Admitting: Family Medicine

## 2019-10-18 ENCOUNTER — Ambulatory Visit
Admission: RE | Admit: 2019-10-18 | Discharge: 2019-10-18 | Disposition: A | Discharge status: Home / Self Care | Payer: Medicare HMO | Source: Ambulatory Visit | Attending: Family Medicine | Admitting: Family Medicine

## 2019-10-18 ENCOUNTER — Encounter: Payer: Self-pay | Admitting: Family Medicine

## 2019-10-18 VITALS — BP 162/82 | HR 81

## 2019-10-18 DIAGNOSIS — R053 Chronic cough: Secondary | ICD-10-CM

## 2019-10-18 DIAGNOSIS — L02419 Cutaneous abscess of limb, unspecified: Secondary | ICD-10-CM | POA: Diagnosis not present

## 2019-10-18 DIAGNOSIS — R05 Cough: Secondary | ICD-10-CM

## 2019-10-18 DIAGNOSIS — J320 Chronic maxillary sinusitis: Secondary | ICD-10-CM | POA: Diagnosis not present

## 2019-10-18 MED ORDER — CLINDAMYCIN HCL 300 MG PO CAPS: 300.0000 mg | ORAL_CAPSULE | Freq: Three times a day (TID) | ORAL | 0 refills | Status: DC

## 2019-10-18 MED ORDER — GUAIFENESIN-CODEINE 100-10 MG/5ML PO SOLN: 5.0000 mL | Freq: Three times a day (TID) | ORAL | 0 refills | Status: DC | PRN

## 2019-10-18 NOTE — Progress Notes (Signed)
   CHIEF COMPLAINT / HPI: 69 year old female who presents for follow-up.  Patient previously seen in clinic by me on 10/11/2019.  She was diagnosed with atypical pneumonia and treated with a course of azithromycin and guaifenesin/codeine cough syrup.  Patient states that her symptoms have not improved subjectively.  Her symptoms have not changed since that clinic visit.  Her symptoms are mainly profound cough especially at night.  Of note the patient has had a "boil" developing on her left armpit for the last couple of days.  She states that she has noticed that she has felt warmer since this started.  It has spread and become much more painful in the meantime.  She states that she has had a history of boils but these have been in her rectal area.  One is required surgical drainage.  This is the first abscess that she is noted under her armpit.  PERTINENT  PMH / PSH:    OBJECTIVE: BP (!) 162/82   Pulse 81   SpO2 53%   Gen: 69 year old African-American female, no acute distress, resting comfortably, dry hacking cough noted but has much improved from last visit Resp: Lungs clear to auscultation bilaterally, no accessory muscle use Neuro: Alert and oriented, Speech clear, No gross deficits  Left axilla: Large, circular, raised area of induration with white central clearing.  Already leaking small amount of white purulent material with only very minor palpation of gloved finger.  ASSESSMENT / PLAN:  Axillary abscess Patient with left axillary abscess as noted on exam.  I&D performed as per attached procedure note.  Will treat with clindamycin 200 mg 3 times daily for 5 days.  Clindamycin chosen as to cover for possible upper respiratory flora for potential sinus infection.  Sinusitis, chronic Patient with likely chronic sinusitis contributing to her sinus symptoms and chronic cough.  Refilled patient's guaifenesin/codeine for additional 1 week.  We will get upper respiratory flora coverage with  the clindamycin prescribed for left axillary abscess.  Given the duration of her symptoms, her smoking history, and other factors will get 2 view chest x-ray to better evaluate.    Guadalupe Dawn, MD [place]

## 2019-10-18 NOTE — Assessment & Plan Note (Signed)
Patient with left axillary abscess as noted on exam.  I&D performed as per attached procedure note.  Will treat with clindamycin 200 mg 3 times daily for 5 days.  Clindamycin chosen as to cover for possible upper respiratory flora for potential sinus infection.

## 2019-10-18 NOTE — Assessment & Plan Note (Signed)
Patient with likely chronic sinusitis contributing to her sinus symptoms and chronic cough.  Refilled patient's guaifenesin/codeine for additional 1 week.  We will get upper respiratory flora coverage with the clindamycin prescribed for left axillary abscess.  Given the duration of her symptoms, her smoking history, and other factors will get 2 view chest x-ray to better evaluate.

## 2019-10-18 NOTE — Progress Notes (Signed)
Family medicine procedure note  Procedure: Left axillary abscess incision and drainage Preprocedure diagnosis: Left axillary abscess Postprocedure diagnosis: Left axillary abscess Performing physician: Dr. Guadalupe Dawn Supervising physician: Dr. Chrisandra Netters Anesthetic: 4 mL 1% lidocaine with epinephrine  Procedure: After the risk benefits and alternatives of left axillary abscess I&D were explained, written consent obtained and placed in the chart.  A large amount of white purulent material was expressed by digital manipulation of the patient's large axillary abscess.  Area was then disinfected with iodine swab x1, alcohol swab x2.  4 mL of 1% lidocaine with epinephrine was injected to the area.  Once anesthesia had been satisfactorily obtained attention was then placed to the abscess.  Using 11 blade scalpel to small puncture wounds were placed in center.  A moderate amount of purulent white material was then digitally expressed.  After expression was complete, hemostasis was achieved by holding pressure for 5 minutes.  A bandage consisting of a 4 x 4 with a wrap with Kerlix was achieved.  Patient was sent home with a 5-day prescription of clindamycin.  Guadalupe Dawn MD PGY-3 Family Medicine Resident

## 2019-10-18 NOTE — Patient Instructions (Signed)
It was great seeing you today!  I am sorry about your boil under your arm.  We did drain this and get a large amount of pus out.  To cover for these bacteria as well as bacteria that may be causing your sinusitis I will put you on clindamycin 300 mg 3 times a day for 5 days.  On top of that I think I would like to get a chest x-ray to better evaluate was going on with your cough.  You just walk-in to [facility] imaging to get this testing done.

## 2019-10-19 ENCOUNTER — Telehealth: Payer: Self-pay | Admitting: Family Medicine

## 2019-10-19 NOTE — Telephone Encounter (Signed)
Called pt back She was started on clindamycin and that should cover any lung infection (+ cxr). Discussed with her. If she gets worse she will call, if not better in 5 days, she will call for appt. Dorcas Mcmurray

## 2019-10-19 NOTE — Telephone Encounter (Signed)
Called patient to try and inform her of results of right middle lobe density likely consistent with community-acquired pneumonia.  Is covered by her clindamycin to 300 mg 3 times daily monotherapy which she is also getting for her sinus symptoms and her left axillary abscess for MRSA coverage.  We will continue to try and reach patient.  Left instructions to call back, could not confirm it was a secure voicemail.  Guadalupe Dawn MD PGY-3 Family Medicine Resident

## 2019-10-19 NOTE — Telephone Encounter (Signed)
Patient returning Dr. Dorise Bullion call. Please call patient back when you get a chance.

## 2019-11-02 ENCOUNTER — Ambulatory Visit (INDEPENDENT_AMBULATORY_CARE_PROVIDER_SITE_OTHER): Payer: Medicare HMO | Admitting: Family Medicine

## 2019-11-02 ENCOUNTER — Other Ambulatory Visit: Payer: Self-pay

## 2019-11-02 DIAGNOSIS — R05 Cough: Secondary | ICD-10-CM

## 2019-11-02 DIAGNOSIS — R053 Chronic cough: Secondary | ICD-10-CM

## 2019-11-02 DIAGNOSIS — R0789 Other chest pain: Secondary | ICD-10-CM | POA: Diagnosis not present

## 2019-11-02 MED ORDER — OMEPRAZOLE 40 MG PO CPDR: 40.0000 mg | DELAYED_RELEASE_CAPSULE | Freq: Every day | ORAL | 3 refills | Status: DC

## 2019-11-02 MED ORDER — ALBUTEROL SULFATE (2.5 MG/3ML) 0.083% IN NEBU: 2.5000 mg | INHALATION_SOLUTION | Freq: Four times a day (QID) | RESPIRATORY_TRACT | 3 refills | Status: DC | PRN

## 2019-11-02 MED ORDER — FAMOTIDINE 40 MG PO TABS: 40.0000 mg | ORAL_TABLET | Freq: Every day | ORAL | 0 refills | Status: DC

## 2019-11-02 MED ORDER — BUDESONIDE 0.25 MG/2ML IN SUSP: 0.2500 mg | Freq: Two times a day (BID) | RESPIRATORY_TRACT | 12 refills | Status: DC

## 2019-11-02 NOTE — Patient Instructions (Addendum)
It was nice to meet you today,  We made the following changes today: -I have ordered a chest CT for better visualization of your lungs.  We will schedule appointment for you.  -I would like you to switch from pantoprazole to omeprazole and I am also going to add Pepcid.  You can take the omeprazole in the morning and then the Pepcid at night.  Take both of these once a day each.  -I have ordered you a inhaler that you should use twice a day every day.  I have also refilled your nebulizer solution of albuterol.  I would like you to follow-up with [place] in 2 weeks so we can reevaluate how your symptoms are after this new medication.  Have a great day,  Clemetine Marker, MD

## 2019-11-02 NOTE — Progress Notes (Signed)
    SUBJECTIVE:   CHIEF COMPLAINT / HPI:   Cough: treated for atypical PNA on 6/22 as well as 6/29.  SARS negative on 6/3 when she went to Urgent care for dry cough and nasal congestion. At that visit she was given steroids and amoxicillin. Marland Kitchen  Has both COVID vaccinations. So far she has completed full courses of amoxicillin, azithromycin, and clindamycin at different times with no improvement.  She is also experiencing rhinorrhea.she says her granddaughter checked her temperature last night when the patient states she was having cold sweats and she thinks it was 103? But she can't be sure.  She has develop pain in her ribs bilaterally that worsens with cough.   She drinks brother, water, juice but not much else PO intake.  She has a 'burning' pain in her chest near her upper sternum. She also sates she has had a 'burning' stomach pain for the past two weeks that was worse with eating. She takes 40mg  pantoprazole daily.  She has been diagnosed with asthma in the past and is currently taking albuterol inhalers but no other inhalers.  She ran out of flonase after her last visit. She is not currently taking any antihistamines.  She was using zyrtec but there was an issue with refilling her prescription.     PERTINENT  PMH / PSH: possible asthma? Allergies?   OBJECTIVE:   BP 140/72   Pulse 78   SpO2 98%   Gen: alert.  Appears to be in moderate distress.  Coughing.   HEENT: moist oral mucosa.  No erythema.  CV: RRR. No murmurs.  Pulm: slight crackle over the right lower lung field.  Otherwise clear.  Pt coughing.  Mild-moderate increased work of breathing.  Abd: soft, nontender.  Ext: no LE edema.  Msk: no TTP over the sternum.    ASSESSMENT/PLAN:   Chronic cough This cough has been ongoing for approximately 6 weeks and has been unrelieved by 3 separate courses of antibiotics. CXR performed last visit showed a possible infiltrate in the right middle lobe, but this was questionable.  This patient  will need to get a CT chest for better visualization of this possible infiltrate as well as other possible causes of her chronic cough.  The patient has other issues that appear to be poorly controlled which could each be a cause of chronic cough in and of themselves.  This includes allergies, GERD, and possibly asthma (although pt has had normal PFTs in the past).  We will therefore try to maximize the treatment of these other conditions instead of continuing to treat this as an infectious cause.   - CT chest  - stop pantoprazole and switch to omeprazole and add pepcid.  - refill albuterol neb - start pulmicort neb scheduled - if this does not improve symptoms we can maximize allergy medications on next visit.   - refilled guafenicin-codeine cough syrup to help with rib pain caused by cough     Benay Pike, MD [place]

## 2019-11-03 ENCOUNTER — Telehealth: Payer: Self-pay

## 2019-11-03 NOTE — Telephone Encounter (Signed)
Patient calls nurse line regarding receiving virtussin prescription. Patient was seen in office yesterday with Dr. Jeannine Kitten and states that they discussed using this medication to help with cough. Unable to find this in office visit documentation. Forwarding to Dr. Jeannine Kitten for further clarification.   If appropriate, patient would like to use the [facility] on [address] for this rx.   Talbot Grumbling, RN

## 2019-11-04 ENCOUNTER — Telehealth: Payer: Self-pay | Admitting: Family Medicine

## 2019-11-04 MED ORDER — GUAIFENESIN-CODEINE 100-10 MG/5ML PO SOLN: 5.0000 mL | Freq: Three times a day (TID) | ORAL | 0 refills | Status: DC | PRN

## 2019-11-04 NOTE — Telephone Encounter (Signed)
I have tried 3 times unsuccessfully to reach someone from [place] for approval of this CT scan which they have denied.  Can we please fax over to [facility] my note, Dr. Dorise Bullion note, and the findings of the CXR?   The number I was calling was [phone].  Procedure tracking number was [id number].  Procedure number was [id number].

## 2019-11-04 NOTE — Assessment & Plan Note (Signed)
This cough has been ongoing for approximately 6 weeks and has been unrelieved by 3 separate courses of antibiotics. CXR performed last visit showed a possible infiltrate in the right middle lobe, but this was questionable.  This patient will need to get a CT chest for better visualization of this possible infiltrate as well as other possible causes of her chronic cough.  The patient has other issues that appear to be poorly controlled which could each be a cause of chronic cough in and of themselves.  This includes allergies, GERD, and possibly asthma (although pt has had normal PFTs in the past).  We will therefore try to maximize the treatment of these other conditions instead of continuing to treat this as an infectious cause.   - CT chest  - stop pantoprazole and switch to omeprazole and add pepcid.  - refill albuterol neb - start pulmicort neb scheduled - if this does not improve symptoms we can maximize allergy medications on next visit.   - refilled guafenicin-codeine cough syrup to help with rib pain caused by cough

## 2019-11-04 NOTE — Telephone Encounter (Signed)
Yes, I forgot to send this in during our visit.  I have just sent it in now.

## 2019-11-15 IMAGING — MR MR LUMBAR SPINE W/O CM
4 of 5 series · 26 of 48 positions shown · non-contrast
Comparison: 12/28/2015

CLINICAL DATA: Injured at work 5-10 years ago. Increasing lateral
bowel frequency for the past year. Pain in both extremities.

EXAM:
MRI LUMBAR SPINE WITHOUT CONTRAST
TECHNIQUE: Multiplanar, multisequence MR imaging of the lumbar spine was
performed. No intravenous contrast was administered.

[Series 3: T2 post-contrast · sagittal · 4.0mm · 0.55mm/px · 5 of 13 slices shown]
[im 1/13]
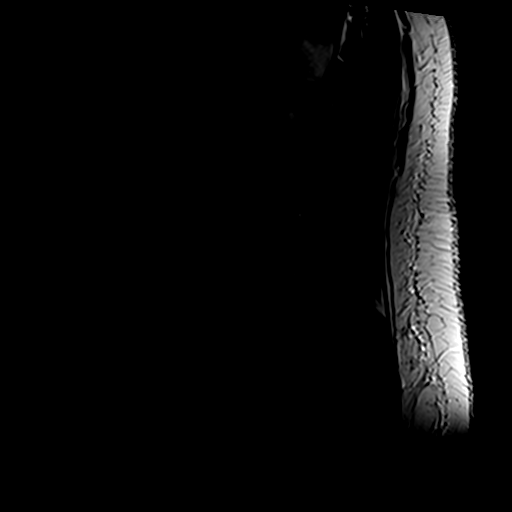
[im 4/13]
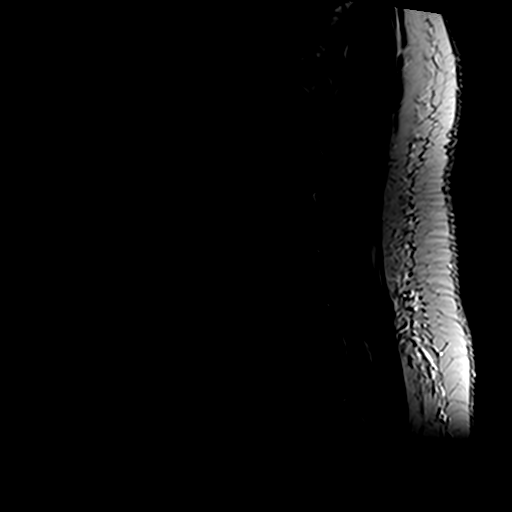
[im 7/13]
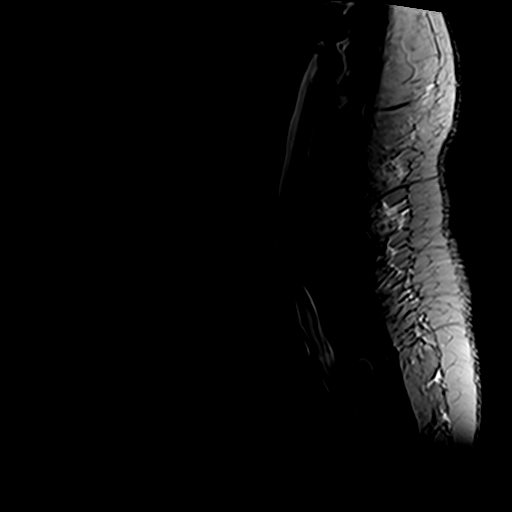
[im 10/13]
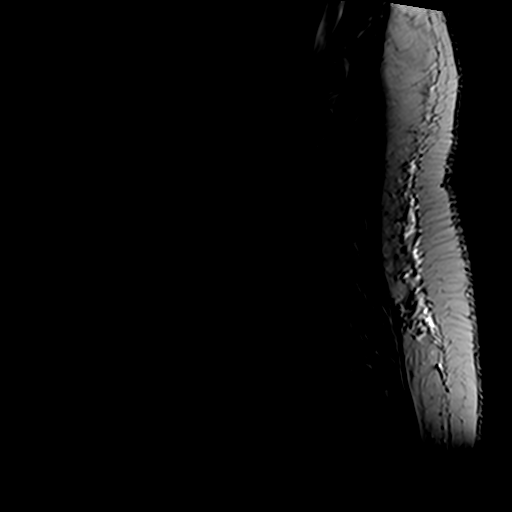
[im 13/13]
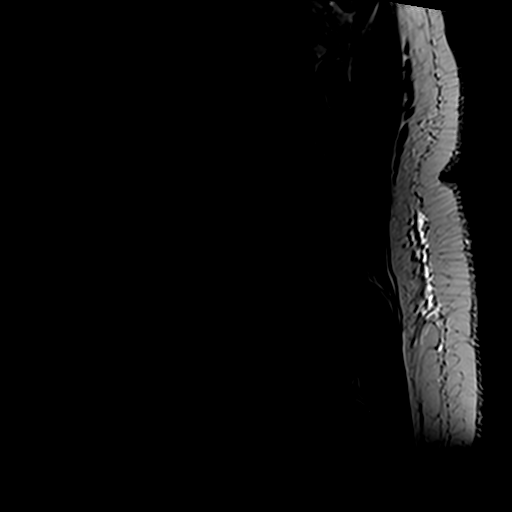

[Series 5: T1 · sagittal · 4.0mm · 0.55mm/px · 6 of 13 slices shown (1 of 2)]
[im 1/13]
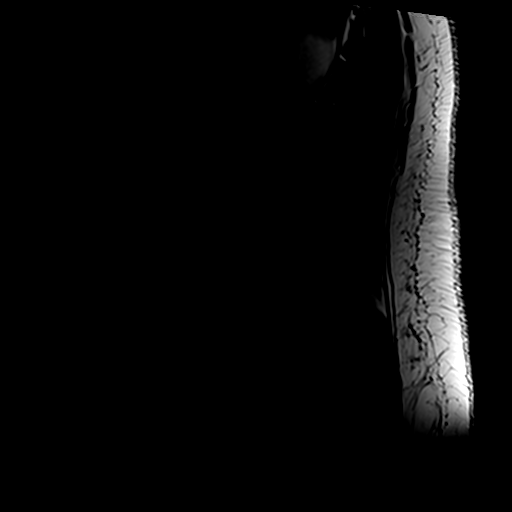
[im 3/13]
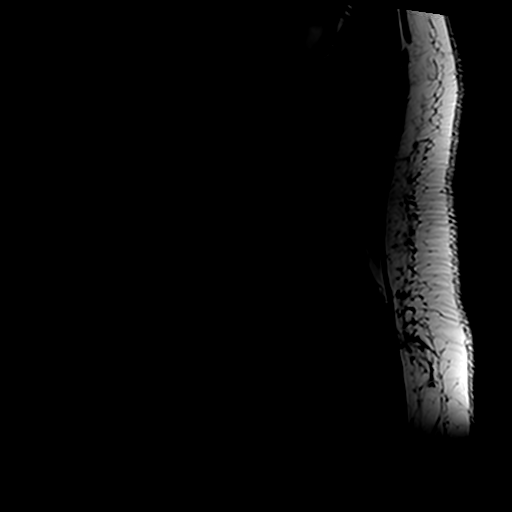
[im 5/13]
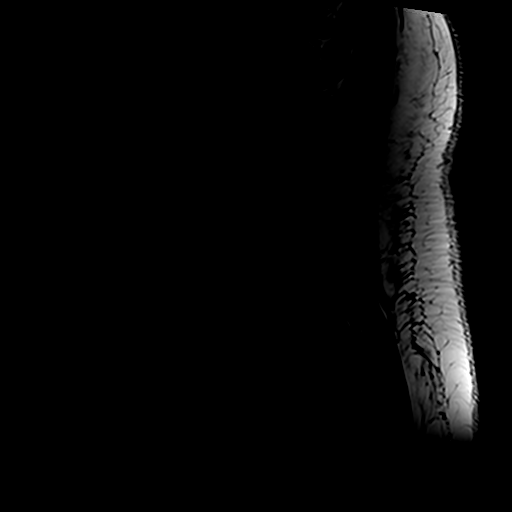
[im 8/13]
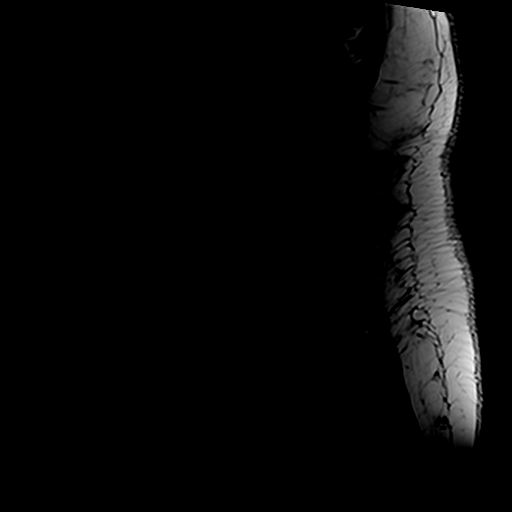
[im 10/13]
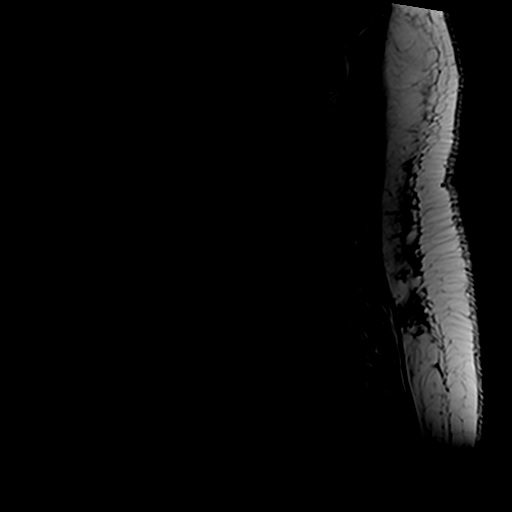
[im 13/13]
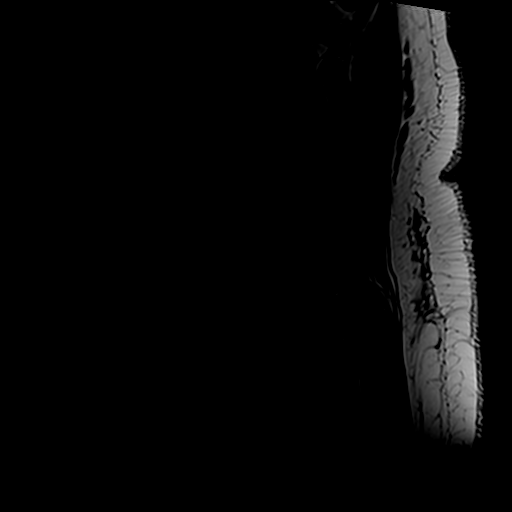

[Series 6: T1 · axial · 4.0mm · 0.35mm/px · z∈[-36,+137]mm · 5 of 36 slices shown (2 of 2)]
[im 3/36]
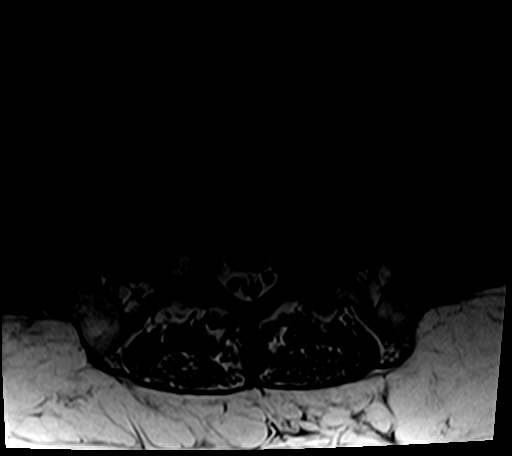
[im 5/36]
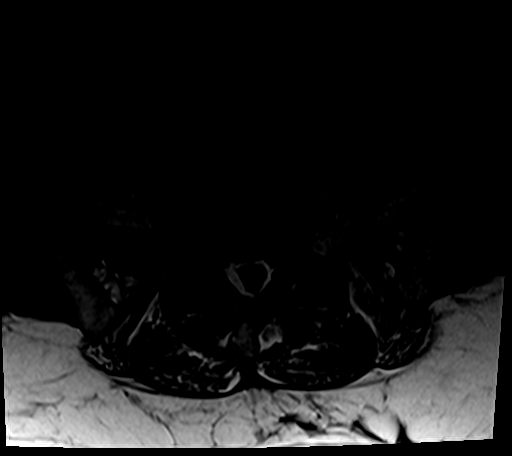
[im 8/36]
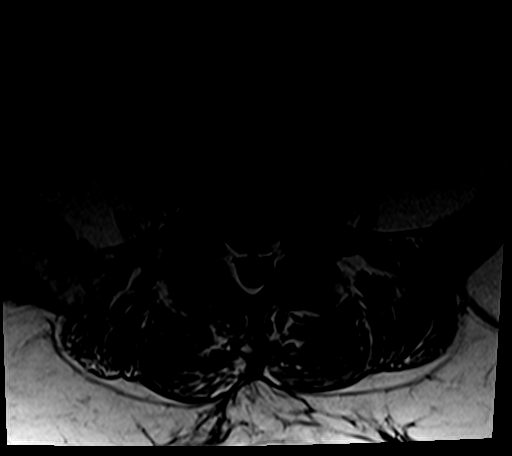
[im 19/36]
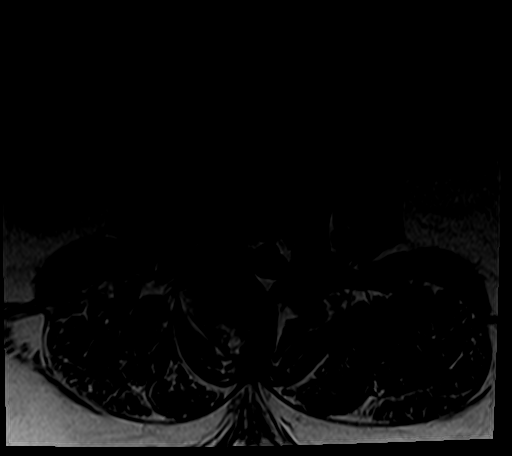
[im 31/36]
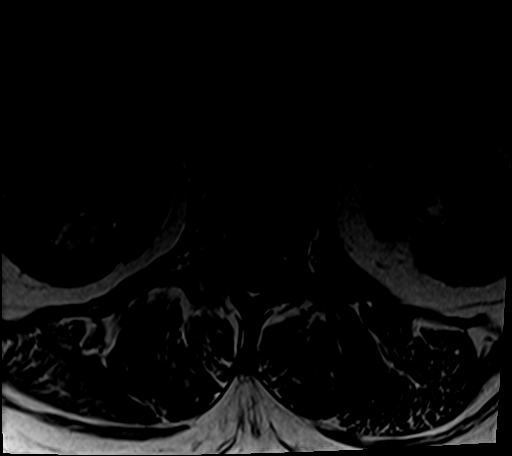

[Series 7: T2 · axial · 4.0mm · 0.70mm/px · z∈[-36,+163]mm · 10 of 36 slices shown]
[im 3/36]
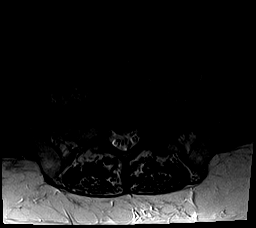
[im 5/36]
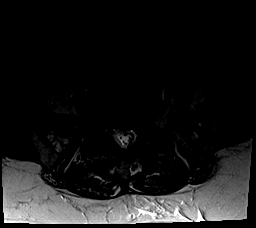
[im 8/36]
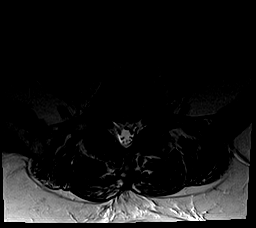
[im 12/36]
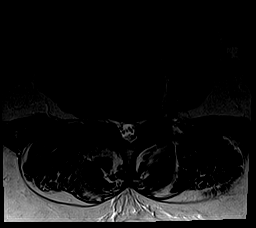
[im 17/36]
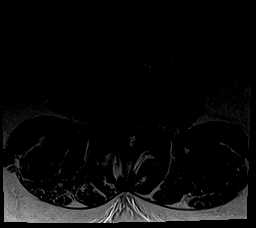
[im 19/36]
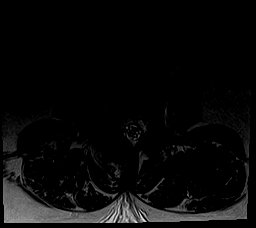
[im 22/36]
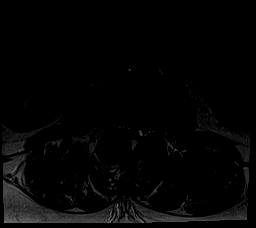
[im 26/36]
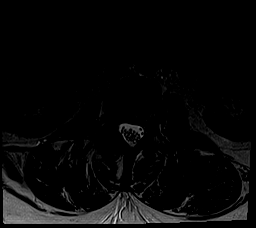
[im 31/36]
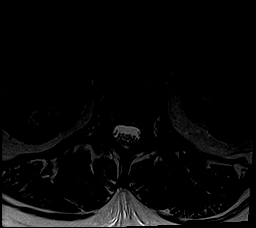
[im 36/36]
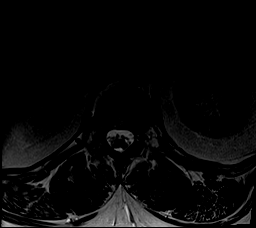

[26 of 48 positions shown; findings below may reference images not displayed]

FINDINGS: Segmentation:  Standard.

Alignment: Minimal grade 1 anterolisthesis of L3 on L4. Mild
levoscoliosis of the lumbar spine.

Vertebrae:  No fracture, evidence of discitis, or bone lesion.

Conus medullaris and cauda equina: Conus extends to the T12 level.
Conus and cauda equina appear normal.

Paraspinal and other soft tissues: No acute paraspinal abnormality.

Disc levels:

Disc spaces: Disc heights are maintained. Disc desiccation from L2-3
through L5-S1.

T12-L1: Mild broad-based disc bulge. No evidence of neural foraminal
stenosis. No central canal stenosis.

L1-L2: No significant disc bulge. Mild bilateral facet arthropathy.
No evidence of neural foraminal stenosis. No central canal stenosis.

L2-L3: Broad-based disc bulge. Moderate bilateral facet arthropathy,
right worse than left. Moderate spinal stenosis. Moderate right
foraminal stenosis. No left foraminal stenosis.

L3-L4: Broad-based disc bulge. Moderate bilateral facet arthropathy
with ligamentum flavum infolding. Moderate-severe spinal stenosis.
Moderate right foraminal stenosis. Mild left foraminal stenosis.

L4-L5: Broad-based disc bulge. Moderate bilateral facet arthropathy.
Mild right foraminal stenosis. Moderate-severe left foraminal
stenosis.

L5-S1: Small right paracentral disc protrusion contacting the right
intraspinal S1 nerve root. Mild right and moderate left facet
arthropathy. No central canal stenosis.
IMPRESSION: 1. At L2-3 there is a broad-based disc bulge. Moderate bilateral
facet arthropathy, right worse than left. Moderate spinal stenosis.
Moderate right foraminal stenosis. No left foraminal stenosis.
2. At L3-4 there is a disc bulge. Moderate bilateral facet
arthropathy with ligamentum flavum infolding. Moderate-severe spinal
stenosis. Moderate right foraminal stenosis. Mild left foraminal
stenosis.
3. At L4-5 there is a broad-based disc bulge. Moderate bilateral
facet arthropathy. Mild right foraminal stenosis. Moderate-severe
left foraminal stenosis.
4. At L5-S1 there is a small right paracentral disc protrusion
contacting the right intraspinal S1 nerve root. Mild right and
moderate left facet arthropathy.

## 2019-11-21 ENCOUNTER — Other Ambulatory Visit: Payer: Self-pay

## 2019-11-21 ENCOUNTER — Ambulatory Visit (HOSPITAL_COMMUNITY)
Admission: RE | Admit: 2019-11-21 | Discharge: 2019-11-21 | Disposition: A | Discharge status: Home / Self Care | Payer: Medicare HMO | Source: Ambulatory Visit | Attending: Family Medicine | Admitting: Family Medicine

## 2019-11-21 ENCOUNTER — Encounter (HOSPITAL_COMMUNITY): Payer: Self-pay

## 2019-11-21 DIAGNOSIS — R0789 Other chest pain: Secondary | ICD-10-CM | POA: Diagnosis not present

## 2019-11-22 ENCOUNTER — Ambulatory Visit (INDEPENDENT_AMBULATORY_CARE_PROVIDER_SITE_OTHER): Payer: Medicare HMO | Admitting: Family Medicine

## 2019-11-22 VITALS — BP 140/80 | HR 83 | Temp 97.9°F

## 2019-11-22 DIAGNOSIS — J449 Chronic obstructive pulmonary disease, unspecified: Secondary | ICD-10-CM

## 2019-11-22 DIAGNOSIS — J209 Acute bronchitis, unspecified: Secondary | ICD-10-CM

## 2019-11-22 DIAGNOSIS — Z720 Tobacco use: Secondary | ICD-10-CM

## 2019-11-22 MED ORDER — PREDNISONE 20 MG PO TABS: 40.0000 mg | ORAL_TABLET | Freq: Every day | ORAL | 0 refills | Status: AC

## 2019-11-22 NOTE — Progress Notes (Signed)
    SUBJECTIVE:   CHIEF COMPLAINT / HPI:   Chronic Cough Continues to endorse chronic cough with sputum production.  Started 1 month ago when she was found to have pneumonia.  Also has associated right-sided chest wall pain, loss of appetite, nausea/vomiting (greenish, white).  Also endorses headache.  Tobacco Use Endorses continued tobacco use last cigarette was Sunday which she said made her feel sick.  She also continues to chew Nicorette gum.  PERTINENT  PMH / PSH: Treated for pneumonia with Zithromax, amoxicillin, clindamycin (10/19/2019 chest x-ray with right middle lobe and right lung base density), Covid negative 09/22/2019  OBJECTIVE:   BP 140/80   Pulse 83   Temp 97.9 F (36.6 C) (Axillary)   SpO2 98%   General: Appears tired, no acute distress. Age appropriate. Cardiac: RRR, normal heart sounds, no murmurs Respiratory: CTAB, normal effort. Audible wet cough Neuro: alert and oriented, no focal deficits  CT CHEST WITHOUT CONTRAST 11/21/2019 COMPARISON:  None. IMPRESSION: 1. Bandlike scarring of the lateral segment right middle lobe and lingula. No acute airspace opacity. 2. No evidence of pleural effusion. 3. Mild mosaic attenuation of the airspaces, suggestive of small airways disease. 4. Coronary artery disease.  Aortic Atherosclerosis (ICD10-I70.0).  ASSESSMENT/PLAN:   Chronic bronchitis with COPD (chronic obstructive pulmonary disease) ([place]) Appears tired but satting at 98% on room air.  Lungs are clear to auscultation bilaterally.  CT chest without acute findings.  Patient will likely benefit from complete cessation of smoking.  Encouraged this as well as continued use of Nicorette gum and consideration of switching to patches if this will work better for patient in the future.  Return precautions given as well as red flags of when to present to the ED.  We will treat as an exacerbation outpatient in give 5 days of 40 mg of prednisone.  We will also refer to  pulmonology.  Consider repeating PFTs.  Will need to continue to follow-up with PCP as needed.  Tobacco abuse Would benefit from complete cessation.  Uses Nicorette gum.  Consider nicotine patches.  1 800 quit NOW card given to patient   Gerlene Fee, [place]

## 2019-11-22 NOTE — Patient Instructions (Addendum)
Please take prednisone 40 mg for 5 days.  We have also referred you to a pulmonologist.  If you have not heard from them to schedule an appointment within a week please call the office back and let us know.   Stop smoking. This is going to be most beneficial to your overall health.   Please call the clinic at [phone] if your symptoms worsen or you have any concerns. It was our pleasure to serve you.

## 2019-11-24 ENCOUNTER — Encounter: Payer: Self-pay | Admitting: Family Medicine

## 2019-11-24 NOTE — Assessment & Plan Note (Signed)
Would benefit from complete cessation.  Uses Nicorette gum.  Consider nicotine patches.  1 800 quit NOW card given to patient

## 2019-11-24 NOTE — Assessment & Plan Note (Signed)
Appears tired but satting at 98% on room air.  Lungs are clear to auscultation bilaterally.  CT chest without acute findings.  Patient will likely benefit from complete cessation of smoking.  Encouraged this as well as continued use of Nicorette gum and consideration of switching to patches if this will work better for patient in the future.  Return precautions given as well as red flags of when to present to the ED.  We will treat as an exacerbation outpatient in give 5 days of 40 mg of prednisone.  We will also refer to pulmonology.  Consider repeating PFTs.  Will need to continue to follow-up with PCP as needed.

## 2019-11-29 ENCOUNTER — Other Ambulatory Visit: Payer: Self-pay | Admitting: Physician Assistant

## 2019-12-28 ENCOUNTER — Other Ambulatory Visit: Payer: Self-pay | Admitting: Family Medicine

## 2019-12-28 DIAGNOSIS — E1142 Type 2 diabetes mellitus with diabetic polyneuropathy: Secondary | ICD-10-CM

## 2020-01-03 ENCOUNTER — Ambulatory Visit (HOSPITAL_COMMUNITY)
Admission: EM | Admit: 2020-01-03 | Discharge: 2020-01-03 | Disposition: A | Discharge status: Home / Self Care | Payer: Medicare HMO | Attending: Emergency Medicine | Admitting: Emergency Medicine

## 2020-01-03 ENCOUNTER — Encounter (HOSPITAL_COMMUNITY): Payer: Self-pay | Admitting: Emergency Medicine

## 2020-01-03 ENCOUNTER — Other Ambulatory Visit: Payer: Self-pay

## 2020-01-03 DIAGNOSIS — Z20822 Contact with and (suspected) exposure to covid-19: Secondary | ICD-10-CM | POA: Diagnosis not present

## 2020-01-03 DIAGNOSIS — J449 Chronic obstructive pulmonary disease, unspecified: Secondary | ICD-10-CM | POA: Diagnosis not present

## 2020-01-03 DIAGNOSIS — F17210 Nicotine dependence, cigarettes, uncomplicated: Secondary | ICD-10-CM | POA: Diagnosis not present

## 2020-01-03 DIAGNOSIS — Z7982 Long term (current) use of aspirin: Secondary | ICD-10-CM | POA: Insufficient documentation

## 2020-01-03 DIAGNOSIS — E119 Type 2 diabetes mellitus without complications: Secondary | ICD-10-CM | POA: Diagnosis not present

## 2020-01-03 DIAGNOSIS — I110 Hypertensive heart disease with heart failure: Secondary | ICD-10-CM | POA: Diagnosis not present

## 2020-01-03 DIAGNOSIS — J019 Acute sinusitis, unspecified: Secondary | ICD-10-CM | POA: Diagnosis not present

## 2020-01-03 DIAGNOSIS — I509 Heart failure, unspecified: Secondary | ICD-10-CM | POA: Diagnosis not present

## 2020-01-03 DIAGNOSIS — Z79899 Other long term (current) drug therapy: Secondary | ICD-10-CM | POA: Diagnosis not present

## 2020-01-03 DIAGNOSIS — Z7951 Long term (current) use of inhaled steroids: Secondary | ICD-10-CM | POA: Insufficient documentation

## 2020-01-03 DIAGNOSIS — Z794 Long term (current) use of insulin: Secondary | ICD-10-CM | POA: Insufficient documentation

## 2020-01-03 MED ORDER — PREDNISONE 20 MG PO TABS: 40.0000 mg | ORAL_TABLET | Freq: Every day | ORAL | 0 refills | Status: AC

## 2020-01-03 MED ORDER — BENZONATATE 200 MG PO CAPS: 200.0000 mg | ORAL_CAPSULE | Freq: Three times a day (TID) | ORAL | 0 refills | Status: AC | PRN

## 2020-01-03 MED ORDER — DM-GUAIFENESIN ER 30-600 MG PO TB12
1.0000 | ORAL_TABLET | Freq: Two times a day (BID) | ORAL | 0 refills | Status: DC
Start: 2020-01-03 — End: 2023-06-15

## 2020-01-03 MED ORDER — AMOXICILLIN-POT CLAVULANATE 875-125 MG PO TABS
1.0000 | ORAL_TABLET | Freq: Two times a day (BID) | ORAL | 0 refills | Status: AC
Start: 2020-01-03 — End: 2020-01-10
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
## 2020-01-03 NOTE — Discharge Instructions (Signed)
Begin Augmentin twice daily for 1 week to treat sinus infection Prednisone 40 mg daily with food, please monitor your sugars while on this medicine, as stop prednisone if spiking greater than 300 Tessalon for cough Mucinex DM to help with congestion and mucus Follow up if not improving or worsening

## 2020-01-03 NOTE — ED Triage Notes (Addendum)
Patient presents to [facility] for assessment of 1 week of cough, body aches, nasal congestion, sore throat, bilateral ear pain and headache

## 2020-01-04 LAB — SARS CORONAVIRUS 2 (TAT 6-24 HRS): SARS Coronavirus 2: NEGATIVE

## 2020-01-05 ENCOUNTER — Other Ambulatory Visit: Payer: Self-pay | Admitting: Physician Assistant

## 2020-01-11 ENCOUNTER — Ambulatory Visit: Payer: Medicare HMO | Admitting: Pulmonary Disease

## 2020-01-11 ENCOUNTER — Other Ambulatory Visit: Payer: Self-pay

## 2020-01-11 ENCOUNTER — Encounter: Payer: Self-pay | Admitting: Pulmonary Disease

## 2020-01-11 VITALS — BP 146/82 | HR 80 | Temp 99.3°F | Ht 63.0 in | Wt 205.4 lb

## 2020-01-11 DIAGNOSIS — R05 Cough: Secondary | ICD-10-CM | POA: Diagnosis not present

## 2020-01-11 DIAGNOSIS — J45909 Unspecified asthma, uncomplicated: Secondary | ICD-10-CM | POA: Diagnosis not present

## 2020-01-11 DIAGNOSIS — J329 Chronic sinusitis, unspecified: Secondary | ICD-10-CM | POA: Diagnosis not present

## 2020-01-11 DIAGNOSIS — R053 Chronic cough: Secondary | ICD-10-CM

## 2020-01-11 MED ORDER — IPRATROPIUM BROMIDE 0.03 % NA SOLN: 2.0000 | Freq: Three times a day (TID) | NASAL | 12 refills | Status: DC

## 2020-01-11 MED ORDER — LORATADINE 10 MG PO TABS: 10.0000 mg | ORAL_TABLET | Freq: Every day | ORAL | 11 refills | Status: DC
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
## 2020-01-11 NOTE — Patient Instructions (Addendum)
-   Start loratidine 10mg  daily for your allergies - Continue flonase nasal spray - Start ipratropium nasal spray 3 times daily for 3-4 days, then as needed there after - We schedule you for a CT of the sinuses - You can take tylenol or ibuprofen as directed on the bottle for your pain with coughing  - We will check labs in 1 month, you can come back to clinic to have your blood drawn

## 2020-01-12 ENCOUNTER — Telehealth: Payer: Self-pay | Admitting: Pulmonary Disease

## 2020-01-12 LAB — CBC WITH DIFFERENTIAL/PLATELET
Basophils Absolute: 0.1 10*3/uL (ref 0.0–0.1)
Basophils Relative: 1 % (ref 0.0–3.0)
Eosinophils Absolute: 0.2 10*3/uL (ref 0.0–0.7)
Eosinophils Relative: 2.2 % (ref 0.0–5.0)
HCT: 38.3 % (ref 36.0–46.0)
Hemoglobin: 12.2 g/dL (ref 12.0–15.0)
Lymphocytes Relative: 36.6 % (ref 12.0–46.0)
Lymphs Abs: 3.4 10*3/uL (ref 0.7–4.0)
MCHC: 31.8 g/dL (ref 30.0–36.0)
MCV: 83.5 fl (ref 78.0–100.0)
Monocytes Absolute: 0.6 10*3/uL (ref 0.1–1.0)
Monocytes Relative: 6.2 % (ref 3.0–12.0)
Neutro Abs: 5.1 10*3/uL (ref 1.4–7.7)
Neutrophils Relative %: 54 % (ref 43.0–77.0)
Platelets: 201 10*3/uL (ref 150.0–400.0)
RBC: 4.59 Mil/uL (ref 3.87–5.11)
RDW: 14.8 % (ref 11.5–15.5)
WBC: 9.4 10*3/uL (ref 4.0–10.5)

## 2020-01-12 LAB — IGE: IgE (Immunoglobulin E), Serum: 25 kU/L (ref <=114)

## 2020-01-12 NOTE — Telephone Encounter (Signed)
Tried calling the pt and there was no answer- LMTCB.  

## 2020-01-12 NOTE — Telephone Encounter (Signed)
Please schedule the patient for full PFT in 1 month along with a CBC w/ differential and IgE level. It looks like she had her labs drawn yesterday when I had planned for her to have them in a month. I have ordered the PFTs with the office note from yesterday. Thanks.

## 2020-01-13 NOTE — Telephone Encounter (Signed)
LMTCB  Will mail letter if no call back by 9/24

## 2020-01-17 ENCOUNTER — Encounter: Payer: Self-pay | Admitting: *Deleted

## 2020-01-17 NOTE — Telephone Encounter (Signed)
Attempted to call pt but unable to reach. Left message for her to return call. Due to multiple attempts trying to call pt and unable to reach, letter will be mailed to pt and encounter will be closed.

## 2020-01-20 ENCOUNTER — Ambulatory Visit (INDEPENDENT_AMBULATORY_CARE_PROVIDER_SITE_OTHER)
Admission: RE | Admit: 2020-01-20 | Discharge: 2020-01-20 | Disposition: A | Discharge status: Home / Self Care | Payer: Medicare HMO | Source: Ambulatory Visit | Attending: Pulmonary Disease | Admitting: Pulmonary Disease

## 2020-01-20 ENCOUNTER — Other Ambulatory Visit: Payer: Self-pay

## 2020-01-20 DIAGNOSIS — J329 Chronic sinusitis, unspecified: Secondary | ICD-10-CM | POA: Diagnosis not present

## 2020-01-21 ENCOUNTER — Other Ambulatory Visit: Payer: Self-pay | Admitting: Physician Assistant

## 2020-01-23 NOTE — Telephone Encounter (Signed)
This is Dr. Hilty's pt 

## 2020-01-24 ENCOUNTER — Telehealth: Payer: Self-pay | Admitting: Pulmonary Disease

## 2020-01-24 NOTE — Telephone Encounter (Signed)
Attempted to call pt but unable to reach. Left message for her to return call. 

## 2020-01-26 NOTE — Telephone Encounter (Signed)
Called and left detailed message that we will get results of CT sinus from provider and call her back.  Dr. Erin Fulling, please advise on results of CT sinus. Thanks.

## 2020-01-27 NOTE — Telephone Encounter (Signed)
The CT scan of the sinuses is normal. Her sinuses are greatly improved from previous scan she had. No need to send her to ENT at this time.  Thanks, Wille Glaser

## 2020-01-28 ENCOUNTER — Other Ambulatory Visit: Payer: Self-pay | Admitting: Family Medicine

## 2020-01-30 NOTE — Telephone Encounter (Signed)
ATC pt, VM was full. Will try back.

## 2020-01-31 ENCOUNTER — Encounter: Payer: Self-pay | Admitting: Emergency Medicine

## 2020-01-31 NOTE — Telephone Encounter (Signed)
Letter printed and mailed to the pt  °

## 2020-01-31 NOTE — Telephone Encounter (Signed)
ATC pt. VM box is full. Will send letter to pts home address as we have not been able to reach her. Will sign off on message once letter has been sent.    Triage, please print and mail letter (letter already generated) to pt and sign off on message once complete.

## 2020-02-06 NOTE — Telephone Encounter (Signed)
Patient is aware of results and voiced her understanding.  nothing further needed.

## 2020-02-10 ENCOUNTER — Ambulatory Visit (HOSPITAL_COMMUNITY)
Admission: RE | Admit: 2020-02-10 | Discharge: 2020-02-10 | Disposition: A | Discharge status: Home / Self Care | Payer: Medicare HMO | Source: Ambulatory Visit | Attending: Student | Admitting: Student

## 2020-02-10 ENCOUNTER — Other Ambulatory Visit: Payer: Self-pay

## 2020-02-10 ENCOUNTER — Other Ambulatory Visit (HOSPITAL_COMMUNITY): Payer: Self-pay | Admitting: Student

## 2020-02-10 DIAGNOSIS — K611 Rectal abscess: Secondary | ICD-10-CM | POA: Insufficient documentation

## 2020-02-10 LAB — POCT I-STAT CREATININE: Creatinine, Ser: 0.8 mg/dL (ref 0.44–1.00)

## 2020-02-10 MED ORDER — IOHEXOL 300 MG/ML  SOLN
100.0000 mL | Freq: Once | INTRAMUSCULAR | Status: AC | PRN
  Administered 2020-02-10: 100 mL via INTRAVENOUS

## 2020-03-09 ENCOUNTER — Other Ambulatory Visit: Payer: Self-pay | Admitting: Family Medicine

## 2020-03-19 ENCOUNTER — Other Ambulatory Visit: Payer: Self-pay | Admitting: Family Medicine

## 2020-03-19 DIAGNOSIS — E1142 Type 2 diabetes mellitus with diabetic polyneuropathy: Secondary | ICD-10-CM

## 2020-03-22 ENCOUNTER — Ambulatory Visit (INDEPENDENT_AMBULATORY_CARE_PROVIDER_SITE_OTHER): Payer: Medicare HMO | Admitting: Family Medicine

## 2020-03-22 ENCOUNTER — Encounter: Payer: Self-pay | Admitting: Family Medicine

## 2020-03-22 ENCOUNTER — Other Ambulatory Visit: Payer: Self-pay

## 2020-03-22 VITALS — BP 132/60 | HR 90 | Wt 204.8 lb

## 2020-03-22 DIAGNOSIS — K3184 Gastroparesis: Secondary | ICD-10-CM

## 2020-03-22 DIAGNOSIS — E1142 Type 2 diabetes mellitus with diabetic polyneuropathy: Secondary | ICD-10-CM

## 2020-03-22 DIAGNOSIS — J449 Chronic obstructive pulmonary disease, unspecified: Secondary | ICD-10-CM

## 2020-03-22 DIAGNOSIS — Z23 Encounter for immunization: Secondary | ICD-10-CM

## 2020-03-22 DIAGNOSIS — E1143 Type 2 diabetes mellitus with diabetic autonomic (poly)neuropathy: Secondary | ICD-10-CM

## 2020-03-22 DIAGNOSIS — F411 Generalized anxiety disorder: Secondary | ICD-10-CM

## 2020-03-22 MED ORDER — ONDANSETRON HCL 4 MG PO TABS: 4.0000 mg | ORAL_TABLET | Freq: Three times a day (TID) | ORAL | 0 refills | Status: DC | PRN

## 2020-03-22 MED ORDER — ALBUTEROL SULFATE (2.5 MG/3ML) 0.083% IN NEBU: 2.5000 mg | INHALATION_SOLUTION | Freq: Four times a day (QID) | RESPIRATORY_TRACT | 3 refills | Status: DC | PRN

## 2020-03-22 MED ORDER — HYDROXYZINE HCL 10 MG PO TABS: 10.0000 mg | ORAL_TABLET | Freq: Three times a day (TID) | ORAL | 0 refills | Status: DC | PRN

## 2020-03-22 MED ORDER — CETIRIZINE HCL 10 MG PO TABS
10.0000 mg | ORAL_TABLET | Freq: Every day | ORAL | 11 refills | Status: DC
Start: 2020-03-22 — End: 2020-07-10

## 2020-03-22 MED ORDER — GABAPENTIN 100 MG PO CAPS: ORAL_CAPSULE | ORAL | 0 refills | Status: DC

## 2020-03-22 NOTE — Patient Instructions (Addendum)
It was good to see you today.  Thank you for coming in.  I am refilling you Nebulizer solution, Gabapentin, Vistaril, Zyrtec, and Ondansetron.  You also received your Flu shot and COVID booster.  Please come back and see me in 1 month.  Be Well, Dr Manus Rudd

## 2020-03-23 DIAGNOSIS — Z23 Encounter for immunization: Secondary | ICD-10-CM | POA: Diagnosis not present

## 2020-03-27 ENCOUNTER — Ambulatory Visit: Payer: Medicare HMO

## 2020-03-31 NOTE — Progress Notes (Signed)
    SUBJECTIVE:   CHIEF COMPLAINT / HPI:   [name] [name] presents today for refill of several of her medications and COVID-19 booster and flu vaccine.     PERTINENT  PMH / PSH: CAD, Hypertension, HFpEF, COPD, Type 2 DM, Diabetic Periupheral Neurpathy, Hyperlipidemia, Chronic Back Pain, MDD  OBJECTIVE:   BP 132/60   Pulse 90   Wt 204 lb 12.8 oz (92.9 kg)   SpO2 98%   BMI 36.28 kg/m    Physical Exam Constitutional:      General: She is not in acute distress.    Appearance: Normal appearance. She is not ill-appearing.  HENT:     Head: Normocephalic and atraumatic.     Mouth/Throat:     Mouth: Mucous membranes are moist.  Cardiovascular:     Rate and Rhythm: Normal rate and regular rhythm.     Pulses: Normal pulses.  Pulmonary:     Effort: Pulmonary effort is normal.     Breath sounds: Normal breath sounds.  Neurological:     General: No focal deficit present.     Mental Status: She is alert and oriented to person, place, and time.     ASSESSMENT/PLAN:   Martin Majestic through extensive review of patient's medications.  Chronic bronchitis with COPD (chronic obstructive pulmonary disease) ([place]) Patient on Albuterol, Pulmicort, Zyrtec and Atrovent.   - refilled patient's nebulizer solution for Albuterol & Zyrtec  DM type 2 with diabetic peripheral neuropathy ([place]) Patient indicates having significant pain.  Takes tylenol for pain but indicates she ran out of Gabapentin and Tramadol.  Indicated will refill Gabapentin but want to avoid restarting Tramadol due to side effects.   - Refill Gababpentin  Anxiety state - Refill of Vistaril  Diabetic gastroparesis associated with type 2 diabetes mellitus Patient complaining of nausea. - Refilled Zofran with instruvtions to only take when needed for significant nausea     Delora Fuel, MD [place]

## 2020-03-31 NOTE — Assessment & Plan Note (Signed)
-   Refill of Vistaril

## 2020-03-31 NOTE — Assessment & Plan Note (Signed)
Patient complaining of nausea. - Refilled Zofran with instruvtions to only take when needed for significant nausea

## 2020-03-31 NOTE — Assessment & Plan Note (Addendum)
Patient on Albuterol, Pulmicort, Zyrtec and Atrovent.   - refilled patient's nebulizer solution for Albuterol & Zyrtec

## 2020-03-31 NOTE — Assessment & Plan Note (Signed)
Patient indicates having significant pain.  Takes tylenol for pain but indicates she ran out of Gabapentin and Tramadol.  Indicated will refill Gabapentin but want to avoid restarting Tramadol due to side effects.   - Refill Gababpentin

## 2020-04-03 ENCOUNTER — Other Ambulatory Visit: Payer: Self-pay

## 2020-04-03 NOTE — Progress Notes (Signed)
Refill request from [facility] for pantoprazole but has been d/c by another provider. Pt needs to call the office to discuss

## 2020-04-17 ENCOUNTER — Other Ambulatory Visit: Payer: Self-pay | Admitting: Family Medicine

## 2020-04-17 DIAGNOSIS — E1142 Type 2 diabetes mellitus with diabetic polyneuropathy: Secondary | ICD-10-CM

## 2020-05-01 ENCOUNTER — Other Ambulatory Visit: Payer: Self-pay | Admitting: Family Medicine

## 2020-05-26 DIAGNOSIS — J4520 Mild intermittent asthma, uncomplicated: Secondary | ICD-10-CM | POA: Diagnosis not present

## 2020-06-01 ENCOUNTER — Ambulatory Visit: Payer: Medicare HMO

## 2020-06-01 ENCOUNTER — Other Ambulatory Visit: Payer: Self-pay

## 2020-06-01 DIAGNOSIS — R6889 Other general symptoms and signs: Secondary | ICD-10-CM | POA: Diagnosis not present

## 2020-06-11 DIAGNOSIS — R6889 Other general symptoms and signs: Secondary | ICD-10-CM | POA: Diagnosis not present

## 2020-06-18 ENCOUNTER — Telehealth: Payer: Self-pay | Admitting: Family Medicine

## 2020-06-18 NOTE — Telephone Encounter (Signed)
Patient is calling to check on the status of having her surgical clearance form completed for the oral surgeon. We have received form and it is in Dr. Dorma Russell box. She said she needs to have her teeth removed as soon as possible and they will schedule the surgery once the paper work is completed signed and faxed back.   Please advise.

## 2020-06-19 ENCOUNTER — Telehealth: Payer: Self-pay | Admitting: Family Medicine

## 2020-06-19 DIAGNOSIS — Z8679 Personal history of other diseases of the circulatory system: Secondary | ICD-10-CM

## 2020-06-19 NOTE — Telephone Encounter (Signed)
Called and LVM for patient to call [facility] with questions or to schedule an appointment prior to dental surgery. Patient is required to get a Stress Test prior to surgical clearance.  Ozella Almond, [place]

## 2020-06-19 NOTE — Telephone Encounter (Signed)
Attempted to call patient.  Based on health history she will need to have stress test with Cardiologist before surgery.  If she would like to discuss this further please have her schedule appointnment.

## 2020-06-20 ENCOUNTER — Other Ambulatory Visit: Payer: Self-pay | Admitting: Family Medicine

## 2020-06-20 DIAGNOSIS — I2510 Atherosclerotic heart disease of native coronary artery without angina pectoris: Secondary | ICD-10-CM

## 2020-06-20 NOTE — Telephone Encounter (Signed)
Patient would like the referral for her stress test sent to Dr. Debara Pickett( [place] [facility]). Please advise.   Routing to PCP and Referral Coordinator.

## 2020-06-20 NOTE — Telephone Encounter (Signed)
Will ask provider to place order for stress test and to choose [facility] [address] as the location.  This way it can go into their workqueue for scheduling.  Please comment on order that it is needed for pre-op clearance.  Kalaysia Demonbreun,CMA

## 2020-06-21 NOTE — Telephone Encounter (Signed)
Ordered referral for stress test.

## 2020-07-10 ENCOUNTER — Other Ambulatory Visit: Payer: Self-pay | Admitting: Family Medicine

## 2020-07-10 ENCOUNTER — Telehealth: Payer: Self-pay | Admitting: Family Medicine

## 2020-07-10 ENCOUNTER — Other Ambulatory Visit: Payer: Self-pay

## 2020-07-10 DIAGNOSIS — B372 Candidiasis of skin and nail: Secondary | ICD-10-CM

## 2020-07-10 NOTE — Telephone Encounter (Signed)
Dr Lupita Leash office is calling to check on the status of patients surgical clearance form being completed. The form is no longer in Dr. Dorma Russell box. They would like to have this as soon as possible because they are not able to schedule patient until they have received form completed. Patient has to have surgery in a hospital setting and not in the office.   Please advise.

## 2020-07-10 NOTE — Telephone Encounter (Signed)
Good morning, I am covering Dr. Dorma Russell box as he is on vacation. If it is not in his box then he may have it with him. Not sure if this can wait until next Monday. Please let me know if there is anything I can do to help, thanks!

## 2020-07-11 ENCOUNTER — Other Ambulatory Visit: Payer: Self-pay | Admitting: Family Medicine

## 2020-07-11 DIAGNOSIS — B372 Candidiasis of skin and nail: Secondary | ICD-10-CM

## 2020-07-11 MED ORDER — FAMOTIDINE 40 MG PO TABS: ORAL_TABLET | ORAL | 0 refills | Status: DC

## 2020-07-11 MED ORDER — GABAPENTIN 100 MG PO CAPS: ORAL_CAPSULE | ORAL | 0 refills | Status: DC

## 2020-07-11 MED ORDER — CETIRIZINE HCL 10 MG PO TABS: 10.0000 mg | ORAL_TABLET | Freq: Every day | ORAL | 11 refills | Status: DC

## 2020-07-13 ENCOUNTER — Telehealth: Payer: Self-pay | Admitting: *Deleted

## 2020-07-13 NOTE — Telephone Encounter (Signed)
Patient left a message requesting a new referral to pain medicine for her leg pain.  She states that it has returned.  Jazmin Hartsell,CMA

## 2020-07-24 NOTE — Telephone Encounter (Signed)
Patient called and is upset that she hasn't heard anything about her stress test.  She is very concerned due to needing her teeth removed and is in a lot of pain from them.  She has had to wait for the clearance form to be done.  I looked into her chart and the order was placed incorrectly.  Stress test was ordered but a referral for cardiology was placed instead.  Patient does have an appt with Dr. Debara Pickett on 07-26-20 but she would still like the stress test order to be placed.  Orders pended for provider to review and sign.  Williette Loewe,CMA

## 2020-07-24 NOTE — Addendum Note (Signed)
Addended by: [name] Roys on: 07/24/2020 04:28 PM   Modules accepted: Orders

## 2020-07-25 NOTE — Telephone Encounter (Signed)
New form was faxed to our office.  Please complete ASAP so that patient can be scheduled for surgery. Christen Bame, CMA

## 2020-07-26 ENCOUNTER — Other Ambulatory Visit: Payer: Self-pay

## 2020-07-26 ENCOUNTER — Ambulatory Visit: Payer: Medicare HMO | Admitting: Internal Medicine

## 2020-07-26 ENCOUNTER — Encounter: Payer: Self-pay | Admitting: Internal Medicine

## 2020-07-26 ENCOUNTER — Ambulatory Visit: Payer: Medicare HMO | Admitting: Family Medicine

## 2020-07-26 VITALS — BP 132/70 | HR 76 | Ht 63.0 in | Wt 212.0 lb

## 2020-07-26 DIAGNOSIS — E782 Mixed hyperlipidemia: Secondary | ICD-10-CM | POA: Diagnosis not present

## 2020-07-26 DIAGNOSIS — I10 Essential (primary) hypertension: Secondary | ICD-10-CM

## 2020-07-26 DIAGNOSIS — R079 Chest pain, unspecified: Secondary | ICD-10-CM

## 2020-07-26 DIAGNOSIS — E119 Type 2 diabetes mellitus without complications: Secondary | ICD-10-CM

## 2020-07-26 NOTE — Patient Instructions (Signed)
Medication Instructions:  Your physician recommends that you continue on your current medications as directed. Please refer to the Current Medication list given to you today.  *If you need a refill on your cardiac medications before your next appointment, please call your pharmacy*   Testing/Procedures: Dr. Debara Pickett has ordered a Lexiscan Myocardial Perfusion Imaging Study. This is done at [address].  Please arrive 15 minutes prior to your appointment time for registration and insurance purposes.   The test will take approximately 3 to 4 hours to complete; you may bring reading material.  If someone comes with you to your appointment, they will need to remain in the main lobby due to limited space in the testing area. **If you are pregnant or breastfeeding, please notify the nuclear lab prior to your appointment**   How to prepare for your Myocardial Perfusion Test:  Do not eat or drink 3 hours prior to your test, except you may have water.  Do not consume products containing caffeine (regular or decaffeinated) 12 hours prior to your test. (ex: coffee, chocolate, sodas, tea).  Do wear comfortable clothes (no dresses or overalls) and walking shoes, tennis shoes preferred (No heels or open toe shoes are allowed).  Do NOT wear cologne, perfume, aftershave, or lotions (deodorant is allowed).  If you use an inhaler, use it the AM of your test and bring it with you.   If you use a nebulizer, use it the AM of your test.   If these instructions are not followed, your test will have to be rescheduled.    Follow-Up: At [facility], you and your health needs are our priority.  As part of our continuing mission to provide you with exceptional heart care, we have created designated Provider Care Teams.  These Care Teams include your primary Cardiologist (physician) and Advanced Practice Providers (APPs -  Physician Assistants and Nurse Practitioners) who all work together to  provide you with the care you need, when you need it.  We recommend signing up for the patient portal called "MyChart".  Sign up information is provided on this After Visit Summary.  MyChart is used to connect with patients for Virtual Visits (Telemedicine).  Patients are able to view lab/test results, encounter notes, upcoming appointments, etc.  Non-urgent messages can be sent to your provider as well.   To learn more about what you can do with MyChart, go to [email or web address].    Your next appointment:   6 month(s)  The format for your next appointment:   In Person  Provider:   You may see Pixie Casino, MD or one of the following Advanced Practice Providers on your designated Care Team:    Almyra Deforest, PA-C  Fabian Sharp, PA-C or   Roby Lofts, [place]    Other Instructions
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
## 2020-07-26 NOTE — Addendum Note (Signed)
Addended by: Delora Fuel on: 07/26/2020 02:14 PM   Modules accepted: Orders

## 2020-07-30 ENCOUNTER — Ambulatory Visit: Payer: Medicare HMO | Admitting: Physician Assistant

## 2020-08-02 ENCOUNTER — Telehealth (HOSPITAL_COMMUNITY): Payer: Self-pay | Admitting: *Deleted

## 2020-08-02 NOTE — Telephone Encounter (Signed)
Voice mailbox is full. Sent instruction letter via email.

## 2020-08-06 ENCOUNTER — Ambulatory Visit (HOSPITAL_COMMUNITY): Payer: Medicare HMO | Attending: Cardiology

## 2020-08-06 ENCOUNTER — Other Ambulatory Visit: Payer: Self-pay

## 2020-08-06 ENCOUNTER — Telehealth: Payer: Self-pay | Admitting: Internal Medicine

## 2020-08-06 DIAGNOSIS — R079 Chest pain, unspecified: Secondary | ICD-10-CM | POA: Diagnosis not present

## 2020-08-06 LAB — MYOCARDIAL PERFUSION IMAGING
LV dias vol: 73 mL (ref 46–106)
LV sys vol: 35 mL
Peak HR: 88 {beats}/min
Rest HR: 76 {beats}/min
SDS: 1
SRS: 0
SSS: 1
TID: 1.11

## 2020-08-06 MED ORDER — REGADENOSON 0.4 MG/5ML IV SOLN
0.4000 mg | Freq: Once | INTRAVENOUS | Status: AC
  Administered 2020-08-06: 0.4 mg via INTRAVENOUS

## 2020-08-06 MED ORDER — TECHNETIUM TC 99M PYROPHOSPHATE
11.0000 | Freq: Once | INTRAVENOUS | Status: AC
  Administered 2020-08-06: 11 via INTRAVENOUS

## 2020-08-06 MED ORDER — TECHNETIUM TC 99M TETROFOSMIN IV KIT
32.4000 | PACK | Freq: Once | INTRAVENOUS | Status: AC | PRN
Start: 2020-08-06 — End: 2020-08-06
  Administered 2020-08-06: 32.4 via INTRAVENOUS
  Filled 2020-08-06: qty 33

## 2020-08-06 NOTE — Telephone Encounter (Signed)
Patient wanted to know if she can still do her test today after taking her carvediolol

## 2020-08-06 NOTE — Telephone Encounter (Signed)
Spoke with pt on the phone with concerns about taking morning medications would interfere with pt's scheduled lexiscan today. Pt has taken her aspirin 81mg  and her amlodipine 10mg  with water. Pt states that she has not taken any other medications including her insulin. Pt states that she has only had water this morning and nothing that contains caffeine. Advised pt that she is ok to proceed with her study today. Reiterated to pt to not take any diabetic medication or any medication such as inhalers.  All of pt's questions answered and pt verbalizes understanding.

## 2020-08-07 ENCOUNTER — Emergency Department (HOSPITAL_COMMUNITY)
Admission: EM | Admit: 2020-08-07 | Discharge: 2020-08-07 | Disposition: A | Discharge status: Home / Self Care | Payer: Medicare HMO | Attending: Emergency Medicine | Admitting: Emergency Medicine

## 2020-08-07 ENCOUNTER — Emergency Department (HOSPITAL_COMMUNITY): Payer: Medicare HMO

## 2020-08-07 ENCOUNTER — Other Ambulatory Visit: Payer: Self-pay

## 2020-08-07 ENCOUNTER — Ambulatory Visit (HOSPITAL_COMMUNITY)
Admission: EM | Admit: 2020-08-07 | Discharge: 2020-08-07 | Disposition: A | Discharge status: Home / Self Care | Payer: Medicare HMO | Attending: Medical Oncology | Admitting: Medical Oncology

## 2020-08-07 ENCOUNTER — Encounter (HOSPITAL_COMMUNITY): Payer: Self-pay | Admitting: Emergency Medicine

## 2020-08-07 ENCOUNTER — Encounter (HOSPITAL_COMMUNITY): Payer: Self-pay

## 2020-08-07 DIAGNOSIS — R1011 Right upper quadrant pain: Secondary | ICD-10-CM | POA: Diagnosis not present

## 2020-08-07 DIAGNOSIS — F17210 Nicotine dependence, cigarettes, uncomplicated: Secondary | ICD-10-CM | POA: Insufficient documentation

## 2020-08-07 DIAGNOSIS — R319 Hematuria, unspecified: Secondary | ICD-10-CM | POA: Insufficient documentation

## 2020-08-07 DIAGNOSIS — E1140 Type 2 diabetes mellitus with diabetic neuropathy, unspecified: Secondary | ICD-10-CM | POA: Diagnosis not present

## 2020-08-07 DIAGNOSIS — I10 Essential (primary) hypertension: Secondary | ICD-10-CM | POA: Diagnosis not present

## 2020-08-07 DIAGNOSIS — Z7982 Long term (current) use of aspirin: Secondary | ICD-10-CM | POA: Diagnosis not present

## 2020-08-07 DIAGNOSIS — J45909 Unspecified asthma, uncomplicated: Secondary | ICD-10-CM | POA: Diagnosis not present

## 2020-08-07 DIAGNOSIS — I25119 Atherosclerotic heart disease of native coronary artery with unspecified angina pectoris: Secondary | ICD-10-CM | POA: Diagnosis not present

## 2020-08-07 DIAGNOSIS — R519 Headache, unspecified: Secondary | ICD-10-CM | POA: Diagnosis not present

## 2020-08-07 DIAGNOSIS — K219 Gastro-esophageal reflux disease without esophagitis: Secondary | ICD-10-CM | POA: Diagnosis not present

## 2020-08-07 DIAGNOSIS — G8929 Other chronic pain: Secondary | ICD-10-CM

## 2020-08-07 DIAGNOSIS — Z794 Long term (current) use of insulin: Secondary | ICD-10-CM | POA: Insufficient documentation

## 2020-08-07 DIAGNOSIS — I509 Heart failure, unspecified: Secondary | ICD-10-CM | POA: Insufficient documentation

## 2020-08-07 DIAGNOSIS — R1084 Generalized abdominal pain: Secondary | ICD-10-CM | POA: Insufficient documentation

## 2020-08-07 DIAGNOSIS — R109 Unspecified abdominal pain: Secondary | ICD-10-CM | POA: Diagnosis not present

## 2020-08-07 DIAGNOSIS — I110 Hypertensive heart disease with heart failure: Secondary | ICD-10-CM | POA: Insufficient documentation

## 2020-08-07 DIAGNOSIS — I160 Hypertensive urgency: Secondary | ICD-10-CM

## 2020-08-07 DIAGNOSIS — E1143 Type 2 diabetes mellitus with diabetic autonomic (poly)neuropathy: Secondary | ICD-10-CM | POA: Insufficient documentation

## 2020-08-07 DIAGNOSIS — Z91040 Latex allergy status: Secondary | ICD-10-CM | POA: Diagnosis not present

## 2020-08-07 DIAGNOSIS — Z7951 Long term (current) use of inhaled steroids: Secondary | ICD-10-CM | POA: Insufficient documentation

## 2020-08-07 DIAGNOSIS — Z79899 Other long term (current) drug therapy: Secondary | ICD-10-CM | POA: Insufficient documentation

## 2020-08-07 DIAGNOSIS — J449 Chronic obstructive pulmonary disease, unspecified: Secondary | ICD-10-CM | POA: Diagnosis not present

## 2020-08-07 DIAGNOSIS — Z85038 Personal history of other malignant neoplasm of large intestine: Secondary | ICD-10-CM | POA: Diagnosis not present

## 2020-08-07 LAB — POCT URINALYSIS DIPSTICK, ED / UC
Bilirubin Urine: NEGATIVE
Glucose, UA: 500 mg/dL — AB
Ketones, ur: NEGATIVE mg/dL
Leukocytes,Ua: NEGATIVE
Nitrite: NEGATIVE
Protein, ur: NEGATIVE mg/dL
Specific Gravity, Urine: 1.02 (ref 1.005–1.030)
Urobilinogen, UA: 0.2 mg/dL (ref 0.0–1.0)
pH: 6 (ref 5.0–8.0)

## 2020-08-07 LAB — CBC WITH DIFFERENTIAL/PLATELET
Abs Immature Granulocytes: 0.01 10*3/uL (ref 0.00–0.07)
Basophils Absolute: 0.1 10*3/uL (ref 0.0–0.1)
Basophils Relative: 1 %
Eosinophils Absolute: 0.1 10*3/uL (ref 0.0–0.5)
Eosinophils Relative: 2 %
HCT: 42.6 % (ref 36.0–46.0)
Hemoglobin: 13 g/dL (ref 12.0–15.0)
Immature Granulocytes: 0 %
Lymphocytes Relative: 46 %
Lymphs Abs: 2.5 10*3/uL (ref 0.7–4.0)
MCH: 27.6 pg (ref 26.0–34.0)
MCHC: 30.5 g/dL (ref 30.0–36.0)
MCV: 90.4 fL (ref 80.0–100.0)
Monocytes Absolute: 0.4 10*3/uL (ref 0.1–1.0)
Monocytes Relative: 7 %
Neutro Abs: 2.4 10*3/uL (ref 1.7–7.7)
Neutrophils Relative %: 44 %
Platelets: 158 10*3/uL (ref 150–400)
RBC: 4.71 MIL/uL (ref 3.87–5.11)
RDW: 15 % (ref 11.5–15.5)
WBC: 5.4 10*3/uL (ref 4.0–10.5)
nRBC: 0 % (ref 0.0–0.2)

## 2020-08-07 LAB — COMPREHENSIVE METABOLIC PANEL
ALT: 14 U/L (ref 0–44)
AST: 20 U/L (ref 15–41)
Albumin: 3.8 g/dL (ref 3.5–5.0)
Alkaline Phosphatase: 63 U/L (ref 38–126)
Anion gap: 8 (ref 5–15)
BUN: 10 mg/dL (ref 8–23)
CO2: 25 mmol/L (ref 22–32)
Calcium: 8.7 mg/dL — ABNORMAL LOW (ref 8.9–10.3)
Chloride: 105 mmol/L (ref 98–111)
Creatinine, Ser: 0.72 mg/dL (ref 0.44–1.00)
GFR, Estimated: >60 mL/min (ref >60)
Glucose, Bld: 140 mg/dL — ABNORMAL HIGH (ref 70–99)
Potassium: 4 mmol/L (ref 3.5–5.1)
Sodium: 138 mmol/L (ref 135–145)
Total Bilirubin: 0.5 mg/dL (ref 0.3–1.2)
Total Protein: 7.2 g/dL (ref 6.5–8.1)

## 2020-08-07 LAB — CBG MONITORING, ED: Glucose-Capillary: 177 mg/dL — ABNORMAL HIGH (ref 70–99)

## 2020-08-07 LAB — LIPASE, BLOOD: Lipase: 32 U/L (ref 11–51)

## 2020-08-07 MED ORDER — IOHEXOL 300 MG/ML  SOLN
100.0000 mL | Freq: Once | INTRAMUSCULAR | Status: AC | PRN
  Administered 2020-08-07: 100 mL via INTRAVENOUS

## 2020-08-07 MED ORDER — FENTANYL CITRATE (PF) 100 MCG/2ML IJ SOLN
50.0000 ug | Freq: Once | INTRAMUSCULAR | Status: AC
Start: 2020-08-07 — End: 2020-08-07
  Administered 2020-08-07: 50 ug via INTRAVENOUS
  Filled 2020-08-07: qty 2

## 2020-08-07 MED ORDER — FENTANYL CITRATE (PF) 100 MCG/2ML IJ SOLN
50.0000 ug | Freq: Once | INTRAMUSCULAR | Status: AC
  Administered 2020-08-07: 50 ug via INTRAVENOUS
  Filled 2020-08-07: qty 2

## 2020-08-07 MED ORDER — ONDANSETRON HCL 4 MG/2ML IJ SOLN
4.0000 mg | Freq: Once | INTRAMUSCULAR | Status: AC
  Administered 2020-08-07: 4 mg via INTRAVENOUS
  Filled 2020-08-07: qty 2

## 2020-08-07 NOTE — ED Triage Notes (Signed)
Pt here from UC  With c/o right upper quadrant pain and htn

## 2020-08-07 NOTE — Discharge Instructions (Signed)
Your lab work and imaging are reassuring here in the emergency department.  I would follow-up with [facility] for the blood in your urine.  Call your primary care doctor to schedule an appointment for your pain management.  Return for any worsening symptoms.

## 2020-08-07 NOTE — ED Notes (Signed)
Patient is being discharged from the Urgent Care and sent to the Emergency Department via wheelchair . Per [place], pa, patient is in need of higher level of care due to abdominal pain, back pain, hypertension. Patient is aware and verbalizes understanding of plan of care.  Vitals:   08/07/20 1144  BP: (!) 194/75  Pulse: 86  Resp: (!) 21  Temp: 98.4 F (36.9 C)  SpO2: 100%

## 2020-08-07 NOTE — ED Notes (Signed)
Pt to CT

## 2020-08-07 NOTE — ED Triage Notes (Signed)
Pt reports :"stabbing" pain in abdomen,  lower back, legs,knees, right arm; pounding  headache x 4 weeks. Tylenol gives no relief.    Pt requested refills: Amlodipine 10 mg (last dose 2 weeks ago) Albuterol inhaler

## 2020-08-07 NOTE — ED Triage Notes (Signed)
Emergency Medicine Provider Triage Evaluation Note  [name] [name] , a 70 y.o. female  was evaluated in triage.  Pt complains of abdominal pain, back pain, joint pain, muscle pain for 4 weeks. No CP or SOB. Sent her from [facility] for hypertensive emergency? Pt did not take BP yesterday.   Review of Systems  Positive: abdominal pain, back pain, myalgias Negative: fever  Physical Exam  BP (!) 168/78 (BP Location: Left Arm)   Pulse 79   Temp 98.6 F (37 C)   Resp 16   SpO2 100%  Gen:   Awake, no distress   HEENT:  Atraumatic  Resp:  Normal effort  Cardiac:  Normal rate  Abd:   Nondistended, nontender  MSK:   Moves extremities without difficulty  Neuro:  Speech clear   Medical Decision Making  Medically screening exam initiated at 2:24 PM.  Appropriate orders placed.  [name] [name] was informed that the remainder of the evaluation will be completed by another provider, this initial triage assessment does not replace that evaluation, and the importance of remaining in the ED until their evaluation is complete.  Clinical Impression  Stable  MSE was initiated and I personally evaluated the patient and placed orders (if any) at  2:29 PM on August 07, 2020.  The patient appears stable so that the remainder of the MSE may be completed by another provider.    Alfredia Client, PA-C 08/07/20 1429
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
## 2020-08-08 ENCOUNTER — Telehealth: Payer: Self-pay | Admitting: *Deleted

## 2020-08-08 NOTE — Telephone Encounter (Signed)
Patient called regarding her dental surgery clearance form.  Form was refaxed by Dr. Lupita Leash for completion.  Patient is very upset as she has been waiting for over a month to have this clearance and had to be seen in the ED for her mouth issues.  Form is in providers box and also patient has completed both her visit with cardiology and the stress test for provider to review for the form.   Jamian Andujo,CMA

## 2020-08-09 ENCOUNTER — Other Ambulatory Visit: Payer: Self-pay | Admitting: Family Medicine

## 2020-08-09 DIAGNOSIS — E1142 Type 2 diabetes mellitus with diabetic polyneuropathy: Secondary | ICD-10-CM

## 2020-08-09 NOTE — Telephone Encounter (Signed)
Patient is calling stating she is completely out of insulin and is needing it called in today, patient thought tomorrow was Saturday stated that her sugar has her disoriented. She states she is really needing this called in today. Please advise. Thanks!

## 2020-08-13 ENCOUNTER — Other Ambulatory Visit: Payer: Self-pay | Admitting: Family Medicine

## 2020-08-13 NOTE — Telephone Encounter (Signed)
Received update from provider that this form was completed and faxed back. Tanya Crothers,CMA

## 2020-08-28 ENCOUNTER — Other Ambulatory Visit: Payer: Self-pay | Admitting: Internal Medicine

## 2020-08-28 ENCOUNTER — Encounter: Payer: Self-pay | Admitting: Internal Medicine

## 2020-08-29 ENCOUNTER — Telehealth: Payer: Self-pay | Admitting: *Deleted

## 2020-08-29 DIAGNOSIS — M25562 Pain in left knee: Secondary | ICD-10-CM

## 2020-08-29 DIAGNOSIS — G8929 Other chronic pain: Secondary | ICD-10-CM

## 2020-08-29 NOTE — Telephone Encounter (Signed)
Patient left voicemail on referral line requesting to see a specialist for her knee, back and ankle pain.  Will forward to MD to either place referral for ortho or sports medicine.  Bertin Inabinet,CMA

## 2020-09-14 ENCOUNTER — Other Ambulatory Visit: Payer: Self-pay

## 2020-09-14 MED ORDER — ONDANSETRON HCL 4 MG PO TABS: 4.0000 mg | ORAL_TABLET | Freq: Three times a day (TID) | ORAL | 0 refills | Status: DC | PRN

## 2020-09-27 ENCOUNTER — Telehealth: Payer: Self-pay | Admitting: *Deleted

## 2020-09-27 DIAGNOSIS — G8929 Other chronic pain: Secondary | ICD-10-CM

## 2020-09-27 NOTE — Telephone Encounter (Signed)
Patient would like to be referred back to [facility] for back pain.  Will forward to MD. Johnney Ou

## 2020-09-28 ENCOUNTER — Other Ambulatory Visit: Payer: Self-pay | Admitting: Family Medicine

## 2020-10-15 NOTE — Telephone Encounter (Signed)
LVM for patient to call office to schedule an appointment.  .[name] [name] [name] [name], CMA  

## 2020-10-28 ENCOUNTER — Other Ambulatory Visit: Payer: Self-pay | Admitting: Family Medicine

## 2020-10-29 ENCOUNTER — Other Ambulatory Visit: Payer: Self-pay

## 2020-10-29 MED ORDER — GABAPENTIN 100 MG PO CAPS: ORAL_CAPSULE | ORAL | 0 refills | Status: DC

## 2020-11-09 ENCOUNTER — Other Ambulatory Visit: Payer: Self-pay

## 2020-11-09 DIAGNOSIS — E1142 Type 2 diabetes mellitus with diabetic polyneuropathy: Secondary | ICD-10-CM

## 2020-11-09 MED ORDER — LANTUS SOLOSTAR 100 UNIT/ML ~~LOC~~ SOPN: PEN_INJECTOR | SUBCUTANEOUS | 0 refills | Status: DC

## 2020-11-26 ENCOUNTER — Ambulatory Visit: Payer: Medicare HMO | Admitting: Family Medicine

## 2020-12-04 NOTE — Patient Instructions (Addendum)
It was nice seeing you today!  Call [facility] for a follow-up appointment. [phone]  Take Protonix for abdominal pain. Stop famotidine.  Schedule visit with pharmacy clinic for further management of your diabetes.  Follow-up with your PCP within 1 month.  Please arrive at least 15 minutes prior to your scheduled appointments.  Stay well, [name] Button, MD [place] [phone]

## 2020-12-04 NOTE — Progress Notes (Signed)
SUBJECTIVE:   CHIEF COMPLAINT / HPI: physical, abdominal pain, headaches  Patient was recently seen in the ED 4 months ago with multiple concerns. Had reported headache for 4 weeks, found to have nonfocal neuro exam and unremarkable head CT. Also with abdominal pain for 3 weeks, found to have unremarkable CT AP and labs (CBC, BMP, lipase, UA). Also with intermittent paresthesias on lower extremities consistent with diabetic neuropathy.  Head injury Patient reports she had a fall 2 weeks ago in the bathroom because her nightlight was not on and she fell and hit her head.  She states it was a minor injury and did not have any loss of consciousness.  She did not get evaluated for this when this happened.  She has had chronic headaches, but states that her headaches have not been worse after the fall.  She is on aspirin but not on any other blood thinners.  She is primarily concerned about her abdominal pain and is not concerned about her recent head injury.  Chronic abdominal pain Abdominal pain for years, described as burning.  Located in the LLQ, LUQ, and epigastric region.  Worsening. Worse with eating any kind of food after 15 minutes of consumption.  She also has associated nausea and vomiting which is chronic.  EGD done 2-3 months ago.  Denies blood in stool or vomit, but states that she has had dark stools for the past week. She has been taking Tylenol and ibuprofen (reports opted 2-3 200 mg tablets a day) for pain.  Used to use Goody powder but stopped using this.  Wants something stronger for pain. On chart review, she had an EGD 07/12/2019 which revealed a duodenal adenoma, mild reactive gastropathy, negative H. pylori, and no evidence of malignancy.  She also had a colonoscopy 08/15/2019 which revealed 2 separate adenomatous without high-grade dysplasia, recommended to repeat in 3 years.  T2DM AM sugars mostly in the 200s. Has been as low as 90. Patient states her eating is not consistent  due to abdominal pain Denies any recent hypoglycemic episodes, but states that she does have episodes occasionally when she feels weak but does not check her blood sugar.  HTN On carvedilol 25 mg twice daily, losartan 25 mg, and amlodipine 10 mg. Patient states that she has been taking the carvedilol and losartan regularly.  She states that her amlodipine was not refilled last time so she has not been taking this.  Candidal intertrigo Reports irritation under her pannus.  States she ran out of her nystatin several weeks ago and is requesting for refill.  PERTINENT  PMH / PSH: CAD, HFpEF, HTN, T2DM, HLD, CVA, depression, anxiety, NAFLD, tobacco abuse  OBJECTIVE:   BP (!) 167/79   Pulse 81   Ht '5\' 3"'$  (1.6 m)   Wt 206 lb 9.6 oz (93.7 kg)   SpO2 97%   BMI 36.60 kg/m   General: Obese older female, NAD Eyes: PERRL, EOMI CV: RRR, no murmurs Pulm: CTAB, no wheezes or rales Abdomen: Soft, diffuse tenderness, positive bowel sounds Neuro: CN II through XII intact, finger-to-nose intact, 5/5 strength in all extremities  ASSESSMENT/PLAN:   Essential hypertension Likely elevated secondary to running out of her amlodipine and possible pain component.  Amlodipine refilled today, she will continue losartan and carvedilol.  DM type 2 with diabetic peripheral neuropathy (HCC) Not well controlled, A1c 9.0 today, increased from prior.  She is on Lantus 20 units twice a day.  Given time constraints from multiple other  issues, was not able to address in depth.  We will not make any changes today given lability of CBGs and inconsistent eating habits.  Recommend follow-up with pharmacy clinic and recommended follow-up with PCP within 1 month.  Abdominal pain Chronic abdominal pain with history of GERD and diabetic gastroparesis likely contributing to symptoms.  EGD and colonoscopy done 1 year ago largely unrevealing.  Given NSAID use and reported dark stools, concern for possible peptic ulcer.  Will  obtain CBC today to assess for anemia.  Given cardiac history and worsening of pain with eating, intestinal angina could also be a consideration.  Recommended follow-up with GI, contact information given to schedule appointment.  For now, will switch H2 blocker to PPI which may provide more symptomatic improvement.  Candidal intertrigo Likely worsening from running out of nystatin powder.  Refilled nystatin powder.   Head injury Occurred 2 weeks ago.  Appears to be a minor head injury without LOC.  Patient is neurologically intact.  Most recent CT head was unremarkable 4 months ago.  Low suspicion for intracranial bleed at this point, do not feel repeat imaging is indicated.   Zola Button, MD [place]

## 2020-12-05 ENCOUNTER — Other Ambulatory Visit: Payer: Self-pay

## 2020-12-05 ENCOUNTER — Encounter: Payer: Self-pay | Admitting: Family Medicine

## 2020-12-05 ENCOUNTER — Ambulatory Visit (INDEPENDENT_AMBULATORY_CARE_PROVIDER_SITE_OTHER): Payer: Medicare HMO | Admitting: Family Medicine

## 2020-12-05 ENCOUNTER — Ambulatory Visit (INDEPENDENT_AMBULATORY_CARE_PROVIDER_SITE_OTHER): Payer: Medicare HMO

## 2020-12-05 VITALS — BP 167/79 | HR 81 | Ht 63.0 in | Wt 206.6 lb

## 2020-12-05 DIAGNOSIS — R109 Unspecified abdominal pain: Secondary | ICD-10-CM | POA: Diagnosis not present

## 2020-12-05 DIAGNOSIS — B372 Candidiasis of skin and nail: Secondary | ICD-10-CM

## 2020-12-05 DIAGNOSIS — J329 Chronic sinusitis, unspecified: Secondary | ICD-10-CM | POA: Diagnosis not present

## 2020-12-05 DIAGNOSIS — Z23 Encounter for immunization: Secondary | ICD-10-CM | POA: Diagnosis not present

## 2020-12-05 DIAGNOSIS — I10 Essential (primary) hypertension: Secondary | ICD-10-CM

## 2020-12-05 DIAGNOSIS — R195 Other fecal abnormalities: Secondary | ICD-10-CM | POA: Diagnosis not present

## 2020-12-05 DIAGNOSIS — G8929 Other chronic pain: Secondary | ICD-10-CM

## 2020-12-05 DIAGNOSIS — E1142 Type 2 diabetes mellitus with diabetic polyneuropathy: Secondary | ICD-10-CM

## 2020-12-05 DIAGNOSIS — R6889 Other general symptoms and signs: Secondary | ICD-10-CM | POA: Diagnosis not present

## 2020-12-05 LAB — POCT GLYCOSYLATED HEMOGLOBIN (HGB A1C): HbA1c, POC (controlled diabetic range): 9 % — AB (ref 0.0–7.0)

## 2020-12-05 MED ORDER — BD PEN NEEDLE SHORT U/F 31G X 8 MM MISC: 99 refills | Status: DC

## 2020-12-05 MED ORDER — PANTOPRAZOLE SODIUM 40 MG PO TBEC: 40.0000 mg | DELAYED_RELEASE_TABLET | Freq: Every day | ORAL | 1 refills | Status: DC

## 2020-12-05 MED ORDER — NYSTATIN 100000 UNIT/GM EX POWD: Freq: Three times a day (TID) | CUTANEOUS | 3 refills | Status: DC

## 2020-12-05 MED ORDER — AMLODIPINE BESYLATE 10 MG PO TABS: ORAL_TABLET | ORAL | 9 refills | Status: DC

## 2020-12-05 MED ORDER — IPRATROPIUM BROMIDE 0.03 % NA SOLN: 2.0000 | Freq: Three times a day (TID) | NASAL | 12 refills | Status: DC

## 2020-12-05 MED ORDER — GABAPENTIN 100 MG PO CAPS: ORAL_CAPSULE | ORAL | 0 refills | Status: DC

## 2020-12-05 MED ORDER — ALBUTEROL SULFATE (2.5 MG/3ML) 0.083% IN NEBU: 2.5000 mg | INHALATION_SOLUTION | Freq: Four times a day (QID) | RESPIRATORY_TRACT | 3 refills | Status: DC | PRN

## 2020-12-05 MED ORDER — ONDANSETRON HCL 4 MG PO TABS: 4.0000 mg | ORAL_TABLET | Freq: Three times a day (TID) | ORAL | 0 refills | Status: DC | PRN

## 2020-12-05 NOTE — Assessment & Plan Note (Signed)
Likely worsening from running out of nystatin powder.  Refilled nystatin powder.

## 2020-12-05 NOTE — Assessment & Plan Note (Signed)
Not well controlled, A1c 9.0 today, increased from prior.  She is on Lantus 20 units twice a day.  Given time constraints from multiple other issues, was not able to address in depth.  We will not make any changes today given lability of CBGs and inconsistent eating habits.  Recommend follow-up with pharmacy clinic and recommended follow-up with PCP within 1 month.

## 2020-12-05 NOTE — Assessment & Plan Note (Signed)
Chronic abdominal pain with history of GERD and diabetic gastroparesis likely contributing to symptoms.  EGD and colonoscopy done 1 year ago largely unrevealing.  Given NSAID use and reported dark stools, concern for possible peptic ulcer.  Will obtain CBC today to assess for anemia.  Given cardiac history and worsening of pain with eating, intestinal angina could also be a consideration.  Recommended follow-up with GI, contact information given to schedule appointment.  For now, will switch H2 blocker to PPI which may provide more symptomatic improvement.

## 2020-12-05 NOTE — Assessment & Plan Note (Signed)
Likely elevated secondary to running out of her amlodipine and possible pain component.  Amlodipine refilled today, she will continue losartan and carvedilol.

## 2020-12-06 LAB — CBC
Hematocrit: 40 % (ref 34.0–46.6)
Hemoglobin: 13.2 g/dL (ref 11.1–15.9)
MCH: 27.8 pg (ref 26.6–33.0)
MCHC: 33 g/dL (ref 31.5–35.7)
MCV: 84 fL (ref 79–97)
Platelets: 160 10*3/uL (ref 150–450)
RBC: 4.74 x10E6/uL (ref 3.77–5.28)
RDW: 12.4 % (ref 11.7–15.4)
WBC: 7.9 10*3/uL (ref 3.4–10.8)

## 2020-12-07 ENCOUNTER — Telehealth: Payer: Self-pay | Admitting: Family Medicine

## 2020-12-07 ENCOUNTER — Encounter: Payer: Self-pay | Admitting: Family Medicine

## 2020-12-07 NOTE — Telephone Encounter (Signed)
Spoke with patient's granddaughter.  Informed of normal test results.  She will pass on message regarding test results and to schedule follow-up visit with PCP and Dr. Valentina Lucks.

## 2020-12-10 ENCOUNTER — Encounter: Payer: Self-pay | Admitting: Gastroenterology

## 2020-12-10 ENCOUNTER — Telehealth: Payer: Self-pay

## 2020-12-10 NOTE — Telephone Encounter (Signed)
Placed call to patient to schedule Annual wellness visit. LVM

## 2021-01-26 ENCOUNTER — Other Ambulatory Visit: Payer: Self-pay | Admitting: Family Medicine

## 2021-01-26 DIAGNOSIS — G8929 Other chronic pain: Secondary | ICD-10-CM

## 2021-01-31 ENCOUNTER — Other Ambulatory Visit: Payer: Self-pay

## 2021-01-31 ENCOUNTER — Ambulatory Visit (INDEPENDENT_AMBULATORY_CARE_PROVIDER_SITE_OTHER): Payer: Medicare HMO

## 2021-01-31 DIAGNOSIS — G8929 Other chronic pain: Secondary | ICD-10-CM

## 2021-01-31 DIAGNOSIS — Z23 Encounter for immunization: Secondary | ICD-10-CM

## 2021-01-31 DIAGNOSIS — R6889 Other general symptoms and signs: Secondary | ICD-10-CM | POA: Diagnosis not present

## 2021-01-31 MED ORDER — PANTOPRAZOLE SODIUM 40 MG PO TBEC: DELAYED_RELEASE_TABLET | ORAL | 0 refills | Status: DC

## 2021-01-31 MED ORDER — NYSTATIN 100000 UNIT/ML MT SUSP: 200000.0000 [IU] | Freq: Four times a day (QID) | OROMUCOSAL | 1 refills | Status: DC

## 2021-01-31 NOTE — Progress Notes (Signed)
Patient presents to nurse clinic for flu vaccination. Administered in RD, site unremarkable, tolerated injection well.   Jacky Dross C Chanice Brenton, RN  

## 2021-02-04 ENCOUNTER — Telehealth: Payer: Self-pay | Admitting: Family Medicine

## 2021-02-04 NOTE — Telephone Encounter (Signed)
Patient would like a referral to [facility]. She said she needs the referral for her pain in hips/legs/ankles.    The best call back number with any questions is [phone].

## 2021-02-04 NOTE — Telephone Encounter (Signed)
Routed message to PCP. Tymeka Privette, CMA  

## 2021-02-05 ENCOUNTER — Other Ambulatory Visit: Payer: Self-pay | Admitting: Internal Medicine

## 2021-02-05 NOTE — Telephone Encounter (Signed)
Please let patient know we can discuss referral at upcoming visit or she can schedule a separate visit to discuss. Thank you!

## 2021-02-21 NOTE — Progress Notes (Signed)
    SUBJECTIVE:   CHIEF COMPLAINT / HPI:   HTN Medications include amlodipine 10 mg, carvedilol 25 mg twice daily, and losartan 25 mg Patient states she has been adherent with her medications, takes her losartan at night so is not taking this today Granddaughter measures her BP at home, she is unsure what her readings have been but states they are usually high  T2DM Medications include Lantus 20 units BID. Reports fasting sugars are usually in the 200s, as high as 250. Denies any hypoglycemic episodes.  Chronic lower back pain Previously was seeing [facility], last evaluated November 2020 for chronic bilateral hip pain and low back pain radiating down both legs. MRI had revealed multifactorial stenosis at multiple levels causing central stenosis and foraminal stenosis at multiple levels. Offered neurosurgery referral vs pain management. Not interested in surgery at that time, she was referred to pain management. She had been evaluated at [facility] April 2021, pain contract signed and started on tramadol.  She has had chronic lower back pain radiating down the back of both legs.  She is still having significant pain down both legs and is asking about increasing her gabapentin.  She ran out of her gabapentin 1 week ago.  She states she has had multiple surgeries and would not be amenable to another surgery.  Denies leg weakness, saddle anesthesia, and bowel/bladder incontinence.  She will sometimes have accidents if she is not able to get to the bathroom in time.  She lives with her granddaughter who helps her with her medications and checking her blood sugars and blood pressure.  PERTINENT  PMH / PSH: Spinal stenosis, T2DM, HTN, COPD, HFpEF, CAD  OBJECTIVE:   BP (!) 156/78   Pulse 83   Ht 5\' 3"  (1.6 m)   Wt 213 lb (96.6 kg)   SpO2 97%   BMI 37.73 kg/m   General: Alert, seated in chair with cane, NAD CV: RRR, no murmurs Pulm: CTAB, no wheezes or rales MSK: No tenderness  along midline spine Neuro: Full strength in bilateral lower extremities, 2+ reflexes patellar and Achilles   ASSESSMENT/PLAN:   Chronic pain syndrome Chronic lower back pain radiating down bilateral legs in the setting of spinal stenosis demonstrated on previous MRI.  No red flag symptoms.  Contact information for [facility] given to reestablish, can place new referral if necessary.  Will increase gabapentin to 300 mg twice daily in the meantime.  DM type 2 with diabetic peripheral neuropathy (HCC) A1c improved from prior but not at goal.  Will increase Lantus to 22 units twice daily.  She may be a candidate for GLP-1 if her GI issues resolved.  Essential hypertension Not at goal after re-check. Increase losartan to 50 mg. Continue amlodipine and carvedilol. Advised to keep log of BP readings. - return in 2 weeks for nurse visit for BP check - BMP in 2 weeks, future orders placed     Zola Button, MD [place]

## 2021-02-22 NOTE — Telephone Encounter (Signed)
Spoke with pt. Informed of note below and reminded of appt on 11/9 at 1:30. Pt understood and wrote down the time and day of appt. Salvatore Marvel, CMA

## 2021-02-27 ENCOUNTER — Other Ambulatory Visit: Payer: Self-pay

## 2021-02-27 ENCOUNTER — Encounter: Payer: Self-pay | Admitting: Family Medicine

## 2021-02-27 ENCOUNTER — Ambulatory Visit (INDEPENDENT_AMBULATORY_CARE_PROVIDER_SITE_OTHER): Payer: Medicare HMO | Admitting: Family Medicine

## 2021-02-27 VITALS — BP 156/78 | HR 83 | Ht 63.0 in | Wt 213.0 lb

## 2021-02-27 DIAGNOSIS — E1142 Type 2 diabetes mellitus with diabetic polyneuropathy: Secondary | ICD-10-CM

## 2021-02-27 DIAGNOSIS — I10 Essential (primary) hypertension: Secondary | ICD-10-CM | POA: Diagnosis not present

## 2021-02-27 DIAGNOSIS — G894 Chronic pain syndrome: Secondary | ICD-10-CM

## 2021-02-27 DIAGNOSIS — R6889 Other general symptoms and signs: Secondary | ICD-10-CM | POA: Diagnosis not present

## 2021-02-27 LAB — POCT GLYCOSYLATED HEMOGLOBIN (HGB A1C): HbA1c, POC (controlled diabetic range): 8.4 % — AB (ref 0.0–7.0)

## 2021-02-27 MED ORDER — ALBUTEROL SULFATE (2.5 MG/3ML) 0.083% IN NEBU: 2.5000 mg | INHALATION_SOLUTION | Freq: Four times a day (QID) | RESPIRATORY_TRACT | 3 refills | Status: DC | PRN

## 2021-02-27 MED ORDER — BUDESONIDE 0.25 MG/2ML IN SUSP: 0.2500 mg | Freq: Two times a day (BID) | RESPIRATORY_TRACT | 12 refills | Status: DC

## 2021-02-27 MED ORDER — LANTUS SOLOSTAR 100 UNIT/ML ~~LOC~~ SOPN: PEN_INJECTOR | SUBCUTANEOUS | 0 refills | Status: DC

## 2021-02-27 MED ORDER — LOSARTAN POTASSIUM 50 MG PO TABS
50.0000 mg | ORAL_TABLET | Freq: Every day | ORAL | 2 refills | Status: DC
Start: 2021-02-27 — End: 2021-05-01

## 2021-02-27 MED ORDER — GABAPENTIN 100 MG PO CAPS: 300.0000 mg | ORAL_CAPSULE | Freq: Two times a day (BID) | ORAL | 2 refills | Status: DC

## 2021-02-27 MED ORDER — ALBUTEROL SULFATE HFA 108 (90 BASE) MCG/ACT IN AERS: 2.0000 | INHALATION_SPRAY | Freq: Four times a day (QID) | RESPIRATORY_TRACT | 2 refills | Status: DC | PRN

## 2021-02-27 MED ORDER — GABAPENTIN 100 MG PO CAPS: ORAL_CAPSULE | ORAL | 0 refills | Status: DC

## 2021-02-27 NOTE — Assessment & Plan Note (Signed)
A1c improved from prior but not at goal.  Will increase Lantus to 22 units twice daily.  She may be a candidate for GLP-1 if her GI issues resolved.

## 2021-02-27 NOTE — Assessment & Plan Note (Signed)
Chronic lower back pain radiating down bilateral legs in the setting of spinal stenosis demonstrated on previous MRI.  No red flag symptoms.  Contact information for [facility] given to reestablish, can place new referral if necessary.  Will increase gabapentin to 300 mg twice daily in the meantime.

## 2021-02-27 NOTE — Assessment & Plan Note (Signed)
Not at goal after re-check. Increase losartan to 50 mg. Continue amlodipine and carvedilol. Advised to keep log of BP readings. - return in 2 weeks for nurse visit for BP check - BMP in 2 weeks, future orders placed

## 2021-02-27 NOTE — Patient Instructions (Addendum)
It was nice seeing you today!  Increase your losartan to 50 mg.  Increase your insulin to 22 units twice a day.  Increase your gabapentin to 300 mg (3 tablets) twice a day.  Call [facility] for appointment. [phone] [address], [place], [place] [address]  Call us if you need another referral.  Schedule a nurse visit and lab visit in 2 weeks for blood pressure check and labs.  Please arrive at least 15 minutes prior to your scheduled appointments.  Stay well, Zola Button, MD [place] [phone]

## 2021-02-28 ENCOUNTER — Other Ambulatory Visit: Payer: Self-pay

## 2021-02-28 MED ORDER — GABAPENTIN 100 MG PO CAPS: 300.0000 mg | ORAL_CAPSULE | Freq: Two times a day (BID) | ORAL | 2 refills | Status: DC

## 2021-03-12 ENCOUNTER — Ambulatory Visit: Payer: Medicare HMO

## 2021-03-22 ENCOUNTER — Other Ambulatory Visit: Payer: Self-pay

## 2021-03-22 ENCOUNTER — Ambulatory Visit: Payer: Medicare HMO

## 2021-03-22 VITALS — BP 134/78 | HR 76

## 2021-03-22 DIAGNOSIS — I10 Essential (primary) hypertension: Secondary | ICD-10-CM

## 2021-03-22 DIAGNOSIS — R6889 Other general symptoms and signs: Secondary | ICD-10-CM | POA: Diagnosis not present

## 2021-03-22 NOTE — Progress Notes (Signed)
Patient here today for BP check.      Last BP was on 02/27/2021 and was 156/78.  BP today is 134/78 with a pulse of 76.    Checked BP in left arm with large cuff.    Symptoms present: None.   Patient last took BP med this morning. Amlodipine, Carvedilol and Losartan 50mg .  Routed note to PCP.      Apt scheduled with PCP on 12/14 to FU with blood pressure and fatigue.

## 2021-03-25 ENCOUNTER — Other Ambulatory Visit: Payer: Self-pay

## 2021-03-25 DIAGNOSIS — E1142 Type 2 diabetes mellitus with diabetic polyneuropathy: Secondary | ICD-10-CM

## 2021-03-25 MED ORDER — LANTUS SOLOSTAR 100 UNIT/ML ~~LOC~~ SOPN: PEN_INJECTOR | SUBCUTANEOUS | 0 refills | Status: DC

## 2021-03-28 ENCOUNTER — Ambulatory Visit: Payer: Medicare HMO | Admitting: Gastroenterology

## 2021-03-28 ENCOUNTER — Encounter: Payer: Self-pay | Admitting: Gastroenterology

## 2021-03-28 VITALS — BP 162/74 | HR 83 | Ht 63.0 in | Wt 212.1 lb

## 2021-03-28 DIAGNOSIS — G8929 Other chronic pain: Secondary | ICD-10-CM | POA: Diagnosis not present

## 2021-03-28 DIAGNOSIS — R109 Unspecified abdominal pain: Secondary | ICD-10-CM | POA: Diagnosis not present

## 2021-03-28 DIAGNOSIS — R1310 Dysphagia, unspecified: Secondary | ICD-10-CM | POA: Diagnosis not present

## 2021-03-28 DIAGNOSIS — K5900 Constipation, unspecified: Secondary | ICD-10-CM | POA: Diagnosis not present

## 2021-03-28 DIAGNOSIS — K3184 Gastroparesis: Secondary | ICD-10-CM | POA: Diagnosis not present

## 2021-03-28 DIAGNOSIS — K219 Gastro-esophageal reflux disease without esophagitis: Secondary | ICD-10-CM

## 2021-03-28 DIAGNOSIS — D132 Benign neoplasm of duodenum: Secondary | ICD-10-CM

## 2021-03-28 DIAGNOSIS — R6889 Other general symptoms and signs: Secondary | ICD-10-CM | POA: Diagnosis not present

## 2021-03-28 MED ORDER — POLYETHYLENE GLYCOL 3350 17 G PO PACK: 17.0000 g | PACK | Freq: Two times a day (BID) | ORAL | 0 refills | Status: DC

## 2021-03-28 MED ORDER — PANTOPRAZOLE SODIUM 40 MG PO TBEC: 40.0000 mg | DELAYED_RELEASE_TABLET | Freq: Two times a day (BID) | ORAL | 1 refills | Status: DC

## 2021-03-28 MED ORDER — METOCLOPRAMIDE HCL 5 MG PO TABS: 5.0000 mg | ORAL_TABLET | Freq: Three times a day (TID) | ORAL | 2 refills | Status: DC

## 2021-03-28 MED ORDER — DICYCLOMINE HCL 10 MG PO CAPS: 10.0000 mg | ORAL_CAPSULE | Freq: Three times a day (TID) | ORAL | 1 refills | Status: DC | PRN
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
## 2021-03-28 NOTE — Patient Instructions (Addendum)
If you are age 72 or older, your body mass index should be between 23-30. Your Body mass index is 37.58 kg/m. If this is out of the aforementioned range listed, please consider follow up with your Primary Care Provider.  If you are age 79 or younger, your body mass index should be between 19-25. Your Body mass index is 37.58 kg/m. If this is out of the aformentioned range listed, please consider follow up with your Primary Care Provider.   ________________________________________________________  The [facility] providers would like to encourage you to use [facility] to communicate with providers for non-urgent requests or questions.  Due to long hold times on the telephone, sending your provider a message by [facility] may be a faster and more efficient way to get a response.  Please allow 48 business hours for a response.  Please remember that this is for non-urgent requests.  _______________________________________________________  Dennis Bast have been scheduled for an endoscopy. Please follow written instructions given to you at your visit today. If you use inhalers (even only as needed), please bring them with you on the day of your procedure.  Increase Protonix to twice a day  We have sent the following medications to your pharmacy for you to pick up at your convenience: Reglan 5 mg: Take three times a day Bentyl 10 mg: Take every 8 hours as needed for abdominal pain  Please purchase the following medications over the counter and take as directed: Miralax: Take twice a day and titrate as needed  Thank you for entrusting me with your care and for choosing [employer], Dr. Martin Cellar

## 2021-04-01 NOTE — Progress Notes (Incomplete)
° ° °  SUBJECTIVE:   CHIEF COMPLAINT / HPI:   Fatigue   PERTINENT  PMH / PSH: CAD, HFpEF, HTN, COPD, T2DM  OBJECTIVE:   There were no vitals taken for this visit.  General: ***, NAD CV: RRR, no murmurs*** Pulm: CTAB, no wheezes or rales  Wt Readings from Last 3 Encounters:  03/28/21 212 lb 2 oz (96.2 kg)  02/27/21 213 lb (96.6 kg)  12/05/20 206 lb 9.6 oz (93.7 kg)     ASSESSMENT/PLAN:   No problem-specific Assessment & Plan notes found for this encounter.     [name] Button, MD [place]   {    This will disappear when note is signed, click to select method of visit    :1}

## 2021-04-03 ENCOUNTER — Ambulatory Visit: Payer: Medicare HMO | Admitting: Family Medicine

## 2021-04-08 NOTE — Progress Notes (Deleted)
° ° °  SUBJECTIVE:   CHIEF COMPLAINT / HPI: f/u HTN, fatigue  At last visit 1 month ago, BP was not controlled, losartan was increased to 50 mg. Also on amlodipine 10 mg and carvedilol 25 mg BID. Repeat BP at RN visit 3 weeks later: 134/78.  Fatigue   PERTINENT  PMH / PSH: Spinal stenosis, T2DM, HTN, COPD, HFpEF, CAD  OBJECTIVE:   There were no vitals taken for this visit.  General: ***, NAD CV: RRR, no murmurs*** Pulm: CTAB, no wheezes or rales  Wt Readings from Last 3 Encounters:  03/28/21 212 lb 2 oz (96.2 kg)  02/27/21 213 lb (96.6 kg)  12/05/20 206 lb 9.6 oz (93.7 kg)     ASSESSMENT/PLAN:   No problem-specific Assessment & Plan notes found for this encounter.     [name] Button, MD [place]   {    This will disappear when note is signed, click to select method of visit    :1}

## 2021-04-10 ENCOUNTER — Ambulatory Visit: Payer: Medicare HMO | Admitting: Family Medicine

## 2021-04-18 ENCOUNTER — Ambulatory Visit (INDEPENDENT_AMBULATORY_CARE_PROVIDER_SITE_OTHER): Payer: Medicare HMO | Admitting: Family Medicine

## 2021-04-18 ENCOUNTER — Other Ambulatory Visit: Payer: Self-pay

## 2021-04-18 ENCOUNTER — Encounter: Payer: Self-pay | Admitting: Family Medicine

## 2021-04-18 VITALS — BP 131/50 | HR 88 | Temp 98.6°F | Wt 214.0 lb

## 2021-04-18 DIAGNOSIS — B9789 Other viral agents as the cause of diseases classified elsewhere: Secondary | ICD-10-CM | POA: Diagnosis not present

## 2021-04-18 DIAGNOSIS — J019 Acute sinusitis, unspecified: Secondary | ICD-10-CM | POA: Diagnosis not present

## 2021-04-18 DIAGNOSIS — R6889 Other general symptoms and signs: Secondary | ICD-10-CM | POA: Diagnosis not present

## 2021-04-18 DIAGNOSIS — R051 Acute cough: Secondary | ICD-10-CM | POA: Diagnosis not present

## 2021-04-18 DIAGNOSIS — I10 Essential (primary) hypertension: Secondary | ICD-10-CM

## 2021-04-18 DIAGNOSIS — R61 Generalized hyperhidrosis: Secondary | ICD-10-CM

## 2021-04-18 LAB — POCT INFLUENZA A/B
Influenza A, POC: NEGATIVE
Influenza B, POC: NEGATIVE

## 2021-04-18 NOTE — Progress Notes (Signed)
° ° °  SUBJECTIVE:   CHIEF COMPLAINT / HPI: fatigue  Patient states she has been feeling ill for the past 3 days.  Reports productive cough with greenish sputum (more than baseline), congestion, sinus pressure, sweats, chills.  Also has had diarrhea, 2 episodes yesterday.  Also lightheadedness when standing up.  She has been using her albuterol slightly more, 3 times a day instead of 2.  She has had ongoing issues with abdominal pain and constipation, recently saw GI earlier this month.  She is mainly concerned about her sinuses, believes she has a sinus infection and is asking about antibiotics.  At last visit over 1 month ago, BP was not controlled, losartan was increased to 50 mg. Also on amlodipine 10 mg and carvedilol 25 mg BID. Repeat BP at RN visit 3 weeks later: 134/78.   PERTINENT  PMH / PSH: Spinal stenosis, T2DM, HTN, COPD, HFpEF, CAD  OBJECTIVE:   BP (!) 131/50    Pulse 88    Temp 98.6 F (37 C) (Oral)    Wt 214 lb (97.1 kg)    SpO2 98%    BMI 37.91 kg/m   General: Tired appearing elderly female, NAD HEENT: Right maxillary sinus tenderness, posterior oropharynx clear, mucous membranes somewhat dry CV: RRR, no murmurs Pulm: CTAB, no wheezes or rales, no increased work of breathing Ext: Trace edema  ASSESSMENT/PLAN:   Viral illness Flu negative, COVID pending.  Suspect underlying viral illness based on symptoms.  She has been using more of her albuterol but lungs are clear and no respiratory distress on exam today to suggest need for antibiotics for treatment of COPD exacerbation.  Advised that she can call our office if not feeling better in 1 week, could treatment for bacterial sinus infection with antibiotics at that time if not improved.  Recommended supportive care with Flonase and Nettie pot.  Essential hypertension We will check BMP given recent increase in losartan.  Given low diastolic BP and lightheadedness upon standing, advised to hold losartan until she feels  better.    Scheduled for [facility] clinic 1/11  Zola Button, MD [place]

## 2021-04-18 NOTE — Patient Instructions (Addendum)
It was nice seeing you today!  Stop taking the losartan for now until you feel better.  For congestion, try Flonase and/or Neti pot.  If you are still feeling sick in 1 week, please give me a call and I can send you some antibiotics.  Call [facility] for appointment. [phone] [address], [place], [place] [address]  Next appointment January 11th at 11:00 AM. Make sure to bring all your medications with you.  Please arrive at least 15 minutes prior to your scheduled appointments.  Stay well, Zola Button, MD [place] [phone]

## 2021-04-18 NOTE — Assessment & Plan Note (Signed)
We will check BMP given recent increase in losartan.  Given low diastolic BP and lightheadedness upon standing, advised to hold losartan until she feels better.

## 2021-04-19 ENCOUNTER — Telehealth: Payer: Self-pay | Admitting: Family Medicine

## 2021-04-19 LAB — BASIC METABOLIC PANEL WITH GFR
BUN/Creatinine Ratio: 13 (ref 12–28)
BUN: 14 mg/dL (ref 8–27)
CO2: 23 mmol/L (ref 20–29)
Calcium: 9.4 mg/dL (ref 8.7–10.3)
Chloride: 99 mmol/L (ref 96–106)
Creatinine, Ser: 1.1 mg/dL — ABNORMAL HIGH (ref 0.57–1.00)
Glucose: 178 mg/dL — ABNORMAL HIGH (ref 70–99)
Potassium: 4.1 mmol/L (ref 3.5–5.2)
Sodium: 138 mmol/L (ref 134–144)
eGFR: 53 mL/min/1.73 — ABNORMAL LOW

## 2021-04-19 LAB — NOVEL CORONAVIRUS, NAA: SARS-CoV-2, NAA: NOT DETECTED

## 2021-04-19 LAB — SARS-COV-2, NAA 2 DAY TAT

## 2021-04-19 NOTE — Telephone Encounter (Signed)
Call patient to discuss lab results but was unable to reach patient.  If she calls back, please let her know that her COVID test was negative and also advise her not to restart the losartan until our upcoming appointment.  She should have already stopped the losartan.

## 2021-04-23 NOTE — Telephone Encounter (Signed)
Call patient to inform of negative COVID test.  Advised to hold losartan until next visit we will check labs.  She is still feeling congested, asked about antibiotics.  Advised to wait until Thursday in 2 days, will send in antibiotics and if she is still not feeling better.

## 2021-04-25 ENCOUNTER — Telehealth: Payer: Self-pay

## 2021-04-25 MED ORDER — AMOXICILLIN-POT CLAVULANATE 875-125 MG PO TABS: 1.0000 | ORAL_TABLET | Freq: Two times a day (BID) | ORAL | 0 refills | Status: AC

## 2021-04-25 NOTE — Telephone Encounter (Signed)
Patient returns call to nurse line. Patient reports that she has not had any improvement in symptoms. Patient is requesting that antibiotics be sent into [facility] on [address].   Talbot Grumbling, RN

## 2021-04-25 NOTE — Telephone Encounter (Signed)
Opened in error.   Loomis Anacker C Gemini Beaumier, RN  

## 2021-04-25 NOTE — Telephone Encounter (Signed)
Antibiotics sent to pharmacy for treatment of bacterial sinusitis.

## 2021-04-25 NOTE — Telephone Encounter (Signed)
I am not at a computer right now. If you are able to send in amoxillin-clavulanate 875 mg/125 mg twice daily for 5 days on my behalf, I would appreciate that. Otherwise I will send it in this evening.

## 2021-04-25 NOTE — Addendum Note (Signed)
Addended by: Zola Button D on: 04/25/2021 07:04 PM   Modules accepted: Orders

## 2021-04-26 DIAGNOSIS — R3915 Urgency of urination: Secondary | ICD-10-CM | POA: Diagnosis not present

## 2021-04-26 DIAGNOSIS — N3010 Interstitial cystitis (chronic) without hematuria: Secondary | ICD-10-CM | POA: Diagnosis not present

## 2021-04-30 NOTE — Progress Notes (Signed)
° ° °  SUBJECTIVE:   CHIEF COMPLAINT / HPI: TopC  Recently treated for bacterial sinusitis given URI symptoms greater than 10 days.  Advised to hold losartan until follow-up due to elevated serum creatinine.  She has upcoming EGD and also has an appointment with urology in the next two months.  Medications were reviewed in detail with patient.  Short term goals for today - right sided abdominal pain: this has been chronic, usually feels on the right side - sinus congestion, pain, ear itching: ear itching is chronic, worse in the right ear; almost finished with antibiotics for bacterial sinusitis, feeling slightly improved  Long term goals By next year, she is hoping to walk longer distances without becoming short of breath. Overall wants to be healthier. She has an exercise bike at home. Used to go to [employer] that she lives near, she is hoping to start going there again.  Strengths She is active in [employer] and identifies that as a strength. Family is also important to her.  HTN BP has been mostly in the 130s per patient. Has been holding the losartan as instructed.  PERTINENT  PMH / PSH: CAD, HTN, COPD, GERD, T2DM, HLD, spinal stenosis  OBJECTIVE:   BP (!) 130/59    Pulse 80    Ht 5\' 3"  (1.6 m)    Wt 213 lb (96.6 kg)    SpO2 98%    BMI 37.73 kg/m   General: alert, NAD HEENT: bilateral maxillary sinus tenderness, no frontal sinus tenderness, left TM obstructed with cerumen, right TM appears clear with normal canal CV: RRR, no murmurs Pulm: CTAB, no wheezes or rales Abd: soft, nontender Derm: underneath the pannus there is a scaly rash  ASSESSMENT/PLAN:   Essential hypertension BP at goal even off of losartan. Plan to update BMP today given recent elevation in creatinine but patient unable to due to time constraints. Will plan to check next time, future orders placed. - continue holding losartan, would like to restart at lower dose if BP tolerates at next visit - BMP at  follow-up - continue carvedilol, amlodipine   Intertrigo - terbinafine cream  Ear itching - recommended topical hydrocortisone OTC Q-tip application as needed  Medications reviewed and adjusted: - quetiapine: asking for refill for sleep, last prescribed years ago; will discontinue, recommend melatonin - budesonide nebulizer: needs new machine, not sure why she needs nebulizer, she has been using Pulmicort twice daily and albuterol inhaler and/or nebulizer 2-3 times/day; start Symbicort (LABA/ICS), hopeful to decrease albuterol use - clobetasol ointment refilled for toenails - hydroxyzine refilled for pruritis, using about 2-3 times/week - Flonase refilled; she had run out, this should help with her sinus symptoms - 4 mm insulin pen needles refilled (larger size too big) - discontinued Zofran (already on metaclopramide)  Will follow-up with her exercise goals  F/u 1 month for T2DM  Zola Button, MD [place]

## 2021-04-30 NOTE — Patient Instructions (Addendum)
It was nice seeing you today!  Apply Lamisil cream under your belly twice a day.  Stop the losartan.  Try applying hydrocortisone cream or ointment in your ears with a Q-tip to help with itching.  Please arrive at least 15 minutes prior to your scheduled appointments.  Stay well, [name] Button, MD [place] [phone]

## 2021-05-01 ENCOUNTER — Encounter: Payer: Self-pay | Admitting: Family Medicine

## 2021-05-01 ENCOUNTER — Other Ambulatory Visit: Payer: Self-pay

## 2021-05-01 ENCOUNTER — Ambulatory Visit (INDEPENDENT_AMBULATORY_CARE_PROVIDER_SITE_OTHER): Payer: Medicare HMO | Admitting: Family Medicine

## 2021-05-01 VITALS — BP 130/59 | HR 80 | Ht 63.0 in | Wt 213.0 lb

## 2021-05-01 DIAGNOSIS — I509 Heart failure, unspecified: Secondary | ICD-10-CM | POA: Diagnosis not present

## 2021-05-01 DIAGNOSIS — B372 Candidiasis of skin and nail: Secondary | ICD-10-CM

## 2021-05-01 DIAGNOSIS — I10 Essential (primary) hypertension: Secondary | ICD-10-CM

## 2021-05-01 DIAGNOSIS — L301 Dyshidrosis [pompholyx]: Secondary | ICD-10-CM | POA: Diagnosis not present

## 2021-05-01 DIAGNOSIS — I110 Hypertensive heart disease with heart failure: Secondary | ICD-10-CM | POA: Diagnosis not present

## 2021-05-01 DIAGNOSIS — J4489 Other specified chronic obstructive pulmonary disease: Secondary | ICD-10-CM

## 2021-05-01 DIAGNOSIS — R6889 Other general symptoms and signs: Secondary | ICD-10-CM | POA: Diagnosis not present

## 2021-05-01 DIAGNOSIS — J302 Other seasonal allergic rhinitis: Secondary | ICD-10-CM | POA: Diagnosis not present

## 2021-05-01 DIAGNOSIS — J449 Chronic obstructive pulmonary disease, unspecified: Secondary | ICD-10-CM | POA: Diagnosis not present

## 2021-05-01 MED ORDER — FLUTICASONE PROPIONATE 50 MCG/ACT NA SUSP: 2.0000 | Freq: Every day | NASAL | 6 refills | Status: DC

## 2021-05-01 MED ORDER — ALBUTEROL SULFATE HFA 108 (90 BASE) MCG/ACT IN AERS: 2.0000 | INHALATION_SPRAY | Freq: Four times a day (QID) | RESPIRATORY_TRACT | 2 refills | Status: DC | PRN

## 2021-05-01 MED ORDER — BUDESONIDE-FORMOTEROL FUMARATE 80-4.5 MCG/ACT IN AERO: 2.0000 | INHALATION_SPRAY | Freq: Two times a day (BID) | RESPIRATORY_TRACT | 3 refills | Status: DC

## 2021-05-01 MED ORDER — NYSTATIN 100000 UNIT/GM EX POWD: Freq: Three times a day (TID) | CUTANEOUS | 3 refills | Status: DC

## 2021-05-01 MED ORDER — INSULIN PEN NEEDLE 32G X 4 MM MISC: 3 refills | Status: DC

## 2021-05-01 MED ORDER — HYDROXYZINE HCL 10 MG PO TABS: 10.0000 mg | ORAL_TABLET | Freq: Three times a day (TID) | ORAL | 0 refills | Status: DC | PRN

## 2021-05-01 MED ORDER — TERBINAFINE HCL 1 % EX CREA: 1.0000 "application " | TOPICAL_CREAM | Freq: Two times a day (BID) | CUTANEOUS | 0 refills | Status: DC

## 2021-05-01 MED ORDER — CLOBETASOL PROPIONATE 0.05 % EX OINT: 1.0000 "application " | TOPICAL_OINTMENT | Freq: Two times a day (BID) | CUTANEOUS | 3 refills | Status: DC

## 2021-05-02 NOTE — Assessment & Plan Note (Signed)
BP at goal even off of losartan. Plan to update BMP today given recent elevation in creatinine but patient unable to due to time constraints. Will plan to check next time, future orders placed. - continue holding losartan, would like to restart at lower dose if BP tolerates at next visit - BMP at follow-up - continue carvedilol, amlodipine

## 2021-05-15 ENCOUNTER — Telehealth: Payer: Self-pay

## 2021-05-15 ENCOUNTER — Ambulatory Visit (AMBULATORY_SURGERY_CENTER): Payer: Medicare HMO | Admitting: Gastroenterology

## 2021-05-15 ENCOUNTER — Other Ambulatory Visit: Payer: Self-pay

## 2021-05-15 ENCOUNTER — Encounter: Payer: Self-pay | Admitting: Gastroenterology

## 2021-05-15 VITALS — BP 130/51 | HR 83 | Temp 97.1°F | Resp 22 | Ht 63.0 in | Wt 212.0 lb

## 2021-05-15 DIAGNOSIS — K209 Esophagitis, unspecified without bleeding: Secondary | ICD-10-CM

## 2021-05-15 DIAGNOSIS — K449 Diaphragmatic hernia without obstruction or gangrene: Secondary | ICD-10-CM

## 2021-05-15 DIAGNOSIS — R1310 Dysphagia, unspecified: Secondary | ICD-10-CM

## 2021-05-15 DIAGNOSIS — K319 Disease of stomach and duodenum, unspecified: Secondary | ICD-10-CM | POA: Diagnosis not present

## 2021-05-15 DIAGNOSIS — K2100 Gastro-esophageal reflux disease with esophagitis, without bleeding: Secondary | ICD-10-CM | POA: Diagnosis not present

## 2021-05-15 DIAGNOSIS — D132 Benign neoplasm of duodenum: Secondary | ICD-10-CM

## 2021-05-15 DIAGNOSIS — K219 Gastro-esophageal reflux disease without esophagitis: Secondary | ICD-10-CM | POA: Diagnosis not present

## 2021-05-15 DIAGNOSIS — G8929 Other chronic pain: Secondary | ICD-10-CM

## 2021-05-15 MED ORDER — PANTOPRAZOLE SODIUM 40 MG PO TBEC: 40.0000 mg | DELAYED_RELEASE_TABLET | Freq: Every day | ORAL | 3 refills | Status: DC

## 2021-05-15 MED ORDER — SODIUM CHLORIDE 0.9 % IV SOLN: 500.0000 mL | Freq: Once | INTRAVENOUS | Status: DC

## 2021-05-15 NOTE — Progress Notes (Signed)
Pt non-responsive, VVS, Report to RN  °

## 2021-05-15 NOTE — Op Note (Signed)
[facility] Patient Name: Keneshia Tena Procedure Date: 05/15/2021 9:49 AM MRN: [id number] Endoscopist: Remo Lipps P. Theotis Gerdeman , MD Age: 71 Referring MD:  Date of Birth: 1951-02-12 Gender: Female Account #: [id number] Procedure:                Upper GI endoscopy Indications:              Dysphagia- empiric dilation has helped in the past,                            history of GERD / gastroparesis - recently                            increased protonix to twice daily and added Reglan                            with some improvement but symptoms largely persist,                            history of duodenal adenoma in 2021 Medicines:                Monitored Anesthesia Care Procedure:                Pre-Anesthesia Assessment:                           - Prior to the procedure, a History and Physical                            was performed, and patient medications and                            allergies were reviewed. The patient's tolerance of                            previous anesthesia was also reviewed. The risks                            and benefits of the procedure and the sedation                            options and risks were discussed with the patient.                            All questions were answered, and informed consent                            was obtained. Prior Anticoagulants: The patient has                            taken no previous anticoagulant or antiplatelet                            agents. ASA Grade Assessment: III - A patient with  severe systemic disease. After reviewing the risks                            and benefits, the patient was deemed in                            satisfactory condition to undergo the procedure.                           After obtaining informed consent, the endoscope was                            passed under direct vision. Throughout the                            procedure, the  patient's blood pressure, pulse, and                            oxygen saturations were monitored continuously. The                            Olympus [id number] was introduced through                            the mouth, and advanced to the second part of                            duodenum. The upper GI endoscopy was accomplished                            without difficulty. The patient tolerated the                            procedure well. Scope In: Scope Out: Findings:                 Esophagogastric landmarks were identified: the                            Z-line was found at 36 cm, the gastroesophageal                            junction was found at 36 cm and the upper extent of                            the gastric folds was found at 37 cm from the                            incisors.                           A 1 cm hiatal hernia was present.                           LA Grade A esophagitis  was found at the GEJ.                           There was a benign gastric inlet patch in the                            proximal esophagus. The exam of the esophagus was                            otherwise normal.                           A guidewire was placed and the scope was withdrawn.                            Empiric dilation was performed in the entire                            esophagus with a Savary dilator with mild                            resistance at 17 mm and 18 mm. Relook endoscopy                            showed no mucosal wrents.                           The entire examined stomach was normal. Biopsies                            were taken with a cold forceps for Helicobacter                            pylori testing given the patient's ongoing symptoms.                           The duodenal bulb, second portion of the duodenum                            and third portion of the duodenum were normal. NO                             polyps. Complications:            No immediate complications. Estimated blood loss:                            Minimal. Estimated Blood Loss:     Estimated blood loss was minimal. Impression:               - Esophagogastric landmarks identified.                           - 1 cm hiatal hernia.                           -  LA Grade A esophagitis.                           - Benign gastric inlet patch in proximal esophagus.                           - Normal esophagus otherwise. Empiric dilation                            performed to 75mm.                           - Normal stomach. Biopsied.                           - Normal duodenal bulb, second portion of the                            duodenum and third portion of the duodenum. Recommendation:           - Patient has a contact number available for                            emergencies. The signs and symptoms of potential                            delayed complications were discussed with the                            patient. Return to normal activities tomorrow.                            Written discharge instructions were provided to the                            patient.                           - Resume previous diet.                           - Continue present medications including protonix                            twice daily                           - Can try adding FD gard as needed for dyspepsia.                            Will discuss slight dose escalation of Reglan                            (currently on 5mg  TID)                           - Await pathology  results and course post dilation. Remo Lipps P. Katee Wentland, MD 05/15/2021 10:19:39 AM This report has been signed electronically.

## 2021-05-15 NOTE — Telephone Encounter (Signed)
Patient LVM on nurse line reporting difficulties picking up Lamisil and Temovate at the pharmacy.   I called the pharmacy and Lamisil is OTC. Pharmacy reports they will help her locate this on the shelf. Temovate is not covered by her insurance. Pharmacy suggested using a goodrx coupon.   I called patient to inform of this, however her granddaughter answered. Granddaughter reports she is familiar with goodrx and will help her obtain prescription if affordable.   Granddaughter appreciative for help.

## 2021-05-15 NOTE — Progress Notes (Signed)
Called to room to assist during endoscopic procedure.  Patient ID and intended procedure confirmed with present staff. Received instructions for my participation in the procedure from the performing physician.  
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
## 2021-05-15 NOTE — Patient Instructions (Signed)
Handout on esophagitis and stricture given, handout on gastritis given.  RX sent to pharmacy for Pantoprazole 40mg  BID.  YOU HAD AN ENDOSCOPIC PROCEDURE TODAY AT [place]:   Refer to the procedure report that was given to you for any specific questions about what was found during the examination.  If the procedure report does not answer your questions, please call your gastroenterologist to clarify.  If you requested that your care partner not be given the details of your procedure findings, then the procedure report has been included in a sealed envelope for you to review at your convenience later.  YOU SHOULD EXPECT: Some feelings of bloating in the abdomen. Passage of more gas than usual.  Walking can help get rid of the air that was put into your GI tract during the procedure and reduce the bloating. If you had a lower endoscopy (such as a colonoscopy or flexible sigmoidoscopy) you may notice spotting of blood in your stool or on the toilet paper. If you underwent a bowel prep for your procedure, you may not have a normal bowel movement for a few days.  Please Note:  You might notice some irritation and congestion in your nose or some drainage.  This is from the oxygen used during your procedure.  There is no need for concern and it should clear up in a day or so.  SYMPTOMS TO REPORT IMMEDIATELY:   Following upper endoscopy (EGD)  Vomiting of blood or coffee ground material  New chest pain or pain under the shoulder blades  Painful or persistently difficult swallowing  New shortness of breath  Fever of 100F or higher  Black, tarry-looking stools  For urgent or emergent issues, a gastroenterologist can be reached at any hour by calling [phone]. Do not use MyChart messaging for urgent concerns.    DIET:  We do recommend a small meal at first, but then you may proceed to your regular diet.  Drink plenty of fluids but you should avoid alcoholic beverages for 24  hours.  ACTIVITY:  You should plan to take it easy for the rest of today and you should NOT DRIVE or use heavy machinery until tomorrow (because of the sedation medicines used during the test).    FOLLOW UP: Our staff will call the number listed on your records 48-72 hours following your procedure to check on you and address any questions or concerns that you may have regarding the information given to you following your procedure. If we do not reach you, we will leave a message.  We will attempt to reach you two times.  During this call, we will ask if you have developed any symptoms of COVID 19. If you develop any symptoms (ie: fever, flu-like symptoms, shortness of breath, cough etc.) before then, please call [phone].  If you test positive for Covid 19 in the 2 weeks post procedure, please call and report this information to [place].    If any biopsies were taken you will be contacted by phone or by letter within the next 1-3 weeks.  Please call us at [phone] if you have not heard about the biopsies in 3 weeks.    SIGNATURES/CONFIDENTIALITY: You and/or your care partner have signed paperwork which will be entered into your electronic medical record.  These signatures attest to the fact that that the information above on your After Visit Summary has been reviewed and is understood.  Full responsibility of the confidentiality of this discharge information  lies with you and/or your care-partner.

## 2021-05-17 ENCOUNTER — Telehealth: Payer: Self-pay | Admitting: *Deleted

## 2021-05-17 DIAGNOSIS — K209 Esophagitis, unspecified without bleeding: Secondary | ICD-10-CM

## 2021-05-17 DIAGNOSIS — K219 Gastro-esophageal reflux disease without esophagitis: Secondary | ICD-10-CM

## 2021-05-17 NOTE — Telephone Encounter (Signed)
Chart reviewed.  Procedure went off without incident.  No trauma from dilation reported.  I agree with RN recommendations.  Regarding nausea we will wait for Dr. Havery Moros return next week and make any additional recommendations

## 2021-05-17 NOTE — Telephone Encounter (Signed)
°  Follow up Call-  Call back number 05/15/2021 08/15/2019 07/12/2019  Post procedure Call Back phone  # [phone]  Permission to leave phone message Yes Yes Yes  Some recent data might be hidden   [facility]

## 2021-05-17 NOTE — Telephone Encounter (Signed)
Thanks for the note and follow up Glendell Docker.  Agree her procedure was uneventful and no mucosal wrents post EGD. I reviewed pathology results from her EGD, can you let her know biopsies of her stomach are NEGATIVE for H pylori. She has chronic symptoms I think from gastroparesis.  She should be on Reglan TID, protonix BID, Zofran every 8 hours or so at baseline - can you verify she is taking that. I had also recommended starting FD gard as well to treat any component of dyspepsia, she can get that OTC. Thanks

## 2021-05-17 NOTE — Telephone Encounter (Signed)
°  Follow up Call-  Call back number 05/15/2021 08/15/2019 07/12/2019  Post procedure Call Back phone  # [phone]  Permission to leave phone message Yes Yes Yes  Some recent data might be hidden     Patient questions:  Do you have a fever, pain , or abdominal swelling? Yes.   Pain Score  2 *- pt reports mild soreness on the right side of there throat with swallowing. Dilation was performed and RN instructed pt that she may be experiencing soreness from the dilation/procedure.   Have you tolerated food without any problems? No.- pt states she continues to have persistent nausea since the procedure. She has frequent nausea r/t gastroparesis but she states "this feels different than normal, it doesn't ever go away." States she has only been able to drink water and ginger ale and only eating a small amount of applesauce and jello. Pt denies vomiting, only c/o nausea. Pt reports she is taking the Protonix and Reglan as prescribed by Dr. Havery Moros. Will notify MD of pt's complaints. Instructed pt that if there were any further recommendations that our office would call her back, otherwise she should continue to drink fluids as able to prevent dehydration, take her medications as prescribed, while we are awaiting pathology results. RN instructed pt that if symptoms worsen and she is not able to take fluids then she should seek care at an ED. Pt verbalized understanding.   Have you been able to return to your normal activities? No.  Do you have any questions about your discharge instructions: Diet   No. Medications  No. Follow up visit  No.  Do you have questions or concerns about your Care? Yes.    Actions: * If pain score is 4 or above: Physician/ provider Notified : Silvano Rusk, MD. Date 05/17/2021  at Time 11:43 AM .

## 2021-05-20 MED ORDER — PANTOPRAZOLE SODIUM 40 MG PO TBEC: 40.0000 mg | DELAYED_RELEASE_TABLET | Freq: Two times a day (BID) | ORAL | 3 refills | Status: DC

## 2021-05-20 MED ORDER — ONDANSETRON HCL 4 MG PO TABS: 4.0000 mg | ORAL_TABLET | Freq: Three times a day (TID) | ORAL | 2 refills | Status: DC | PRN

## 2021-05-20 NOTE — Addendum Note (Signed)
Addended by: Jess Barters L on: 05/20/2021 08:03 AM   Modules accepted: Orders

## 2021-05-20 NOTE — Telephone Encounter (Signed)
Called pt to discuss recommendations from Dr. Havery Moros. Called granddaughter's number as that is listed as the main contact number for this pt. No answer. Left detailed message regarding medication regimen and negative results from biopsies from EGD. Sent prescriptions for Protonix 40mg  BID and Zofran Q8 hour PRN to pharmacy on file. Previously the Protonix was ordered for daily and the Zofran was not listed on medication list. Also informed of trying OTC FD gard for symptoms. Instructed that if there are any further questions or if the pharmacy is not correct to please call back for changes to be made.

## 2021-06-14 DIAGNOSIS — N3010 Interstitial cystitis (chronic) without hematuria: Secondary | ICD-10-CM | POA: Diagnosis not present

## 2021-06-14 DIAGNOSIS — R6889 Other general symptoms and signs: Secondary | ICD-10-CM | POA: Diagnosis not present

## 2021-06-17 ENCOUNTER — Other Ambulatory Visit: Payer: Self-pay

## 2021-07-25 ENCOUNTER — Other Ambulatory Visit: Payer: Self-pay | Admitting: Family Medicine

## 2021-07-29 ENCOUNTER — Other Ambulatory Visit: Payer: Self-pay | Admitting: Family Medicine

## 2021-08-02 ENCOUNTER — Other Ambulatory Visit: Payer: Self-pay | Admitting: *Deleted

## 2021-08-02 ENCOUNTER — Other Ambulatory Visit: Payer: Self-pay | Admitting: Family Medicine

## 2021-08-02 DIAGNOSIS — B372 Candidiasis of skin and nail: Secondary | ICD-10-CM

## 2021-08-02 DIAGNOSIS — Z1231 Encounter for screening mammogram for malignant neoplasm of breast: Secondary | ICD-10-CM

## 2021-08-02 DIAGNOSIS — L299 Pruritus, unspecified: Secondary | ICD-10-CM

## 2021-08-02 MED ORDER — CETIRIZINE HCL 10 MG PO TABS: 10.0000 mg | ORAL_TABLET | Freq: Every day | ORAL | 11 refills | Status: DC

## 2021-08-02 MED ORDER — CARVEDILOL 25 MG PO TABS: 25.0000 mg | ORAL_TABLET | Freq: Two times a day (BID) | ORAL | 5 refills | Status: DC

## 2021-08-02 MED ORDER — NYSTATIN 100000 UNIT/GM EX POWD: Freq: Three times a day (TID) | CUTANEOUS | 3 refills | Status: DC

## 2021-08-02 NOTE — Telephone Encounter (Signed)
Medications refilled and referral to breast clinic ordered per patient request, please let patient know. Thank you. ?

## 2021-08-02 NOTE — Telephone Encounter (Signed)
Patient called asking that a referral to the breast center. She was having some itching on her breast and under it, so she wasn't able to schedule her screening mammogram without an order from her PCP.  Patient is also requesting refills on the medications pended below.  She was offered an appt but only wanted to see PCP and didn't want to wait until the end of April.   ? ?If the patient needs: ? ? An appointment - route response to [facility] admin ? A callback from clinic staff - route response to your team ? ? ?Only route to RN Team: form completions or if RN team directly requests response sent to them ? ? ?Evonda Enge, CMA ? ?

## 2021-08-13 ENCOUNTER — Telehealth: Payer: Self-pay | Admitting: *Deleted

## 2021-08-13 NOTE — Chronic Care Management (AMB) (Signed)
?  Care Management  ? ?Outreach Note ? ?08/13/2021 ?Name: [name] [name] MRN: [id number] DOB: 04/21/1951 ? ?Referred by: Zola Button, MD ?Reason for referral : Care Coordination (Initial outreach to schedule with RNCM ) ? ? ?An unsuccessful telephone outreach was attempted today. The patient was referred to the case management team for assistance with care management and care coordination.  ? ?Follow Up Plan:  ?The care management team will reach out to the patient again over the next 7 days.  ?If patient returns call to provider office, please advise to call [place]* at [phone].* ? ?Laverda Sorenson  ?Care Guide, Embedded Care Coordination ?[place]  Care Management  ?Direct Dial: [phone] ? ?

## 2021-08-19 ENCOUNTER — Telehealth: Payer: Self-pay | Admitting: Physician Assistant

## 2021-08-19 ENCOUNTER — Ambulatory Visit: Payer: Medicare HMO | Admitting: Physician Assistant

## 2021-08-19 NOTE — Chronic Care Management (AMB) (Signed)
?  Care Management  ? ?Note ? ?08/19/2021 ?Name: Dung Prien MRN: [id number] DOB: 01-27-1951 ? ?Zalika Tieszen is a 71 y.o. year old female who is a primary care patient of Zola Button, MD. I reached out to [employer] by phone today offer care coordination services.  ? ?Ms. Strand was given information about care management services today including:  ?Care management services include personalized support from designated clinical staff supervised by her physician, including individualized plan of care and coordination with other care providers ?24/7 contact phone numbers for assistance for urgent and routine care needs. ?The patient may stop care management services at any time by phone call to the office staff. ? ?Patient agreed to services and verbal consent obtained.  ? ?Follow up plan: ?Telephone appointment with care management team member scheduled QJJ:HERD 08/21/21 and SW 09/03/21 ? ?Laverda Sorenson  ?Care Guide, Embedded Care Coordination ?[place]  Care Management  ?Direct Dial: [phone] ? ?

## 2021-08-19 NOTE — Telephone Encounter (Signed)
Good Morning [name] [name], ? ?Patient called to cancel appointment with you today at 9:00 due do not having transportation.  ? ?Patient was rescheduled for 5/25 at 10:00.  ?

## 2021-08-20 ENCOUNTER — Telehealth: Payer: Self-pay

## 2021-08-20 ENCOUNTER — Other Ambulatory Visit: Payer: Self-pay

## 2021-08-20 DIAGNOSIS — I25119 Atherosclerotic heart disease of native coronary artery with unspecified angina pectoris: Secondary | ICD-10-CM

## 2021-08-20 NOTE — Telephone Encounter (Signed)
? ?  Telephone encounter was:  Successful.  ?08/20/2021 ?Name: Neelam Tiggs MRN: [id number] DOB: Jun 06, 1950 ? ?Porchia Sinkler is a 71 y.o. year old female who is a primary care patient of Zola Button, MD . The community resource team was consulted for assistance with Transportation Needs  ? ?Care guide performed the following interventions: Patient provided with information about care guide support team and interviewed to confirm resource needs.Patient requested I call back 5/3 8:30 am ? ?Follow Up Plan:  Care guide will follow up with patient by phone over the next day ? ? ? ?Larena Sox ?Care Guide, Embedded Care Coordination ?[facility], Care Management  ?[phone] ?[address], [place], [place] [address] ?Phone: [phone] ?Email: [email or web address] ? ?  ?

## 2021-08-21 ENCOUNTER — Telehealth: Payer: Self-pay

## 2021-08-21 ENCOUNTER — Ambulatory Visit: Payer: Medicare HMO

## 2021-08-21 NOTE — Telephone Encounter (Signed)
? ?  Telephone encounter was:  Unsuccessful.  08/21/2021 ?Name: [name] [name] MRN: [id number] DOB: 1951-03-22 ? ?Unsuccessful outbound call made today to assist with:  Transportation Needs  ? ?Outreach Attempt:  2nd Attempt I called back at requested time from Patient  ? ?A HIPAA compliant voice message was left requesting a return call.  Instructed patient to call back at  earliest convenience. ? ?. ? ? ? ?Larena Sox ?Care Guide, Embedded Care Coordination ?[facility], Care Management  ?[phone] ?[address], [place], [place] [address] ?Phone: [phone] ?Email: [email or web address] ? ?  ?

## 2021-08-22 ENCOUNTER — Other Ambulatory Visit: Payer: Self-pay | Admitting: Family Medicine

## 2021-08-22 MED ORDER — ACCU-CHEK GUIDE W/DEVICE KIT: 1.0000 | PACK | Freq: Four times a day (QID) | 1 refills | Status: DC

## 2021-08-22 NOTE — Progress Notes (Deleted)
    SUBJECTIVE:   CHIEF COMPLAINT / HPI:  No chief complaint on file.   T2DM   HTN On amlodipine 10 mg, carvedilol 25 mg BID.   PERTINENT  PMH / PSH: ***  Patient Care Team: Zola Button, MD as PCP - General (Family Medicine) Debara Pickett Nadean Corwin, MD as PCP - Cardiology (Cardiology) [name], MD as Consulting Physician (Gastroenterology) Warden Fillers, MD as Consulting Physician (Ophthalmology) Lazaro Arms, RN as [facility] [place], Cleophus Molt as Social Worker   OBJECTIVE:   There were no vitals taken for this visit.  Physical Exam      05/01/2021   11:07 AM  Depression screen PHQ 2/9  Decreased Interest 1  Down, Depressed, Hopeless 1  PHQ - 2 Score 2  Altered sleeping 1  Tired, decreased energy 1  Change in appetite 1  Feeling bad or failure about yourself  0  Trouble concentrating 1  Moving slowly or fidgety/restless 0  Suicidal thoughts 0  PHQ-9 Score 6  Difficult doing work/chores Somewhat difficult     {Show previous vital signs (optional):23777}  {Labs  Heme  Chem  Endocrine  Serology  Results Review (optional):23779}  ASSESSMENT/PLAN:   No problem-specific Assessment & Plan notes found for this encounter.    No follow-ups on file.   Zola Button, MD [place]

## 2021-08-22 NOTE — Chronic Care Management (AMB) (Signed)
? Care Management ?  ? [name] Visit Note ? ?08/22/2021 ?Name: [name] [name] MRN: [id number] DOB: Sep 07, 1950 ? ?Subjective: ?[name] [name] is a 71 y.o. year old female who is a primary care patient of [name] [name]. The care management team was consulted for assistance with disease management and care coordination needs.   ? ?Engaged with patient by telephone for initial visit in response to provider referral for case management and/or care coordination services.  ? ?Consent to Services:  ? [name] [name] was given information about Care Management services today including:  ?Care Management services includes personalized support from designated clinical staff supervised by her physician, including individualized plan of care and coordination with other care providers ?24/7 contact phone numbers for assistance for urgent and routine care needs. ?The patient may stop case management services at any time by phone call to the office staff. ? ?Patient agreed to services and consent obtained.  ? ? ?Summary:  The patient is trying her best with checking her blood sugars and knows eating the wrong foods can elevate her numbers.  She also continues to smoke even though she has COPD but we will try to work on smoking cessation to help her quit. . See Care Plan below for interventions and patient self-care actives. ? ?Recommendation: The patient may benefit from taking medications as prescribed, monitoring food intake, watch fluid intake, checking Blood sugar and record values, COPD action plan, and The patient agrees. ? ?Follow up Plan: Patient would like continued follow-up.  CCM RNCM will outreach the patient within the next 2 weeks.  Patient will call office if needed prior to next encounter ? ? ?Assessment: Review of patient past medical history, allergies, medications, health status, including review of consultants reports, laboratory and other test data, was performed as part of comprehensive evaluation and provision of  chronic care management services.  ? ?SDOH (Social Determinants of Health) assessments and interventions performed:  No ? ? ?Conditions to be addressed/monitored: COPD and DMII ? ?Care Plan : [name] Case Manager  ?Updates made by [name] [name] since 08/22/2021 12:00 AM  ?  ? ?Problem: General Plan of Care in a patient with Diabetes andd COPD   ?  ? ?Long-Range Goal: The patiednt will learn skills tto monitor and manage her chroonice conditions to prevent admission to the hospital and keep her levels at a normal range.   ?Start Date: 08/21/2021  ?Expected End Date: 08/22/2022  ?Priority: High  ?Note:   ?Current Barriers:  ?Chronic Disease Management support and education needs related to COPD and DMII ? ?RNCM Clinical Goal(s):  ?Patient will verbalize basic understanding of COPD and DMII disease process and self health management plan as evidenced by preventing hospital admissions for 30 days and maintaining acceptable levels  through collaboration with [name] Care manager, provider, and care team.  ? ?Interventions: ?1:1 collaboration with primary care provider regarding development and update of comprehensive plan of care as evidenced by provider attestation and co-signature ?Inter-disciplinary care team collaboration (see longitudinal plan of care) ?Evaluation of current treatment plan related to  self management and patient's adherence to plan as established by provider ? ? ?COPD: (Status: New goal.) Long Term Goal  ?Reviewed medications with patient, including use of prescribed maintenance and rescue inhalers, and provided instruction on medication management and the importance of adherence ?Provided patient with basic written and verbal COPD education on self care/management/and exacerbation prevention ?Advised patient to track and manage COPD triggers ?Advised patient to self assesses COPD action  plan zone and make appointment with provider if in the yellow zone for 48 hours without improvement ?Provided education about  and advised patient to utilize infection prevention strategies to reduce risk of respiratory infection ?Discussed the importance of adequate rest and management of fatigue with COPD ? ?Diabetes:  (Status: New goal.) Long Term Goal  ? ?Lab Results  ?Component Value Date  ? HGBA1C 8.4 (A) 02/27/2021  ? @ ?Assessed patient's understanding of A1c goal: <7% ?Provided education to patient about basic DM disease process; ?Reviewed medications with patient and discussed importance of medication adherence;        ?Counseled on importance of regular laboratory monitoring as prescribed;        ?Discussed plans with patient for ongoing care management follow up and provided patient with direct contact information for care management team;      ?Referral made to social work team for assistance with Anxiety stress and depression. ?Message sent to PCP for new glucometer   ?Review of patient status, including review of consultants reports, relevant laboratory and other test results, and medications completed;     ?08/21/21:  I spoke with [name] [name] over the phone for our initial meeting, which was arranged through a referral. During our conversation, I introduced myself as a member of CCM and explained my responsibilities. [name] [name] and can perform her daily activities. I thoroughly reviewed his medical history, including allergies, medications, falls, and overall health status, and inquired about any social determinants of health. However, She had no additional needs. She did state that she has some issues with her blood sugars and breathing. She is not eating the correct diet, and she smokes. She needs a new glucometer. I will message her PCP to see if he can prescribe a new glucometer. She also stated that she has some problems with Stress, anxiety, and depression. She has an appointment scheduled with my co-worker [name] [name], [place]. I will send her educational information on Diabetes and COPD  to review. ? ? ?   ? ?Patient Goals/Self Care Activities: ?-Patient/Caregiver will self-administer medications as prescribed as evidenced by self-report/primary caregiver report  ?-Patient/Caregiver will attend all scheduled provider appointments as evidenced by clinician review of documented attendance to scheduled appointments and patient/caregiver report ?-Patient/Caregiver will call pharmacy for medication refills as evidenced by patient report and review of pharmacy fill history as appropriate ?-Patient/Caregiver will call provider office for new concerns or questions as evidenced by review of documented incoming telephone call notes and patient report ?-Patient/Caregiver verbalizes understanding of plan ?-Patient/Caregiver will focus on medication adherence by taking medications as prescribed  ?-check blood sugar at prescribed times ?-check blood sugar if I feel it is too high or too low ?-record values and write them down take them to all doctor visits   ?-Avoid smoke and air pollution ?-Keep your airway clear from mucus build up  ?-Control your cough by drinking plenty of water ?-Self assess COPD action plan zone and make appointment with provider if you have been in the yellow zone for 48 hours without improvement.  ?  ? ?  ? ?[name] Arms [name], BSN, [place] ?Care Management Coordinator ?[facility]  ?Phone: [phone]  ?  ? ? ? ? ? ? ? ? ? ?

## 2021-08-22 NOTE — Patient Instructions (Signed)
?[name] [name]  it was nice speaking with you. Please call me directly [phone] if you have questions about the goals we discussed. ? ? ? ? ?Patient Care Plan: RN Case Manager  ?  ? ?Problem Identified: General Plan of Care in a patient with Diabetes andd COPD   ?  ? ?Long-Range Goal: The patiednt will learn skills tto monitor and manage her chroonice conditions to prevent admission to the hospital and keep her levels at a normal range.   ?Start Date: 08/21/2021  ?Expected End Date: 08/22/2022  ?Priority: High  ?Note:   ?Current Barriers:  ?Chronic Disease Management support and education needs related to COPD and DMII ? ?RNCM Clinical Goal(s):  ?Patient will verbalize basic understanding of COPD and DMII disease process and self health management plan as evidenced by preventing hospital admissions for 30 days and maintaining acceptable levels  through collaboration with RN Care manager, provider, and care team.  ? ?Interventions: ?1:1 collaboration with primary care provider regarding development and update of comprehensive plan of care as evidenced by provider attestation and co-signature ?Inter-disciplinary care team collaboration (see longitudinal plan of care) ?Evaluation of current treatment plan related to  self management and patient's adherence to plan as established by provider ? ? ?COPD: (Status: New goal.) Long Term Goal  ?Reviewed medications with patient, including use of prescribed maintenance and rescue inhalers, and provided instruction on medication management and the importance of adherence ?Provided patient with basic written and verbal COPD education on self care/management/and exacerbation prevention ?Advised patient to track and manage COPD triggers ?Advised patient to self assesses COPD action plan zone and make appointment with provider if in the yellow zone for 48 hours without improvement ?Provided education about and advised patient to utilize infection prevention strategies to reduce risk  of respiratory infection ?Discussed the importance of adequate rest and management of fatigue with COPD ? ?Diabetes:  (Status: New goal.) Long Term Goal  ? ?Lab Results  ?Component Value Date  ? HGBA1C 8.4 (A) 02/27/2021  ? @ ?Assessed patient's understanding of A1c goal: <7% ?Provided education to patient about basic DM disease process; ?Reviewed medications with patient and discussed importance of medication adherence;        ?Counseled on importance of regular laboratory monitoring as prescribed;        ?Discussed plans with patient for ongoing care management follow up and provided patient with direct contact information for care management team;      ?Referral made to social work team for assistance with Anxiety stress and depression. ?Message sent to PCP for new glucometer   ?Review of patient status, including review of consultants reports, relevant laboratory and other test results, and medications completed;     ?08/21/21:  I spoke with [name] [name] over the phone for our initial meeting, which was arranged through a referral. During our conversation, I introduced myself as a member of CCM and explained my responsibilities. [name] [name] lives with her granddaughter alone and can perform her daily activities. I thoroughly reviewed his medical history, including allergies, medications, falls, and overall health status, and inquired about any social determinants of health. However, She had no additional needs. She did state that she has some issues with her blood sugars and breathing. She is not eating the correct diet, and she smokes. She needs a new glucometer. I will message her PCP to see if he can prescribe a new glucometer. She also stated that she has some problems with Stress, anxiety, and depression. She  has an appointment scheduled with my co-worker [name] [name], [place]. I will send her educational information on Diabetes and COPD to review. ? ? ?   ? ?Patient Goals/Self Care Activities: ?-Patient/Caregiver  will self-administer medications as prescribed as evidenced by self-report/primary caregiver report  ?-Patient/Caregiver will attend all scheduled provider appointments as evidenced by clinician review of documented attendance to scheduled appointments and patient/caregiver report ?-Patient/Caregiver will call pharmacy for medication refills as evidenced by patient report and review of pharmacy fill history as appropriate ?-Patient/Caregiver will call provider office for new concerns or questions as evidenced by review of documented incoming telephone call notes and patient report ?-Patient/Caregiver verbalizes understanding of plan ?-Patient/Caregiver will focus on medication adherence by taking medications as prescribed  ?-check blood sugar at prescribed times ?-check blood sugar if I feel it is too high or too low ?-record values and write them down take them to all doctor visits   ?-Avoid smoke and air pollution ?-Keep your airway clear from mucus build up  ?-Control your cough by drinking plenty of water ?-Self assess COPD action plan zone and make appointment with provider if you have been in the yellow zone for 48 hours without improvement.  ?  ? ?  ?  ? ?[name] received Care Management services today:  ?Care Management services include personalized support from designated clinical staff supervised by her physician, including individualized plan of care and coordination with other care providers ?24/7 contact [phone] for assistance for urgent and routine care needs. ?Care Management are voluntary services and be declined at any time by calling the office. ? ?The patient verbalized understanding of instructions provided today and agreed to receive a mailed copy of patient instruction and/or educational materials.  ? ?Follow Up Plan: Patient would like continued follow-up.  CCM RNCM will outreach the patient within the next 2 weeks  Patient will call office if needed prior to next encounter ? ? ?Lazaro Arms, RN  ? ?[phone]  ?

## 2021-08-22 NOTE — Patient Instructions (Incomplete)
It was nice seeing you today!  Blood work today.  See me in 3 months or whenever is a good for you.  Stay well, Tyrik Stetzer, MD [facility] [phone]  --  Make sure to check out at the front desk before you leave today.  Please arrive at least 15 minutes prior to your scheduled appointments.  If you had blood work today, I will send you a MyChart message or a letter if results are normal. Otherwise, I will give you a call.  If you had a referral placed, they will call you to set up an appointment. Please give us a call if you don't hear back in the next 2 weeks.  If you need additional refills before your next appointment, please call your pharmacy first.  

## 2021-08-23 ENCOUNTER — Telehealth: Payer: Self-pay

## 2021-08-23 ENCOUNTER — Ambulatory Visit: Payer: Medicare HMO | Admitting: Family Medicine

## 2021-08-23 NOTE — Telephone Encounter (Signed)
? ?  Telephone encounter was:  Successful.  ?08/23/2021 ?Name: [name] [name] MRN: [id number] DOB: 02-Dec-1950 ? ?[name] is a 71 y.o. year old female who is a primary care patient of Zola Button, MD . The community resource team was consulted for assistance with Transportation Needs  ? ?Care guide performed the following interventions: Patient provided with information about care guide support team and interviewed to confirm resource needs.Patient stated she needs transportation to her doctor visits. She stated she does have transportation with her insurance. I encouraged her to exhaust all of her rides with her insurance. I emailed other resources to her granddaughter and mailing to home     ? ?Follow Up Plan:  No further follow up planned at this time. The patient has been provided with needed resources. ? ? ? ?Larena Sox ?Care Guide, Embedded Care Coordination ?[facility], Care Management  ?[phone] ?[address], [place], [place] [address] ?Phone: [phone] ?Email: [email or web address] ? ?  ?

## 2021-08-26 ENCOUNTER — Telehealth: Payer: Medicare HMO

## 2021-09-03 ENCOUNTER — Ambulatory Visit: Payer: Medicare HMO | Admitting: Licensed Clinical Social Worker

## 2021-09-03 ENCOUNTER — Other Ambulatory Visit: Payer: Self-pay | Admitting: Family Medicine

## 2021-09-03 ENCOUNTER — Ambulatory Visit: Payer: Medicare HMO

## 2021-09-03 DIAGNOSIS — E1142 Type 2 diabetes mellitus with diabetic polyneuropathy: Secondary | ICD-10-CM

## 2021-09-03 MED ORDER — ONDANSETRON HCL 4 MG PO TABS: 4.0000 mg | ORAL_TABLET | Freq: Three times a day (TID) | ORAL | 0 refills | Status: DC | PRN

## 2021-09-03 NOTE — Patient Instructions (Signed)
Visit Information ? ?Ms. Llamas  it was nice speaking with you. Please call me directly [phone] if you have questions about the goals we discussed. ? ?  ?Patient Goals/Self Care Activities: ?-Patient/Caregiver will self-administer medications as prescribed as evidenced by self-report/primary caregiver report  ?-Patient/Caregiver will attend all scheduled provider appointments as evidenced by clinician review of documented attendance to scheduled appointments and patient/caregiver report ?-Patient/Caregiver will call pharmacy for medication refills as evidenced by patient report and review of pharmacy fill history as appropriate ?-Patient/Caregiver will call provider office for new concerns or questions as evidenced by review of documented incoming telephone call notes and patient report ?-Patient/Caregiver verbalizes understanding of plan ?-Patient/Caregiver will focus on medication adherence by taking medications as prescribed  ?-check blood sugar at prescribed times ?-check blood sugar if I feel it is too high or too low ?-record values and write them down take them to all doctor visits   ?-Avoid smoke and air pollution ?-Keep your airway clear from mucus build up  ?-Control your cough by drinking plenty of water ?-Self assess COPD action plan zone and make appointment with provider if you have been in the yellow zone for 48 hours without improvement.  ?  ? ? ?The patient verbalized understanding of instructions, educational materials, and care plan provided today and agreed to receive a mailed copy of patient instructions, educational materials, and care plan.  ? ?Follow up Plan: Patient would like continued follow-up.  CCM RNCM will outreach the patient within the next 5 weeks.  Patient will call office if needed prior to next encounter ? ?Lazaro Arms, RN ? ?[phone]  ?

## 2021-09-03 NOTE — Patient Instructions (Signed)
Visit Information ? ?Instructions:  ? ?Patient was given the following information about care management and care coordination services today, agreed to services, and gave verbal consent: 1.care management/care coordination services include personalized support from designated clinical staff supervised by their physician, including individualized plan of care and coordination with other care providers 2. 24/7 contact phone numbers for assistance for urgent and routine care needs. 3. The patient may stop care management/care coordination services at any time by phone call to the office staff. ? ?Patient verbalizes understanding of instructions and care plan provided today and agrees to view in [place]. Active MyChart status confirmed with patient.   ? ?No further follow up required: . ? ?Milus Height, BSW, MSW  ?Social Worker ?[facility]  ?[phone] ?  ? ?  ?

## 2021-09-03 NOTE — Chronic Care Management (AMB) (Deleted)
?  Care Management  ? ?Social Work Visit Note ? ?09/03/2021 ?Name: [name] [name] MRN: [id number] DOB: 03/10/1951 ? ?[name] [name] is a 71 y.o. year old female who sees [name] Button, MD for primary care. The care management team was consulted for assistance with care management and care coordination needs related to  initial   ? ?{CAREMANAGEMENTCONSENTOPTIONS:24923} ? ?{CCMTELEPHONEFACETOFACE:24906} for {CCMINITIALFOLLOWUPCHOICE:24907} in response to provider referral for social work chronic care management and care coordination services. ? ?Assessment: Review of patient history, allergies, and health status during evaluation of patient need for care management/care coordination services.   ? ?Interventions:  ?Patient interviewed and appropriate assessments performed ?Collaborated with clinical team regarding patient needs  ?{MMBSWINTERVENTIONS:24201} ? ?SDOH (Social Determinants of Health) assessments performed: {yes/no:20286} ?   ? ?Plan:  ?patient will work with BSW to address needs related to {MM SW BARRIERS:24200} ?{MM FOLLOW UP AYTK:16010} ? ?SIGNATURE ? ? ? ? ? ? ? ? ? ? ? ? ? ? ?

## 2021-09-03 NOTE — Chronic Care Management (AMB) (Signed)
?  Care Management  ? ?Social Work Visit Note ? ?09/03/2021 ?Name: [name] [name] MRN: [id number] DOB: Sep 26, 1950 ? ?[name] [name] is a 71 y.o. year old female who sees Zola Button, MD for primary care. The care management team was consulted for assistance with care management and care coordination needs related to  Initial Outreach   ? ?Patient was given the following information about care management and care coordination services today, agreed to services, and gave verbal consent: 1.care management/care coordination services include personalized support from designated clinical staff supervised by their physician, including individualized plan of care and coordination with other care providers 2. 24/7 contact phone numbers for assistance for urgent and routine care needs. 3. The patient may stop care management/care coordination services at any time by phone call to the office staff. ? ?Engaged with patient by telephone for initial visit in response to provider referral for social work chronic care management and care coordination services. ? ?Assessment: Review of patient history, allergies, and health status during evaluation of patient need for care management/care coordination services.   ? ?Interventions:  ?Patient interviewed and appropriate assessments performed ?Collaborated with clinical team regarding patient needs  ?Patient began conversation reporting she was in pain. Pain in lower back on both sides, pain in both knees, and feet. Patient complained of burning in stomach. SW requested RNCM to outreach patient. ?Patient advised she at times runs out of food depending on prices in stores. SW sent e-mail listing of food banks and pantries as requested. ?Patient stated she has upcoming medical appointsments and she has transportation, but will need to schedule no earlier than 72-hrs before appointment.  ?Patient stated she has no housing needs. Patient has utilized DSS for utility assistance in the past  and is willing to receive assistance again when needed. ?Patient stated no additional follow up is needed. Patient has SW contact information if additional needs arise. ? ?SDOH (Social Determinants of Health) assessments performed: Yes ?SDOH Interventions   ? ?Flowsheet Row Most Recent Value  ?SDOH Interventions   ?Food Insecurity Interventions Other (Comment)  Manufacturing engineer educated patient on [employer] banks and patries. Social Worker sent resources to patient.]  ? ?  ?   ? ?Plan:  ?No further follow up- per patient request. ? ?Milus Height, BSW, MSW  ?Social Worker ?[facility]  ?[phone] ?  ? ? ? ? ? ? ? ? ? ? ? ? ? ?

## 2021-09-03 NOTE — Chronic Care Management (AMB) (Signed)
?Chronic Care Management  ? ?CCM RN Visit Note ? ?09/03/2021 ?Name: [name] [name] MRN: [id number] DOB: 07-02-1950 ? ?Subjective: ?[name] [name] is a 71 y.o. year old female who is a primary care patient of Zola Button, MD. The care management team was consulted for assistance with disease management and care coordination needs.   ? ?Engaged with patient by telephone for follow up visit in response to provider referral for case management and/or care coordination services.  ? ?Consent to Services:  ?The patient was given information about Chronic Care Management services, agreed to services, and gave verbal consent prior to initiation of services.  Please see initial visit note for detailed documentation.  ? ?Patient agreed to services and verbal consent obtained.  ? ? ?Summary: The patient has suffered from low back pain, leg and foot cramping, and nausea (without vomiting) for the last three to four days. Despite using [facility] and heat to alleviate the pain, it is still rated at 10/10. Marland Kitchen See Care Plan below for interventions and patient self-care actives. ? ?Recommendation: The patient may benefit from taking medications as prescribed, checking Blood sugar and record values, calling your physician if numbers are abnormal, and The patient agrees. ? ?Follow up Plan: Patient would like continued follow-up.  CCM RNCM will outreach the patient within the next 5 weeks.  Patient will call office if needed prior to next encounter ? ? ?Assessment: Review of patient past medical history, allergies, medications, health status, including review of consultants reports, laboratory and other test data, was performed as part of comprehensive evaluation and provision of chronic care management services.  ? ?SDOH (Social Determinants of Health) assessments and interventions performed:   ? ? ?Conditions to be addressed/monitored:COPD and DMII ? ?Care Plan : RN Case Manager  ?Updates made by Lazaro Arms, RN since 09/03/2021 12:00  AM  ?  ? ?Problem: General Plan of Care in a patient with Diabetes andd COPD   ?  ? ?Long-Range Goal: The patiednt will learn skills tto monitor and manage her chroonice conditions to prevent admission to the hospital and keep her levels at a normal range.   ?Start Date: 08/21/2021  ?Expected End Date: 08/22/2022  ?Priority: High  ?Note:   ?Current Barriers:  ?Chronic Disease Management support and education needs related to COPD and DMII ? ?RNCM Clinical Goal(s):  ?Patient will verbalize basic understanding of COPD and DMII disease process and self health management plan as evidenced by preventing hospital admissions for 30 days and maintaining acceptable levels  through collaboration with RN Care manager, provider, and care team.  ? ?Interventions: ?1:1 collaboration with primary care provider regarding development and update of comprehensive plan of care as evidenced by provider attestation and co-signature ?Inter-disciplinary care team collaboration (see longitudinal plan of care) ?Evaluation of current treatment plan related to  self management and patient's adherence to plan as established by provider ? ? ?COPD: (Status: Goal on Track (progressing): YES.) Long Term Goal  ?Reviewed medications with patient, including use of prescribed maintenance and rescue inhalers, and provided instruction on medication management and the importance of adherence ?Provided patient with basic written and verbal COPD education on self care/management/and exacerbation prevention ?Advised patient to track and manage COPD triggers ?Advised patient to self assesses COPD action plan zone and make appointment with provider if in the yellow zone for 48 hours without improvement ?Provided education about and advised patient to utilize infection prevention strategies to reduce risk of respiratory infection ?Discussed the importance of adequate rest  and management of fatigue with COPD ? ?Diabetes:  (Status: New goal.) Long Term Goal  ? ?Lab  Results  ?Component Value Date  ? HGBA1C 8.4 (A) 02/27/2021  ? @ ?Assessed patient's understanding of A1c goal: <7% ?Provided education to patient about basic DM disease process; ?Reviewed medications with patient and discussed importance of medication adherence;        ?Counseled on importance of regular laboratory monitoring as prescribed;        ?Discussed plans with patient for ongoing care management follow up and provided patient with direct contact information for care management team;      ?Referral made to social work team for assistance with Anxiety stress and depression. ?Message sent to PCP for new glucometer   ?Review of patient status, including review of consultants reports, relevant laboratory and other test results, and medications completed;     ?09/03/21:  The patient has suffered from low back pain, leg and foot cramping, and nausea (without vomiting) for the last three to four days. Despite using [facility] and heat to alleviate the pain, it is still rated at 10/10. The patient's blood sugar levels range between 150-180 with no breathing difficulties. She requested I send a message for a prescription refill  for Zofran to her primary care physician, and I advised her that she may need to schedule an appointment. We reviewed the educational material that was sent about Diabetes and COPD action plans. ?   ? ?Patient Goals/Self Care Activities: ?-Patient/Caregiver will self-administer medications as prescribed as evidenced by self-report/primary caregiver report  ?-Patient/Caregiver will attend all scheduled provider appointments as evidenced by clinician review of documented attendance to scheduled appointments and patient/caregiver report ?-Patient/Caregiver will call pharmacy for medication refills as evidenced by patient report and review of pharmacy fill history as appropriate ?-Patient/Caregiver will call provider office for new concerns or questions as evidenced by review of documented incoming  telephone call notes and patient report ?-Patient/Caregiver verbalizes understanding of plan ?-Patient/Caregiver will focus on medication adherence by taking medications as prescribed  ?-check blood sugar at prescribed times ?-check blood sugar if I feel it is too high or too low ?-record values and write them down take them to all doctor visits   ?-Avoid smoke and air pollution ?-Keep your airway clear from mucus build up  ?-Control your cough by drinking plenty of water ?-Self assess COPD action plan zone and make appointment with provider if you have been in the yellow zone for 48 hours without improvement.  ?  ? ?  ? ?Lazaro Arms RN, BSN, [place] ?Care Management Coordinator ?[place]  ?Phone: [phone]  ?  ? ? ? ? ? ? ? ?

## 2021-09-05 ENCOUNTER — Other Ambulatory Visit: Payer: Self-pay | Admitting: Family Medicine

## 2021-09-09 NOTE — Progress Notes (Signed)
SUBJECTIVE:   CHIEF COMPLAINT / HPI:  Chief Complaint  Patient presents with   Back Pain    Patient has had ongoing chronic back pain related to spinal stenosis.  Previously has gone to pain management with [facility].  Contact information was provided previously but patient has not yet called for appointment.  Patient also reports congestion, rhinorrhea, facial pain, bilateral ear pain ongoing for the past 3 to 4 weeks.  She reports difficulty breathing through her nose due to congestion.  She had a fever of 104 F about 3 weeks ago.  She thinks she has a sinus infection.  She has been using cetirizine and Flonase without significant relief.  I treated her for bacterial sinus infection about 5 months ago, patient states she had significant relief with antibiotics.  She used to see ENT several years ago, last visit documented in 2019 with [facility].  T2DM Patient reports that she is still taking Lantus 22 units twice daily.  She has not been checking her blood sugar regularly as her glucometer is having issues.  She states that when she was checking her sugars, her fasting sugars were in the 190s to 200s.  She has previously been checking her sugar once a day with the help of her granddaughter.  She reports that she is having symptomatic hypoglycemia (shakiness which resolves after eating something) daily. She is also reporting increased urination and increased thirst/dehydration.  PERTINENT  PMH / PSH: CAD, HTN, COPD, GERD, T2DM, HLD, spinal stenosis  Patient Care Team: Zola Button, MD as PCP - General (Family Medicine) Debara Pickett, Nadean Corwin, MD as PCP - Cardiology (Cardiology) [name], MD as Consulting Physician (Gastroenterology) Warden Fillers, MD as Consulting Physician (Ophthalmology) Lazaro Arms, RN as [place] Management   OBJECTIVE:   BP (!) 151/53   Pulse 85   Ht '5\' 3"'$  (1.6 m)   Wt 207 lb 8 oz (94.1 kg)   SpO2 97%   BMI 36.76 kg/m    Physical Exam Constitutional:      General: She is not in acute distress. HENT:     Head:     Comments: Bilateral frontal and maxillary sinus tenderness    Ears:     Comments: Right TM appears normal.  Left TM occluded by cerumen.    Nose:     Comments: Bilateral turbinates mildly edematous and erythematous    Mouth/Throat:     Mouth: Mucous membranes are moist.     Comments: Diffuse oral thrush noted on tongue Cardiovascular:     Rate and Rhythm: Normal rate and regular rhythm.  Pulmonary:     Effort: Pulmonary effort is normal. No respiratory distress.     Breath sounds: Normal breath sounds.  Musculoskeletal:     Cervical back: Neck supple.  Neurological:     Mental Status: She is alert.        09/11/2021    3:28 PM  Depression screen PHQ 2/9  Decreased Interest 2  Down, Depressed, Hopeless 1  PHQ - 2 Score 3  Altered sleeping 3  Tired, decreased energy 1  Change in appetite 1  Feeling bad or failure about yourself  0  Trouble concentrating 1  Moving slowly or fidgety/restless 1  Suicidal thoughts 0  PHQ-9 Score 10  Difficult doing work/chores Very difficult     {Show previous vital signs (optional):23777}    ASSESSMENT/PLAN:   Bacterial sinusitis Given chronicity of symptoms, lack of response to antihistamine and  intranasal corticosteroids, and recent fever, I think it is reasonable to treat her for bacterial sinusitis.  She does have tenderness to frontal and maxillary sinuses on exam. - amoxicillin-clavulanate BID x 5d - consider re-engaging ENT if this is a persistent issue  Chronic pain syndrome Contact information for [facility] provided again.  DM type 2 with diabetic peripheral neuropathy ([place]) Suspect this is likely worsening with frequent hypoglycemic episodes.  Difficult to assess recent control with malfunctioning glucometer. - decrease Lantus to 20 units BID - new glucometer sample provided by Dr. Valentina Lucks today, test  strips refilled - advised to check blood sugar 3-4 times per day given frequent hypoglycemia - advised to follow-up with pharmacy clinic, I think she would benefit from CGM given difficult to control with hyperglycemia and hypoglycemia - A1c ordered, patient unable to collect labs as transportation was waiting for her   Oral thrush Possibly related to ICS use. - nystatin suspension refilled  Return in about 3 months (around 12/12/2021) for f/u DM.   Zola Button, MD [place]

## 2021-09-09 NOTE — Patient Instructions (Incomplete)
It was nice seeing you today!  Take antibiotics as prescribed.  Go back to Lantus 20 units twice a day.  Call [facility] for appointment. [phone] [address], [place], [place] [address]  Schedule a visit with Dr. Raymondo Band as soon as you are able to for diabetes.  Follow-up in 3 months for diabetes.  Stay well, Littie Deeds, MD [facility] [phone]  --  Make sure to check out at the front desk before you leave today.  Please arrive at least 15 minutes prior to your scheduled appointments.  If you had blood work today, I will send you a MyChart message or a letter if results are normal. Otherwise, I will give you a call.  If you had a referral placed, they will call you to set up an appointment. Please give [place] a call if you don't hear back in the next 2 weeks.  If you need additional refills before your next appointment, please call your pharmacy first.

## 2021-09-11 ENCOUNTER — Ambulatory Visit (INDEPENDENT_AMBULATORY_CARE_PROVIDER_SITE_OTHER): Payer: Medicare HMO | Admitting: Family Medicine

## 2021-09-11 ENCOUNTER — Encounter: Payer: Self-pay | Admitting: Family Medicine

## 2021-09-11 VITALS — BP 151/53 | HR 85 | Ht 63.0 in | Wt 207.5 lb

## 2021-09-11 DIAGNOSIS — B370 Candidal stomatitis: Secondary | ICD-10-CM

## 2021-09-11 DIAGNOSIS — E1142 Type 2 diabetes mellitus with diabetic polyneuropathy: Secondary | ICD-10-CM

## 2021-09-11 DIAGNOSIS — G894 Chronic pain syndrome: Secondary | ICD-10-CM | POA: Diagnosis not present

## 2021-09-11 DIAGNOSIS — E1159 Type 2 diabetes mellitus with other circulatory complications: Secondary | ICD-10-CM

## 2021-09-11 DIAGNOSIS — I152 Hypertension secondary to endocrine disorders: Secondary | ICD-10-CM

## 2021-09-11 DIAGNOSIS — R6889 Other general symptoms and signs: Secondary | ICD-10-CM | POA: Diagnosis not present

## 2021-09-11 MED ORDER — AMOXICILLIN-POT CLAVULANATE 875-125 MG PO TABS: 1.0000 | ORAL_TABLET | Freq: Two times a day (BID) | ORAL | 0 refills | Status: AC

## 2021-09-11 MED ORDER — NYSTATIN 100000 UNIT/ML MT SUSP: 200000.0000 [IU] | Freq: Four times a day (QID) | OROMUCOSAL | 1 refills | Status: DC

## 2021-09-11 MED ORDER — ACCU-CHEK GUIDE VI STRP: ORAL_STRIP | 12 refills | Status: DC

## 2021-09-12 ENCOUNTER — Other Ambulatory Visit (INDEPENDENT_AMBULATORY_CARE_PROVIDER_SITE_OTHER): Payer: Medicare HMO

## 2021-09-12 ENCOUNTER — Ambulatory Visit: Payer: Medicare HMO | Admitting: Physician Assistant

## 2021-09-12 ENCOUNTER — Encounter: Payer: Self-pay | Admitting: Physician Assistant

## 2021-09-12 ENCOUNTER — Telehealth: Payer: Self-pay | Admitting: *Deleted

## 2021-09-12 VITALS — BP 142/80 | HR 84 | Ht 63.0 in | Wt 209.4 lb

## 2021-09-12 DIAGNOSIS — R070 Pain in throat: Secondary | ICD-10-CM | POA: Diagnosis not present

## 2021-09-12 DIAGNOSIS — K3184 Gastroparesis: Secondary | ICD-10-CM

## 2021-09-12 DIAGNOSIS — R1030 Lower abdominal pain, unspecified: Secondary | ICD-10-CM

## 2021-09-12 DIAGNOSIS — K219 Gastro-esophageal reflux disease without esophagitis: Secondary | ICD-10-CM | POA: Diagnosis not present

## 2021-09-12 DIAGNOSIS — R110 Nausea: Secondary | ICD-10-CM | POA: Diagnosis not present

## 2021-09-12 DIAGNOSIS — R6889 Other general symptoms and signs: Secondary | ICD-10-CM | POA: Diagnosis not present

## 2021-09-12 LAB — CBC WITH DIFFERENTIAL/PLATELET
Basophils Absolute: 0 10*3/uL (ref 0.0–0.1)
Basophils Relative: 0.8 % (ref 0.0–3.0)
Eosinophils Absolute: 0.1 10*3/uL (ref 0.0–0.7)
Eosinophils Relative: 2.1 % (ref 0.0–5.0)
HCT: 41.3 % (ref 36.0–46.0)
Hemoglobin: 13.2 g/dL (ref 12.0–15.0)
Lymphocytes Relative: 35.2 % (ref 12.0–46.0)
Lymphs Abs: 2 10*3/uL (ref 0.7–4.0)
MCHC: 31.9 g/dL (ref 30.0–36.0)
MCV: 86.1 fl (ref 78.0–100.0)
Monocytes Absolute: 0.4 10*3/uL (ref 0.1–1.0)
Monocytes Relative: 7.6 % (ref 3.0–12.0)
Neutro Abs: 3.1 10*3/uL (ref 1.4–7.7)
Neutrophils Relative %: 54.3 % (ref 43.0–77.0)
Platelets: 188 10*3/uL (ref 150.0–400.0)
RBC: 4.8 Mil/uL (ref 3.87–5.11)
RDW: 14.4 % (ref 11.5–15.5)
WBC: 5.8 10*3/uL (ref 4.0–10.5)

## 2021-09-12 LAB — COMPREHENSIVE METABOLIC PANEL
ALT: 12 U/L (ref 0–35)
AST: 14 U/L (ref 0–37)
Albumin: 4.3 g/dL (ref 3.5–5.2)
Alkaline Phosphatase: 73 U/L (ref 39–117)
BUN: 10 mg/dL (ref 6–23)
CO2: 29 mEq/L (ref 19–32)
Calcium: 9.3 mg/dL (ref 8.4–10.5)
Chloride: 104 mEq/L (ref 96–112)
Creatinine, Ser: 0.85 mg/dL (ref 0.40–1.20)
GFR: 68.16 mL/min (ref >60.00)
Glucose, Bld: 172 mg/dL — ABNORMAL HIGH (ref 70–99)
Potassium: 3.6 mEq/L (ref 3.5–5.1)
Sodium: 139 mEq/L (ref 135–145)
Total Bilirubin: 0.4 mg/dL (ref 0.2–1.2)
Total Protein: 8.1 g/dL (ref 6.0–8.3)

## 2021-09-12 MED ORDER — ACCU-CHEK GUIDE VI STRP: ORAL_STRIP | 12 refills | Status: DC

## 2021-09-12 MED ORDER — ONDANSETRON HCL 4 MG PO TABS: ORAL_TABLET | ORAL | 3 refills | Status: DC

## 2021-09-12 NOTE — Assessment & Plan Note (Signed)
Contact information for [facility] provided again.

## 2021-09-12 NOTE — Patient Instructions (Signed)
If you are age 73 or older, your body mass index should be between 23-30. Your Body mass index is 37.09 kg/m. If this is out of the aforementioned range listed, please consider follow up with your Primary Care Provider. ________________________________________________________  The [facility] providers would like to encourage you to use [facility] to communicate with providers for non-urgent requests or questions.  Due to long hold times on the telephone, sending your provider a message by [facility] may be a faster and more efficient way to get a response.  Please allow 48 business hours for a response.  Please remember that this is for non-urgent requests.  _______________________________________________________  Your provider has requested that you go to the basement level for lab work before leaving today. Press "B" on the elevator. The lab is located at the first door on the left as you exit the elevator.  You have been scheduled for a CT scan of the abdomen and pelvis at [facility] ([address]---this is in the same building as [employer]).   You are scheduled on 09/23/2021 at 3:00 pm. You should arrive 15 minutes prior to your appointment time for registration. Please follow the written instructions below on the day of your exam:  WARNING: IF YOU ARE ALLERGIC TO IODINE/X-RAY DYE, PLEASE NOTIFY RADIOLOGY IMMEDIATELY AT [phone]! YOU WILL BE GIVEN A 13 HOUR PREMEDICATION PREP.  1) Do not eat or drink anything after 11:00 am (4 hours prior to your test) 2) You have been given 2 bottles of oral contrast to drink. The solution may taste better if refrigerated, but do NOT add ice or any other liquid to this solution. Shake well before drinking.    Drink 1 bottle of contrast @ 1:00 pm (2 hours prior to your exam)  Drink 1 bottle of contrast @ 2:00 pm (1 hour prior to your exam)  You may take any medications as prescribed with a small amount of water, if necessary. If you  take any of the following medications: METFORMIN, GLUCOPHAGE, GLUCOVANCE, AVANDAMET, RIOMET, FORTAMET, [place] MET, JANUMET, GLUMETZA or METAGLIP, you MAY be asked to HOLD this medication 48 hours AFTER the exam.  The purpose of you drinking the oral contrast is to aid in the visualization of your intestinal tract. The contrast solution may cause some diarrhea. Depending on your individual set of symptoms, you may also receive an intravenous injection of x-ray contrast/dye. Plan on being at [facility] for 30 minutes or longer, depending on the type of exam you are having performed.  This test typically takes 30-45 minutes to complete.  If you have any questions regarding your exam or if you need to reschedule, you may call the CT department at [phone] between the hours of 8:00 am and 5:00 pm, Monday-Friday.  ________________________________________________________________________  Continue Ondansetron 1 tablet in the morning and 1 tablet 6-8 hours later.  Continue Reglan 5 mg and Pantoprazole 40 mg  Take 1 capful of Miralax in 8 ounces of water or juice daily.  We have sent a referral to ENT. Please allow at least 2 weeks for them to contact you.  You have been scheduled to follow up with Dr. Havery Moros on October 18, 2021 at 9:00 am  Thank you for entrusting me with your care and choosing [facility].  Amy Esterwood, PA-C

## 2021-09-12 NOTE — Telephone Encounter (Signed)
Pharmacy calls because the SIG of "use as directed" is not approved thru insurances for glucometers.  A new script will need to be sent in with how many times a day / Dx code and will also need to be sent in by a [employer] certified provider since it is medicare.   Spoke with Dr. Nancy Fetter, who gave the ok to change the script. Christen Bame, CMA

## 2021-09-12 NOTE — Progress Notes (Signed)
Agree with assessment and plan as outlined.  If the Reglan really is not helping much we can taper it off and see how she does on the the rest of the regimen as outlined.

## 2021-09-12 NOTE — Assessment & Plan Note (Signed)
Suspect this is likely worsening with frequent hypoglycemic episodes.  Difficult to assess recent control with malfunctioning glucometer. - decrease Lantus to 20 units BID - new glucometer sample provided by Dr. Valentina Lucks today, test strips refilled - advised to check blood sugar 3-4 times per day given frequent hypoglycemia - advised to follow-up with pharmacy clinic, I think she would benefit from CGM given difficult to control with hyperglycemia and hypoglycemia - A1c ordered, patient unable to collect labs as transportation was waiting for her

## 2021-09-12 NOTE — Progress Notes (Signed)
Subjective:    Patient ID: [name] [name], female    DOB: November 20, 1950, 71 y.o.   MRN: [id number]  HPI [name] [name] is a 71 year old African-American female established with Dr. Havery Moros.  She was last seen here in December 2022 for follow-up of GERD, dysphagia, abdominal pain, gastroparesis and prior history of duodenal adenoma.  Other medical problems include congestive heart failure with preserved EF, coronary artery disease, COPD, chronic bronchitis, nonalcoholic fatty liver disease, depression, interstitial cystitis and prior history of pancreatitis for which she is status postcholecystectomy.  She last had colonoscopy in April 2021 with removal of 3 adenomatous polyps, the largest was 1 cm. After her last office visit she had EGD in January 2023 with finding of a 1 cm hiatal hernia, grade a esophagitis, she was empirically dilated to 18 mm for complaints of dysphagia and had a normal-appearing stomach and duodenum.  Biopsies showed reactive gastropathy no H. pylori.  She comes in today with complaints of ongoing abdominal pain and nausea.  At the time of her last office visit she was started on metoclopramide 5 mg 3 times daily AC and PPI was increased to twice daily. She says she does not feel any better and is not certain she noticed any difference after those changes.  She continues to have an irritated feeling in her throat and esophagus, is also complaining of some pain in her throat with swallowing which is now causing her right ear to hurt over the past few months.  She has not been seen by ENT. She continues to complain of nausea, and has been using Zofran as needed.  She has not been vomiting .She is also complaining of bilateral lower abdominal pain which seems particularly worse when going from a lying down to a sitting up position and says she gets a sensation of something "balling up" in her abdomen and feels that something is not right. She has had prior surgery for ventral  hernias.  Review of Systems.Pertinent positive and negative review of systems were noted in the above HPI section.  All other review of systems was otherwise negative.   Outpatient Encounter Medications as of 09/12/2021  Medication Sig   acetaminophen (TYLENOL) 325 MG tablet Take 2 tablets (650 mg total) by mouth every 4 (four) hours as needed for mild pain (or Fever >/= 101).   albuterol (VENTOLIN HFA) 108 (90 Base) MCG/ACT inhaler INHALE 2 PUFFS INTO THE LUNGS EVERY 6 HOURS AS NEEDED FOR WHEEZING OR SHORTNESS OF BREATH   amLODipine (NORVASC) 10 MG tablet TAKE 1 TABLET(10 MG) BY MOUTH DAILY   amoxicillin-clavulanate (AUGMENTIN) 875-125 MG tablet Take 1 tablet by mouth 2 (two) times daily for 5 days.   aspirin EC 81 MG tablet Take 81 mg by mouth daily.   Blood Glucose Monitoring Suppl (ACCU-CHEK GUIDE) w/Device KIT 1 Device by Does not apply route 4 (four) times daily.   budesonide-formoterol (SYMBICORT) 80-4.5 MCG/ACT inhaler Inhale 2 puffs into the lungs 2 (two) times daily.   carvedilol (COREG) 25 MG tablet Take 1 tablet (25 mg total) by mouth 2 (two) times daily with a meal.   cetirizine (ZYRTEC) 10 MG tablet Take 1 tablet (10 mg total) by mouth daily.   clobetasol ointment (TEMOVATE) 3.50 % Apply 1 application topically 2 (two) times daily. Apply under nails   dextromethorphan-guaiFENesin (MUCINEX DM) 30-600 MG 12hr tablet Take 1 tablet by mouth 2 (two) times daily.   dicyclomine (BENTYL) 10 MG capsule Take 1 capsule (10 mg total) by mouth  every 8 (eight) hours as needed for spasms.   fluticasone (FLONASE) 50 MCG/ACT nasal spray Place 2 sprays into both nostrils daily.   gabapentin (NEURONTIN) 100 MG capsule TAKE 3 CAPSULES(300 MG) BY MOUTH TWICE DAILY   hydrOXYzine (ATARAX) 10 MG tablet Take 1 tablet (10 mg total) by mouth 3 (three) times daily as needed.   insulin glargine (LANTUS SOLOSTAR) 100 UNIT/ML Solostar Pen INJECT 22 UNITS UNDER THE SKIN TWICE DAILY   Insulin Pen Needle 32G X 4 MM  MISC Use as needed   ipratropium (ATROVENT) 0.03 % nasal spray Place 2 sprays into both nostrils 3 (three) times daily.   Lancet Devices (ONE TOUCH DELICA LANCING DEV) MISC 1 Device by Does not apply route 4 (four) times daily.   metoCLOPramide (REGLAN) 5 MG tablet Take 1 tablet (5 mg total) by mouth 3 (three) times daily before meals.   mirabegron ER (MYRBETRIQ) 25 MG TB24 tablet Take 25 mg by mouth daily.   nystatin (MYCOSTATIN) 100000 UNIT/ML suspension Take 2 mLs (200,000 Units total) by mouth 4 (four) times daily. Apply 90m to each cheek   nystatin (MYCOSTATIN/NYSTOP) powder Apply topically 3 (three) times daily. To affected area   OneTouch Delica Lancets 302HMISC USE 4 TIMES DAILY   pantoprazole (PROTONIX) 40 MG tablet Take 1 tablet (40 mg total) by mouth 2 (two) times daily.   polyethylene glycol (MIRALAX) 17 g packet Take 17 g by mouth 2 (two) times daily. Increase as needed   rosuvastatin (CRESTOR) 40 MG tablet TAKE 1 TABLET(40 MG) BY MOUTH DAILY   terbinafine (LAMISIL) 1 % cream Apply 1 application topically 2 (two) times daily. Apply under belly   [DISCONTINUED] glucose blood (ACCU-CHEK GUIDE) test strip Use as instructed   [DISCONTINUED] ondansetron (ZOFRAN) 4 MG tablet Take 1 tablet (4 mg total) by mouth every 8 (eight) hours as needed for nausea or vomiting.   docusate sodium (COLACE) 100 MG capsule Take 100 mg by mouth 2 (two) times daily as needed for moderate constipation. (Patient not taking: Reported on 09/12/2021)   ondansetron (ZOFRAN) 4 MG tablet Take 1 tablet every morning and repeat in 6-8 hours   [DISCONTINUED] glucose blood (ACCU-CHEK GUIDE) test strip Use as instructed   [DISCONTINUED] glucose blood test strip Use as instructed   [DISCONTINUED] nystatin (MYCOSTATIN) 100000 UNIT/ML suspension Take 2 mLs (200,000 Units total) by mouth 4 (four) times daily. Apply 179mto each cheek   No facility-administered encounter medications on file as of 09/12/2021.   Allergies   Allergen Reactions   Desvenlafaxine Other (See Comments)    REACTION: dysphoria/dellusional   Duloxetine Nausea And Vomiting and Other (See Comments)    REACTION: "wheezing" and tremors, dysphoria   Latex Itching    Denies airway, lung involvement   Levofloxacin Other (See Comments)    Shortness of breath with headache   Lithium Rash   Patient Active Problem List   Diagnosis Date Noted   Myofascial pain dysfunction syndrome 07/29/2019   Chronic left-sided thoracic back pain 07/29/2019   Keloid 05/09/2019   HLD (hyperlipidemia) 09/21/2018   NAFLD (nonalcoholic fatty liver disease) 09/10/2018   (HFpEF) heart failure with preserved ejection fraction ([facility]   Hypertension associated with diabetes ([facility]   Right-sided low back pain with right-sided sciatica 07/16/2015   Chronic bronchitis with COPD (chronic obstructive pulmonary disease) ([facility]   Coronary artery disease involving native coronary artery of native heart with angina pectoris ([facility]   Dyslipidemia associated with type 2  diabetes mellitus ([place]) 10/26/2014   Tobacco abuse 10/26/2014   Anxiety state 09/27/2014   Abdominal pain 06/13/2014   Candidal intertrigo 05/11/2013   Fibromyalgia 02/14/2013   Diabetic peripheral neuropathy ([place]) 02/14/2013   DM type 2 with diabetic peripheral neuropathy ([place]) 03/01/2011   Diabetic gastroparesis associated with type 2 diabetes mellitus ([place]) 11/07/2010   Pancreatitis, recurrent 11/07/2010   Chronic cough 09/25/2010   DEPRESSION, MAJOR, WITH PSYCHOTIC BEHAVIOR 08/14/2009   Chronic pain syndrome 08/14/2009   GERD (gastroesophageal reflux disease) 02/11/2008   URINARY INCONTINENCE 10/21/2006   Social History   Socioeconomic History   Marital status: Widowed    Spouse name: Not on file   Number of children: Not on file   Years of education: Not on file   Highest education level: Not on file  Occupational History   Not on file  Tobacco Use   Smoking status: Some Days     Packs/day: 0.20    Years: 30.00    Pack years: 6.00    Types: Cigarettes    Start date: 04/22/1975   Smokeless tobacco: Never   Tobacco comments:    started at age 63 - quit for several years.  Restarted with death of child.  recently quit for days at a time.  currently reports 0-3 cigs per day. recent in crease to 1/2 pack d/t death in family  Vaping Use   Vaping Use: Never used  Substance and Sexual Activity   Alcohol use: No    Alcohol/week: 0.0 standard drinks   Drug use: No   Sexual activity: Never  Other Topics Concern   Not on file  Social History Narrative   Wants providers to know that she loves the lord and goes to church and is tired of being sick      Strengths/assets: likes to be on the go, enjoys taking care of others, strong family ties, enjoys keeping a tidy home   Social Determinants of Radio broadcast assistant Strain: Not on file  Food Insecurity: Food Insecurity Present   Worried About Charity fundraiser in the Last Year: Sometimes true   Arboriculturist in the Last Year: Never true  Transportation Needs: Unmet Transportation Needs   Lack of Transportation (Medical): Yes   Lack of Transportation (Non-Medical): Yes  Physical Activity: Not on file  Stress: Not on file  Social Connections: Not on file  Intimate Partner Violence: Not on file    Ms. Rivest's family history includes Cancer in her brother; Colon cancer in her brother; Diabetes in her brother, mother, sister, and sister; Esophageal cancer in her brother; Esophagitis in her father; Heart attack in her daughter; Heart attack (age of onset: 61) in her mother; Heart disease in her father and mother; Mental retardation in her daughter; Stroke in her mother.      Objective:    Vitals:   09/12/21 0945  BP: (!) 142/80  Pulse: 84  SpO2: 98%    Physical Exam Well-developed well-nourished older African-American female in no acute distress.  Height, Weight, 209 BMI 37.0  HEENT; nontraumatic  normocephalic, EOMI, PE R LA, sclera anicteric. Oropharynx; not examined today Neck; supple, no JVD Cardiovascular; regular rate and rhythm with S1-S2, no murmur rub or gallop Pulmonary; Clear bilaterally Abdomen; soft,  nondistended, she is tender in the lower quadrants bilaterally, low midline incisional scar no palpable mass or hepatosplenomegaly, bowel sounds are active Rectal; not done today Skin; benign exam, no jaundice rash or appreciable  lesions Extremities; no clubbing cyanosis or edema skin warm and dry Neuro/Psych; alert and oriented x4, grossly nonfocal mood and affect appropriate        Assessment & Plan:   #87 72 year old African-American female with chronic GERD, and previous diagnosis of gastroparesis, was started on metoclopramide 5 mg 3 times daily AC in January 2023 with no significant improvement in symptoms.  Continues to complain primarily of nausea without vomiting.  #2 pharyngeal pain with swallowing and right ear pain-doubt of GI etiology, #3 bilateral lower quadrant pain, somewhat positional-usually exacerbated by trying to get up from a lying position, she has some tenderness bilaterally in the lower quadrants but no definite appreciable abdominal wall hernias, he does have prior history of ventral hernia repair #4 status postcholecystectomy #5 history of adenomatous colon polyps-up-to-date with colonoscopy last done April 9842 #6 nonalcoholic fatty liver disease #7.  Depression #8.  Coronary artery disease/congestive heart failure #9.  COPD/chronic bronchitis #10.  Diabetes mellitus  Plan; continue pantoprazole 40 mg p.o. twice daily Continue metoclopramide 5 mg 3 times daily AC asked her to start taking Zofran 4 mg p.o. twice daily on a scheduled basis rather than using as needed  CBC and c-Met today Start taking MiraLAX 17 g in 8 ounces of water every day rather than as needed She will be scheduled for CT of the abdomen and pelvis with contrast We will  arrange for ENT referral for laryngoscopy. Office follow-up with Dr. Havery Moros.  Koven Belinsky Genia Harold PA-C 09/12/2021   Cc: Zola Button, MD

## 2021-09-17 NOTE — Addendum Note (Signed)
Addended by: Zola Button D on: 09/17/2021 08:50 AM   Modules accepted: Orders

## 2021-09-20 ENCOUNTER — Other Ambulatory Visit: Payer: Medicare HMO

## 2021-09-20 DIAGNOSIS — R6889 Other general symptoms and signs: Secondary | ICD-10-CM | POA: Diagnosis not present

## 2021-09-20 DIAGNOSIS — E1142 Type 2 diabetes mellitus with diabetic polyneuropathy: Secondary | ICD-10-CM

## 2021-09-21 LAB — HEMOGLOBIN A1C
Est. average glucose Bld gHb Est-mCnc: 192 mg/dL
Hgb A1c MFr Bld: 8.3 % — ABNORMAL HIGH (ref 4.8–5.6)

## 2021-09-23 ENCOUNTER — Encounter: Payer: Self-pay | Admitting: Family Medicine

## 2021-09-23 ENCOUNTER — Other Ambulatory Visit: Payer: Self-pay | Admitting: Physician Assistant

## 2021-09-23 ENCOUNTER — Ambulatory Visit (INDEPENDENT_AMBULATORY_CARE_PROVIDER_SITE_OTHER)
Admission: RE | Admit: 2021-09-23 | Discharge: 2021-09-23 | Disposition: A | Discharge status: Home / Self Care | Payer: Medicare HMO | Source: Ambulatory Visit | Attending: Physician Assistant | Admitting: Physician Assistant

## 2021-09-23 DIAGNOSIS — R1030 Lower abdominal pain, unspecified: Secondary | ICD-10-CM

## 2021-09-23 DIAGNOSIS — K219 Gastro-esophageal reflux disease without esophagitis: Secondary | ICD-10-CM

## 2021-09-23 DIAGNOSIS — R109 Unspecified abdominal pain: Secondary | ICD-10-CM | POA: Diagnosis not present

## 2021-09-23 DIAGNOSIS — K3184 Gastroparesis: Secondary | ICD-10-CM

## 2021-09-23 DIAGNOSIS — R6889 Other general symptoms and signs: Secondary | ICD-10-CM | POA: Diagnosis not present

## 2021-09-23 DIAGNOSIS — R110 Nausea: Secondary | ICD-10-CM

## 2021-09-23 MED ORDER — IOHEXOL 300 MG/ML  SOLN: 100.0000 mL | Freq: Once | INTRAMUSCULAR | Status: DC | PRN

## 2021-09-24 ENCOUNTER — Telehealth: Payer: Self-pay

## 2021-09-24 NOTE — Telephone Encounter (Signed)
Patient advised of her results. She will continue the Reglan because it helps a little. She is following your instructions from the visit. "Still got that pain in my sides and lower abdomen."  She has not heard from the ENT referral yet. I will send this to Estill Bamberg H to follow up on.

## 2021-09-24 NOTE — Telephone Encounter (Signed)
-----   Message from Alfredia Ferguson, PA-C sent at 09/24/2021 11:44 AM EDT ----- Please call pt and let her know the CT scan looks good , everything in lower abdomen normal , no recurrent lower abdominal hernias- she does have a small epigastric ventral hernia containing  fat .  I would asked that she continue the other measures that we discussed at her office visit.  If she does not think that the metoclopramide/Reglan has made any difference with her nausea, it is okay to stop that if she thinks it is helping, okay to continue  Also please make sure that she got referred to ENT Thank you

## 2021-09-30 ENCOUNTER — Ambulatory Visit (INDEPENDENT_AMBULATORY_CARE_PROVIDER_SITE_OTHER): Payer: Medicare HMO | Admitting: Pharmacist

## 2021-09-30 DIAGNOSIS — E1159 Type 2 diabetes mellitus with other circulatory complications: Secondary | ICD-10-CM | POA: Diagnosis not present

## 2021-09-30 DIAGNOSIS — L301 Dyshidrosis [pompholyx]: Secondary | ICD-10-CM

## 2021-09-30 DIAGNOSIS — E1142 Type 2 diabetes mellitus with diabetic polyneuropathy: Secondary | ICD-10-CM | POA: Diagnosis not present

## 2021-09-30 DIAGNOSIS — I152 Hypertension secondary to endocrine disorders: Secondary | ICD-10-CM | POA: Diagnosis not present

## 2021-09-30 DIAGNOSIS — R6889 Other general symptoms and signs: Secondary | ICD-10-CM | POA: Diagnosis not present

## 2021-09-30 DIAGNOSIS — J449 Chronic obstructive pulmonary disease, unspecified: Secondary | ICD-10-CM | POA: Diagnosis not present

## 2021-09-30 MED ORDER — LANTUS SOLOSTAR 100 UNIT/ML ~~LOC~~ SOPN: 15.0000 [IU] | PEN_INJECTOR | Freq: Two times a day (BID) | SUBCUTANEOUS | 1 refills | Status: DC

## 2021-09-30 MED ORDER — CLOBETASOL PROPIONATE 0.05 % EX OINT: 1.0000 "application " | TOPICAL_OINTMENT | Freq: Two times a day (BID) | CUTANEOUS | 3 refills | Status: DC

## 2021-09-30 MED ORDER — BUDESONIDE 0.25 MG/2ML IN SUSP: 0.2500 mg | Freq: Two times a day (BID) | RESPIRATORY_TRACT | 3 refills | Status: DC

## 2021-09-30 MED ORDER — NOVOLOG FLEXPEN 100 UNIT/ML ~~LOC~~ SOPN: 5.0000 [IU] | PEN_INJECTOR | Freq: Two times a day (BID) | SUBCUTANEOUS | 3 refills | Status: DC

## 2021-09-30 MED ORDER — TERBINAFINE HCL 1 % EX CREA
1.0000 | TOPICAL_CREAM | Freq: Two times a day (BID) | CUTANEOUS | 0 refills | Status: DC
Start: 2021-09-30 — End: 2022-11-07

## 2021-09-30 MED ORDER — ALBUTEROL SULFATE (2.5 MG/3ML) 0.083% IN NEBU: 2.5000 mg | INHALATION_SOLUTION | Freq: Four times a day (QID) | RESPIRATORY_TRACT | 1 refills | Status: DC | PRN

## 2021-09-30 MED ORDER — INSULIN PEN NEEDLE 32G X 4 MM MISC: 3 refills | Status: DC

## 2021-09-30 NOTE — Telephone Encounter (Signed)
Patient aware.  Patient has been seen by Ortho in the past. See the note from Dr Ninfa Linden 03/14/2019. "There is nothing else that I can provide her other than either referral to a spine specialist versus injections versus chronic pain management."   09/11/21 PCP note "  Back Pain    Patient has had ongoing chronic back pain related to spinal stenosis.  Previously has gone to pain management with [facility].  Contact information was provided previously but patient has not yet called for appointment."   Patient is notified of the recommendation for consulting with provider regarding the spinal issues. Which provider should I notify of this recommendation?

## 2021-09-30 NOTE — Patient Instructions (Addendum)
Nice to see you today.   We refilled clobetasol ointment, terbinafine cream, albuterol nebulizer and Pulmicort nebulizer.   Decrease Lantus to 15 units BID and START Novolog 5 units before your largest meal of the day.  Restart amlodipine 10 mg once daily.

## 2021-10-01 ENCOUNTER — Encounter: Payer: Self-pay | Admitting: Pharmacist

## 2021-10-01 NOTE — Progress Notes (Signed)
Reviewed: I agree with Dr. Koval's documentation and management. 

## 2021-10-01 NOTE — Assessment & Plan Note (Signed)
Diabetes longstanding and currently experiencing symptomatic hypoglycemia likely related to erratic meal intake. Patient is able to verbalize appropriate hypoglycemia management plan.  Avoidance of any hypglycemic episodes is the short-term goal in this patient. Medication supply appears adequate. Reduction of basal and addition of bolus insulin with any substantial meal was discussed and agreed upon.  -Decreased dose of basal insulin Lantus  (insulin glargine) from 20 to 15units once daily.   -Initiated a trial of rapid insulin Novolog  (insulin aspart) 5 units with largest meal of the day.  -Patient educated on purpose, proper use, and potential adverse effects of avoiding hypoglycemia.  -Extensively discussed pathophysiology of diabetes, recommended lifestyle interventions, dietary effects on blood sugar control.  -Counseled on s/sx of and management of hypoglycemia.  COPD - longstanding likely related to continued tobacco abuse.  Reinitiated use of albuterol and budesonide nebulizer therapy  Encouraged complete tobacco cessation.  -Deferred extensive discussion until next visit.  Marland Kitchen

## 2021-10-01 NOTE — Progress Notes (Signed)
S:     Chief Complaint  Patient presents with   Medication Management    Diabetes, Hypertension, COPD   [name] [name] is a 71 y.o. female who presents for diabetes evaluation, education, and management.   Patient had medical transportation issues this morning, called our office and arranged to be seen later than her appointment time.  She arrived after 12:00 noon as was visibly stress and apologetic for being late.  She did express gratitude for being seen late. Patient presents with assistance of a cane.   PMH is significant for diabetes, hypertension, tobacco abuse and COPD. Patient was referred and last seen by Primary Care Provider, Dr. Nancy Fetter on 09/11/2021.  At last visit, patient was reduced on Lantus (insulin glargine dose) from 22 to 20 due to recurrent symptomatic hypoglycemia.   Current diabetes medications include: Lantus 20 units BID Current hypertension medications include: amlodipine '10mg'$  (did not take this AM), carvedilol '25mg'$  BID Current hyperlipidemia medications include: rosuvastatin '40mg'$  daily.   Patient reports taking all medications as prescribed.   Patient reports continued symptomatic hypoglycemic event despite recent dose adjustment.   Reported home fasting blood sugars: 70-200   Patient reported dietary habits: Eats few / small  meals/day Drinks water primarily.   Patient-reported exercise habits: limited but helps with grandchildren effectively.   Requested multiple refills  Stated she was breathing worse with use of inhalers compared to previous nebulizer therapy.   Requested return to nebulizer - albuterol and pulmicort regimen (intolerant of anticholinergic therapy - dry mouth- in the past).   Topic medications - request refills for both terbinafine and clobetasol (both long-term use).   O:  Physical Exam Constitutional:      Appearance: She is obese.  Pulmonary:     Effort: Pulmonary effort is normal.  Neurological:     Mental Status: She is  alert.  Psychiatric:        Mood and Affect: Mood normal.        Thought Content: Thought content normal.     Review of Systems  Musculoskeletal:  Positive for joint pain.    Lab Results  Component Value Date   HGBA1C 8.3 (H) 09/20/2021   Vitals:   09/30/21 1212  BP: (!) 192/78  Pulse: 84  SpO2: 99%    Lipid Panel     Component Value Date/Time   CHOL 141 04/22/2018 1431   TRIG 156 (H) 04/22/2018 1431   HDL 45 04/22/2018 1431   CHOLHDL 3.1 04/22/2018 1431   CHOLHDL 3.8 01/14/2016 2308   VLDL 37 01/14/2016 2308   LDLCALC 65 04/22/2018 1431   LDLDIRECT 62.5 12/01/2011 0832     A/P: Diabetes longstanding and currently experiencing symptomatic hypoglycemia likely related to erratic meal intake. Patient is able to verbalize appropriate hypoglycemia management plan.  Avoidance of any hypglycemic episodes is the short-term goal in this patient. Medication supply appears adequate. Reduction of basal and addition of bolus insulin with any substantial meal was discussed and agreed upon.  -Decreased dose of basal insulin Lantus  (insulin glargine) from 20 to 15units once daily.   -Initiated a trial of rapid insulin Novolog  (insulin aspart) 5 units with largest meal of the day.  -Patient educated on purpose, proper use, and potential adverse effects of avoiding hypoglycemia.  -Extensively discussed pathophysiology of diabetes, recommended lifestyle interventions, dietary effects on blood sugar control.  -Counseled on s/sx of and management of hypoglycemia.   COPD - longstanding likely related to continued tobacco abuse.  Reinitiated use of albuterol and budesonide nebulizer therapy  Encouraged complete tobacco cessation.  -Deferred extensive discussion until next visit.   Hypertension longstanding and currently elevated in office today following stressful (late) transportation to the office and lack of taking morning BP medications.  Blood pressure goal of <321 mmHg systolic in  this patient with Isolated systolic hypertension. Medication adherence overall good - however did not take medications this morning.  -Patient agreed to take blood pressure medications as soon as she arrived home.  Repeat blood pressure at next visit.   Written patient instructions provided. Patient verbalized understanding of treatment plan. Total time in face to face counseling 68 minutes.    Follow up pharmacist visit 6/29  PCP clinic visit in in 1-2 months. Patient seen with Berdie Ogren, PharmD Candidate.

## 2021-10-01 NOTE — Assessment & Plan Note (Signed)
Diabetes longstanding and currently experiencing symptomatic hypoglycemia likely related to erratic meal intake. Patient is able to verbalize appropriate hypoglycemia management plan.  Avoidance of any hypglycemic episodes is the short-term goal in this patient. Medication supply appears adequate. Reduction of basal and addition of bolus insulin with any substantial meal was discussed and agreed upon.  -Decreased dose of basal insulin Lantus  (insulin glargine) from 20 to 15units once daily.   -Initiated a trial of rapid insulin Novolog  (insulin aspart) 5 units with largest meal of the day.  -Patient educated on purpose, proper use, and potential adverse effects of avoiding hypoglycemia.  -Extensively discussed pathophysiology of diabetes, recommended lifestyle interventions, dietary effects on blood sugar control.  -Counseled on s/sx of and management of hypoglycemia.

## 2021-10-01 NOTE — Assessment & Plan Note (Signed)
Hypertension longstanding and currently elevated in office today following stressful (late) transportation to the office and lack of taking morning BP medications.  Blood pressure goal of <848 mmHg systolic in this patient with Isolated systolic hypertension. Medication adherence overall good - however did not take medications this morning.  -Patient agreed to take blood pressure medications as soon as she arrived home.  Repeat blood pressure at next visit.

## 2021-10-02 NOTE — Telephone Encounter (Signed)
Patient advised. Records back to the PCP with recommendation.

## 2021-10-04 ENCOUNTER — Ambulatory Visit: Payer: Medicare HMO

## 2021-10-04 NOTE — Patient Instructions (Signed)
Visit Information  Ms. Sigala  it was nice speaking with you. Please call me directly [phone] if you have questions about the goals we discussed.  Patient Goals/Self Care Activities: -Patient/Caregiver will self-administer medications as prescribed as evidenced by self-report/primary caregiver report  -Patient/Caregiver will attend all scheduled provider appointments as evidenced by clinician review of documented attendance to scheduled appointments and patient/caregiver report -Patient/Caregiver will call pharmacy for medication refills as evidenced by patient report and review of pharmacy fill history as appropriate -Patient/Caregiver will call provider office for new concerns or questions as evidenced by review of documented incoming telephone call notes and patient report -Patient/Caregiver verbalizes understanding of plan -Patient/Caregiver will focus on medication adherence by taking medications as prescribed  -check blood sugar at prescribed times -check blood sugar if I feel it is too high or too low -record values and write them down take them to all doctor visits   -Avoid smoke and air pollution -Keep your airway clear from mucus build up  -Control your cough by drinking plenty of water -Self assess COPD action plan zone and make appointment with provider if you have been in the yellow zone for 48 hours without improvement.      The patient verbalized understanding of instructions, educational materials, and care plan provided today and agreed to receive a mailed copy of patient instructions, educational materials, and care plan.   Follow up Plan: Patient would like continued follow-up.  CCM RNCM will outreach the patient within the next 4 weeks.  Patient will call office if needed prior to next encounter  Lazaro Arms, RN  [phone]

## 2021-10-04 NOTE — Chronic Care Management (AMB) (Signed)
Chronic Care Management   CCM RN Visit Note  10/04/2021 Name: [name] [name] MRN: [id number] DOB: 11/16/1950  Subjective: [name] [name] is a 71 y.o. year old female who is a primary care patient of Zola Button, MD. The care management team was consulted for assistance with disease management and care coordination needs.    Engaged with patient by telephone for follow up visit in response to provider referral for case management and/or care coordination services.   Consent to Services:  The patient was given information about Chronic Care Management services, agreed to services, and gave verbal consent prior to initiation of services.  Please see initial visit note for detailed documentation.   Patient agreed to services and verbal consent obtained.     Summary: During our conversation, [name] [name] shared that she feels stressed and has been experiencing high blood pressure. [name] [name] is under the care of Nicholas Lose, Pharm D, who helps manage her conditions of high blood pressure and diabetes. Her blood sugar levels today were 165 in the morning and afternoon, and she also has COPD. She recently had a medical appointment on 09/30/21 and has another scheduled for 10/17/21. [facility] ref[facility] her prescriptions for albuterol and Pulmicort nebulizers. To manage her conditions, Valentina Lucks advised her to decrease her Lantus to 15 units BID and start taking Novolog 5 units before her largest meal. Additionally, he recommended that she restart taking amlodipine 10 mg once daily.. See Care Plan below for interventions and patient self-care actives.  Recommendation: The patient may benefit from taking medications as prescribed, calling your physician if numbers are abnormal, and The patient agrees.  Follow up Plan: Patient would like continued follow-up.  CCM RNCM will outreach the patient within the next 4 weeks  Patient will call office if needed prior to next encounter  Assessment: Review of patient past  medical history, allergies, medications, health status, including review of consultants reports, laboratory and other test data, was performed as part of comprehensive evaluation and provision of chronic care management services.   SDOH (Social Determinants of Health) assessments and interventions performed:  No  Conditions to be addressed/monitored:COPD and DMII  Care Plan : RN Case Manager  Updates made by Lazaro Arms, RN since 10/04/2021 12:00 AM     Problem: General Plan of Care in a patient with Diabetes andd COPD      Long-Range Goal: The patiednt will learn skills tto monitor and manage her chronic conditions to prevent admission to the hospital and keep her levels at a normal range.   Start Date: 08/21/2021  Expected End Date: 01/19/2023  Priority: High  Note:   Current Barriers:  Chronic Disease Management support and education needs related to COPD and DMII  RNCM Clinical Goal(s):  Patient will verbalize basic understanding of COPD and DMII disease process and self health management plan as evidenced by preventing hospital admissions for 30 days and maintaining acceptable levels  through collaboration with RN Care manager, provider, and care team.   Interventions: 1:1 collaboration with primary care provider regarding development and update of comprehensive plan of care as evidenced by provider attestation and co-signature Inter-disciplinary care team collaboration (see longitudinal plan of care) Evaluation of current treatment plan related to  self management and patient's adherence to plan as established by provider   COPD: (Status: Goal on Track (progressing): YES.) Long Term Goal  Reviewed medications with patient, including use of prescribed maintenance and rescue inhalers, and provided instruction on medication management and the importance of adherence  Provided patient with basic written and verbal COPD education on self care/management/and exacerbation  prevention Advised patient to track and manage COPD triggers Advised patient to self assesses COPD action plan zone and make appointment with provider if in the yellow zone for 48 hours without improvement Provided education about and advised patient to utilize infection prevention strategies to reduce risk of respiratory infection Discussed the importance of adequate rest and management of fatigue with COPD  Diabetes:  (Status: New goal.) Long Term Goal   Lab Results  Component Value Date   HGBA1C 8.3 (H) 09/20/2021   @ Assessed patient's understanding of A1c goal: <7% Provided education to patient about basic DM disease process; Reviewed medications with patient and discussed importance of medication adherence;        Counseled on importance of regular laboratory monitoring as prescribed;        Discussed plans with patient for ongoing care management follow up and provided patient with direct contact information for care management team;      Referral made to social work team for assistance with Anxiety stress and depression. Message sent to PCP for new glucometer   Review of patient status, including review of consultants reports, relevant laboratory and other test results, and medications completed;     10/04/21:  During our conversation, [name] [name] shared that she feels stressed and has been experiencing high blood pressure. Unfortunately, her home blood pressure monitor is not working, which makes it difficult for her to keep track of her readings. However, she is planning to get a new monitor through her insurance. [name] [name] is under the care of Nicholas Lose, Pharm D, who helps manage her conditions of high blood pressure and diabetes. Her blood sugar levels today were 165 in the morning and afternoon, and she also has COPD. [name] [name] mentioned that she does well staying indoors, away from the heat, but requires a breathing treatment before she leaves the house. She recently had a  medical appointment on 09/30/21 and has another scheduled for 10/17/21. [facility] ref[facility] her prescriptions for albuterol and Pulmicort nebulizers. To manage her conditions, Valentina Lucks advised her to decrease her Lantus to 15 units BID and start taking Novolog 5 units before her largest meal. Additionally, he recommended that she restart taking amlodipine 10 mg once daily.     Patient Goals/Self Care Activities: -Patient/Caregiver will self-administer medications as prescribed as evidenced by self-report/primary caregiver report  -Patient/Caregiver will attend all scheduled provider appointments as evidenced by clinician review of documented attendance to scheduled appointments and patient/caregiver report -Patient/Caregiver will call pharmacy for medication refills as evidenced by patient report and review of pharmacy fill history as appropriate -Patient/Caregiver will call provider office for new concerns or questions as evidenced by review of documented incoming telephone call notes and patient report -Patient/Caregiver verbalizes understanding of plan -Patient/Caregiver will focus on medication adherence by taking medications as prescribed  -check blood sugar at prescribed times -check blood sugar if I feel it is too high or too low -record values and write them down take them to all doctor visits   -Avoid smoke and air pollution -Keep your airway clear from mucus build up  -Control your cough by drinking plenty of water -Self assess COPD action plan zone and make appointment with provider if you have been in the yellow zone for 48 hours without improvement.       Lazaro Arms RN, BSN, [facility] Care Management Coordinator [facility]  Phone: [phone]

## 2021-10-10 ENCOUNTER — Encounter: Payer: Self-pay | Admitting: *Deleted

## 2021-10-17 ENCOUNTER — Ambulatory Visit: Payer: Medicare HMO | Admitting: Pharmacist

## 2021-10-18 ENCOUNTER — Ambulatory Visit: Payer: Medicare HMO | Admitting: Gastroenterology

## 2021-10-24 ENCOUNTER — Ambulatory Visit (INDEPENDENT_AMBULATORY_CARE_PROVIDER_SITE_OTHER): Payer: Medicare HMO | Admitting: Pharmacist

## 2021-10-24 ENCOUNTER — Encounter: Payer: Self-pay | Admitting: Pharmacist

## 2021-10-24 DIAGNOSIS — L509 Urticaria, unspecified: Secondary | ICD-10-CM

## 2021-10-24 DIAGNOSIS — I152 Hypertension secondary to endocrine disorders: Secondary | ICD-10-CM | POA: Diagnosis not present

## 2021-10-24 DIAGNOSIS — E1159 Type 2 diabetes mellitus with other circulatory complications: Secondary | ICD-10-CM

## 2021-10-24 DIAGNOSIS — E1142 Type 2 diabetes mellitus with diabetic polyneuropathy: Secondary | ICD-10-CM

## 2021-10-24 MED ORDER — TRIAMCINOLONE ACETONIDE 0.1 % EX OINT: 1.0000 | TOPICAL_OINTMENT | Freq: Two times a day (BID) | CUTANEOUS | 1 refills | Status: DC

## 2021-10-24 MED ORDER — LANTUS SOLOSTAR 100 UNIT/ML ~~LOC~~ SOPN: 18.0000 [IU] | PEN_INJECTOR | Freq: Two times a day (BID) | SUBCUTANEOUS | 0 refills | Status: DC

## 2021-10-24 NOTE — Assessment & Plan Note (Signed)
Urticaria - bilateral lower extremities - seen by Dr. Andria Frames, Attending physician.  - Started Triamcinolone 0.1% Ointment - apply 1-2x daily

## 2021-10-24 NOTE — Assessment & Plan Note (Signed)
Hypertension longstanding currently uncontrolled. Blood pressure goal of <130 mmHg. Medication adherence good. Blood pressure control is suboptimal due to lack of adequate regimen. -Continue current amlodipine '10mg'$  daily and carvedilol '25mg'$  BID - Re-Start Losartan '25mg'$  once daily

## 2021-10-24 NOTE — Assessment & Plan Note (Signed)
Diabetes longstanding currently improved control with no episodes of hypoglycemia. Patient is  able to verbalize appropriate hypoglycemia management plan. Medication adherence appears appopropriate, however has not been able to start Bolus mealtime insulin due to finances.  -Increased dose of basal insulin Lantus (insulin glargine)\ from 15 to 18 units daily.   When able to afford rapid insulin Novolog (insulin aspart), start 5 units prior to largest meal once daily (and reduce Lantus to 15 units daily.  -Extensively discussed pathophysiology of diabetes, recommended lifestyle interventions, dietary effects on blood sugar control.

## 2021-10-24 NOTE — Progress Notes (Signed)
S:     Chief Complaint  Patient presents with   Medication Management    Diabetes, Hypertension, Urticaria   [name] [name] is a 71 y.o. female who presents for diabetes and hypertension evaluation, education, and management.  PMH is significant for diabetes, hypertension, tobacco abuse and COPD. Patient was referred and last seen by Primary Care Provider, Dr. Nancy Fetter on 09/11/2021. Patient was last seen by the pharmacy clinic on 09/30/21. At last visit, insulin glargine (Lantus) was further reduced from 20 to 15units once daily and insulin asapart (Novolog) 5 units with largest meal was initiated. BP was elevated at 192/78 mmHg which could be attributed to patient not taking amlodipine as well as transportation stress.      Today, patient arrives in good spirits and presents with a cane for walking assistance. She reports that blood sugars have improved from last visit, ranging 174-220 mg/dL and no readings >250 mg/dL. Most commonly, she sees numbers in the 100 range. She did appropriately adjust her Lantus to 15 units BID; however, she lost the Novolog pen after picking up from the pharmacy and was not able to take any doses. She states she should be able to afford a refill in the next month or two.    Current diabetes medications include: Lantus 15 units BID, Novolog 5 units daily (has not started due to losing medication) Current hypertension medications include: amlodipine '10mg'$  daily and carvedilol '25mg'$  BID Current hyperlipidemia medications include: rosuvastatin '40mg'$  daily.  Patient reports adherence to taking all medications as prescribed.   Do you feel that your medications are working for you? yes Have you been experiencing any side effects to the medications prescribed? no Do you have any problems obtaining medications due to transportation or finances? yes Insurance coverage: [employer]  Patient denies hypoglycemic events.  Reported home fasting blood sugars: 194-220 mg/dL.  No blood sugars >250 mg/dL Reported 2 hour post-meal/random blood sugars: Mostly 100s mg/dL, few 200s.  Patient reports nocturia (nighttime urination).  Patient reports neuropathy (nerve pain).   Patient-reported exercise habits: limited due to mobility issues with walking, climbing stairs   O:   Review of Systems  Musculoskeletal:  Positive for back pain (rated as 8/10) and joint pain.  Skin:  Positive for itching (bilateral lower extremities).    Physical Exam Constitutional:      Appearance: Normal appearance. She is obese.  Pulmonary:     Effort: Pulmonary effort is normal.  Skin:    Comments: Blisters with spots of discoloration  Neurological:     Mental Status: She is alert.  Psychiatric:        Mood and Affect: Mood normal.        Behavior: Behavior normal.     Vitals:   10/24/21 1401  BP: (!) 167/70  Pulse: 87  SpO2: 100%    Lipid Panel     Component Value Date/Time   CHOL 141 04/22/2018 1431   TRIG 156 (H) 04/22/2018 1431   HDL 45 04/22/2018 1431   CHOLHDL 3.1 04/22/2018 1431   CHOLHDL 3.8 01/14/2016 2308   VLDL 37 01/14/2016 2308   LDLCALC 65 04/22/2018 1431   LDLDIRECT 62.5 12/01/2011 0832    Clinical Atherosclerotic Cardiovascular Disease (ASCVD): Yes  The ASCVD Risk score (Arnett DK, et al., 2019) failed to calculate for the following reasons:   Cannot find a previous HDL lab   Cannot find a previous total cholesterol lab   A/P: Diabetes longstanding currently improved control with no  episodes of hypoglycemia. Patient is  able to verbalize appropriate hypoglycemia management plan. Medication adherence appears appopropriate, however has not been able to start Bolus mealtime insulin due to finances.  -Increased dose of basal insulin Lantus (insulin glargine)\ from 15 to 18 units daily.   When able to afford rapid insulin Novolog (insulin aspart), start 5 units prior to largest meal once daily (and reduce Lantus to 15 units daily.  -Extensively  discussed pathophysiology of diabetes, recommended lifestyle interventions, dietary effects on blood sugar control.   Hypertension longstanding currently uncontrolled. Blood pressure goal of <130 mmHg. Medication adherence good. Blood pressure control is suboptimal due to lack of adequate regimen. -Continue current amlodipine '10mg'$  daily and carvedilol '25mg'$  BID - Re-Start Losartan '25mg'$  once daily  Urticaria - bilateral lower extremities - seen by Dr. Andria Frames, Attending physician.  - Started Triamcinolone 0.1% Ointment - apply 1-2x daily   Written patient instructions provided. Patient verbalized understanding of treatment plan.  Total time in face to face counseling 38 minutes.     Follow-up:  Pharmacist f/u phone call in 2 weeks. Patient seen with Joseph Art, PharmD, PGY2 Pharmacy Resident.  Marland Kitchen

## 2021-10-24 NOTE — Patient Instructions (Signed)
Start Triamcinolone ointment on legs Twice daily.  Increase Lantus to 18 units daily.  When Novolog available - Start 5 units with largest meal of the day and reduce Lantus to 15 units daily.   Restart Losartan '25mg'$  daily.   Call [facility] for potential qualification.

## 2021-10-24 NOTE — Addendum Note (Signed)
Addended by: Leavy Cella on: 10/24/2021 05:27 PM   Modules accepted: Orders

## 2021-11-06 ENCOUNTER — Ambulatory Visit: Payer: Medicare HMO

## 2021-11-06 NOTE — Chronic Care Management (AMB) (Signed)
   RN Case Manager Care Management   Phone Outreach    11/06/2021 Name: [name] [name] MRN: [id number] DOB: 1950-07-21  [name] [name] is a 71 y.o. year old female who is a primary care patient of Zola Button, MD .   I spoke with [name] and she informed me that she had a restless night and asked if we could reschedule our call. As a result, we have postponed our conversation to November 11, 2021.  Follow Up Plan: CM RN will follow up with the patient at he next scheduled Interval.   Review of patient status, including review of consultants reports, relevant laboratory and other test results, and collaboration with appropriate care team members and the patient's provider was performed as part of comprehensive patient evaluation and provision of care management services.    Lazaro Arms RN, BSN, [facility] Care Management Coordinator [place] Phone: [phone] Fax: [phone]

## 2021-11-07 ENCOUNTER — Telehealth: Payer: Self-pay | Admitting: Pharmacist

## 2021-11-07 DIAGNOSIS — E1142 Type 2 diabetes mellitus with diabetic polyneuropathy: Secondary | ICD-10-CM

## 2021-11-07 NOTE — Telephone Encounter (Signed)
Phone call to patient to follow-up on recent insulin adjustment/change in therapy.    Patient reports she was able to afford and purchased the novolog mealtime insulin.  She has started 5 units with her evening meal.  Denies any episodes of hypoglycemia.  Reports improved overall blood sugar control since change.   Scheduled appointment for 1 month with me to evaluate control.  Total time in counseling 9 minutes.

## 2021-11-07 NOTE — Assessment & Plan Note (Signed)
Phone call to patient to follow-up on recent insulin adjustment/change in therapy.    Patient reports she was able to afford and purchased the novolog mealtime insulin.  She has started 5 units with her evening meal.  Denies any episodes of hypoglycemia.  Reports improved overall blood sugar control since change.

## 2021-11-07 NOTE — Telephone Encounter (Signed)
Noted and agree. 

## 2021-11-07 NOTE — Telephone Encounter (Signed)
-----   Message from Leavy Cella, [place] sent at 10/24/2021  4:10 PM EDT ----- Regarding: F/U on 7/6 visit -

## 2021-11-11 ENCOUNTER — Telehealth: Payer: Medicare HMO

## 2021-11-11 ENCOUNTER — Other Ambulatory Visit: Payer: Self-pay

## 2021-11-11 ENCOUNTER — Telehealth: Payer: Self-pay

## 2021-11-11 MED ORDER — METOCLOPRAMIDE HCL 5 MG PO TABS: 5.0000 mg | ORAL_TABLET | Freq: Three times a day (TID) | ORAL | 0 refills | Status: DC

## 2021-11-11 NOTE — Progress Notes (Signed)
Rec'd refill request via fax for Reglan- Sent 30 days worth. Patient has appointment in August - needs to keep for further refills

## 2021-11-11 NOTE — Telephone Encounter (Signed)
   RN Case Manager Care Management   Phone Outreach    11/11/2021 Name: [name] [name] MRN: [id number] DOB: [birth year]  [name] [name] is a 71 y.o. year old female who is a primary care patient of Zola Button, MD .   Telephone outreach was unsuccessful  Follow Up Plan: Will route chart to Care Guide to see if patient would like to reschedule phone appointment.    Review of patient status, including review of consultants reports, relevant laboratory and other test results, and collaboration with appropriate care team members and the patient's provider was performed as part of comprehensive patient evaluation and provision of care management services.    Lazaro Arms RN, BSN, [facility] Care Management Coordinator [place] Phone: [phone] Fax: [phone]

## 2021-11-14 ENCOUNTER — Telehealth: Payer: Self-pay | Admitting: *Deleted

## 2021-11-14 NOTE — Chronic Care Management (AMB) (Signed)
  Care Coordination Note  11/14/2021 Name: Helga Asbury MRN: [id number] DOB: 1950-08-06  [name] [name] is a 71 y.o. year old female who is a primary care patient of Zola Button, MD and is actively engaged with the care management team. I reached out to [facility] by phone today to assist with re-scheduling a follow up visit with the RN Case Manager  Follow up plan: Unsuccessful telephone outreach attempt made. A HIPAA compliant phone message was left for the patient providing contact information and requesting a return call.  The care management team will reach out to the patient again over the next 7 days.  If patient returns call to provider office, please advise to call [place] at [phone].  [place]  Direct Dial: [phone]

## 2021-11-26 ENCOUNTER — Ambulatory Visit: Payer: Self-pay | Admitting: Licensed Clinical Social Worker

## 2021-11-26 NOTE — Patient Instructions (Signed)
Visit Information  Instructions:   Patient was given the following information about care management and care coordination services today, agreed to services, and gave verbal consent: 1.care management/care coordination services include personalized support from designated clinical staff supervised by their physician, including individualized plan of care and coordination with other care providers 2. 24/7 contact phone numbers for assistance for urgent and routine care needs. 3. The patient may stop care management/care coordination services at any time by phone call to the office staff.  Patient verbalizes understanding of instructions and care plan provided today and agrees to view in MyChart. Active MyChart status and patient understanding of how to access instructions and care plan via MyChart confirmed with patient.     No further follow up required: .  Teandre Hamre, BSW , MSW Social Worker [facility] Care Management  [phone]      

## 2021-11-26 NOTE — Patient Outreach (Signed)
  Care Coordination   Follow Up Visit Note   11/26/2021 Name: [name] [name] MRN: [id number] DOB: 1950-10-06  [name] [name] is a 71 y.o. year old female who sees [name] Button, MD for primary care. I spoke with  [name] [name] by phone today  What matters to the patients health and wellness today?  Successful outreach to patient. SW addressed all patients concerns. Patient denied needing assistance from [facility]. Patient denied needing any further assistance or interventions. SW removed self off of care team and advised patient to contact [facility] clinic if further assistance is needed.  SDOH assessments and interventions completed:  Yes     Care Coordination Interventions Activated:  Yes  Care Coordination Interventions:  Yes, provided   Follow up plan: No further intervention required.   Encounter Outcome:  Pt. Visit Completed   Lenor Derrick, MSW  Social Worker [facility] Care Management  [phone]

## 2021-12-02 NOTE — Chronic Care Management (AMB) (Signed)
  Care Coordination Note  12/02/2021 Name: Carey Lafon MRN: [id number] DOB: 1950/08/21  Roneka Gilpin is a 71 y.o. year old female who is a primary care patient of Zola Button, MD and is actively engaged with the care management team. I reached out to [facility] by phone today to assist with scheduling a follow up visit with the RN Case Manager  Follow up plan: Telephone appointment with care management team member scheduled for:12/04/21  Jaila Schellhorn  Care Coordination Care Guide  Direct Dial: [phone]

## 2021-12-03 DIAGNOSIS — R6889 Other general symptoms and signs: Secondary | ICD-10-CM | POA: Diagnosis not present

## 2021-12-04 ENCOUNTER — Ambulatory Visit: Payer: Medicare HMO

## 2021-12-05 NOTE — Patient Instructions (Signed)
Visit Information  [name] [name]  it was nice speaking with you. Please call me directly [phone] if you have questions about the goals we discussed.  Patient Goals/Self Care Activities: -Patient/Caregiver will self-administer medications as prescribed as evidenced by self-report/primary caregiver report  -Patient/Caregiver will attend all scheduled provider appointments as evidenced by clinician review of documented attendance to scheduled appointments and patient/caregiver report -Patient/Caregiver will call pharmacy for medication refills as evidenced by patient report and review of pharmacy fill history as appropriate -Patient/Caregiver will call provider office for new concerns or questions as evidenced by review of documented incoming telephone call notes and patient report -Patient/Caregiver verbalizes understanding of plan -Patient/Caregiver will focus on medication adherence by taking medications as prescribed  -check blood sugar at prescribed times -check blood sugar if I feel it is too high or too low -record values and write them down take them to all doctor visits   -Avoid smoke and air pollution -Keep your airway clear from mucus build up  -Control your cough by drinking plenty of water -Self assess COPD action plan zone and make appointment with provider if you have been in the yellow zone for 48 hours without improvement.    The patient verbalized understanding of instructions, educational materials, and care plan provided today and agreed to receive a mailed copy of patient instructions, educational materials, and care plan.   Follow up Plan: Patient would like continued follow-up.  CM RNCM will outreach the patient within the next 4 weeks  Patient will call office if needed prior to next encounter  Lazaro Arms, RN  [phone]

## 2021-12-05 NOTE — Chronic Care Management (AMB) (Signed)
RNCM Care Management Note 12/05/2021 Name: [name] [name] MRN: [id number] DOB: Oct 31, 1950   [name] [name] is a 71 y.o. year old female who sees [name] Button, MD for primary care. RNCM was consulted  to assistance patient with  Care Coordination.      Engaged with patient by telephone for follow up visit in response to provider referral for Care Coordination.     Summary:  The  continues to experience difficulty with elevated blood sugars.. See Care Plan below for interventions and patient self-care actives.  Recommendation: The patient may benefit from taking medications as prescribed, monitoring food intake, watch fluid intake, checking Blood sugar and record values, calling your physician if numbers are abnormal, and The patient agrees.  Follow up Plan: Patient would like continued follow-up.  CM RNCM will outreach the patient within the next 4 weeks.  Patient will call office if needed prior to next encounter   SDOH (Social Determinants of Health) screening and interventions performed today: No     RN Plan of Care for Conditions/Un Met Needs Addressed and Monitored  Patient Care Plan: RN Case Manager     Problem Identified: General Plan of Care in a patient with Diabetes andd COPD Resolved 12/05/2021     Long-Range Goal: The patiednt will learn skills tto monitor and manage her chronic conditions to prevent admission to the hospital and keep her levels at a normal range. Completed 12/05/2021  Start Date: 08/21/2021  Expected End Date: 01/19/2023  Priority: High  Note:   Current Barriers:  Chronic Disease Management support and education needs related to COPD and DMII  RNCM Clinical Goal(s):  Patient will verbalize basic understanding of COPD and DMII disease process and self health management plan as evidenced by preventing hospital admissions for 30 days and maintaining acceptable levels  through collaboration with RN Care manager, provider, and care team.    Interventions: 1:1 collaboration with primary care provider regarding development and update of comprehensive plan of care as evidenced by provider attestation and co-signature Inter-disciplinary care team collaboration (see longitudinal plan of care) Evaluation of current treatment plan related to  self management and patient's adherence to plan as established by provider   COPD: (Status: Goal on Track (progressing): YES.) Long Term Goal  Reviewed medications with patient, including use of prescribed maintenance and rescue inhalers, and provided instruction on medication management and the importance of adherence Provided patient with basic written and verbal COPD education on self care/management/and exacerbation prevention Advised patient to track and manage COPD triggers Advised patient to self assesses COPD action plan zone and make appointment with provider if in the yellow zone for 48 hours without improvement Provided education about and advised patient to utilize infection prevention strategies to reduce risk of respiratory infection Discussed the importance of adequate rest and management of fatigue with COPD  Diabetes:  (Status: New goal.) Long Term Goal   Lab Results  Component Value Date   HGBA1C 8.3 (H) 09/20/2021   @ Assessed patient's understanding of A1c goal: <7% Provided education to patient about basic DM disease process; Reviewed medications with patient and discussed importance of medication adherence;        Counseled on importance of regular laboratory monitoring as prescribed;        Discussed plans with patient for ongoing care management follow up and provided patient with direct contact information for care management team;      Referral made to social work team for assistance with Anxiety stress and depression.  Message sent to PCP for new glucometer   Review of patient status, including review of consultants reports, relevant laboratory and other test  results, and medications completed;     12/04/21:  [name] is managing her breathing well by avoiding heat and using inhalers as prescribed. However, her blood sugar levels are slightly elevated (190-220) due to her sugary tea consumption and diet. We discussed monitoring her food and drinks, and she expressed an intent to improve.  Patient Goals/Self Care Activities: -Patient/Caregiver will self-administer medications as prescribed as evidenced by self-report/primary caregiver report  -Patient/Caregiver will attend all scheduled provider appointments as evidenced by clinician review of documented attendance to scheduled appointments and patient/caregiver report -Patient/Caregiver will call pharmacy for medication refills as evidenced by patient report and review of pharmacy fill history as appropriate -Patient/Caregiver will call provider office for new concerns or questions as evidenced by review of documented incoming telephone call notes and patient report -Patient/Caregiver verbalizes understanding of plan -Patient/Caregiver will focus on medication adherence by taking medications as prescribed  -check blood sugar at prescribed times -check blood sugar if I feel it is too high or too low -record values and write them down take them to all doctor visits   -Avoid smoke and air pollution -Keep your airway clear from mucus build up  -Control your cough by drinking plenty of water -Self assess COPD action plan zone and make appointment with provider if you have been in the yellow zone for 48 hours without improvement.   Resolving due to duplicate goal             Lazaro Arms RN, BSN, [facility] Care Management Coordinator [facility]   Phone: [phone]

## 2021-12-10 ENCOUNTER — Other Ambulatory Visit: Payer: Self-pay

## 2021-12-10 MED ORDER — HYDROXYZINE HCL 10 MG PO TABS: 10.0000 mg | ORAL_TABLET | Freq: Three times a day (TID) | ORAL | 0 refills | Status: DC | PRN

## 2021-12-12 ENCOUNTER — Ambulatory Visit: Payer: Medicare HMO | Admitting: Gastroenterology

## 2021-12-12 ENCOUNTER — Ambulatory Visit: Payer: Medicare HMO | Admitting: Pharmacist

## 2021-12-16 ENCOUNTER — Ambulatory Visit: Payer: Medicare HMO | Admitting: Pharmacist

## 2021-12-24 ENCOUNTER — Ambulatory Visit: Payer: Medicare HMO | Admitting: Pharmacist

## 2022-01-07 ENCOUNTER — Ambulatory Visit: Payer: Self-pay

## 2022-01-07 NOTE — Patient Instructions (Signed)
Visit Information  Thank you for taking time to visit with me today. Please don't hesitate to contact me if I can be of assistance to you.   Following are the goals we discussed today:   Goals Addressed             This Visit's Progress    I want to learn how to control my DM COPD and ease my back pain       Care Coordination Interventions: Provided instruction about proper use of medications used for management of COPD including inhalers Discussed the importance of adequate rest and management of fatigue with COPD Reviewed medications with patient and discussed importance of medication adherence Discussed plans with patient for ongoing care management follow up and provided patient with direct contact information for care management team Review of patient status, including review of consultants reports, relevant laboratory and other test results, and medications completed Diet Discussed use of relaxation techniques and/or diversional activities to assist with pain reduction (distraction, imagery, relaxation, massage, acupressure, TENS, heat, and cold application Reviewed with patient prescribed pharmacological and nonpharmacological pain relief strategies Active listening / Reflection utilized  Emotional Support Provided Problem [place] strategies reviewed During the appointment, the patient reported that she is doing well but has some sinus drainage. She mentioned that Flonase does not help in alleviating the problem. I advised her against swallowing any drainage and suggested that she drink hot tea to clear up the fluids. The patient further informed me that her blood sugar levels have been high, ranging in the 200s. We discussed the importance of monitoring her diet and taking her medications regularly. Regarding the pain in her back, the patient rated it at 7 out of 10, describing it as nagging. As she had not yet taken her gabapentin, she used [employer] salt to soak  and ease the pain. I also suggested using a heating pad for relief.        Our next appointment is by telephone on 10/19 at 1130 am  Please call the care guide team at [phone] if you need to cancel or reschedule your appointment.   If you are experiencing a Mental Health or Hydro or need someone to talk to, please call 1-800-273-TALK (toll free, 24 hour hotline)  Patient verbalizes understanding of instructions and care plan provided today and agrees to view in [facility]. Active MyChart status and patient understanding of how to access instructions and care plan via MyChart confirmed with patient.     Lazaro Arms RN, BSN, [facility]   Phone: [phone]

## 2022-01-07 NOTE — Patient Outreach (Signed)
  Care Coordination   Follow Up Visit Note   01/07/2022 Name: [name] [name] MRN: [id number] DOB: Mar 15, 1951  [name] [name] is a 71 y.o. year old female who sees Zola Button, MD for primary care. I spoke with  [name] by phone today.  What matters to the patients health and wellness today?  My back hurts    Goals Addressed             This Visit's Progress    I want to learn how to control my DM COPD and ease my back pain       Care Coordination Interventions: Provided instruction about proper use of medications used for management of COPD including inhalers Discussed the importance of adequate rest and management of fatigue with COPD Reviewed medications with patient and discussed importance of medication adherence Discussed plans with patient for ongoing care management follow up and provided patient with direct contact information for care management team Review of patient status, including review of consultants reports, relevant laboratory and other test results, and medications completed Diet Discussed use of relaxation techniques and/or diversional activities to assist with pain reduction (distraction, imagery, relaxation, massage, acupressure, TENS, heat, and cold application Reviewed with patient prescribed pharmacological and nonpharmacological pain relief strategies Active listening / Reflection utilized  Emotional Support Provided Problem [place] strategies reviewed During the appointment, the patient reported that she is doing well but has some sinus drainage. She mentioned that Flonase does not help in alleviating the problem. I advised her against swallowing any drainage and suggested that she drink hot tea to clear up the fluids. The patient further informed me that her blood sugar levels have been high, ranging in the 200s. We discussed the importance of monitoring her diet and taking her medications regularly. Regarding the pain in her back,  the patient rated it at 7 out of 10, describing it as nagging. As she had not yet taken her gabapentin, she used [employer] salt to soak and ease the pain. I also suggested using a heating pad for relief.        SDOH assessments and interventions completed:  No     Care Coordination Interventions Activated:  Yes  Care Coordination Interventions:  Yes, provided   Follow up plan: Follow up call scheduled for 10/19 1130 am    Encounter Outcome:  Pt. Visit Completed   Lazaro Arms RN, BSN, [facility]   Phone: [phone]

## 2022-01-16 ENCOUNTER — Ambulatory Visit (INDEPENDENT_AMBULATORY_CARE_PROVIDER_SITE_OTHER): Payer: Medicare HMO | Admitting: Pharmacist

## 2022-01-16 ENCOUNTER — Encounter: Payer: Self-pay | Admitting: Family Medicine

## 2022-01-16 ENCOUNTER — Encounter: Payer: Self-pay | Admitting: Pharmacist

## 2022-01-16 ENCOUNTER — Ambulatory Visit (INDEPENDENT_AMBULATORY_CARE_PROVIDER_SITE_OTHER): Payer: Medicare HMO | Admitting: Family Medicine

## 2022-01-16 VITALS — BP 186/82 | HR 76 | Ht 63.0 in

## 2022-01-16 VITALS — BP 174/69 | HR 77 | Ht 64.0 in | Wt 205.4 lb

## 2022-01-16 DIAGNOSIS — E1142 Type 2 diabetes mellitus with diabetic polyneuropathy: Secondary | ICD-10-CM

## 2022-01-16 DIAGNOSIS — Z23 Encounter for immunization: Secondary | ICD-10-CM | POA: Diagnosis not present

## 2022-01-16 DIAGNOSIS — J329 Chronic sinusitis, unspecified: Secondary | ICD-10-CM

## 2022-01-16 DIAGNOSIS — E1143 Type 2 diabetes mellitus with diabetic autonomic (poly)neuropathy: Secondary | ICD-10-CM

## 2022-01-16 DIAGNOSIS — M4807 Spinal stenosis, lumbosacral region: Secondary | ICD-10-CM

## 2022-01-16 DIAGNOSIS — I152 Hypertension secondary to endocrine disorders: Secondary | ICD-10-CM

## 2022-01-16 DIAGNOSIS — E1159 Type 2 diabetes mellitus with other circulatory complications: Secondary | ICD-10-CM | POA: Diagnosis not present

## 2022-01-16 DIAGNOSIS — J449 Chronic obstructive pulmonary disease, unspecified: Secondary | ICD-10-CM | POA: Diagnosis not present

## 2022-01-16 DIAGNOSIS — K3184 Gastroparesis: Secondary | ICD-10-CM | POA: Diagnosis not present

## 2022-01-16 DIAGNOSIS — M5417 Radiculopathy, lumbosacral region: Secondary | ICD-10-CM

## 2022-01-16 DIAGNOSIS — W19XXXA Unspecified fall, initial encounter: Secondary | ICD-10-CM | POA: Diagnosis not present

## 2022-01-16 DIAGNOSIS — R6889 Other general symptoms and signs: Secondary | ICD-10-CM | POA: Diagnosis not present

## 2022-01-16 LAB — POCT GLYCOSYLATED HEMOGLOBIN (HGB A1C): HbA1c, POC (controlled diabetic range): 10.1 % — AB (ref 0.0–7.0)

## 2022-01-16 MED ORDER — LOSARTAN POTASSIUM 25 MG PO TABS: 25.0000 mg | ORAL_TABLET | Freq: Every day | ORAL | 3 refills | Status: DC

## 2022-01-16 MED ORDER — FLUNISOLIDE 25 MCG/ACT (0.025%) NA SOLN: 2.0000 | Freq: Two times a day (BID) | NASAL | 2 refills | Status: DC

## 2022-01-16 MED ORDER — AMLODIPINE BESYLATE 10 MG PO TABS: ORAL_TABLET | ORAL | 9 refills | Status: DC

## 2022-01-16 MED ORDER — LANTUS SOLOSTAR 100 UNIT/ML ~~LOC~~ SOPN: 22.0000 [IU] | PEN_INJECTOR | Freq: Every day | SUBCUTANEOUS | 0 refills | Status: DC

## 2022-01-16 MED ORDER — METOCLOPRAMIDE HCL 5 MG PO TABS: 5.0000 mg | ORAL_TABLET | Freq: Three times a day (TID) | ORAL | 0 refills | Status: DC

## 2022-01-16 MED ORDER — GABAPENTIN 300 MG PO CAPS: 600.0000 mg | ORAL_CAPSULE | Freq: Two times a day (BID) | ORAL | 2 refills | Status: DC | PRN

## 2022-01-16 MED ORDER — FREESTYLE LIBRE 2 SENSOR MISC: 11 refills | Status: DC

## 2022-01-16 MED ORDER — ONDANSETRON HCL 4 MG PO TABS: ORAL_TABLET | ORAL | 0 refills | Status: DC

## 2022-01-16 MED ORDER — LANTUS SOLOSTAR 100 UNIT/ML ~~LOC~~ SOPN: 12.0000 [IU] | PEN_INJECTOR | Freq: Two times a day (BID) | SUBCUTANEOUS | Status: DC

## 2022-01-16 MED ORDER — NOVOLOG FLEXPEN 100 UNIT/ML ~~LOC~~ SOPN: 10.0000 [IU] | PEN_INJECTOR | Freq: Every day | SUBCUTANEOUS | 3 refills | Status: DC

## 2022-01-16 MED ORDER — DICLOFENAC SODIUM 1 % EX GEL: 4.0000 g | Freq: Four times a day (QID) | CUTANEOUS | 0 refills | Status: DC | PRN

## 2022-01-16 NOTE — Progress Notes (Signed)
Reviewed: I agree with Dr. Koval's documentation and management. 

## 2022-01-16 NOTE — Assessment & Plan Note (Signed)
Diabetes longstanding currently above goal based on A1c. Patient is able to verbalize appropriate hypoglycemia management plan. Medication adherence appears appropriate. Control is suboptimal due to post prandial excursion with concurrent nocturnal hypoglycemia. -Decreased dose of basal insulin Latnus (insulin glargine) from 15 units BID to 12 units QAM due to symptomatic hypoglycemia. -Increased dose of rapid insulin Novolog (insulin aspart) from 5 units to 10 units with lunch.  -Sent in prescription for FreeStyle [place] 2 sensor. Patients granddaughter also uses [employer] and is willing to help patient set it up.  -Extensively discussed pathophysiology of diabetes, recommended lifestyle interventions, dietary effects on blood sugar control.  -Counseled on s/sx of and management of hypoglycemia.  -Next A1c anticipated today with PCP.

## 2022-01-16 NOTE — Patient Instructions (Addendum)
It was nice seeing you today!  Use Voltaren gel as needed up to 4 times a day on your knees.  Increase your gabapentin to 300 mg 1-2 tablets, twice a day as needed.  [facility] Address: [address], [place], [place] [address] Phone: [phone]   Return in 3 months. Let me know if your knee pain is not getter in the next 1-2 months.  Stay well, Zola Button, MD [place] [phone]  --  Make sure to check out at the front desk before you leave today.  Please arrive at least 15 minutes prior to your scheduled appointments.  If you had blood work today, I will send you a MyChart message or a letter if results are normal. Otherwise, I will give you a call.  If you had a referral placed, they will call you to set up an appointment. Please give [place] a call if you don't hear back in the next 2 weeks.  If you need additional refills before your next appointment, please call your pharmacy first.

## 2022-01-16 NOTE — Progress Notes (Signed)
S:    Chief Complaint  Patient presents with   Medication Management    Diabetes   [name] [name] is a 71 y.o. female who presents for diabetes evaluation, education, and management.  PMH is significant for diabetes, hypertension, tobacco abuse and COPD. Patient was referred and last seen by Primary Care Provider, Dr. Nancy Fetter on 09/11/2021. Patient was last seen by the pharmacy clinic on 10/24/21.  At last visit, Lantus (insulin glargine) was increased from 15 to 18 units BID until she was able to afford a refill on her Novolog (insulin aspart), then she reduced Lantus (insulin glargine) back to 15 units BID. Losartan 25 mg was restarted. However, patient did not begin taking.   Today, patient arrives in good spirits and presents with the assistance of a cane.   Current diabetes medications include: Lantus (insulin glargine) 15 units BID, Novolog (insulin aspart) 5 units with lunch  Current hypertension medications include: amlodipine 10 mg daily (has been out of for a few days), carvedilol 25 mg BID  Patient reports adherence to taking all medications as prescribed.   Insurance coverage: [employer]  Patient reports hypoglycemic events. Endorses symptoms of shakiness and sweaty. Occurs 1-2x/week, wakes her up during night.   Reported home fasting blood sugars: mostly 230-240s, some 100s  Reported 2 hour post-meal/random blood sugars: 250-270 after eating sugar/carbs; low if she eats veggies  Patient reports nocturia (nighttime urination). 3-4x/night  Patient reports neuropathy (nerve pain). Patient reports visual changes. Patient reports self foot exams.  Patient reported dietary habits: Eats 1 meals/day Breakfast: usually skips, coffee Lunch: squash, zucchini, tuna, corn beef Snacks: dry cereal  Drinks: Tea, coffee, OJ, water.  O:  Review of Systems  Eyes:  Positive for blurred vision.  All other systems reviewed and are negative.   Physical Exam Constitutional:       Appearance: Normal appearance.  Pulmonary:     Effort: Pulmonary effort is normal.  Neurological:     Mental Status: She is alert.  Psychiatric:        Mood and Affect: Mood normal.        Behavior: Behavior normal.    Lab Results  Component Value Date   HGBA1C 10.1 (A) 01/16/2022   Vitals:   01/16/22 1006  BP: (!) 174/69  Pulse: 77  SpO2: 97%   Lipid Panel     Component Value Date/Time   CHOL 141 04/22/2018 1431   TRIG 156 (H) 04/22/2018 1431   HDL 45 04/22/2018 1431   CHOLHDL 3.1 04/22/2018 1431   CHOLHDL 3.8 01/14/2016 2308   VLDL 37 01/14/2016 2308   LDLCALC 65 04/22/2018 1431   LDLDIRECT 62.5 12/01/2011 0832    A/P: Diabetes longstanding currently above goal based on A1c. Patient is able to verbalize appropriate hypoglycemia management plan. Medication adherence appears appropriate. Control is suboptimal due to post prandial excursion with concurrent nocturnal hypoglycemia. -Decreased dose of basal insulin Latnus (insulin glargine) from 15 units BID to 12 units QAM due to symptomatic hypoglycemia. -Increased dose of rapid insulin Novolog (insulin aspart) from 5 units to 10 units with lunch.  -Sent in prescription for FreeStyle [place] 2 sensor. Patients granddaughter also uses [employer] and is willing to help patient set it up.  -Extensively discussed pathophysiology of diabetes, recommended lifestyle interventions, dietary effects on blood sugar control.  -Counseled on s/sx of and management of hypoglycemia.  -Next A1c anticipated today with PCP.   Hypertension longstanding, currently above goal. Blood pressure goal of <  130/80 mmHg. Medication adherence not reported as patient did not refill on amlodipine and did not start losartan. Blood pressure control is suboptimal due to not having medications. -Restart amlodipine 10 mg daily -Restart losartan 25 mg daily  Written patient instructions provided. Patient verbalized understanding of treatment plan.  Total  time in face to face counseling 30 minutes.    Follow-up:  Pharmacist PRN. PCP clinic visit today.  Patient seen with Martina Sinner, PharmD Candidate, Jeneen Rinks,  PharmD PGY-1 Resident, and Joseph Art, PharmD, PGY2 Pharmacy Resident.

## 2022-01-16 NOTE — Patient Instructions (Addendum)
It was nice to see you today!  Your goal blood sugar is 80-130 before eating and less than 180 after eating.  Medication Changes: Decrease Lantus (insulin glargine) to 12 units BID Increase Humalog (insulin Novolog) to 10 units with your largest meal  Start losartan 25 mg daily.   Monitor blood sugars at home and keep a log (glucometer or piece of paper) to bring with you to your next visit.  Keep up the good work with diet and exercise. Aim for a diet full of vegetables, fruit and lean meats (chicken, [place], fish). Try to limit salt intake by eating fresh or frozen vegetables (instead of canned), rinse canned vegetables prior to cooking and do not add any additional salt to meals.      Sensor Application If using the App, you can tap Help in the Main Menu to access an in-app tutorial on applying a Sensor. See below for instructions on how to download the app. Apply Sensors only on the back of your upper arm. If placed in other areas, the Sensor may not function properly and could give you inaccurate readings. Avoid areas with scars, moles, stretch marks, or lumps.   Select an area of skin that generally stays flat during your normal daily activities (no bending or folding). Choose a site that is at least 1 inch (2.5 cm) away from any injection sites. To prevent discomfort or skin irritation, you should select a different site other than the one most recently used. Wash application site using a plain soap, dry, and then clean with an alcohol wipe. This will help remove any oily residue that may prevent the sensor from sticking properly. Allow site to air dry before proceeding. Note: The area MUST be clean and dry, or the Sensor may not stay on for the full wear duration specified by your Sensor insert. 4. Unscrew the cap from the Sensor Applicator and set the cap aside.  5. Place the Sensor Applicator over the prepared site and push down firmly to apply the Sensor to your body. 6. Gently  pull the Sensor Applicator away from your body. The Sensor should now be attached to your skin. 7. Make sure the Sensor is secure after application. Put the cap back on the Sensor Applicator. Discard the used Engineer, civil (consulting) according to local regulations.  What If My Sensor Falls Off or What If My Sensor Isn't Working? Call [place] Team at [phone] Available 7 days a week from 8AM-8PM EST, excluding holidays   The App Download the FreeStyle [place] 2 App in your phone's app store  Load the app and select get started now Create an account  Tap scan new sensor Follow the prompts on the screen. If your sensor does not sync, try moving your phone slowly around the sensor. Phone cases may affect scanning. This will be the only time you have to scan the sensor until you apply a new sensor in 14 days.  There will be a 60 minute start up period until the app will display your glucose reading

## 2022-01-16 NOTE — Progress Notes (Signed)
SUBJECTIVE:   CHIEF COMPLAINT / HPI:  Chief Complaint  Patient presents with   Abdominal Pain   Knee Pain   referral    Patient seen by Dr. Valentina Lucks earlier today in pharmacy clinic.  She will be set up for CGM.  He made some adjustments of her insulin regimen and antihypertensive agents.  Fell 2 weeks ago at her granddaughter's school, landed onto both her knees. Thinks she may have been knocked over by something, thinks maybe the wind? Able to walk OK since then. Slowly improving but still having pain in both of her knees, worse on the left as well as worsening of her chronic bilateral hip pain.  Has had some morning and evening stiffness that improves within 30 minutes of her knees.  No prior issues with her knees.  Taking Tylenol at home.  Denies bowel/bladder incontinence, leg weakness, saddle anesthesia.  She is concerned about a fracture and would like x-rays.  Patient also needs a refill of her metoclopramide and ondansetron.  Patient has had chronic abdominal pain and nausea which do improve with metoclopramide though she has run out.  She also wants to try different nasal spray as Flonase is not working as well recently.  PERTINENT  PMH / PSH: HTN, COPD, GERD, T2DM, HLD, spinal stenosis  Patient Care Team: Zola Button, MD as PCP - General (Family Medicine) Debara Pickett, Nadean Corwin, MD as PCP - Cardiology (Cardiology) [name], MD as Consulting Physician (Gastroenterology) Warden Fillers, MD as Consulting Physician (Ophthalmology) Lazaro Arms, RN as [place] Management   OBJECTIVE:   BP (!) 186/82   Pulse 76   Ht '5\' 3"'$  (1.6 m)   SpO2 98%   BMI 36.38 kg/m   Physical Exam Constitutional:      General: She is not in acute distress.    Appearance: She is obese.  HENT:     Head: Normocephalic and atraumatic.  Cardiovascular:     Rate and Rhythm: Normal rate and regular rhythm.  Pulmonary:     Effort: Pulmonary effort is normal. No  respiratory distress.     Breath sounds: Normal breath sounds.  Musculoskeletal:     Cervical back: Neck supple.     Comments: No obvious deformity or swelling of the knees bilaterally.  There is mild tenderness to palpation just inferior to the patella bilateral.  Full flexion and extension of the knees though with some discomfort.  Strength is preserved.  Negative varus/valgus stressing, negative Lachman, negative posterior drawer bilateral.  Straight leg raise is positive bilateral, symptoms noted with minimal flexion on the left at approximately 20 to 30 degrees, symptoms noted with flexion to approximately 60 degrees on the right.  There is mild discomfort elicited with internal rotation of the left hip, otherwise normal ROM.  Skin:    General: Skin is warm and dry.  Neurological:     Mental Status: She is alert.         09/11/2021    3:28 PM  Depression screen PHQ 2/9  Decreased Interest 2  Down, Depressed, Hopeless 1  PHQ - 2 Score 3  Altered sleeping 3  Tired, decreased energy 1  Change in appetite 1  Feeling bad or failure about yourself  0  Trouble concentrating 1  Moving slowly or fidgety/restless 1  Suicidal thoughts 0  PHQ-9 Score 10  Difficult doing work/chores Very difficult     {Show previous vital signs (optional):23777}  Last hemoglobin A1c Lab Results  Component Value Date   HGBA1C 10.1 (A) 01/16/2022      ASSESSMENT/PLAN:   Fall, initial encounter Ground-level fall onto knees 2 weeks ago.  Low suspicion for fracture based on exam but will obtain imaging given trauma. - DG Knee Complete 4 Views Right; Future - DG Knee Complete 4 Views Left; Future - DG HIPS BILAT WITH PELVIS 3-4 VIEWS; Future  - diclofenac gel prn for knee pain  Spinal stenosis of lumbosacral region with radiculopathy Exacerbation of chronic bilateral hip and back pain from recent fall.  Diabetic neuropathy is confounding as well.  Imaging as above. - increase gabapentin to  300-600 mg BID prn  DM type 2 with diabetic peripheral neuropathy (HCC) Worsening control.  Medications adjusted in pharmacy clinic today, plan per Dr. Valentina Lucks: -Decreased Lantus from 15 units BID to 12 units QAM due to symptomatic hypoglycemia. -Increased Novolog from 5 units to 10 units with lunch.  -Sent in prescription for FreeStyle [place] 2  Hypertension associated with diabetes ([place]) Uncontrolled due to medication non-adherence. Plan per Dr. Valentina Lucks below: - Restart amlodipine 10 mg daily - Restart losartan 25 mg daily - continue carvedilol 25 mg BID  Diabetic gastroparesis associated with type 2 diabetes mellitus (HCC) Metoclopramide and ondansetron refilled, she has follow-up with GI next month  Chronic sinusitis Trial Nasalide, switch from Flonase.  Per patient request   HCM - flu shot given - PCV 20 given  Return in about 3 months (around 04/17/2022) for f/u DM.   Zola Button, MD [place]

## 2022-01-17 ENCOUNTER — Encounter: Payer: Self-pay | Admitting: Family Medicine

## 2022-01-17 NOTE — Assessment & Plan Note (Signed)
Metoclopramide and ondansetron refilled, she has follow-up with GI next month

## 2022-01-17 NOTE — Assessment & Plan Note (Signed)
Uncontrolled due to medication non-adherence. Plan per Dr. Valentina Lucks below: - Restart amlodipine 10 mg daily - Restart losartan 25 mg daily - continue carvedilol 25 mg BID

## 2022-01-17 NOTE — Assessment & Plan Note (Addendum)
Worsening control.  Medications adjusted in pharmacy clinic today, plan per Dr. Valentina Lucks: -Decreased Lantus from 15 units BID to 12 units QAM due to symptomatic hypoglycemia. -Increased Novolog from 5 units to 10 units with lunch.  -Sent in prescription for FreeStyle [place] 2

## 2022-01-17 NOTE — Assessment & Plan Note (Signed)
Trial Nasalide, switch from Flonase.  Per patient request

## 2022-01-17 NOTE — Assessment & Plan Note (Signed)
Exacerbation of chronic bilateral hip and back pain from recent fall.  Diabetic neuropathy is confounding as well.  Imaging as above. - increase gabapentin to 300-600 mg BID prn

## 2022-01-24 ENCOUNTER — Other Ambulatory Visit: Payer: Self-pay | Admitting: Family Medicine

## 2022-01-24 MED ORDER — HYDROXYZINE HCL 10 MG PO TABS: 10.0000 mg | ORAL_TABLET | Freq: Three times a day (TID) | ORAL | 0 refills | Status: DC | PRN

## 2022-01-24 NOTE — Telephone Encounter (Signed)
Pended medications to encounter.   Talbot Grumbling, RN

## 2022-01-24 NOTE — Telephone Encounter (Signed)
Patient called stating she is needing 2 medications refilled:  Hydroxyzine '10mg'$    Oxybutynin XL '10mg'$    She would like them sent to the [facility] on [facility].   Please Advise.  Thanks!

## 2022-02-06 ENCOUNTER — Telehealth: Payer: Self-pay

## 2022-02-06 NOTE — Patient Outreach (Signed)
  Care Coordination   02/06/2022 Name: [name] [name] MRN: [id number] DOB: Oct 30, 1950   Care Coordination Outreach Attempts:  An unsuccessful telephone outreach was attempted today to offer the patient information about available care coordination services as a benefit of their health plan.   Follow Up Plan:  Additional outreach attempts will be made to offer the patient care coordination information and services.   Encounter Outcome:  No Answer  Care Coordination Interventions Activated:  No   Care Coordination Interventions:   No, not indicated   Lazaro Arms RN, BSN, [place] [facility]   Phone: [phone]

## 2022-02-17 ENCOUNTER — Ambulatory Visit: Payer: Medicare HMO | Admitting: Gastroenterology

## 2022-02-24 ENCOUNTER — Ambulatory Visit (INDEPENDENT_AMBULATORY_CARE_PROVIDER_SITE_OTHER): Payer: Medicare HMO | Admitting: Family Medicine

## 2022-02-24 ENCOUNTER — Other Ambulatory Visit: Payer: Self-pay

## 2022-02-24 VITALS — BP 187/87 | HR 101 | Wt 178.0 lb

## 2022-02-24 DIAGNOSIS — J4489 Other specified chronic obstructive pulmonary disease: Secondary | ICD-10-CM

## 2022-02-24 DIAGNOSIS — J019 Acute sinusitis, unspecified: Secondary | ICD-10-CM

## 2022-02-24 DIAGNOSIS — B9689 Other specified bacterial agents as the cause of diseases classified elsewhere: Secondary | ICD-10-CM

## 2022-02-24 DIAGNOSIS — E1142 Type 2 diabetes mellitus with diabetic polyneuropathy: Secondary | ICD-10-CM

## 2022-02-24 MED ORDER — LANTUS SOLOSTAR 100 UNIT/ML ~~LOC~~ SOPN: 12.0000 [IU] | PEN_INJECTOR | Freq: Two times a day (BID) | SUBCUTANEOUS | 2 refills | Status: DC

## 2022-02-24 MED ORDER — AMOXICILLIN-POT CLAVULANATE 875-125 MG PO TABS: 1.0000 | ORAL_TABLET | Freq: Two times a day (BID) | ORAL | 0 refills | Status: DC

## 2022-02-24 MED ORDER — FLUNISOLIDE 25 MCG/ACT (0.025%) NA SOLN: 2.0000 | Freq: Two times a day (BID) | NASAL | 2 refills | Status: DC

## 2022-02-24 MED ORDER — PREDNISONE 20 MG PO TABS: 40.0000 mg | ORAL_TABLET | Freq: Every day | ORAL | 0 refills | Status: AC

## 2022-02-24 MED ORDER — GABAPENTIN 300 MG PO CAPS: 600.0000 mg | ORAL_CAPSULE | Freq: Two times a day (BID) | ORAL | 2 refills | Status: DC | PRN

## 2022-02-24 NOTE — Patient Instructions (Addendum)
It was nice seeing you today!  Take the steroids and antibiotics as prescribed.  Let us know if things are not getting better.  Stay well, Zola Button, MD [place] [phone]  --  Make sure to check out at the front desk before you leave today.  Please arrive at least 15 minutes prior to your scheduled appointments.  If you had blood work today, I will send you a MyChart message or a letter if results are normal. Otherwise, I will give you a call.  If you had a referral placed, they will call you to set up an appointment. Please give [place] a call if you don't hear back in the next 2 weeks.  If you need additional refills before your next appointment, please call your pharmacy first.

## 2022-02-24 NOTE — Progress Notes (Signed)
    SUBJECTIVE:   CHIEF COMPLAINT / HPI:  Chief Complaint  Patient presents with   Cough    Patient has been feeling ill for the past 3 weeks with productive cough of green phlegm, congestion, and headache.  Not eating as well, has been sticking with more clear liquid diet.  She had a fever of 103 F at home over the past weekend. She is still feeling feverish at night.  She has had some chest discomfort, unsure if related to coughing.  Breathing is okay, denies shortness of breath.  She never got the Nasalid nasal spray due to some issue at the pharmacy (reported was not sent in) so she has not been using any nasal sprays.  Reports home BP readings 160s-170s/90s. Reports her BP is always high when she is sick.  She reports good adherence to her medications.  Increased dose of gabapentin 600 mg has been helping, she needs a refill of this.  Also needs refill of her Lantus.  She is planned to go to [facility] for Thanksgiving and will be at home for Christmas and will be cooking for her great-grandchildren.  PERTINENT  PMH / PSH: HTN, COPD, GERD, T2DM, HLD, spinal stenosis  Patient Care Team: Zola Button, MD as PCP - General (Family Medicine) Debara Pickett, Nadean Corwin, MD as PCP - Cardiology (Cardiology) [name], MD as Consulting Physician (Gastroenterology) Warden Fillers, MD as Consulting Physician (Ophthalmology) Lazaro Arms, RN as [place] Management   OBJECTIVE:   BP (!) 187/87   Pulse (!) 101   Wt 178 lb (80.7 kg)   SpO2 100%   BMI 31.53 kg/m   Physical Exam Constitutional:      General: She is not in acute distress.    Appearance: She is obese.  Cardiovascular:     Rate and Rhythm: Normal rate and regular rhythm.  Pulmonary:     Effort: Pulmonary effort is normal. No respiratory distress.     Breath sounds: Normal breath sounds.  Musculoskeletal:     Cervical back: Neck supple.  Neurological:     Mental Status: She is alert.          02/24/2022    3:59 PM  Depression screen PHQ 2/9  Decreased Interest 1  Down, Depressed, Hopeless 1  PHQ - 2 Score 2  Altered sleeping 2  Tired, decreased energy 3  Change in appetite 2  Feeling bad or failure about yourself  0  Trouble concentrating 1  Moving slowly or fidgety/restless 1  Suicidal thoughts 0  PHQ-9 Score 11  Difficult doing work/chores Somewhat difficult     {Show previous vital signs (optional):23777}    ASSESSMENT/PLAN:   Acute bacterial sinusitis She does have background chronic sinusitis but with upper respiratory symptoms with fever ongoing for 3 weeks, I think it is reasonable to treat with antibiotics.  With history of COPD and increased cough as well as increased sputum production, also reasonable to treat with steroids.  Though lungs are clear and she is not in any respiratory distress fortunately. - amoxicillin-clavulanate x 5d - prednisone 40 mg x 5d  We will reevaluate her blood pressure once she is feeling better.  Refill gabapentin and Lantus  Return in about 2 months (around 04/26/2022) for f/u diabetes.   Zola Button, MD [place]

## 2022-02-26 ENCOUNTER — Telehealth: Payer: Self-pay | Admitting: *Deleted

## 2022-02-26 NOTE — Telephone Encounter (Signed)
  Care Coordination Note  02/26/2022 Name: [name] [name] MRN: [id number] DOB: [birth year]  [name] [name] is a 71 y.o. year old female who is a primary care patient of Zola Button, MD and is actively engaged with the care management team. I reached out to [facility] by phone today to assist with re-scheduling a follow up visit with the RN Case Manager  Follow up plan: Unsuccessful telephone outreach attempt made. A HIPAA compliant phone message was left for the patient providing contact information and requesting a return call.   [place]  Direct Dial: [phone]

## 2022-03-04 ENCOUNTER — Ambulatory Visit: Payer: Medicare HMO

## 2022-03-10 ENCOUNTER — Telehealth: Payer: Self-pay

## 2022-03-10 DIAGNOSIS — E1142 Type 2 diabetes mellitus with diabetic polyneuropathy: Secondary | ICD-10-CM

## 2022-03-10 MED ORDER — LANTUS SOLOSTAR 100 UNIT/ML ~~LOC~~ SOPN: 12.0000 [IU] | PEN_INJECTOR | Freq: Every day | SUBCUTANEOUS | 2 refills | Status: DC

## 2022-03-10 NOTE — Telephone Encounter (Signed)
Rx resent, should be 12 units daily

## 2022-03-10 NOTE — Telephone Encounter (Signed)
Pharmacy calls nurse line requesting clarification on Lantus prescription.   She reports two sets of directions noted.  Please clarify and resend.

## 2022-03-17 NOTE — Progress Notes (Signed)
  Care Coordination Note  03/17/2022 Name: [name] [name] MRN: [id number] DOB: Sep 05, 1950  [name] [name] is a 71 y.o. year old female who is a primary care patient of Zola Button, MD and is actively engaged with the care management team. I reached out to [facility] by phone today to assist with re-scheduling a follow up visit with the RN Case Manager  Follow up plan: Telephone appointment with care management team member scheduled for:03/25/22  [place]  Direct Dial: [phone]

## 2022-03-25 ENCOUNTER — Ambulatory Visit: Payer: Self-pay

## 2022-03-27 ENCOUNTER — Other Ambulatory Visit: Payer: Self-pay | Admitting: Family Medicine

## 2022-03-27 NOTE — Patient Instructions (Signed)
Visit Information  Thank you for taking time to visit with me today. Please don't hesitate to contact me if I can be of assistance to you.   Following are the goals we discussed today:   Goals Addressed             This Visit's Progress    I want to learn how to control my DM COPD and ease my back pain       Care Coordination Interventions: Provided instruction about proper use of medications used for management of COPD including inhalers Discussed the importance of adequate rest and management of fatigue with COPD Reviewed medications with patient and discussed importance of medication adherence Discussed plans with patient for ongoing care management follow up and provided patient with direct contact information for care management team Review of patient status, including review of consultants reports, relevant laboratory and other test results, and medications completed Diet Discussed use of relaxation techniques and/or diversional activities to assist with pain reduction (distraction, imagery, relaxation, massage, acupressure, TENS, heat, and cold application Reviewed with patient prescribed pharmacological and nonpharmacological pain relief strategies Active listening / Reflection utilized  Emotional Support Provided Problem [place] strategies reviewed        Our next appointment is by telephone on 04/29/22 at 1130 am  Please call the care guide team at [phone] if you need to cancel or reschedule your appointment.   If you are experiencing a Mental Health or [place] or need someone to talk to, please call 1-800-273-TALK (toll free, 24 hour hotline)  The patient verbalized understanding of instructions, educational materials, and care plan provided today.    Lazaro Arms RN, BSN, [place] [facility]   Phone: [phone]

## 2022-03-27 NOTE — Patient Outreach (Signed)
  Care Coordination   Follow Up Visit Note   03/25/2022 Name: [name] [name] MRN: [id number] DOB: 07/21/1950  [name] [name] is a 71 y.o. year old female who sees Zola Button, MD for primary care. I spoke with  [name] by phone today.  What matters to the patients health and wellness today?  [name] [name] has been ill with a virus, which has caused her blood pressure to rise. Although she does not have a fever, she experiences a lot of thick white phlegm. Her breathing seems to be normal. Her blood pressure reading was 150/90, and she is taking her medications as prescribed. I suggested that she should drink something hot to reduce the phlegm, and she opted for hot ginger tea.    Goals Addressed             This Visit's Progress    I want to learn how to control my DM COPD and ease my back pain       Care Coordination Interventions: Provided instruction about proper use of medications used for management of COPD including inhalers Discussed the importance of adequate rest and management of fatigue with COPD Reviewed medications with patient and discussed importance of medication adherence Discussed plans with patient for ongoing care management follow up and provided patient with direct contact information for care management team Review of patient status, including review of consultants reports, relevant laboratory and other test results, and medications completed Diet Discussed use of relaxation techniques and/or diversional activities to assist with pain reduction (distraction, imagery, relaxation, massage, acupressure, TENS, heat, and cold application Reviewed with patient prescribed pharmacological and nonpharmacological pain relief strategies Active listening / Reflection utilized  Emotional Support Provided Problem [place] strategies reviewed        SDOH assessments and interventions completed:  No     Care Coordination Interventions:  Yes, provided    Follow up plan: Follow up call scheduled for 04/29/22 1130 am    Encounter Outcome:  Pt. Visit Completed    Lazaro Arms RN, BSN, [place] [facility]   Phone: [phone]

## 2022-04-10 ENCOUNTER — Telehealth: Payer: Self-pay | Admitting: Family Medicine

## 2022-04-10 NOTE — Telephone Encounter (Signed)
Spoke with patient to schedule Medicare Annual Wellness Visit (AWV) either virtually or phone   Pt wanted to schedule after 1st of year.  She is aware we will call her back once schedule is open  Last AWV 08/28/16 please schedule with Nurse Health Adviser   45 min for awv-i and in office appointments 30 min for awv-s  phone/virtual appointments

## 2022-04-29 ENCOUNTER — Ambulatory Visit: Payer: Self-pay

## 2022-04-29 NOTE — Patient Outreach (Signed)
  Care Coordination   Follow Up Visit Note   04/29/2022 Name: [name] [name] MRN: [id number] DOB: 06/09/1950  [name] [name] is a 72 y.o. year old female who sees Zola Button, MD for primary care. I spoke with  [name] [name] by phone today.  What matters to the patients health and wellness today?  I need to get the code for my Elenor Legato and a New Blood pressure monitor    Goals Addressed             This Visit's Progress    I want to learn how to control my DM COPD and ease my back pain       Care Coordination Interventions: Provided instruction about proper use of medications used for management of COPD including inhalers Discussed the importance of adequate rest and management of fatigue with COPD Reviewed medications with patient and discussed importance of medication adherence Discussed plans with patient for ongoing care management follow up and provided patient with direct contact information for care management team Review of patient status, including review of consultants reports, relevant laboratory and other test results, and medications completed Diet Discussed use of relaxation techniques and/or diversional activities to assist with pain reduction (distraction, imagery, relaxation, massage, acupressure, TENS, heat, and cold application Reviewed with patient prescribed pharmacological and nonpharmacological pain relief strategies Active listening / Reflection utilized  Emotional Support Provided Problem [place] strategies reviewed [name] [name] is doing well, but she is still dealing with her allergies, left leg pain, and stiffness. She rates her pain at about 7/10. To alleviate her discomfort, she is doing some stretching exercises. Unfortunately, she was unable to provide me with her blood sugar or blood pressure numbers because she had not received the code for [place]. She informed me that she needed a new blood pressure monitor. She will purchase a monitor and  call the office to request the code for her Elenor Legato. However, she denies having any symptoms of hypertension or type 2 diabetes.         SDOH assessments and interventions completed:  No{THN Tip this will not be part of the note when signed-REQUIRED REPORT FIELD DO NOT DELETE (Optional):27901}     Care Coordination Interventions:  Yes, provided {THN Tip this will not be part of the note when signed-REQUIRED REPORT FIELD DO NOT DELETE (Optional):27901}  Follow up plan: Follow up call scheduled for 05/29/22 1130 am    Encounter Outcome:  Pt. Visit Completed {THN Tip this will not be part of the note when signed-REQUIRED REPORT FIELD DO NOT DELETE (Optional):27901}  Lazaro Arms RN, BSN, [place] [facility]   Phone: [phone]

## 2022-04-29 NOTE — Patient Instructions (Signed)
Visit Information  Thank you for taking time to visit with me today. Please don't hesitate to contact me if I can be of assistance to you.   Following are the goals we discussed today:   Goals Addressed             This Visit's Progress    I want to learn how to control my DM COPD and ease my back pain       Care Coordination Interventions: Provided instruction about proper use of medications used for management of COPD including inhalers Discussed the importance of adequate rest and management of fatigue with COPD Reviewed medications with patient and discussed importance of medication adherence Discussed plans with patient for ongoing care management follow up and provided patient with direct contact information for care management team Review of patient status, including review of consultants reports, relevant laboratory and other test results, and medications completed Diet Discussed use of relaxation techniques and/or diversional activities to assist with pain reduction (distraction, imagery, relaxation, massage, acupressure, TENS, heat, and cold application Reviewed with patient prescribed pharmacological and nonpharmacological pain relief strategies Active listening / Reflection utilized  Emotional Support Provided Problem [place] strategies reviewed [name] [name] is doing well, but she is still dealing with her allergies, left leg pain, and stiffness. She rates her pain at about 7/10. To alleviate her discomfort, she is doing some stretching exercises. Unfortunately, she was unable to provide me with her blood sugar or blood pressure numbers because she had not received the code for [place]. She informed me that she needed a new blood pressure monitor. She will purchase a monitor and call the office to request the code for her Elenor Legato. However, she denies having any symptoms of hypertension or type 2 diabetes.         Our next appointment is by telephone on 05/29/22  at  1130 am  Please call the care guide team at [phone] if you need to cancel or reschedule your appointment.   If you are experiencing a Mental Health or [place] or need someone to talk to, please call 1-800-273-TALK (toll free, 24 hour hotline)  Patient verbalizes understanding of instructions and care plan provided today and agrees to view in [place]. Active MyChart status and patient understanding of how to access instructions and care plan via MyChart confirmed with patient.     Lazaro Arms RN, BSN, [place] [facility]   Phone: [phone]

## 2022-05-14 ENCOUNTER — Other Ambulatory Visit: Payer: Self-pay | Admitting: Family Medicine

## 2022-05-29 ENCOUNTER — Ambulatory Visit: Payer: Medicare HMO | Admitting: Family Medicine

## 2022-05-29 ENCOUNTER — Telehealth: Payer: Self-pay

## 2022-05-29 NOTE — Patient Outreach (Signed)
  Care Coordination   05/29/2022 Name: [name] [name] MRN: [id number] DOB: June 30, 1950   Care Coordination Outreach Attempts:  An unsuccessful telephone outreach was attempted today to offer the patient information about available care coordination services as a benefit of their health plan.   Follow Up Plan:  Additional outreach attempts will be made to offer the patient care coordination information and services.   Encounter Outcome:  No Answer   Care Coordination Interventions:  No, not indicated     Lazaro Arms RN, BSN, [place] [facility]   Phone: [phone]

## 2022-05-30 ENCOUNTER — Ambulatory Visit: Payer: Medicare HMO | Admitting: Family Medicine

## 2022-05-30 ENCOUNTER — Ambulatory Visit (INDEPENDENT_AMBULATORY_CARE_PROVIDER_SITE_OTHER): Payer: Medicare HMO | Admitting: Family Medicine

## 2022-05-30 ENCOUNTER — Encounter: Payer: Self-pay | Admitting: Family Medicine

## 2022-05-30 ENCOUNTER — Telehealth: Payer: Self-pay | Admitting: Family Medicine

## 2022-05-30 VITALS — BP 136/64 | HR 90 | Ht 63.0 in | Wt 197.0 lb

## 2022-05-30 DIAGNOSIS — J441 Chronic obstructive pulmonary disease with (acute) exacerbation: Secondary | ICD-10-CM | POA: Diagnosis not present

## 2022-05-30 DIAGNOSIS — Z23 Encounter for immunization: Secondary | ICD-10-CM | POA: Diagnosis not present

## 2022-05-30 DIAGNOSIS — E1142 Type 2 diabetes mellitus with diabetic polyneuropathy: Secondary | ICD-10-CM

## 2022-05-30 DIAGNOSIS — K089 Disorder of teeth and supporting structures, unspecified: Secondary | ICD-10-CM

## 2022-05-30 DIAGNOSIS — E1159 Type 2 diabetes mellitus with other circulatory complications: Secondary | ICD-10-CM

## 2022-05-30 DIAGNOSIS — I152 Hypertension secondary to endocrine disorders: Secondary | ICD-10-CM

## 2022-05-30 DIAGNOSIS — J189 Pneumonia, unspecified organism: Secondary | ICD-10-CM | POA: Diagnosis not present

## 2022-05-30 DIAGNOSIS — R052 Subacute cough: Secondary | ICD-10-CM

## 2022-05-30 DIAGNOSIS — R6889 Other general symptoms and signs: Secondary | ICD-10-CM | POA: Diagnosis not present

## 2022-05-30 LAB — POC SOFIA 2 FLU + SARS ANTIGEN FIA
Influenza A, POC: NEGATIVE
Influenza B, POC: NEGATIVE
SARS Coronavirus 2 Ag: NEGATIVE

## 2022-05-30 LAB — POCT GLYCOSYLATED HEMOGLOBIN (HGB A1C): HbA1c, POC (controlled diabetic range): 9.4 % — AB (ref 0.0–7.0)

## 2022-05-30 MED ORDER — PREDNISONE 20 MG PO TABS: 20.0000 mg | ORAL_TABLET | Freq: Every day | ORAL | 0 refills | Status: AC

## 2022-05-30 MED ORDER — AMOXICILLIN-POT CLAVULANATE 875-125 MG PO TABS: 1.0000 | ORAL_TABLET | Freq: Two times a day (BID) | ORAL | 0 refills | Status: AC

## 2022-05-30 MED ORDER — ZOSTER VAC RECOMB ADJUVANTED 50 MCG/0.5ML IM SUSR: 0.5000 mL | Freq: Once | INTRAMUSCULAR | 0 refills | Status: AC

## 2022-05-30 NOTE — Telephone Encounter (Signed)
Called regarding lab results from clinic visit. A1c still uncontrolled. Respiratory swab negative for flu and COVID.   Unable to reach patient with either phone number listed on file. Will send letter.   Ezequiel Essex, MD

## 2022-05-30 NOTE — Progress Notes (Unsigned)
    SUBJECTIVE:   CHIEF COMPLAINT / HPI:   Cough, cold symptoms One month Congestion Nose bleeds every other day - green phlegm mixed with blood, burns there too Coughing up green phlegm - dark green Chest burns with coughing Runny nose - thick discharge Has experienced fever all month Fever last night Some nausea and vomiting  Taking albuterol nebulizer twice daily Albuterol pumps twice day Stopped pulmicort because it caused chest burning - 3 weeks ago  Diabetes Measuring glucose at home, ranges 140-160, highest 200 Losing weight Needs code from Dr. Valentina Lucks for [facility] - will message  Losing weight Dental issues Surgery soon to get bad teeth out of mouth  PERTINENT  PMH / PSH:  Patient Active Problem List   Diagnosis Date Noted   Urticaria 10/24/2021   Myofascial pain dysfunction syndrome 07/29/2019   Chronic left-sided thoracic back pain 07/29/2019   Keloid 05/09/2019   NAFLD (nonalcoholic fatty liver disease) 09/10/2018   Chronic sinusitis 07/14/2017   Hypertension associated with diabetes ([place])    Spinal stenosis of lumbosacral region with radiculopathy 07/16/2015   Chronic bronchitis with COPD (chronic obstructive pulmonary disease)    Coronary artery disease involving native coronary artery of native heart with angina pectoris ([place])    Dyslipidemia associated with type 2 diabetes mellitus ([place]) 10/26/2014   Tobacco abuse 10/26/2014   Anxiety state 09/27/2014   Candidal intertrigo 05/11/2013   Fibromyalgia 02/14/2013   Diabetic peripheral neuropathy ([place]) 02/14/2013   DM type 2 with diabetic peripheral neuropathy ([place]) 03/01/2011   Diabetic gastroparesis associated with type 2 diabetes mellitus ([place]) 11/07/2010   Pancreatitis, recurrent 11/07/2010   Chronic cough 09/25/2010   DEPRESSION, MAJOR, WITH PSYCHOTIC BEHAVIOR 08/14/2009   Chronic pain syndrome 08/14/2009   GERD (gastroesophageal reflux disease) 02/11/2008   URINARY INCONTINENCE 10/21/2006     OBJECTIVE:   BP 136/64   Pulse 90   Ht '5\' 3"'$  (1.6 m)   Wt 197 lb (89.4 kg)   SpO2 98%   BMI 34.90 kg/m   ***  ASSESSMENT/PLAN:   No problem-specific Assessment & Plan notes found for this encounter.     Ezequiel Essex, MD [place]

## 2022-05-30 NOTE — Patient Instructions (Addendum)
It was wonderful to see you today. Thank you for allowing me to be a part of your care. Below is a short summary of what we discussed at your visit today:  Cold symptoms Today we swabbed you for flu and COVID. I will call you with the results.  Take the prednisone 20 mg daily with breakfast every day for 5 days. Take the Augmentin twice daily every day for 7 days.  Come back in a week to follow-up on how the medicine is working for you in the talk about your diabetes.  Diabetes I have referred you to ophthalmology (eye doctor) for routine diabetic eye exam.  We recommend that everybody with diabetes get this done once per year to make sure that arteries in the back of your eyes are fine. Someone from their office should be calling you in 1 to 2 weeks to schedule an appointment.  If you do not hear from them, let us know. We may need to nudge along the referral.    Today we checked your A1c. If the results are normal, I will send you a letter or MyChart message. If the results are abnormal, I will give you a call.    *Please schedule an appointment to come back and see your PCP within 1 to 2 weeks to discuss your diabetes*  Medicare Annual Wellness Exam Your chart indicates that you are due for your Medicare annual wellness exam.  Your insurance likes [place] to do one of these every year.  This is a nurse only visit that takes about 30 minutes to an hour.  It can be done either in person or virtually.  This is an in-depth visit that focuses on preventative care and keeping you healthy.  Somebody from our clinic will be calling you soon to get this scheduled.  Shingles Vaccine I have written a prescription for the shingles vaccine.  Please take this to your pharmacy to have this administered. Your insurance prefers you get this specific vaccine at the pharmacy instead of in clinic.   Mammogram I have ordered your routine mammogram to screen for breast cancer. This will be at the [facility]. You will call them directly to make an appointment at your convenience. Information below.     Please bring all of your medications to every appointment!  If you have any questions or concerns, please do not hesitate to contact us via phone or MyChart message.   Ezequiel Essex, MD

## 2022-06-01 DIAGNOSIS — J441 Chronic obstructive pulmonary disease with (acute) exacerbation: Secondary | ICD-10-CM | POA: Insufficient documentation

## 2022-06-01 DIAGNOSIS — K089 Disorder of teeth and supporting structures, unspecified: Secondary | ICD-10-CM | POA: Insufficient documentation

## 2022-06-01 DIAGNOSIS — Z23 Encounter for immunization: Secondary | ICD-10-CM | POA: Insufficient documentation

## 2022-06-01 NOTE — Assessment & Plan Note (Signed)
1 month of symptoms and patient with history of COPD, and no longer on maintenance inhaler.  Given shortness of breath and changes in sputum, will treat for exacerbation.  Reassuringly, SpO2 normal on room air.  Given prolonged symptoms, favor Augmentin over azithromycin.  Will prescribe reduced dose prednisone given uncontrolled diabetes. - Augmentin 875-125 mg twice daily x 7 days - Prednisone 20 mg daily with breakfast x 5 days - Nasal swab for COVID and flu (personal information only, would not affect treatment) - Follow-up in 1 week or less; if no better at that time, would pursue CXR

## 2022-06-01 NOTE — Assessment & Plan Note (Signed)
Patient believes she is lost weight due to poor dentition.  Chart review shows slight decrease in weight overall.  Seem to have drastic weight loss from September to November 2023, however has gained 20 pounds since and is now back closer to baseline.  At this time, I do not have significant clinical concern for weight loss due to poor dentition.  She  has established with a dentist and will pursue surgery for removal of several teeth.

## 2022-06-01 NOTE — Assessment & Plan Note (Signed)
Prescription for shingles vaccine printed and provided to patient to obtain at her pharmacy.

## 2022-06-01 NOTE — Assessment & Plan Note (Signed)
Will obtain A1c today.  Recommend follow-up in 1 week to discuss diabetes further.  Hopeful to obtain urine sample for microalbumin at that time. - A1c today - Refer to ophthalmology

## 2022-06-01 NOTE — Assessment & Plan Note (Signed)
At goal today, no changes in regimen.

## 2022-06-06 ENCOUNTER — Ambulatory Visit: Payer: Self-pay | Admitting: Family Medicine

## 2022-06-06 NOTE — Progress Notes (Deleted)
    SUBJECTIVE:   CHIEF COMPLAINT / HPI:  No chief complaint on file.   Patient was seen by my colleague Dr. Jeani Hawking 1 week ago for cold symptoms treated for COPD exacerbation.  A1c checked at that time was elevated 9.4.  Current diabetes medications include Lantus 12 units daily and NovoLog 10 units daily with lunch.  PERTINENT  PMH / PSH: HTN, COPD, GERD, T2DM, HLD, spinal stenosis  Patient Care Team: Zola Button, MD as PCP - General (Family Medicine) Debara Pickett Nadean Corwin, MD as PCP - Cardiology (Cardiology) [name], MD as Consulting Physician (Gastroenterology) Warden Fillers, MD as Consulting Physician (Ophthalmology) Lazaro Arms, RN as [place] Management   OBJECTIVE:   There were no vitals taken for this visit.  Physical Exam      05/30/2022    4:09 PM  Depression screen PHQ 2/9  Decreased Interest 2  Down, Depressed, Hopeless 1  PHQ - 2 Score 3  Altered sleeping 1  Tired, decreased energy 1  Change in appetite 1  Feeling bad or failure about yourself  0  Trouble concentrating 1  Moving slowly or fidgety/restless 1  Suicidal thoughts 0  PHQ-9 Score 8  Difficult doing work/chores Somewhat difficult     {Show previous vital signs (optional):23777}  {Labs  Heme  Chem  Endocrine  Serology  Results Review (optional):23779}  ASSESSMENT/PLAN:   No problem-specific Assessment & Plan notes found for this encounter.    No follow-ups on file.   Zola Button, MD [place]

## 2022-06-06 NOTE — Patient Instructions (Incomplete)
It was nice seeing you today!  Blood work today.  See me in 3 months or whenever is a good for you.  Stay well, Sherin Murdoch, MD [facility] [phone]  --  Make sure to check out at the front desk before you leave today.  Please arrive at least 15 minutes prior to your scheduled appointments.  If you had blood work today, I will send you a MyChart message or a letter if results are normal. Otherwise, I will give you a call.  If you had a referral placed, they will call you to set up an appointment. Please give us a call if you don't hear back in the next 2 weeks.  If you need additional refills before your next appointment, please call your pharmacy first.  

## 2022-06-09 ENCOUNTER — Telehealth: Payer: Self-pay | Admitting: Pharmacist

## 2022-06-09 NOTE — Telephone Encounter (Signed)
Attempted to call patient [name] [name] issue.   Patient was not seen at scheduled visit in [facility] on 2/16  I tried to contact patient through contact - granddaughter - [place].   Left message asking for patient to call back to my direct phone line: [phone]

## 2022-06-09 NOTE — Telephone Encounter (Signed)
Reviewed and agree with Dr Graylin Shiver plan.

## 2022-06-18 ENCOUNTER — Telehealth: Payer: Self-pay | Admitting: *Deleted

## 2022-06-18 NOTE — Progress Notes (Signed)
  Care Coordination Note  06/18/2022 Name: [name] [name] MRN: [id number] DOB: 1950-11-05  [name] [name] is a 72 y.o. year old female who is a primary care patient of Zola Button, MD and is actively engaged with the care management team. I reached out to [facility] by phone today to assist with re-scheduling a follow up visit with the RN Case Manager  Follow up plan: Unsuccessful telephone outreach attempt made. A HIPAA compliant phone message was left for the patient providing contact information and requesting a return call.   [place]  Direct Dial: [phone]

## 2022-06-19 ENCOUNTER — Ambulatory Visit: Payer: Medicare HMO | Admitting: Gastroenterology
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
## 2022-06-21 ENCOUNTER — Other Ambulatory Visit: Payer: Self-pay | Admitting: Family Medicine

## 2022-06-21 DIAGNOSIS — E1142 Type 2 diabetes mellitus with diabetic polyneuropathy: Secondary | ICD-10-CM

## 2022-06-24 NOTE — Progress Notes (Signed)
  Care Coordination Note  06/24/2022 Name: [name] [name] MRN: [id number] DOB: [birth year]  [name] [name] is a 72 y.o. year old female who is a primary care patient of Zola Button, MD and is actively engaged with the care management team. I reached out to [facility] by phone today to assist with re-scheduling a follow up visit with the RN Case Manager  Follow up plan: Unsuccessful telephone outreach attempt made.We have been unable to make contact with the patient for follow up.   [place]  Direct Dial: [phone]

## 2022-06-25 ENCOUNTER — Ambulatory Visit: Payer: Medicare HMO | Admitting: Family Medicine

## 2022-06-25 NOTE — Progress Notes (Deleted)
    SUBJECTIVE:   CHIEF COMPLAINT / HPI:  No chief complaint on file.   Patient was seen in clinic 1 month ago treated for COPD exacerbation with antibiotics and reduced dose of prednisone.  PERTINENT  PMH / PSH: ***  Patient Care Team: Zola Button, MD as PCP - General (Family Medicine) Debara Pickett Nadean Corwin, MD as PCP - Cardiology (Cardiology) [name], MD as Consulting Physician (Gastroenterology) Warden Fillers, MD as Consulting Physician (Ophthalmology) Lazaro Arms, RN as [place] [facility]   OBJECTIVE:   There were no vitals taken for this visit.  Physical Exam      05/30/2022    4:09 PM  Depression screen PHQ 2/9  Decreased Interest 2  Down, Depressed, Hopeless 1  PHQ - 2 Score 3  Altered sleeping 1  Tired, decreased energy 1  Change in appetite 1  Feeling bad or failure about yourself  0  Trouble concentrating 1  Moving slowly or fidgety/restless 1  Suicidal thoughts 0  PHQ-9 Score 8  Difficult doing work/chores Somewhat difficult     {Show previous vital signs (optional):23777}  {Labs  Heme  Chem  Endocrine  Serology  Results Review (optional):23779}  ASSESSMENT/PLAN:   No problem-specific Assessment & Plan notes found for this encounter.    No follow-ups on file.   Zola Button, MD [place]

## 2022-06-26 ENCOUNTER — Other Ambulatory Visit: Payer: Self-pay | Admitting: Family Medicine

## 2022-07-07 ENCOUNTER — Ambulatory Visit (INDEPENDENT_AMBULATORY_CARE_PROVIDER_SITE_OTHER): Payer: Medicare HMO | Admitting: Family Medicine

## 2022-07-07 ENCOUNTER — Encounter: Payer: Self-pay | Admitting: Family Medicine

## 2022-07-07 VITALS — BP 173/75 | HR 92 | Ht 63.0 in | Wt 196.8 lb

## 2022-07-07 DIAGNOSIS — E1159 Type 2 diabetes mellitus with other circulatory complications: Secondary | ICD-10-CM

## 2022-07-07 DIAGNOSIS — E1142 Type 2 diabetes mellitus with diabetic polyneuropathy: Secondary | ICD-10-CM

## 2022-07-07 DIAGNOSIS — F4321 Adjustment disorder with depressed mood: Secondary | ICD-10-CM

## 2022-07-07 DIAGNOSIS — I152 Hypertension secondary to endocrine disorders: Secondary | ICD-10-CM

## 2022-07-07 DIAGNOSIS — F32A Depression, unspecified: Secondary | ICD-10-CM | POA: Diagnosis not present

## 2022-07-07 MED ORDER — CARVEDILOL 25 MG PO TABS: 25.0000 mg | ORAL_TABLET | Freq: Two times a day (BID) | ORAL | 5 refills | Status: DC

## 2022-07-07 MED ORDER — MIRTAZAPINE 7.5 MG PO TABS: 7.5000 mg | ORAL_TABLET | Freq: Every day | ORAL | 1 refills | Status: DC

## 2022-07-07 MED ORDER — GABAPENTIN 300 MG PO CAPS: 600.0000 mg | ORAL_CAPSULE | Freq: Two times a day (BID) | ORAL | 2 refills | Status: DC | PRN

## 2022-07-07 MED ORDER — ONDANSETRON HCL 4 MG PO TABS: 4.0000 mg | ORAL_TABLET | Freq: Three times a day (TID) | ORAL | 0 refills | Status: DC | PRN

## 2022-07-07 NOTE — Patient Instructions (Addendum)
It was nice seeing you today!  Start the mirtazapine (Remeron) at bedtime to help with your mood and sleep.  You can also try the hydroxyzine at bedtime to help with sleep.  Keep up with the grief classes.  If things are getting worse and you need any urgent behavioral health care, please go to [facility] health as below.  Please go to:  [facility] [address]  [place], [place] [address] [phone]   Urgent psychiatry (medication management) Monday-Thursday 8-11AM.   It is highly recommended that you show up at 7/730 because it is first come first serve.    For urgent therapy (not medication) Walk in hours are 8-1pm Monday through Wednesday (please come at 7/730 to ensure you are seen)   Stay well, Zola Button, MD [place] [phone]  --  Make sure to check out at the front desk before you leave today.  Please arrive at least 15 minutes prior to your scheduled appointments.  If you had blood work today, I will send you a MyChart message or a letter if results are normal. Otherwise, I will give you a call.  If you had a referral placed, they will call you to set up an appointment. Please give [place] a call if you don't hear back in the next 2 weeks.  If you need additional refills before your next appointment, please call your pharmacy first.

## 2022-07-07 NOTE — Assessment & Plan Note (Signed)
Significantly elevated today.  Partially due to medication nonadherence as she ran out of her carvedilol and likely stress contributing as well.  Carvedilol refilled and we will make no other medication changes today.

## 2022-07-07 NOTE — Assessment & Plan Note (Signed)
Patient of underlying anxiety and depression experiencing significant grief after all remaining son passed away recently.  Struggling with anxiety and insomnia. - start mirtazapine 7.5 mg qhs, consider increasing at follow-up if needed - advised that she can take her hydroxyzine for sleep and anxiety if needed - [facility] resources provided - continue grief counseling

## 2022-07-07 NOTE — Progress Notes (Signed)
    SUBJECTIVE:   CHIEF COMPLAINT / HPI:  Chief Complaint  Patient presents with   Abdominal Pain   Generalized Body Aches    Oldest son passed away recently in the past month.  This was unexpected, he passed away of a heart attack.  Her other 2 children have already passed away.  This has been very difficult for her as her only remaining child has passed away.  She has had significant difficulty with sleeping, feeling anxious, more irritable. She does have family and her pastor to help support her.  She has been going to a grief class once a month. Denies SI.  She ran out of her carvedilol and gabapentin.  Also needs refill on ondansetron.  PERTINENT  PMH / PSH: HTN, COPD, GERD, T2DM, HLD, spinal stenosis, anxiety, depression  Patient Care Team: Zola Button, MD as PCP - General (Family Medicine) Debara Pickett, Nadean Corwin, MD as PCP - Cardiology (Cardiology) [name], MD as Consulting Physician (Gastroenterology) Warden Fillers, MD as Consulting Physician (Ophthalmology) Lazaro Arms, RN as [place] [facility]   OBJECTIVE:   BP (!) 173/75   Pulse 92   Ht 5\' 3"  (1.6 m)   Wt 196 lb 12.8 oz (89.3 kg)   SpO2 98%   BMI 34.86 kg/m   Physical Exam Constitutional:      General: She is not in acute distress. HENT:     Head: Normocephalic and atraumatic.  Cardiovascular:     Rate and Rhythm: Normal rate and regular rhythm.  Pulmonary:     Effort: Pulmonary effort is normal. No respiratory distress.     Breath sounds: Normal breath sounds.  Musculoskeletal:     Cervical back: Neck supple.  Neurological:     Mental Status: She is alert.  Psychiatric:     Comments: Tearful throughout entire encounter         07/07/2022   10:59 AM  Depression screen PHQ 2/9  Decreased Interest 3  Down, Depressed, Hopeless 3  PHQ - 2 Score 6  Altered sleeping 3  Tired, decreased energy 3  Change in appetite 3  Feeling bad or failure about yourself  2  Trouble  concentrating 3  Moving slowly or fidgety/restless 1  Suicidal thoughts 0  PHQ-9 Score 21  Difficult doing work/chores Very difficult     {Show previous vital signs (optional):23777}    ASSESSMENT/PLAN:   Depression Patient of underlying anxiety and depression experiencing significant grief after all remaining son passed away recently.  Struggling with anxiety and insomnia. - start mirtazapine 7.5 mg qhs, consider increasing at follow-up if needed - advised that she can take her hydroxyzine for sleep and anxiety if needed - [facility] resources provided - continue grief counseling  Hypertension associated with diabetes ([place]) Significantly elevated today.  Partially due to medication nonadherence as she ran out of her carvedilol and likely stress contributing as well.  Carvedilol refilled and we will make no other medication changes today.   Refills of ondansetron and gabapentin provided today  Will address other chronic medical conditions more in-depth at future visit  Return in about 4 weeks (around 08/04/2022) for f/u grief.   Zola Button, MD [place]

## 2022-07-16 DIAGNOSIS — R6889 Other general symptoms and signs: Secondary | ICD-10-CM | POA: Diagnosis not present

## 2022-07-31 ENCOUNTER — Telehealth: Payer: Self-pay

## 2022-07-31 NOTE — Telephone Encounter (Signed)
LVM

## 2022-08-08 ENCOUNTER — Encounter: Payer: Self-pay | Admitting: Gastroenterology

## 2022-08-15 ENCOUNTER — Encounter: Payer: Self-pay | Admitting: Family Medicine

## 2022-08-15 ENCOUNTER — Ambulatory Visit (INDEPENDENT_AMBULATORY_CARE_PROVIDER_SITE_OTHER): Payer: Medicare HMO

## 2022-08-15 ENCOUNTER — Ambulatory Visit (INDEPENDENT_AMBULATORY_CARE_PROVIDER_SITE_OTHER): Payer: Medicare HMO | Admitting: Family Medicine

## 2022-08-15 VITALS — BP 209/88 | HR 86 | Wt 200.6 lb

## 2022-08-15 VITALS — Ht 63.0 in | Wt 200.0 lb

## 2022-08-15 DIAGNOSIS — B372 Candidiasis of skin and nail: Secondary | ICD-10-CM | POA: Diagnosis not present

## 2022-08-15 DIAGNOSIS — I152 Hypertension secondary to endocrine disorders: Secondary | ICD-10-CM

## 2022-08-15 DIAGNOSIS — F32A Depression, unspecified: Secondary | ICD-10-CM

## 2022-08-15 DIAGNOSIS — J4489 Other specified chronic obstructive pulmonary disease: Secondary | ICD-10-CM

## 2022-08-15 DIAGNOSIS — E1159 Type 2 diabetes mellitus with other circulatory complications: Secondary | ICD-10-CM | POA: Diagnosis not present

## 2022-08-15 DIAGNOSIS — Z1231 Encounter for screening mammogram for malignant neoplasm of breast: Secondary | ICD-10-CM

## 2022-08-15 DIAGNOSIS — Z1211 Encounter for screening for malignant neoplasm of colon: Secondary | ICD-10-CM

## 2022-08-15 DIAGNOSIS — Z0000 Encounter for general adult medical examination without abnormal findings: Secondary | ICD-10-CM

## 2022-08-15 MED ORDER — GABAPENTIN 300 MG PO CAPS: 600.0000 mg | ORAL_CAPSULE | Freq: Two times a day (BID) | ORAL | 2 refills | Status: DC | PRN

## 2022-08-15 MED ORDER — ALBUTEROL SULFATE (2.5 MG/3ML) 0.083% IN NEBU: 2.5000 mg | INHALATION_SOLUTION | Freq: Four times a day (QID) | RESPIRATORY_TRACT | 1 refills | Status: DC | PRN

## 2022-08-15 MED ORDER — NYSTATIN 100000 UNIT/GM EX POWD: Freq: Three times a day (TID) | CUTANEOUS | 3 refills | Status: DC

## 2022-08-15 MED ORDER — HYDROXYZINE HCL 25 MG PO TABS: 25.0000 mg | ORAL_TABLET | Freq: Three times a day (TID) | ORAL | 2 refills | Status: AC | PRN

## 2022-08-15 NOTE — Patient Instructions (Addendum)
It was nice seeing you today!  Try the hydroxyzine 3 times a day as needed to help with sleep or anxiety or nerves.  24 Hour Availability for Walk-IN services  [facility]  [address] [place], [place] [phone] Crisis [phone]   Stay well, Littie Deeds, MD [name] [phone]  --  Make sure to check out at the front desk before you leave today.  Please arrive at least 15 minutes prior to your scheduled appointments.  If you had blood work today, I will send you a MyChart message or a letter if results are normal. Otherwise, I will give you a call.  If you had a referral placed, they will call you to set up an appointment. Please give [place] a call if you don't hear back in the next 2 weeks.  If you need additional refills before your next appointment, please call your pharmacy first.

## 2022-08-15 NOTE — Assessment & Plan Note (Signed)
Significantly elevated today on repeat, did not have time to address in depth.  Stress and pain is probably contributing.  Will address more in depth at follow-up.

## 2022-08-15 NOTE — Patient Instructions (Signed)
[name] , Thank you for taking time to come for your Medicare Wellness Visit. I appreciate your ongoing commitment to your health goals. Please review the following plan we discussed and let me know if I can assist you in the future.   These are the goals we discussed:  Goals      Blood Pressure < 150/90     HEMOGLOBIN A1C < 8     I want to learn how to control my DM COPD and ease my back pain     Care Coordination Interventions: Provided instruction about proper use of medications used for management of COPD including inhalers Discussed the importance of adequate rest and management of fatigue with COPD Reviewed medications with patient and discussed importance of medication adherence Discussed plans with patient for ongoing care management follow up and provided patient with direct contact information for care management team Review of patient status, including review of consultants reports, relevant laboratory and other test results, and medications completed Diet Discussed use of relaxation techniques and/or diversional activities to assist with pain reduction (distraction, imagery, relaxation, massage, acupressure, TENS, heat, and cold application Reviewed with patient prescribed pharmacological and nonpharmacological pain relief strategies Active listening / Reflection utilized  Emotional Support Provided Problem Solving /Task Center strategies reviewed Mrs. [name] [name] is doing well, but she is still dealing with her allergies, left leg pain, and stiffness. She rates her pain at about 7/10. To alleviate her discomfort, she is doing some stretching exercises. Unfortunately, she was unable to provide me with her blood sugar or blood pressure numbers because she had not received the code for [place]. She informed me that she needed a new blood pressure monitor. She will purchase a monitor and call the office to request the code for her [name] [name]. However, she denies having any symptoms of  hypertension or type 2 diabetes.      Quit smoking / using tobacco        This is a list of the screening recommended for you and due dates:  Health Maintenance  Topic Date Due   Zoster (Shingles) Vaccine (1 of 2) Never done   Yearly kidney health urinalysis for diabetes  08/27/2012   Eye exam for diabetics  02/13/2016   Complete foot exam   09/21/2019   Mammogram  07/13/2021   COVID-19 Vaccine (5 - 2023-24 season) 12/20/2021   Colon Cancer Screening  08/15/2022   Yearly kidney function blood test for diabetes  09/13/2022   Hemoglobin A1C  08/28/2022   Flu Shot  11/20/2022   Medicare Annual Wellness Visit  08/15/2023   DTaP/Tdap/Td vaccine (3 - Td or Tdap) 05/08/2029   Pneumonia Vaccine  Completed   DEXA scan (bone density measurement)  Completed   Hepatitis C Screening: USPSTF Recommendation to screen - Ages 38-79 yo.  Completed   HPV Vaccine  Aged Out    Advanced directives: Discussed with patient  Conditions/risks identified: Keep up the good work  Next appointment: Follow up in one year for your annual wellness visit    Preventive Care 65 Years and Older, Female Preventive care refers to lifestyle choices and visits with your health care provider that can promote health and wellness. What does preventive care include? A yearly physical exam. This is also called an annual well check. Dental exams once or twice a year. Routine eye exams. Ask your health care provider how often you should have your eyes checked. Personal lifestyle choices, including: Daily care of your teeth and gums.  Regular physical activity. Eating a healthy diet. Avoiding tobacco and drug use. Limiting alcohol use. Practicing safe sex. Taking low-dose aspirin every day. Taking vitamin and mineral supplements as recommended by your health care provider. What happens during an annual well check? The services and screenings done by your health care provider during your annual well check will  depend on your age, overall health, lifestyle risk factors, and family history of disease. Counseling  Your health care provider may ask you questions about your: Alcohol use. Tobacco use. Drug use. Emotional well-being. Home and relationship well-being. Sexual activity. Eating habits. History of falls. Memory and ability to understand (cognition). Work and work Astronomer. Reproductive health. Screening  You may have the following tests or measurements: Height, weight, and BMI. Blood pressure. Lipid and cholesterol levels. These may be checked every 5 years, or more frequently if you are over 54 years old. Skin check. Lung cancer screening. You may have this screening every year starting at age 60 if you have a 30-pack-year history of smoking and currently smoke or have quit within the past 15 years. Fecal occult blood test (FOBT) of the stool. You may have this test every year starting at age 71. Flexible sigmoidoscopy or colonoscopy. You may have a sigmoidoscopy every 5 years or a colonoscopy every 10 years starting at age 51. Hepatitis C blood test. Hepatitis B blood test. Sexually transmitted disease (STD) testing. Diabetes screening. This is done by checking your blood sugar (glucose) after you have not eaten for a while (fasting). You may have this done every 1-3 years. Bone density scan. This is done to screen for osteoporosis. You may have this done starting at age 49. Mammogram. This may be done every 1-2 years. Talk to your health care provider about how often you should have regular mammograms. Talk with your health care provider about your test results, treatment options, and if necessary, the need for more tests. Vaccines  Your health care provider may recommend certain vaccines, such as: Influenza vaccine. This is recommended every year. Tetanus, diphtheria, and acellular pertussis (Tdap, Td) vaccine. You may need a Td booster every 10 years. Zoster vaccine. You may  need this after age 25. Pneumococcal 13-valent conjugate (PCV13) vaccine. One dose is recommended after age 72. Pneumococcal polysaccharide (PPSV23) vaccine. One dose is recommended after age 59. Talk to your health care provider about which screenings and vaccines you need and how often you need them. This information is not intended to replace advice given to you by your health care provider. Make sure you discuss any questions you have with your health care provider. Document Released: 05/04/2015 Document Revised: 12/26/2015 Document Reviewed: 02/06/2015 Elsevier Interactive Patient Education  2017 [employer].  Fall Prevention in the Home Falls can cause injuries. They can happen to people of all ages. There are many things you can do to make your home safe and to help prevent falls. What can I do on the outside of my home? Regularly fix the edges of walkways and driveways and fix any cracks. Remove anything that might make you trip as you walk through a door, such as a raised step or threshold. Trim any bushes or trees on the path to your home. Use bright outdoor lighting. Clear any walking paths of anything that might make someone trip, such as rocks or tools. Regularly check to see if handrails are loose or broken. Make sure that both sides of any steps have handrails. Any raised decks and porches should have  guardrails on the edges. Have any leaves, snow, or ice cleared regularly. Use sand or salt on walking paths during winter. Clean up any spills in your garage right away. This includes oil or grease spills. What can I do in the bathroom? Use night lights. Install grab bars by the toilet and in the tub and shower. Do not use towel bars as grab bars. Use non-skid mats or decals in the tub or shower. If you need to sit down in the shower, use a plastic, non-slip stool. Keep the floor dry. Clean up any water that spills on the floor as soon as it happens. Remove soap buildup in  the tub or shower regularly. Attach bath mats securely with double-sided non-slip rug tape. Do not have throw rugs and other things on the floor that can make you trip. What can I do in the bedroom? Use night lights. Make sure that you have a light by your bed that is easy to reach. Do not use any sheets or blankets that are too big for your bed. They should not hang down onto the floor. Have a firm chair that has side arms. You can use this for support while you get dressed. Do not have throw rugs and other things on the floor that can make you trip. What can I do in the kitchen? Clean up any spills right away. Avoid walking on wet floors. Keep items that you use a lot in easy-to-reach places. If you need to reach something above you, use a strong step stool that has a grab bar. Keep electrical cords out of the way. Do not use floor polish or wax that makes floors slippery. If you must use wax, use non-skid floor wax. Do not have throw rugs and other things on the floor that can make you trip. What can I do with my stairs? Do not leave any items on the stairs. Make sure that there are handrails on both sides of the stairs and use them. Fix handrails that are broken or loose. Make sure that handrails are as long as the stairways. Check any carpeting to make sure that it is firmly attached to the stairs. Fix any carpet that is loose or worn. Avoid having throw rugs at the top or bottom of the stairs. If you do have throw rugs, attach them to the floor with carpet tape. Make sure that you have a light switch at the top of the stairs and the bottom of the stairs. If you do not have them, ask someone to add them for you. What else can I do to help prevent falls? Wear shoes that: Do not have high heels. Have rubber bottoms. Are comfortable and fit you well. Are closed at the toe. Do not wear sandals. If you use a stepladder: Make sure that it is fully opened. Do not climb a closed  stepladder. Make sure that both sides of the stepladder are locked into place. Ask someone to hold it for you, if possible. Clearly mark and make sure that you can see: Any grab bars or handrails. First and last steps. Where the edge of each step is. Use tools that help you move around (mobility aids) if they are needed. These include: Canes. Walkers. Scooters. Crutches. Turn on the lights when you go into a dark area. Replace any light bulbs as soon as they burn out. Set up your furniture so you have a clear path. Avoid moving your furniture around. If any of your  floors are uneven, fix them. If there are any pets around you, be aware of where they are. Review your medicines with your doctor. Some medicines can make you feel dizzy. This can increase your chance of falling. Ask your doctor what other things that you can do to help prevent falls. This information is not intended to replace advice given to you by your health care provider. Make sure you discuss any questions you have with your health care provider. Document Released: 02/01/2009 Document Revised: 09/13/2015 Document Reviewed: 05/12/2014 Elsevier Interactive Patient Education  2017 [employer].
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
## 2022-08-15 NOTE — Assessment & Plan Note (Signed)
Patient dealing with significant grief related to recent family losses. - will trial hydroxyzine 25 mg 3 times daily as needed for anxiety, would prefer to avoid benzodiazepines - will reach out to social work team to help her get connected with counseling, I think she would benefit from more frequent therapy - continue mirtazapine - could consider SSRI or SNRI (especially given chronic pain issues), but patient was not interested today

## 2022-08-15 NOTE — Progress Notes (Signed)
SUBJECTIVE:   CHIEF COMPLAINT / HPI:  Chief Complaint  Patient presents with   Abdominal Pain   Generalized Body Aches    Patient is here to discuss multiple concerns including increased stress, headaches, abdominal pain, back pain radiating to legs, nasal congestion.  Primarily, patient is most concerned about her mood.  She has been dealing with the death of her only remaining son, but since my last visit with her, her grandson who was in her 30s also passed away of gastric cancer and ultimately went to inpatient hospice.  She has had difficulty sleeping and reports difficulty controlling her nerves.  She is going to grief counseling once a month and she is heavily involved with her church.  Her pastor has been keeping up with her multiple times a week. She reports the mirtazapine was helping initially but not helping as much anymore. She was asking for a fast acting medications such as Valium.  She is requesting for refills for her nystatin powder, gabapentin, albuterol nebulizer solution.  She also wants to make sure she is up-to-date on her cancer screenings.  She is wanting to have a Pap smear done.  PERTINENT  PMH / PSH: HTN, COPD, GERD, T2DM, HLD, spinal stenosis, anxiety, depression  Patient Care Team: Littie Deeds, MD as PCP - General (Family Medicine) Rennis Golden Lisette Abu, MD as PCP - Cardiology (Cardiology) Kathi Der, MD as Consulting Physician (Gastroenterology) Sallye Lat, MD as Consulting Physician (Ophthalmology) Juanell Fairly, RN as [facility] Management Elijah Birk, Weyman Croon, LCSW as Social Worker   OBJECTIVE:   BP (!) 209/88   Pulse 86   Wt 200 lb 9.6 oz (91 kg)   SpO2 100%   BMI 35.53 kg/m   Physical Exam Constitutional:      General: She is not in acute distress. Cardiovascular:     Rate and Rhythm: Normal rate and regular rhythm.  Pulmonary:     Effort: Pulmonary effort is normal. No respiratory distress.     Breath  sounds: Normal breath sounds.  Abdominal:     General: Bowel sounds are normal.     Palpations: Abdomen is soft.     Tenderness: There is abdominal tenderness.     Comments: Diffuse tenderness to palpation worse on the left side.  Musculoskeletal:     Cervical back: Neck supple.  Neurological:     Mental Status: She is alert.  Psychiatric:     Comments: Intermittently tearful and upset during encounter         08/15/2022    3:48 PM  Depression screen PHQ 2/9  Decreased Interest 3  Down, Depressed, Hopeless 3  PHQ - 2 Score 6  Altered sleeping 3  Tired, decreased energy 3  Change in appetite 2  Feeling bad or failure about yourself  0  Trouble concentrating 2  Moving slowly or fidgety/restless 1  Suicidal thoughts 0  PHQ-9 Score 17  Difficult doing work/chores Somewhat difficult     {Show previous vital signs (optional):23777}    ASSESSMENT/PLAN:   Problem List Items Addressed This Visit       Respiratory   Chronic bronchitis with COPD (chronic obstructive pulmonary disease)   Relevant Medications   albuterol (PROVENTIL) (2.5 MG/3ML) 0.083% nebulizer solution     Musculoskeletal and Integument   Candidal intertrigo   Relevant Medications   nystatin (MYCOSTATIN/NYSTOP) powder     Other   Depression    Patient dealing with significant grief related to recent family  losses. - will trial hydroxyzine 25 mg 3 times daily as needed for anxiety, would prefer to avoid benzodiazepines - will reach out to social work team to help her get connected with counseling, I think she would benefit from more frequent therapy - continue mirtazapine - could consider SSRI or SNRI (especially given chronic pain issues), but patient was not interested today      Relevant Medications   hydrOXYzine (ATARAX) 25 MG tablet   Other Visit Diagnoses     Encounter for screening mammogram for malignant neoplasm of breast    -  Primary   Relevant Orders   MM DIGITAL SCREENING BILATERAL         Will plan to perform Pap smear at follow-up given that she did not have adequate screening prior to age 14  Return in about 4 weeks (around 09/12/2022) for f/u grief, pap smear.   Littie Deeds, MD [facility]

## 2022-08-19 ENCOUNTER — Ambulatory Visit: Payer: Self-pay | Admitting: *Deleted

## 2022-08-19 NOTE — Patient Outreach (Signed)
  Care Coordination   08/19/2022 Name: [name] [name] MRN: [id number] DOB: Sep 09, 1950   Care Coordination Outreach Attempts:  An unsuccessful telephone outreach was attempted today to offer the patient information about available care coordination services. Spoke with pt's family who requested a callback in afternoon/3pm- CSW called back at 3pm and was unable to reach pt/left voicemail Follow Up Plan:  Additional outreach attempts will be made to offer the patient care coordination information and services.   Encounter Outcome:  Pt. Request to Call Back   Care Coordination Interventions:  No, not indicated    Reece Levy, MSW, LCSW Clinical Social Worker [facility] [phone]

## 2022-08-22 NOTE — Progress Notes (Signed)
I reviewed the Medications, Problem list, Past Medical, Surgical Histories, Family Histories, and Social Histories.  I reviewed the nurse note.  

## 2022-08-28 ENCOUNTER — Ambulatory Visit: Payer: Medicare HMO | Admitting: Physician Assistant

## 2022-08-28 ENCOUNTER — Encounter: Payer: Self-pay | Admitting: Physician Assistant

## 2022-08-28 DIAGNOSIS — R1310 Dysphagia, unspecified: Secondary | ICD-10-CM

## 2022-08-28 DIAGNOSIS — R6889 Other general symptoms and signs: Secondary | ICD-10-CM | POA: Diagnosis not present

## 2022-08-28 DIAGNOSIS — K209 Esophagitis, unspecified without bleeding: Secondary | ICD-10-CM

## 2022-08-28 DIAGNOSIS — R1013 Epigastric pain: Secondary | ICD-10-CM | POA: Diagnosis not present

## 2022-08-28 DIAGNOSIS — K219 Gastro-esophageal reflux disease without esophagitis: Secondary | ICD-10-CM | POA: Diagnosis not present

## 2022-08-28 DIAGNOSIS — K3184 Gastroparesis: Secondary | ICD-10-CM

## 2022-08-28 MED ORDER — PANTOPRAZOLE SODIUM 40 MG PO TBEC
40.0000 mg | DELAYED_RELEASE_TABLET | Freq: Two times a day (BID) | ORAL | 3 refills | Status: DC
Start: 2022-08-28 — End: 2023-06-09

## 2022-08-28 MED ORDER — METOCLOPRAMIDE HCL 5 MG PO TABS: 5.0000 mg | ORAL_TABLET | Freq: Three times a day (TID) | ORAL | 3 refills | Status: DC

## 2022-08-28 MED ORDER — ONDANSETRON HCL 4 MG PO TABS: 4.0000 mg | ORAL_TABLET | Freq: Three times a day (TID) | ORAL | 3 refills | Status: DC | PRN

## 2022-08-28 MED ORDER — NA SULFATE-K SULFATE-MG SULF 17.5-3.13-1.6 GM/177ML PO SOLN: 1.0000 | ORAL | 0 refills | Status: DC

## 2022-08-28 MED ORDER — ONDANSETRON HCL 4 MG PO TABS: ORAL_TABLET | ORAL | 3 refills | Status: DC

## 2022-08-28 MED ORDER — LINACLOTIDE 145 MCG PO CAPS: 145.0000 ug | ORAL_CAPSULE | Freq: Every day | ORAL | 0 refills | Status: DC

## 2022-08-28 NOTE — Progress Notes (Signed)
Subjective:    Patient ID: [name] [name], female    DOB: 08-02-1950, 72 y.o.   MRN: [id number]  HPI [name] is a pleasant 72 year old African-American female, established with Dr. Adela Lank.  She was last seen about a year ago in the office by myself.  She does have history of gastroparesis, chronic GERD, esophagitis, adenomatous colon polyps and history of recurrent pancreatitis.  She is status post cholecystectomy, has history of coronary artery disease, congestive heart failure, hypertension, COPD, chronic bronchitis, peripheral neuropathy and depression. She comes in today to discuss medication refills, and has complaints of worsening abdominal discomfort over the past 2 to 3 months.  She is not having any localized abdominal pain but says she has been getting pains on both sides of her abdomen intermittently and in the upper and lower abdomen.  She is also had some increased reflux symptoms with sour brash, and complains of dysphagia to solids, does fine with liquids.  She feels that the dysphagia symptoms have been increasing in frequency and would like to have an endoscopy. She is having a bowel movement anywhere from 1-2 times per week to 3-4 times per week.  She does not feel that MiraLAX works well for her she does use Dulcolax periodically with good result.  She has a prescription for dicyclomine which she says does not help her abdominal discomfort and she has not been using this.  Last EGD was done in January 2023 with grade a esophagitis, no stricture noted but was empirically dilated Savary to 18 mm for complaints of dysphagia.  Biopsies no evidence of H. pylori. Last colonoscopy April 2021, 3 polyps removed largest 10 mm and was noted to have a tortuous colon.  Path showed all 3 polyps to be tubular adenomas and indicated for 3-year interval follow-up.  Patient did mention today that her son died recently, and then she had a granddaughter die unexpectedly after that.  She has  been very sad and is undergoing grief counseling .  Review of Systems Pertinent positive and negative review of systems were noted in the above HPI section.  All other review of systems was otherwise negative.   Outpatient Encounter Medications as of 08/28/2022  Medication Sig   acetaminophen (TYLENOL) 325 MG tablet Take 2 tablets (650 mg total) by mouth every 4 (four) hours as needed for mild pain (or Fever >/= 101).   albuterol (PROVENTIL) (2.5 MG/3ML) 0.083% nebulizer solution Take 3 mLs (2.5 mg total) by nebulization every 6 (six) hours as needed for wheezing or shortness of breath.   albuterol (VENTOLIN HFA) 108 (90 Base) MCG/ACT inhaler INHALE 2 PUFFS INTO THE LUNGS EVERY 6 HOURS AS NEEDED FOR WHEEZING OR SHORTNESS OF BREATH   amLODipine (NORVASC) 10 MG tablet TAKE 1 TABLET(10 MG) BY MOUTH DAILY   aspirin EC 81 MG tablet Take 81 mg by mouth daily.   BD PEN NEEDLE NANO 2ND GEN 32G X 4 MM MISC USE AS NEEDED   Blood Glucose Monitoring Suppl (ACCU-CHEK GUIDE) w/Device KIT 1 Device by Does not apply route 4 (four) times daily.   budesonide (PULMICORT) 0.25 MG/2ML nebulizer solution Take 2 mLs (0.25 mg total) by nebulization 2 (two) times daily.   budesonide-formoterol (SYMBICORT) 80-4.5 MCG/ACT inhaler Inhale 2 puffs into the lungs 2 (two) times daily.   carvedilol (COREG) 25 MG tablet Take 1 tablet (25 mg total) by mouth 2 (two) times daily with a meal.   cetirizine (ZYRTEC) 10 MG tablet Take 1 tablet (10  mg total) by mouth daily.   clobetasol ointment (TEMOVATE) 0.05 % Apply 1 application  topically 2 (two) times daily. Apply under nails   Continuous Blood Gluc Sensor (FREESTYLE LIBRE 2 SENSOR) MISC Apply 1 sensor every 14 days.   dextromethorphan-guaiFENesin (MUCINEX DM) 30-600 MG 12hr tablet Take 1 tablet by mouth 2 (two) times daily.   diclofenac Sodium (VOLTAREN) 1 % GEL Apply 4 g topically 4 (four) times daily as needed.   dicyclomine (BENTYL) 10 MG capsule Take 1 capsule (10 mg total) by  mouth every 8 (eight) hours as needed for spasms.   docusate sodium (COLACE) 100 MG capsule Take 100 mg by mouth 2 (two) times daily as needed for moderate constipation.   flunisolide (NASALIDE) 25 MCG/ACT (0.025%) SOLN Place 2 sprays into the nose 2 (two) times daily.   gabapentin (NEURONTIN) 300 MG capsule Take 2 capsules (600 mg total) by mouth 2 (two) times daily as needed.   glucose blood (ACCU-CHEK GUIDE) test strip Use as instructed to test sugars 3 x daily Dx code: E11.42   hydrOXYzine (ATARAX) 25 MG tablet Take 1 tablet (25 mg total) by mouth 3 (three) times daily as needed.   insulin aspart (NOVOLOG FLEXPEN) 100 UNIT/ML FlexPen Inject 10 Units into the skin daily with lunch.   insulin glargine (LANTUS SOLOSTAR) 100 UNIT/ML Solostar Pen Inject 12 Units into the skin daily.   ipratropium (ATROVENT) 0.03 % nasal spray Place 2 sprays into both nostrils 3 (three) times daily.   Lancet Devices (ONE TOUCH DELICA LANCING DEV) MISC 1 Device by Does not apply route 4 (four) times daily.   linaclotide (LINZESS) 145 MCG CAPS capsule Take 1 capsule (145 mcg total) by mouth daily before breakfast.   losartan (COZAAR) 25 MG tablet Take 1 tablet (25 mg total) by mouth at bedtime.   mirtazapine (REMERON) 7.5 MG tablet Take 1 tablet (7.5 mg total) by mouth at bedtime.   Na Sulfate-K Sulfate-Mg Sulf (SUPREP BOWEL PREP KIT) 17.5-3.13-1.6 GM/177ML SOLN Take 1 kit by mouth as directed.   nystatin (MYCOSTATIN) 100000 UNIT/ML suspension Take 2 mLs (200,000 Units total) by mouth 4 (four) times daily. Apply 1mL to each cheek   nystatin (MYCOSTATIN/NYSTOP) powder Apply topically 3 (three) times daily. To affected area   oxybutynin (DITROPAN-XL) 10 MG 24 hr tablet Take 10 mg by mouth daily.   polyethylene glycol (MIRALAX) 17 g packet Take 17 g by mouth 2 (two) times daily. Increase as needed   rosuvastatin (CRESTOR) 40 MG tablet TAKE 1 TABLET(40 MG) BY MOUTH DAILY   terbinafine (LAMISIL) 1 % cream Apply 1  application  topically 2 (two) times daily. Apply under belly   triamcinolone ointment (KENALOG) 0.1 % Apply 1 Application topically 2 (two) times daily.   TRUEplus Lancets 33G MISC TEST BLOOD SUGAR 3 TO 4 TIMES DAILY   [DISCONTINUED] metoCLOPramide (REGLAN) 5 MG tablet TAKE 1 TABLET BY MOUTH 3 TIMES DAILY BEFORE MEALS   [DISCONTINUED] ondansetron (ZOFRAN) 4 MG tablet Take 1 tablet (4 mg total) by mouth every 8 (eight) hours as needed for nausea or vomiting. Take 1 tablet every morning and repeat in 6-8 hours   [DISCONTINUED] pantoprazole (PROTONIX) 40 MG tablet Take 1 tablet (40 mg total) by mouth 2 (two) times daily.   metoCLOPramide (REGLAN) 5 MG tablet Take 1 tablet (5 mg total) by mouth 3 (three) times daily before meals.   ondansetron (ZOFRAN) 4 MG tablet Take 1 tablet every morning and repeat in 6-8 hours  pantoprazole (PROTONIX) 40 MG tablet Take 1 tablet (40 mg total) by mouth 2 (two) times daily.   [DISCONTINUED] ondansetron (ZOFRAN) 4 MG tablet Take 1 tablet (4 mg total) by mouth every 8 (eight) hours as needed for nausea or vomiting. Take 1 tablet every morning and repeat in 6-8 hours   No facility-administered encounter medications on file as of 08/28/2022.   Allergies  Allergen Reactions   Desvenlafaxine Other (See Comments)    REACTION: dysphoria/dellusional   Duloxetine Nausea And Vomiting and Other (See Comments)    REACTION: "wheezing" and tremors, dysphoria   Latex Itching    Denies airway, lung involvement   Levofloxacin Other (See Comments)    Shortness of breath with headache   Lithium Rash   Patient Active Problem List   Diagnosis Date Noted   COPD exacerbation (HCC) 06/01/2022   Poor dentition 06/01/2022   Urticaria 10/24/2021   Myofascial pain dysfunction syndrome 07/29/2019   Keloid 05/09/2019   NAFLD (nonalcoholic fatty liver disease) 16/01/9603   Chronic sinusitis 07/14/2017   Hypertension associated with diabetes (HCC)    Spinal stenosis of lumbosacral  region with radiculopathy 07/16/2015   Chronic bronchitis with COPD (chronic obstructive pulmonary disease)    Coronary artery disease involving native coronary artery of native heart with angina pectoris (HCC)    Dyslipidemia associated with type 2 diabetes mellitus (HCC) 10/26/2014   Tobacco abuse 10/26/2014   Anxiety state 09/27/2014   Candidal intertrigo 05/11/2013   Fibromyalgia 02/14/2013   Diabetic peripheral neuropathy (HCC) 02/14/2013   DM type 2 with diabetic peripheral neuropathy (HCC) 03/01/2011   Diabetic gastroparesis associated with type 2 diabetes mellitus (HCC) 11/07/2010   Pancreatitis, recurrent 11/07/2010   Chronic cough 09/25/2010   Depression 08/14/2009   Chronic pain syndrome 08/14/2009   GERD (gastroesophageal reflux disease) 02/11/2008   URINARY INCONTINENCE 10/21/2006   Social History   Socioeconomic History   Marital status: Widowed    Spouse name: Not on file   Number of children: Not on file   Years of education: Not on file   Highest education level: Not on file  Occupational History   Not on file  Tobacco Use   Smoking status: Some Days    Packs/day: 0.20    Years: 30.00    Additional pack years: 0.00    Total pack years: 6.00    Types: Cigarettes    Start date: 04/22/1975   Smokeless tobacco: Never   Tobacco comments:    started at age 27 - quit for several years.  Restarted with death of child.  recently quit for days at a time.  currently reports 0-3 cigs per day. recent in crease to 1/2 pack d/t death in family  Vaping Use   Vaping Use: Never used  Substance and Sexual Activity   Alcohol use: No    Alcohol/week: 0.0 standard drinks of alcohol   Drug use: No   Sexual activity: Never  Other Topics Concern   Not on file  Social History Narrative   Wants providers to know that she loves the lord and goes to church and is tired of being sick      Strengths/assets: likes to be on the go, enjoys taking care of others, strong family ties,  enjoys keeping a tidy home   Social Determinants of Health   Financial Resource Strain: Low Risk  (08/15/2022)   Overall Financial Resource Strain (CARDIA)    Difficulty of Paying Living Expenses: Not hard at all  Food  Insecurity: No Food Insecurity (08/15/2022)   Hunger Vital Sign    Worried About Running Out of Food in the Last Year: Never true    Ran Out of Food in the Last Year: Never true  Transportation Needs: No Transportation Needs (08/15/2022)   PRAPARE - Administrator, Civil Service (Medical): No    Lack of Transportation (Non-Medical): No  Physical Activity: Inactive (08/15/2022)   Exercise Vital Sign    Days of Exercise per Week: 0 days    Minutes of Exercise per Session: 0 min  Stress: Stress Concern Present (08/15/2022)   [employer] of Occupational Health - Occupational Stress Questionnaire    Feeling of Stress : Rather much  Social Connections: Moderately Isolated (08/15/2022)   Social Connection and Isolation Panel [NHANES]    Frequency of Communication with Friends and Family: More than three times a week    Frequency of Social Gatherings with Friends and Family: More than three times a week    Attends Religious Services: 1 to 4 times per year    Active Member of [employer] or Organizations: No    Attends Banker Meetings: Never    Marital Status: Widowed  Intimate Partner Violence: Not At Risk (08/15/2022)   Humiliation, Afraid, Rape, and Kick questionnaire    Fear of Current or Ex-Partner: No    Emotionally Abused: No    Physically Abused: No    Sexually Abused: No    Ms. Getty's family history includes Cancer in her brother; Colon cancer in her brother; Diabetes in her brother, mother, sister, and sister; Esophageal cancer in her brother; Esophagitis in her father; Heart attack in her daughter; Heart attack (age of onset: 29) in her mother; Heart disease in her father and mother; Mental retardation in her daughter; Stroke in her  mother.      Objective:    Vitals:   08/28/22 0824  BP: (!) 160/80  Pulse: 88    Physical Exam Well-developed well-nourished older African-American female in no acute distress.  Height, Weight, 200 BMI 35.4 HEENT; nontraumatic normocephalic, EOMI, PE R LA, sclera anicteric. Oropharynx; not examined today Neck; supple, no JVD Cardiovascular; regular rate and rhythm with S1-S2, no murmur rub or gallop Pulmonary; Clear bilaterally Abdomen; soft, no focal tenderness, nondistended, no palpable mass or hepatosplenomegaly, bowel sounds are active Rectal; not done today Skin; benign exam, no jaundice rash or appreciable lesions Extremities; no clubbing cyanosis or edema skin warm and dry Neuro/Psych; alert and oriented x4, grossly nonfocal mood and affect appropriate        Assessment & Plan:   #19 72 year old African-American female with 2 to 13-month history of increased abdominal discomfort, nonfocal, and increase in constipation  After discussing with patient I suspect that some of her recent migrating abdominal discomfort may be stress/grief induced  #2 chronic GERD, increased symptoms recently and recurrent solid food dysphagia. She does have history of grade a esophagitis, and was empirically dilated with last EGD April 2023  #3 history of adenomatous colon polyps-due for follow-up colonoscopy last done April 2021 with removal of 3 tubular adenomas, largest 10 mm  #4 COPD-no oxygen use #5.  Coronary artery disease #6.  Congestive heart failure-last echo 2016 EF 65 to 70%, no AAS #7.  Gastroparesis stable #8.  Depression #9 hypertension  Plan; Patient will be scheduled for colonoscopy and EGD with possible esophageal dilation with Dr. Adela Lank.  Both procedures were discussed in detail with the patient including indications risk and  benefits and she is agreeable to proceed. Continue Protonix 40 mg p.o. twice daily AC-refills sent Stop Bentyl Continue metoclopramide 5 mg  p.o. 3 times daily AC-refills sent Will start a trial of Linzess 145 mcg daily for constipation.  She was given samples today and is asked to call back in about 2 weeks and speak to my nurse with an update.  If Linzess is helping we can send a prescription, if not consider dosage increase or alternate drug.   Jeromie Gainor Oswald Hillock PA-C 08/28/2022   Cc: Littie Deeds, MD

## 2022-08-28 NOTE — Patient Instructions (Addendum)
_______________________________________________________  If your blood pressure at your visit was 140/90 or greater, please contact your primary care physician to follow up on this. _______________________________________________________  If you are age 74 or older, your body mass index should be between 23-30. Your Body mass index is 35.43 kg/m. If this is out of the aforementioned range listed, please consider follow up with your Primary Care Provider. ________________________________________________________  The [facility] providers would like to encourage you to use [facility] to communicate with providers for non-urgent requests or questions.  Due to long hold times on the telephone, sending your provider a message by [facility] may be a faster and more efficient way to get a response.  Please allow 48 business hours for a response.  Please remember that this is for non-urgent requests.  _______________________________________________________  Bonita Quin have been scheduled for an endoscopy and colonoscopy. Please follow the written instructions given to you at your visit today. Please pick up your prep supplies at the pharmacy within the next 1-3 days. If you use inhalers (even only as needed), please bring them with you on the day of your procedure.  We have sent the following medications to your pharmacy for you to pick up at your convenience: Reglan, Protonix, Zofran, Suprep  We have given you samples of Linzess 145 mcg one capsule daily. [id number], UVO#[id number], exp 03-2023)  Linzess works best when taken once a day every day, on an empty stomach, at least 30 minutes before your first meal of the day.  When Linzess is taken daily as directed:  *Constipation relief is typically felt in about a week *IBS-C patients may begin to experience relief from belly pain and overall abdominal symptoms (pain, discomfort, and bloating) in about 1 week,   with symptoms typically improving over 12  weeks.  Diarrhea may occur in the first 2 weeks -keep taking it.  The diarrhea should go away and you should start having normal, complete, full bowel movements. It may be helpful to start treatment when you can be near the comfort of your own bathroom, such as a weekend.   Please contact our office if Linzess is helpful and we will send in a prescription to the pharmacy.   Due to recent changes in healthcare laws, you may see the results of your imaging and laboratory studies on MyChart before your provider has had a chance to review them.  We understand that in some cases there may be results that are confusing or concerning to you. Not all laboratory results come back in the same time frame and the provider may be waiting for multiple results in order to interpret others.  Please give [place] 48 hours in order for your provider to thoroughly review all the results before contacting the office for clarification of your results.   Thank you for entrusting me with your care and choosing [facility].  Amy Esterwood, PA-C

## 2022-08-28 NOTE — Progress Notes (Signed)
Agree with assessment and plan as outlined.  

## 2022-08-29 DIAGNOSIS — R102 Pelvic and perineal pain: Secondary | ICD-10-CM | POA: Diagnosis not present

## 2022-08-29 DIAGNOSIS — N3010 Interstitial cystitis (chronic) without hematuria: Secondary | ICD-10-CM | POA: Diagnosis not present

## 2022-08-29 DIAGNOSIS — R6889 Other general symptoms and signs: Secondary | ICD-10-CM | POA: Diagnosis not present

## 2022-08-29 DIAGNOSIS — R3915 Urgency of urination: Secondary | ICD-10-CM | POA: Diagnosis not present

## 2022-09-09 ENCOUNTER — Encounter: Payer: Self-pay | Admitting: *Deleted

## 2022-09-10 ENCOUNTER — Telehealth: Payer: Self-pay | Admitting: *Deleted

## 2022-09-10 NOTE — Patient Outreach (Signed)
  Care Coordination   09/10/2022 Name: [name] [name] MRN: [id number] DOB: 1950/05/21   Care Coordination Outreach Attempts:  An unsuccessful telephone outreach was attempted today to offer the patient information about available care coordination services.  Follow Up Plan:  Additional outreach attempts will be made to offer the patient care coordination information and services.   Encounter Outcome:  No Answer   Care Coordination Interventions:  No, not indicated    Reece Levy, MSW, LCSW Clinical Social Worker [facility] [phone]

## 2022-09-18 ENCOUNTER — Other Ambulatory Visit: Payer: Self-pay

## 2022-09-18 NOTE — Patient Outreach (Signed)
Aging Gracefully Program  09/18/2022  [name] [name] December 30, 1950 [id number]   [facility] Evaluation Interviewer attempted to call patient on today regarding Aging Gracefully referral. No answer from patient after multiple rings. CMA left confidential voicemail for patient to return call.  Will attempt to call back within 1 week.   Vanice Sarah Care Management Assistant [phone]

## 2022-09-23 ENCOUNTER — Telehealth: Payer: Self-pay | Admitting: Physician Assistant

## 2022-09-23 ENCOUNTER — Other Ambulatory Visit: Payer: Self-pay

## 2022-09-23 NOTE — Telephone Encounter (Signed)
Patient aware that instructions for endoscopy/colonoscopy scheduled for 10-09-22 have been added to mail.  Patient advised if she has not received instructions by 10-03-22 to contact our office.  Patient agreed to plan and verbalized understanding.  No further questions.

## 2022-09-23 NOTE — Telephone Encounter (Signed)
Patient called stating she needs another instructions packet sent to her for procedures on 6/20. Please advise, thank you.

## 2022-09-23 NOTE — Patient Outreach (Signed)
Aging Gracefully Program  09/23/2022  [name] [name] [birth year] [id number]   [facility] Evaluation Interviewer made contact with patient. Aging Gracefully survey completed.   Interviewer will send referral to RN and OT for follow up.   Vanice Sarah Care Management Assistant [phone]

## 2022-09-26 ENCOUNTER — Telehealth: Payer: Self-pay | Admitting: *Deleted

## 2022-09-26 NOTE — Progress Notes (Signed)
  Care Coordination Note  09/26/2022 Name: [name] [name] MRN: [id number] DOB: Apr 29, 1950  [name] [name] is a 72 y.o. year old female who is a primary care patient of Littie Deeds, MD and is actively engaged with the care management team. I reached out to [facility] by phone today to assist with re-scheduling a follow up visit with the Licensed Clinical Social Worker  Follow up plan: Unsuccessful telephone outreach attempt made. A HIPAA compliant phone message was left for the patient providing contact information and requesting a return call.   [facility]  Care Coordination Care Guide  Direct Dial: [phone]

## 2022-09-30 ENCOUNTER — Other Ambulatory Visit: Payer: Self-pay | Admitting: Occupational Therapy

## 2022-09-30 NOTE — Patient Instructions (Signed)
Goals Addressed             This Visit's Progress    Patient Stated       She would like to feel more safe in her bathrooms. Downstairs bath could benefit from a higher toilet with grab bars on each side, widen door way, and fixing the sink so that it does not pull away/fall off the wall). If she is able to get up the inside steps safely then the full bath at the top of the steps could also use a higher toilet (with 3n1 or toilet handles) and a step in shower with hand held shower with lower holder, grab bars, and a shower seat.     Patient Stated       She would like to feel more safe in and out of her front and back doors. Front: ramp that leads up to flush with entry into house and then a side ramp to go onto the rest of her porch. Back: Look at [employer] for safety.     Patient Stated       Safety and ability to go up and down her inside steps. Grab bar on the right hand side as you go up (one top of the half wall that is 1/2 way up and then on the wall the rest of the way up). Pt asked about a chair lift--will put on request list but made her aware this more than likely will not be a possibility,     Patient Stated       She would like to feel more safe getting around in her house and when she is out and about (will look into the possibility of a rollator).

## 2022-09-30 NOTE — Patient Outreach (Signed)
Aging Gracefully Program  OT Initial Visit  09/30/2022  [name] [name] 10-03-1950 [id number]  Visit:  1- Initial Visit  Start Time:  1500 End Time:  1655 Total Minutes:  115  CCAP: Typical Daily Routine: Typical Daily Routine:: varies, sometimes up early and other times up late (depends on pain and appointments What Types Of Care Problems Are You Having Throughout The Day?: Can't get up steps to full bathroom, pain intermittently interferes with ADLs and IADLs, not eating much What Kind Of Help Do You Receive?: grand daughter that lives with her does most of IADLs and assists with BADLS as needed What Do You Think Would Make Everyday Life Easier For You?: rollator, being able to get up steps to bedroom and full bathroom What Is A Good Day Like?: less pain What Is A Bad Day Like?: more pain Do You Have Time For Yourself?: yes Patient Reported Equipment: Patient Reported Equipment Currently Used: [employer] Functional Mobility-Walk A Block: Walk A Block: Unable To Do Functional Mobility-Stooping, Crouching, Kneeling To Retreive Item: Stooping, Crouching, or Kneeling To Retrieve Item: Unable To Do Functional Mobility-Bending From Standing Position To Pick Up Clothing Off The Floor: Bending Over From Standing Position To Pick Up Clothing Off The Floor: Unable To Do  Functional Mobility-Climb 1 Flight Of Stairs: Climb 1 Flight Of Stairs: Unable To Do Do You:: Use Both A Device And Personal Assistance Importance Of Learning New Strategies:: Very Much Other Comments:: wonders if a back brace would give her enough support to make her feel comfortable going up and down steps--informed her I was not sure that she would have to talke to her primary care doctor about a referral to orthotics company for that to be assessed. Also the more I ponder this a HHPT consult would be of benefit to further assess this in relation to abiltiy to safely do steps Observation: Climb One Flight Of Stairs:  N/O Safety:  (she says she cannot due back pain) Intervention: Yes Other Comments:: HHPT, possibly a back brace if PT feels it may help, pt would really like a stairlift Activities of Daily Living-Put On And Take Off Shirt/Dress/Coat (Incl. Fasteners): Put On And Take Off Shirt/Dress/Coat (Incl. Fasteners): A Little Difficulty (dresses only (that fasten in the back)) Other Comments:: grand daughter assists as needed Instrumental Activities of Daily Living-Light Homemaking (Laundry, Straightening Up, Vacuuming):  Do Light Homemaking (Laundry, Straightening Up, Vacuuming): Unable To Do Other Comments:: grand daughter does this Instrumental Activities of Daily Living-Making A Bed: Making a Bed: Unable To Do Other Comments:: grand daughter does this Instrumental Activities of Daily Living-Washing Dishes By Hand While Standing At The Sink: Washing Dishes By Hand While Standing At The Sink: Unable To Do Other Comments:: grand daughter does this Instrumental Activities of Daily Living-Grocery Shopping: Do Grocery Shopping: Unable To Do Other Comments:: grand daughter does this Instrumental Activities of Daily Living-Meal Preparation and Clean-Up: Meal Preparation and Clean-Up: Unable To Do Other Comments:: grand daughter does this  Readiness To Change Score:  Readiness to Change Score: 8  Home Environment Assessment: Outside Home Entry:: At front she has step up onto concrete porch and then a step up into the house. She does have any railings. Back door--deck needs some work to make is safe. Dining Room:: There are areas of linoleum that are missing Kitchen:: Sink leaks, there is a spot on the ceiling from a leak that supposedly came from the bathrom attached to grand-daughter's room Stairs:: She has a flight of steps  to second level that she currently does not feel safe in trying because of her back pain. There is only 1 rail on the left as you go up them. Bathroom:: 1/2 bath downstairs has  low toilet and toilet riser with handles. Client reports sink is coming off of wall as well. Upstairs the hall bath has a standard toilet and tub/shower combo. 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 The electricity in grand-daughters room does not work as well as some other outlets in house as well. Tub and sink in grand-daughters bathroom do not drain properly. Back deck boards need looking at. Hospital bed needs a wheel, holes in linoleum in kitchen.  Goals:  Goals Addressed             This Visit's Progress    Patient Stated       She would like to feel more safe in her bathrooms. Downstairs bath could benefit from a higher toilet with grab bars on each side, widen door way, and fixing the sink so that it does not pull away/fall off the wall). If she is able to get up the inside steps safely then the full bath at the top of the steps could also use a higher toilet (with 3n1 or toilet handles) and a step in shower with hand held shower with lower holder, grab bars, and a shower seat.     Patient Stated       She would like to feel more safe in and out of her front and back doors. Front: ramp that leads up to flush with entry into house and then a side ramp to go onto the rest of her porch. Back: Look at [employer] for safety.     Patient Stated       Safety and ability to go up and down her inside steps. Grab bar on the right hand side as you go up (one top of the half wall that is 1/2 way up and then on the wall the rest of the way up). Pt asked about a chair lift--will put on request list but made her aware this more than  likely will not be a possibility,     Patient Stated       She would like to feel more safe getting around in her house and when she is out and about (will look into the possibility of a rollator).        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 Her grand-daughter, [name] does all of the IADLs and takes care of her daughter, Kristen Cardinal (turning 5 on 10/07/2022). Downstairs is only a 1/2  bath so she can only sponge bathe. Client View Of His/Her Situation:: She wishes she could do more for herself, grand-daughter, and great-grand-daughter that live with her, but because of her chronic pain she cannot. She has had alot going on the past 6 months with death of her brother and grandson. She is content to stay downstairs but really wishes she could get up stairs so she can take a real bath/shower in the the full bathroom. Next Visit Plan:: Maybe a rollator  [place], [place] Aging Gracefully [phone]

## 2022-10-06 ENCOUNTER — Telehealth: Payer: Self-pay

## 2022-10-06 NOTE — Telephone Encounter (Signed)
Pt calling to request a referral to the [facility] for Rt breast pain and itching in breast. Please let pt know when this has been done so she can call and schedule an appt. Pt number is [phone].  .memd

## 2022-10-07 NOTE — Telephone Encounter (Signed)
Please advise her to come in for an appointment for further evaluation and further referral can be discussed if necessary.

## 2022-10-08 NOTE — Progress Notes (Signed)
  Care Coordination Note  10/08/2022 Name: [name] [name] MRN: [id number] DOB: [birth year]  [name] [name] is a 72 y.o. year old female who is a primary care patient of Littie Deeds, MD and is actively engaged with the care management team. I reached out to [facility] by phone today to assist with re-scheduling an initial visit with the Licensed Clinical Social Worker  Follow up plan: Telephone appointment with care management team member scheduled for:10/14/22  [facility]  Care Coordination Care Guide  Direct Dial: [phone]

## 2022-10-08 NOTE — Telephone Encounter (Signed)
Attempted to reach patient to make appt. Phone went to voicemail. Will try again. Aquilla Solian, CMA

## 2022-10-09 ENCOUNTER — Encounter: Payer: Self-pay | Admitting: Gastroenterology

## 2022-10-09 ENCOUNTER — Telehealth: Payer: Self-pay

## 2022-10-09 ENCOUNTER — Ambulatory Visit (AMBULATORY_SURGERY_CENTER): Payer: Medicare HMO | Admitting: Gastroenterology

## 2022-10-09 VITALS — BP 166/79 | HR 79 | Temp 98.0°F | Resp 29 | Ht 63.0 in | Wt 200.0 lb

## 2022-10-09 DIAGNOSIS — R1310 Dysphagia, unspecified: Secondary | ICD-10-CM | POA: Diagnosis not present

## 2022-10-09 DIAGNOSIS — M797 Fibromyalgia: Secondary | ICD-10-CM | POA: Diagnosis not present

## 2022-10-09 DIAGNOSIS — Z09 Encounter for follow-up examination after completed treatment for conditions other than malignant neoplasm: Secondary | ICD-10-CM | POA: Diagnosis not present

## 2022-10-09 DIAGNOSIS — Z86010 Personal history of colonic polyps: Secondary | ICD-10-CM | POA: Diagnosis not present

## 2022-10-09 DIAGNOSIS — I509 Heart failure, unspecified: Secondary | ICD-10-CM | POA: Diagnosis not present

## 2022-10-09 DIAGNOSIS — D123 Benign neoplasm of transverse colon: Secondary | ICD-10-CM

## 2022-10-09 DIAGNOSIS — E119 Type 2 diabetes mellitus without complications: Secondary | ICD-10-CM | POA: Diagnosis not present

## 2022-10-09 DIAGNOSIS — B3781 Candidal esophagitis: Secondary | ICD-10-CM | POA: Diagnosis not present

## 2022-10-09 DIAGNOSIS — F32A Depression, unspecified: Secondary | ICD-10-CM | POA: Diagnosis not present

## 2022-10-09 DIAGNOSIS — F419 Anxiety disorder, unspecified: Secondary | ICD-10-CM | POA: Diagnosis not present

## 2022-10-09 MED ORDER — FLUCONAZOLE 100 MG PO TABS
ORAL_TABLET | ORAL | 0 refills | Status: AC
Start: 2022-10-09 — End: 2022-10-23

## 2022-10-09 MED ORDER — SODIUM CHLORIDE 0.9 % IV SOLN
500.0000 mL | INTRAVENOUS | Status: DC
Start: 2022-10-09 — End: 2022-10-09

## 2022-10-09 NOTE — Progress Notes (Signed)
Uneventful anesthetic. Report to pacu rn. Vss. Care resumed by rn. 

## 2022-10-09 NOTE — Progress Notes (Signed)
Called to room to assist during endoscopic procedure.  Patient ID and intended procedure confirmed with present staff. Received instructions for my participation in the procedure from the performing physician.  

## 2022-10-09 NOTE — Patient Instructions (Addendum)
Handout on hemorrhoids, polyps, and post dilation diet given to patient Await pathology results Resume previous diet and continue present medications - pick up prescription for Fluconazole from [facility] pharmacy (take 400 mg X 1 dose, then 200 mg per day for another 13 days to treat candidiasis) Repeat colonoscopy for surveillance will be determined based off of pathology results    YOU HAD AN ENDOSCOPIC PROCEDURE TODAY AT THE [facility]:   Refer to the procedure report that was given to you for any specific questions about what was found during the examination.  If the procedure report does not answer your questions, please call your gastroenterologist to clarify.  If you requested that your care partner not be given the details of your procedure findings, then the procedure report has been included in a sealed envelope for you to review at your convenience later.  YOU SHOULD EXPECT: Some feelings of bloating in the abdomen. Passage of more gas than usual.  Walking can help get rid of the air that was put into your GI tract during the procedure and reduce the bloating. If you had a lower endoscopy (such as a colonoscopy or flexible sigmoidoscopy) you may notice spotting of blood in your stool or on the toilet paper. If you underwent a bowel prep for your procedure, you may not have a normal bowel movement for a few days.  Please Note:  You might notice some irritation and congestion in your nose or some drainage.  This is from the oxygen used during your procedure.  There is no need for concern and it should clear up in a day or so.  SYMPTOMS TO REPORT IMMEDIATELY:  Following lower endoscopy (colonoscopy or flexible sigmoidoscopy):  Excessive amounts of blood in the stool  Significant tenderness or worsening of abdominal pains  Swelling of the abdomen that is new, acute  Fever of 100F or higher  Following upper endoscopy (EGD)  Vomiting of blood or coffee ground material  New  chest pain or pain under the shoulder blades  Painful or persistently difficult swallowing  New shortness of breath  Fever of 100F or higher  Black, tarry-looking stools  For urgent or emergent issues, a gastroenterologist can be reached at any hour by calling [phone]. Do not use MyChart messaging for urgent concerns.    DIET:  We do recommend a small meal at first, but then you may proceed to your regular diet.  Drink plenty of fluids but you should avoid alcoholic beverages for 24 hours.  ACTIVITY:  You should plan to take it easy for the rest of today and you should NOT DRIVE or use heavy machinery until tomorrow (because of the sedation medicines used during the test).    FOLLOW UP: Our staff will call the number listed on your records the next business day following your procedure.  We will call around 7:15- 8:00 am to check on you and address any questions or concerns that you may have regarding the information given to you following your procedure. If we do not reach you, we will leave a message.     If any biopsies were taken you will be contacted by phone or by letter within the next 1-3 weeks.  Please call us at [phone] if you have not heard about the biopsies in 3 weeks.    SIGNATURES/CONFIDENTIALITY: You and/or your care partner have signed paperwork which will be entered into your electronic medical record.  These signatures attest to the fact  that that the information above on your After Visit Summary has been reviewed and is understood.  Full responsibility of the confidentiality of this discharge information lies with you and/or your care-partner.

## 2022-10-09 NOTE — Patient Outreach (Signed)
Aging Gracefully Program  10/09/2022  Estie Hegde 09-Aug-1950 [id number]   Placed call to patient to schedule RN home visit. Spoke with granddaughter. Explained reason for call. Offered home visit on 10/14/2022  at 3pm and granddaughter has accepted.  Confirmed address.  [name] RN, BSN, Careers adviser for [employer] Mobile: [phone]

## 2022-10-09 NOTE — Op Note (Signed)
[facility] Patient Name: [name] [name] Procedure Date: 10/09/2022 1:15 PM MRN: [id number] Endoscopist: Viviann Spare P. Adela Lank , MD, [id number] Age: 72 Referring MD:  Date of Birth: 1950/07/06 Gender: Female Account #: [id number] Procedure:                Colonoscopy Indications:              High risk colon cancer surveillance: Personal                            history of colonic polyps - 3 adenomas removed                            04/2021, largest 1cm in size Medicines:                Monitored Anesthesia Care Procedure:                Pre-Anesthesia Assessment:                           - Prior to the procedure, a History and Physical                            was performed, and patient medications and                            allergies were reviewed. The patient's tolerance of                            previous anesthesia was also reviewed. The risks                            and benefits of the procedure and the sedation                            options and risks were discussed with the patient.                            All questions were answered, and informed consent                            was obtained. Prior Anticoagulants: The patient has                            taken no anticoagulant or antiplatelet agents. ASA                            Grade Assessment: III - A patient with severe                            systemic disease. After reviewing the risks and                            benefits, the patient was deemed in satisfactory  condition to undergo the procedure.                           After obtaining informed consent, the colonoscope                            was passed under direct vision. Throughout the                            procedure, the patient's blood pressure, pulse, and                            oxygen saturations were monitored continuously. The                            Olympus CF-HQ190L  ([id number]) Colonoscope was                            introduced through the anus and advanced to the the                            cecum, identified by appendiceal orifice and                            ileocecal valve. The colonoscopy was performed                            without difficulty. The patient tolerated the                            procedure well. The quality of the bowel                            preparation was adequate. The ileocecal valve,                            appendiceal orifice, and rectum were photographed. Scope In: 1:35:26 PM Scope Out: 1:53:17 PM Scope Withdrawal Time: 0 hours 13 minutes 2 seconds  Total Procedure Duration: 0 hours 17 minutes 51 seconds  Findings:                 The perianal and digital rectal examinations were                            normal.                           A 3 mm polyp was found in the hepatic flexure. The                            polyp was sessile. The polyp was removed with a                            cold snare. Resection and retrieval were complete.  Two sessile polyps were found in the transverse                            colon. The polyps were 3 to 4 mm in size. These                            polyps were removed with a cold snare. Resection                            and retrieval were complete.                           A 4 mm polyp was found in the splenic flexure. The                            polyp was sessile. The polyp was removed with a                            cold snare. Resection and retrieval were complete.                           Internal hemorrhoids were found during retroflexion.                           There was excessive looping in the right colon.                            Abdominal pressure utilized to achieve cecal                            intubation. The exam was otherwise without                            abnormality. Complications:            No  immediate complications. Estimated blood loss:                            Minimal. Estimated Blood Loss:     Estimated blood loss was minimal. Impression:               - One 3 mm polyp at the hepatic flexure, removed                            with a cold snare. Resected and retrieved.                           - Two 3 to 4 mm polyps in the transverse colon,                            removed with a cold snare. Resected and retrieved.                           - One 4 mm polyp  at the splenic flexure, removed                            with a cold snare. Resected and retrieved.                           - Internal hemorrhoids.                           - Looping in the right colon.                           - The examination was otherwise normal. Recommendation:           - Patient has a contact number available for                            emergencies. The signs and symptoms of potential                            delayed complications were discussed with the                            patient. Return to normal activities tomorrow.                            Written discharge instructions were provided to the                            patient.                           - Resume previous diet.                           - Continue present medications.                           - Await pathology results. Viviann Spare P. Hosam Mcfetridge, MD 10/09/2022 1:59:14 PM This report has been signed electronically.
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
## 2022-10-09 NOTE — Op Note (Signed)
[facility] Patient Name: [name] [name] Procedure Date: 10/09/2022 1:16 PM MRN: [id number] Endoscopist: Viviann Spare P. Adela Lank , MD, [id number] Age: 72 Referring MD:  Date of Birth: [birth year] Gender: Female Account #: [id number] Procedure:                Upper GI endoscopy Indications:              Dysphagia - history of empiric dilation in the past                            to 18mm with good relief of symptoms Medicines:                Monitored Anesthesia Care Procedure:                Pre-Anesthesia Assessment:                           - Prior to the procedure, a History and Physical                            was performed, and patient medications and                            allergies were reviewed. The patient's tolerance of                            previous anesthesia was also reviewed. The risks                            and benefits of the procedure and the sedation                            options and risks were discussed with the patient.                            All questions were answered, and informed consent                            was obtained. Prior Anticoagulants: The patient has                            taken no anticoagulant or antiplatelet agents. ASA                            Grade Assessment: III - A patient with severe                            systemic disease. After reviewing the risks and                            benefits, the patient was deemed in satisfactory                            condition to undergo the procedure.  After obtaining informed consent, the endoscope was                            passed under direct vision. Throughout the                            procedure, the patient's blood pressure, pulse, and                            oxygen saturations were monitored continuously. The                            GIF [id number] #[id number] was introduced through the                            mouth,  and advanced to the second part of duodenum.                            The upper GI endoscopy was accomplished without                            difficulty. The patient tolerated the procedure                            well. Scope In: Scope Out: Findings:                 Esophagogastric landmarks were identified: the                            Z-line was found at 40 cm, the gastroesophageal                            junction was found at 40 cm and the upper extent of                            the gastric folds was found at 40 cm from the                            incisors.                           Diffuse, white plaques were found in the distal                            esophagus grossly consistent with esophageal                            candidiasis.                           The exam of the esophagus was otherwise normal.                           A guidewire was placed and the scope was withdrawn.  Empiric dilation was performed in the entire                            esophagus with a Savary dilator with mild                            resistance at 17 mm and 18 mm. Relook endoscopy                            showed no mucosal wrents.                           The entire examined stomach was normal.                           The examined duodenum was normal. Complications:            No immediate complications. Estimated blood loss:                            Minimal. Estimated Blood Loss:     Estimated blood loss was minimal. Impression:               - Esophagogastric landmarks identified.                           - Esophageal plaques were found, consistent with                            candidiasis.                           - Normal esophagus otherwise - dilated empirically                            to 18mm.                           - Normal stomach.                           - Normal examined duodenum. Recommendation:           -  Patient has a contact number available for                            emergencies. The signs and symptoms of potential                            delayed complications were discussed with the                            patient. Return to normal activities tomorrow.                            Written discharge instructions were provided to the  patient.                           - Resume previous diet and await course post                            dilation                           - Continue present medications.                           - Start fluconazole 400mg  x 1 dose, then 200mg  /                            day for another 13 days to treat candidiasis Willaim Rayas. Adela Lank, MD 10/09/2022 2:05:27 PM This report has been signed electronically.

## 2022-10-10 ENCOUNTER — Telehealth: Payer: Self-pay

## 2022-10-10 NOTE — Telephone Encounter (Signed)
Follow up call to pt, lm for pt to call if having any difficulty with normal activities or eating and drinking.  Also to call if any other questions or concerns.  

## 2022-10-14 ENCOUNTER — Other Ambulatory Visit: Payer: Self-pay

## 2022-10-14 ENCOUNTER — Ambulatory Visit: Payer: Self-pay | Admitting: *Deleted

## 2022-10-14 NOTE — Patient Outreach (Signed)
  Care Coordination   Initial Visit Note   10/14/2022 Name: Alexys Lobello MRN: [id number] DOB: 11-10-1950  Archita Lomeli is a 72 y.o. year old female who sees Littie Deeds, MD for primary care. I spoke with  [name] by phone today.  What matters to the patients health and wellness today?  Grief from recent loss of a son in February and a grandson in April.     Goals Addressed             This Visit's Progress    Provide support and resources to aide in pt's grief/depression and assist with financial strains       Activities and task to complete in order to accomplish goals.   Call or go to Department of Social Services- I will mail you material on Medicaid and local walk-in clinic you may want to attend to inquire/apply Call your insurance provider for more information about your Enhanced Benefits (may want to use the [facility] portal) Follow up on food resources: [facility] (select "find help") - Find Food  [place], [place] (ghpfa.org) or download the free app to your smart phone Food and Nutrition Services - [phone] or apply online at [facility] - ePASS Meals on Wheels (over age 6 and homebound) - Geologist, engineering Line at [phone]  Expect assistance with SCAT application  Review material being mailed to you on Grief support and counseling at [employer]- call to schedule your counseling appointment with them when ready. Keep all upcoming appointment discussed today Continue with compliance of taking medication prescribed by Doctor I have placed a referral with Bevelyn Ngo, BSW to assist with SCAT, financial strains, and other as identified         SDOH assessments and interventions completed:  Yes  SDOH Interventions Today    Flowsheet Row Most Recent Value  SDOH Interventions   Food Insecurity Interventions Other (Comment)  Housing Interventions Intervention Not Indicated   Transportation Interventions Intervention Not Indicated  Alcohol Usage Interventions Intervention Not Indicated (Score <7)  Depression Interventions/Treatment  Medication, Counseling  Financial Strain Interventions Other (Comment)  [receives food stamps]        Care Coordination Interventions:  Yes, provided  Interventions Today    Flowsheet Row Most Recent Value  Chronic Disease   Chronic disease during today's visit Other  [grief/loss with depression]  General Interventions   General Interventions Discussed/Reviewed General Interventions Discussed, General Interventions Reviewed, [employer]  [Pt reports she struggles some with paying bills with limited income- will refer pt to Bevelyn Ngo, BSW, for assistance with financial strains, transportation,etc]  Mental Health Interventions   Mental Health Discussed/Reviewed Mental Health Discussed, Mental Health Reviewed, Coping Strategies, Grief and Loss, Depression  [Pt reports history of depression- on RX. Worsened symptoms since the recent loss of a son and grandson, Interested in Pension scheme manager Counseling at [employer]- will send info to pt to review and pursue when ready.]  Nutrition Interventions   Nutrition Discussed/Reviewed Nutrition Discussed  [Pt reports she gets $105 monthly in Food Stamps]  Advanced Directive Interventions   Advanced Directives Discussed/Reviewed Advanced Directives Discussed, Provided resource for acquiring and filling out documents       Follow up plan: Follow up call scheduled for 10/29/22    Encounter Outcome:  Pt. Visit Completed

## 2022-10-14 NOTE — Patient Instructions (Signed)
Visit Information  Thank you for taking time to visit with me today. Please don't hesitate to contact me if I can be of assistance to you before our next scheduled home appointment.  Following are the goals we discussed today:   Goals Addressed               This Visit's Progress     AG RN (pt-stated)        Goals:  Patient will report decrease in pain in the next 160 days.   062/25/2024 Assessment:   Patient with chronic pain in her back due to injury.  Patient with limited ability to move about in her home due to inability to climb steps.  (Hand railing only on one side of the staircase)  Patient reports she takes her medication for pain ( gabapentin) and uses a heating pad. Interventions:  Reviewed use of ice and or heat.  Reviewed pain control. Reviewed ability to do exercise. Provided a [facility] calendar and reviewed the tool. Wrote all pending appointments in calendar. Encouraged patient to call MD for an appointment ( way past due). Encouraged patient to get prescriptions filled. Reviewed with patient to call the [facility] Customer service number to get assistance with a new CBG meter.  Plan: next home visit planned for 11/11/2022  CLIENT/RN ACTION PLAN - PAIN  Registered Nurse:  [name] RN   Date:    10/14/2022  Client Name:  [name] Client ID:    Target Area:  PAIN   Why Problem May Occur: Previous back injury   Target Goal:  Patient will report decrease in back pain  in the next 160 days.   STRATEGIES Coping Strategies Ideas:  Heat  Reviewed 10/14/2022 Use heating pad or warm towel on painful area no more than 20 minutes at a time. Don't sleep with a heating pad on - it could burn your skin   Ice  Reviewed 10/14/2022 Use ice pack or frozen bag of vegetables on painful area.   Leave cold pack on for less than 20 minutes. Ice can burn your skin.  Don't leave ice on longer than 20 minutes.   Activity and Exercise  Will plan to review at next home visit on  11/11/2022 Joints get stiff when not in use Aging Gracefully Exercises Walking (inside or outside) [employer]:  cooking, cleaning, Building surveyor   Listen to Music Listening to music can decrease pain. Turn off the TV and turn on the radio.   Prayer/Meditation Prayer and meditation can decrease pain   Other   Acetaminophen/Tylenol (same medication) DO NOT take more Acetaminophen than below because it can be bad for your liver. 500 mg. tablets:  2 tablets every 8 hours, as needed for pain.  Do not take more than 6, (500 mg) tablets every day. 300 mg. tablets:  2 tablets every 6 hours, as needed for pain.  Do not take more than 8 (325 mg) tablets every day. Look for Acetaminophen/Tylenol in other medicines you buy over the counter.   Still in Pain There ae a lot of different kinds of pain medicines:  creams, patches and supplements. Ask your Healthcare Provider about other pain medications.   Stop Smoking Smoking can make arthritis worse.   Stop Pain Reviewed 10/14/2022 Before it gets bad. Once in pain It is harder to get rid of. Begin pain relief while you have mild pain.   Other   Other   ;  PRACTICE It is  important to practice the strategies so we can determine if they will be effective in helping to reach the goal.    Follow these specific recommendations:        If strategy does not work the first time, try it again.  We may make some changes over the next few sessions.    We may make some changes over the next few sessions, based on how they work.  [name] RN, BSN, CEN RN Case Production designer, theatre/television/film for Aging Gracefully [facility] Mobile: [phone]          Our next appointment is on 11/11/2022 at 2pm  If you are experiencing a Mental Health or Behavioral Health Crisis or need someone to talk to, please call the Suicide and Crisis Lifeline: 988 call the [place] National Suicide Prevention Lifeline: (310)857-1924 or TTY: (709)849-3672  TTY [phone]) to talk to a trained counselor call 1-800-273-TALK (toll free, 24 hour hotline) go to [facility] [address], [place] [phone]) call 911   The patient verbalized understanding of instructions, educational materials, and care plan provided today and agreed to receive a mailed copy of patient instructions, educational materials, and care plan.   [name] RN, BSN, Careers adviser for [employer] Mobile: [phone]
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
## 2022-10-15 ENCOUNTER — Ambulatory Visit: Payer: Self-pay

## 2022-10-15 NOTE — Patient Instructions (Signed)
Visit Information  Thank you for taking time to visit with me today. Please don't hesitate to contact me if I can be of assistance to you.   Following are the goals we discussed today:  - Speak with your primary care provider to request a Rollator   If you are experiencing a Mental Health or Behavioral Health Crisis or need someone to talk to, please call 1-800-273-TALK (toll free, 24 hour hotline) call 911  The patient verbalized understanding of instructions, educational materials, and care plan provided today and DECLINED offer to receive copy of patient instructions, educational materials, and care plan.   Bevelyn Ngo, BSW, CDP Social Worker, Certified Dementia Practitioner [facility] Care Management  Care Coordination [phone]

## 2022-10-15 NOTE — Patient Outreach (Signed)
  Care Coordination   Follow Up Visit Note   10/15/2022 Name: [name] [name] MRN: [id number] DOB: 10/06/1950  Falesha Schommer is a 72 y.o. year old female who sees Littie Deeds, MD for primary care. I spoke with  [name] by phone today.  What matters to the patients health and wellness today?  To obtain transportation services via SCAT    Goals Addressed             This Visit's Progress    Care Coordination Activities       Care Coordination Interventions: Collaboration with LCSW Reece Levy who requests assistance with completion of a SCAT application Determined the patient is currently using her health plan transportation benefit as well as a taxi when needed. She had SCAT in the past and would like to obtain this service again Assisted the patient with completing and submitting Part A of the application to [employer] with LCSW regarding the completion of Part B Advised the patient once Part B is submitted this BSW will contact her to advise  Discussed the patient would like to obtain a rollator - noted patient has an appointment with her primary care providers office next week. Advised she request a rollator be ordered during her OV. Patient stated understanding         SDOH assessments and interventions completed:  No     Care Coordination Interventions:  Yes, provided   Interventions Today    Flowsheet Row Most Recent Value  Chronic Disease   Chronic disease during today's visit Other  [Diabtic Peripheral Neuropathy, Spinal Stenosis, Transportation]  General Interventions   General Interventions Discussed/Reviewed General Interventions Discussed, Programmer, applications, Communication with, Horticulturist, commercial (DME)  Durable Medical Equipment (DME) --  [pt has a cane and would like a rollator]  Communication with Social Work        Follow up plan:  SW will continue to follow    Encounter Outcome:  Pt. Visit Completed    Bevelyn Ngo, Kenard Gower, CDP Social Worker, Certified Dementia Practitioner [facility] Care Management  Care Coordination [phone]

## 2022-10-17 ENCOUNTER — Encounter: Payer: Self-pay | Admitting: Gastroenterology

## 2022-10-20 NOTE — Patient Outreach (Signed)
  Care Coordination    10/20/2022 Name: Alandrea Suleman MRN: [id number] DOB: 1950-06-07  Jaeonna Vanderstelt is a 72 y.o. year old female who sees [name], MD for primary care. I  submitted Part B of patients SCAT application to [facility] Access.  What matters to the patients health and wellness today?  SW did not speak with the patient today.    SDOH assessments and interventions completed:  No     Care Coordination Interventions:  Yes, provided   Interventions Today    Flowsheet Row Most Recent Value  Chronic Disease   Chronic disease during today's visit Other  [Diabetic Peripheral Neuropathy, Spinal Stenosis, Transportation]  General Interventions   General Interventions Discussed/Reviewed Community Resources  [Submitted SCAT application to [place] Access]        Follow up plan:  SW will continue to follow.    Encounter Outcome:  Pt. Visit Completed   Bevelyn Ngo, BSW, CDP Social Worker, Certified Dementia Practitioner [facility] Care Management  Care Coordination [phone]

## 2022-10-21 DIAGNOSIS — H04123 Dry eye syndrome of bilateral lacrimal glands: Secondary | ICD-10-CM | POA: Diagnosis not present

## 2022-10-21 DIAGNOSIS — H20011 Primary iridocyclitis, right eye: Secondary | ICD-10-CM | POA: Diagnosis not present

## 2022-10-21 DIAGNOSIS — H0102B Squamous blepharitis left eye, upper and lower eyelids: Secondary | ICD-10-CM | POA: Diagnosis not present

## 2022-10-21 DIAGNOSIS — H4043X4 Glaucoma secondary to eye inflammation, bilateral, indeterminate stage: Secondary | ICD-10-CM | POA: Diagnosis not present

## 2022-10-21 DIAGNOSIS — H10413 Chronic giant papillary conjunctivitis, bilateral: Secondary | ICD-10-CM | POA: Diagnosis not present

## 2022-10-21 DIAGNOSIS — H18513 Endothelial corneal dystrophy, bilateral: Secondary | ICD-10-CM | POA: Diagnosis not present

## 2022-10-21 DIAGNOSIS — B0052 Herpesviral keratitis: Secondary | ICD-10-CM | POA: Diagnosis not present

## 2022-10-21 DIAGNOSIS — H0102A Squamous blepharitis right eye, upper and lower eyelids: Secondary | ICD-10-CM | POA: Diagnosis not present

## 2022-10-21 DIAGNOSIS — Z961 Presence of intraocular lens: Secondary | ICD-10-CM | POA: Diagnosis not present

## 2022-10-21 DIAGNOSIS — R6889 Other general symptoms and signs: Secondary | ICD-10-CM | POA: Diagnosis not present
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
## 2022-10-24 ENCOUNTER — Ambulatory Visit: Payer: Medicare HMO | Admitting: Family Medicine

## 2022-10-27 ENCOUNTER — Telehealth: Payer: Self-pay

## 2022-10-27 NOTE — Patient Outreach (Signed)
Aging Gracefully Program  10/27/2022  [name] [name] 10-25-1950 [id number]   Placed call to patient to change scheduled home visit. Appointment changed to 11/14/2022. Patient agreed.   [name] RN, BSN, Careers adviser for [employer] Mobile: [phone]

## 2022-10-29 ENCOUNTER — Encounter: Payer: Self-pay | Admitting: *Deleted

## 2022-10-29 ENCOUNTER — Telehealth: Payer: Self-pay | Admitting: *Deleted

## 2022-10-29 NOTE — Patient Outreach (Signed)
  Care Coordination   10/29/2022 Name: [name] [name] MRN: [id number] DOB: Nov 09, 1950   Care Coordination Outreach Attempts:  A second unsuccessful outreach was attempted today to offer the patient with information about available care coordination services.  Follow Up Plan:  Additional outreach attempts will be made to offer the patient care coordination information and services.   Encounter Outcome:  No Answer   Care Coordination Interventions:  No, not indicated    Reece Levy, MSW, LCSW Clinical Social Worker [facility] [phone]

## 2022-10-31 ENCOUNTER — Ambulatory Visit: Payer: Medicare HMO | Admitting: Family Medicine

## 2022-10-31 DIAGNOSIS — R6889 Other general symptoms and signs: Secondary | ICD-10-CM | POA: Diagnosis not present
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
## 2022-11-05 ENCOUNTER — Ambulatory Visit: Payer: Medicare HMO | Admitting: Physician Assistant
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
## 2022-11-07 ENCOUNTER — Ambulatory Visit (INDEPENDENT_AMBULATORY_CARE_PROVIDER_SITE_OTHER): Payer: Medicare HMO | Admitting: Family Medicine

## 2022-11-07 ENCOUNTER — Encounter: Payer: Self-pay | Admitting: Family Medicine

## 2022-11-07 VITALS — BP 170/90 | HR 71 | Ht 63.0 in | Wt 186.0 lb

## 2022-11-07 DIAGNOSIS — Z9109 Other allergy status, other than to drugs and biological substances: Secondary | ICD-10-CM | POA: Diagnosis not present

## 2022-11-07 DIAGNOSIS — M5417 Radiculopathy, lumbosacral region: Secondary | ICD-10-CM | POA: Diagnosis not present

## 2022-11-07 DIAGNOSIS — M4807 Spinal stenosis, lumbosacral region: Secondary | ICD-10-CM

## 2022-11-07 DIAGNOSIS — R6889 Other general symptoms and signs: Secondary | ICD-10-CM | POA: Diagnosis not present

## 2022-11-07 DIAGNOSIS — L509 Urticaria, unspecified: Secondary | ICD-10-CM | POA: Diagnosis not present

## 2022-11-07 DIAGNOSIS — E1142 Type 2 diabetes mellitus with diabetic polyneuropathy: Secondary | ICD-10-CM

## 2022-11-07 DIAGNOSIS — J4489 Other specified chronic obstructive pulmonary disease: Secondary | ICD-10-CM | POA: Diagnosis not present

## 2022-11-07 DIAGNOSIS — B353 Tinea pedis: Secondary | ICD-10-CM

## 2022-11-07 DIAGNOSIS — B3781 Candidal esophagitis: Secondary | ICD-10-CM

## 2022-11-07 LAB — POCT GLYCOSYLATED HEMOGLOBIN (HGB A1C): HbA1c, POC (controlled diabetic range): 8.9 % — AB (ref 0.0–7.0)

## 2022-11-07 MED ORDER — NOVOLOG FLEXPEN 100 UNIT/ML ~~LOC~~ SOPN
5.0000 [IU] | PEN_INJECTOR | Freq: Every day | SUBCUTANEOUS | 3 refills | Status: DC
Start: 2022-11-07 — End: 2022-11-07

## 2022-11-07 MED ORDER — TRIAMCINOLONE ACETONIDE 0.1 % EX OINT: 1.0000 | TOPICAL_OINTMENT | Freq: Two times a day (BID) | CUTANEOUS | 1 refills | Status: DC

## 2022-11-07 MED ORDER — ALBUTEROL SULFATE HFA 108 (90 BASE) MCG/ACT IN AERS
2.0000 | INHALATION_SPRAY | Freq: Four times a day (QID) | RESPIRATORY_TRACT | 2 refills | Status: DC | PRN
Start: 2022-11-07 — End: 2023-03-13

## 2022-11-07 MED ORDER — CETIRIZINE HCL 10 MG PO TABS: 10.0000 mg | ORAL_TABLET | Freq: Every day | ORAL | 11 refills | Status: DC

## 2022-11-07 MED ORDER — ALBUTEROL SULFATE (2.5 MG/3ML) 0.083% IN NEBU
2.5000 mg | INHALATION_SOLUTION | Freq: Four times a day (QID) | RESPIRATORY_TRACT | 1 refills | Status: DC | PRN
Start: 2022-11-07 — End: 2023-06-15

## 2022-11-07 MED ORDER — LANTUS SOLOSTAR 100 UNIT/ML ~~LOC~~ SOPN
14.0000 [IU] | PEN_INJECTOR | Freq: Every day | SUBCUTANEOUS | 2 refills | Status: DC
Start: 2022-11-07 — End: 2023-01-05

## 2022-11-07 MED ORDER — GABAPENTIN 300 MG PO CAPS
600.0000 mg | ORAL_CAPSULE | Freq: Three times a day (TID) | ORAL | 2 refills | Status: DC
Start: 2022-11-07 — End: 2023-02-12

## 2022-11-07 MED ORDER — NYSTATIN 100000 UNIT/ML MT SUSP
200000.0000 [IU] | Freq: Four times a day (QID) | OROMUCOSAL | 1 refills | Status: DC
Start: 2022-11-07 — End: 2023-03-27

## 2022-11-07 MED ORDER — TERBINAFINE HCL 1 % EX CREA: 1.0000 | TOPICAL_CREAM | Freq: Two times a day (BID) | CUTANEOUS | 0 refills | Status: DC

## 2022-11-07 NOTE — Patient Instructions (Addendum)
Please take 14 units of Lantus daily and check your sugars regularly  Please bring your log next time  I have increased your gabapentin dose to three times daily - please let us know if this starts to make you sleepy, dizzy, or weak  I have placed a referral for you to see physical therapy - you should get a call about this soon  Please follow up in 2 weeks to discuss your diabetes regimen and other concerns

## 2022-11-08 LAB — LIPID PANEL
Chol/HDL Ratio: 4.7 ratio — ABNORMAL HIGH (ref 0.0–4.4)
Cholesterol, Total: 206 mg/dL — ABNORMAL HIGH (ref 100–199)
HDL: 44 mg/dL (ref >39)
LDL Chol Calc (NIH): 126 mg/dL — ABNORMAL HIGH (ref 0–99)
Triglycerides: 201 mg/dL — ABNORMAL HIGH (ref 0–149)
VLDL Cholesterol Cal: 36 mg/dL (ref 5–40)

## 2022-11-08 LAB — BASIC METABOLIC PANEL
BUN/Creatinine Ratio: 11 — ABNORMAL LOW (ref 12–28)
BUN: 9 mg/dL (ref 8–27)
CO2: 24 mmol/L (ref 20–29)
Calcium: 9.2 mg/dL (ref 8.7–10.3)
Chloride: 100 mmol/L (ref 96–106)
Creatinine, Ser: 0.85 mg/dL (ref 0.57–1.00)
Glucose: 168 mg/dL — ABNORMAL HIGH (ref 70–99)
Potassium: 4 mmol/L (ref 3.5–5.2)
Sodium: 142 mmol/L (ref 134–144)
eGFR: 72 mL/min/{1.73_m2} (ref >59)

## 2022-11-10 ENCOUNTER — Ambulatory Visit: Payer: Self-pay | Admitting: *Deleted

## 2022-11-10 ENCOUNTER — Encounter: Payer: Self-pay | Admitting: *Deleted

## 2022-11-10 NOTE — Patient Outreach (Signed)
  Care Coordination   Initial Visit Note   11/10/2022 Name: [name] [name] MRN: [id number] DOB: [birth year]  [name] [name] is a 72 y.o. year old female who sees [name] Drafts, MD for primary care. I spoke with  [name] by phone today.  What matters to the patients health and wellness today?  Lost my son and grandson in the past 5 months. Need counseling and medication assistance.    Goals Addressed             This Visit's Progress    Need transportation assistance and grief counseling       Activities and task to complete in order to accomplish goals.   Call your insurance provider for more information about your Enhanced Benefits ( ) Complete SCAT application Bevelyn Ngo, BSW, will call to assist Expect phone call from [facility] to schedule your initial visits for medication and counseling appointment. Start / continue relaxed breathing 3 times daily Keep all upcoming appointment discussed today Continue with compliance of taking medication prescribed by Doctor          SDOH assessments and interventions completed:  Yes  SDOH Interventions Today    Flowsheet Row Most Recent Value  SDOH Interventions   Food Insecurity Interventions Intervention Not Indicated  Transportation Interventions SCAT (Specialized Community [employer]poration)  Depression Interventions/Treatment  Referral to Psychiatry, Medication        Care Coordination Interventions:  Yes, provided  Interventions Today    Flowsheet Row Most Recent Value  Chronic Disease   Chronic disease during today's visit Other  [grief/depression and anxiety]  General Interventions   General Interventions Discussed/Reviewed Community Resources  Mental Health Interventions   Mental Health Discussed/Reviewed Mental Health Discussed, Mental Health Reviewed, Coping Strategies, Anxiety, Depression, Grief and Loss  [Pt wants to be linked with a provider for mental health support with RX  and counseling. Pt does attend a grief support program at her church as well.]       Follow up plan: Follow up call scheduled for 8/11/27/22    Encounter Outcome:  Pt. Visit Completed

## 2022-11-10 NOTE — Patient Instructions (Signed)
Visit Information  Thank you for taking time to visit with me today. Please don't hesitate to contact me if I can be of assistance to you.   Following are the goals we discussed today:   Goals Addressed             This Visit's Progress    Need transportation assistance and grief counseling       Activities and task to complete in order to accomplish goals.   Call your insurance provider for more information about your Enhanced Benefits ( ) Complete SCAT application Bevelyn Ngo, BSW, will call to assist Expect phone call from [facility] to schedule your initial visits for medication and counseling appointment. Start / continue relaxed breathing 3 times daily Keep all upcoming appointment discussed today Continue with compliance of taking medication prescribed by Doctor          Our next appointment is by telephone on 11/27/22   Please call the care guide team at [phone] if you need to cancel or reschedule your appointment.   If you are experiencing a Mental Health or Behavioral Health Crisis or need someone to talk to, please call the Suicide and Crisis Lifeline: 988 call 911   The patient verbalized understanding of instructions, educational materials, and care plan provided today and DECLINED offer to receive copy of patient instructions, educational materials, and care plan.   Telephone follow up appointment with care management team member scheduled for:11/27/22  Reece Levy, MSW, LCSW Clinical Social Worker [facility] [phone]

## 2022-11-11 ENCOUNTER — Telehealth: Payer: Self-pay | Admitting: Family Medicine

## 2022-11-11 NOTE — Telephone Encounter (Signed)
Attempted to call pt -it was previously documented that she is not taking her Crestor.  Her lipid panel showed worsening of her cholesterol.  I would like for her to restart her Crestor 40 mg if she is not already taking it.  If she is taking it, I would like to discuss possibly starting Zetia with her.  Otherwise labs unremarkable

## 2022-11-12 ENCOUNTER — Telehealth: Payer: Self-pay | Admitting: *Deleted

## 2022-11-12 NOTE — Progress Notes (Signed)
  Care Coordination Note  11/12/2022 Name: [name] [name] MRN: [id number] DOB: 04-06-1951  [name] [name] is a 72 y.o. year old female who is a primary care patient of [name], MD and is actively engaged with the care management team. I reached out to [facility] by phone today to assist with scheduling a follow up visit with the RN Case Manager  Follow up plan: Unsuccessful telephone outreach attempt made. A HIPAA compliant phone message was left for the patient providing contact information and requesting a return call.   SIGNATURE

## 2022-11-13 ENCOUNTER — Telehealth: Payer: Self-pay

## 2022-11-13 ENCOUNTER — Encounter: Payer: Self-pay | Admitting: Occupational Therapy

## 2022-11-13 ENCOUNTER — Ambulatory Visit: Payer: Self-pay | Admitting: Occupational Therapy

## 2022-11-13 NOTE — Patient Outreach (Signed)
Aging Gracefully Program  11/13/2022  [name] [name] [birth year] [id number]   Received a message from Ignacia Palma ( OT) who stated she went to see patient and she was sick. Patient was recently exposed to COVID.  I placed call to patient to cancel home visit scheduled for tomorrow. I left a message.   Plan: will call back next week to reschedule.  [name] RN, BSN, Careers adviser for [employer] Mobile: [phone]

## 2022-11-17 ENCOUNTER — Telehealth: Payer: Self-pay

## 2022-11-17 NOTE — Patient Outreach (Signed)
  Care Coordination   11/17/2022 Name: [name] [name] MRN: [id number] DOB: [birth year]   Care Coordination Outreach Attempts:  SW placed an outbound call to patients cell to follow up on care coordination needs. SW was advised the patient was unavailable to speak. Rescheduled call to tomorrow afternoon.  Follow Up Plan:  Additional outreach attempts will be made to offer the patient care coordination information and services.   Encounter Outcome:  Pt. Request to Call Back   Care Coordination Interventions:  No, not indicated    Bevelyn Ngo, BSW, CDP Social Worker, Certified Dementia Practitioner [facility] Care Management  Care Coordination [phone]

## 2022-11-17 NOTE — Progress Notes (Signed)
  Care Coordination Note  11/17/2022 Name: Nylea Writer MRN: [id number] DOB: 1950-05-01  [name] [name] is a 72 y.o. year old female who is a primary care patient of [name], MD and is actively engaged with the care management team. I reached out to [facility] by phone today to assist with re-scheduling a follow up visit with the RN Case Manager  Follow up plan: Unsuccessful telephone outreach attempt made. A HIPAA compliant phone message was left for the patient providing contact information and requesting a return call.   [facility]  Care Coordination Care Guide  Direct Dial: [phone]

## 2022-11-18 ENCOUNTER — Telehealth: Payer: Self-pay

## 2022-11-18 ENCOUNTER — Ambulatory Visit: Payer: Self-pay

## 2022-11-18 NOTE — Patient Outreach (Signed)
Aging Gracefully Program  11/18/2022  [name] [name] [birth year] [id number]   Placed call to patient to reschedule home visit. No answer. Left a message requesting a call back.  [name] RN, BSN, Careers adviser for [employer] Mobile: [phone]

## 2022-11-18 NOTE — Patient Outreach (Signed)
  Care Coordination   Follow Up Visit Note   11/18/2022 Name: [name] [name] MRN: [id number] DOB: 1950-06-21  [name] [name] is a 72 y.o. year old female who sees [name] Drafts, MD for primary care. I spoke with  [name] [name] by phone today.  What matters to the patients health and wellness today?  To being feeling better from cough/congestion    Goals Addressed             This Visit's Progress    Care Coordination Activities       Care Coordination Interventions: Discussed the patient has a lot of congestion and is not feeling well. She is utilizing her inhaler as prescribed Instructed the patient to contact her primary care providers office if symptoms worsen or fail to improve - patient stated understanding Determined to the patient has yet to receive feedback from [facility] regarding status of SCAT application Collaboration with [name] [name] requesting feedback on patient application Advised the patient SW will follow up with her after receiving feedback from [name] [name]        SDOH assessments and interventions completed:  No     Care Coordination Interventions:  Yes, provided   Interventions Today    Flowsheet Row Most Recent Value  Chronic Disease   Chronic disease during today's visit Other  [Congestion, Barriers to Transportation]  General Interventions   General Interventions Discussed/Reviewed [employer], Communication with, General Interventions Reviewed, Doctor Visits  Doctor Visits Discussed/Reviewed Doctor Visits Discussed        Follow up plan:  SW will continue to follow.    Encounter Outcome:  Pt. Visit Completed   [name] [name], BSW, CDP Social Worker, Certified Dementia Practitioner [facility] Care Management  Care Coordination [phone]

## 2022-11-18 NOTE — Patient Instructions (Signed)
Visit Information  Thank you for taking time to visit with me today. Please don't hesitate to contact me if I can be of assistance to you.   Following are the goals we discussed today:  - Contact your primary care provider as needed - Remain engaged with SW regarding SCAT transportation    If you are experiencing a Mental Health or Behavioral Health Crisis or need someone to talk to, please call the Suicide and Crisis Lifeline: 988 go to [facility] [address] [place] [phone]) call 911  The patient verbalized understanding of instructions, educational materials, and care plan provided today and DECLINED offer to receive copy of patient instructions, educational materials, and care plan.   Bevelyn Ngo, BSW, CDP Social Worker, Certified Dementia Practitioner [facility] Care Management  Care Coordination [phone]

## 2022-11-20 NOTE — Progress Notes (Signed)
  Care Coordination Note  11/20/2022 Name: Tenaj Harnett MRN: [id number] DOB: 11-26-1950  Georgianne Montas is a 71 y.o. year old female who is a primary care patient of [name], MD and is actively engaged with the care management team. I reached out to [facility] by phone today to assist with re-scheduling a follow up visit with the RN Case Manager  Follow up plan: Unsuccessful telephone outreach attempt made.   [facility]  Care Coordination Care Guide  Direct Dial: [phone]

## 2022-11-24 NOTE — Progress Notes (Signed)
  Care Coordination Note  11/24/2022 Name: [name] MRN: [id number] DOB: 29-Jan-1951  [name] [name] is a 72 y.o. year old female who is a primary care patient of [name], MD and is actively engaged with the care management team. I reached out to [facility] by phone today to assist with re-scheduling a follow up visit with the RN Case Manager  Follow up plan: Unsuccessful telephone outreach attempt made. A HIPAA compliant phone message was left for the patient providing contact information and requesting a return call.   [facility]  Care Coordination Care Guide  Direct Dial: [phone]

## 2022-11-25 NOTE — Patient Outreach (Signed)
  Care Coordination   Documentation  Note   11/25/2022 Name: [name] [name] MRN: [id number] DOB: 1951-02-17  [name] [name] is a 72 y.o. year old female who sees [name], MD for primary care. I  collaborated with [employer] to determined patient has been approved to access SCAT.  What matters to the patients health and wellness today?  SW did not speak with the patient today.    Goals Addressed             This Visit's Progress    COMPLETED: Care Coordination Activities       Care Coordination Interventions: Confirmed with [facility] the patient is approved for services Attempted to contact the patient; call unsuccessful Performed chart review to note patient is scheduled to speak with LCSW colleague on 8/8 Collaboration with LCSW to request she update the patient on approval to ride SCAT. The patient should receive information in the mail from [facility] on how to access the services        SDOH assessments and interventions completed:  No     Care Coordination Interventions:  Yes, provided   Interventions Today    Flowsheet Row Most Recent Value  Chronic Disease   Chronic disease during today's visit Other  [Transportation Barriers]  General Interventions   General Interventions Discussed/Reviewed [employer], Special educational needs teacher with  [Spoke with [facility] [place] [facility] to confirm patient is approved for service]  Communication with Social Work  [LCSW to update patient on approval for SCAT during next scheduled call]        Follow up plan: No further intervention required. The patient will remain engaged with LCSW.    Encounter Outcome:  Pt. Visit Completed   Bevelyn Ngo, BSW, CDP Social Worker, Certified Dementia Practitioner [facility] Care Management  Care Coordination [phone]

## 2022-11-27 ENCOUNTER — Encounter: Payer: Self-pay | Admitting: *Deleted

## 2022-11-28 ENCOUNTER — Telehealth: Payer: Self-pay | Admitting: *Deleted

## 2022-11-28 NOTE — Patient Outreach (Signed)
  Care Coordination   11/28/2022 Name: [name] [name] MRN: [id number] DOB: 1950/10/10   Care Coordination Outreach Attempts:  An unsuccessful telephone outreach was attempted today to offer the patient information about available care coordination services.  Follow Up Plan:  Additional outreach attempts will be made to offer the patient care coordination information and services.   Encounter Outcome:  No Answer   Care Coordination Interventions:  No, not indicated    Reece Levy, MSW, LCSW Clinical Social Worker [facility] [phone]

## 2022-12-01 ENCOUNTER — Telehealth: Payer: Self-pay

## 2022-12-01 NOTE — Patient Outreach (Signed)
Aging Gracefully Program  12/01/2022  [name] [name] February 02, 1951 [id number]   Placed call to patient to reschedule missed AG appointment.  No answer.  I left a message requesting a call back.  [name] RN, BSN, Careers adviser for [employer] Mobile: [phone]

## 2022-12-03 ENCOUNTER — Ambulatory Visit: Payer: Self-pay | Admitting: *Deleted

## 2022-12-03 NOTE — Patient Outreach (Signed)
  Care Coordination   Follow Up Visit Note   12/03/2022 Name: Prem Moyeda MRN: [id number] DOB: 11/08/1950  [name] [name] is a 72 y.o. year old female who sees [name], MD for primary care. I spoke with  [name] by phone today.  What matters to the patients health and wellness today?  Got approved for SCAT- still need to connect with counseling agency    Goals Addressed             This Visit's Progress    Need transportation assistance and grief counseling       Activities and task to complete in order to accomplish goals.   Call your PCP if you continue to feel sick  Call your insurance provider for more information about your Enhanced Benefits ( ) Now that SCAT is approved, use that as needed- granddaughter to help with getting a UMO card Call [facility] to schedule your initial visits for medication and counseling appointment. Start / continue relaxed breathing 3 times daily Keep all upcoming appointment discussed today Continue with compliance of taking medication prescribed by Doctor       Quit smoking / using tobacco          SDOH assessments and interventions completed:  Yes     Care Coordination Interventions:  Yes, provided  Interventions Today    Flowsheet Row Most Recent Value  General Interventions   General Interventions Discussed/Reviewed Community Resources  [Pt reports SCAT approval letter arrived. Granddaughter to help with access and uploading UMO pass]  Mental Health Interventions   Mental Health Discussed/Reviewed Mental Health Discussed, Depression  [Pt to follow up with [facility] for next steps in scheduling her counseling support]       Follow up plan:  12/16/22    Encounter Outcome:  Pt. Visit Completed

## 2022-12-04 ENCOUNTER — Telehealth: Payer: Self-pay

## 2022-12-04 NOTE — Patient Outreach (Signed)
Aging Gracefully Program  12/04/2022  [name] [name] 1950/09/15 [id number]   Patient returned call and states that she has been really sick for 3 weeks. Reports that she will reschedule her home visit but does not know what date will work for her at this time.   PLAN: encouraged patient to call me back in the next few days so I can get her back on my scheduled. Patient agreed.   Marland Kitchenacs

## 2022-12-04 NOTE — Patient Outreach (Signed)
Aging Gracefully Program  12/04/2022  [name] [name] 05-09-1950 [id number]   Voicemail received from patient. I returned call and no answer. I left a voicemail offering to reschedule previously missed home visit due to illness.  I suggested 8/ 20/2024  at 11am.   Plan: Will await a call back from patient.  [name] RN, BSN, Careers adviser for [employer] Mobile: [phone]

## 2022-12-09 ENCOUNTER — Telehealth: Payer: Self-pay

## 2022-12-09 NOTE — Patient Outreach (Signed)
Aging Gracefully Program  12/09/2022  [name] [name] 10-27-1950 [id number]   Placed call to patient to patient to book previously missed appointment.  Home visit scheduled for 12/12/2022 at 1215pm.  [name] RN, BSN, CEN RN Case Manager for Aging [employer] Mobile: [phone]

## 2022-12-12 ENCOUNTER — Other Ambulatory Visit: Payer: Self-pay | Admitting: Occupational Therapy

## 2022-12-12 ENCOUNTER — Other Ambulatory Visit: Payer: Self-pay

## 2022-12-12 NOTE — Patient Outreach (Signed)
Aging Gracefully Program  RN Visit  12/12/2022  [name] [name] 11-21-1950 [id number]  Visit:  RN Visit Number: 2- Second Visit  RN TIME CALCULATION: Start TIme:  RN Start Time Calculation: 1115 End Time:  RN Stop Time Calculation: 1205 Total Minutes:  RN Time Calculation: 50  Readiness To Change Score:     Universal RN Interventions: Calendar Distribution: Yes Exercise Review: Yes Medications: Yes Medication Changes: No Mood: Yes Pain: Yes PCP Advocacy/Support: No Fall Prevention: Yes Incontinence: Yes Clinician View Of Client Situation: Arrived to find patient in the hospital bed.  Reporting she had forgotten appointment. Client View Of His/Her Situation: Patient reports that she almost feel yesterday due to her legs.  Reports she was up and down all night with leg pain. Difficulty sleeping. Denies any changes to medications. Reports she continues touse a walker.  Healthcare Provider Communication: Did Surveyor, mining With [employer] Provider?: No According to Client, Did PCP Report Communication With An Aging Gracefully RN?: No  Clinician View of Client Situation: Clinician View Of Client Situation: Arrived to find patient in the hospital bed.  Reporting she had forgotten appointment. Client's View of His/Her Situation: Client View Of His/Her Situation: Patient reports that she almost feel yesterday due to her legs.  Reports she was up and down all night with leg pain. Difficulty sleeping. Denies any changes to medications. Reports she continues touse a walker.  Medication Assessment: denies any changes to medications    OT Update: Reviewed with patient that she has spoken with Baldo Ash this week and is waiting for a call back. Reviewed with patient that she has a schedule OT visit today.   Session Summary: pending contract signatures   Goals Addressed               This Visit's Progress     AG RN (pt-stated)        Goals:  Patient will report decrease  in pain in the next 160 days.   062/25/2024 Assessment:   Patient with chronic pain in her back due to injury.  Patient with limited ability to move about in her home due to inability to climb steps.  (Hand railing only on one side of the staircase)  Patient reports she takes her medication for pain ( gabapentin) and uses a heating pad. Interventions:  Reviewed use of ice and or heat.  Reviewed pain control. Reviewed ability to do exercise. Provided a [facility] calendar and reviewed the tool. Wrote all pending appointments in calendar. Encouraged patient to call MD for an appointment ( way past due). Encouraged patient to get prescriptions filled. Reviewed with patient to call the [facility] Customer service number to get assistance with a new CBG meter.  Plan: next home visit planned for 11/11/2022  CLIENT/RN ACTION PLAN - PAIN  Registered Nurse:  [name] RN   Date:    10/14/2022  Client Name:  [name] [name] Client ID:    Target Area:  PAIN   Why Problem May Occur: Previous back injury   Target Goal:  Patient will report decrease in back pain  in the next 160 days.   STRATEGIES Coping Strategies Ideas:  Heat  Reviewed 10/14/2022 Use heating pad or warm towel on painful area no more than 20 minutes at a time. Don't sleep with a heating pad on - it could burn your skin   Ice  Reviewed 10/14/2022 Use ice pack or frozen bag of vegetables on painful area.   Leave cold pack on  for less than 20 minutes. Ice can burn your skin.  Don't leave ice on longer than 20 minutes.   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 Turn off the TV and turn on the radio.   Prayer/Meditation Prayer and meditation can decrease pain   Other    Acetaminophen/Tylenol (same medication) DO NOT take more Acetaminophen than below because it can be bad for your liver. 500 mg. tablets:  2 tablets every 8 hours, as needed for pain.  Do not take more than 6, (500 mg) tablets every day. 300 mg. tablets:  2 tablets every 6 hours, as needed for pain.  Do not take more than 8 (325 mg) tablets every day. Look for Acetaminophen/Tylenol in other medicines you buy over the counter.   Still in Pain There ae a lot of different kinds of pain medicines:  creams, patches and supplements. Ask your Healthcare Provider about other pain medications.   Stop Smoking Smoking can make arthritis worse.   Stop Pain Reviewed 10/14/2022 Before it gets bad. Once in pain It is harder to get rid of. Begin pain relief while you have mild pain.   Other   Other    ;  PRACTICE It is important to practice the strategies so we can determine if they will be effective in helping to reach the goal.    Follow these specific recommendations:        If strategy does not work the first time, try it again.  We may make some changes over the next few sessions.    We may make some changes over the next few sessions, based on how they work.   [name] RN, BSN, CEN RN Case Production designer, theatre/television/film for [employer] [facility] Mobile: [phone]   12/12/2022 Assessment:  patient complaining of pain all over. Reports she was having trouble sleeping last night due to pain.  9/10 today. Reports taking all her medications as prescribed. States she has been trying to walk up the stairs with the new hand rail but has not made it up the stairs yet.  Reports CBG today of 180.   Interventions: reviewed pain and what patient is doing for pain. Reviewed and provided written home exercise plan. Demonstrated exercises. Patient not able to perform exercises due to pain as reported.  Encouraged patient to do her exercises daily.  Reviewed with patient that OT has an appointment  scheduled for later today. Also reviewed with the patient that [facility] care guide has been trying to schedule appointments for RN CM. I scheduled appointment with RNCM Juanell Fairly for 12/16/2022. Patient voiced understanding.   Plan: next home visit planned for 01/13/2023  [name] RN, BSN, CEN RN Case Manager for [employer] [facility] Mobile: [phone]        [name] RN, BSN, Careers adviser for [employer] Mobile: [phone]

## 2022-12-12 NOTE — Patient Instructions (Signed)
Visit Information  Thank you for taking time to visit with me today. Please don't hesitate to contact me if I can be of assistance to you before our next scheduled home appointment.  Following are the goals we discussed today:   Goals Addressed               This Visit's Progress     AG RN (pt-stated)        Goals:  Patient will report decrease in pain in the next 160 days.   062/25/2024 Assessment:   Patient with chronic pain in her back due to injury.  Patient with limited ability to move about in her home due to inability to climb steps.  (Hand railing only on one side of the staircase)  Patient reports she takes her medication for pain ( gabapentin) and uses a heating pad. Interventions:  Reviewed use of ice and or heat.  Reviewed pain control. Reviewed ability to do exercise. Provided a [facility] calendar and reviewed the tool. Wrote all pending appointments in calendar. Encouraged patient to call MD for an appointment ( way past due). Encouraged patient to get prescriptions filled. Reviewed with patient to call the [facility] Customer service number to get assistance with a new CBG meter.  Plan: next home visit planned for 11/11/2022  CLIENT/RN ACTION PLAN - PAIN  Registered Nurse:  [name] RN   Date:    10/14/2022  Client Name:  [name] Client ID:    Target Area:  PAIN   Why Problem May Occur: Previous back injury   Target Goal:  Patient will report decrease in back pain  in the next 160 days.   STRATEGIES Coping Strategies Ideas:  Heat  Reviewed 10/14/2022 Use heating pad or warm towel on painful area no more than 20 minutes at a time. Don't sleep with a heating pad on - it could burn your skin   Ice  Reviewed 10/14/2022 Use ice pack or frozen bag of vegetables on painful area.   Leave cold pack on for less than 20 minutes. Ice can burn your skin.  Don't leave ice on longer than 20 minutes.   Activity and Exercise  Will plan to review at next home visit on  11/11/2022 Joints get stiff when not in use Aging Gracefully Exercises Walking (inside or outside) [employer]:  cooking, cleaning, Building surveyor   Listen to Music Listening to music can decrease pain. Turn off the TV and turn on the radio.   Prayer/Meditation Prayer and meditation can decrease pain   Other   Acetaminophen/Tylenol (same medication) DO NOT take more Acetaminophen than below because it can be bad for your liver. 500 mg. tablets:  2 tablets every 8 hours, as needed for pain.  Do not take more than 6, (500 mg) tablets every day. 300 mg. tablets:  2 tablets every 6 hours, as needed for pain.  Do not take more than 8 (325 mg) tablets every day. Look for Acetaminophen/Tylenol in other medicines you buy over the counter.   Still in Pain There ae a lot of different kinds of pain medicines:  creams, patches and supplements. Ask your Healthcare Provider about other pain medications.   Stop Smoking Smoking can make arthritis worse.   Stop Pain Reviewed 10/14/2022 Before it gets bad. Once in pain It is harder to get rid of. Begin pain relief while you have mild pain.   Other   Other    ;  PRACTICE It  is important to practice the strategies so we can determine if they will be effective in helping to reach the goal.    Follow these specific recommendations:        If strategy does not work the first time, try it again.  We may make some changes over the next few sessions.    We may make some changes over the next few sessions, based on how they work.   [name] RN, BSN, CEN RN Case Production designer, theatre/television/film for [employer] [facility] Mobile: [phone]   12/12/2022 Assessment:  patient complaining of pain all over. Reports she was having trouble sleeping last night due to pain.  9/10 today. Reports taking all her medications as prescribed. States she has been trying to walk up the stairs with the new hand rail but has not made it up the  stairs yet.  Reports CBG today of 180.   Interventions: reviewed pain and what patient is doing for pain. Reviewed and provided written home exercise plan. Demonstrated exercises. Patient not able to perform exercises due to pain as reported.  Encouraged patient to do her exercises daily.  Reviewed with patient that OT has an appointment scheduled for later today. Also reviewed with the patient that [facility] care guide has been trying to schedule appointments for RN CM. I scheduled appointment with RNCM Juanell Fairly for 12/16/2022. Patient voiced understanding.   Plan: next home visit planned for 01/13/2023  [name] RN, BSN, CEN RN Case Manager for Aging Gracefully [facility] Mobile: [phone]          Our next appointment is on 01/13/2023   If you are experiencing a Mental Health or Behavioral Health Crisis or need someone to talk to, please call the Suicide and Crisis Lifeline: 988 call the [place] National Suicide Prevention Lifeline: 912-817-8553 or TTY: (415)359-6883 TTY 401 455 4872) to talk to a trained counselor call 1-800-273-TALK (toll free, 24 hour hotline) go to [facility] [address] [place] [phone]) call 911   The patient verbalized understanding of instructions, educational materials, and care plan provided today and agreed to receive a mailed copy of patient instructions, educational materials, and care plan.   [name] RN, BSN, Careers adviser for [employer] Mobile: [phone]

## 2022-12-14 NOTE — Patient Outreach (Signed)
Aging Gracefully Program  OT Follow-Up Visit  12/14/2022  [name] [name] November 28, 1950 [id number]  Visit:  2- Second Visit  Start Time:  1500 End Time:  1545 Total Minutes:  45  Readiness to Change Score :  Readiness to Change Score: 8  Durable Medical Equipment: Durable Medical Equipment:  (rollator)   Goals:   Goals Addressed             This Visit's Progress    Patient Stated       She would like to feel more safe getting around in her house and when she is out and about (will look into the possibility of a rollator). 12/12/2022 In process--a rollator will work for her--need to order her one.        Post Clinical Reasoning: Client Action (Goal) Four Interventions: Client tried out a rollator Did Client Try?: Yes Targeted Problem Area Status: A Lot Better Clinician View Of Client Situation:: [name] needs some more instruction on the use of a rollator, but she definitely feels safer and looks safer using it than a cane. She is having alot pain in her left side and reports one fall since I last saw her. She has not gotten in touch with her primary MD to get HHPT set up so I will reach for her. She said she tried on her own to get up the steps and only made it half way, Client View Of His/Her Situation:: She really wants to be able to get upstairs to take a shower/bath. She is frustrated with her pain issues impeeding this. Next Visit Plan:: rollator  [place], [place] Aging Gracefully [phone]

## 2022-12-15 ENCOUNTER — Other Ambulatory Visit: Payer: Self-pay | Admitting: Family Medicine

## 2022-12-15 DIAGNOSIS — M5417 Radiculopathy, lumbosacral region: Secondary | ICD-10-CM

## 2022-12-16 ENCOUNTER — Ambulatory Visit: Payer: Self-pay

## 2022-12-16 ENCOUNTER — Encounter: Payer: Self-pay | Admitting: *Deleted

## 2022-12-16 NOTE — Patient Outreach (Signed)
  Care Coordination   Follow Up Visit Note   12/19/2022 Name: [name] [name] MRN: [id number] DOB: [birth year]  [name] [name] is a 72 y.o. year old female who sees [name], MD for primary care. I spoke with  [name] by phone today.  What matters to the patients health and wellness today?  During our discussion, [name] [name] indicated that she conducts daily blood sugar checks at varying times, with readings typically falling within the range of 170 to 200. Her A1C level was measured at 8.9 on 11/07/22, and her most recent blood pressure reading in the office on the same date was 170/90, as she lacks a home monitoring device. A request has been made to Lonia Chimera to see if her program can furnish [name] [name] with a blood pressure monitor. [name] [name] mentioned an upcoming follow-up appointment with Dr. Apolinar Junes scheduled for 12/24/22. Furthermore, arrangements have been made to provide her with coupons for assistance.       Goals Addressed               This Visit's Progress     Diabetes and Hypertension are Patient stated goal to manage (pt-stated)        Patient Goals/Self Care Activities: -Patient/Caregiver will take medications as prescribed   -Patient/Caregiver will attend all scheduled provider appointments -Patient/Caregiver will call pharmacy for medication refills 3-7 days in advance of running out of medications -Patient/Caregiver will call provider office for new concerns or questions  -Patient/Caregiver will focus on medication adherence by taking medications as prescribed  -check blood sugar at prescribed times -record values and write them down take them to all doctor visits    - Check blood pressure when a new BP monitor is obtained.         SDOH assessments and interventions completed:  Yes  SDOH Interventions Today    Flowsheet Row Most Recent Value  SDOH Interventions   Food Insecurity Interventions Intervention Not Indicated  Transportation  Interventions Intervention Not Indicated        Care Coordination Interventions:  Yes, provided  Interventions Today    Flowsheet Row Most Recent Value  Chronic Disease   Chronic disease during today's visit Diabetes, Hypertension (HTN)  General Interventions   General Interventions Discussed/Reviewed General Interventions Discussed, General Interventions Reviewed  Education Interventions   Education Provided Provided Education  Nutrition Interventions   Nutrition Discussed/Reviewed Nutrition Discussed, Supplemental nutrition  Pharmacy Interventions   Pharmacy Dicussed/Reviewed Pharmacy Topics Discussed  Safety Interventions   Safety Discussed/Reviewed Safety Reviewed        Follow up plan: Follow up call scheduled for 12/30/22  10 am    Encounter Outcome:  Pt. Visit Completed   Juanell Fairly RN, BSN, [facility]   Care Coordinator Phone: [phone]

## 2022-12-17 ENCOUNTER — Telehealth: Payer: Self-pay | Admitting: *Deleted

## 2022-12-17 NOTE — Patient Outreach (Signed)
  Care Coordination   12/17/2022 Name: [name] [name] MRN: [id number] DOB: 04-03-1951   Care Coordination Outreach Attempts:  An unsuccessful telephone outreach was attempted today to offer the patient information about available care coordination services.  Follow Up Plan:  Additional outreach attempts will be made to offer the patient care coordination information and services.   Encounter Outcome:  No Answer   Care Coordination Interventions:  No, not indicated    Reece Levy, MSW, LCSW Clinical Social Worker [phone]

## 2022-12-19 NOTE — Patient Outreach (Deleted)
  Care Coordination   Initial Visit Note   12/16/2022 Name: [name] [name] MRN: [id number] DOB: [birth year]  [name] [name] is a 72 y.o. year old female who sees [name], MD for primary care. I spoke with  [name] by phone today.  What matters to the patients health and wellness today?  During our discussion, Mrs. [name] [name] indicated that she conducts daily blood sugar checks at varying times, with readings typically falling within the range of 170 to 200. Her A1C level was measured at 8.9 on 11/07/22, and her most recent blood pressure reading in the office on the same date was 170/90, as she lacks a home monitoring device. A request has been made to L[facility] Ch[facility] to see if her program can furnish Mrs. [name] [name] with a blood pressure monitor. Mrs. [name] [name] mentioned an upcoming follow-up appointment with Dr. Apolinar Junes scheduled for 12/24/22. Furthermore, arrangements have been made to provide her with coupons for assistance.    Goals Addressed               This Visit's Progress     Diabetes and Hypertension are Patient stated goal to manage (pt-stated)        Patient Goals/Self Care Activities: -Patient/Caregiver will take medications as prescribed   -Patient/Caregiver will attend all scheduled provider appointments -Patient/Caregiver will call pharmacy for medication refills 3-7 days in advance of running out of medications -Patient/Caregiver will call provider office for new concerns or questions  -Patient/Caregiver will focus on medication adherence by taking medications as prescribed  -check blood sugar at prescribed times -record values and write them down take them to all doctor visits    - Check blood pressure when a new BP monitor is obtained.         SDOH assessments and interventions completed:  Yes{THN Tip this will not be part of the note when signed-REQUIRED REPORT FIELD DO NOT DELETE (Optional):27901}  SDOH Interventions Today    Flowsheet Row Most Recent Value   SDOH Interventions   Food Insecurity Interventions Intervention Not Indicated  Transportation Interventions Intervention Not Indicated        Care Coordination Interventions:  Yes, provided {THN Tip this will not be part of the note when signed-REQUIRED REPORT FIELD DO NOT DELETE (Optional):27901}  Interventions Today    Flowsheet Row Most Recent Value  Chronic Disease   Chronic disease during today's visit Diabetes, Hypertension (HTN)  General Interventions   General Interventions Discussed/Reviewed General Interventions Discussed, General Interventions Reviewed  Education Interventions   Education Provided Provided Education  Nutrition Interventions   Nutrition Discussed/Reviewed Nutrition Discussed, Supplemental nutrition  Pharmacy Interventions   Pharmacy Dicussed/Reviewed Pharmacy Topics Discussed  Safety Interventions   Safety Discussed/Reviewed Safety Reviewed        Follow up plan: Follow up call scheduled for 12/30/22  10 am    Encounter Outcome:  Pt. Visit Completed {THN Tip this will not be part of the note when signed-REQUIRED REPORT FIELD DO NOT DELETE (Optional):27901}  Juanell Fairly RN, BSN, [facility] [employer] Phone: [phone]

## 2022-12-19 NOTE — Patient Instructions (Addendum)
Visit Information  Thank you for taking time to visit with me today. Please don't hesitate to contact me if I can be of assistance to you.   Following are the goals we discussed today:   Goals Addressed               This Visit's Progress     Diabetes and Hypertension are Patient stated goal to manage (pt-stated)        Patient Goals/Self Care Activities: -Patient/Caregiver will take medications as prescribed   -Patient/Caregiver will attend all scheduled provider appointments -Patient/Caregiver will call pharmacy for medication refills 3-7 days in advance of running out of medications -Patient/Caregiver will call provider office for new concerns or questions  -Patient/Caregiver will focus on medication adherence by taking medications as prescribed  -check blood sugar at prescribed times -record values and write them down take them to all doctor visits    - Check blood pressure when a new BP monitor is obtained.         Our next appointment is by telephone on 12/30/22 at 10 am  Please call the care guide team at [phone] if you need to cancel or reschedule your appointment.   If you are experiencing a Mental Health or Behavioral Health Crisis or need someone to talk to, please call 1-800-273-TALK (toll free, 24 hour hotline)  The patient verbalized understanding of instructions, educational materials, and care plan provided today.      Juanell Fairly RN, BSN, [facility] [employer] Phone: [phone]

## 2022-12-24 ENCOUNTER — Ambulatory Visit: Payer: Medicare HMO | Admitting: Family Medicine

## 2022-12-30 ENCOUNTER — Telehealth: Payer: Self-pay

## 2022-12-30 NOTE — Patient Outreach (Signed)
  Care Coordination   12/30/2022 Name: [name] [name] MRN: [id number] DOB: 18-Jun-1950   Care Coordination Outreach Attempts:  An unsuccessful telephone outreach was attempted today to offer the patient information about available care coordination services.  Follow Up Plan:  Additional outreach attempts will be made to offer the patient care coordination information and services.   Encounter Outcome:  No Answer   Care Coordination Interventions:  No, not indicated    Juanell Fairly RN, BSN, [facility] [employer]design[employer] Phone: [phone]

## 2023-01-02 ENCOUNTER — Telehealth: Payer: Self-pay

## 2023-01-02 ENCOUNTER — Telehealth: Payer: Self-pay | Admitting: *Deleted

## 2023-01-02 NOTE — Telephone Encounter (Signed)
Received call from Reina Fuse, OT at [facility] with [facility] (through [facility]).   She is requesting update on home health referral for PT.   She states that she can either be called at [phone] or receive secure message Reina Fuse).   Will forward to Jessica/Shari for update.   Thanks.   Veronda Prude, RN

## 2023-01-02 NOTE — Progress Notes (Signed)
Care Coordination Note  01/02/2023 Name: Sereniti Houchens MRN: [id number] DOB: 17-Dec-1950  [name] [name] is a 72 y.o. year old female who is a primary care patient of [name], MD and is actively engaged with the care management team. I reached out to [facility] by phone today to assist with re-scheduling a follow up visit with the RN Case Manager  Follow up plan: Telephone appointment with care management team member scheduled for:01/28/23  Patient would like to follow up on when Rollator will be deliverd gave contact information for Occupational Therapist : Lynden Ang, [place] [facility] [phone]  [facility]  Care Coordination Care Guide  Direct Dial: [phone]

## 2023-01-02 NOTE — Progress Notes (Signed)
Care Coordination Note  01/02/2023 Name: [name] [name] MRN: [id number] DOB: 09-29-1950  [name] [name] is a 72 y.o. year old female who is a primary care patient of [name], MD and is actively engaged with the care management team. I reached out to [facility] by phone today to assist with re-scheduling a follow up visit with the RN Case Manager and Licensed Clinical Social Worker  Follow up plan: Unsuccessful telephone outreach attempt made. A HIPAA compliant phone message was left for the patient providing contact information and requesting a return call.   [facility]  Care Coordination Care Guide  Direct Dial: [phone]

## 2023-01-05 ENCOUNTER — Ambulatory Visit (INDEPENDENT_AMBULATORY_CARE_PROVIDER_SITE_OTHER): Payer: Medicare HMO | Admitting: Family Medicine

## 2023-01-05 ENCOUNTER — Encounter: Payer: Self-pay | Admitting: Family Medicine

## 2023-01-05 VITALS — BP 187/111 | HR 81 | Ht 63.0 in | Wt 194.2 lb

## 2023-01-05 DIAGNOSIS — E1159 Type 2 diabetes mellitus with other circulatory complications: Secondary | ICD-10-CM | POA: Diagnosis not present

## 2023-01-05 DIAGNOSIS — Z20822 Contact with and (suspected) exposure to covid-19: Secondary | ICD-10-CM | POA: Diagnosis not present

## 2023-01-05 DIAGNOSIS — I152 Hypertension secondary to endocrine disorders: Secondary | ICD-10-CM | POA: Diagnosis not present

## 2023-01-05 DIAGNOSIS — J029 Acute pharyngitis, unspecified: Secondary | ICD-10-CM | POA: Diagnosis not present

## 2023-01-05 DIAGNOSIS — E1142 Type 2 diabetes mellitus with diabetic polyneuropathy: Secondary | ICD-10-CM | POA: Diagnosis not present

## 2023-01-05 MED ORDER — LANTUS SOLOSTAR 100 UNIT/ML ~~LOC~~ SOPN
18.0000 [IU] | PEN_INJECTOR | Freq: Every day | SUBCUTANEOUS | Status: AC
Start: 2023-01-05 — End: ?

## 2023-01-05 MED ORDER — SEMAGLUTIDE(0.25 OR 0.5MG/DOS) 2 MG/1.5ML ~~LOC~~ SOPN
0.2500 mg | PEN_INJECTOR | SUBCUTANEOUS | 0 refills | Status: DC
Start: 2023-01-05 — End: 2023-02-23

## 2023-01-05 MED ORDER — AMLODIPINE BESYLATE 10 MG PO TABS
ORAL_TABLET | ORAL | 9 refills | Status: DC
Start: 2023-01-05 — End: 2023-05-12

## 2023-01-05 MED ORDER — ACCU-CHEK GUIDE W/DEVICE KIT
1.0000 | PACK | Freq: Four times a day (QID) | 1 refills | Status: AC
Start: 2023-01-05 — End: ?

## 2023-01-05 NOTE — Progress Notes (Signed)
    SUBJECTIVE:   CHIEF COMPLAINT / HPI:   T2DM: -Current medication regimen: Lantus 14 units daily (increased from 12u at last visit) -Home CBGs: Ranging from 170-225 fasting -Endorses some polyuria but currently denies abdominal pain, chest pain, shortness of breath  Lab Results  Component Value Date   HGBA1C 8.9 (A) 11/07/2022   HGBA1C 9.4 (A) 05/30/2022   HGBA1C 10.1 (A) 01/16/2022    HTN: Blood pressure elevated today Current listed medications include Norvasc 10 mg daily, Coreg 25 mg twice daily, losartan 25 mg daily.  However, patient reports that she is only taking Coreg and is not taking Norvasc or losartan. Unable to check home BP but states that someone is helping her get a BP cuff soon Endorses some chronic headaches but no new severe headache, vision changes, shortness of breath, abdominal pain  Patient reports that her sister had COVID and she has been exposed to her, is requesting a COVID test today.  Denies new symptoms but endorses some chronic congestion, sore throat  PERTINENT  PMH / PSH: Hypertension, diabetes, CAD, COPD, HLD  OBJECTIVE:   BP (!) 187/111   Pulse 81   Ht 5\' 3"  (1.6 m)   Wt 194 lb 4 oz (88.1 kg)   SpO2 100%   BMI 34.41 kg/m    General: NAD, pleasant, able to participate in exam HEENT: EOMI, PERRLA Cardiac: RRR, no murmurs auscultated Respiratory: CTAB, normal WOB Abdomen: soft, non-tender, non-distended, normoactive bowel sounds Extremities: warm and well perfused, no edema or cyanosis Skin: warm and dry, no rashes noted Neuro: alert, no obvious focal deficits, speech normal Psych: Normal affect and mood  ASSESSMENT/PLAN:   Assessment & Plan DM type 2 with diabetic peripheral neuropathy (HCC) Home CBGs above goal.  Increase Lantus to 18 units over the next couple of days, follow-up in 2 weeks.  Also start Ozempic 0.25 mg weekly, patient agreeable to this.  Continue to titrate Ozempic as appropriate.  Next A1c in October.  Consider  starting SGLT2 as her A1c improves.  Check UACR today. Hypertension associated with diabetes (HCC) BP elevated today although patient reports that she has not been taking Norvasc or losartan.  Will restart amlodipine today and follow-up in 2 weeks.  If elevated at that time consider restarting losartan Exposure to COVID-19 virus Check COVID test today per patient request, reassuringly no new symptoms.     [name], MD [facility]

## 2023-01-05 NOTE — Assessment & Plan Note (Signed)
Home CBGs above goal.  Increase Lantus to 18 units over the next couple of days, follow-up in 2 weeks.  Also start Ozempic 0.25 mg weekly, patient agreeable to this.  Continue to titrate Ozempic as appropriate.  Next A1c in October.  Consider starting SGLT2 as her A1c improves.  Check UACR today.

## 2023-01-05 NOTE — Patient Instructions (Addendum)
Increase Lantus to 14 units tomorrow 9/17, then increase to 18 units on 9/18 assuming your blood sugars are not lower than 120  Start ozempic weekly   Restart your amlodipine for blood pressure  I will let you know if your covid test is positive

## 2023-01-05 NOTE — Assessment & Plan Note (Signed)
BP elevated today although patient reports that she has not been taking Norvasc or losartan.  Will restart amlodipine today and follow-up in 2 weeks.  If elevated at that time consider restarting losartan

## 2023-01-06 ENCOUNTER — Encounter: Payer: Self-pay | Admitting: Family Medicine

## 2023-01-06 ENCOUNTER — Ambulatory Visit: Payer: Self-pay | Admitting: *Deleted

## 2023-01-06 NOTE — Patient Outreach (Signed)
Care Coordination   Follow Up Visit Note   01/06/2023 Name: [name] [name] MRN: [id number] DOB: [birth year]  [name] [name] is a 72 y.o. year old female who sees [name] Drafts, MD for primary care. I spoke with  [name] [name] by phone today.  What matters to the patients health and wellness today?  Not feeling well today (tooth hurting),    Goals Addressed             This Visit's Progress    Need transportation assistance and grief counseling       Activities and task to complete in order to accomplish goals.   Continue to use [employer] now that it is approved, use that as needed- granddaughter to help with getting a UMO card Call [facility] to schedule your initial visits for medication and counseling appointment. Start / continue relaxed breathing 3 times daily Keep all upcoming appointment discussed today Continue with compliance of taking medication prescribed by Doctor Inquire with DSS about medicaid eligiblilty and increasing food stamps allotment          SDOH assessments and interventions completed:  Yes     Care Coordination Interventions:  Yes, provided  Interventions Today    Flowsheet Row Most Recent Value  General Interventions   General Interventions Discussed/Reviewed Community Resources  [SCAT approved and used to get to appointment yesterday. reminded her about the UMO card which will reduce fare for each ride]  Mental Health Interventions   Mental Health Discussed/Reviewed Mental Health Discussed, Depression, Anxiety, Mental Health Reviewed, Coping Strategies       Follow up plan: Follow up call scheduled for 01/27/23    Encounter Outcome:  Patient Visit Completed

## 2023-01-06 NOTE — Patient Instructions (Signed)
Visit Information  Thank you for taking time to visit with me today. Please don't hesitate to contact me if I can be of assistance to you.   Following are the goals we discussed today:   Goals Addressed             This Visit's Progress    Need transportation assistance and grief counseling       Activities and task to complete in order to accomplish goals.   Continue to use [employer] now that it is approved, use that as needed- granddaughter to help with getting a UMO card Call [facility] to schedule your initial visits for medication and counseling appointment. Start / continue relaxed breathing 3 times daily Keep all upcoming appointment discussed today Continue with compliance of taking medication prescribed by Doctor Inquire with DSS about medicaid eligiblilty and increasing food stamps allotment          Our next appointment is by telephone on 01/27/23  Please call the care guide team at [phone] if you need to cancel or reschedule your appointment.   If you are experiencing a Mental Health or Behavioral Health Crisis or need someone to talk to, please call the Suicide and Crisis Lifeline: 988 call 911   The patient verbalized understanding of instructions, educational materials, and care plan provided today and DECLINED offer to receive copy of patient instructions, educational materials, and care plan.   Telephone follow up appointment with care management team member scheduled for:01/27/23  Reece Levy, MSW, LCSW Clinical Social Worker [phone]

## 2023-01-07 ENCOUNTER — Encounter: Payer: Self-pay | Admitting: Family Medicine

## 2023-01-07 DIAGNOSIS — M5417 Radiculopathy, lumbosacral region: Secondary | ICD-10-CM | POA: Diagnosis not present

## 2023-01-07 DIAGNOSIS — I5030 Unspecified diastolic (congestive) heart failure: Secondary | ICD-10-CM | POA: Diagnosis not present

## 2023-01-07 DIAGNOSIS — E1169 Type 2 diabetes mellitus with other specified complication: Secondary | ICD-10-CM | POA: Diagnosis not present

## 2023-01-07 DIAGNOSIS — I152 Hypertension secondary to endocrine disorders: Secondary | ICD-10-CM | POA: Diagnosis not present

## 2023-01-07 DIAGNOSIS — M4807 Spinal stenosis, lumbosacral region: Secondary | ICD-10-CM | POA: Diagnosis not present

## 2023-01-07 DIAGNOSIS — I2510 Atherosclerotic heart disease of native coronary artery without angina pectoris: Secondary | ICD-10-CM | POA: Diagnosis not present

## 2023-01-07 DIAGNOSIS — E1142 Type 2 diabetes mellitus with diabetic polyneuropathy: Secondary | ICD-10-CM | POA: Diagnosis not present

## 2023-01-07 DIAGNOSIS — E1159 Type 2 diabetes mellitus with other circulatory complications: Secondary | ICD-10-CM | POA: Diagnosis not present

## 2023-01-07 DIAGNOSIS — J4489 Other specified chronic obstructive pulmonary disease: Secondary | ICD-10-CM | POA: Diagnosis not present

## 2023-01-07 LAB — MICROALBUMIN / CREATININE URINE RATIO
Creatinine, Urine: 64.6 mg/dL
Microalb/Creat Ratio: 26 mg/g{creat} (ref 0–29)
Microalbumin, Urine: 16.9 ug/mL

## 2023-01-07 LAB — COVID-19, FLU A+B NAA
Influenza A, NAA: NOT DETECTED
Influenza B, NAA: NOT DETECTED
SARS-CoV-2, NAA: NOT DETECTED

## 2023-01-12 DIAGNOSIS — M5417 Radiculopathy, lumbosacral region: Secondary | ICD-10-CM | POA: Diagnosis not present

## 2023-01-12 DIAGNOSIS — J4489 Other specified chronic obstructive pulmonary disease: Secondary | ICD-10-CM | POA: Diagnosis not present

## 2023-01-12 DIAGNOSIS — E1159 Type 2 diabetes mellitus with other circulatory complications: Secondary | ICD-10-CM | POA: Diagnosis not present

## 2023-01-12 DIAGNOSIS — M4807 Spinal stenosis, lumbosacral region: Secondary | ICD-10-CM | POA: Diagnosis not present

## 2023-01-12 DIAGNOSIS — E1169 Type 2 diabetes mellitus with other specified complication: Secondary | ICD-10-CM | POA: Diagnosis not present

## 2023-01-12 DIAGNOSIS — E1142 Type 2 diabetes mellitus with diabetic polyneuropathy: Secondary | ICD-10-CM | POA: Diagnosis not present

## 2023-01-12 DIAGNOSIS — I2510 Atherosclerotic heart disease of native coronary artery without angina pectoris: Secondary | ICD-10-CM | POA: Diagnosis not present

## 2023-01-12 DIAGNOSIS — I5030 Unspecified diastolic (congestive) heart failure: Secondary | ICD-10-CM | POA: Diagnosis not present

## 2023-01-12 DIAGNOSIS — I152 Hypertension secondary to endocrine disorders: Secondary | ICD-10-CM | POA: Diagnosis not present

## 2023-01-13 ENCOUNTER — Other Ambulatory Visit: Payer: Self-pay

## 2023-01-13 NOTE — Patient Outreach (Signed)
[facility] Program  RN Visit  01/13/2023  [name] Nov 23, 1950 [id number]  Visit:  RN Visit Number: 3- Third Visit  RN TIME CALCULATION: Start TIme:  RN Start Time Calculation: 1053 End Time:  RN Stop Time Calculation: 1125 Total Minutes:  RN Time Calculation: 32  Readiness To Change Score:     Universal RN Interventions: Calendar Distribution: Yes Exercise Review: Yes Medications: Yes Medication Changes: No Mood: Yes Pain: Yes PCP Advocacy/Support: No Fall Prevention: Yes Incontinence: Yes Clinician View Of Client Situation: Arrived to home visit and rang the door bell muliple times.  Patient ambulatory to the door. Said she forgot appointment. Client View Of His/Her Situation: reports left leg pains, no falls, Went to MD last week and was put back on amlodipine.  Reports not checking CBG. recent increase in insulin.  Reports no needed help with meter.   reports DM nurse did a home visit yesterday from [facility].  Patient reports that [facility] is helping her with medication management.  Patient reports that she had PT last week and is coming tomorrow and Thursday.  Reports that she feels this is helpful. Reports doing her home exercises. Patient reports that she needs help with medicaid application but that she does not have the application yet.  Healthcare Provider Communication: Did Surveyor, mining With [employer] Provider?: No According to Client, Did PCP Report Communication With An Aging [facility]fully RN?: No  Clinician View of Client Situation: Clinician View Of Client Situation: Arrived to home visit and rang the door bell muliple times.  Patient ambulatory to the door. Said she forgot appointment. Client's View of His/Her Situation: Client View Of His/Her Situation: reports left leg pains, no falls, Went to MD last week and was put back on amlodipine.  Reports not checking CBG. recent increase in insulin.  Reports no needed help with meter.   reports DM  nurse did a home visit yesterday from [facility].  Patient reports that [facility] is helping her with medication management.  Patient reports that she had PT last week and is coming tomorrow and Thursday.  Reports that she feels this is helpful. Reports doing her home exercises. Patient reports that she needs help with medicaid application but that she does not have the application yet.  Medication Assessment: reviewed changes from PCP with patient and she voiced understanding.     OT Update: Pending home modifications  Session Summary: Patient appears to be doing about the same today/ Encouraged patient to follow up with recommendations from the health care team.    Goals Addressed               This Visit's Progress     AG RN (pt-stated)        Goals:  Patient will report decrease in pain in the next 160 days.   10/14/2022 Assessment:   Patient with chronic pain in her back due to injury.  Patient with limited ability to move about in her home due to inability to climb steps.  (Hand railing only on one side of the staircase)  Patient reports she takes her medication for pain ( gabapentin) and uses a heating pad. Interventions:  Reviewed use of ice and or heat.  Reviewed pain control. Reviewed ability to do exercise. Provided a [facility] calendar and reviewed the tool. Wrote all pending appointments in calendar. Encouraged patient to call MD for an appointment ( way past due). Encouraged patient to get prescriptions filled. Reviewed with patient to call  the [facility] Customer service number to get assistance with a new CBG meter.  Plan: next home visit planned for 11/11/2022  CLIENT/RN ACTION PLAN - PAIN  Registered Nurse:  [name] RN   Date:    10/14/2022  Client Name:  [name] [name] Client ID:    Target Area:  PAIN   Why Problem May Occur: Previous back injury   Target Goal:  Patient will report decrease in back pain  in the next 160 days.   STRATEGIES Coping Strategies Ideas:   Heat  Reviewed 10/14/2022 Use heating pad or warm towel on painful area no more than 20 minutes at a time. Don't sleep with a heating pad on - it could burn your skin   Ice  Reviewed 10/14/2022 Use ice pack or frozen bag of vegetables on painful area.   Leave cold pack on for less than 20 minutes. Ice can burn your skin.  Don't leave ice on longer than 20 minutes.   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 Turn off the TV and turn on the radio.   Prayer/Meditation Prayer and meditation can decrease pain   Other   Acetaminophen/Tylenol (same medication) DO NOT take more Acetaminophen than below because it can be bad for your liver. 500 mg. tablets:  2 tablets every 8 hours, as needed for pain.  Do not take more than 6, (500 mg) tablets every day. 300 mg. tablets:  2 tablets every 6 hours, as needed for pain.  Do not take more than 8 (325 mg) tablets every day. Look for Acetaminophen/Tylenol in other medicines you buy over the counter.   Still in Pain There ae a lot of different kinds of pain medicines:  creams, patches and supplements. Ask your Healthcare Provider about other pain medications.   Stop Smoking Smoking can make arthritis worse.   Stop Pain Reviewed 10/14/2022 Before it gets bad. Once in pain It is harder to get rid of. Begin pain relief while you have mild pain.   Other   Other    ;  PRACTICE It is important to practice the strategies so we can determine if they will be effective in helping to reach the goal.    Follow these specific recommendations:        If strategy does not work the first time, try it again.  We may make some changes over the next few sessions.    We may make some changes over the next  few sessions, based on how they work.   [name] RN, BSN, CEN RN Case Production designer, theatre/television/film for [employer] [facility] Mobile: [phone]   12/12/2022 Assessment:  patient complaining of pain all over. Reports she was having trouble sleeping last night due to pain.  9/10 today. Reports taking all her medications as prescribed. States she has been trying to walk up the stairs with the new hand rail but has not made it up the stairs yet.  Reports CBG today of 180.   Interventions: reviewed pain and what patient is doing for pain. Reviewed and provided written home exercise plan. Demonstrated exercises. Patient not able to perform exercises due to pain as reported.  Encouraged patient to do her exercises daily.  Reviewed with patient that OT has  an appointment scheduled for later today. Also reviewed with the patient that [facility] care guide has been trying to schedule appointments for RN CM. I scheduled appointment with RNCM Juanell Fairly for 12/16/2022. Patient voiced understanding.   Plan: next home visit planned for 01/13/2023  [name] RN, BSN, CEN RN Case Manager for [employer] [facility] Mobile: [phone]   01/13/2023 Assessment: Patient forgot appointment. Ambulating well to the bathroom. Patient reports being active with [facility] RN and PT. Reports she is doing her exercises.  Interventions:  Reviewed importance of home exercise plan and offered encouragement.  Encouraged patient to talk about her concerns with her assigned RNCM and Child psychotherapist. Reviewed with patient interventions provided by [facility] and Social worker and suggested patient complete recommendations. Offered to assist patient with new CBG meter and she declined my assistance. Reviewed pending appointments. Encouraged patient to follow MD recommendations for pain control.   Plan: Next home visit planned for 02/10/2023  Lonia Chimera, RN, BSN, CEN [facility] Community Care Coordinator [phone]         Lonia Chimera RN, BSN, CEN RN Case Production designer, theatre/television/film for Aging Chemical engineer Phone:  [phone]

## 2023-01-13 NOTE — Patient Instructions (Signed)
Visit Information  Thank you for taking time to visit with me today. Please don't hesitate to contact me if I can be of assistance to you before our next scheduled telephone appointment.  Following are the goals we discussed today:   Goals Addressed               This Visit's Progress     AG RN (pt-stated)        Goals:  Patient will report decrease in pain in the next 160 days.   10/14/2022 Assessment:   Patient with chronic pain in her back due to injury.  Patient with limited ability to move about in her home due to inability to climb steps.  (Hand railing only on one side of the staircase)  Patient reports she takes her medication for pain ( gabapentin) and uses a heating pad. Interventions:  Reviewed use of ice and or heat.  Reviewed pain control. Reviewed ability to do exercise. Provided a [facility] calendar and reviewed the tool. Wrote all pending appointments in calendar. Encouraged patient to call MD for an appointment ( way past due). Encouraged patient to get prescriptions filled. Reviewed with patient to call the [facility] Customer service number to get assistance with a new CBG meter.  Plan: next home visit planned for 11/11/2022  CLIENT/RN ACTION PLAN - PAIN  Registered Nurse:  [name] RN   Date:    10/14/2022  Client Name:  [name] Client ID:    Target Area:  PAIN   Why Problem May Occur: Previous back injury   Target Goal:  Patient will report decrease in back pain  in the next 160 days.   STRATEGIES Coping Strategies Ideas:  Heat  Reviewed 10/14/2022 Use heating pad or warm towel on painful area no more than 20 minutes at a time. Don't sleep with a heating pad on - it could burn your skin   Ice  Reviewed 10/14/2022 Use ice pack or frozen bag of vegetables on painful area.   Leave cold pack on for less than 20 minutes. Ice can burn your skin.  Don't leave ice on longer than 20 minutes.   Activity and Exercise  Will plan to review at next home visit on  11/11/2022 Joints get stiff when not in use Aging Gracefully Exercises Walking (inside or outside) [employer]:  cooking, cleaning, Building surveyor   Listen to Music Listening to music can decrease pain. Turn off the TV and turn on the radio.   Prayer/Meditation Prayer and meditation can decrease pain   Other   Acetaminophen/Tylenol (same medication) DO NOT take more Acetaminophen than below because it can be bad for your liver. 500 mg. tablets:  2 tablets every 8 hours, as needed for pain.  Do not take more than 6, (500 mg) tablets every day. 300 mg. tablets:  2 tablets every 6 hours, as needed for pain.  Do not take more than 8 (325 mg) tablets every day. Look for Acetaminophen/Tylenol in other medicines you buy over the counter.   Still in Pain There ae a lot of different kinds of pain medicines:  creams, patches and supplements. Ask your Healthcare Provider about other pain medications.   Stop Smoking Smoking can make arthritis worse.   Stop Pain Reviewed 10/14/2022 Before it gets bad. Once in pain It is harder to get rid of. Begin pain relief while you have mild pain.   Other   Other    ;  PRACTICE It  is important to practice the strategies so we can determine if they will be effective in helping to reach the goal.    Follow these specific recommendations:        If strategy does not work the first time, try it again.  We may make some changes over the next few sessions.    We may make some changes over the next few sessions, based on how they work.   [name] RN, BSN, CEN RN Case Production designer, theatre/television/film for [employer] [facility] Mobile: [phone]   12/12/2022 Assessment:  patient complaining of pain all over. Reports she was having trouble sleeping last night due to pain.  9/10 today. Reports taking all her medications as prescribed. States she has been trying to walk up the stairs with the new hand rail but has not made it up the  stairs yet.  Reports CBG today of 180.   Interventions: reviewed pain and what patient is doing for pain. Reviewed and provided written home exercise plan. Demonstrated exercises. Patient not able to perform exercises due to pain as reported.  Encouraged patient to do her exercises daily.  Reviewed with patient that OT has an appointment scheduled for later today. Also reviewed with the patient that [facility] care guide has been trying to schedule appointments for RN CM. I scheduled appointment with RNCM Juanell Fairly for 12/16/2022. Patient voiced understanding.   Plan: next home visit planned for 01/13/2023  [name] RN, BSN, CEN RN Case Manager for [employer] [facility] Mobile: [phone]   01/13/2023 Assessment: Patient forgot appointment. Ambulating well to the bathroom. Patient reports being active with [facility] RN and PT. Reports she is doing her exercises.  Interventions:  Reviewed importance of home exercise plan and offered encouragement.  Encouraged patient to talk about her concerns with her assigned RNCM and Child psychotherapist. Reviewed with patient interventions provided by [facility] and Social worker and suggested patient complete recommendations. Offered to assist patient with new CBG meter and she declined my assistance. Reviewed pending appointments. Encouraged patient to follow MD recommendations for pain control.   Plan: Next home visit planned for 02/10/2023  Lonia Chimera, RN, BSN, CEN [facility] Care Coordinator [phone]          Our next appointment is  in person at patients home  on 02/10/2023 at 1230  Please call the care guide team at [phone] if you need to cancel or reschedule your appointment.   If you are experiencing a Mental Health or Behavioral Health Crisis or need someone to talk to, please call the Suicide and Crisis Lifeline: 988 call the [place] National Suicide Prevention Lifeline: 6177265414 or TTY: 304-022-4394 TTY  765-869-4433) to talk to a trained counselor call 1-800-273-TALK (toll free, 24 hour hotline) go to [facility] [address] [place] [phone]) call 911   The patient verbalized understanding of instructions, educational materials, and care plan provided today and agreed to receive a mailed copy of patient instructions, educational materials, and care plan.   Lonia Chimera RN, BSN, Careers adviser for [employer] Phone:  [phone]

## 2023-01-14 ENCOUNTER — Other Ambulatory Visit: Payer: Self-pay

## 2023-01-14 ENCOUNTER — Telehealth: Payer: Self-pay

## 2023-01-14 DIAGNOSIS — J4489 Other specified chronic obstructive pulmonary disease: Secondary | ICD-10-CM | POA: Diagnosis not present

## 2023-01-14 DIAGNOSIS — M4807 Spinal stenosis, lumbosacral region: Secondary | ICD-10-CM | POA: Diagnosis not present

## 2023-01-14 DIAGNOSIS — I2510 Atherosclerotic heart disease of native coronary artery without angina pectoris: Secondary | ICD-10-CM | POA: Diagnosis not present

## 2023-01-14 DIAGNOSIS — I5030 Unspecified diastolic (congestive) heart failure: Secondary | ICD-10-CM | POA: Diagnosis not present

## 2023-01-14 DIAGNOSIS — E1142 Type 2 diabetes mellitus with diabetic polyneuropathy: Secondary | ICD-10-CM | POA: Diagnosis not present

## 2023-01-14 DIAGNOSIS — E1169 Type 2 diabetes mellitus with other specified complication: Secondary | ICD-10-CM | POA: Diagnosis not present

## 2023-01-14 DIAGNOSIS — M5417 Radiculopathy, lumbosacral region: Secondary | ICD-10-CM | POA: Diagnosis not present

## 2023-01-14 DIAGNOSIS — E1159 Type 2 diabetes mellitus with other circulatory complications: Secondary | ICD-10-CM | POA: Diagnosis not present

## 2023-01-14 DIAGNOSIS — I152 Hypertension secondary to endocrine disorders: Secondary | ICD-10-CM | POA: Diagnosis not present

## 2023-01-14 NOTE — Telephone Encounter (Signed)
Received call from [place], OT with Aging Gracefully program. She reports that patient would benefit from Rollator with seat.   Advised that insurance guidelines require face to face office visit for DME.   Patient is scheduled for follow up with PCP on 10/1.  If appropriate, please place order for DME and include supporting documentation in note.   Thanks.   Veronda Prude, RN

## 2023-01-14 NOTE — Progress Notes (Cosign Needed Addendum)
Shalonda Langen Oct 11, 1950 [id number]  Patient outreached by Thomasene Ripple , PharmD Candidate on 01/14/23.  Blood Pressure Readings: Last documented ambulatory systolic blood pressure: 187 Last documented ambulatory diastolic blood pressure: 111 Does the patient have a validated home blood pressure machine?: No   Medication review was performed. Is the patient taking their medications as prescribed?: Yes   The following barriers to adherence were noted: Does the patient have cost concerns?: No Does the patient have transportation concerns?: No Does the patient need assistance obtaining refills?: No Does the patient occassionally forget to take some of their prescribed medications?: No Does the patient feel like one/some of their medications make them feel poorly?: No Does the patient have questions or concerns about their medications?: No Does the patient have a follow up scheduled with their primary care provider/cardiologist?: Yes   Interventions: Interventions Completed: Medications were reviewed, Patient was educated on how to access home blood pressure machine  The patient has follow up scheduled:  PCP: [name], MD   Thomasene Ripple, Student-PharmD

## 2023-01-16 DIAGNOSIS — J4489 Other specified chronic obstructive pulmonary disease: Secondary | ICD-10-CM | POA: Diagnosis not present

## 2023-01-16 DIAGNOSIS — I5030 Unspecified diastolic (congestive) heart failure: Secondary | ICD-10-CM | POA: Diagnosis not present

## 2023-01-16 DIAGNOSIS — E1159 Type 2 diabetes mellitus with other circulatory complications: Secondary | ICD-10-CM | POA: Diagnosis not present

## 2023-01-16 DIAGNOSIS — M5417 Radiculopathy, lumbosacral region: Secondary | ICD-10-CM | POA: Diagnosis not present

## 2023-01-16 DIAGNOSIS — I152 Hypertension secondary to endocrine disorders: Secondary | ICD-10-CM | POA: Diagnosis not present

## 2023-01-16 DIAGNOSIS — M4807 Spinal stenosis, lumbosacral region: Secondary | ICD-10-CM | POA: Diagnosis not present

## 2023-01-16 DIAGNOSIS — E1142 Type 2 diabetes mellitus with diabetic polyneuropathy: Secondary | ICD-10-CM | POA: Diagnosis not present

## 2023-01-16 DIAGNOSIS — E1169 Type 2 diabetes mellitus with other specified complication: Secondary | ICD-10-CM | POA: Diagnosis not present

## 2023-01-16 DIAGNOSIS — I2510 Atherosclerotic heart disease of native coronary artery without angina pectoris: Secondary | ICD-10-CM | POA: Diagnosis not present

## 2023-01-20 ENCOUNTER — Ambulatory Visit (INDEPENDENT_AMBULATORY_CARE_PROVIDER_SITE_OTHER): Payer: Medicare HMO | Admitting: Family Medicine

## 2023-01-20 ENCOUNTER — Encounter: Payer: Self-pay | Admitting: Family Medicine

## 2023-01-20 VITALS — BP 153/70 | HR 85 | Ht 63.0 in | Wt 194.8 lb

## 2023-01-20 DIAGNOSIS — M5417 Radiculopathy, lumbosacral region: Secondary | ICD-10-CM | POA: Diagnosis not present

## 2023-01-20 DIAGNOSIS — N644 Mastodynia: Secondary | ICD-10-CM | POA: Diagnosis not present

## 2023-01-20 DIAGNOSIS — Z7984 Long term (current) use of oral hypoglycemic drugs: Secondary | ICD-10-CM

## 2023-01-20 DIAGNOSIS — E1159 Type 2 diabetes mellitus with other circulatory complications: Secondary | ICD-10-CM

## 2023-01-20 DIAGNOSIS — I5030 Unspecified diastolic (congestive) heart failure: Secondary | ICD-10-CM | POA: Diagnosis not present

## 2023-01-20 DIAGNOSIS — I152 Hypertension secondary to endocrine disorders: Secondary | ICD-10-CM | POA: Diagnosis not present

## 2023-01-20 DIAGNOSIS — M4807 Spinal stenosis, lumbosacral region: Secondary | ICD-10-CM

## 2023-01-20 DIAGNOSIS — J4489 Other specified chronic obstructive pulmonary disease: Secondary | ICD-10-CM | POA: Diagnosis not present

## 2023-01-20 DIAGNOSIS — Z23 Encounter for immunization: Secondary | ICD-10-CM

## 2023-01-20 DIAGNOSIS — E1169 Type 2 diabetes mellitus with other specified complication: Secondary | ICD-10-CM | POA: Diagnosis not present

## 2023-01-20 DIAGNOSIS — E1142 Type 2 diabetes mellitus with diabetic polyneuropathy: Secondary | ICD-10-CM | POA: Diagnosis not present

## 2023-01-20 DIAGNOSIS — I2510 Atherosclerotic heart disease of native coronary artery without angina pectoris: Secondary | ICD-10-CM | POA: Diagnosis not present

## 2023-01-20 LAB — GLUCOSE, POCT (MANUAL RESULT ENTRY): POC Glucose: 208 mg/dL — AB (ref 70–99)

## 2023-01-20 MED ORDER — ONETOUCH DELICA LANCING DEV MISC
1.0000 | Freq: Four times a day (QID) | 1 refills | Status: DC
Start: 2023-01-20 — End: 2023-12-31

## 2023-01-20 MED ORDER — LANTUS SOLOSTAR 100 UNIT/ML ~~LOC~~ SOPN
22.0000 [IU] | PEN_INJECTOR | Freq: Every day | SUBCUTANEOUS | 1 refills | Status: DC
Start: 2023-01-20 — End: 2023-02-23

## 2023-01-20 MED ORDER — ACCU-CHEK GUIDE VI STRP
ORAL_STRIP | 12 refills | Status: DC
Start: 2023-01-20 — End: 2023-12-31

## 2023-01-20 NOTE — Assessment & Plan Note (Signed)
Patient would benefit from rollator to assist with ambulation, has been seen by Occupational Therapy for this.  Placed DME order for rolling walker with seat

## 2023-01-20 NOTE — Progress Notes (Signed)
    SUBJECTIVE:   CHIEF COMPLAINT / HPI:   Follow-up for hypertension At last visit 9/16, restarted on amlodipine.  Otherwise taking Coreg 25 mg twice daily.  Previously on losartan 25 mg daily but patient was not taking this. Didn't take meds today  T2DM -Current medication regimen: Lantus 18 units daily, Ozempic 0.25 mg weekly (started at last visit 9/16) -Home CBGs: unable to track due to running out of lancets/test strips -Denies significant new polyuria, polydipsia, abdominal pain, chest pain, shortness of breath  Lab Results  Component Value Date   HGBA1C 8.9 (A) 11/07/2022   HGBA1C 9.4 (A) 05/30/2022   HGBA1C 10.1 (A) 01/16/2022    Needs rollator per Occupational Therapy (aging gracefully) History of spinal stenosis with radiculopathy which impairs her ambulation Patient agrees that she has difficulty ambulating with her current method of a cane   PERTINENT  PMH / PSH: Hypertension, diabetes, CAD, COPD, HLD, lumbar spinal stenosis  OBJECTIVE:   BP (!) 153/70   Pulse 85   Ht 5\' 3"  (1.6 m)   Wt 194 lb 12.8 oz (88.4 kg)   SpO2 98%   BMI 34.51 kg/m    General: NAD, pleasant, able to participate in exam Breast: Right breast with mild-moderate tenderness to palpation around 9:00.  No significant masses palpated.  Left breast unremarkable. Cardiac: RRR, no murmurs auscultated Respiratory: CTAB, normal WOB Abdomen: soft, non-tender, non-distended, normoactive bowel sounds Extremities: warm and well perfused, no edema or cyanosis Skin: warm and dry, no rashes noted Neuro: alert, no obvious focal deficits, speech normal.  Gait slow, using cane Psych: Normal affect and mood   ASSESSMENT/PLAN:   Assessment & Plan DM type 2 with diabetic peripheral neuropathy (HCC) Point-of-care fasting glucose 208 today, patient did not take Lantus today.  Currently on 18 units daily, increase to 22 units daily over the next 2 to 3 days and follow-up in 2 weeks.  Continue Ozempic 0.25  mg weekly, at next visit in 2 weeks likely increase to 0.5.  Refilled glucose test trips and lancets. Hypertension associated with diabetes (HCC) BP elevated today but patient did not take any medications.  Significant improvement from previous visit.  Continue current regimen of Coreg 25 mg twice daily, amlodipine 10 mg daily.  Follow-up at next visit Spinal stenosis of lumbosacral region with radiculopathy Patient would benefit from rollator to assist with ambulation, has been seen by Occupational Therapy for this.  Placed DME order for rolling walker with seat Breast pain Tenderness on exam and reported pain.  Placed order for diagnostic mammogram with ultrasound, scheduled for 10/29   [name], MD [facility]

## 2023-01-20 NOTE — Assessment & Plan Note (Signed)
BP elevated today but patient did not take any medications.  Significant improvement from previous visit.  Continue current regimen of Coreg 25 mg twice daily, amlodipine 10 mg daily.  Follow-up at next visit

## 2023-01-20 NOTE — Telephone Encounter (Signed)
Community message sent to [employer]. Will await response.   Jozlyn Schatz C January Bergthold, RN  

## 2023-01-20 NOTE — Patient Instructions (Signed)
Please continue taking your blood pressure medicines  Please monitor your blood sugar daily every morning and 2 to 3 hours after eating a meal  Please increase your Lantus to 20 units tomorrow, then 22 units after another 2 days as long as your blood sugars are above 120  I placed an order for your walker  A mammogram and ultrasound for your breast has been ordered

## 2023-01-20 NOTE — Assessment & Plan Note (Signed)
Point-of-care fasting glucose 208 today, patient did not take Lantus today.  Currently on 18 units daily, increase to 22 units daily over the next 2 to 3 days and follow-up in 2 weeks.  Continue Ozempic 0.25 mg weekly, at next visit in 2 weeks likely increase to 0.5.  Refilled glucose test trips and lancets.

## 2023-01-21 DIAGNOSIS — E1159 Type 2 diabetes mellitus with other circulatory complications: Secondary | ICD-10-CM | POA: Diagnosis not present

## 2023-01-21 DIAGNOSIS — I2510 Atherosclerotic heart disease of native coronary artery without angina pectoris: Secondary | ICD-10-CM | POA: Diagnosis not present

## 2023-01-21 DIAGNOSIS — M4807 Spinal stenosis, lumbosacral region: Secondary | ICD-10-CM | POA: Diagnosis not present

## 2023-01-21 DIAGNOSIS — J4489 Other specified chronic obstructive pulmonary disease: Secondary | ICD-10-CM | POA: Diagnosis not present

## 2023-01-21 DIAGNOSIS — I152 Hypertension secondary to endocrine disorders: Secondary | ICD-10-CM | POA: Diagnosis not present

## 2023-01-21 DIAGNOSIS — M5417 Radiculopathy, lumbosacral region: Secondary | ICD-10-CM | POA: Diagnosis not present

## 2023-01-21 DIAGNOSIS — E1142 Type 2 diabetes mellitus with diabetic polyneuropathy: Secondary | ICD-10-CM | POA: Diagnosis not present

## 2023-01-21 DIAGNOSIS — E1169 Type 2 diabetes mellitus with other specified complication: Secondary | ICD-10-CM | POA: Diagnosis not present

## 2023-01-21 DIAGNOSIS — I5030 Unspecified diastolic (congestive) heart failure: Secondary | ICD-10-CM | POA: Diagnosis not present

## 2023-01-22 NOTE — Telephone Encounter (Signed)
Receipt confirmed by [employer].   Beyounce Dickens C Christopherjame Carnell, RN  

## 2023-01-23 DIAGNOSIS — E1169 Type 2 diabetes mellitus with other specified complication: Secondary | ICD-10-CM | POA: Diagnosis not present

## 2023-01-23 DIAGNOSIS — E1142 Type 2 diabetes mellitus with diabetic polyneuropathy: Secondary | ICD-10-CM | POA: Diagnosis not present

## 2023-01-23 DIAGNOSIS — I152 Hypertension secondary to endocrine disorders: Secondary | ICD-10-CM | POA: Diagnosis not present

## 2023-01-23 DIAGNOSIS — I2510 Atherosclerotic heart disease of native coronary artery without angina pectoris: Secondary | ICD-10-CM | POA: Diagnosis not present

## 2023-01-23 DIAGNOSIS — I5030 Unspecified diastolic (congestive) heart failure: Secondary | ICD-10-CM | POA: Diagnosis not present

## 2023-01-23 DIAGNOSIS — M4807 Spinal stenosis, lumbosacral region: Secondary | ICD-10-CM | POA: Diagnosis not present

## 2023-01-23 DIAGNOSIS — M5417 Radiculopathy, lumbosacral region: Secondary | ICD-10-CM | POA: Diagnosis not present

## 2023-01-23 DIAGNOSIS — E1159 Type 2 diabetes mellitus with other circulatory complications: Secondary | ICD-10-CM | POA: Diagnosis not present

## 2023-01-23 DIAGNOSIS — J4489 Other specified chronic obstructive pulmonary disease: Secondary | ICD-10-CM | POA: Diagnosis not present

## 2023-01-27 ENCOUNTER — Ambulatory Visit: Payer: Self-pay | Admitting: *Deleted

## 2023-01-27 NOTE — Patient Instructions (Signed)
Visit Information  Thank you for taking time to visit with me today. Please don't hesitate to contact me if I can be of assistance to you.   Following are the goals we discussed today:   Goals Addressed             This Visit's Progress    COMPLETED: Need transportation assistance and grief counseling       Activities and task to complete in order to accomplish goals.   Continue to use [facility]AT now that it is approved, use that as needed- granddaughter to help with getting a UMO card Call [facility] to schedule your initial visits for medication and counseling appointment. Start / continue relaxed breathing 3 times daily Keep all upcoming appointment discussed today Continue with compliance of taking medication prescribed by Doctor Inquire with DSS about medicaid eligiblilty and increasing food stamps allotment              Please call your PCP office or the care guide team at [phone] if you need to schedule further appointment.   If you are experiencing a Mental Health or Behavioral Health Crisis or need someone to talk to, please call 911   The patient verbalized understanding of instructions, educational materials, and care plan provided today and DECLINED offer to receive copy of patient instructions, educational materials, and care plan.   No further follow up required:    Reece Levy, MSW, LCSW Clinical Social Worker [phone]

## 2023-01-27 NOTE — Patient Outreach (Signed)
  Care Coordination   Follow Up Visit Note   01/27/2023 Name: [name] [name] MRN: [id number] DOB: 1950-11-24  [name] [name] is a 72 y.o. year old female who sees [name], MD for primary care. I spoke with  [name] [name] by phone today.  What matters to the patients health and wellness today?  Home Health coming out.    Goals Addressed             This Visit's Progress    COMPLETED: Need transportation assistance and grief counseling       Activities and task to complete in order to accomplish goals.   Continue to use SCAT now that it is approved, use that as needed- granddaughter to help with getting a UMO card Call [facility] to schedule your initial visits for medication and counseling appointment. Start / continue relaxed breathing 3 times daily Keep all upcoming appointment discussed today Continue with compliance of taking medication prescribed by Doctor Inquire with DSS about medicaid eligiblilty and increasing food stamps allotment          SDOH assessments and interventions completed:  Yes     Care Coordination Interventions:  Yes, provided  Interventions Today    Flowsheet Row Most Recent Value  Chronic Disease   Chronic disease during today's visit Other  [depression/grief]  General Interventions   General Interventions Discussed/Reviewed Community Resources  [Reminded pt to apply for Medicaid- reports the [facility] staff are assisting with this]  Exercise Interventions   Exercise Discussed/Reviewed Exercise Discussed, Physical Activity  [HHPT involved and pt got new walker]  Mental Health Interventions   Mental Health Discussed/Reviewed --  [Pt declines follow up with referral originally sent to [facility] for counseling- getting grief counseling at church she reports]  Nutrition Interventions   Nutrition Discussed/Reviewed Nutrition Discussed  [getting food stamps]       Follow up plan: No further intervention required.    Encounter Outcome:  Patient Visit Completed

## 2023-01-28 ENCOUNTER — Telehealth: Payer: Self-pay

## 2023-01-28 DIAGNOSIS — E1159 Type 2 diabetes mellitus with other circulatory complications: Secondary | ICD-10-CM | POA: Diagnosis not present

## 2023-01-28 DIAGNOSIS — M4807 Spinal stenosis, lumbosacral region: Secondary | ICD-10-CM | POA: Diagnosis not present

## 2023-01-28 DIAGNOSIS — E1142 Type 2 diabetes mellitus with diabetic polyneuropathy: Secondary | ICD-10-CM | POA: Diagnosis not present

## 2023-01-28 DIAGNOSIS — J4489 Other specified chronic obstructive pulmonary disease: Secondary | ICD-10-CM | POA: Diagnosis not present

## 2023-01-28 DIAGNOSIS — I5030 Unspecified diastolic (congestive) heart failure: Secondary | ICD-10-CM | POA: Diagnosis not present

## 2023-01-28 DIAGNOSIS — I2510 Atherosclerotic heart disease of native coronary artery without angina pectoris: Secondary | ICD-10-CM | POA: Diagnosis not present

## 2023-01-28 DIAGNOSIS — I152 Hypertension secondary to endocrine disorders: Secondary | ICD-10-CM | POA: Diagnosis not present

## 2023-01-28 DIAGNOSIS — M5417 Radiculopathy, lumbosacral region: Secondary | ICD-10-CM | POA: Diagnosis not present

## 2023-01-28 DIAGNOSIS — E1169 Type 2 diabetes mellitus with other specified complication: Secondary | ICD-10-CM | POA: Diagnosis not present

## 2023-01-28 NOTE — Patient Outreach (Signed)
  Care Coordination   01/28/2023 Name: [name] [name] MRN: [id number] DOB: 23-Aug-1950   Care Coordination Outreach Attempts:  A second unsuccessful outreach was attempted today to offer the patient with information about available care coordination services.  Follow Up Plan:  Additional outreach attempts will be made to offer the patient care coordination information and services.   Encounter Outcome:  No Answer   Care Coordination Interventions:  No, not indicated    Juanell Fairly RN, BSN, [facility] [employer] blower/designer Phone: [phone]

## 2023-01-29 DIAGNOSIS — M4807 Spinal stenosis, lumbosacral region: Secondary | ICD-10-CM | POA: Diagnosis not present

## 2023-01-29 DIAGNOSIS — J4489 Other specified chronic obstructive pulmonary disease: Secondary | ICD-10-CM | POA: Diagnosis not present

## 2023-01-29 DIAGNOSIS — I2510 Atherosclerotic heart disease of native coronary artery without angina pectoris: Secondary | ICD-10-CM | POA: Diagnosis not present

## 2023-01-29 DIAGNOSIS — E1169 Type 2 diabetes mellitus with other specified complication: Secondary | ICD-10-CM | POA: Diagnosis not present

## 2023-01-29 DIAGNOSIS — I152 Hypertension secondary to endocrine disorders: Secondary | ICD-10-CM | POA: Diagnosis not present

## 2023-01-29 DIAGNOSIS — I5030 Unspecified diastolic (congestive) heart failure: Secondary | ICD-10-CM | POA: Diagnosis not present

## 2023-01-29 DIAGNOSIS — M5417 Radiculopathy, lumbosacral region: Secondary | ICD-10-CM | POA: Diagnosis not present

## 2023-01-29 DIAGNOSIS — E1159 Type 2 diabetes mellitus with other circulatory complications: Secondary | ICD-10-CM | POA: Diagnosis not present

## 2023-01-29 DIAGNOSIS — E1142 Type 2 diabetes mellitus with diabetic polyneuropathy: Secondary | ICD-10-CM | POA: Diagnosis not present

## 2023-02-03 ENCOUNTER — Ambulatory Visit: Payer: Medicare HMO | Admitting: Family Medicine

## 2023-02-04 DIAGNOSIS — J4489 Other specified chronic obstructive pulmonary disease: Secondary | ICD-10-CM | POA: Diagnosis not present

## 2023-02-04 DIAGNOSIS — E1169 Type 2 diabetes mellitus with other specified complication: Secondary | ICD-10-CM | POA: Diagnosis not present

## 2023-02-04 DIAGNOSIS — I152 Hypertension secondary to endocrine disorders: Secondary | ICD-10-CM | POA: Diagnosis not present

## 2023-02-04 DIAGNOSIS — M4807 Spinal stenosis, lumbosacral region: Secondary | ICD-10-CM | POA: Diagnosis not present

## 2023-02-04 DIAGNOSIS — E1142 Type 2 diabetes mellitus with diabetic polyneuropathy: Secondary | ICD-10-CM | POA: Diagnosis not present

## 2023-02-04 DIAGNOSIS — I2510 Atherosclerotic heart disease of native coronary artery without angina pectoris: Secondary | ICD-10-CM | POA: Diagnosis not present

## 2023-02-04 DIAGNOSIS — E1159 Type 2 diabetes mellitus with other circulatory complications: Secondary | ICD-10-CM | POA: Diagnosis not present

## 2023-02-04 DIAGNOSIS — M5417 Radiculopathy, lumbosacral region: Secondary | ICD-10-CM | POA: Diagnosis not present

## 2023-02-04 DIAGNOSIS — I5030 Unspecified diastolic (congestive) heart failure: Secondary | ICD-10-CM | POA: Diagnosis not present

## 2023-02-05 DIAGNOSIS — J4489 Other specified chronic obstructive pulmonary disease: Secondary | ICD-10-CM | POA: Diagnosis not present

## 2023-02-05 DIAGNOSIS — E1159 Type 2 diabetes mellitus with other circulatory complications: Secondary | ICD-10-CM | POA: Diagnosis not present

## 2023-02-05 DIAGNOSIS — I152 Hypertension secondary to endocrine disorders: Secondary | ICD-10-CM | POA: Diagnosis not present

## 2023-02-05 DIAGNOSIS — E1169 Type 2 diabetes mellitus with other specified complication: Secondary | ICD-10-CM | POA: Diagnosis not present

## 2023-02-05 DIAGNOSIS — M4807 Spinal stenosis, lumbosacral region: Secondary | ICD-10-CM | POA: Diagnosis not present

## 2023-02-05 DIAGNOSIS — I2510 Atherosclerotic heart disease of native coronary artery without angina pectoris: Secondary | ICD-10-CM | POA: Diagnosis not present

## 2023-02-05 DIAGNOSIS — I5030 Unspecified diastolic (congestive) heart failure: Secondary | ICD-10-CM | POA: Diagnosis not present

## 2023-02-05 DIAGNOSIS — M5417 Radiculopathy, lumbosacral region: Secondary | ICD-10-CM | POA: Diagnosis not present

## 2023-02-05 DIAGNOSIS — E1142 Type 2 diabetes mellitus with diabetic polyneuropathy: Secondary | ICD-10-CM | POA: Diagnosis not present

## 2023-02-06 DIAGNOSIS — M4807 Spinal stenosis, lumbosacral region: Secondary | ICD-10-CM | POA: Diagnosis not present

## 2023-02-06 DIAGNOSIS — M5417 Radiculopathy, lumbosacral region: Secondary | ICD-10-CM | POA: Diagnosis not present

## 2023-02-06 DIAGNOSIS — E1142 Type 2 diabetes mellitus with diabetic polyneuropathy: Secondary | ICD-10-CM | POA: Diagnosis not present

## 2023-02-06 DIAGNOSIS — J4489 Other specified chronic obstructive pulmonary disease: Secondary | ICD-10-CM | POA: Diagnosis not present

## 2023-02-06 DIAGNOSIS — I152 Hypertension secondary to endocrine disorders: Secondary | ICD-10-CM | POA: Diagnosis not present

## 2023-02-06 DIAGNOSIS — E1169 Type 2 diabetes mellitus with other specified complication: Secondary | ICD-10-CM | POA: Diagnosis not present

## 2023-02-06 DIAGNOSIS — I2510 Atherosclerotic heart disease of native coronary artery without angina pectoris: Secondary | ICD-10-CM | POA: Diagnosis not present

## 2023-02-06 DIAGNOSIS — E1159 Type 2 diabetes mellitus with other circulatory complications: Secondary | ICD-10-CM | POA: Diagnosis not present

## 2023-02-06 DIAGNOSIS — I5030 Unspecified diastolic (congestive) heart failure: Secondary | ICD-10-CM | POA: Diagnosis not present

## 2023-02-09 DIAGNOSIS — M4807 Spinal stenosis, lumbosacral region: Secondary | ICD-10-CM | POA: Diagnosis not present

## 2023-02-09 DIAGNOSIS — E1159 Type 2 diabetes mellitus with other circulatory complications: Secondary | ICD-10-CM | POA: Diagnosis not present

## 2023-02-09 DIAGNOSIS — J4489 Other specified chronic obstructive pulmonary disease: Secondary | ICD-10-CM | POA: Diagnosis not present

## 2023-02-09 DIAGNOSIS — E1169 Type 2 diabetes mellitus with other specified complication: Secondary | ICD-10-CM | POA: Diagnosis not present

## 2023-02-09 DIAGNOSIS — I5030 Unspecified diastolic (congestive) heart failure: Secondary | ICD-10-CM | POA: Diagnosis not present

## 2023-02-09 DIAGNOSIS — M5417 Radiculopathy, lumbosacral region: Secondary | ICD-10-CM | POA: Diagnosis not present

## 2023-02-09 DIAGNOSIS — I2510 Atherosclerotic heart disease of native coronary artery without angina pectoris: Secondary | ICD-10-CM | POA: Diagnosis not present

## 2023-02-09 DIAGNOSIS — I152 Hypertension secondary to endocrine disorders: Secondary | ICD-10-CM | POA: Diagnosis not present

## 2023-02-09 DIAGNOSIS — E1142 Type 2 diabetes mellitus with diabetic polyneuropathy: Secondary | ICD-10-CM | POA: Diagnosis not present

## 2023-02-10 ENCOUNTER — Other Ambulatory Visit: Payer: Self-pay | Admitting: *Deleted

## 2023-02-11 NOTE — Patient Outreach (Signed)
Aging Gracefully Program  02/11/2023  [name] [name] March 07, 1951 [id number]   02/10/2023  Arrived for scheduled home visit. No answer at the door. No answer for multiple telephone calls, including emergency contacts. Knocked at the back door. No answer. Left multiple voicemail messages for client. Reviewed EPIC, no MD appointments noted during this time.   02/11/2023  Attempted to reach client again to reschedule appointment. However, no answer. Left voicemail message to request return call again.   Raiford Noble, MSN, RN, BSN RN Case Production designer, theatre/television/film for Aging Brewing technologist Health  [facility] Direct Dial: [phone]

## 2023-02-12 ENCOUNTER — Other Ambulatory Visit: Payer: Self-pay | Admitting: *Deleted

## 2023-02-12 ENCOUNTER — Other Ambulatory Visit: Payer: Self-pay | Admitting: Family Medicine

## 2023-02-12 DIAGNOSIS — E1142 Type 2 diabetes mellitus with diabetic polyneuropathy: Secondary | ICD-10-CM

## 2023-02-12 NOTE — Patient Outreach (Signed)
Aging Gracefully Program  02/12/2023  Noble Subasic 1950-11-08 [id number]  Attempted to reach Mrs. Brownstein by phone to schedule Aging Gracefully home visit. No answer. HIPAA compliant voicemail message left to request return call.    Raiford Noble, MSN, RN, BSN RN Case Production designer, theatre/television/film for Aging Brewing technologist Health  [facility] Direct Dial: [phone]

## 2023-02-13 ENCOUNTER — Other Ambulatory Visit: Payer: Self-pay | Admitting: *Deleted

## 2023-02-13 NOTE — Patient Outreach (Signed)
Aging Gracefully Program  02/13/2023  [name] [name] [birth year] [id number]  Telephone call received from [name], [name]. [name] reports [name] was at the [employer] on this past Tuesday. Hence not being home for AG RN home visit. [name] asks Clinical research associate to schedule appointment in 2 weeks because [name] has several appointments scheduled next week.   Aging Gracefully RN home visit #4 scheduled for 02/27/23 at 1 pm.   Writer provided [name] with contact information.    Raiford Noble, MSN, RN, BSN RN Case Production designer, theatre/television/film for Aging Brewing technologist Health  [facility] Direct Dial: [phone]

## 2023-02-13 NOTE — Patient Outreach (Signed)
Aging Gracefully Program  02/13/2023  [name] [name] November 02, 1950 [id number]   Telephone call made to Ms. [place]ad[place] to schedule home visit #4 with Aging Clinical cytogeneticist. No answer. HIPAA compliant voicemail message left to request return call.    Raiford Noble, MSN, RN, BSN RN Case Production designer, theatre/television/film for Aging Brewing technologist Health  [facility] Direct Dial: [phone]

## 2023-02-16 DIAGNOSIS — J4489 Other specified chronic obstructive pulmonary disease: Secondary | ICD-10-CM | POA: Diagnosis not present

## 2023-02-16 DIAGNOSIS — M4807 Spinal stenosis, lumbosacral region: Secondary | ICD-10-CM | POA: Diagnosis not present

## 2023-02-16 DIAGNOSIS — M5417 Radiculopathy, lumbosacral region: Secondary | ICD-10-CM | POA: Diagnosis not present

## 2023-02-16 DIAGNOSIS — I2510 Atherosclerotic heart disease of native coronary artery without angina pectoris: Secondary | ICD-10-CM | POA: Diagnosis not present

## 2023-02-16 DIAGNOSIS — E1159 Type 2 diabetes mellitus with other circulatory complications: Secondary | ICD-10-CM | POA: Diagnosis not present

## 2023-02-16 DIAGNOSIS — E1142 Type 2 diabetes mellitus with diabetic polyneuropathy: Secondary | ICD-10-CM | POA: Diagnosis not present

## 2023-02-16 DIAGNOSIS — E1169 Type 2 diabetes mellitus with other specified complication: Secondary | ICD-10-CM | POA: Diagnosis not present

## 2023-02-16 DIAGNOSIS — I152 Hypertension secondary to endocrine disorders: Secondary | ICD-10-CM | POA: Diagnosis not present

## 2023-02-16 DIAGNOSIS — I5030 Unspecified diastolic (congestive) heart failure: Secondary | ICD-10-CM | POA: Diagnosis not present

## 2023-02-17 ENCOUNTER — Ambulatory Visit
Admission: RE | Admit: 2023-02-17 | Discharge: 2023-02-17 | Disposition: A | Discharge status: Home / Self Care | Payer: Medicare HMO | Source: Ambulatory Visit | Attending: Family Medicine | Admitting: Family Medicine

## 2023-02-17 DIAGNOSIS — N644 Mastodynia: Secondary | ICD-10-CM

## 2023-02-17 DIAGNOSIS — N6459 Other signs and symptoms in breast: Secondary | ICD-10-CM | POA: Diagnosis not present

## 2023-02-18 ENCOUNTER — Telehealth: Payer: Self-pay

## 2023-02-18 DIAGNOSIS — I5030 Unspecified diastolic (congestive) heart failure: Secondary | ICD-10-CM | POA: Diagnosis not present

## 2023-02-18 DIAGNOSIS — E1142 Type 2 diabetes mellitus with diabetic polyneuropathy: Secondary | ICD-10-CM | POA: Diagnosis not present

## 2023-02-18 DIAGNOSIS — E1169 Type 2 diabetes mellitus with other specified complication: Secondary | ICD-10-CM | POA: Diagnosis not present

## 2023-02-18 DIAGNOSIS — I2510 Atherosclerotic heart disease of native coronary artery without angina pectoris: Secondary | ICD-10-CM | POA: Diagnosis not present

## 2023-02-18 DIAGNOSIS — E1159 Type 2 diabetes mellitus with other circulatory complications: Secondary | ICD-10-CM | POA: Diagnosis not present

## 2023-02-18 DIAGNOSIS — M4807 Spinal stenosis, lumbosacral region: Secondary | ICD-10-CM | POA: Diagnosis not present

## 2023-02-18 DIAGNOSIS — I152 Hypertension secondary to endocrine disorders: Secondary | ICD-10-CM | POA: Diagnosis not present

## 2023-02-18 DIAGNOSIS — M5417 Radiculopathy, lumbosacral region: Secondary | ICD-10-CM | POA: Diagnosis not present

## 2023-02-18 DIAGNOSIS — J4489 Other specified chronic obstructive pulmonary disease: Secondary | ICD-10-CM | POA: Diagnosis not present

## 2023-02-18 NOTE — Patient Outreach (Signed)
  Care Coordination  Outreach Note  02/18/2023 Name: [name] [name] MRN: [id number] DOB: Dec 12, 1950   Care Coordination Outreach Attempts: A third unsuccessful outreach was attempted today to offer the patient with information about available care coordination services.  Follow Up Plan:  No further outreach attempts will be made at this time. We have been unable to contact the patient to offer or enroll patient in care coordination services  Encounter Outcome:  No Answer Juanell Fairly RN, BSN, [facility]  Care Coordinator Phone: [phone]

## 2023-02-23 ENCOUNTER — Encounter: Payer: Self-pay | Admitting: Family Medicine

## 2023-02-23 ENCOUNTER — Ambulatory Visit (INDEPENDENT_AMBULATORY_CARE_PROVIDER_SITE_OTHER): Payer: Medicare HMO | Admitting: Family Medicine

## 2023-02-23 VITALS — BP 169/72 | HR 70 | Temp 98.6°F | Ht 63.0 in | Wt 194.2 lb

## 2023-02-23 DIAGNOSIS — E1159 Type 2 diabetes mellitus with other circulatory complications: Secondary | ICD-10-CM | POA: Diagnosis not present

## 2023-02-23 DIAGNOSIS — Z7985 Long-term (current) use of injectable non-insulin antidiabetic drugs: Secondary | ICD-10-CM

## 2023-02-23 DIAGNOSIS — J019 Acute sinusitis, unspecified: Secondary | ICD-10-CM | POA: Diagnosis not present

## 2023-02-23 DIAGNOSIS — J4489 Other specified chronic obstructive pulmonary disease: Secondary | ICD-10-CM | POA: Diagnosis not present

## 2023-02-23 DIAGNOSIS — E1142 Type 2 diabetes mellitus with diabetic polyneuropathy: Secondary | ICD-10-CM

## 2023-02-23 DIAGNOSIS — B9689 Other specified bacterial agents as the cause of diseases classified elsewhere: Secondary | ICD-10-CM

## 2023-02-23 DIAGNOSIS — M5417 Radiculopathy, lumbosacral region: Secondary | ICD-10-CM | POA: Diagnosis not present

## 2023-02-23 DIAGNOSIS — E1169 Type 2 diabetes mellitus with other specified complication: Secondary | ICD-10-CM | POA: Diagnosis not present

## 2023-02-23 DIAGNOSIS — I5030 Unspecified diastolic (congestive) heart failure: Secondary | ICD-10-CM | POA: Diagnosis not present

## 2023-02-23 DIAGNOSIS — I2510 Atherosclerotic heart disease of native coronary artery without angina pectoris: Secondary | ICD-10-CM | POA: Diagnosis not present

## 2023-02-23 DIAGNOSIS — I152 Hypertension secondary to endocrine disorders: Secondary | ICD-10-CM

## 2023-02-23 DIAGNOSIS — M4807 Spinal stenosis, lumbosacral region: Secondary | ICD-10-CM | POA: Diagnosis not present

## 2023-02-23 LAB — POCT GLYCOSYLATED HEMOGLOBIN (HGB A1C): HbA1c, POC (controlled diabetic range): 8 % — AB (ref 0.0–7.0)

## 2023-02-23 MED ORDER — LANTUS SOLOSTAR 100 UNIT/ML ~~LOC~~ SOPN: 18.0000 [IU] | PEN_INJECTOR | Freq: Every day | SUBCUTANEOUS | 1 refills | Status: DC

## 2023-02-23 MED ORDER — AMOXICILLIN-POT CLAVULANATE 875-125 MG PO TABS: 1.0000 | ORAL_TABLET | Freq: Two times a day (BID) | ORAL | 0 refills | Status: AC

## 2023-02-23 MED ORDER — SEMAGLUTIDE(0.25 OR 0.5MG/DOS) 2 MG/1.5ML ~~LOC~~ SOPN: 0.5000 mg | PEN_INJECTOR | SUBCUTANEOUS | 0 refills | Status: DC

## 2023-02-23 NOTE — Progress Notes (Signed)
    SUBJECTIVE:   CHIEF COMPLAINT / HPI:   Follow-up for diabetes and hypertension  Seen 10/1.  At that time, Lantus was increased to 22 units daily.    T2DM -Current medication regimen: Has been taking Lantus 18u daily, Ozempic 0.25 mg weekly (started 9/16). She briefly increased lantus to 22u which helped but went back to 18u. -Home CBGs: Often in low 200s. Fasting today was 203. Lowest was 164 recently. -Denies polyuria, polydipsia, abdominal pain, chest pain, shortness of breath  Lab Results  Component Value Date   HGBA1C 8.9 (A) 11/07/2022   HGBA1C 9.4 (A) 05/30/2022   HGBA1C 10.1 (A) 01/16/2022    HTN Current medications include Coreg 25 mg twice daily, amlodipine 10 mg daily BP elevated at last visit although patient had not taken her medications Today reports that she did not take her Coreg today and also feels that her acute illness is causing her BP to be elevated.  States that BP at home has overall been normal but she does not recall specific numbers    Has been congested with coughing for about 2.5 weeks, has tried zyrtec and OTC cough medications Reports subjective fevers at night Coughing through the night Cough is frequently productive of green mucus. No hemoptysis  Endorses sinus pain  PERTINENT  PMH / PSH: HTN, T2DM, CAD, COPD, HLD, spinal stenosis  OBJECTIVE:   BP (!) 161/70   Pulse 70   Temp 98.6 F (37 C) (Oral)   Ht 5\' 3"  (1.6 m)   Wt 194 lb 4 oz (88.1 kg)   SpO2 100%   BMI 34.41 kg/m    General: NAD, pleasant, able to participate in exam HEENT: PERRLA, EOMI, tenderness to palpation of bilateral frontal and maxillary sinuses Cardiac: RRR, no murmurs auscultated Respiratory: CTAB, normal WOB Abdomen: soft, non-tender, non-distended, normoactive bowel sounds Extremities: warm and well perfused, no edema or cyanosis Skin: warm and dry, no rashes noted Neuro: alert, no obvious focal deficits, speech normal Psych: Normal affect and  mood  ASSESSMENT/PLAN:   Assessment & Plan DM type 2 with diabetic peripheral neuropathy (HCC) Uncontrolled, CBGs elevated at home, A1c 8.0 today from 8.9 three months ago which is reassuring.  Increase Ozempic to 0.5 mg weekly, follow-up in 2 weeks. Continue Lantus 18u every day.  At next visit consider increase in Lantus if CBG still high. Hypertension associated with diabetes (HCC) BP elevated today although patient did not take her medications and she is also having acute illness and chronic pain.  Reports being well-controlled at home overall.  Encouraged patient to continue taking current medications and keep BP log.  Continue to monitor Acute bacterial sinusitis Symptoms consistent with acute bacterial sinusitis given tenderness on exam with productive cough and subjective fevers ongoing for greater than 10 days.  Treat with Augmentin twice daily x 7 days   [name], MD [facility]

## 2023-02-23 NOTE — Assessment & Plan Note (Addendum)
Uncontrolled, CBGs elevated at home, A1c 8.0 today from 8.9 three months ago which is reassuring.  Increase Ozempic to 0.5 mg weekly, follow-up in 2 weeks. Continue Lantus 18u every day.  At next visit consider increase in Lantus if CBG still high.

## 2023-02-23 NOTE — Assessment & Plan Note (Signed)
BP elevated today although patient did not take her medications and she is also having acute illness and chronic pain.  Reports being well-controlled at home overall.  Encouraged patient to continue taking current medications and keep BP log.  Continue to monitor

## 2023-02-23 NOTE — Patient Instructions (Signed)
Please increase Ozempic to 0.5 mg weekly  Please continue taking 18 units of Lantus daily  Please follow-up in 2 weeks  Please take all of your blood pressure medications  Please take Augmentin (antibiotic) twice daily for 7 days for your sinus infection

## 2023-02-26 ENCOUNTER — Telehealth: Payer: Self-pay | Admitting: Pharmacist

## 2023-02-26 DIAGNOSIS — E1169 Type 2 diabetes mellitus with other specified complication: Secondary | ICD-10-CM | POA: Diagnosis not present

## 2023-02-26 DIAGNOSIS — E1142 Type 2 diabetes mellitus with diabetic polyneuropathy: Secondary | ICD-10-CM | POA: Diagnosis not present

## 2023-02-26 DIAGNOSIS — I5030 Unspecified diastolic (congestive) heart failure: Secondary | ICD-10-CM | POA: Diagnosis not present

## 2023-02-26 DIAGNOSIS — M4807 Spinal stenosis, lumbosacral region: Secondary | ICD-10-CM | POA: Diagnosis not present

## 2023-02-26 DIAGNOSIS — M5417 Radiculopathy, lumbosacral region: Secondary | ICD-10-CM | POA: Diagnosis not present

## 2023-02-26 DIAGNOSIS — J4489 Other specified chronic obstructive pulmonary disease: Secondary | ICD-10-CM | POA: Diagnosis not present

## 2023-02-26 DIAGNOSIS — I152 Hypertension secondary to endocrine disorders: Secondary | ICD-10-CM | POA: Diagnosis not present

## 2023-02-26 DIAGNOSIS — I2510 Atherosclerotic heart disease of native coronary artery without angina pectoris: Secondary | ICD-10-CM | POA: Diagnosis not present

## 2023-02-26 DIAGNOSIS — E1159 Type 2 diabetes mellitus with other circulatory complications: Secondary | ICD-10-CM | POA: Diagnosis not present

## 2023-02-26 NOTE — Telephone Encounter (Signed)
Openned in error

## 2023-02-27 ENCOUNTER — Encounter: Payer: Self-pay | Admitting: *Deleted

## 2023-02-27 ENCOUNTER — Other Ambulatory Visit: Payer: Self-pay | Admitting: *Deleted

## 2023-02-27 NOTE — Patient Outreach (Deleted)
Aging Gracefully Program  RN Visit  02/27/2023  [name] [name] 01/25/1951 [id number]  Visit:  RN Visit Number: 4- Fourth Visit  RN TIME CALCULATION: Start TIme:  RN Start Time Calculation: 1300 End Time:    Total Minutes:     Readiness To Change Score:  Readiness to Change Score: 10  Universal RN Interventions: Calendar Distribution: Yes Exercise Review: Yes Medications: Yes Medication Changes: Yes Mood: Yes Pain: Yes PCP Advocacy/Support: Yes Fall Prevention: Yes Incontinence: Yes Clinician View Of Client Situation: Arrived to client's home. Noted front door ramp completed. Mrs. [name] [name] answered the door slowly after several knocks and door bell rings.Ambulates with rollator. Window blinds closed. Therefore somewhat dark inside home. Great grandson in living room occupied with game. Mrs. [name] [name] pleasant and engaging, Noted cough. Client View Of His/Her Situation: Mrs. [name] [name] reports she has been sick for several weeks. Reports having a cough and was recently placed on antibiotics. Denies having covid. Also reports diarrhea that has worsened since starting abx. Reports ongoing pain.Uses muscle rub, icy hot, and heat for pain relief. Reports she has been unable to sleep as of late due to depression and anxiety. Endorses she has lost both her son and grandson within the past 9 months. Goes to grief counseling thru her church. States she used to be on Seroquel. States needs to be on it again. Reports her heating unit is not working. States she turns on the oven to warm up the home. States she plans to ask [facility] about assistance. States heat has been out for several months. Also reports she has gone into debt with purchasing window units for air conditioner. Endorses bannisters have also been installed and she is waiting for kitchen floor, bathroom, ceiling, and back ramp work to begin.  Healthcare Provider Communication: Did Surveyor, mining With [employer] Provider?: No According  to Client, Did PCP Report Communication With An Aging Gracefully RN?: No  Clinician View of Client Situation: Clinician View Of Client Situation: Arrived to client's home. Noted front door ramp completed. Mrs. [name] [name] answered the door slowly after several knocks and door bell rings.Ambulates with rollator. Window blinds closed. Therefore somewhat dark inside home. Great grandson in living room occupied with game. Mrs. [name] [name] pleasant and engaging, Noted cough. Client's View of His/Her Situation: Client View Of His/Her Situation: Mrs. [name] [name] reports she has been sick for several weeks. Reports having a cough and was recently placed on antibiotics. Denies having covid. Also reports diarrhea that has worsened since starting abx. Reports ongoing pain.Uses muscle rub, icy hot, and heat for pain relief. Reports she has been unable to sleep as of late due to depression and anxiety. Endorses she has lost both her son and grandson within the past 9 months. Goes to grief counseling thru her church. States she used to be on Seroquel. States needs to be on it again. Reports her heating unit is not working. States she turns on the oven to warm up the home. States she plans to ask [facility] about assistance. States heat has been out for several months. Also reports she has gone into debt with purchasing window units for air conditioner. Endorses bannisters have also been installed and she is waiting for kitchen floor, bathroom, ceiling, and back ramp work to begin.  Medication Assessment:    OT Update: ***  Session Summary: ***   Goals Addressed               This Visit's Progress     AG RN (pt-stated)  Goals:  Patient will report decrease in pain in the next 160 days.   10/14/2022 Assessment:   Patient with chronic pain in her back due to injury.  Patient with limited ability to move about in her home due to inability to climb steps.  (Hand railing only on one side of the staircase)  Patient  reports she takes her medication for pain ( gabapentin) and uses a heating pad. Interventions:  Reviewed use of ice and or heat.  Reviewed pain control. Reviewed ability to do exercise. Provided a [facility] calendar and reviewed the tool. Wrote all pending appointments in calendar. Encouraged patient to call MD for an appointment ( way past due). Encouraged patient to get prescriptions filled. Reviewed with patient to call the [facility] Customer service number to get assistance with a new CBG meter.  Plan: next home visit planned for 11/11/2022  CLIENT/RN ACTION PLAN - PAIN  Registered Nurse:  [name] RN   Date:    10/14/2022  Client Name:  [name] [name] Client ID:    Target Area:  PAIN   Why Problem May Occur: Previous back injury   Target Goal:  Patient will report decrease in back pain  in the next 160 days.   STRATEGIES Coping Strategies Ideas:  Heat  Reviewed 10/14/2022 Use heating pad or warm towel on painful area no more than 20 minutes at a time. Don't sleep with a heating pad on - it could burn your skin   Ice  Reviewed 10/14/2022 Use ice pack or frozen bag of vegetables on painful area.   Leave cold pack on for less than 20 minutes. Ice can burn your skin.  Don't leave ice on longer than 20 minutes.   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 Turn off the TV and turn on the radio.   Prayer/Meditation Prayer and meditation can decrease pain   Other   Acetaminophen/Tylenol (same medication) DO NOT take more Acetaminophen than below because it can be bad for your liver. 500 mg. tablets:  2 tablets every 8 hours, as needed for pain.  Do not take more than 6, (500 mg) tablets every day. 300 mg. tablets:  2 tablets every 6 hours,  as needed for pain.  Do not take more than 8 (325 mg) tablets every day. Look for Acetaminophen/Tylenol in other medicines you buy over the counter.   Still in Pain There ae a lot of different kinds of pain medicines:  creams, patches and supplements. Ask your Healthcare Provider about other pain medications.   Stop Smoking Smoking can make arthritis worse.   Stop Pain Reviewed 10/14/2022 Before it gets bad. Once in pain It is harder to get rid of. Begin pain relief while you have mild pain.   Other   Other    ;  PRACTICE It is important to practice the strategies so we can determine if they will be effective in helping to reach the goal.    Follow these specific recommendations:        If strategy does not work the first time, try it again.  We may make some changes over the next few sessions.    We may make some changes over the next few sessions, based on how they work.  [name] RN, BSN, CEN RN Case Production designer, theatre/television/film for [employer] [facility] Mobile: [phone]   12/12/2022 Assessment:  patient complaining of pain all over. Reports she was having trouble sleeping last night due to pain.  9/10 today. Reports taking all her medications as prescribed. States she has been trying to walk up the stairs with the new hand rail but has not made it up the stairs yet.  Reports CBG today of 180.   Interventions: reviewed pain and what patient is doing for pain. Reviewed and provided written home exercise plan. Demonstrated exercises. Patient not able to perform exercises due to pain as reported.  Encouraged patient to do her exercises daily.  Reviewed with patient that OT has an appointment scheduled for later today. Also reviewed with the patient that [facility] care guide has been trying to schedule appointments for RN CM. I scheduled appointment with RNCM Juanell Fairly for 12/16/2022. Patient voiced understanding.   Plan: next home visit planned for 01/13/2023  [name]  RN, BSN, CEN RN Case Manager for [employer] [facility] Mobile: [phone]   01/13/2023 Assessment: Patient forgot appointment. Ambulating well to the bathroom. Patient reports being active with [facility] RN and PT. Reports she is doing her exercises.  Interventions:  Reviewed importance of home exercise plan and offered encouragement.  Encouraged patient to talk about her concerns with her assigned RNCM and Child psychotherapist. Reviewed with patient interventions provided by [facility] and Social worker and suggested patient complete recommendations. Offered to assist patient with new CBG meter and she declined my assistance. Reviewed pending appointments. Encouraged patient to follow MD recommendations for pain control.   Plan: Next home visit planned for 02/10/2023  Lonia Chimera, RN, BSN, CEN [facility] Care Coordinator [phone]    02/27/23  Assessment: Mrs. Terzian continues to have ongoing pain. States pain has decreased and is better than what it was several months ago. States she has good days and bad days. States with her recent respiratory infection, she has been unable to do her exercises as she was before. Endorses she has misplaced her AG exercise booklet. Reports [facility] home health comes out for therapy and she has not been able to participate as much due to not feeling well. Reports she uses heat, Tylenol, muscle rub, and icy hot to help alleviate pain. States she also prays during painful episodes. Also reports she needs to be back on her Seroquel for depression and for sleep.   Interventions: Provided another Aging Gracefully exercise book. Encouraged Mrs. Dower to do exercises as she starts to feel better from her respiratory infection. Provided several face masks for her use. Also provided a pill box per Mrs. Flagler's request. Provided acting listening and encouragement as Mrs. Zetina discussed the loss of her son and grandson recently. Praised Mrs. Azam for  seeking grief counseling. Discussed this was AG RN last home visit and that OT will continue and [employer] will be in contact for ongoing home repairs and modifications. Encouraged Mrs. Edgington to also inquire about assistance thru her church.  Plan: Will re- refer to CCM LCSW for community resources. Will send message to [employer] about heating/air issues. Will leave message for PCP office regarding Mrs. Kitko's desire to restart Seroquel for her depression, sleep, and anxiety.  Raiford Noble, MSN, RN, BSN RN Case Production designer, theatre/television/film for Aging Brewing technologist Health  [facility] Direct Dial: [phone]

## 2023-02-27 NOTE — Patient Outreach (Signed)
Aging Gracefully Program  RN Visit  02/27/2023  [name] [name] Oct 06, 1950 [id number]  Visit:  RN Visit Number: 4- Fourth Visit  RN TIME CALCULATION: Start TIme:  RN Start Time Calculation: 1300 End Time:  RN Stop Time Calculation: 1442 Total Minutes:  RN Time Calculation: 102  Readiness To Change Score:  Readiness to Change Score: 10  Universal RN Interventions: Calendar Distribution: Yes Exercise Review: Yes Medications: Yes Medication Changes: Yes Mood: Yes Pain: Yes PCP Advocacy/Support: Yes Fall Prevention: Yes Incontinence: Yes Clinician View Of Client Situation: Arrived to client's home. Noted front door ramp completed. Mrs. [name] [name] answered the door slowly after several knocks and door bell rings.Ambulates with rollator. Window blinds closed. Therefore somewhat dark inside home. Great grandson in living room occupied with game. Mrs. [name] [name] pleasant and engaging, Noted cough. Client View Of His/Her Situation: Mrs. [name] [name] reports she has been sick for several weeks. Reports having a cough and was recently placed on antibiotics. Denies having covid. Also reports diarrhea that has worsened since starting abx. Reports ongoing pain.Uses muscle rub, icy hot, and heat for pain relief. Reports she has been unable to sleep as of late due to depression and anxiety. Endorses she has lost both her son and grandson within the past 9 months. Goes to grief counseling thru her church. States she used to be on Seroquel. States needs to be on it again. Reports her heating unit is not working. States she turns on the oven to warm up the home. States she plans to ask [facility] about assistance. States heat has been out for several months. Also reports she has gone into debt with purchasing window units for air conditioner. Endorses bannisters have also been installed and she is waiting for kitchen floor, bathroom, ceiling, and back ramp work to begin.  Healthcare Provider Communication: Did Primary school teacher With [employer] Provider?: No According to Client, Did PCP Report Communication With An Aging Gracefully RN?: No  Clinician View of Client Situation: Clinician View Of Client Situation: Arrived to client's home. Noted front door ramp completed. Mrs. [name] [name] answered the door slowly after several knocks and door bell rings.Ambulates with rollator. Window blinds closed. Therefore somewhat dark inside home. Great grandson in living room occupied with game. Mrs. Leala pleasant and engaging, Noted cough. Client's View of His/Her Situation: Client View Of His/Her Situation: Mrs. [name] [name] reports she has been sick for several weeks. Reports having a cough and was recently placed on antibiotics. Denies having covid. Also reports diarrhea that has worsened since starting abx. Reports ongoing pain.Uses muscle rub, icy hot, and heat for pain relief. Reports she has been unable to sleep as of late due to depression and anxiety. Endorses she has lost both her son and grandson within the past 9 months. Goes to grief counseling thru her church. States she used to be on Seroquel. States needs to be on it again. Reports her heating unit is not working. States she turns on the oven to warm up the home. States she plans to ask [facility] about assistance. States heat has been out for several months. Also reports she has gone into debt with purchasing window units for air conditioner. Endorses bannisters have also been installed and she is waiting for kitchen floor, bathroom, ceiling, and back ramp work to begin.  Medication Assessment: Outpatient Encounter Medications as of 02/27/2023  Medication Sig Note   acetaminophen (TYLENOL) 325 MG tablet Take 2 tablets (650 mg total) by mouth every 4 (four) hours as needed for mild  pain (or Fever >/= 101).    albuterol (PROVENTIL) (2.5 MG/3ML) 0.083% nebulizer solution Take 3 mLs (2.5 mg total) by nebulization every 6 (six) hours as needed for wheezing or shortness of  breath.    albuterol (VENTOLIN HFA) 108 (90 Base) MCG/ACT inhaler Inhale 2 puffs into the lungs every 6 (six) hours as needed for wheezing or shortness of breath.    amLODipine (NORVASC) 10 MG tablet TAKE 1 TABLET(10 MG) BY MOUTH DAILY    amoxicillin-clavulanate (AUGMENTIN) 875-125 MG tablet Take 1 tablet by mouth 2 (two) times daily for 7 days.    aspirin EC 81 MG tablet Take 81 mg by mouth daily. 10/24/2021: Self discontinued   BD PEN NEEDLE NANO 2ND GEN 32G X 4 MM MISC USE AS NEEDED    Blood Glucose Monitoring Suppl (ACCU-CHEK GUIDE) w/Device KIT 1 Device by Does not apply route 4 (four) times daily.    carvedilol (COREG) 25 MG tablet Take 1 tablet (25 mg total) by mouth 2 (two) times daily with a meal.    cetirizine (ZYRTEC) 10 MG tablet Take 1 tablet (10 mg total) by mouth daily.    clobetasol ointment (TEMOVATE) 0.05 % Apply 1 application  topically 2 (two) times daily. Apply under nails    diclofenac Sodium (VOLTAREN) 1 % GEL Apply 4 g topically 4 (four) times daily as needed.    flunisolide (NASALIDE) 25 MCG/ACT (0.025%) SOLN Place 2 sprays into the nose 2 (two) times daily.    gabapentin (NEURONTIN) 300 MG capsule TAKE 2 CAPSULES BY MOUTH 3 TIMES DAILY    glucose blood (ACCU-CHEK GUIDE) test strip Use as instructed to test sugars 3 x daily Dx code: E11.42    hydrOXYzine (ATARAX) 25 MG tablet Take 1 tablet (25 mg total) by mouth 3 (three) times daily as needed.    insulin glargine (LANTUS SOLOSTAR) 100 UNIT/ML Solostar Pen Inject 18 Units into the skin daily.    Lancet Devices (ONE TOUCH DELICA LANCING DEV) MISC 1 Device by Does not apply route 4 (four) times daily.    metoCLOPramide (REGLAN) 5 MG tablet Take 1 tablet (5 mg total) by mouth 3 (three) times daily before meals.    nystatin (MYCOSTATIN) 100000 UNIT/ML suspension Take 2 mLs (200,000 Units total) by mouth 4 (four) times daily. Apply 1mL to each cheek    nystatin (MYCOSTATIN/NYSTOP) powder Apply topically 3 (three) times daily. To  affected area    ondansetron (ZOFRAN) 4 MG tablet Take 1 tablet every morning and repeat in 6-8 hours    oxybutynin (DITROPAN-XL) 10 MG 24 hr tablet Take 10 mg by mouth daily.    pantoprazole (PROTONIX) 40 MG tablet Take 1 tablet (40 mg total) by mouth 2 (two) times daily.    polyethylene glycol (MIRALAX) 17 g packet Take 17 g by mouth 2 (two) times daily. Increase as needed    rosuvastatin (CRESTOR) 40 MG tablet TAKE 1 TABLET(40 MG) BY MOUTH DAILY    Semaglutide,0.25 or 0.5MG /DOS, 2 MG/1.5ML SOPN Inject 0.5 mg into the skin once a week. 0.25 mg once weekly for 4 weeks then increase to 0.5 mg weekly for at least 4 weeks,max 1 mg    TRUEplus Lancets 33G MISC TEST BLOOD SUGAR 3 TO 4 TIMES DAILY    budesonide (PULMICORT) 0.25 MG/2ML nebulizer solution Take 2 mLs (0.25 mg total) by nebulization 2 (two) times daily. (Patient not taking: Reported on 02/27/2023)    budesonide-formoterol (SYMBICORT) 80-4.5 MCG/ACT inhaler Inhale 2 puffs into the lungs 2 (  two) times daily. (Patient not taking: Reported on 10/14/2022) 09/30/2021: Sinuses draining    Continuous Blood Gluc Sensor (FREESTYLE LIBRE 2 SENSOR) MISC Apply 1 sensor every 14 days. (Patient not taking: Reported on 10/14/2022)    dextromethorphan-guaiFENesin (MUCINEX DM) 30-600 MG 12hr tablet Take 1 tablet by mouth 2 (two) times daily. (Patient not taking: Reported on 10/14/2022)    dicyclomine (BENTYL) 10 MG capsule Take 1 capsule (10 mg total) by mouth every 8 (eight) hours as needed for spasms. (Patient not taking: Reported on 02/27/2023)    docusate sodium (COLACE) 100 MG capsule Take 100 mg by mouth 2 (two) times daily as needed for moderate constipation. (Patient not taking: Reported on 10/14/2022)    ipratropium (ATROVENT) 0.03 % nasal spray Place 2 sprays into both nostrils 3 (three) times daily. (Patient not taking: Reported on 10/14/2022)    linaclotide (LINZESS) 145 MCG CAPS capsule Take 1 capsule (145 mcg total) by mouth daily before breakfast.  (Patient not taking: Reported on 10/14/2022)    losartan (COZAAR) 25 MG tablet Take 1 tablet (25 mg total) by mouth at bedtime. (Patient not taking: Reported on 10/14/2022)    mirtazapine (REMERON) 7.5 MG tablet Take 1 tablet (7.5 mg total) by mouth at bedtime. (Patient not taking: Reported on 02/27/2023)    terbinafine (LAMISIL) 1 % cream Apply 1 Application topically 2 (two) times daily. Apply under belly (Patient not taking: Reported on 02/27/2023)    triamcinolone ointment (KENALOG) 0.1 % Apply 1 Application topically 2 (two) times daily. (Patient not taking: Reported on 02/27/2023)    No facility-administered encounter medications on file as of 02/27/2023.       OT Update: pending [employer] completion   Session Summary: Mrs. Chachere expressed appreciation of the program and the work that has been done for her. Reports she will start to work on home exercises again as she starts to feel better. At the end of AG RN visit, Mrs. Bartosik sat outside on the ramp, on her rollator. Endorses sitting outside on her porch feels good.Also reports she will contact pharmacy for refills for her albuterol nebulizer medication and inhaler.    Goals Addressed               This Visit's Progress     AG RN (pt-stated)        Goals:  Patient will report decrease in pain in the next 160 days.   10/14/2022 Assessment:   Patient with chronic pain in her back due to injury.  Patient with limited ability to move about in her home due to inability to climb steps.  (Hand railing only on one side of the staircase)  Patient reports she takes her medication for pain ( gabapentin) and uses a heating pad. Interventions:  Reviewed use of ice and or heat.  Reviewed pain control. Reviewed ability to do exercise. Provided a [facility] calendar and reviewed the tool. Wrote all pending appointments in calendar. Encouraged patient to call MD for an appointment ( way past due). Encouraged patient to get prescriptions  filled. Reviewed with patient to call the [facility] Customer service number to get assistance with a new CBG meter.  Plan: next home visit planned for 11/11/2022  CLIENT/RN ACTION PLAN - PAIN  Registered Nurse:  [name] RN   Date:    10/14/2022  Client Name:  [name] Client ID:    Target Area:  PAIN   Why Problem May Occur: Previous back injury   Target Goal:  Patient will report decrease in back pain  in the next 160 days.   STRATEGIES Coping Strategies Ideas:  Heat  Reviewed 10/14/2022 Use heating pad or warm towel on painful area no more than 20 minutes at a time. Don't sleep with a heating pad on - it could burn your skin   Ice  Reviewed 10/14/2022 Use ice pack or frozen bag of vegetables on painful area.   Leave cold pack on for less than 20 minutes. Ice can burn your skin.  Don't leave ice on longer than 20 minutes.   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 Turn off the TV and turn on the radio.   Prayer/Meditation Prayer and meditation can decrease pain   Other   Acetaminophen/Tylenol (same medication) DO NOT take more Acetaminophen than below because it can be bad for your liver. 500 mg. tablets:  2 tablets every 8 hours, as needed for pain.  Do not take more than 6, (500 mg) tablets every day. 300 mg. tablets:  2 tablets every 6 hours, as needed for pain.  Do not take more than 8 (325 mg) tablets every day. Look for Acetaminophen/Tylenol in other medicines you buy over the counter.   Still in Pain There ae a lot of different kinds of pain medicines:  creams, patches and supplements. Ask your Healthcare Provider about other pain medications.   Stop Smoking Smoking can make arthritis worse.   Stop  Pain Reviewed 10/14/2022 Before it gets bad. Once in pain It is harder to get rid of. Begin pain relief while you have mild pain.   Other   Other    ;  PRACTICE It is important to practice the strategies so we can determine if they will be effective in helping to reach the goal.    Follow these specific recommendations:        If strategy does not work the first time, try it again.  We may make some changes over the next few sessions.    We may make some changes over the next few sessions, based on how they work.   [name] RN, BSN, CEN RN Case Production designer, theatre/television/film for [employer] [facility] Mobile: [phone]   12/12/2022 Assessment:  patient complaining of pain all over. Reports she was having trouble sleeping last night due to pain.  9/10 today. Reports taking all her medications as prescribed. States she has been trying to walk up the stairs with the new hand rail but has not made it up the stairs yet.  Reports CBG today of 180.   Interventions: reviewed pain and what patient is doing for pain. Reviewed and provided written home exercise plan. Demonstrated exercises. Patient not able to perform exercises due to pain as reported.  Encouraged patient to do her exercises daily.  Reviewed with patient that OT has an appointment scheduled for later today. Also reviewed with the patient that [facility] care guide has been trying to schedule appointments for RN CM. I scheduled appointment with RNCM Juanell Fairly for 12/16/2022. Patient voiced understanding.   Plan: next home visit planned for 01/13/2023  [name] RN, BSN, CEN RN Case Manager for [employer] [facility] Mobile: [phone]   01/13/2023 Assessment: Patient forgot appointment. Ambulating well  to the bathroom. Patient reports being active with [facility] RN and PT. Reports she is doing her exercises.  Interventions:  Reviewed importance of home exercise plan and offered encouragement.   Encouraged patient to talk about her concerns with her assigned RNCM and Child psychotherapist. Reviewed with patient interventions provided by [facility] and Social worker and suggested patient complete recommendations. Offered to assist patient with new CBG meter and she declined my assistance. Reviewed pending appointments. Encouraged patient to follow MD recommendations for pain control.   Plan: Next home visit planned for 02/10/2023  Lonia Chimera, RN, BSN, CEN [facility] Community Care Coordinator [phone]    02/27/23  Assessment: Mrs. Dore continues to have ongoing pain. States pain has decreased and is better than what it was several months ago. States she has good days and bad days. States with her recent respiratory infection, she has been unable to do her exercises as she was before. Endorses she has misplaced her AG exercise booklet. Reports [facility] home health comes out for therapy and she has not been able to participate as much due to not feeling well. Reports she uses heat, Tylenol, muscle rub, and icy hot to help alleviate pain. States she also prays during painful episodes. Also reports she needs to be back on her Seroquel for depression and for sleep.   Interventions: Provided another Aging Gracefully exercise book. Encouraged Mrs. Falkenstein to do exercises as she starts to feel better from her respiratory infection. Provided several face masks for her use. Also provided a pill box per Mrs. Corcino's request. Provided acting listening and encouragement as Mrs. Mika discussed the loss of her son and grandson recently. Praised Mrs. Osentoski for seeking grief counseling. Discussed this was AG RN last home visit and that OT will continue and [employer] will be in contact for ongoing home repairs and modifications. Encouraged Mrs. Paschen to also inquire about assistance thru her church.  Plan: Will re- refer to CCM LCSW for community resources. Will send message to [employer] about heating/air issues. Will leave message for PCP office regarding Mrs. Schamp's desire to restart Seroquel for her depression, sleep, and anxiety.  Raiford Noble, MSN, RN, BSN RN Case Production designer, theatre/television/film for Aging [employer] technologist Health  [facility] Direct Dial: [phone]                                   Raiford Noble, MSN, RN, BSN RN Case Manager for Aging Gracefully [facility] Health  [facility] Direct Dial: [phone]

## 2023-02-27 NOTE — Patient Instructions (Signed)
Visit Information  Thank you for taking time to visit with me today. Please don't hesitate to contact me if I can be of assistance to you before our next scheduled home appointment.  Following are the goals we discussed today:   Goals Addressed               This Visit's Progress     COMPLETED: AG RN (pt-stated)        Goals:  Patient will report decrease in pain in the next 160 days.   10/14/2022 Assessment:   Patient with chronic pain in her back due to injury.  Patient with limited ability to move about in her home due to inability to climb steps.  (Hand railing only on one side of the staircase)  Patient reports she takes her medication for pain ( gabapentin) and uses a heating pad. Interventions:  Reviewed use of ice and or heat.  Reviewed pain control. Reviewed ability to do exercise. Provided a [facility] calendar and reviewed the tool. Wrote all pending appointments in calendar. Encouraged patient to call MD for an appointment ( way past due). Encouraged patient to get prescriptions filled. Reviewed with patient to call the [facility] Customer service number to get assistance with a new CBG meter.  Plan: next home visit planned for 11/11/2022  CLIENT/RN ACTION PLAN - PAIN  Registered Nurse:  [name] RN   Date:    10/14/2022  Client Name:  [name] [name] Client ID:    Target Area:  PAIN   Why Problem May Occur: Previous back injury   Target Goal:  Patient will report decrease in back pain  in the next 160 days.   STRATEGIES Coping Strategies Ideas:  Heat  Reviewed 10/14/2022 Reviewed 02/27/2023 Use heating pad or warm towel on painful area no more than 20 minutes at a time. Don't sleep with a heating pad on - it could burn your skin   Ice  Reviewed 10/14/2022  N/A 02/27/23 Use ice pack or frozen bag of vegetables on painful area.   Leave cold pack on for less than 20 minutes. Ice can burn your skin.  Don't leave ice on longer than 20 minutes.   Activity and  Exercise  Will plan to review at next home visit on 11/11/2022  Reviewed 02/27/23 Joints get stiff when not in use Aging Gracefully Exercises Walking (inside or outside) [employer]:  cooking, cleaning, Building surveyor   Listen to Music Listening to music can decrease pain. Turn off the TV and turn on the radio.   Prayer/Meditation  Reviewed 02/27/23 Prayer and meditation can decrease pain   Other   Acetaminophen/Tylenol (same medication) DO NOT take more Acetaminophen than below because it can be bad for your liver. 500 mg. tablets:  2 tablets every 8 hours, as needed for pain.  Do not take more than 6, (500 mg) tablets every day. 300 mg. tablets:  2 tablets every 6 hours, as needed for pain.  Do not take more than 8 (325 mg) tablets every day. Look for Acetaminophen/Tylenol in other medicines you buy over the counter.   Still in Pain There ae a lot of different kinds of pain medicines:  creams, patches and supplements. Ask your Healthcare Provider about other pain medications.   Stop Smoking Smoking can make arthritis worse.   Stop Pain Reviewed 10/14/2022  Reviewed 02/27/23 Before it gets bad. Once in pain It is harder to get rid of. Begin pain relief while you have  mild pain.   Other   Other    ;  PRACTICE It is important to practice the strategies so we can determine if they will be effective in helping to reach the goal.    Follow these specific recommendations:        If strategy does not work the first time, try it again.  We may make some changes over the next few sessions.    We may make some changes over the next few sessions, based on how they work.   [name] RN, BSN, CEN RN Case Production designer, theatre/television/film for [employer] [facility] Mobile: [phone]   12/12/2022 Assessment:  patient complaining of pain all over. Reports she was having trouble sleeping last night due to pain.  9/10 today. Reports taking all her medications  as prescribed. States she has been trying to walk up the stairs with the new hand rail but has not made it up the stairs yet.  Reports CBG today of 180.   Interventions: reviewed pain and what patient is doing for pain. Reviewed and provided written home exercise plan. Demonstrated exercises. Patient not able to perform exercises due to pain as reported.  Encouraged patient to do her exercises daily.  Reviewed with patient that OT has an appointment scheduled for later today. Also reviewed with the patient that [facility] care guide has been trying to schedule appointments for RN CM. I scheduled appointment with RNCM Juanell Fairly for 12/16/2022. Patient voiced understanding.   Plan: next home visit planned for 01/13/2023  [name] RN, BSN, CEN RN Case Manager for [employer] [facility] Mobile: [phone]   01/13/2023 Assessment: Patient forgot appointment. Ambulating well to the bathroom. Patient reports being active with [facility] RN and PT. Reports she is doing her exercises.  Interventions:  Reviewed importance of home exercise plan and offered encouragement.  Encouraged patient to talk about her concerns with her assigned RNCM and Child psychotherapist. Reviewed with patient interventions provided by [facility] and Social worker and suggested patient complete recommendations. Offered to assist patient with new CBG meter and she declined my assistance. Reviewed pending appointments. Encouraged patient to follow MD recommendations for pain control.   Plan: Next home visit planned for 02/10/2023  Lonia Chimera, RN, BSN, CEN [facility] Community Care Coordinator [phone]    02/27/23  Assessment: [name] continues to have ongoing pain. States pain has decreased and is better than what it was several months ago. States she has good days and bad days. States with her recent respiratory infection, she has been unable to do her exercises as she was before. Endorses she has misplaced her AG  exercise booklet. Reports [facility] comes out for therapy and she has not been able to participate as much due to not feeling well. Reports she uses heat, Tylenol, muscle rub, and icy hot to help alleviate pain. States she also prays during painful episodes. Also reports she needs to be back on her Seroquel for depression and for sleep.   Interventions: Provided another Aging Gracefully exercise book. Encouraged Mrs. Riggle to do exercises as she starts to feel better from her respiratory infection. Provided several face masks for her use. Also provided a pill box per Mrs. Vanliew's request. Provided acting listening and encouragement as Mrs. Garciamartinez discussed the loss of her son and grandson recently. Praised Mrs. Spikes for seeking grief counseling. Discussed this was AG Charity fundraiser last home visit and that OT will continue and [employer] will be  in contact for ongoing home repairs and modifications. Encouraged Mrs. Heimsoth to also inquire about assistance thru her church. Encouraged Mrs. Nussbaumer to sit outside more on the front porch while the weather is so pretty. Discussed how important the sun is for Vitamin D.  Plan: Will re- refer to CCM LCSW for community resources. Will send message to [employer] about heating/air issues. Will leave message for PCP office regarding Mrs. Gerke's desire to restart Seroquel for her depression, sleep, and anxiety.  Raiford Noble, MSN, RN, BSN RN Case Manager for Aging Gracefully [place]  [facility] Direct Dial: [phone]                                       If you are experiencing a Mental Health or Behavioral Health Crisis or need someone to talk to, please call the Suicide and Crisis Lifeline: 988 call the [place] National Suicide Prevention Lifeline: 2728588396 or TTY: 470-077-5432 TTY 731-018-1647) to talk to a trained counselor call 1-800-273-TALK (toll free, 24 hour  hotline) go to [facility] [address] [place] [phone]) call 911   The patient verbalized understanding of instructions, educational materials, and care plan provided today and agreed to receive a mailed copy of patient instructions, educational materials, and care plan.   Raiford Noble, MSN, RN, BSN RN Case Production designer, theatre/television/film for Aging Brewing technologist Health  [facility] Direct Dial: [phone]

## 2023-03-02 ENCOUNTER — Telehealth: Payer: Self-pay

## 2023-03-02 ENCOUNTER — Other Ambulatory Visit: Payer: Self-pay | Admitting: *Deleted

## 2023-03-02 ENCOUNTER — Telehealth: Payer: Self-pay | Admitting: *Deleted

## 2023-03-02 DIAGNOSIS — F411 Generalized anxiety disorder: Secondary | ICD-10-CM

## 2023-03-02 NOTE — Progress Notes (Unsigned)
  Care Coordination  Outreach Note  03/02/2023 Name: Margretta Goucher MRN: [id number] DOB: 06/06/1950   Care Coordination Outreach Attempts: An unsuccessful telephone outreach was attempted today to offer the patient information about available care coordination services.  Follow Up Plan:  Additional outreach attempts will be made to offer the patient care coordination information and services.   Encounter Outcome:  No Answer  Gwenevere Ghazi  Care Coordination Care Guide  Direct Dial: [phone]

## 2023-03-02 NOTE — Telephone Encounter (Signed)
Raiford Noble RN with Aging Gracefully program calls nurse line in regards to Seroquel.   She reports she did a visit with the patient on 11/8 who noted mood changes and difficulty sleeping.  The patient was on Seroquel, however has not been on the medication in sometime.   The patient has a FU apt with PCP for later this month. Can address mood changes and sleep difficulty then.

## 2023-03-02 NOTE — Patient Outreach (Signed)
Aging Gracefully Program  03/02/2023  Delijah Cashman 01-11-1951 [id number]  Writer contacted PCP office. Spoke with Idalia Needle to make aware Mrs. Meeka mentioned that she wants to be restarted on her Seroquel to help with her anxiety, depression, and trouble sleeping. Idalia Needle states Mrs. Maximina has an upcoming appointment that she can discuss with PCP at that time. Writer asked if this call could please be noted in her chart. Writer will also send message to Dr.  Barb Merino for awareness as Mrs. Cozens reports she was once on Seroquel and it was discontinued.   Will also message CCM team to make aware of writer's visit and Mrs. Genoveva's concerns.   Raiford Noble, MSN, RN, BSN RN Case Production designer, theatre/television/film for Aging Brewing technologist Health  [facility] Direct Dial: [phone]

## 2023-03-03 ENCOUNTER — Other Ambulatory Visit: Payer: Self-pay | Admitting: *Deleted

## 2023-03-03 NOTE — Patient Outreach (Signed)
Aging Gracefully Program  03/03/2023  Emojean Wineberg [birth year] [id number]   Telephone call made to [name]. Made her aware that [employer] does not repair heating/air units that are serviced by [employer]. Provided contact information that was provided by Misty Stanley, with [employer] for [employer] [phone]) [phone] or [phone].  Their website is [email or web address]. Advised [name] to contact this agency for possible assistance.   Also made [name] aware that Clinical research associate contacted her PCP about her wanting to restart Seroquel. Also encouraged [name] to contact CCM team to schedule appointment with SW.  [name] expressed appreciation of the call.   No further AG RN needs identified.   Raiford Noble, MSN, RN, BSN RN Case Production designer, theatre/television/film for Aging Brewing technologist Health  [facility] Direct Dial: [phone]

## 2023-03-03 NOTE — Progress Notes (Unsigned)
  Care Coordination Note  03/03/2023 Name: [name] [name] MRN: [id number] DOB: 12/06/1950  [name] [name] is a 72 y.o. year old female who is a primary care patient of [name], MD and is actively engaged with the care management team. I reached out to [facility] by phone today to assist with scheduling an initial visit with the Licensed Clinical Social Worker  Follow up plan: Unsuccessful telephone outreach attempt made. A HIPAA compliant phone message was left for the patient providing contact information and requesting a return call.   [facility]  Care Coordination Care Guide  Direct Dial: [phone]

## 2023-03-04 ENCOUNTER — Ambulatory Visit: Payer: Medicare HMO | Admitting: Pharmacist

## 2023-03-04 DIAGNOSIS — E1142 Type 2 diabetes mellitus with diabetic polyneuropathy: Secondary | ICD-10-CM | POA: Diagnosis not present

## 2023-03-04 DIAGNOSIS — I2510 Atherosclerotic heart disease of native coronary artery without angina pectoris: Secondary | ICD-10-CM | POA: Diagnosis not present

## 2023-03-04 DIAGNOSIS — J4489 Other specified chronic obstructive pulmonary disease: Secondary | ICD-10-CM | POA: Diagnosis not present

## 2023-03-04 DIAGNOSIS — M5417 Radiculopathy, lumbosacral region: Secondary | ICD-10-CM | POA: Diagnosis not present

## 2023-03-04 DIAGNOSIS — E1169 Type 2 diabetes mellitus with other specified complication: Secondary | ICD-10-CM | POA: Diagnosis not present

## 2023-03-04 DIAGNOSIS — I152 Hypertension secondary to endocrine disorders: Secondary | ICD-10-CM | POA: Diagnosis not present

## 2023-03-04 DIAGNOSIS — M4807 Spinal stenosis, lumbosacral region: Secondary | ICD-10-CM | POA: Diagnosis not present

## 2023-03-04 DIAGNOSIS — E1159 Type 2 diabetes mellitus with other circulatory complications: Secondary | ICD-10-CM | POA: Diagnosis not present

## 2023-03-04 DIAGNOSIS — I5030 Unspecified diastolic (congestive) heart failure: Secondary | ICD-10-CM | POA: Diagnosis not present

## 2023-03-05 DIAGNOSIS — I2510 Atherosclerotic heart disease of native coronary artery without angina pectoris: Secondary | ICD-10-CM | POA: Diagnosis not present

## 2023-03-05 DIAGNOSIS — I5030 Unspecified diastolic (congestive) heart failure: Secondary | ICD-10-CM | POA: Diagnosis not present

## 2023-03-05 DIAGNOSIS — E1142 Type 2 diabetes mellitus with diabetic polyneuropathy: Secondary | ICD-10-CM | POA: Diagnosis not present

## 2023-03-05 DIAGNOSIS — M5417 Radiculopathy, lumbosacral region: Secondary | ICD-10-CM | POA: Diagnosis not present

## 2023-03-05 DIAGNOSIS — M4807 Spinal stenosis, lumbosacral region: Secondary | ICD-10-CM | POA: Diagnosis not present

## 2023-03-05 DIAGNOSIS — J4489 Other specified chronic obstructive pulmonary disease: Secondary | ICD-10-CM | POA: Diagnosis not present

## 2023-03-05 DIAGNOSIS — E1159 Type 2 diabetes mellitus with other circulatory complications: Secondary | ICD-10-CM | POA: Diagnosis not present

## 2023-03-05 DIAGNOSIS — I152 Hypertension secondary to endocrine disorders: Secondary | ICD-10-CM | POA: Diagnosis not present

## 2023-03-05 DIAGNOSIS — E1169 Type 2 diabetes mellitus with other specified complication: Secondary | ICD-10-CM | POA: Diagnosis not present

## 2023-03-05 NOTE — Progress Notes (Signed)
  Care Coordination  Outreach Note  03/05/2023 Name: [name] [name] MRN: [id number] DOB: 02-15-1951   Care Coordination Outreach Attempts: A third unsuccessful outreach was attempted today to offer the patient with information about available care coordination services.  Follow Up Plan:  No further outreach attempts will be made at this time. We have been unable to contact the patient to offer or enroll patient in care coordination services  Encounter Outcome:  No Answer  Gwenevere Ghazi  Care Coordination Care Guide  Direct Dial: [phone]

## 2023-03-05 NOTE — Progress Notes (Signed)
  Care Coordination Note  03/05/2023 Name: [name] [name] MRN: [id number] DOB: [birth year]  [name] is a 72 y.o. year old female who is a primary care patient of [name], MD and is actively engaged with the care management team. I reached out to [facility] by phone today to assist with scheduling an initial visit with the Licensed Clinical Social Worker and follow up with RNCM   Follow up plan: Telephone appointment with care management team member scheduled for:SW 11/15 and [facility] 11/18  [facility] Coordination Care Guide  Direct Dial: [phone]

## 2023-03-06 ENCOUNTER — Other Ambulatory Visit: Payer: Self-pay | Admitting: *Deleted

## 2023-03-06 ENCOUNTER — Telehealth: Payer: Self-pay | Admitting: *Deleted

## 2023-03-06 DIAGNOSIS — M4807 Spinal stenosis, lumbosacral region: Secondary | ICD-10-CM | POA: Diagnosis not present

## 2023-03-06 DIAGNOSIS — I152 Hypertension secondary to endocrine disorders: Secondary | ICD-10-CM | POA: Diagnosis not present

## 2023-03-06 DIAGNOSIS — J4489 Other specified chronic obstructive pulmonary disease: Secondary | ICD-10-CM | POA: Diagnosis not present

## 2023-03-06 DIAGNOSIS — I5030 Unspecified diastolic (congestive) heart failure: Secondary | ICD-10-CM | POA: Diagnosis not present

## 2023-03-06 DIAGNOSIS — E1142 Type 2 diabetes mellitus with diabetic polyneuropathy: Secondary | ICD-10-CM | POA: Diagnosis not present

## 2023-03-06 DIAGNOSIS — E1169 Type 2 diabetes mellitus with other specified complication: Secondary | ICD-10-CM | POA: Diagnosis not present

## 2023-03-06 DIAGNOSIS — I2510 Atherosclerotic heart disease of native coronary artery without angina pectoris: Secondary | ICD-10-CM | POA: Diagnosis not present

## 2023-03-06 DIAGNOSIS — E1159 Type 2 diabetes mellitus with other circulatory complications: Secondary | ICD-10-CM | POA: Diagnosis not present

## 2023-03-06 DIAGNOSIS — Z599 Problem related to housing and economic circumstances, unspecified: Secondary | ICD-10-CM

## 2023-03-06 DIAGNOSIS — M5417 Radiculopathy, lumbosacral region: Secondary | ICD-10-CM | POA: Diagnosis not present

## 2023-03-06 NOTE — Patient Outreach (Signed)
Care Management   Visit Note  03/06/2023 Name: [name] [name] MRN: [id number] DOB: Aug 24, 1950  Subjective: Sahniya Henneman is a 72 y.o. year old female who is a primary care patient of [name], MD. The Care Management team was consulted for assistance.      Engaged with patient spoke with patient by telephone.    Goals Addressed             This Visit's Progress    RNCM Care Management Expected Outcome: Monitor, Self-Manage and Reduce Symptoms of: DM, HTN       Current Barriers:  Knowledge Deficits related to plan of care for management of HTN and DMII  Care Coordination needs related to Financial constraints related to home assistive device-BP cuff & Utilities Chronic Disease Management support and education needs related to HTN and DMII  Financial Constraints   Home Health RN, Lamar Laundry from [facility] present during patient outreach and provided vital signs. BP 160/88 HR 83, O2 sat 99%, Respirations 16, Temp 97.4. No current weight. Patient reported that she had not checked her sugar recently and her last check it was 123. RNCM requested patient to check her blood sugar during call and [facility] RN assisted patient and her blood sugar was 182.  RNCM Clinical Goal(s):  Patient will verbalize basic understanding of  HTN and DMII disease process and self health management plan as evidenced by verbal explanation, recognizing symptoms, lifestyle modifications, adherence to daily monitoring. take all medications exactly as prescribed and will call provider for medication related questions as evidenced by compliance with medications. demonstrate understanding of rationale for each prescribed medication as evidenced by being able to explain how each medication works, its indications and expected outcomes attend all scheduled medical appointments: with primary care provider and specialist as evidenced by maintaining al scheduled appointments demonstrate Improved and Ongoing adherence to  prescribed treatment plan for HTN and DMII as evidenced by consistent medication compliance, symptom monitoring, continued lifestyle modifications continue to work with RN Care Manager to address care management and care coordination needs related to  HTN and DMII as evidenced by adherence to CM Team Scheduled appointments work with community resource care guide to address needs related to  Financial constraints related to need for home blood pressure cuff and any available program assistance for [employer] as evidenced by patient and/or community resource care guide support through collaboration with Medical illustrator, provider, and care team.   Interventions: Evaluation of current treatment plan related to  self management and patient's adherence to plan as established by provider   Diabetes Interventions:  (Status:  New goal. and Goal on track:  Yes.) Long Term Goal Assessed patient's understanding of A1c goal: <6.5% Provided education to patient about basic DM disease process Reviewed medications with patient and discussed importance of medication adherence Counseled on importance of regular laboratory monitoring as prescribed. Patient does not regularly check her blood sugar. Was provided with [name] but states she is unable to use due to not having a code. Will send pharmacy referral. Discussed plans with patient for ongoing care management follow up and provided patient with direct contact information for care management team Provided patient with written educational materials related to hypo and hyperglycemia and importance of correct treatment Advised patient, providing education and rationale, to check cbg at least twice daily and record, calling primary care provider for findings outside established parameters Referral made to pharmacy team for assistance with [facility]. Patient states she is unable to use machine due to  needing a code. Review of patient status, including review of  consultants reports, relevant laboratory and other test results, and medications completed Screening for signs and symptoms of depression related to chronic disease state  Lab Results  Component Value Date   HGBA1C 8.0 (A) 02/23/2023    Hypertension Interventions:  (Status:  New goal. and Goal on track:  Yes.) Long Term Goal Last practice recorded BP readings:  BP Readings from Last 3 Encounters:  02/23/23 (!) 169/72  01/20/23 (!) 153/70  01/05/23 (!) 187/111   Most recent eGFR/CrCl:  Lab Results  Component Value Date   EGFR 72 11/07/2022    No components found for: "CRCL"  Evaluation of current treatment plan related to hypertension self management and patient's adherence to plan as established by provider. Does not regularly check blood pressure due to not having a cuff. Patient states she is unable to afford BP cuff. RNCM will follow up with care guides. RNCM expressed the importance of obtaining cuff to keep track of blood pressure due to patient being hypertensive. Patient states she has not taken her medication as of yet today. Provided education to patient re: stroke prevention, s/s of heart attack and stroke Reviewed medications with patient and discussed importance of compliance Counseled on adverse effects of illicit drug and excessive alcohol use in patients with high blood pressure  Provided assistance with obtaining home blood pressure monitor via health plan, if available; Counseled on the importance of exercise goals with target of 150 minutes per week Discussed plans with patient for ongoing care management follow up and provided patient with direct contact information for care management team Advised patient, providing education and rationale, to monitor blood pressure daily and record, calling PCP for findings outside established parameters Reviewed scheduled/upcoming provider appointments including:  Discussed complications of poorly controlled blood pressure such as  heart disease, stroke, circulatory complications, vision complications, kidney impairment, sexual dysfunction  Patient Goals/Self-Care Activities: Take all medications as prescribed Attend all scheduled provider appointments Call pharmacy for medication refills 3-7 days in advance of running out of medications Call provider office for new concerns or questions  Work with the social worker to address care coordination needs and will continue to work with the clinical team to address health care and disease management related needs schedule appointment with eye doctor check feet daily for cuts, sores or redness enter blood sugar readings and medication or insulin into daily log take the blood sugar log to all doctor visits check blood pressure daily write blood pressure results in a log or diary take medications for blood pressure exactly as prescribed report new symptoms to your doctor  Follow Up Plan:  Telephone follow up appointment with care management team member scheduled for:  04-10-2023 at 1:00 pm             Consent to Services:  Patient was given information about care management services, agreed to services, and gave verbal consent to participate.   Plan: Telephone follow up appointment with care management team member scheduled for: 04-10-2023 at 1:00 pm  Danise Edge, BSN RN RN Care Manager  [facility]  Ambulatory Care Management  Direct Number: [phone]

## 2023-03-06 NOTE — Patient Outreach (Signed)
  Care Management   Follow Up Note   03/06/2023 Name: [name] [name] MRN: [id number] DOB: Nov 26, 1950   Referred by: [name], MD Reason for referral : Care Management (RNCM: Attempt for patient outreach for chronic disease management & care coordination needs)   An unsuccessful telephone outreach was attempted today. The patient was referred to the case management team for assistance with care management and care coordination.   Follow Up Plan: A HIPPA compliant phone message was left for the patient providing contact information and requesting a return call.    Danise Edge, BSN RN RN Care Manager  [place]  Ambulatory Care Management  Direct Number: [phone]

## 2023-03-06 NOTE — Patient Instructions (Signed)
Visit Information  Thank you for taking time to visit with me today. Please don't hesitate to contact me if I can be of assistance to you before our next scheduled telephone appointment.  Following are the goals we discussed today:   Goals Addressed             This Visit's Progress    RNCM Care Management Expected Outcome: Monitor, Self-Manage and Reduce Symptoms of: DM, HTN       Current Barriers:  Knowledge Deficits related to plan of care for management of HTN and DMII  Care Coordination needs related to Financial constraints related to home assistive device-BP cuff & Utilities Chronic Disease Management support and education needs related to HTN and DMII  Financial Constraints   Home Health RN, Lamar Laundry from [facility] present during patient outreach and provided vital signs. BP 160/88 HR 83, O2 sat 99%, Respirations 16, Temp 97.4. No current weight. Patient reported that she had not checked her sugar recently and her last check it was 123. RNCM requested patient to check her blood sugar during call and [facility] RN assisted patient and her blood sugar was 182.  RNCM Clinical Goal(s):  Patient will verbalize basic understanding of  HTN and DMII disease process and self health management plan as evidenced by verbal explanation, recognizing symptoms, lifestyle modifications, adherence to daily monitoring. take all medications exactly as prescribed and will call provider for medication related questions as evidenced by compliance with medications. demonstrate understanding of rationale for each prescribed medication as evidenced by being able to explain how each medication works, its indications and expected outcomes attend all scheduled medical appointments: with primary care provider and specialist as evidenced by maintaining al scheduled appointments demonstrate Improved and Ongoing adherence to prescribed treatment plan for HTN and DMII as evidenced by consistent medication compliance, symptom  monitoring, continued lifestyle modifications continue to work with RN Care Manager to address care management and care coordination needs related to  HTN and DMII as evidenced by adherence to CM Team Scheduled appointments work with community resource care guide to address needs related to  Financial constraints related to need for home blood pressure cuff and any available program assistance for [employer] as evidenced by patient and/or community resource care guide support through collaboration with Medical illustrator, provider, and care team.   Interventions: Evaluation of current treatment plan related to  self management and patient's adherence to plan as established by provider   Diabetes Interventions:  (Status:  New goal. and Goal on track:  Yes.) Long Term Goal Assessed patient's understanding of A1c goal: <6.5% Provided education to patient about basic DM disease process Reviewed medications with patient and discussed importance of medication adherence Counseled on importance of regular laboratory monitoring as prescribed. Patient does not regularly check her blood sugar. Was provided with [name] but states she is unable to use due to not having a code. Will send pharmacy referral. Discussed plans with patient for ongoing care management follow up and provided patient with direct contact information for care management team Provided patient with written educational materials related to hypo and hyperglycemia and importance of correct treatment Advised patient, providing education and rationale, to check cbg at least twice daily and record, calling primary care provider for findings outside established parameters Referral made to pharmacy team for assistance with [facility]. Patient states she is unable to use machine due to needing a code. Review of patient status, including review of consultants reports, relevant laboratory and other test  results, and medications completed Screening for  signs and symptoms of depression related to chronic disease state  Lab Results  Component Value Date   HGBA1C 8.0 (A) 02/23/2023    Hypertension Interventions:  (Status:  New goal. and Goal on track:  Yes.) Long Term Goal Last practice recorded BP readings:  BP Readings from Last 3 Encounters:  02/23/23 (!) 169/72  01/20/23 (!) 153/70  01/05/23 (!) 187/111   Most recent eGFR/CrCl:  Lab Results  Component Value Date   EGFR 72 11/07/2022    No components found for: "CRCL"  Evaluation of current treatment plan related to hypertension self management and patient's adherence to plan as established by provider. Does not regularly check blood pressure due to not having a cuff. Patient states she is unable to afford BP cuff. RNCM will follow up with care guides. RNCM expressed the importance of obtaining cuff to keep track of blood pressure due to patient being hypertensive. Patient states she has not taken her medication as of yet today. Provided education to patient re: stroke prevention, s/s of heart attack and stroke Reviewed medications with patient and discussed importance of compliance Counseled on adverse effects of illicit drug and excessive alcohol use in patients with high blood pressure  Provided assistance with obtaining home blood pressure monitor via health plan, if available; Counseled on the importance of exercise goals with target of 150 minutes per week Discussed plans with patient for ongoing care management follow up and provided patient with direct contact information for care management team Advised patient, providing education and rationale, to monitor blood pressure daily and record, calling PCP for findings outside established parameters Reviewed scheduled/upcoming provider appointments including:  Discussed complications of poorly controlled blood pressure such as heart disease, stroke, circulatory complications, vision complications, kidney impairment, sexual  dysfunction  Patient Goals/Self-Care Activities: Take all medications as prescribed Attend all scheduled provider appointments Call pharmacy for medication refills 3-7 days in advance of running out of medications Call provider office for new concerns or questions  Work with the social worker to address care coordination needs and will continue to work with the clinical team to address health care and disease management related needs schedule appointment with eye doctor check feet daily for cuts, sores or redness enter blood sugar readings and medication or insulin into daily log take the blood sugar log to all doctor visits check blood pressure daily write blood pressure results in a log or diary take medications for blood pressure exactly as prescribed report new symptoms to your doctor  Follow Up Plan:  Telephone follow up appointment with care management team member scheduled for:  04-10-2023 at 1:00 pm           Our next appointment is by telephone on 04-10-2023 at 1:00 pm  Please call the care guide team at [phone] if you need to cancel or reschedule your appointment.   If you are experiencing a Mental Health or Behavioral Health Crisis or need someone to talk to, please call the Suicide and Crisis Lifeline: 988 call the [place] National Suicide Prevention Lifeline: (904)732-4806 or TTY: 364-448-4955 TTY 619-112-1227) to talk to a trained counselor call 1-800-273-TALK (toll free, 24 hour hotline) go to [facility] [address], [place] [phone]) call 911   The patient verbalized understanding of instructions, educational materials, and care plan provided today and DECLINED offer to receive copy of patient instructions, educational materials, and care plan.   Telephone follow up appointment with  care management team member scheduled for: 04-10-2023 at 1:00 pm  Danise Edge, BSN RN RN Care Manager  [facility]  Ambulatory  Care Management  Direct Number: [phone]

## 2023-03-09 ENCOUNTER — Ambulatory Visit: Payer: Self-pay | Admitting: Licensed Clinical Social Worker

## 2023-03-10 NOTE — Patient Outreach (Signed)
  Care Coordination   Initial Visit Note   03/09/2023 Name: [name] [name] MRN: [id number] DOB: February 20, 1951  [name] [name] is a 72 y.o. year old female who sees [name], MD for primary care. I spoke with  [name] [name] by phone today.  What matters to the patients health and wellness today?  Ms. Mince reports she is attending grief counseling once a month. LCSW A. Riane Rung encourage Ms. Stough to continue grief counseling and consider additionally counseling with a mental health therapist.     Goals Addressed             This Visit's Progress    Care Coordination       Activities and task to complete in order to accomplish goals.   Start / continue relaxed breathing 3 times daily Continue with compliance of taking medication prescribed by Doctor Self Support options  (continue with grief counseling montly visit considering frequent counseling session with a therapist)         SDOH assessments and interventions completed:  Yes  SDOH Interventions Today    Flowsheet Row Most Recent Value  SDOH Interventions   Food Insecurity Interventions Intervention Not Indicated  Housing Interventions Intervention Not Indicated        Care Coordination Interventions:  Yes, provided  Interventions Today    Flowsheet Row Most Recent Value  Chronic Disease   Chronic disease during today's visit Other  [depression associated with grief]  Mental Health Interventions   Mental Health Discussed/Reviewed Grief and Loss, Coping Strategies, Depression  [Son and grandson passed away. Ms. Scripture is depressive symptoms associated with grief. Going to grief counseling at church. Pt reports all her 3 children are deceased.]       Follow up plan: Follow up call scheduled for 03/23/2023    Encounter Outcome:  Patient Visit Completed   Gwyndolyn Saxon MSW, LCSW Licensed Clinical Social Worker  [facility], Population Health Direct Dial: [phone]  Fax:  [phone]

## 2023-03-11 ENCOUNTER — Telehealth: Payer: Self-pay

## 2023-03-11 NOTE — Telephone Encounter (Signed)
Received VM from [facility] regarding concerns with patient.   She reports that there are some medication discrepancies between the medications that she has and those that are on med list.   Attempted to return call to [facility], she did not answer. Lvm informing that patient has upcoming appointment with PCP and Dr. Raymondo Band on 11/22.  Provided with contact information for further questions or concerns.   Veronda Prude, RN

## 2023-03-12 NOTE — Progress Notes (Signed)
    SUBJECTIVE:   CHIEF COMPLAINT / HPI:   Chronic cough Today, pt reports continued congestion and coughing, now ongoing x 4-5 weeks. Productive of green sputum At last visit 11/4 was prescribed augmentin BID x7d which she completed without improvement Reports subjective fevers at night   Seen by Dr. Raymondo Band today who prescribed CGM, was also restarted on seroquel, has close f/u scheduled with him   PERTINENT  PMH / PSH: COPD, tobacco use, T2DM, depression/anxiety  OBJECTIVE:   BP (!) 142/58   Pulse 82   Ht 5\' 3"  (1.6 m)   Wt 195 lb 12.8 oz (88.8 kg)   SpO2 100%   BMI 34.68 kg/m   General: NAD, pleasant, able to participate in exam Cardiac: RRR, no murmurs auscultated Respiratory: CTAB, normal WOB Abdomen: soft, non-tender, non-distended, normoactive bowel sounds Extremities: warm and well perfused, no edema or cyanosis Skin: warm and dry, no rashes noted Neuro: alert, no obvious focal deficits, speech normal Psych: Normal affect and mood  ASSESSMENT/PLAN:   Assessment & Plan Chronic cough Given lack of improvement with antibiotics, suspect that her chronic cough is worsening in the setting of COPD and tobacco use.  Patient reports she has not been taking her Symbicort so I have refilled this and advised her to start taking this again.  She has close follow-up scheduled with Dr. Raymondo Band in 2 weeks at which time her medication regimen and inhaler regimen can be reviewed as well. Reassuringly benign pulmonary exam but will obtain chest x-ray given no recent imaging and duration of symptoms.   [name], MD [facility]

## 2023-03-13 ENCOUNTER — Encounter: Payer: Self-pay | Admitting: Family Medicine

## 2023-03-13 ENCOUNTER — Ambulatory Visit (INDEPENDENT_AMBULATORY_CARE_PROVIDER_SITE_OTHER): Payer: Medicare HMO | Admitting: Family Medicine

## 2023-03-13 ENCOUNTER — Encounter: Payer: Self-pay | Admitting: Pharmacist

## 2023-03-13 ENCOUNTER — Ambulatory Visit (INDEPENDENT_AMBULATORY_CARE_PROVIDER_SITE_OTHER): Payer: Medicare HMO | Admitting: Pharmacist

## 2023-03-13 VITALS — BP 130/66 | HR 83 | Wt 195.8 lb

## 2023-03-13 VITALS — BP 142/58 | HR 82 | Ht 63.0 in | Wt 195.8 lb

## 2023-03-13 DIAGNOSIS — R053 Chronic cough: Secondary | ICD-10-CM | POA: Diagnosis not present

## 2023-03-13 DIAGNOSIS — Z720 Tobacco use: Secondary | ICD-10-CM

## 2023-03-13 DIAGNOSIS — J4489 Other specified chronic obstructive pulmonary disease: Secondary | ICD-10-CM | POA: Diagnosis not present

## 2023-03-13 DIAGNOSIS — E1169 Type 2 diabetes mellitus with other specified complication: Secondary | ICD-10-CM | POA: Diagnosis not present

## 2023-03-13 DIAGNOSIS — J441 Chronic obstructive pulmonary disease with (acute) exacerbation: Secondary | ICD-10-CM

## 2023-03-13 DIAGNOSIS — E1142 Type 2 diabetes mellitus with diabetic polyneuropathy: Secondary | ICD-10-CM | POA: Diagnosis not present

## 2023-03-13 DIAGNOSIS — J449 Chronic obstructive pulmonary disease, unspecified: Secondary | ICD-10-CM

## 2023-03-13 DIAGNOSIS — E785 Hyperlipidemia, unspecified: Secondary | ICD-10-CM

## 2023-03-13 MED ORDER — ALBUTEROL SULFATE HFA 108 (90 BASE) MCG/ACT IN AERS: 2.0000 | INHALATION_SPRAY | Freq: Four times a day (QID) | RESPIRATORY_TRACT | 2 refills | Status: DC | PRN

## 2023-03-13 MED ORDER — ALBUTEROL SULFATE HFA 108 (90 BASE) MCG/ACT IN AERS: 2.0000 | INHALATION_SPRAY | Freq: Four times a day (QID) | RESPIRATORY_TRACT | 2 refills | Status: AC | PRN

## 2023-03-13 MED ORDER — QUETIAPINE FUMARATE ER 200 MG PO TB24: 200.0000 mg | ORAL_TABLET | Freq: Every day | ORAL | 3 refills | Status: DC

## 2023-03-13 MED ORDER — BUDESONIDE-FORMOTEROL FUMARATE 80-4.5 MCG/ACT IN AERO: 2.0000 | INHALATION_SPRAY | Freq: Two times a day (BID) | RESPIRATORY_TRACT | 3 refills | Status: DC

## 2023-03-13 MED ORDER — FREESTYLE LIBRE 3 READER DEVI: 1.0000 | Freq: Once | 0 refills | Status: AC

## 2023-03-13 MED ORDER — FREESTYLE LIBRE 3 SENSOR MISC: 11 refills | Status: DC

## 2023-03-13 NOTE — Assessment & Plan Note (Signed)
Given lack of improvement with antibiotics, suspect that her chronic cough is worsening in the setting of COPD and tobacco use.  Patient reports she has not been taking her Symbicort so I have refilled this and advised her to start taking this again.  She has close follow-up scheduled with Dr. Raymondo Band in 2 weeks at which time her medication regimen and inhaler regimen can be reviewed as well. Reassuringly benign pulmonary exam but will obtain chest x-ray given no recent imaging and duration of symptoms.

## 2023-03-13 NOTE — Assessment & Plan Note (Signed)
Tobacco use disorder: patient smokes ~2 times weekly, with a maximum of 4 cigarettes per day. Stress is her biggest trigger, she has lost both her son and grandson in the past couple of months. -- Continue to think about quit date and motivators to quit.  -- Encouraged continued work toward complete abstinence and also provided support for her losses.

## 2023-03-13 NOTE — Progress Notes (Signed)
Reviewed and agree with Dr Koval's plan.   

## 2023-03-13 NOTE — Progress Notes (Signed)
S:     Chief Complaint  Patient presents with   Medication Management    Medication review, T2DM, Freestyle Libre 3 initiation   72 y.o. female who presents for diabetes evaluation, education, and management. Patient arrives in good spirits and presents with rollator for mobility.   Patient was referred and last seen by Primary Care Provider, Dr. Barb Merino, on 02/23/23.  Patient was referred for a review of medications. Reports she is not sleeping well at night ever since she was taken off quetiapine.  PMH is significant for T2DM, HTN, HLD, chronic nausea, tobacco use disorder.  At last visit, semaglutide (Ozempic) was increased to 0.5 mg weekly.    Patient reports Diabetes was diagnosed about 10 years ago.   Family/Social History: brother, sister, granddaughter (T2DM)  Current diabetes medications include: insulin glargine (Lantus) 18 units twice daily -- prescribed as daily, semaglutide (Ozempic) 0.5 mg [place] weekly. Current hypertension medications include: carvedilol 25 mg BID AC, amlodipine 10 mg QHS Current hyperlipidemia medications include: rosuvastatin 40 mg daily  Patient reports adherence to taking all medications as prescribed.  She is out of refills for several medications: losartan, mirtazapine, and albuterol.   Do you feel that your medications are working for you? yes Have you been experiencing any side effects to the medications prescribed? No  Do you have any problems obtaining medications due to transportation or finances? no Insurance coverage: [facility] Medicare  Patient reports occasional hypoglycemic events -- shaky, disoriented.  Reported home fasting blood sugars: 122, 199, 176, 203 Reported 2 hour post-meal/random blood sugars: does not check at other times of the day  Patient reports nocturia (nighttime urination) every 3 hours.  Patient reports neuropathy (nerve pain). Sometimes gabapentin takes the edge off, soaks feet in epsom salts/vinegar. Patient  reports new visual changes recently w/ dizziness/fatigue. Patient reports daily self foot exams.   Patient reported dietary habits: Reports not eating much throughout the day. Breakfast: coffee with a little sugar, no cream Lunch: scrambled eggs (~2 pm), Jello, applesauce, toast, broth/soup Dinner: green beans, baked beans, corn, peas, okra, sometimes bald ribs, chicken (protein 1-2x weekly) Snacks: PB&J, sugary cereals late at night (Frosted Flakes, Honey Nut Cheerios, Cinnamon Education officer, museum) Drinks: 6 months since meal replacement drinks, staying well-hydrated with water  Patient-reported exercise habits: exercise limited by mobility   O:   Review of Systems  All other systems reviewed and are negative.   Physical Exam Vitals reviewed.  Constitutional:      Appearance: Normal appearance.  Pulmonary:     Effort: Pulmonary effort is normal.  Neurological:     Mental Status: She is alert.  Psychiatric:        Thought Content: Thought content normal.        Judgment: Judgment normal.      Lab Results  Component Value Date   HGBA1C 8.0 (A) 02/23/2023   Vitals:   03/13/23 0916  BP: 130/66  Pulse: 83  SpO2: 99%    Lipid Panel     Component Value Date/Time   CHOL 206 (H) 11/07/2022 1020   TRIG 201 (H) 11/07/2022 1020   HDL 44 11/07/2022 1020   CHOLHDL 4.7 (H) 11/07/2022 1020   CHOLHDL 3.8 01/14/2016 2308   VLDL 37 01/14/2016 2308   LDLCALC 126 (H) 11/07/2022 1020   LDLDIRECT 62.5 12/01/2011 0832    Clinical Atherosclerotic Cardiovascular Disease (ASCVD): Yes  The ASCVD Risk score (Arnett DK, et al., 2019) failed to calculate for the following reasons:  The patient has a prior MI or stroke diagnosis   A/P: Diabetes longstanding currently with recent A1C 8%. Denies symptomatic low glucose readings.  Patient is able to verbalize appropriate hypoglycemia management plan. Medication adherence appears good. Control is suboptimal due to sporadic eating schedule, not  checking BGs frequently. -- Continue all medications the same.  -- Gave Freestyle Libre 3 sensors, prescription for reader and sensors ordered. Patient will hopefully place sensor and connect reader at home with granddaughter Lowanda Foster) and return in 2 weeks for CGM data review. Education was given about purpose and use of CGM.  -Patient educated on purpose, proper use, and potential adverse effects.  -Extensively discussed pathophysiology of diabetes, recommended lifestyle interventions, dietary effects on blood sugar control.  -Counseled on s/sx of and management of hypoglycemia.   ASCVD risk - secondary prevention in patient with diabetes. Last LDL is 126 not at goal of <16 mg/dL. ASCVD risk factors include HTN, DM, tobacco use. High intensity statin indicated. -- Continue rosuvastatin 40 mg daily.   Hypertension longstanding currently controlled on carvedilol and amlodipine. Ran out of refills of losartan, will not be re-initiating today. Blood pressure goal of <130/80 mmHg. Medication adherence good.  -- Continue carvedilol 25 mg BID AC and amlodipine 10 mg QHS  Tobacco use disorder: patient smokes ~2 times weekly, with a maximum of 4 cigarettes per day. Stress is her biggest trigger, she has lost both her son and grandson in the past couple of months. -- Continue to think about quit date and motivators to quit.  -- Encouraged continued work toward complete abstinence and also provided support for her losses.   Depresssion/Anxiety/ Insomnia - previously treated with quetiapine.  Patient interested in restarting.  Discussed with PCP, Dr. Barb Merino and provided new prescription.    Written patient instructions provided. Patient verbalized understanding of treatment plan.  Total time in face to face counseling 49 minutes.    Follow-up:  Pharmacist 03/27/23 PCP clinic visit 03/13/23 at 10:10 am Patient seen with Lendon Ka, PharmD Candidate and Shona Simpson, PharmD Candidate.

## 2023-03-13 NOTE — Patient Instructions (Signed)
Please take symbicort inhaler twice daily as prescribed  Please visit [facility] to get an x ray

## 2023-03-13 NOTE — Assessment & Plan Note (Signed)
Diabetes longstanding currently with recent A1C 8%. Denies symptomatic low glucose readings.  Patient is able to verbalize appropriate hypoglycemia management plan. Medication adherence appears good. Control is suboptimal due to sporadic eating schedule, not checking BGs frequently. -- Continue all medications the same.  -- Gave Freestyle Libre 3 sensors, prescription for reader and sensors ordered. Patient will hopefully place sensor and connect reader at home with granddaughter [name] [name]) and return in 2 weeks for CGM data review. Education was given about purpose and use of CGM.

## 2023-03-13 NOTE — Patient Instructions (Signed)
It was nice to see you today!  Your goal blood sugar is 80-130 before eating and less than 180 after eating.  Medication Changes: Continue all medications the same.   START Freestyle Libre 3 CGM with reader device.  Keep up the good work with diet and exercise. Aim for a diet full of vegetables, fruit and lean meats (chicken, [place], fish). Try to limit salt intake by eating fresh or frozen vegetables (instead of canned), rinse canned vegetables prior to cooking and do not add any additional salt to meals.   The sensor is small waterproof disc that is placed on the back of the upper arm.  There is a very thin filament that is inserted under the surface of the skin and measures the amount of glucose in the interstitial fluid.  This system collects your sugar levels for up to 14 days and it automatically records the glucose level every 15 minutes. This will show your provider any patterns in your glucose levels.  Please remember... 1. Sensor will last 14 days 2. Sensor should be applied to area away from scarring, tattoos, irritation, and bones. 3. Starting the sensor: 1 hour warm up before BG readings available   4. Scan the sensor at least every 8 hours for the FreeStyle Libre 2. Libre 3 does not require scanning.  5. Hold reader within 1.5 inches of sensor to scan 6. When the blood drop and magnifying glass symbol appears, test fingerstick blood glucose prior to making treatment decisions 7. Do a fingerstick blood glucose test if the sensor readings do not match how you feel 8. Remove sensor prior to magnetic resonance imaging (MRI), computed tomography (CT) scan, or high-frequency electrical heat (diathermy) treatment. 9. Freestyle Libre may be worn through a Industrial/product designer. It may not be exposed to an advanced Imaging Technology (AIT) body scanner (also called a millimeter wave scanner) or the baggage x-ray machine. Instead, ask for hand-wanding or full-body pat-down and  visual inspection.  10. Doses of vitamin C (ascorbic acid) >500 mg every day may cause false high readings. 11. Do not submerge more than 3 feet or keep underwater longer than 30 minutes at a time. Gently pat to dry.  12. Store sensor kit between 39 and 77 degrees Farenheit. Can be refrigerated within this temperature range.  Problems with Freestyle Libre sticking? 1. Order Tegaderm I.V. films to place directly over [facility] sensor on arm. 2. May also order Skin Tac from [facility]. Alcohol swab area you plan to administer Freestyle Libre then let dry. Once dry, apply Skin Tac in a circular motion (with a spot in the middle for sensor without skin tac) and let dry. Once dry you can apply Freestyle Libre   Problems taking off Freestyle [place]? 1. Remember to try to shower before removing Freestyle Libre 2. Order Tac Away to help remove any extra adhesive left on your skin once you remove Freestyle Libre 3. May also try baby oil to loosen adhesive  Freestyle [facility] Phone number: [phone] Available 7 days a week; excluding holidays 8 AM to 8PM EST  [email or web address]  Hypoglycemia or low blood sugar:   Low blood sugar can happen quickly and may become an emergency if not treated right away.   While this shouldn't happen often, it can be brought upon if you skip a meal or do not eat enough. Also, if your insulin or other diabetes medications are dosed too high, this can cause your blood sugar to go to  low.   Warning signs of low blood sugar include: Feeling shaky or dizzy Feeling weak or tired  Excessive hunger Feeling anxious or upset  Sweating even when you aren't exercising  What to do if I experience low blood sugar? Follow the Rule of 15 Check your blood sugar. If lower than 70, proceed to step 2.  Treat with 15 grams of fast acting carbs which is found in 3-4 glucose tablets. If none are available you can try hard candy, 1 tablespoon of sugar or honey,4 ounces  of fruit juice, or 6 ounces of REGULAR soda.  Re-check your sugar in 15 minutes. If it is still below 70, do what you did in step 2 again. If your blood sugar has come back up, go ahead and eat a snack or small meal made up of complex carbs (ex. Whole grains) and protein at this time to avoid recurrence of low blood sugar.

## 2023-03-23 ENCOUNTER — Ambulatory Visit: Payer: Self-pay | Admitting: Licensed Clinical Social Worker

## 2023-03-23 NOTE — Patient Outreach (Signed)
  Care Coordination   03/23/2023 Name: [name] [name] MRN: [id number] DOB: 1950-10-23   Care Coordination Outreach Attempts:  An unsuccessful telephone outreach was attempted for a scheduled appointment today.  Follow Up Plan:  Additional outreach attempts will be made to offer the patient care coordination information and services.   Encounter Outcome:  No Answer   Care Coordination Interventions:  No, not indicated    Jeanie Cooks, PhD [facility], [facility] Social Worker Direct Dial: [phone]  Fax: [phone]

## 2023-03-24 ENCOUNTER — Telehealth: Payer: Self-pay

## 2023-03-24 NOTE — Telephone Encounter (Signed)
Received the following message from Child psychotherapist.   Gwyndolyn Saxon, LCSW  [facility] Rn Team I am Gwyndolyn Saxon LCSW with care coordination. I completed a follow up call with your patient [name] [name]. Can you have a nurse from your team contact [name] [name] regarding her last visit. [name] [name] is requesting a blood pressure cuff, referral to radiology and antibiotics. I advise [name] [name] this is something I will send a message on her behalf.  Called patient per request. She did not answer, LVM asking that she return call to office.   Veronda Prude, RN

## 2023-03-24 NOTE — Patient Outreach (Signed)
  Care Coordination   Follow Up Visit Note   03/24/2023 Name: [name] [name] MRN: [id number] DOB: [birth year]  [name] [name] is a 72 y.o. year old female who sees [name] Drafts, MD for primary care. I spoke with  [name] [name] by phone today.  What matters to the patients health and wellness today?  [name] [name] request assistance obtaining a blood pressure cuff, referral to radiology and antibiotics. LCSW A [name] [name] send a message to [facility] for a nurse to contact [name] [name] regarding mentioned request.     Goals Addressed   None     SDOH assessments and interventions completed:  Yes     Care Coordination Interventions:  Yes, provided   Follow up plan: Follow up call scheduled for 04/06/2023    Encounter Outcome:  Patient Visit Completed   Gwyndolyn Saxon MSW, LCSW Licensed Clinical Social Worker  [facility], Population Health Direct Dial: [phone]  Fax: [phone]

## 2023-03-27 ENCOUNTER — Encounter: Payer: Self-pay | Admitting: Pharmacist

## 2023-03-27 ENCOUNTER — Ambulatory Visit (INDEPENDENT_AMBULATORY_CARE_PROVIDER_SITE_OTHER): Payer: Medicare HMO | Admitting: Pharmacist

## 2023-03-27 VITALS — BP 165/67 | HR 79 | Wt 199.6 lb

## 2023-03-27 DIAGNOSIS — B353 Tinea pedis: Secondary | ICD-10-CM

## 2023-03-27 DIAGNOSIS — B3781 Candidal esophagitis: Secondary | ICD-10-CM

## 2023-03-27 DIAGNOSIS — Z794 Long term (current) use of insulin: Secondary | ICD-10-CM

## 2023-03-27 DIAGNOSIS — L509 Urticaria, unspecified: Secondary | ICD-10-CM

## 2023-03-27 DIAGNOSIS — E1142 Type 2 diabetes mellitus with diabetic polyneuropathy: Secondary | ICD-10-CM

## 2023-03-27 DIAGNOSIS — B372 Candidiasis of skin and nail: Secondary | ICD-10-CM

## 2023-03-27 MED ORDER — NYSTATIN 100000 UNIT/GM EX POWD: Freq: Three times a day (TID) | CUTANEOUS | 3 refills | Status: AC

## 2023-03-27 MED ORDER — BD PEN NEEDLE NANO 2ND GEN 32G X 4 MM MISC: 1.0000 | 11 refills | Status: AC | PRN

## 2023-03-27 MED ORDER — NYSTATIN 100000 UNIT/ML MT SUSP: 200000.0000 [IU] | Freq: Four times a day (QID) | OROMUCOSAL | 1 refills | Status: AC

## 2023-03-27 MED ORDER — TRIAMCINOLONE ACETONIDE 0.1 % EX OINT: 1.0000 | TOPICAL_OINTMENT | Freq: Two times a day (BID) | CUTANEOUS | 1 refills | Status: AC

## 2023-03-27 MED ORDER — TERBINAFINE HCL 1 % EX CREA: 1.0000 | TOPICAL_CREAM | Freq: Two times a day (BID) | CUTANEOUS | 0 refills | Status: AC

## 2023-03-27 NOTE — Progress Notes (Signed)
    S:     Chief Complaint  Patient presents with   Medication Management    CGM - Diabetes   72 y.o. female who presents for diabetes evaluation, education, and management. Patient arrives in good spirits and presents with assistance of rolling walker with seat. Patient brings in all of medications for review.    Patient was referred and last seen by pharmacist clinic and Primary Care Provider, Dr. Barb Merino, on 03/13/2023.   PMH is significant for Diabetes and multiple medical problems.   At last visit, CGM was planned for initiation.  Patient arrives and states that her granddaughter was unable to help her set-up CGM - Majority of visit today was spent on medication review and setting up CGM with reader.   Current diabetes medications include: Lantus (insulin glargine) 18 units daily and Ozempic (semaglutide)   Patient reports adherence to taking most medications as prescribed however admits to only daily carvedilol and intermittent rosuvastatin.     Do you feel that your medications are working for you? yes Patient denies hypoglycemic events.    O:   Review of Systems  Musculoskeletal:  Positive for back pain and joint pain.  Psychiatric/Behavioral:  Positive for depression. The patient has insomnia.     Physical Exam Vitals reviewed.  Constitutional:      Appearance: Normal appearance.  Pulmonary:     Effort: Pulmonary effort is normal.  Neurological:     Mental Status: She is alert.  Psychiatric:        Mood and Affect: Mood normal.        Thought Content: Thought content normal.    Lab Results  Component Value Date   HGBA1C 8.0 (A) 02/23/2023   Vitals:   03/27/23 1035 03/27/23 1038  BP: (!) 176/76 (!) 165/67  Pulse: 80 79  SpO2: 100%     Lipid Panel     Component Value Date/Time   CHOL 206 (H) 11/07/2022 1020   TRIG 201 (H) 11/07/2022 1020   HDL 44 11/07/2022 1020   CHOLHDL 4.7 (H) 11/07/2022 1020   CHOLHDL 3.8 01/14/2016 2308   VLDL 37 01/14/2016  2308   LDLCALC 126 (H) 11/07/2022 1020   LDLDIRECT 62.5 12/01/2011 0832     A/P: Diabetes longstanding and currently with fair control using GLP and insulin combination therapy. Medication adherence appears fairly good.  -Spent majority of time setting up CGM with reader - Patient educated on proper use of CGM and device set-up with reader and Libre 3 sensor completed.   Following instruction patient verbalized understanding of treatment plan.  Continue medications for diabetes without change.  Asked patient to request granddaughter assist with CGM replacement in 14 days.   Spent > 20 minutes of time completing complete medication review and updating medication list.  - Encouraged patient to take Symbicort 2 puffs TWICE daily (was only taking PM dose) - Encouraged patient to take carvedilol twice daily (reported taking only once daily) - Encouraged to take rosuvastatin daily (admits intermittent use) - Refilled multiple chronic medications/supplies including pen needles per request of patient  Written patient instructions provided. Patient verbalized understanding of treatment plan.  Total time in face to face counseling 38 minutes.    Follow-up:  Pharmacist in ~ 4 weeks for CGM review and education  PCP clinic visit in February.

## 2023-03-27 NOTE — Assessment & Plan Note (Signed)
Diabetes longstanding and currently with fair control using GLP and insulin combination therapy. Medication adherence appears fairly good.  -Spent majority of time setting up CGM with reader - Patient educated on proper use of CGM and device set-up with reader and Libre 3 sensor completed.   Following instruction patient verbalized understanding of treatment plan.  Continue medications for diabetes without change.  Asked patient to request granddaughter assist with CGM replacement in 14 days.

## 2023-03-27 NOTE — Patient Instructions (Signed)
It was nice to see you today!  Your goal blood sugar is 80-130 before eating and less than 180 after eating.  edication Changes:  Continue all other medication the same.   Monitor blood sugars at home and keep a log (glucometer or piece of paper) to bring with you to your next visit.  Keep up the good work with diet and exercise. Aim for a diet full of vegetables, fruit and lean meats (chicken, [place], fish). Try to limit salt intake by eating fresh or frozen vegetables (instead of canned), rinse canned vegetables prior to cooking and do not add any additional salt to meals.

## 2023-03-27 NOTE — Progress Notes (Signed)
Reviewed and agree with Dr Koval's plan.   

## 2023-04-08 ENCOUNTER — Telehealth: Payer: Self-pay | Admitting: Family Medicine

## 2023-04-08 NOTE — Telephone Encounter (Signed)
Patient went to [facility] dentist office to discuss getting all her teeth pulled. Patient calls requesting Dr. Barb Merino to fax a doctors note to [facility] office at [phone] stating she can be put to sleep to get this done. Patient states this is very important for her to get all her teeth out as they keep her up all night. Please call patient with any questions or concerns.

## 2023-04-10 ENCOUNTER — Telehealth: Payer: Self-pay | Admitting: *Deleted

## 2023-04-10 ENCOUNTER — Other Ambulatory Visit: Payer: Self-pay | Admitting: *Deleted

## 2023-04-10 NOTE — Patient Outreach (Signed)
  Care Management   Follow Up Note   04/10/2023 Name: [name] [name] MRN: [id number] DOB: 1951/01/07   Referred by: [name], MD Reason for referral : Care Management (RNCM: Attempt to follow up for Chronic Disease Management & Care Coordination Needs)   An unsuccessful telephone outreach was attempted today. The patient was referred to the case management team for assistance with care management and care coordination.   Follow Up Plan: The care management team will reach out to the patient again over the next 30 days.   Danise Edge, BSN RN RN Care Manager  [place]  Ambulatory Care Management  Direct Number: [phone]

## 2023-04-10 NOTE — Telephone Encounter (Signed)
Typically the dentists office will send a formal pre-operative clearance form for me to fill out, and the patient usually needs a dedicated visit for this. When you have a chance please reach out to either the dentist office directly to request this, or inform the patient to do so. Thanks for your help

## 2023-04-13 NOTE — Telephone Encounter (Signed)
Contacted the dentist office twice and left a voice mail stating for them to fax [place] over the pre op forms so we can filled them out.  Left the company our fax number.

## 2023-04-16 ENCOUNTER — Telehealth: Payer: Self-pay | Admitting: *Deleted

## 2023-04-16 NOTE — Progress Notes (Signed)
Complex Care Management Care Guide Note  04/16/2023 Name: [name] [name] MRN: [id number] DOB: [birth year]  [name] [name] is a 72 y.o. year old female who is a primary care patient of [name], MD and is actively engaged with the care management team. I reached out to [facility] by phone today to assist with re-scheduling  with the RN Case Manager Licensed Clinical Social Worker BSW.  Follow up plan: Unsuccessful telephone outreach attempt made. A HIPAA compliant phone message was left for the patient providing contact information and requesting a return call.  [facility]  Care Coordination Care Guide  Direct Dial: [phone]

## 2023-04-17 ENCOUNTER — Ambulatory Visit: Payer: Self-pay | Admitting: Licensed Clinical Social Worker

## 2023-04-17 NOTE — Progress Notes (Signed)
Complex Care Management Care Guide Note  04/17/2023 Name: [name] [name] MRN: [id number] DOB: 1950/07/13  [name] [name] is a 72 y.o. year old female who is a primary care patient of [name], MD and is actively engaged with the care management team. I reached out to [facility] by phone today to assist with re-scheduling  with the RN Case Manager Licensed Clinical Social Worker BSW.  Follow up plan: Telephone appointment with complex care management team member scheduled for:  12/27  [facility] Coordination Care Guide  Direct Dial: [phone]

## 2023-04-17 NOTE — Patient Outreach (Signed)
  Care Coordination   04/17/2023 Name: Callisto Wendel MRN: [id number] DOB: 07/19/1950   Care Coordination Outreach Attempts:  An unsuccessful telephone outreach was attempted today to offer the patient information about available complex care management services.  Follow Up Plan:  Additional outreach attempts will be made to offer the patient complex care management information and services.   Encounter Outcome:  No Answer   Care Coordination Interventions:  No, not indicated    LCSW A. Felton Clinton called grandddaughter and pt home phone number listed. Unable to leave voicemail on [phone] due to mailbox being full. Left message with Granddaughter Brayton El to have pt return phone call.   Gwyndolyn Saxon MSW, LCSW Licensed Clinical Social Worker  [facility], Population Health Direct Dial: [phone]  Fax: [phone]

## 2023-04-21 ENCOUNTER — Telehealth: Payer: Self-pay | Admitting: *Deleted

## 2023-04-21 ENCOUNTER — Other Ambulatory Visit: Payer: Self-pay | Admitting: *Deleted

## 2023-04-21 NOTE — Patient Outreach (Signed)
  Care Management   Follow Up Note   04/21/2023 Name: [name] [name] MRN: [id number] DOB: [birth year]   Referred by: Romelle Booty, MD Reason for referral : Care Management (RNCM: Attempt to follow up for Chronic Disease Management & Care Coordination Needs)   A second unsuccessful telephone outreach was attempted today. The patient was referred to the case management team for assistance with care management and care coordination.   Follow Up Plan: The care management team will reach out to the patient again over the next 30 days.   Rosina Forte, BSN RN RN Care Manager  [place]  Ambulatory Care Management  Direct Number: [phone]

## 2023-04-23 NOTE — Telephone Encounter (Signed)
 I have received the clearance form and have placed it int he doctors box.   Thanks!

## 2023-04-27 NOTE — Telephone Encounter (Signed)
 Please schedule patient for an appointment for surgical clearance, thanks

## 2023-04-29 ENCOUNTER — Encounter: Payer: Self-pay | Admitting: Family Medicine

## 2023-04-29 ENCOUNTER — Ambulatory Visit: Payer: Medicare HMO | Admitting: Family Medicine

## 2023-04-29 VITALS — BP 120/60 | HR 83 | Ht 63.0 in | Wt 194.0 lb

## 2023-04-29 DIAGNOSIS — I25119 Atherosclerotic heart disease of native coronary artery with unspecified angina pectoris: Secondary | ICD-10-CM

## 2023-04-29 DIAGNOSIS — Z01818 Encounter for other preprocedural examination: Secondary | ICD-10-CM

## 2023-04-29 DIAGNOSIS — K089 Disorder of teeth and supporting structures, unspecified: Secondary | ICD-10-CM

## 2023-04-29 NOTE — Progress Notes (Signed)
    SUBJECTIVE:   CHIEF COMPLAINT / HPI:   Here for medical clearance for surgical procedure: Extraction of 6 teeth with alveoloplasty under general anesthesia  Oral surgeon requesting recent labs and clearance for procedure with proposed anesthesia   Surgical Pre-operative Evaluation:   Procedure: Extraction 6 teeth with alveoloplasty Procedural risk: intermediate (6 teeth extraction)   Anesthesia: general  PMH: HTN, T2DM, CAD, CHF, COPD, NAFLD, diabetic gastroparesis, recurrent pancreatitis, dyslipidemia, spinal stenosis, tobacco use, chronic pain  Surgical History:  The patient denies any complication with anesthesia, bleeding, or post-operative confusion, nausea, or vomiting in prior surgical interventions. The patient denies any  history of spinal surgeries.  Family History: The patient denies any family history of complications with anesthesia and VTE.  Social History: Tobacco Use: about 1-2 cigarettes daily Alcohol  Use: no Other substance use: no  Medication Adjustments:  -Hold ASA morning of surgery (NPO), OK to resume afterwards  Risk Stratification Tool: Charlanne Calculator: <2% risk of MI or cardiac event, intraoperatively or up to 30 days post-op  Functional Capacity:  METS ~4 - able to walk up stairs and about 1-2 blocks  Labs: -Check CBC, CMP, PT/INR  ECG: NSR with scattered t-wave inversions stable from prior EKG, asymptomatic today   PERTINENT  PMH / PSH: as above  OBJECTIVE:   BP 120/60   Pulse 83   Ht 5' 3 (1.6 m)   Wt 194 lb (88 kg)   SpO2 99%   BMI 34.37 kg/m    General: NAD, pleasant, able to participate in exam. Using cane Cardiac: RRR, no murmurs auscultated Respiratory: CTAB, normal WOB Abdomen: soft, non-tender, non-distended, normoactive bowel sounds Extremities: warm and well perfused, no edema or cyanosis Skin: warm and dry, no rashes noted Neuro: alert, no obvious focal deficits, speech normal Psych: Normal affect and  mood  ASSESSMENT/PLAN:   Assessment & Plan Preop examination The patient is at intermediate risk of complications from a moderate risk surgery.  I recommend the following additional tests: Repeat echo - most recent in 2016. Had a normal stress test in 2022 which did show EF 45-54% which was slightly decreased from her 2016 echo.  If her echo is stable, likely does not need cardiology evaluation prior to her surgery given her reasonable functional status. She had a colonoscopy under anesthesia earlier in 2024 and did well.  Echo scheduled for January 17 at 10 AM Check CBC, CMP, Coags. Most recent A1c 3mo ago 8.0 - will get labs checked when she goes for her echo, discussed w/ patient I recommend the following changes to medications in the perioperative setting: Hold aspirin  on the morning of surgery, okay to resume after   Payton Coward, MD [facility]

## 2023-04-29 NOTE — Patient Instructions (Addendum)
 Please do not take your aspirin  on the day of your surgery  Please get an echocardiogram prior to being cleared for surgery-scheduled for next Friday at 10 AM  Continue taking the remainder of your medications  Remember to take insulin  daily and monitor blood sugar.  Lets follow-up in a month to discuss diabetes and blood pressure

## 2023-04-30 ENCOUNTER — Other Ambulatory Visit: Payer: Self-pay

## 2023-04-30 NOTE — Patient Outreach (Signed)
 Aging Gracefully Program  04/30/2023  [name] [name] 1950-10-14 [id number]   [facility] Evaluation Interviewer attempted to call patient on today regarding Aging Gracefully referral. No answer from patient after multiple rings. CMA left confidential voicemail for patient to return call.  Will attempt to call back within 1 week.   Shereen Gin Care Management Assistant [phone]

## 2023-05-01 ENCOUNTER — Ambulatory Visit: Payer: Medicare HMO | Admitting: Pharmacist

## 2023-05-04 ENCOUNTER — Ambulatory Visit: Payer: Self-pay | Admitting: Licensed Clinical Social Worker

## 2023-05-04 NOTE — Patient Instructions (Signed)
 Visit Information  Thank you for taking time to visit with me today. Please don't hesitate to contact me if I can be of assistance to you.   Following are the goals we discussed today:   Goals Addressed             This Visit's Progress    Care Coordination Activities       Care Coordination Interventions: Patient stated that she is in need of a BP cuff and that [employer] does not provide her enough on the card to purchase. SW will make a pharmacy referral and send a message to the PCP as well as speak with the assigned RN the team to see what can be done. Patient stated that she has not mentioned this to her PCP regarding the need. Patient stated that has SCAT for transportation and that she is receiving $23 in SNAP benefits and her food is ok at the moment, after the SW talked to the patient about the local food pantry's in the community the patient agreed to receive a list of food pantry's, SW will mail out list. Patient stated that her utility bill was high but it is on a payment arrangement and slao wants the SW to mail out resources for assistance if she were to ever fall behind, SW completed the SDOH questions and no other needs or concerns. SW will follow up on 05/18/2023 at 11:00 am        Our next appointment is by telephone on 05/18/2023 at 11:00 am  Please call the care guide team at [phone] if you need to cancel or reschedule your appointment.   If you are experiencing a Mental Health or Behavioral Health Crisis or need someone to talk to, please call the Suicide and Crisis Lifeline: 988 go to [facility] [address] [place] [phone]) call 911  The patient verbalized understanding of instructions, educational materials, and care plan provided today and DECLINED offer to receive copy of patient instructions, educational materials, and care plan.   [name] CHARM Maranda HEDWIG, PhD [facility],  [facility] Social Worker Direct Dial: [phone]  Fax: [phone]

## 2023-05-04 NOTE — Patient Outreach (Signed)
  Care Coordination   Initial Visit Note   05/04/2023 Name: [name] [name] MRN: [id number] DOB: 11-09-1950  [name] [name] is a 73 y.o. year old female who sees Romelle Booty, MD for primary care. I spoke with  [name] [name] by phone today.  What matters to the patients health and wellness today?  Utilities and BP Cuff needed    Goals Addressed             This Visit's Progress    Care Coordination Activities       Care Coordination Interventions: Patient stated that she is in need of a BP cuff and that [employer] does not provide her enough on the card to purchase. SW will make a pharmacy referral and send a message to the PCP as well as speak with the assigned RN the team to see what can be done. Patient stated that she has not mentioned this to her PCP regarding the need. Patient stated that has [place]AT for transportation and that she is receiving $23 in SNAP benefits and her food is ok at the moment, after the SW talked to the patient about the local food pantry's in the community the patient agreed to receive a list of food pantry's, SW will mail out list. SW completed the SDOH questions and no other needs or concerns. SW will follow up on 05/18/2023 at 11:00 am        SDOH assessments and interventions completed:  Yes  SDOH Interventions Today    Flowsheet Row Most Recent Value  SDOH Interventions   Food Insecurity Interventions Other (Comment), Community Resources Provided  [name] [name] only receives $23 in food stamps, SW will mail out a food pantry list]  Transportation Interventions Intervention Not Indicated  [Patient has SCAT]  Utilities Interventions Intervention Not Indicated        Care Coordination Interventions:  No, not indicated   Follow up plan: Follow up call scheduled for 05/18/2023 at 11:00 am    Encounter Outcome:  Patient Visit Completed   Tobias CHARM Maranda HEDWIG, PhD [facility], [facility] Social Worker Direct Dial:  [phone]  Fax: [phone]

## 2023-05-05 ENCOUNTER — Telehealth: Payer: Self-pay | Admitting: Pharmacist

## 2023-05-05 NOTE — Telephone Encounter (Signed)
-----   Message from Tobias JONETTA Moose sent at 05/04/2023  2:40 PM EST ----- Good Afternoon Dr. MARLA,   I am Tobias Moose a Social Worker in [place] and I have a question. I have the patient above that is in need of a BP cuff. Can you tell me which pharmacist can I check with at [facility] to see if they can provide a BP kit/cuff to the patient.   Also one of the LCSW's in my unit also wanted to know the same for a patient that she is working with and that they also need a pill organizer for a patient.   Please advise,  Tobias CHARM Moose HEDWIG, PhD [facility], [facility] Social Worker Direct Dial: [phone]  Fax:[phone]

## 2023-05-05 NOTE — Telephone Encounter (Signed)
 Patient had appointment with me for 05/01/2023  Patient contacted for follow-up of request for BP  monitor and pill box.   Since last contact patient reports unable to get CGM sensor replaced  She was willing to reschedule for 05/12/2023 at 11:00 AM  Hope to resolve all three at that visit.    Total time with patient call and documentation of interaction: 9 minutes.

## 2023-05-08 ENCOUNTER — Ambulatory Visit (HOSPITAL_COMMUNITY)
Admission: RE | Admit: 2023-05-08 | Discharge: 2023-05-08 | Disposition: A | Discharge status: Home / Self Care | Payer: Medicare HMO | Source: Ambulatory Visit | Attending: Family Medicine | Admitting: Family Medicine

## 2023-05-08 ENCOUNTER — Telehealth: Payer: Self-pay | Admitting: Family Medicine

## 2023-05-08 DIAGNOSIS — E785 Hyperlipidemia, unspecified: Secondary | ICD-10-CM | POA: Diagnosis not present

## 2023-05-08 DIAGNOSIS — J449 Chronic obstructive pulmonary disease, unspecified: Secondary | ICD-10-CM | POA: Insufficient documentation

## 2023-05-08 DIAGNOSIS — I25119 Atherosclerotic heart disease of native coronary artery with unspecified angina pectoris: Secondary | ICD-10-CM | POA: Diagnosis not present

## 2023-05-08 DIAGNOSIS — Z01818 Encounter for other preprocedural examination: Secondary | ICD-10-CM

## 2023-05-08 DIAGNOSIS — I509 Heart failure, unspecified: Secondary | ICD-10-CM | POA: Diagnosis not present

## 2023-05-08 DIAGNOSIS — E119 Type 2 diabetes mellitus without complications: Secondary | ICD-10-CM | POA: Insufficient documentation

## 2023-05-08 DIAGNOSIS — I3139 Other pericardial effusion (noninflammatory): Secondary | ICD-10-CM | POA: Insufficient documentation

## 2023-05-08 DIAGNOSIS — I700 Atherosclerosis of aorta: Secondary | ICD-10-CM | POA: Insufficient documentation

## 2023-05-08 DIAGNOSIS — Z01810 Encounter for preprocedural cardiovascular examination: Secondary | ICD-10-CM | POA: Insufficient documentation

## 2023-05-08 DIAGNOSIS — I110 Hypertensive heart disease with heart failure: Secondary | ICD-10-CM | POA: Insufficient documentation

## 2023-05-08 DIAGNOSIS — I080 Rheumatic disorders of both mitral and aortic valves: Secondary | ICD-10-CM | POA: Diagnosis not present

## 2023-05-08 LAB — ECHOCARDIOGRAM COMPLETE
AR max vel: 2.05 cm2
AV Area VTI: 2.22 cm2
AV Area mean vel: 2.16 cm2
AV Mean grad: 6.5 mm[Hg]
AV Peak grad: 12.3 mm[Hg]
Ao pk vel: 1.75 m/s
Area-P 1/2: 4.21 cm2
Calc EF: 89.4 %
Est EF: >75
S' Lateral: 1.7 cm
Single Plane A2C EF: 93.5 %
Single Plane A4C EF: 82.8 %

## 2023-05-08 MED ORDER — PERFLUTREN LIPID MICROSPHERE
1.0000 mL | INTRAVENOUS | Status: AC | PRN
  Administered 2023-05-08: 4 mL via INTRAVENOUS
  Filled 2023-05-08: qty 10

## 2023-05-08 NOTE — Telephone Encounter (Signed)
Patient would like orders to be put in to get her labs drawn at our office on Tuesday January 21st while she is at our office seeing Raymondo Band. Patient was supposed to get this done at the hospital when she got her echo done, but she states she forgot all about it and left after getting echo done. Can orders be placed so she can get her labs drawn on Tuesday? According to AVS from last visit with [facility] - Check CBC, CMP, Coags. Most recent A1c 29mo ago 8.  Thanks!

## 2023-05-12 ENCOUNTER — Ambulatory Visit: Payer: Medicare HMO | Admitting: Pharmacist

## 2023-05-12 ENCOUNTER — Encounter: Payer: Self-pay | Admitting: Pharmacist

## 2023-05-12 VITALS — BP 148/64 | HR 78 | Wt 201.0 lb

## 2023-05-12 DIAGNOSIS — E785 Hyperlipidemia, unspecified: Secondary | ICD-10-CM

## 2023-05-12 DIAGNOSIS — E1169 Type 2 diabetes mellitus with other specified complication: Secondary | ICD-10-CM | POA: Diagnosis not present

## 2023-05-12 DIAGNOSIS — E1142 Type 2 diabetes mellitus with diabetic polyneuropathy: Secondary | ICD-10-CM | POA: Diagnosis not present

## 2023-05-12 DIAGNOSIS — E1159 Type 2 diabetes mellitus with other circulatory complications: Secondary | ICD-10-CM

## 2023-05-12 DIAGNOSIS — I152 Hypertension secondary to endocrine disorders: Secondary | ICD-10-CM | POA: Diagnosis not present

## 2023-05-12 MED ORDER — FREESTYLE LIBRE 3 SENSOR MISC: 11 refills | Status: DC

## 2023-05-12 MED ORDER — AMLODIPINE BESYLATE 10 MG PO TABS: ORAL_TABLET | ORAL | 9 refills | Status: DC

## 2023-05-12 MED ORDER — LOSARTAN POTASSIUM 25 MG PO TABS: 25.0000 mg | ORAL_TABLET | Freq: Every day | ORAL | 3 refills | Status: DC

## 2023-05-12 NOTE — Progress Notes (Signed)
   S:     Chief Complaint  Patient presents with   Medication Management    Amb BP monitor + pill box   73 y.o. female who presents for hypertension evaluation, education, and management. Patient arrives in good spirits and presents with rollator for mobility.  Discussed both pill box and home Blood pressure monitor with patient.   PMH is significant for hypertension and Type 2 diabetes  Patient was referred by Dr. Barb Merino on 04/29/2023 for review of medications, and blood pressure monitoring.  O:  Review of Systems  Gastrointestinal:  Positive for constipation.  Skin:  Positive for itching.  All other systems reviewed and are negative.   Physical Exam Vitals reviewed.  Pulmonary:     Effort: Pulmonary effort is normal.  Neurological:     Mental Status: She is alert.  Psychiatric:        Behavior: Behavior normal.     Last 3 Office BP readings: BP Readings from Last 3 Encounters:  05/12/23 (!) 148/64  04/29/23 120/60  03/27/23 (!) 165/67    Clinical Atherosclerotic Cardiovascular Disease (ASCVD): Yes  The ASCVD Risk score (Arnett DK, et al., 2019) failed to calculate for the following reasons:   Risk score cannot be calculated because patient has a medical history suggesting prior/existing ASCVD  Basic Metabolic Panel    Component Value Date/Time   NA 142 11/07/2022 1020   K 4.0 11/07/2022 1020   CL 100 11/07/2022 1020   CO2 24 11/07/2022 1020   GLUCOSE 168 (H) 11/07/2022 1020   GLUCOSE 172 (H) 09/12/2021 1104   BUN 9 11/07/2022 1020   CREATININE 0.85 11/07/2022 1020   CREATININE 0.75 03/19/2016 1444   CALCIUM 9.2 11/07/2022 1020   GFRNONAA >60 08/07/2020 1450   GFRNONAA 83 03/19/2016 1444   GFRAA 73 05/09/2019 1325   GFRAA >89 03/19/2016 1444    For Office Goal BP of <140 mmHg systolic  - lower if tolerated.     Recent echocargiogram completed.  Patient asked if it was OK. She requested expedited review so that she can be cleared for dental tooth  extraction - 6 teeth.  Dr. Elliot Gurney was willing to provide general review of results. Patient appreciated the news.   She will await medical clearance from Dr. Barb Merino.     A/P: History of hypertension longstanding currently taking amlodipine 10 mg daily, losartan 25 mg daily, and carvedilol 25 mg twice daily; with goal presssure of <130/80.   Found to have persistently elevated and uncontrolled blood pressure.  -Continue amlodipine 10mg  and carvedilol 25mg  BID -Restart losartan 25 mg daily  -Patient received a new blood pressure monitor and a pill box for medication management. Instructions were provided on the use of blood pressure monitor. -Pill Box provided.   Diabetes: -A new Libre 3 sensor applied to patient's left arm. Patient will activate the sensor with the reader with the help of her granddaughter.  -Continued all other medications  Results reviewed and written information provided.   Written patient instructions provided. Patient verbalized understanding of treatment plan.  Total time in face to face counseling 38 minutes.    Follow-up:  Pharmacist PRN PCP clinic visit in 06/02/2023 with Dr. Barb Merino  Patient seen with Lavona Mound, PharmD Candidate and Laqueta Jean, PharmD Candidate.

## 2023-05-12 NOTE — Assessment & Plan Note (Signed)
History of hypertension longstanding currently taking amlodipine 10 mg daily, losartan 25 mg daily, and carvedilol 25 mg twice daily; with goal presssure of <130/80.   Found to have persistently elevated and uncontrolled blood pressure.  -Continue amlodipine 10mg  and carvedilol 25mg  BID -Restart losartan 25 mg daily  -Patient received a new blood pressure monitor and a pill box for medication management. Instructions were provided on the use of blood pressure monitor. -Pill Box provided.

## 2023-05-12 NOTE — Patient Instructions (Addendum)
It was nice to see you today!  Your goal blood pressure is <130/80.  Medication Changes:  Restart losartan (Cozaar) 25 mg once daily  Continue all other medication the same.   Monitor blood pressure at home daily and keep a log (on your phone or piece of paper) to bring with you to your next visit. Write down date, time, blood pressure and pulse.  Keep up the good work with diet and exercise. Aim for a diet full of vegetables, fruit and lean meats (chicken, [place], fish). Try to limit salt intake by eating fresh or frozen vegetables (instead of canned), rinse canned vegetables prior to cooking and do not add any additional salt to meals.

## 2023-05-12 NOTE — Assessment & Plan Note (Signed)
Diabetes: -A new Libre 3 sensor applied to patient's left arm. Patient will activate the sensor with the reader with the help of her granddaughter.  -Continued all other medications

## 2023-05-13 NOTE — Progress Notes (Signed)
Reviewed and agree with Dr Koval's plan.   

## 2023-05-14 ENCOUNTER — Telehealth: Payer: Self-pay

## 2023-05-14 NOTE — Telephone Encounter (Signed)
Attempted to reach patient. No answer . Unable to LVM due to mailbox not set up. Aquilla Solian, CMA

## 2023-05-14 NOTE — Telephone Encounter (Signed)
-----   Message from [facility] sent at 05/13/2023  8:31 AM EST ----- Regarding: Schedule lab appt Patient has future lab orders in place for CBC, CMP, PT/INR. She needs to get these done in order to be cleared for her dental procedure. Can you please call her to schedule a lab appointment for these?  Atif

## 2023-05-15 NOTE — Telephone Encounter (Signed)
2nd attempt to reach patient to make lab only visit. No answer. Unable to LVM due to mailbox not being set up. Aquilla Solian, CMA

## 2023-05-18 ENCOUNTER — Ambulatory Visit: Payer: Self-pay | Admitting: Licensed Clinical Social Worker

## 2023-05-18 ENCOUNTER — Telehealth: Payer: Self-pay

## 2023-05-18 NOTE — Patient Outreach (Signed)
  Care Coordination   Follow Up Visit Note   05/18/2023 Name: [name] [name] MRN: [id number] DOB: April 11, 1951  Ronie Fleeger is a 73 y.o. year old female who sees [name], MD for primary care. I spoke with  [name] by phone today.  What matters to the patients health and wellness today?  Food Insecurities     Goals Addressed             This Visit's Progress    COMPLETED: Care Coordination Activities       Care Coordination Interventions: Patient stated that she is in need of a BP cuff and that [employer] does not provide her enough on the card to purchase. SW will make a pharmacy referral and send a message to the PCP as well as speak with the assigned RN the team to see what can be done. Patient stated that she has not mentioned this to her PCP regarding the need. Patient stated that has [employer] for transportation and that she is receiving $23 in SNAP benefits and her food is ok at the moment, after the SW talked to the patient about the local food pantry's in the community the patient agreed to receive a list of food pantry's, SW will mail out list. Patient stated that her utility bill was high but it is on a payment arrangement and slao wants the SW to mail out resources for assistance if she were to ever fall behind, SW completed the SDOH questions and no other needs or concerns. SW will follow up on 05/18/2023 at 11:00 am        SDOH assessments and interventions completed:  Yes  SDOH Interventions Today    Flowsheet Row Most Recent Value  SDOH Interventions   Food Insecurity Interventions Intervention Not Indicated  Housing Interventions Intervention Not Indicated  Transportation Interventions Intervention Not Indicated  Utilities Interventions Intervention Not Indicated        Care Coordination Interventions:  Yes, provided  Interventions Today    Flowsheet Row Most Recent Value  General Interventions   General Interventions Discussed/Reviewed General  Interventions Reviewed, Science writer received the food pantry list in the mail and will utilize the resources.]        Follow up plan: No further intervention required.   Encounter Outcome:  Patient Visit Completed  Jeanie Cooks, PhD [facility], [facility] Social Worker Direct Dial: [phone]  Fax: [phone]

## 2023-05-18 NOTE — Telephone Encounter (Signed)
3rd attempt to reach patient. No answer. Unable to LVM due to one not being set up. Patient needs to have lab only visit. Labs are in future orders. [name] [name], CMA

## 2023-05-18 NOTE — Telephone Encounter (Signed)
-----  Message from [facility] sent at 05/13/2023  8:31 AM EST ----- Regarding: Schedule lab appt Patient has future lab orders in place for CBC, CMP, PT/INR. She needs to get these done in order to be cleared for her dental procedure. Can you please call her to schedule a lab appointment for these?  Atif

## 2023-05-18 NOTE — Telephone Encounter (Signed)
Patient is scheduled for a lab appt 01/31 @11  am

## 2023-05-18 NOTE — Patient Instructions (Signed)
Visit Information  Thank you for taking time to visit with me today. Please don't hesitate to contact me if I can be of assistance to you.   Following are the goals we discussed today:   Goals Addressed             This Visit's Progress    COMPLETED: Care Coordination Activities       Care Coordination Interventions: Patient stated that she is in need of a BP cuff and that [employer] does not provide her enough on the card to purchase. SW will make a pharmacy referral and send a message to the PCP as well as speak with the assigned RN the team to see what can be done. Patient stated that she has not mentioned this to her PCP regarding the need. Patient stated that has [employer] for transportation and that she is receiving $23 in SNAP benefits and her food is ok at the moment, after the SW talked to the patient about the local food pantry's in the community the patient agreed to receive a list of food pantry's, SW will mail out list. Patient stated that her utility bill was high but it is on a payment arrangement and slao wants the SW to mail out resources for assistance if she were to ever fall behind, SW completed the SDOH questions and no other needs or concerns. SW will follow up on 05/18/2023 at 11:00 am        No further follow up needed, Sw Discussed with the patient that if any needs arise to let the PCP know that she needs to be connected to the SW.   Please call the care guide team at [phone] if you need to cancel or reschedule your appointment.   If you are experiencing a Mental Health or Behavioral Health Crisis or need someone to talk to, please call the Suicide and Crisis Lifeline: 988 go to [facility] [address] [place] [phone]) call 911  The patient verbalized understanding of instructions, educational materials, and care plan provided today and DECLINED offer to receive copy of patient instructions, educational materials,  and care plan.   Jeanie Cooks, PhD [facility], [facility] Social Worker Direct Dial: [phone]  Fax: [phone]

## 2023-05-21 ENCOUNTER — Ambulatory Visit: Payer: Self-pay | Admitting: Licensed Clinical Social Worker

## 2023-05-21 NOTE — Patient Outreach (Signed)
  Care Coordination   Follow Up Visit Note   05/21/2023 Name: [name] [name] MRN: [id number] DOB: 13-Mar-1951  [name] [name] is a 73 y.o. year old female who sees [name], MD for primary care. I spoke with  [name] by phone today.  What matters to the patients health and wellness today?  LCSW A. Milika Ventress and [name] [name] completed follow up visit via phone. [name] reports slight improvement in mood.  [name] request information regarding utility assistance for future use. LCSW A Jacory Kamel encourage [name] to apply for assistance at [email or web address]://[place].com/.     Goals Addressed             This Visit's Progress    COMPLETED: Care Coordination       Activities and task to complete in order to accomplish goals.   Start / continue relaxed breathing 3 times daily Continue with compliance of taking medication prescribed by Doctor Self Support options  (continue with grief counseling montly visit considering frequent counseling session with a therapist)  Reviewed goals with [name] 05/21/2023. [name] [name] [name] reports improvement in mood and support from granddaughter.         SDOH assessments and interventions completed:  No   SDOH completed 05/18/2023  Care Coordination Interventions:  Yes, provided  Interventions Today    Flowsheet Row Most Recent Value  General Interventions   General Interventions Discussed/Reviewed General Interventions Reviewed, [employer] [name] to apply for energy assistance online epass.[place].gov]  Mental Health Interventions   Mental Health Discussed/Reviewed Coping Strategies, Mental Health Reviewed  [LCSW A. Felton Clinton and Ms. Conwell discussed self care techniques to boost mood.]       Follow up plan: No further intervention required.   Encounter Outcome:  Patient Visit Completed   Gwyndolyn Saxon MSW, LCSW Licensed Clinical Social Worker  [facility], Population Health Direct Dial: [phone]  Fax: [phone]

## 2023-05-21 NOTE — Patient Instructions (Signed)
Visit Information  Thank you for taking time to visit with me today. Please don't hesitate to contact me if I can be of assistance to you.   Following are the goals we discussed today:   Goals Addressed             This Visit's Progress    COMPLETED: Care Coordination       Activities and task to complete in order to accomplish goals.   Start / continue relaxed breathing 3 times daily Continue with compliance of taking medication prescribed by Doctor Self Support options  (continue with grief counseling montly visit considering frequent counseling session with a therapist)  Reviewed goals with pt 05/21/2023. Pt [name] [name] reports improvement in mood and support from granddaughter.          Please call the care guide team at [phone] if you need to cancel or reschedule your appointment.   If you are experiencing a Mental Health or Behavioral Health Crisis or need someone to talk to, please call 911   Patient verbalizes understanding of instructions and care plan provided today and agrees to view in MyChart. Active MyChart status and patient understanding of how to access instructions and care plan via MyChart confirmed with patient.     The patient has been provided with contact information for the care management team and has been advised to call with any health related questions or concerns.   Gwyndolyn Saxon MSW, LCSW Licensed Clinical Social Worker  [facility], Population Health Direct Dial: [phone]  Fax: [phone]

## 2023-05-22 ENCOUNTER — Other Ambulatory Visit: Payer: Medicare HMO

## 2023-05-22 DIAGNOSIS — Z01818 Encounter for other preprocedural examination: Secondary | ICD-10-CM | POA: Diagnosis not present

## 2023-05-22 DIAGNOSIS — I25119 Atherosclerotic heart disease of native coronary artery with unspecified angina pectoris: Secondary | ICD-10-CM | POA: Diagnosis not present

## 2023-05-23 LAB — COMPREHENSIVE METABOLIC PANEL
ALT: 10 [IU]/L (ref 0–32)
AST: 13 [IU]/L (ref 0–40)
Albumin: 3.9 g/dL (ref 3.8–4.8)
Alkaline Phosphatase: 72 [IU]/L (ref 44–121)
BUN/Creatinine Ratio: 15 (ref 12–28)
BUN: 11 mg/dL (ref 8–27)
Bilirubin Total: 0.2 mg/dL (ref 0.0–1.2)
CO2: 23 mmol/L (ref 20–29)
Calcium: 8.7 mg/dL (ref 8.7–10.3)
Chloride: 105 mmol/L (ref 96–106)
Creatinine, Ser: 0.73 mg/dL (ref 0.57–1.00)
Globulin, Total: 3 g/dL (ref 1.5–4.5)
Glucose: 158 mg/dL — ABNORMAL HIGH (ref 70–99)
Potassium: 4 mmol/L (ref 3.5–5.2)
Sodium: 142 mmol/L (ref 134–144)
Total Protein: 6.9 g/dL (ref 6.0–8.5)
eGFR: 86 mL/min/{1.73_m2} (ref >59)

## 2023-05-23 LAB — CBC WITH DIFFERENTIAL/PLATELET
Basophils Absolute: 0.1 10*3/uL (ref 0.0–0.2)
Basos: 1 %
EOS (ABSOLUTE): 0.1 10*3/uL (ref 0.0–0.4)
Eos: 1 %
Hematocrit: 37.5 % (ref 34.0–46.6)
Hemoglobin: 12.2 g/dL (ref 11.1–15.9)
Immature Grans (Abs): 0 10*3/uL (ref 0.0–0.1)
Immature Granulocytes: 0 %
Lymphocytes Absolute: 2.3 10*3/uL (ref 0.7–3.1)
Lymphs: 33 %
MCH: 28.3 pg (ref 26.6–33.0)
MCHC: 32.5 g/dL (ref 31.5–35.7)
MCV: 87 fL (ref 79–97)
Monocytes Absolute: 0.3 10*3/uL (ref 0.1–0.9)
Monocytes: 5 %
Neutrophils Absolute: 4 10*3/uL (ref 1.4–7.0)
Neutrophils: 60 %
Platelets: 175 10*3/uL (ref 150–450)
RBC: 4.31 x10E6/uL (ref 3.77–5.28)
RDW: 12.3 % (ref 11.7–15.4)
WBC: 6.8 10*3/uL (ref 3.4–10.8)

## 2023-05-23 LAB — PROTIME-INR
INR: 1 (ref 0.9–1.2)
Prothrombin Time: 11.7 s (ref 9.1–12.0)

## 2023-05-27 ENCOUNTER — Telehealth: Payer: Self-pay | Admitting: *Deleted

## 2023-05-27 NOTE — Telephone Encounter (Signed)
Left message for the pt to call back to schedule in office appt for pre op clearance.  

## 2023-05-27 NOTE — Telephone Encounter (Signed)
   Name: [name] [name]  DOB: Oct 20, 1950  MRN: [id number]  Primary Cardiologist: Vinie JAYSON Maxcy, MD  Chart reviewed as part of pre-operative protocol coverage. Because of [name] [name]'s past medical history and time since last visit, she will require a follow-up in-office visit in order to better assess preoperative cardiovascular risk. (Patient was last seen 3 years and will need to reestablish care.)  Pre-op covering staff: - Please schedule appointment and call patient to inform them. If patient already had an upcoming appointment within acceptable timeframe, please add pre-op clearance to the appointment notes so provider is aware. - Please contact requesting surgeon's office via preferred method (i.e, phone, fax) to inform them of need for appointment prior to surgery.   Wyn Raddle, Jackee Shove, NP  05/27/2023, 10:39 AM

## 2023-05-27 NOTE — Telephone Encounter (Signed)
   Pre-operative Risk Assessment    Patient Name: [name] [name]  DOB: Jan 30, 1951 MRN: [id number]   Date of last office visit: 07/26/20 DR. HILTY Date of next office visit: NONE  Request for Surgical Clearance    Procedure:  Dental Extraction - Amount of Teeth to be Pulled:  6 TEETH FOR SURGICAL EXTRACTION WITH ALVEOLOPLASTY  (TO BE DONE IN HOSPITAL SETTING)  Date of Surgery:  Clearance TBD                                Surgeon:  DR. GLENDIA PRIMROSE, DMD Surgeon's Group or Practice Name:  GLENDIA PRIMROSE, DMD Phone number:  [phone] Fax number:  [phone]   Type of Clearance Requested:   - Medical  - Pharmacy:  Hold Aspirin      Type of Anesthesia:  General    Additional requests/questions:    Bonney Niels Jest   05/27/2023, 10:34 AM

## 2023-05-28 ENCOUNTER — Telehealth: Payer: Self-pay | Admitting: *Deleted

## 2023-05-28 ENCOUNTER — Other Ambulatory Visit: Payer: Self-pay | Admitting: *Deleted

## 2023-05-28 ENCOUNTER — Telehealth: Payer: Self-pay | Admitting: Internal Medicine

## 2023-05-28 NOTE — Telephone Encounter (Signed)
 Pt called back and has been scheduled in office appt 06/04/23 with Kathyrn Lawrence, DNP.   I will update all parties involved.

## 2023-05-28 NOTE — Patient Outreach (Signed)
  Care Management   Follow Up Note   05/28/2023 Name: [name] [name] MRN: [id number] DOB: [birth year]   Referred by: Romelle Booty, MD Reason for referral : Care Management (ATTEMPT #3 Follow Up For Chronic Disease Management & Care Coordination Needs)   An unsuccessful telephone outreach was attempted today. The patient was referred to the case management team for assistance with care management and care coordination.   Follow Up Plan: The care management team will reach out to the patient again over the next 30 days.   Rosina Sicard, BSN RN [facility], [facility] RN Care Manager Direct Dial: [phone]  Fax: [phone]

## 2023-05-28 NOTE — Telephone Encounter (Signed)
 Returned call, left msg for pt to return call to schedule in office pre op clearance appt.

## 2023-05-28 NOTE — Telephone Encounter (Signed)
 Returning call to nurse

## 2023-05-28 NOTE — Telephone Encounter (Signed)
 [name] [name], [name]2 minutes ago (9:16 AM)   BG Returning call to nurse      Note   [name] [name], [name] [name] [phone]  [name] [name]

## 2023-06-01 NOTE — Progress Notes (Signed)
Cardiology Office Note:  .   Date:  06/04/2023  ID:  [name], DOB 09/29/1950, MRN [id number] PCP: [name], MD  [facility] Providers Cardiologist:  Chrystie Nose, MD }   History of Present Illness: .   [name] [name] is a 73 y.o. female with a past medial history significant for HTN, HLD, DM2, prior stroke, history of diastolic CHF and elevated troponin in the setting of sepsis.  Stress test completed on 08/06/2020 was low risk EF 51% study was normal.    Echocardiogram completed on 05/08/2023 revealed an LVEF of greater than 75% with hyperdynamic function severe eccentric ventricular hypertrophy small pericardial effusion which is circumferential with no evidence of cardiac tamponade mild calcification of the aortic valve with mild thickening of the aortic valve without evidence of aortic valve stenosis, grade 3 protruding plaque involving the descending aorta.  She is here today for preoperative cardiac evaluation in order to have 6 teeth pulled with that alveoloplasty to be done in a hospital setting by Dr. Ocie Doyne on date to be determined.  They are requesting recommendations on holding aspirin.  She comes today recovering from cough and cold symptoms and has been on antibiotic therapy and steroids per primary care.  She has been having issues with asthma exacerbations as well.  She is using a walker for ambulation.  She has had her home modified to allow her to climb stairs with extra railings, a ramp outside her home, and her bathroom completed so that she has a walk-in shower which makes it easier due to difficult with ambulation as a result of the CVA.  ROS:   Studies Reviewed: .   Echocardiogram 04/28/2023 1. Left ventricular ejection fraction, by estimation, is >75%. The left  ventricle has hyperdynamic function. The left ventricle has no regional  wall motion abnormalities. There is severe eccentric left ventricular  hypertrophy. Left ventricular diastolic    parameters are indeterminate.   2. Right ventricular systolic function is normal. The right ventricular  size is normal.   3. A small pericardial effusion is present. The pericardial effusion is  circumferential. There is no evidence of cardiac tamponade.   4. The mitral valve is normal in structure. Trivial mitral valve  regurgitation. No evidence of mitral stenosis.   5. The aortic valve is grossly normal. There is mild calcification of the  aortic valve. There is mild thickening of the aortic valve. Aortic valve  regurgitation is not visualized. Aortic valve sclerosis/calcification is  present, without any evidence of  aortic stenosis.   6. There is Moderate (Grade III) protruding plaque involving the  descending aorta.   7. The inferior vena cava is normal in size with greater than 50%  respiratory variability, suggesting right atrial pressure of 3 mmHg.    EKG Interpretation Date/Time:  Thursday June 04 2023 09:08:07 EST Ventricular Rate:  72 PR Interval:  170 QRS Duration:  90 QT Interval:  420 QTC Calculation: 459 R Axis:   17  Text Interpretation: Normal sinus rhythm Left ventricular hypertrophy with repolarization abnormality ( R in aVL , Cornell product ) Cannot rule out Septal infarct , age undetermined When compared with ECG of 07-Aug-2020 21:09, PREVIOUS ECG IS PRESENT Confirmed by Joni Reining [phone]) on 06/04/2023 9:21:17 AM    Physical Exam:   VS:  BP (!) 140/72 (BP Location: Left Arm, Patient Position: Sitting, Cuff Size: Normal)   Pulse 72   Ht 5\' 3"  (1.6 m)   Wt 188 lb  9.6 oz (85.5 kg)   SpO2 97%   BMI 33.41 kg/m    Wt Readings from Last 3 Encounters:  06/04/23 188 lb 9.6 oz (85.5 kg)  06/02/23 189 lb 6 oz (85.9 kg)  05/12/23 201 lb (91.2 kg)    GEN: Well nourished, well developed in no acute distress NECK: No JVD; No carotid bruits CARDIAC: RRR, no murmurs, rubs, gallops RESPIRATORY:  Clear to auscultation without rales, wheezing or rhonchi   ABDOMEN: Soft, non-tender, non-distended EXTREMITIES:  No edema; No deformity   ASSESSMENT AND PLAN: .   Pre-Operative Cardiac Clearance:  According to the Revised Cardiac Risk Index (RCRI), her Perioperative Risk of Major Cardiac Event is (%): 6.6  Her Functional Capacity in METs is: 5.07 according to the Duke Activity Status Index (DASI).   Per office protocol, if patient is without any new symptoms or concerns at the time of their virtual visit, he/she may hold ASA for 7 days prior to procedure. Please resume ASA as soon as possible postprocedure, at the discretion of the surgeon.    Per office protocol, if patient is without any new symptoms or concerns at the time of their virtual visit, he/she may hold ASA for 7 days prior to procedure. Please resume ASA as soon as possible postprocedure, at the discretion of the surgeon.  She does not need SBE prophylaxis.  Therefore, based on ACC/AHA guidelines, patient would be at acceptable risk for the planned procedure without further cardiovascular testing. I will route this recommendation to the requesting party via Epic fax function.   2.  Hypertension: Blood pressure slightly elevated today but within normal limits for her.  No changes in her medication regimen at this time.  Continue amlodipine carvedilol and losartan as directed.   3.  Chronic diastolic CHF: No evidence of volume overload today.  Keeping blood pressure can controlled is imperative.   Signed, Bettey Mare. Liborio Nixon, ANP, AACC

## 2023-06-01 NOTE — Progress Notes (Cosign Needed Addendum)
    SUBJECTIVE:   CHIEF COMPLAINT / HPI:   Seen 1/8 for preop examination at which time blood work and echo were ordered Labs were unremarkable Echo showed hyperdynamic function and severe eccentric left ventricular hypertrophy  Patient has cardiology appointment scheduled 2/13 for preop clearance   T2DM -Current medication regimen: Lantus 18 units daily, semaglutide 0.5 mg weekly -Home CBGs: has CGM - usually runs 150-160 in mornings   Lab Results  Component Value Date   HGBA1C 8.4 (A) 06/02/2023   HGBA1C 8.0 (A) 02/23/2023   HGBA1C 8.9 (A) 11/07/2022    Feeling sick, productive cough and fevers x 2 weeks Has been using symbicort BID Occasionally feels shortness of breath at night but not currently Some sick contacts at home - granddaughter and daughter Also feeling some nausea Has had some decreased PO intake but able to keep down water Took Tylenol this morning     PERTINENT  PMH / PSH: COPD, hypertension, diabetes  OBJECTIVE:   BP (!) 163/64   Pulse 92   Temp 100.2 F (37.9 C) (Oral)   Ht 5\' 3"  (1.6 m)   Wt 189 lb 6 oz (85.9 kg)   SpO2 97%   BMI 33.55 kg/m   General: NAD, pleasant HEENT: Bilateral frontal and maxillary sinuses tender to palpation/percussion Cardiac: RRR, no murmurs auscultated Respiratory: CTAB, normal WOB, coughing Abdomen: soft, non-tender, non-distended Extremities: warm and well perfused, no edema or cyanosis Skin: warm and dry, no rashes noted Neuro: alert, no obvious focal deficits, speech normal Psych: Normal affect and mood  ASSESSMENT/PLAN:   Assessment & Plan DM type 2 with diabetic peripheral neuropathy (HCC) A1c is 8.4 today from 8.0 in November of last year.  Currently taking Lantus 18 units daily as well as Ozempic 0.5 mg weekly.  Will plan to increase Ozempic to 1 mg weekly, however given her current illness with nausea I will hold off on doing this until her symptoms resolved.  Advised 2 to 4-week follow-up at which  time we will likely increase Ozempic dose Cough with fever Negative for COVID and flu. 100.9F today, took Tylenol earlier. Given duration of her symptoms with fevers, productive cough, sinus tenderness, and history of COPD I have concern for underlying sinusitis and/or pneumonia.  Treat with Augmentin 875 twice daily x 5 days, prednisone 40 mg x 5 days given COPD.  Also sent Zofran for her nausea.  No focal findings on lung exam but will obtain chest x-ray.  Hypertensive today likely in the setting of her cough and illness, follow-up at next visit  Reviewed echo findings - Follow-up with cardiology scheduled 2/13 for preop visit  [name], MD [facility]

## 2023-06-02 ENCOUNTER — Ambulatory Visit: Payer: Medicare HMO | Admitting: Family Medicine

## 2023-06-02 ENCOUNTER — Encounter: Payer: Self-pay | Admitting: Family Medicine

## 2023-06-02 VITALS — BP 163/64 | HR 92 | Temp 100.2°F | Ht 63.0 in | Wt 189.4 lb

## 2023-06-02 DIAGNOSIS — E1142 Type 2 diabetes mellitus with diabetic polyneuropathy: Secondary | ICD-10-CM | POA: Diagnosis not present

## 2023-06-02 DIAGNOSIS — R509 Fever, unspecified: Secondary | ICD-10-CM | POA: Diagnosis not present

## 2023-06-02 DIAGNOSIS — R059 Cough, unspecified: Secondary | ICD-10-CM | POA: Diagnosis not present

## 2023-06-02 DIAGNOSIS — J189 Pneumonia, unspecified organism: Secondary | ICD-10-CM

## 2023-06-02 DIAGNOSIS — J329 Chronic sinusitis, unspecified: Secondary | ICD-10-CM | POA: Diagnosis not present

## 2023-06-02 LAB — POC SOFIA 2 FLU + SARS ANTIGEN FIA
Influenza A, POC: NEGATIVE
Influenza B, POC: NEGATIVE
SARS Coronavirus 2 Ag: NEGATIVE

## 2023-06-02 LAB — POCT GLYCOSYLATED HEMOGLOBIN (HGB A1C): HbA1c, POC (controlled diabetic range): 8.4 % — AB (ref 0.0–7.0)

## 2023-06-02 MED ORDER — AMOXICILLIN-POT CLAVULANATE 875-125 MG PO TABS: 1.0000 | ORAL_TABLET | Freq: Two times a day (BID) | ORAL | 0 refills | Status: DC

## 2023-06-02 MED ORDER — ONDANSETRON 4 MG PO TBDP
4.0000 mg | ORAL_TABLET | Freq: Three times a day (TID) | ORAL | 0 refills | Status: DC | PRN
Start: 2023-06-02 — End: 2023-07-16

## 2023-06-02 MED ORDER — PREDNISONE 20 MG PO TABS: 40.0000 mg | ORAL_TABLET | Freq: Every day | ORAL | 0 refills | Status: DC

## 2023-06-02 MED ORDER — SEMAGLUTIDE(0.25 OR 0.5MG/DOS) 2 MG/1.5ML ~~LOC~~ SOPN: 0.5000 mg | PEN_INJECTOR | SUBCUTANEOUS | 0 refills | Status: DC

## 2023-06-02 NOTE — Patient Instructions (Addendum)
Please take Augmentin twice daily for the next 5 days  Please take prednisone daily for the next 5 days  Zofran as needed for nausea  Please go to [facility] to get an x-ray at your earliest convenience  I have sent a refill of your Ozempic to your pharmacy.  Please continue to use this and follow-up in 2 to 4 weeks to revisit your diabetes and blood pressure  You tested negative for COVID and flu

## 2023-06-02 NOTE — Assessment & Plan Note (Signed)
A1c is 8.4 today from 8.0 in November of last year.  Currently taking Lantus 18 units daily as well as Ozempic 0.5 mg weekly.  Will plan to increase Ozempic to 1 mg weekly, however given her current illness with nausea I will hold off on doing this until her symptoms resolved.  Advised 2 to 4-week follow-up at which time we will likely increase Ozempic dose

## 2023-06-04 ENCOUNTER — Encounter: Payer: Self-pay | Admitting: Adult Health

## 2023-06-04 ENCOUNTER — Ambulatory Visit: Payer: Medicare HMO | Attending: Adult Health | Admitting: Adult Health

## 2023-06-04 VITALS — BP 140/72 | HR 72 | Ht 63.0 in | Wt 188.6 lb

## 2023-06-04 DIAGNOSIS — I5032 Chronic diastolic (congestive) heart failure: Secondary | ICD-10-CM

## 2023-06-04 DIAGNOSIS — Z01818 Encounter for other preprocedural examination: Secondary | ICD-10-CM | POA: Diagnosis not present

## 2023-06-04 DIAGNOSIS — I10 Essential (primary) hypertension: Secondary | ICD-10-CM | POA: Diagnosis not present

## 2023-06-04 MED ORDER — CARVEDILOL 25 MG PO TABS: 25.0000 mg | ORAL_TABLET | Freq: Two times a day (BID) | ORAL | 3 refills | Status: DC

## 2023-06-04 NOTE — Patient Instructions (Signed)
Medication Instructions:  No Changes *If you need a refill on your cardiac medications before your next appointment, please call your pharmacy*   Lab Work: No Labs If you have labs (blood work) drawn today and your tests are completely normal, you will receive your results only by: MyChart Message (if you have MyChart) OR A paper copy in the mail If you have any lab test that is abnormal or we need to change your treatment, we will call you to review the results.   Testing/Procedures: No Testing   Follow-Up: At [facility], you and your health needs are our priority.  As part of our continuing mission to provide you with exceptional heart care, we have created designated Provider Care Teams.  These Care Teams include your primary Cardiologist (physician) and Advanced Practice Providers (APPs -  Physician Assistants and Nurse Practitioners) who all work together to provide you with the care you need, when you need it.  We recommend signing up for the patient portal called "MyChart".  Sign up information is provided on this After Visit Summary.  MyChart is used to connect with patients for Virtual Visits (Telemedicine).  Patients are able to view lab/test results, encounter notes, upcoming appointments, etc.  Non-urgent messages can be sent to your provider as well.   To learn more about what you can do with MyChart, go to [email or web address].    Your next appointment:   6 month(s)  Provider:   Chrystie Nose, MD

## 2023-06-07 ENCOUNTER — Emergency Department (HOSPITAL_COMMUNITY): Payer: Medicare HMO

## 2023-06-07 ENCOUNTER — Observation Stay (HOSPITAL_COMMUNITY)
Admission: EM | Admit: 2023-06-07 | Discharge: 2023-06-09 | Disposition: A | Discharge status: Home / Self Care | Payer: Medicare HMO

## 2023-06-07 ENCOUNTER — Other Ambulatory Visit: Payer: Self-pay

## 2023-06-07 ENCOUNTER — Encounter (HOSPITAL_COMMUNITY): Payer: Self-pay

## 2023-06-07 DIAGNOSIS — R109 Unspecified abdominal pain: Secondary | ICD-10-CM | POA: Diagnosis not present

## 2023-06-07 DIAGNOSIS — I10 Essential (primary) hypertension: Secondary | ICD-10-CM | POA: Diagnosis not present

## 2023-06-07 DIAGNOSIS — R935 Abnormal findings on diagnostic imaging of other abdominal regions, including retroperitoneum: Secondary | ICD-10-CM | POA: Diagnosis not present

## 2023-06-07 DIAGNOSIS — Z8673 Personal history of transient ischemic attack (TIA), and cerebral infarction without residual deficits: Secondary | ICD-10-CM | POA: Diagnosis not present

## 2023-06-07 DIAGNOSIS — F17210 Nicotine dependence, cigarettes, uncomplicated: Secondary | ICD-10-CM | POA: Insufficient documentation

## 2023-06-07 DIAGNOSIS — N2889 Other specified disorders of kidney and ureter: Secondary | ICD-10-CM | POA: Insufficient documentation

## 2023-06-07 DIAGNOSIS — Z794 Long term (current) use of insulin: Secondary | ICD-10-CM | POA: Diagnosis not present

## 2023-06-07 DIAGNOSIS — Z7982 Long term (current) use of aspirin: Secondary | ICD-10-CM | POA: Diagnosis not present

## 2023-06-07 DIAGNOSIS — J449 Chronic obstructive pulmonary disease, unspecified: Secondary | ICD-10-CM | POA: Insufficient documentation

## 2023-06-07 DIAGNOSIS — R2689 Other abnormalities of gait and mobility: Secondary | ICD-10-CM | POA: Diagnosis not present

## 2023-06-07 DIAGNOSIS — N289 Disorder of kidney and ureter, unspecified: Secondary | ICD-10-CM | POA: Insufficient documentation

## 2023-06-07 DIAGNOSIS — Z20822 Contact with and (suspected) exposure to covid-19: Secondary | ICD-10-CM | POA: Diagnosis not present

## 2023-06-07 DIAGNOSIS — J189 Pneumonia, unspecified organism: Principal | ICD-10-CM | POA: Diagnosis present

## 2023-06-07 DIAGNOSIS — R079 Chest pain, unspecified: Secondary | ICD-10-CM | POA: Diagnosis not present

## 2023-06-07 DIAGNOSIS — R059 Cough, unspecified: Secondary | ICD-10-CM | POA: Diagnosis not present

## 2023-06-07 DIAGNOSIS — E876 Hypokalemia: Secondary | ICD-10-CM | POA: Diagnosis not present

## 2023-06-07 DIAGNOSIS — Z91040 Latex allergy status: Secondary | ICD-10-CM | POA: Insufficient documentation

## 2023-06-07 DIAGNOSIS — R0602 Shortness of breath: Secondary | ICD-10-CM | POA: Diagnosis present

## 2023-06-07 DIAGNOSIS — J984 Other disorders of lung: Secondary | ICD-10-CM | POA: Diagnosis not present

## 2023-06-07 DIAGNOSIS — K219 Gastro-esophageal reflux disease without esophagitis: Secondary | ICD-10-CM

## 2023-06-07 DIAGNOSIS — J9811 Atelectasis: Secondary | ICD-10-CM | POA: Diagnosis not present

## 2023-06-07 DIAGNOSIS — K209 Esophagitis, unspecified without bleeding: Secondary | ICD-10-CM

## 2023-06-07 LAB — HEPATIC FUNCTION PANEL
ALT: 16 U/L (ref 0–44)
AST: 16 U/L (ref 15–41)
Albumin: 3 g/dL — ABNORMAL LOW (ref 3.5–5.0)
Alkaline Phosphatase: 53 U/L (ref 38–126)
Bilirubin, Direct: 0.1 mg/dL (ref 0.0–0.2)
Indirect Bilirubin: 0.5 mg/dL (ref 0.3–0.9)
Total Bilirubin: 0.6 mg/dL (ref 0.0–1.2)
Total Protein: 7.5 g/dL (ref 6.5–8.1)

## 2023-06-07 LAB — RESPIRATORY PANEL BY PCR

## 2023-06-07 LAB — BASIC METABOLIC PANEL
Anion gap: 13 (ref 5–15)
BUN: 11 mg/dL (ref 8–23)
CO2: 26 mmol/L (ref 22–32)
Calcium: 8.4 mg/dL — ABNORMAL LOW (ref 8.9–10.3)
Chloride: 99 mmol/L (ref 98–111)
Creatinine, Ser: 0.87 mg/dL (ref 0.44–1.00)
GFR, Estimated: >60 mL/min (ref >60)
Glucose, Bld: 230 mg/dL — ABNORMAL HIGH (ref 70–99)
Potassium: 3.2 mmol/L — ABNORMAL LOW (ref 3.5–5.1)
Sodium: 138 mmol/L (ref 135–145)

## 2023-06-07 LAB — TROPONIN I (HIGH SENSITIVITY)
Troponin I (High Sensitivity): 62 ng/L — ABNORMAL HIGH (ref <18)
Troponin I (High Sensitivity): 67 ng/L — ABNORMAL HIGH (ref <18)
Troponin I (High Sensitivity): 67 ng/L — ABNORMAL HIGH (ref <18)

## 2023-06-07 LAB — RESP PANEL BY RT-PCR (RSV, FLU A&B, COVID)  RVPGX2
Influenza A by PCR: NEGATIVE
Influenza B by PCR: NEGATIVE
Resp Syncytial Virus by PCR: NEGATIVE
SARS Coronavirus 2 by RT PCR: NEGATIVE

## 2023-06-07 LAB — HEMOGLOBIN A1C
Hgb A1c MFr Bld: 8.3 % — ABNORMAL HIGH (ref 4.8–5.6)
Mean Plasma Glucose: 191.51 mg/dL

## 2023-06-07 LAB — CBC
HCT: 39.3 % (ref 36.0–46.0)
Hemoglobin: 12.8 g/dL (ref 12.0–15.0)
MCH: 28.1 pg (ref 26.0–34.0)
MCHC: 32.6 g/dL (ref 30.0–36.0)
MCV: 86.2 fL (ref 80.0–100.0)
Platelets: 307 10*3/uL (ref 150–400)
RBC: 4.56 MIL/uL (ref 3.87–5.11)
RDW: 13.4 % (ref 11.5–15.5)
WBC: 9.3 10*3/uL (ref 4.0–10.5)
nRBC: 0 % (ref 0.0–0.2)

## 2023-06-07 LAB — LIPASE, BLOOD: Lipase: 26 U/L (ref 11–51)

## 2023-06-07 MED ORDER — ENOXAPARIN SODIUM 40 MG/0.4ML IJ SOSY
40.0000 mg | PREFILLED_SYRINGE | INTRAMUSCULAR | Status: DC
  Administered 2023-06-07 – 2023-06-08 (×2): 40 mg via SUBCUTANEOUS
  Filled 2023-06-07 (×2): qty 0.4

## 2023-06-07 MED ORDER — ACETAMINOPHEN 500 MG PO TABS
1000.0000 mg | ORAL_TABLET | Freq: Three times a day (TID) | ORAL | Status: DC | PRN
  Administered 2023-06-08: 1000 mg via ORAL
  Filled 2023-06-07: qty 2

## 2023-06-07 MED ORDER — SODIUM CHLORIDE 0.9 % IV SOLN
1.0000 g | Freq: Once | INTRAVENOUS | Status: AC
  Administered 2023-06-07: 1 g via INTRAVENOUS
  Filled 2023-06-07: qty 10

## 2023-06-07 MED ORDER — GABAPENTIN 300 MG PO CAPS
600.0000 mg | ORAL_CAPSULE | Freq: Three times a day (TID) | ORAL | Status: DC
  Administered 2023-06-07 – 2023-06-09 (×5): 600 mg via ORAL
  Filled 2023-06-07 (×5): qty 2

## 2023-06-07 MED ORDER — INSULIN ASPART 100 UNIT/ML IJ SOLN
0.0000 [IU] | Freq: Three times a day (TID) | INTRAMUSCULAR | Status: DC
  Administered 2023-06-08: 5 [IU] via SUBCUTANEOUS
  Administered 2023-06-08: 8 [IU] via SUBCUTANEOUS

## 2023-06-07 MED ORDER — SODIUM CHLORIDE 0.9 % IV BOLUS
1000.0000 mL | Freq: Once | INTRAVENOUS | Status: AC
  Administered 2023-06-07: 1000 mL via INTRAVENOUS

## 2023-06-07 MED ORDER — QUETIAPINE FUMARATE ER 200 MG PO TB24
200.0000 mg | ORAL_TABLET | Freq: Every day | ORAL | Status: DC
  Administered 2023-06-08 (×2): 200 mg via ORAL
  Filled 2023-06-07 (×3): qty 1

## 2023-06-07 MED ORDER — HYDROXYZINE HCL 25 MG PO TABS: 25.0000 mg | ORAL_TABLET | Freq: Three times a day (TID) | ORAL | Status: DC | PRN

## 2023-06-07 MED ORDER — BENZONATATE 100 MG PO CAPS
100.0000 mg | ORAL_CAPSULE | Freq: Three times a day (TID) | ORAL | Status: DC | PRN
  Administered 2023-06-07 – 2023-06-08 (×2): 100 mg via ORAL
  Filled 2023-06-07 (×2): qty 1

## 2023-06-07 MED ORDER — IOHEXOL 350 MG/ML SOLN
75.0000 mL | Freq: Once | INTRAVENOUS | Status: AC | PRN
  Administered 2023-06-07: 75 mL via INTRAVENOUS

## 2023-06-07 MED ORDER — IPRATROPIUM-ALBUTEROL 0.5-2.5 (3) MG/3ML IN SOLN
3.0000 mL | Freq: Once | RESPIRATORY_TRACT | Status: AC
  Administered 2023-06-07: 3 mL via RESPIRATORY_TRACT
  Filled 2023-06-07: qty 3

## 2023-06-07 MED ORDER — METOCLOPRAMIDE HCL 5 MG PO TABS
5.0000 mg | ORAL_TABLET | Freq: Three times a day (TID) | ORAL | Status: DC
  Administered 2023-06-08 – 2023-06-09 (×4): 5 mg via ORAL
  Filled 2023-06-07 (×4): qty 1

## 2023-06-07 MED ORDER — SODIUM CHLORIDE 0.9 % IV SOLN
500.0000 mg | Freq: Once | INTRAVENOUS | Status: AC
  Administered 2023-06-07: 500 mg via INTRAVENOUS
  Filled 2023-06-07: qty 5

## 2023-06-07 MED ORDER — LACTATED RINGERS IV SOLN
INTRAVENOUS | Status: DC
Start: 2023-06-07 — End: 2023-06-08

## 2023-06-07 MED ORDER — AMLODIPINE BESYLATE 10 MG PO TABS
10.0000 mg | ORAL_TABLET | Freq: Every day | ORAL | Status: DC
  Administered 2023-06-07 – 2023-06-09 (×3): 10 mg via ORAL
  Filled 2023-06-07 (×3): qty 1

## 2023-06-07 MED ORDER — SODIUM CHLORIDE 0.9 % IV SOLN: 1.0000 g | INTRAVENOUS | Status: DC

## 2023-06-07 MED ORDER — ROSUVASTATIN CALCIUM 20 MG PO TABS
40.0000 mg | ORAL_TABLET | Freq: Every day | ORAL | Status: DC
  Administered 2023-06-08 – 2023-06-09 (×2): 40 mg via ORAL
  Filled 2023-06-07 (×2): qty 2

## 2023-06-07 MED ORDER — IPRATROPIUM-ALBUTEROL 0.5-2.5 (3) MG/3ML IN SOLN
3.0000 mL | Freq: Four times a day (QID) | RESPIRATORY_TRACT | Status: DC | PRN
  Administered 2023-06-08: 3 mL via RESPIRATORY_TRACT
  Filled 2023-06-07: qty 3

## 2023-06-07 MED ORDER — AZITHROMYCIN 250 MG PO TABS
500.0000 mg | ORAL_TABLET | Freq: Every day | ORAL | Status: DC
  Administered 2023-06-08: 500 mg via ORAL
  Filled 2023-06-07: qty 2

## 2023-06-07 MED ORDER — ASPIRIN 81 MG PO TBEC
81.0000 mg | DELAYED_RELEASE_TABLET | Freq: Every day | ORAL | Status: DC
  Administered 2023-06-07 – 2023-06-09 (×3): 81 mg via ORAL
  Filled 2023-06-07 (×3): qty 1

## 2023-06-07 MED ORDER — LOSARTAN POTASSIUM 25 MG PO TABS
25.0000 mg | ORAL_TABLET | Freq: Every day | ORAL | Status: DC
  Administered 2023-06-08 – 2023-06-09 (×2): 25 mg via ORAL
  Filled 2023-06-07 (×2): qty 1

## 2023-06-07 MED ORDER — ONDANSETRON HCL 4 MG/2ML IJ SOLN: 4.0000 mg | Freq: Three times a day (TID) | INTRAMUSCULAR | Status: DC | PRN

## 2023-06-07 MED ORDER — LORATADINE 10 MG PO TABS
10.0000 mg | ORAL_TABLET | Freq: Every day | ORAL | Status: DC
  Administered 2023-06-08 – 2023-06-09 (×2): 10 mg via ORAL
  Filled 2023-06-07 (×2): qty 1

## 2023-06-07 MED ORDER — IPRATROPIUM-ALBUTEROL 0.5-2.5 (3) MG/3ML IN SOLN
3.0000 mL | Freq: Four times a day (QID) | RESPIRATORY_TRACT | Status: DC
  Administered 2023-06-07: 3 mL via RESPIRATORY_TRACT
  Filled 2023-06-07: qty 3

## 2023-06-07 MED ORDER — KETOROLAC TROMETHAMINE 15 MG/ML IJ SOLN
15.0000 mg | Freq: Three times a day (TID) | INTRAMUSCULAR | Status: DC | PRN
  Administered 2023-06-07 – 2023-06-08 (×2): 15 mg via INTRAVENOUS
  Filled 2023-06-07 (×2): qty 1

## 2023-06-07 MED ORDER — BRIMONIDINE TARTRATE 0.15 % OP SOLN
2.0000 [drp] | Freq: Two times a day (BID) | OPHTHALMIC | Status: DC
  Administered 2023-06-08 – 2023-06-09 (×4): 2 [drp] via OPHTHALMIC
  Filled 2023-06-07: qty 5

## 2023-06-07 MED ORDER — ONDANSETRON HCL 4 MG/2ML IJ SOLN
4.0000 mg | Freq: Once | INTRAMUSCULAR | Status: AC
  Administered 2023-06-07: 4 mg via INTRAVENOUS
  Filled 2023-06-07: qty 2

## 2023-06-07 MED ORDER — CARVEDILOL 25 MG PO TABS
25.0000 mg | ORAL_TABLET | Freq: Two times a day (BID) | ORAL | Status: DC
  Administered 2023-06-07 – 2023-06-09 (×4): 25 mg via ORAL
  Filled 2023-06-07 (×4): qty 1

## 2023-06-07 MED ORDER — SODIUM CHLORIDE 0.9 % IV SOLN: 2.0000 g | INTRAVENOUS | Status: DC

## 2023-06-07 MED ORDER — PREDNISONE 20 MG PO TABS
40.0000 mg | ORAL_TABLET | Freq: Every day | ORAL | Status: DC
  Administered 2023-06-08: 40 mg via ORAL
  Filled 2023-06-07: qty 2

## 2023-06-07 NOTE — Plan of Care (Signed)
Spoke with VVS about SMA plaque formation/mural thrombus.  Confirmed area is nonocclusive though about 40-50% stenosed.  This should not be causing any mesenteric ischemia.  However, they would like to have her follow-up outpatient for medical optimization.  They will put in for a follow-up with their office when she is discharged.  Greatly appreciate their assistance.

## 2023-06-07 NOTE — Plan of Care (Signed)
FMTS Interim Progress Note  Received signout from Dr. Rhae Hammock. 73yo F to be admitted primarily due to failure of outpatient therapy for pneumonia, also with some incidental findings on CT abd/pelvis. Stable on room air.  Given DuoNeb, Zofran, 1 L NS bolus.  Started on ceftriaxone and azithromycin.  FMTS to admit - will sign out patient to oncoming night team who will complete full admission this evening.  [name], MD 06/07/2023, 6:47 PM PGY-2, [facility] Family Medicine Service pager [phone]

## 2023-06-07 NOTE — Assessment & Plan Note (Signed)
As appreciated on CT abdomen and pelvis. Unlikely contributing to current presentation.  - Outpatient follow-up with MRI with and without contrast for definitive characterization

## 2023-06-07 NOTE — Hospital Course (Addendum)
[name] [name] is a 73 y.o. female with a history of type 2 diabetes, hypertension, esophagitis, recurrent pancreatitis who was admitted to the [facility] Medicine Teaching Service at [facility] for failure of outpatient management for CAP. Hospital course is outlined below by problem.   CAP Failed outpatient therapy with prednisone and Augmentin.  Presented with continued cough and SOB.  Diffuse wheezes and bronchospastic cough noted on exam with normal SpO2.  Respiratory panel negative. Chest x-ray with pleural effusion. CTAP with superimposed pneumonia.  She was started on IV azithromycin/ceftriaxone, and bronchodilators. At the time of discharge, she had finished a three day course of Azithromycin and transitioned to PO Cefuroxime (end 02/20). She was initially started on systemic steroids; however, these were discontinued because she had improved considerably prior to discharge.   Abdominal pain  Presented with diffuse abdominal pain accompanied by multiple episodes of nausea and vomiting. At times posttussive in nature. Abdomen diffusely tender with voluntary guarding to deep palpation on exam. Lipase and LFTs normal. CTAP with SMA mural thrombus versus soft plaque without total occlusion and thickening of the antral region consistent with possible gastritis/PUD. Vascular was consulted and did not feel SMA narrowing was the cause of her pain and there was no evidence of intestinal ischemia warranting acute intervention. They opted to follow up with her outpatient to optimize medical management going forward. Bowel regimen was added and pain was improved after BM. At the time of discharge, abdominal pain was minimal and managed with PO Tylenol which she was counseled to use at home for recurrence of pain. Additionally, her home Protonix was lowered to 40mg  daily.    Renal Mass:  1.5 cm x 0.9 cm complex lesion in the lower pole of the left kidney. Incidental finding on her CTAP performed due to her abdominal  pain. Radiology recommended follow up MRI w/wo contrast outpatient for further characterization.   Other conditions that were chronic and stable: T2DM (SSI initiated in the hospital), hypertension, anxiety/depression  Issues for follow up: Ensure follow up with VVS for superior mesenteric artery soft plaque vs mural thrombus narrowing MRI with and without contrast for left complex kidney lesion Consider H.pylori testing given symptomology and findings on CTAP  Follow up respiratory status, finishes Cefuroxime 02/20 Consider BMP to reevaluate lytes

## 2023-06-07 NOTE — ED Provider Triage Note (Signed)
Emergency Medicine Provider Triage Evaluation Note  [name] [name] , a 73 y.o. female  was evaluated in triage.  Pt complains of cough and shortness of breath.  Review of Systems  Positive: fever Negative: Vomiting   Physical Exam  BP 129/73 (BP Location: Right Arm)   Pulse 91   Temp 99 F (37.2 C)   Resp 20   Ht 5\' 3"  (1.6 m)   Wt 85.5 kg   SpO2 96%   BMI 33.39 kg/m  Gen:   Awake, no distress   Resp:  Normal effort  MSK:   Moves extremities without difficulty  Other:    Medical Decision Making  Medically screening exam initiated at 2:46 PM.  Appropriate orders placed.  [name] [name] was informed that the remainder of the evaluation will be completed by another provider, this initial triage assessment does not replace that evaluation, and the importance of remaining in the ED until their evaluation is complete.     [name] [name], [place] 06/07/23 1448

## 2023-06-07 NOTE — Assessment & Plan Note (Addendum)
Patient with ongoing complaints of abdominal pain, nausea, vomiting and limited PO intake. Abdominal exam benign upon assessment.  She does have a history of diabetic gastroparesis and constipation.  Has had recent loose stools during sickness.  CT notes small supraumbilical hernia that contains adipose tissue and chronic focus of fat necrosis similar from 2023, substantial soft plaque/mural thrombus narrowing of the SMA without overt occlusion, and wall thickening of the stomach antrum cannot exclude antritis/PUD.  Wonder if presentation is exacerbation of gastroparesis.  Less likely pancreatitis or gallbladder/biliary source given normal lipase and LFTs. However, given pain out of proportion to exam and evidence of SMA thrombus, mesenteric ischemia (even if intermittent) remains on the differential. - Consult VVS given abdominal pain out of proportion to exam findings with evidence of significant SMA thrombus - Tylenol 1000 mg q8h as needed  - Heat compress  - Toradol 15 mg x3 doses - Continue home Reglan - Consider adding back MiraLAX if loose stools resolving with current illness

## 2023-06-07 NOTE — ED Triage Notes (Signed)
Pt c/o midsternal chest pain, generalized abdominal pain, productive cough w/yellow mucous, nausea, diarrheax2.5wks.

## 2023-06-07 NOTE — Assessment & Plan Note (Addendum)
Pt w/ ongoing upper respiratory symptoms (cough, SOB, myalgias, fevers, chills)  the past 3 to 4 weeks. Suspect viral etiology initially, now possibly bacterial. Recent sick contacts with family members with similar symptoms. Patient ill appearing. Afebrile, vital signs relatively stable, and satting well on RA. CBC w/ leukocytosis.  RVP negative.  CXR w/ bilateral opacities suspicious for pneumonia. CT also with lower lobe opacity suggestive of superimposed pneumonia. Given ongoing symptoms after failing outpatient steroid and abx, will broaden abx due to concern of untreated pnumonia.  - Admit to FMTS, Dr. Linwood Dibbles  - Azithromycin ( 2/16-)  - Ceftriaxone (2/16 - )  - Prednisone 5 day course (2/17- )  - Duo nebs q6h for supportive treatment  - Continue maintenance IV fluids until PO improves - Maintain O2 sats > 88%  - f/u BCx - AM CBC and BMP

## 2023-06-07 NOTE — H&P (Cosign Needed Addendum)
Hospital Admission History and Physical Service Pager: [phone]  Patient name: [name] [name] Medical record number: [id number] Date of Birth: March 15, 1951 Age: 73 y.o. Gender: female  Primary Care Provider: [name], MD Consultants: None  Code Status: FULL, confirmed with patient Preferred Emergency Contact:   Contact Information     Name Relation Home Work Mobile   Campbell Granddaughter   [phone]   Willette Cluster Niece [phone]  [phone]   Jones,Margaret Sister [phone]  [phone]   Alba Cory [phone]  [phone]      Other Contacts   None on File     Chief Complaint: SOB, cough  Assessment and Plan: [name] [name] is a 73 y.o. female presenting with SOB and cough. Differential for presentation of this includes:   Pneumonia, patient with ongoing respiratory symptoms(cough, SOB, subjective fevers, chills, n/v, and diarrhea) 3-4 weeks. Limited PO intake. Family member sick, with similar symptoms.No improvement after 5 day course of steroids and Augmentin. CXR suggestive of infectious etiology. Afebrile, no leukocytosis, neg Respiratory panel.  2.   COPD exacerbation, less suspicious patient worsening cough and increased yellow sputumproduction. Non-labored breathing and patient satting well on room air.  3.  Viral URI, typical symptoms are present a duration to be less likely 4. Heart failure, less likely given normal echo and lack of edema 5.  Pulmonary embolism, timeline along with focal respiratory findings make this less likely 5.  Acute coronary syndrome, timeline along with reassuring EKG make this less likely, though trops are mildly elevated 6.   Blastomycosis, less suspicious given no recent travel. Respiratory symptoms ongoing and without improvement on abx and steroids.  7.   Histoplasma less suspicious given no recent travel. Respiratory symptoms ongoing and without improvement on abx and steroids.   Assessment &  Plan CAP (community acquired pneumonia) Pt w/ ongoing upper respiratory symptoms (cough, SOB, myalgias, fevers, chills)  the past 3 to 4 weeks. Suspect viral etiology initially, now possibly bacterial. Recent sick contacts with family members with similar symptoms. Patient ill appearing. Afebrile, vital signs relatively stable, and satting well on RA. CBC w/ leukocytosis.  RVP negative.  CXR w/ bilateral opacities suspicious for pneumonia. CT also with lower lobe opacity suggestive of superimposed pneumonia. Given ongoing symptoms after failing outpatient steroid and abx, will broaden abx due to concern of untreated pnumonia.  - Admit to [facility]MT[facility], Dr. Linwood Dibbles  - Azithromycin ( 2/16-)  - Ceftriaxone (2/16 - )  - Prednisone 5 day course (2/17- )  - Duo nebs q6h for supportive treatment  - Continue maintenance IV fluids until PO improves - Maintain O2 sats > 88%  - f/u BCx - AM CBC and BMP  Abdominal pain Patient with ongoing complaints of abdominal pain, nausea, vomiting and limited PO intake. Abdominal exam benign upon assessment.  She does have a history of diabetic gastroparesis and constipation.  Has had recent loose stools during sickness.  CT notes small supraumbilical hernia that contains adipose tissue and chronic focus of fat necrosis similar from 2023, substantial soft plaque/mural thrombus narrowing of the SMA without overt occlusion, and wall thickening of the stomach antrum cannot exclude antritis/PUD.  Wonder if presentation is exacerbation of gastroparesis.  Less likely pancreatitis or gallbladder/biliary source given normal lipase and LFTs. However, given pain out of proportion to exam and evidence of SMA thrombus, mesenteric ischemia (even if intermittent) remains on the differential. - Consult VVS given abdominal pain out of proportion to exam findings with evidence of significant SMA thrombus -  Tylenol 1000 mg q8h as needed  - Heat compress  - Toradol 15 mg x3 doses - Continue home  Reglan - Consider adding back MiraLAX if loose stools resolving with current illness Renal mass As appreciated on CT abdomen and pelvis.  Unlikely contributing to current presentation. - Outpatient follow-up with MRI with and without contrast for definitive characterization  Chronic and Stable Problems:   DM: Continue Heart/Carb modified diet. Holding home meds: Lantus 18 units daily and semaglutide. Will restart metoclopramide 5 mg TID w/ meals for gastroparesis  HLD: Continue Home Rosuvastatin 40 mg daily  HTN: Continue Home meds: carvedilol 25 mg BID, Losartan 25 mg daily, Amlodipine 10 mg daily  HFpEF: Continue Home meds COPD: Scheduled duo-nebs q6 hours Holding home meds.  GERD: Home Protonix 40 mg BID, hold for now  Anxiety: Continue home hydroxyzine 25 mg TID prn  Depression: Continue home meds: Seroquel 200 at bedtime, Mirtazapine ( Pt not taking)   Stroke: ASA 81 mg daily  Chronic Pain: Gabapentin 600 mg TID, Tylenol 1000 mg q8h as needed  FEN/GI: Heart healthy/carb-modified diet VTE Prophylaxis: Lovenox  Disposition: Med-surg  History of Present Illness:  [name] [name] is a 73 y.o. female presenting with SOB.  She has been having problems with breathing and her COPD over the last 3 weeks. She endorses cough, SOB, and chest pain. Her breathing is worse with movement and rest. She notes fever to 100.3, chills, and muscle aches over the period of sickness. She notes she was recently around her granddaughter who has been sick with similar symptoms. No recent travel. No increased swelling in her legs. She went to her PCP office, and was given antibiotics but she continued to get worse which prompted her attention to the ED.  She has also been having abdominal pain over the last 3-4 weeks, and she was told it was a hernia in her stomach earlier today that was the cause. She has had hernia repair before. She has not been able to hold down much food, but she has been keeping down  water and tea. She has also been having nausea, and she received zofran at her PCP office, but that did not help. The IV version helped today, though. She has been vomiting yellow-green emesis without blood.  In the ED, BMP was remarkable w/ K 3.2. CBC WBC 9.3, 12.8/39.3, Trops peaked at 67. CXR mild cardiomegaly, w/ bibasilar opacities. CT bilateral lobe opacities, lower lobe opacities suspicious for superimposed pneumonia. Soft plaque vs mural thrombus narrowing of SMA.   Review Of Systems: Per HPI.  Pertinent Past Medical History: HFpEF  HLD  HTN  COPD  DM  GERD Stroke   Chronic pain  Fibromyalgia  Depression  Anxiety  Remainder reviewed in history tab.   Pertinent Past Surgical History: Hysterectomy  Cholecystectomy  EGD (2013 and 2017)  Remainder reviewed in history tab.   Pertinent Social History: Tobacco use: 3-4 cigarettes a day when she is stressed Alcohol use: No Other Substance use: No  Pertinent Family History: Mother Stroke  Father Esophagus difficulties  Brother Prostatic cancer  Daughter MI  Remainder reviewed in history tab.   Important Outpatient Medications: Proventil  Symbicort  Amlodipine 10 mg daily Carvedilol 25 mg BID, only took AM dose, did not take any other medications today Losaratan 25 mg daily   ASA 81 daily  Gabapentin 300 mg 2 capsules TID  Hydroxyzine 25 mg TID as needed  Mirtazapine 7.5 mg at bedtime (not taking)  Seroquel  200 mg at bedtime  Lantus 18 units  Semaglutide 0.5 weekly Remainder reviewed in medication history.   Objective: BP (!) 166/101   Pulse 82   Temp 98.1 F (36.7 C) (Oral)   Resp (!) 22   Ht 5\' 3"  (1.6 m)   Wt 85.5 kg   SpO2 100%   BMI 33.39 kg/m  Exam: General: Ill appearing, cooperative, conversant Eyes: Non-scleral icterus  ENTM: dry mouth Neck: Grossly normal range motion  Cardiovascular: Regular Rate, No murmur appreciated  Respiratory: Non-labored breathing, coarse breath sounds in the left  lung fields, and diffuse expiratory wheezes throughout lung fields  Gastrointestinal: Normoactive bowel sounds, non-tender to light and deep palpation, soft, nondistended MSK: Grossly full ROM in all extremities Derm: No scars, erythema or signs of infection noted; dry skin of bilateral feet Neuro: Alert and Oriented to person, place and situation Psych: Appropriate affect and mood  Labs:  CBC BMET  Recent Labs  Lab 06/07/23 1424  WBC 9.3  HGB 12.8  HCT 39.3  PLT 307   Recent Labs  Lab 06/07/23 1424  NA 138  K 3.2*  CL 99  CO2 26  BUN 11  CREATININE 0.87  GLUCOSE 230*  CALCIUM 8.4*     Lipase 26 AST 16 ALT 16 Alk phos 53 Trops 62>67  EKG:  sinus HR 91 , LVH, intermittent PVCs, QTC 430   Imaging Studies Performed:  CXR:  1. Borderline cardiomegaly with a configuration suggestive of a pericardial effusion. 2. Mild bibasilar atelectasis/airspace disease. A small left pleural effusion may contribute.  CT AP w/ contrast   1. Circumferential wall thickening in the stomach antrum, cannot exclude antritis/peptic ulcer disease. 2. Patchy and bandlike airspace opacities in the right middle lobe, lingula, and especially both lower lobes with some airway plugging in the lower lobes. Some of the appearance is due to atelectasis but the lower lobe airspace opacities raise suspicion for superimposed pneumonia. 3. Mild cardiomegaly. Trace pericardial effusion. 4. Substantial soft plaque or mural thrombus narrowing of the superior mesenteric artery. No overt occlusion. 5. 1.5 by 0.9 cm hypodense but complex lesion of the left kidney lower pole measuring about 66 Hounsfield units on delayed. This indeterminate lesion could be a complex cyst or mass. Renal protocol MRI with and without contrast is recommended in the non-acute setting for definitive characterization. 6. Small supraumbilical hernia contains adipose tissue and a small rim calcified fatty structure compatible  with a chronic focus of fat necrosis. Similar appearance 09/23/2021. 7. Mid and lower lumbar degenerative facet arthropathy with multilevel impingement. 8. Chronic deformity of the left pubis related to old fracture. 9. Aortic atherosclerosis.  Peterson Ao, MD 06/07/2023, 7:31 PM PGY-1, [facility]  FPTS Intern pager: [phone], text pages welcome Secure chat group [facility] Teaching Service   I agree with the assessment and plan as documented above.  Janeal Holmes, MD PGY-2, [facility]

## 2023-06-07 NOTE — ED Provider Notes (Signed)
[place] [facility] AT [facility] Provider Note   CSN: [id number] Arrival date & time: 06/07/23  1403     History  Chief Complaint  Patient presents with   Chest Pain   Abdominal Pain    Jamilee Lafosse is a 73 y.o. female.  73 year old female with past medical history of diabetes, CVA, hypertension, and esophagitis presenting to the emergency department today with cough and shortness of breath.  The patient states he is also having some abdominal discomfort with this.  The patient has had multiple episodes of nausea and vomiting.  She states that a lot of times this is posttussive in nature but occasionally she will have vomiting without coughing.  She states that she is having diffuse abdominal pain now as well.  She went to see her primary care doctor and was started on steroids and Augmentin.  She has completed a 5-day course but her symptoms have come back and are worse.  She is coughing up a lot of yellow sputum.  She denies any fevers but has had some chills.  She denies any pleuritic pain.  She came to the ER today for further evaluation regarding this.  She does have some loose stools a few days ago but denies any currently.  She has been tested for COVID and flu and this was negative.   Chest Pain Associated symptoms: abdominal pain, nausea and vomiting   Abdominal Pain Associated symptoms: chest pain, nausea and vomiting        Home Medications Prior to Admission medications   Medication Sig Start Date End Date Taking? Authorizing Provider  acetaminophen (TYLENOL) 325 MG tablet Take 2 tablets (650 mg total) by mouth every 4 (four) hours as needed for mild pain (or Fever >/= 101). 03/15/18   Dollene Cleveland, DO  acetaminophen (TYLENOL) 500 MG tablet Take 1,000 mg by mouth every 8 (eight) hours as needed for mild pain (pain score 1-3).    [provider]  albuterol (PROVENTIL) (2.5 MG/3ML) 0.083% nebulizer solution Take 3 mLs (2.5 mg  total) by nebulization every 6 (six) hours as needed for wheezing or shortness of breath. 11/07/22   [name], MD  albuterol (VENTOLIN HFA) 108 (90 Base) MCG/ACT inhaler Inhale 2 puffs into the lungs every 6 (six) hours as needed for wheezing or shortness of breath. 03/13/23   McDiarmid, Leighton Roach, MD  amLODipine (NORVASC) 10 MG tablet TAKE 1 TABLET(10 MG) BY MOUTH DAILY 05/12/23   McDiarmid, Leighton Roach, MD  amoxicillin-clavulanate (AUGMENTIN) 875-125 MG tablet Take 1 tablet by mouth 2 (two) times daily for 5 days. 06/02/23 06/07/23  [name], MD  aspirin EC 81 MG tablet Take 81 mg by mouth daily.    [provider]  Aspirin-Acetaminophen-Caffeine (EXCEDRIN EXTRA STRENGTH PO) Take 1 tablet by mouth in the morning and at bedtime.    [provider]  bisacodyl (DULCOLAX) 5 MG EC tablet Take 5 mg by mouth daily as needed for moderate constipation.    [provider]  Blood Glucose Monitoring Suppl (ACCU-CHEK GUIDE) w/Device KIT 1 Device by Does not apply route 4 (four) times daily. 01/05/23   [name], MD  brimonidine (ALPHAGAN) 0.15 % ophthalmic solution Place 2 drops into both eyes 2 (two) times daily.    [provider]  budesonide (PULMICORT) 0.25 MG/2ML nebulizer solution Take 2 mLs (0.25 mg total) by nebulization 2 (two) times daily. Patient not taking: Reported on 06/04/2023 09/30/21   Moses Manners, MD  budesonide-formoterol (SYMBICORT) 80-4.5 MCG/ACT inhaler Inhale 2 puffs into the lungs 2 (two) times daily. 03/13/23   [name], MD  Carboxymethylcellulose Sodium (ARTIFICIAL TEARS OP) Apply 2 drops to eye daily as needed.    [provider]  carvedilol (COREG) 25 MG tablet Take 1 tablet (25 mg total) by mouth 2 (two) times daily. 06/04/23 09/02/23  Jodelle Gross, NP  cetirizine (ZYRTEC) 10 MG tablet Take 1 tablet (10 mg total) by mouth daily. 11/07/22   [name], MD  clobetasol ointment (TEMOVATE) 0.05 % Apply 1 application  topically  2 (two) times daily. Apply under nails 09/30/21   Hensel, Santiago Bumpers, MD  Continuous Glucose Receiver (FREESTYLE LIBRE 3 READER) DEVI 1 Device by Combination route See admin instructions. 03/13/23   [provider]  Continuous Glucose Sensor (FREESTYLE LIBRE 3 SENSOR) MISC Place 1 sensor on the skin every 14 days. Use to check glucose continuously 05/12/23   McDiarmid, Leighton Roach, MD  dextromethorphan-guaiFENesin [facility] DM) 30-600 MG 12hr tablet Take 1 tablet by mouth 2 (two) times daily. 01/03/20   Wieters, Hallie C, PA-C  diclofenac Sodium (VOLTAREN) 1 % GEL Apply 4 g topically 4 (four) times daily as needed. 01/16/22   Littie Deeds, MD  dicyclomine (BENTYL) 10 MG capsule Take 1 capsule (10 mg total) by mouth every 8 (eight) hours as needed for spasms. Patient not taking: Reported on 03/13/2023 03/28/21   Benancio Deeds, MD  docusate sodium (COLACE) 100 MG capsule Take 100 mg by mouth 2 (two) times daily as needed for moderate constipation. Patient not taking: Reported on 03/27/2023    [provider]  dorzolamide (TRUSOPT) 2 % ophthalmic solution Place 2 drops into both eyes 2 (two) times daily.    [provider]  fluticasone (FLONASE) 50 MCG/ACT nasal spray Place 2 sprays into both nostrils 2 (two) times daily. 05/29/17   [provider]  gabapentin (NEURONTIN) 300 MG capsule TAKE 2 CAPSULES BY MOUTH 3 TIMES DAILY 02/12/23   [name], MD  glucose blood (ACCU-CHEK GUIDE) test strip Use as instructed to test sugars 3 x daily Dx code: E11.42 01/20/23   [name], MD  hydrOXYzine (ATARAX) 25 MG tablet Take 1 tablet (25 mg total) by mouth 3 (three) times daily as needed. 08/15/22   Littie Deeds, MD  insulin glargine (LANTUS SOLOSTAR) 100 UNIT/ML Solostar Pen Inject 18 Units into the skin daily. 02/23/23   [name], MD  Insulin Pen Needle (BD PEN NEEDLE NANO 2ND GEN) 32G X 4 MM MISC Inject 1 Container into the skin as needed. 03/27/23   McDiarmid, Leighton Roach, MD   Insulin Pen Needle 32G X 4 MM MISC 1 Container by Does not apply route daily as needed (Injection twice daily).    [provider]  ipratropium (ATROVENT) 0.03 % nasal spray Place 2 sprays into both nostrils 3 (three) times daily. Patient not taking: Reported on 10/14/2022 12/05/20   Littie Deeds, MD  Lancet Devices (ONE TOUCH DELICA LANCING DEV) MISC 1 Device by Does not apply route 4 (four) times daily. 01/20/23   [name], MD  linaclotide Karlene Einstein) 145 MCG CAPS capsule Take 1 capsule (145 mcg total) by mouth daily before breakfast. Patient not taking: Reported on 03/27/2023 08/28/22   Esterwood, Amy S, PA-C  losartan (COZAAR) 25 MG tablet Take 1 tablet (25 mg total) by mouth daily. 05/12/23   McDiarmid, Leighton Roach, MD  metoCLOPramide (REGLAN) 5 MG tablet Take 1 tablet (5 mg total) by  mouth 3 (three) times daily before meals. 08/28/22   Esterwood, Amy S, PA-C  mirtazapine (REMERON) 7.5 MG tablet Take 1 tablet (7.5 mg total) by mouth at bedtime. Patient not taking: Reported on 02/27/2023 07/07/22   Littie Deeds, MD  nystatin (MYCOSTATIN) 100000 UNIT/ML suspension Take 2 mLs (200,000 Units total) by mouth 4 (four) times daily. Apply 1mL to each cheek 03/27/23   McDiarmid, Leighton Roach, MD  nystatin (MYCOSTATIN/NYSTOP) powder Apply topically 3 (three) times daily. To affected area 03/27/23   McDiarmid, Leighton Roach, MD  ondansetron (ZOFRAN-ODT) 4 MG disintegrating tablet Take 1 tablet (4 mg total) by mouth every 8 (eight) hours as needed for nausea. 06/02/23   [name], MD  oxybutynin (DITROPAN-XL) 10 MG 24 hr tablet Take 10 mg by mouth daily. 10/15/21   [provider]  pantoprazole (PROTONIX) 40 MG tablet Take 1 tablet (40 mg total) by mouth 2 (two) times daily. 08/28/22   Esterwood, Amy S, PA-C  Polyethyl Glycol-Propyl Glycol (SYSTANE OP) Apply 2 drops to eye 2 (two) times daily.    [provider]  polyethylene glycol (MIRALAX) 17 g packet Take 17 g by mouth 2 (two) times daily. Increase as  needed Patient not taking: Reported on 03/13/2023 03/28/21   Benancio Deeds, MD  predniSONE (DELTASONE) 20 MG tablet Take 2 tablets (40 mg total) by mouth daily with breakfast for 5 days. 06/02/23 06/07/23  [name], MD  QUEtiapine (SEROQUEL XR) 200 MG 24 hr tablet Take 1 tablet (200 mg total) by mouth at bedtime. 03/13/23   McDiarmid, Leighton Roach, MD  rosuvastatin (CRESTOR) 40 MG tablet TAKE 1 TABLET(40 MG) BY MOUTH DAILY 09/28/20   Maness, Loistine Chance, MD  Semaglutide,0.25 or 0.5MG /DOS, 2 MG/1.5ML SOPN Inject 0.5 mg into the skin once a week. 0.25 mg once weekly for 4 weeks then increase to 0.5 mg weekly for at least 4 weeks,max 1 mg 06/02/23   [name], MD  terbinafine (LAMISIL) 1 % cream Apply 1 Application topically 2 (two) times daily. Apply under belly 03/27/23   McDiarmid, Leighton Roach, MD  triamcinolone ointment (KENALOG) 0.1 % Apply 1 Application topically 2 (two) times daily. 03/27/23   McDiarmid, Leighton Roach, MD  TRUEplus Lancets 33G MISC TEST BLOOD SUGAR 3 TO 4 TIMES DAILY 06/23/22   Littie Deeds, MD      Allergies    Desvenlafaxine, Duloxetine, Latex, Levofloxacin, and Lithium    Review of Systems   Review of Systems  Cardiovascular:  Positive for chest pain.  Gastrointestinal:  Positive for abdominal pain, nausea and vomiting.  All other systems reviewed and are negative.   Physical Exam Updated Vital Signs BP (!) 182/68   Pulse 80   Temp 98.1 F (36.7 C) (Oral)   Resp 16   Ht 5\' 3"  (1.6 m)   Wt 85.5 kg   SpO2 100%   BMI 33.39 kg/m  Physical Exam Vitals and nursing note reviewed.   Gen: NAD Eyes: PERRL, EOMI HEENT: no oropharyngeal swelling, dry mucous membranes Neck: trachea midline Resp: Diminished with diffuse wheezes and bronchospastic cough noted Card: RRR, no murmurs, rubs, or gallops Abd: Diffusely tender with voluntary guarding to deep palpation,, the abdomen is soft Extremities: no calf tenderness, no edema Vascular: 2+ radial pulses bilaterally, 2+ DP pulses  bilaterally Skin: no rashes Psyc: acting appropriately   ED Results / Procedures / Treatments   Labs (all labs ordered are listed, but only abnormal results are displayed) Labs Reviewed  BASIC METABOLIC PANEL -  Abnormal; Notable for the following components:      Result Value   Potassium 3.2 (*)    Glucose, Bld 230 (*)    Calcium 8.4 (*)    All other components within normal limits  HEPATIC FUNCTION PANEL - Abnormal; Notable for the following components:   Albumin 3.0 (*)    All other components within normal limits  TROPONIN I (HIGH SENSITIVITY) - Abnormal; Notable for the following components:   Troponin I (High Sensitivity) 62 (*)    All other components within normal limits  TROPONIN I (HIGH SENSITIVITY) - Abnormal; Notable for the following components:   Troponin I (High Sensitivity) 67 (*)    All other components within normal limits  RESP PANEL BY RT-PCR (RSV, FLU A&B, COVID)  [id number]  CULTURE, BLOOD (ROUTINE X 2)  CULTURE, BLOOD (ROUTINE X 2)  CBC  LIPASE, BLOOD  URINALYSIS, ROUTINE W REFLEX MICROSCOPIC    EKG None  Radiology CT ABDOMEN PELVIS W CONTRAST Result Date: 06/07/2023 CLINICAL DATA:  Abdominal and mid sternal chest pain. Productive cough. Nausea. Diarrhea. EXAM: CT ABDOMEN AND PELVIS WITH CONTRAST TECHNIQUE: Multidetector CT imaging of the abdomen and pelvis was performed using the standard protocol following bolus administration of intravenous contrast. RADIATION DOSE REDUCTION: This exam was performed according to the departmental dose-optimization program which includes automated exposure control, adjustment of the mA and/or kV according to patient size and/or use of iterative reconstruction technique. CONTRAST:  75mL OMNIPAQUE IOHEXOL 350 MG/ML SOLN COMPARISON:  09/23/2021 and chest radiograph 06/07/2023 FINDINGS: Lower chest: Mild cardiomegaly. Trace pericardial effusion. Patchy and bandlike airspace opacities in the right middle lobe, lingula, and especially  both lower lobes with some airway plugging in the lower lobes. Some of the appearance is due to atelectasis but the lower lobe airspace opacities raise suspicion for superimposed pneumonia. Hepatobiliary: Cholecystectomy.  Otherwise unremarkable. Pancreas: Unremarkable Spleen: Unremarkable Adrenals/Urinary Tract: 1.5 by 0.9 cm hypodense but complex lesion of the left kidney lower pole measuring about 66 Hounsfield units on delayed image 23 series 6. This indeterminate lesion could be a complex cyst or mass. Renal protocol MRI with and without contrast is recommended for definitive characterization. Stomach/Bowel: Circumferential wall thickening in the stomach antrum, cannot exclude antritis/peptic ulcer disease. Duodenum unremarkable. Vascular/Lymphatic: Atherosclerosis is present, including aortoiliac atherosclerotic disease. Substantial soft plaque or mural thrombus narrowing of the superior mesenteric artery on image 30 series 3. No overt occlusion. Atheromatous plaque noted proximally in the celiac trunk as well. No pathologic adenopathy. Reproductive: Uterus absent.  Adnexa unremarkable. Other: No supplemental non-categorized findings. Musculoskeletal: Small supraumbilical hernia contains adipose tissue and a small rim calcified fatty structure compatible with a chronic focus of fat necrosis. Similar appearance 09/23/2021. Chronic deformity of the left pubis related to old fracture. Mid and lower lumbar degenerative facet arthropathy with multilevel impingement. IMPRESSION: 1. Circumferential wall thickening in the stomach antrum, cannot exclude antritis/peptic ulcer disease. 2. Patchy and bandlike airspace opacities in the right middle lobe, lingula, and especially both lower lobes with some airway plugging in the lower lobes. Some of the appearance is due to atelectasis but the lower lobe airspace opacities raise suspicion for superimposed pneumonia. 3. Mild cardiomegaly. Trace pericardial effusion. 4.  Substantial soft plaque or mural thrombus narrowing of the superior mesenteric artery. No overt occlusion. 5. 1.5 by 0.9 cm hypodense but complex lesion of the left kidney lower pole measuring about 66 Hounsfield units on delayed. This indeterminate lesion could be a complex cyst or mass. Renal protocol MRI  with and without contrast is recommended in the non-acute setting for definitive characterization. 6. Small supraumbilical hernia contains adipose tissue and a small rim calcified fatty structure compatible with a chronic focus of fat necrosis. Similar appearance 09/23/2021. 7. Mid and lower lumbar degenerative facet arthropathy with multilevel impingement. 8. Chronic deformity of the left pubis related to old fracture. 9. Aortic atherosclerosis. Aortic Atherosclerosis (ICD10-I70.0). Electronically Signed   By: Gaylyn Rong M.D.   On: 06/07/2023 17:59   DG Chest 2 View Result Date: 06/07/2023 CLINICAL DATA:  Chest pain, cough. EXAM: CHEST - 2 VIEW COMPARISON:  Chest radiograph dated 10/18/2019. FINDINGS: The heart is borderline enlarged and has suggestion of a water bottle configuration which may reflect a pericardial effusion. Vascular calcifications are seen in the aortic arch. There is mild bibasilar atelectasis/airspace disease. A small left pleural effusion may contribute. No pneumothorax on either side. Degenerative changes are seen in the spine. IMPRESSION: 1. Borderline cardiomegaly with a configuration suggestive of a pericardial effusion. 2. Mild bibasilar atelectasis/airspace disease. A small left pleural effusion may contribute. Electronically Signed   By: Romona Curls M.D.   On: 06/07/2023 15:56    Procedures Procedures    Medications Ordered in ED Medications  cefTRIAXone (ROCEPHIN) 1 g in sodium chloride 0.9 % 100 mL IVPB (has no administration in time range)  azithromycin (ZITHROMAX) 500 mg in sodium chloride 0.9 % 250 mL IVPB (has no administration in time range)  sodium  chloride 0.9 % bolus 1,000 mL (0 mLs Intravenous Stopped 06/07/23 1819)  ondansetron (ZOFRAN) injection 4 mg (4 mg Intravenous Given 06/07/23 1619)  ipratropium-albuterol (DUONEB) 0.5-2.5 (3) MG/3ML nebulizer solution 3 mL (3 mLs Nebulization Given 06/07/23 1620)  iohexol (OMNIPAQUE) 350 MG/ML injection 75 mL (75 mLs Intravenous Contrast Given 06/07/23 1731)    ED Course/ Medical Decision Making/ A&P                                 Medical Decision Making 73 year old female with past medical history of diabetes, CVA, hypertension, and esophagitis presenting to the emergency department today with nausea, vomiting, and abdominal pain.  Patient also having some cough and shortness of breath.  This been going now for the past few weeks.  I will further evaluate her here with basic labs as well as an EKG, chest x-ray, and troponin for further evaluation for ACS, pulmonary edema, pulmonary infiltrates, pneumothorax.  Will give patient Zofran here as well as some IV fluids as she does appear dry here on exam.  Her abdomen is diffusely tender.  Will further evaluate her here with LFTs and lipase to evaluate for hepatobiliary pathology and pancreatitis.  Will also obtain a CT scan of her abdomen given her age to evaluate for appendicitis, diverticulitis, colitis, obstruction, or other intra-abdominal pathology.  I will give the patient a DuoNeb here as she does have a bronchospastic cough and some mild wheezing.  May be due to bronchitis but she has clinically worsened after antibiotics and steroids as an outpatient.  I will reevaluate for ultimate disposition.  The patient's labs are reassuring overall.  She did have mildly elevated troponins which I think are likely secondary to pneumonia seen on her CT scan of her abdomen.  There were some other incidental findings but the main finding was for pneumonia.  The patient has failed outpatient antibiotic treatment with Augmentin.  A call was placed to family medicine  service for  admission.  There was concern for possible pericardial effusion on x-ray but looking at the patient's CT scan this does not appear to be a large effusion on my read.  Amount and/or Complexity of Data Reviewed Labs: ordered. Radiology: ordered.  Risk Prescription drug management. Decision regarding hospitalization.           Final Clinical Impression(s) / ED Diagnoses Final diagnoses:  Community acquired pneumonia, unspecified laterality    Rx / DC Orders ED Discharge Orders     None         Durwin Glaze, MD 06/07/23 [id number]

## 2023-06-08 DIAGNOSIS — J189 Pneumonia, unspecified organism: Principal | ICD-10-CM

## 2023-06-08 DIAGNOSIS — K219 Gastro-esophageal reflux disease without esophagitis: Secondary | ICD-10-CM | POA: Diagnosis not present

## 2023-06-08 DIAGNOSIS — E876 Hypokalemia: Secondary | ICD-10-CM | POA: Diagnosis present

## 2023-06-08 DIAGNOSIS — K209 Esophagitis, unspecified without bleeding: Secondary | ICD-10-CM

## 2023-06-08 LAB — BASIC METABOLIC PANEL
Anion gap: 10 (ref 5–15)
BUN: 8 mg/dL (ref 8–23)
CO2: 24 mmol/L (ref 22–32)
Calcium: 7.8 mg/dL — ABNORMAL LOW (ref 8.9–10.3)
Chloride: 104 mmol/L (ref 98–111)
Creatinine, Ser: 0.76 mg/dL (ref 0.44–1.00)
GFR, Estimated: >60 mL/min (ref >60)
Glucose, Bld: 214 mg/dL — ABNORMAL HIGH (ref 70–99)
Potassium: 3 mmol/L — ABNORMAL LOW (ref 3.5–5.1)
Sodium: 138 mmol/L (ref 135–145)

## 2023-06-08 LAB — GLUCOSE, CAPILLARY
Glucose-Capillary: 151 mg/dL — ABNORMAL HIGH (ref 70–99)
Glucose-Capillary: 211 mg/dL — ABNORMAL HIGH (ref 70–99)
Glucose-Capillary: 279 mg/dL — ABNORMAL HIGH (ref 70–99)
Glucose-Capillary: 427 mg/dL — ABNORMAL HIGH (ref 70–99)
Glucose-Capillary: 448 mg/dL — ABNORMAL HIGH (ref 70–99)
Glucose-Capillary: 427 mg/dL — ABNORMAL HIGH (ref 70–99)

## 2023-06-08 LAB — CBC
HCT: 34.7 % — ABNORMAL LOW (ref 36.0–46.0)
Hemoglobin: 11.3 g/dL — ABNORMAL LOW (ref 12.0–15.0)
MCH: 27.9 pg (ref 26.0–34.0)
MCHC: 32.6 g/dL (ref 30.0–36.0)
MCV: 85.7 fL (ref 80.0–100.0)
Platelets: 261 10*3/uL (ref 150–400)
RBC: 4.05 MIL/uL (ref 3.87–5.11)
RDW: 13.4 % (ref 11.5–15.5)
WBC: 8.4 10*3/uL (ref 4.0–10.5)
nRBC: 0.2 % (ref 0.0–0.2)

## 2023-06-08 LAB — TROPONIN I (HIGH SENSITIVITY): Troponin I (High Sensitivity): 65 ng/L — ABNORMAL HIGH (ref <18)

## 2023-06-08 MED ORDER — AZITHROMYCIN 250 MG PO TABS
500.0000 mg | ORAL_TABLET | Freq: Every day | ORAL | Status: AC
  Administered 2023-06-09: 500 mg via ORAL
  Filled 2023-06-08: qty 2

## 2023-06-08 MED ORDER — GUAIFENESIN-DM 100-10 MG/5ML PO SYRP
5.0000 mL | ORAL_SOLUTION | ORAL | Status: DC | PRN
  Administered 2023-06-09: 5 mL via ORAL
  Filled 2023-06-08: qty 5

## 2023-06-08 MED ORDER — GLYCERIN (LAXATIVE) 2 G RE SUPP
1.0000 | Freq: Once | RECTAL | Status: DC
  Filled 2023-06-08: qty 1

## 2023-06-08 MED ORDER — PANTOPRAZOLE SODIUM 40 MG PO TBEC
40.0000 mg | DELAYED_RELEASE_TABLET | Freq: Two times a day (BID) | ORAL | Status: DC
  Administered 2023-06-08 – 2023-06-09 (×3): 40 mg via ORAL
  Filled 2023-06-08 (×3): qty 1

## 2023-06-08 MED ORDER — POTASSIUM CHLORIDE CRYS ER 20 MEQ PO TBCR
60.0000 meq | EXTENDED_RELEASE_TABLET | Freq: Once | ORAL | Status: AC
  Administered 2023-06-08: 60 meq via ORAL
  Filled 2023-06-08: qty 3

## 2023-06-08 MED ORDER — SENNOSIDES-DOCUSATE SODIUM 8.6-50 MG PO TABS
1.0000 | ORAL_TABLET | Freq: Two times a day (BID) | ORAL | Status: DC
  Administered 2023-06-08 – 2023-06-09 (×3): 1 via ORAL
  Filled 2023-06-08 (×3): qty 1

## 2023-06-08 MED ORDER — POLYETHYLENE GLYCOL 3350 17 G PO PACK
17.0000 g | PACK | Freq: Every day | ORAL | Status: DC
Start: 2023-06-08 — End: 2023-06-09
  Administered 2023-06-08 – 2023-06-09 (×2): 17 g via ORAL
  Filled 2023-06-08 (×2): qty 1

## 2023-06-08 MED ORDER — CEFUROXIME AXETIL 250 MG PO TABS
500.0000 mg | ORAL_TABLET | Freq: Two times a day (BID) | ORAL | Status: DC
  Administered 2023-06-08 – 2023-06-09 (×3): 500 mg via ORAL
  Filled 2023-06-08 (×4): qty 2

## 2023-06-08 MED ORDER — INSULIN GLARGINE-YFGN 100 UNIT/ML ~~LOC~~ SOLN
10.0000 [IU] | Freq: Every day | SUBCUTANEOUS | Status: DC
  Administered 2023-06-08: 10 [IU] via SUBCUTANEOUS
  Filled 2023-06-08 (×2): qty 0.1

## 2023-06-08 MED ORDER — INSULIN ASPART 100 UNIT/ML IJ SOLN
0.0000 [IU] | Freq: Every day | INTRAMUSCULAR | Status: DC
  Administered 2023-06-08: 5 [IU] via SUBCUTANEOUS

## 2023-06-08 MED ORDER — INSULIN ASPART 100 UNIT/ML IJ SOLN
0.0000 [IU] | Freq: Three times a day (TID) | INTRAMUSCULAR | Status: DC
  Administered 2023-06-08: 20 [IU] via SUBCUTANEOUS
  Administered 2023-06-09: 15 [IU] via SUBCUTANEOUS

## 2023-06-08 NOTE — Assessment & Plan Note (Addendum)
K+ to 3.0 to BMP this AM; likely due to GI losses. S/p PO K+ .  - AM BMP/Mg

## 2023-06-08 NOTE — Progress Notes (Signed)
Mobility Specialist Progress Note:   06/08/23 1500  Mobility  Activity Ambulated with assistance in hallway  Level of Assistance Contact guard assist, steadying assist  Assistive Device Front wheel walker  Distance Ambulated (ft) 130 ft  Activity Response Tolerated well  Mobility Referral Yes  Mobility visit 1 Mobility  Mobility Specialist Start Time (ACUTE ONLY) 1340  Mobility Specialist Stop Time (ACUTE ONLY) 1405  Mobility Specialist Time Calculation (min) (ACUTE ONLY) 25 min   Pt received in bed, agreeable to mobility. Upon standing pt found with BM in bed. Pt transferred to [facility]. Assisted with pericare. Pt was able to ambulate in hallway with CG assist. Verbal cues required to remind pt to walk within RW. Seated rest break required d/t L knee pain, otherwise asx throughout. VSS. Pt returned to bed with call bell in reach and all needs met.   Leory Plowman  Mobility Specialist Please contact via [employer] office at [phone]

## 2023-06-08 NOTE — Assessment & Plan Note (Addendum)
Patient with ongoing complaints of abdominal pain, nausea, vomiting and limited PO intake which seems to have a chronic element overall. H/o diabetic gastroparesis and constipation. Abdominal workup negative for acute concern. Suspect that this is exacerbation of chronic pain versus element of constipation versus pain related to gastritis/PUD.  - Consulted [facility]; they did not suspect that findings were occlusive and no evidence of intestinal ischemia. They will follow up with her outpatient to optimize medical management.  - Tylenol 1000 mg q8h as needed  - Heat compress  - d/c Toradol  - Continue home reglan, metoclopramide  - Add Miralax/Senna, has not had a bowel movement in four days  - Re-evaluate abdominal exam this afternoon  - Consider H. pylori testing outpatient

## 2023-06-08 NOTE — TOC Initial Note (Signed)
Transition of Care [facility]) - Initial/Assessment Note    Patient Details  Name: [name] [name] MRN: [id number] Date of Birth: 07-Feb-1951  Transition of Care [facility]) CM/SW Contact:    [name] Lewandowsky, RN Phone Number: 06/08/2023, 1:55 PM  Clinical Narrative: Patient presented for shortness of breath and cough. PTA patient was from home with granddaughter [name] [name]. Patient states she has a rollator in the home that she uses. Patient states she has transportation to appointments via [employer] or cab. Patient will benefit from PT/OT consult-discussed with staff RN. Case Manager will continue to follow for additional transition of care needs as the patient progresses.                   Expected Discharge Plan: Home w Home Health Services Barriers to Discharge: Continued Medical Work up   Patient Goals and CMS Choice Patient states their goals for this hospitalization and ongoing recovery are:: plan to return home once stable   Choice offered to / list presented to : NA   Expected Discharge Plan and Services   Discharge Planning Services: CM Consult  DME Agency: NA   Prior Living Arrangements/Services   Lives with:: Relatives (granddaughter- [name] [name].) Patient language and need for interpreter reviewed:: Yes Do you feel safe going back to the place where you live?: Yes      Need for Family Participation in Patient Care: Yes (Comment) Care giver support system in place?: Yes (comment) Current home services: DME (rollator) Criminal Activity/Legal Involvement Pertinent to Current Situation/Hospitalization: No - Comment as needed  Permission Sought/Granted Permission sought to share information with : Case Manager, Family Supports    Emotional Assessment Appearance:: Appears stated age Attitude/Demeanor/Rapport: Engaged Affect (typically observed): Appropriate Orientation: : Oriented to Self, Oriented to Place, Oriented to  Time Alcohol / Substance Use: Not Applicable Psych  Involvement: No (comment)  Admission diagnosis:  CAP (community acquired pneumonia) [J18.9] Community acquired pneumonia, unspecified laterality [J18.9] Patient Active Problem List   Diagnosis Date Noted   Hypokalemia 06/08/2023   CAP (community acquired pneumonia) 06/07/2023   Renal mass 06/07/2023   COPD exacerbation (HCC) 06/01/2022   Poor dentition 06/01/2022   Urticaria 10/24/2021   Myofascial pain dysfunction syndrome 07/29/2019   Keloid 05/09/2019   NAFLD (nonalcoholic fatty liver disease) 69/62/9528   Chronic sinusitis 07/14/2017   Hypertension associated with diabetes (HCC)    Spinal stenosis of lumbosacral region with radiculopathy 07/16/2015   Chronic bronchitis with COPD (chronic obstructive pulmonary disease) (HCC)    Coronary artery disease involving native coronary artery of native heart with angina pectoris (HCC)    Dyslipidemia associated with type 2 diabetes mellitus (HCC) 10/26/2014   Tobacco abuse 10/26/2014   Anxiety state 09/27/2014   Candidal intertrigo 05/11/2013   Fibromyalgia 02/14/2013   Diabetic peripheral neuropathy (HCC) 02/14/2013   DM type 2 with diabetic peripheral neuropathy (HCC) 03/01/2011   Abdominal pain 12/19/2010   Diabetic gastroparesis associated with type 2 diabetes mellitus (HCC) 11/07/2010   Pancreatitis, recurrent 11/07/2010   Chronic cough 09/25/2010   Depression 08/14/2009   Chronic pain syndrome 08/14/2009   GERD (gastroesophageal reflux disease) 02/11/2008   URINARY INCONTINENCE 10/21/2006   PCP:  [name], MD Pharmacy:   [facility] [phone] [place], [place] - [address] AT [facility] [address] [place] [address] Phone: [phone] Fax: [phone]  [facility] - [place], [place] - [address] [place]  [place] [address] Phone: [phone] Fax: [phone]     Social Drivers of Health (SDOH) Social History: SDOH  Screenings   Food Insecurity: No Food Insecurity (06/07/2023)  Housing: Low Risk  (06/07/2023)  Transportation Needs: No Transportation Needs (06/07/2023)  Utilities: Not At Risk (06/07/2023)  Alcohol Screen: Low Risk  (10/14/2022)  Depression (PHQ2-9): High Risk (06/02/2023)  Financial Resource Strain: Medium Risk (10/14/2022)  Physical Activity: Inactive (08/15/2022)  Social Connections: Moderately Isolated (08/15/2022)  Stress: Stress Concern Present (08/15/2022)  Tobacco Use: High Risk (06/07/2023)  Health Literacy: Adequate Health Literacy (03/06/2023)   SDOH Interventions: Food Insecurity Interventions: Intervention Not Indicated Housing Interventions: Intervention Not Indicated   Readmission Risk Interventions     No data to display

## 2023-06-08 NOTE — Plan of Care (Signed)
   Problem: Coping: Goal: Ability to adjust to condition or change in health will improve Outcome: Progressing   Problem: Fluid Volume: Goal: Ability to maintain a balanced intake and output will improve Outcome: Progressing   Problem: Health Behavior/Discharge Planning: Goal: Ability to manage health-related needs will improve Outcome: Progressing   Problem: Nutritional: Goal: Maintenance of adequate nutrition will improve Outcome: Progressing   Problem: Skin Integrity: Goal: Risk for impaired skin integrity will decrease Outcome: Progressing   Problem: Tissue Perfusion: Goal: Adequacy of tissue perfusion will improve Outcome: Progressing

## 2023-06-08 NOTE — Assessment & Plan Note (Addendum)
CT also with lower lobe opacity suggestive of superimposed pneumonia. Recently failed outpatient steroid and abx, will broaden abx due to concern of untreated pnumonia.  - Transition to PO Azithromycin (end 02/18) & Cefuroxime (end 02/20)  - D/c prednisone  - Duo nebs q6h prn  - Maintain O2 sats > 88%  - D/c maintenance fluids as PO is improving  - follow Bcx, NGTD - AM CBC and BMP

## 2023-06-08 NOTE — Plan of Care (Signed)
  Problem: Education: Goal: Knowledge of General Education information will improve Description: Including pain rating scale, medication(s)/side effects and non-pharmacologic comfort measures Outcome: Progressing   Problem: Health Behavior/Discharge Planning: Goal: Ability to manage health-related needs will improve Outcome: Progressing   Problem: Clinical Measurements: Goal: Ability to maintain clinical measurements within normal limits will improve Outcome: Progressing Goal: Will remain free from infection Outcome: Progressing Goal: Diagnostic test results will improve Outcome: Progressing Goal: Respiratory complications will improve Outcome: Progressing Goal: Cardiovascular complication will be avoided Outcome: Progressing   Problem: Activity: Goal: Risk for activity intolerance will decrease Outcome: Progressing   Problem: Nutrition: Goal: Adequate nutrition will be maintained Outcome: Progressing   Problem: Coping: Goal: Level of anxiety will decrease Outcome: Progressing   Problem: Elimination: Goal: Will not experience complications related to bowel motility Outcome: Progressing Goal: Will not experience complications related to urinary retention Outcome: Progressing   Problem: Pain Managment: Goal: General experience of comfort will improve and/or be controlled Outcome: Progressing   Problem: Safety: Goal: Ability to remain free from injury will improve Outcome: Progressing   Problem: Skin Integrity: Goal: Risk for impaired skin integrity will decrease Outcome: Progressing   Problem: Education: Goal: Knowledge of disease or condition will improve Outcome: Progressing Goal: Knowledge of the prescribed therapeutic regimen will improve Outcome: Progressing Goal: Individualized Educational Video(s) Outcome: Progressing   Problem: Activity: Goal: Ability to tolerate increased activity will improve Outcome: Progressing Goal: Will verbalize the  importance of balancing activity with adequate rest periods Outcome: Progressing   Problem: Respiratory: Goal: Ability to maintain a clear airway will improve Outcome: Progressing Goal: Levels of oxygenation will improve Outcome: Progressing Goal: Ability to maintain adequate ventilation will improve Outcome: Progressing   Problem: Activity: Goal: Ability to tolerate increased activity will improve Outcome: Progressing   Problem: Clinical Measurements: Goal: Ability to maintain a body temperature in the normal range will improve Outcome: Progressing   Problem: Respiratory: Goal: Ability to maintain adequate ventilation will improve Outcome: Progressing Goal: Ability to maintain a clear airway will improve Outcome: Progressing   Problem: Education: Goal: Ability to describe self-care measures that may prevent or decrease complications (Diabetes Survival Skills Education) will improve Outcome: Progressing Goal: Individualized Educational Video(s) Outcome: Progressing   Problem: Coping: Goal: Ability to adjust to condition or change in health will improve Outcome: Progressing   Problem: Fluid Volume: Goal: Ability to maintain a balanced intake and output will improve Outcome: Progressing   Problem: Health Behavior/Discharge Planning: Goal: Ability to identify and utilize available resources and services will improve Outcome: Progressing Goal: Ability to manage health-related needs will improve Outcome: Progressing   Problem: Metabolic: Goal: Ability to maintain appropriate glucose levels will improve Outcome: Progressing   Problem: Nutritional: Goal: Maintenance of adequate nutrition will improve Outcome: Progressing Goal: Progress toward achieving an optimal weight will improve Outcome: Progressing   Problem: Skin Integrity: Goal: Risk for impaired skin integrity will decrease Outcome: Progressing   Problem: Tissue Perfusion: Goal: Adequacy of tissue  perfusion will improve Outcome: Progressing

## 2023-06-08 NOTE — Assessment & Plan Note (Addendum)
As appreciated on CT abdomen and pelvis. Unlikely contributing to current presentation.  - Outpatient follow-up with MRI with and without contrast for definitive characterization

## 2023-06-08 NOTE — Progress Notes (Addendum)
Daily Progress Note Intern Pager: [phone]  Patient name: [name] [name] Medical record number: [id number] Date of birth: 02/24/1951 Age: 73 y.o. Gender: female  Primary Care Provider: [name], MD Consultants: None Code Status: Full   Pt Overview and Major Events to Date:  02/16: admitted, started IV abx   Assessment and Plan:  [name] [name] is a 73 y.o. female with PMHx including COPD, HFpEF, DM, GERD, NAFLD, recurrent pancreatitis, CAD, HTN, HLD, and chronic pain, who presented with SOB/cough and found to have findings consistent with CAP on imaging.  Assessment & Plan CAP (community acquired pneumonia) CT also with lower lobe opacity suggestive of superimposed pneumonia. Recently failed outpatient steroid and abx, will broaden abx due to concern of untreated pnumonia.  - Transition to PO Azithromycin (end 02/18) & Cefuroxime (end 02/20)  - D/c prednisone  - Duo nebs q6h prn  - Maintain O2 sats > 88%  - D/c maintenance fluids as PO is improving  - follow Bcx, NGTD - AM CBC and BMP  Abdominal pain Patient with ongoing complaints of abdominal pain, nausea, vomiting and limited PO intake which seems to have a chronic element overall. H/o diabetic gastroparesis and constipation. Abdominal workup negative for acute concern. Suspect that this is exacerbation of chronic pain versus element of constipation versus pain related to gastritis/PUD.  - Consulted [facility]; they did not suspect that findings were occlusive and no evidence of intestinal ischemia. They will follow up with her outpatient to optimize medical management.  - Tylenol 1000 mg q8h as needed  - Heat compress  - d/c Toradol  - Continue home reglan, metoclopramide  - Add Miralax/Senna, has not had a bowel movement in four days  - Re-evaluate abdominal exam this afternoon  - Consider H. pylori testing outpatient Renal mass As appreciated on CT abdomen and pelvis. Unlikely contributing to current presentation.  -  Outpatient follow-up with MRI with and without contrast for definitive characterization Hypokalemia K+ to 3.0 to BMP this AM; likely due to GI losses. S/p PO K+ .  - AM BMP/Mg  Chronic and Stable Issues: DM: Continue Heart/Carb modified diet. Restarting Lantus 10u + SSI  HLD: Continue Home Rosuvastatin 40 mg daily  HTN: Continue Home meds: carvedilol 25 mg BID, Losartan 25 mg daily, Amlodipine 10 mg daily  HFpEF: Continue Home meds COPD: Scheduled duo-nebs q6 hours Holding home meds.  GERD: Restart Protonix 40 mg BID, as above  Anxiety: Continue home hydroxyzine 25 mg TID prn  Depression: Continue home meds: Seroquel 200 at bedtime, Mirtazapine (pt not taking)   Stroke: ASA 81 mg daily  Chronic Pain: Gabapentin 600 mg TID, Tylenol 1000 mg q8h as needed  FEN/GI: heart healthy, carb modified  PPx: lovenox  Dispo:Home  today or tomorrow . Barriers include abdominal and respiratory re-evaluation this afternoon.   Subjective:  NAEON. She states that her breathing feels overall improved this morning but she continues to have productive cough. Abdominal pain continues but is improved overall as well since admission. She denies bloody stools, bloody vomiting.    Objective: Temp:  [98.1 F (36.7 C)-99.2 F (37.3 C)] 98.2 F (36.8 C) (02/17 0834) Pulse Rate:  [66-91] 68 (02/17 0834) Resp:  [11-22] 17 (02/17 0834) BP: (126-182)/(47-101) 133/47 (02/17 0834) SpO2:  [96 %-100 %] 97 % (02/17 0834) Weight:  [85.5 kg] 85.5 kg (02/16 1418) Physical Exam: General: breathing comfortably while on breathing treatment. NAD.  Cardiovascular: RRR. No murmurs, rubs, or gallops.  Respiratory: No increased  work of breathing. Decreased breath sounds throughout the left lung base and coarse breath sounds throughout the right.  Abdomen: Soft. Mild tenderness most pronounced in the upper abdominal regions. No guarding, rebound.  Extremities: Warm, well perfused.   Laboratory: Most recent CBC Lab  Results  Component Value Date   WBC 8.4 06/08/2023   HGB 11.3 (L) 06/08/2023   HCT 34.7 (L) 06/08/2023   MCV 85.7 06/08/2023   PLT 261 06/08/2023   Most recent BMP    Latest Ref Rng & Units 06/08/2023   12:48 AM  BMP  Glucose 70 - 99 mg/dL 528   BUN 8 - 23 mg/dL 8   Creatinine 4.13 - 2.44 mg/dL 0.10   Sodium 272 - 536 mmol/L 138   Potassium 3.5 - 5.1 mmol/L 3.0   Chloride 98 - 111 mmol/L 104   CO2 22 - 32 mmol/L 24   Calcium 8.9 - 10.3 mg/dL 7.8    Imaging/Diagnostic Tests: CXR:  1. Borderline cardiomegaly with a configuration suggestive of a pericardial effusion. 2. Mild bibasilar atelectasis/airspace disease. A small left pleural effusion may contribute.   CT AP w/ contrast:  1. Circumferential wall thickening in the stomach antrum, cannot exclude antritis/peptic ulcer disease. 2. Patchy and bandlike airspace opacities in the right middle lobe, lingula, and especially both lower lobes with some airway plugging in the lower lobes. Some of the appearance is due to atelectasis but the lower lobe airspace opacities raise suspicion for superimposed pneumonia. 3. Mild cardiomegaly. Trace pericardial effusion. 4. Substantial soft plaque or mural thrombus narrowing of the superior mesenteric artery. No overt occlusion. 5. 1.5 by 0.9 cm hypodense but complex lesion of the left kidney lower pole measuring about 66 Hounsfield units on delayed. This indeterminate lesion could be a complex cyst or mass. Renal protocol MRI with and without contrast is recommended in the non-acute setting for definitive characterization. 6. Small supraumbilical hernia contains adipose tissue and a small rim calcified fatty structure compatible with a chronic focus of fat necrosis. Similar appearance 09/23/2021. 7. Mid and lower lumbar degenerative facet arthropathy with multilevel impingement. 8. Chronic deformity of the left pubis related to old fracture. 9. Aortic atherosclerosis.  [name] [name], Medical Student 06/08/2023, 12:25 PM [facility] FPTS Intern pager: [phone], text pages welcome Secure chat group [facility] Teaching Service   Upper Level Addendum:   I have seen and evaluated this patient along with Student Doctor Theo Dills and reviewed the above note, making necessary revisions as appropriate.  I agree with the medical decision making and physical exam as noted above.   Elberta Fortis, DO PGY-2, [facility] Family Medicine Residency    I have personally seen and examined this patient and reviewed their chart. I have discussed this patient with the resident. I agree with the assessment and plan as outlined.   Ellwood Dense, DO Family Medicine Teaching Service

## 2023-06-08 NOTE — Care Management Obs Status (Signed)
MEDICARE OBSERVATION STATUS NOTIFICATION   Patient Details  Name: [name] [name] MRN: [id number] Date of Birth: [birth year]   Medicare Observation Status Notification Given:  Yes    Carley Hammed, LCSW 06/08/2023, 3:58 PM

## 2023-06-08 NOTE — Plan of Care (Addendum)
FMTS Interim Progress Note  Patient assessed at bedside and she was on the phone with her granddaughter.  Reports continued sore throat from coughing.  Denies difficulty breathing.  States she did have bowel movement earlier with a glycerin suppository and her abdominal pain has improved.  O: BP 130/89   Pulse 78   Temp 98.6 F (37 C) (Oral)   Resp 18   Ht 5\' 3"  (1.6 m)   Wt 85.5 kg   SpO2 97%   BMI 33.39 kg/m    General: Sitting up at the edge of the bed.  NAD. Resp: Intermittent cough.  Normal work of breathing on room air. Abdomen: Soft, nontender, nondistended.  A/P: CAP Stable from respiratory standpoint, will continue supportive care. -Add Robitussin every 4 hours as needed  Abdominal pain Mostly resolved bowel movement although does have some burning in her throat.  Higher suspicion for underlying gastritis. -Symptomatic management -Consider H. pylori testing outpatient  T2DM BGL in the 400s, per patient she has been drinking a lot of cranberry juice. -Switch to rSSI plus nightly coverage  Elberta Fortis, MD 06/08/2023, 6:07 PM PGY-2, [facility] Family Medicine Service pager [phone]

## 2023-06-08 NOTE — Inpatient Diabetes Management (Signed)
Inpatient Diabetes Program Recommendations  AACE/ADA: New Consensus Statement on Inpatient Glycemic Control (2015)  Target Ranges:  Prepandial:   less than 140 mg/dL      Peak postprandial:   less than 180 mg/dL (1-2 hours)      Critically ill patients:  140 - 180 mg/dL   Lab Results  Component Value Date   GLUCAP 279 (H) 06/08/2023   HGBA1C 8.3 (H) 06/07/2023    Review of Glycemic Control  Latest Reference Range & Units 06/08/23 07:56 06/08/23 11:34  Glucose-Capillary 70 - 99 mg/dL 416 (H) 606 (H)   Diabetes history: DM2 Outpatient Diabetes medications:  FSL3 Lantus 18 units daily Ozempic 0.5 mg weekly Current orders for Inpatient glycemic control:  Novolog 0-15 units tid with meals  Semglee 10 units q HS Inpatient Diabetes Program Recommendations:    Agree with addition of Semglee.  Will need adjustments based on fasting CBG's.    Thanks,  Lorenza Cambridge, RN, BC-ADM Inpatient Diabetes Coordinator Pager [phone]  (8a-5p)

## 2023-06-09 ENCOUNTER — Other Ambulatory Visit: Payer: Self-pay

## 2023-06-09 ENCOUNTER — Other Ambulatory Visit (HOSPITAL_COMMUNITY): Payer: Self-pay

## 2023-06-09 LAB — CBC
HCT: 34.1 % — ABNORMAL LOW (ref 36.0–46.0)
Hemoglobin: 11 g/dL — ABNORMAL LOW (ref 12.0–15.0)
MCH: 27.9 pg (ref 26.0–34.0)
MCHC: 32.3 g/dL (ref 30.0–36.0)
MCV: 86.5 fL (ref 80.0–100.0)
Platelets: 257 10*3/uL (ref 150–400)
RBC: 3.94 MIL/uL (ref 3.87–5.11)
RDW: 13.3 % (ref 11.5–15.5)
WBC: 13.4 10*3/uL — ABNORMAL HIGH (ref 4.0–10.5)
nRBC: 0 % (ref 0.0–0.2)

## 2023-06-09 LAB — BASIC METABOLIC PANEL
Anion gap: 12 (ref 5–15)
BUN: 14 mg/dL (ref 8–23)
CO2: 23 mmol/L (ref 22–32)
Calcium: 8.6 mg/dL — ABNORMAL LOW (ref 8.9–10.3)
Chloride: 104 mmol/L (ref 98–111)
Creatinine, Ser: 0.92 mg/dL (ref 0.44–1.00)
GFR, Estimated: >60 mL/min (ref >60)
Glucose, Bld: 296 mg/dL — ABNORMAL HIGH (ref 70–99)
Potassium: 3.8 mmol/L (ref 3.5–5.1)
Sodium: 139 mmol/L (ref 135–145)

## 2023-06-09 LAB — MAGNESIUM: Magnesium: 1.9 mg/dL (ref 1.7–2.4)

## 2023-06-09 LAB — GLUCOSE, CAPILLARY
Glucose-Capillary: 192 mg/dL — ABNORMAL HIGH (ref 70–99)
Glucose-Capillary: 301 mg/dL — ABNORMAL HIGH (ref 70–99)
Glucose-Capillary: 197 mg/dL — ABNORMAL HIGH (ref 70–99)

## 2023-06-09 MED ORDER — BRIMONIDINE TARTRATE 0.15 % OP SOLN: 2.0000 [drp] | Freq: Two times a day (BID) | OPHTHALMIC | 3 refills | Status: DC

## 2023-06-09 MED ORDER — DORZOLAMIDE HCL 2 % OP SOLN: 2.0000 [drp] | Freq: Two times a day (BID) | OPHTHALMIC | 3 refills | Status: AC

## 2023-06-09 MED ORDER — BENZONATATE 100 MG PO CAPS
100.0000 mg | ORAL_CAPSULE | Freq: Three times a day (TID) | ORAL | 0 refills | Status: DC | PRN
  Filled 2023-06-09: qty 60, 20d supply, fill #0

## 2023-06-09 MED ORDER — PANTOPRAZOLE SODIUM 40 MG PO TBEC
40.0000 mg | DELAYED_RELEASE_TABLET | Freq: Every day | ORAL | 0 refills | Status: DC
Start: 2023-06-09 — End: 2023-10-30
  Filled 2023-06-09: qty 30, 30d supply, fill #0

## 2023-06-09 MED ORDER — CEFUROXIME AXETIL 500 MG PO TABS
500.0000 mg | ORAL_TABLET | Freq: Two times a day (BID) | ORAL | 0 refills | Status: AC
  Filled 2023-06-09: qty 5, 3d supply, fill #0

## 2023-06-09 NOTE — Evaluation (Signed)
Occupational Therapy Evaluation Patient Details Name: [name] [name] MRN: [id number] DOB: November 07, 1950 Today's Date: 06/09/2023   History of Present Illness   73 y/o female presents to [facility] on 2/17 with reports of SOB and cough. Patient with ongoing respiratory symptoms suggest pneumonia. PMH includes HFpEF, HLD, HTN, COPD, DM ,GERD ,Stroke, Chronic pain, Fibromyalgia, Depression, and Anxiety.     Clinical Impressions PTA, pt reports being mod I in ADL and grand daughter who lives with her provides assist with medication management, transportation, and intermittently with cooking and cleaning. Pt receives medical transport for appts. Upon eva, pt with decreased activity tolerance but suspect close to baseline. Pt also with decreased memory at baseline. Due to pt near baseline and performing ADL with mod I- Supervision, OT recommends mobility specialist consult and no further acute needs identified with grand daughter able to assist as needed at home. . OT to sign off.      If plan is discharge home, recommend the following:   Assist for transportation;Help with stairs or ramp for entrance;Direct supervision/assist for financial management;Direct supervision/assist for medications management;Assistance with cooking/housework     Functional Status Assessment   Patient has had a recent decline in their functional status and demonstrates the ability to make significant improvements in function in a reasonable and predictable amount of time.     Equipment Recommendations   None recommended by OT     Recommendations for Other Services         Precautions/Restrictions   Precautions Precautions: Fall Restrictions Weight Bearing Restrictions Per Provider Order: No     Mobility Bed Mobility Overal bed mobility: Independent             General bed mobility comments: Able to transition EOB without assistance    Transfers Overall transfer level: Independent Equipment  used: Rollator (4 wheels)               General transfer comment: Cues to ensure safety, such as locking brakes on rollator prior to standing and ambulating      Balance Overall balance assessment: Independent                                         ADL either performed or assessed with clinical judgement   ADL Overall ADL's : Needs assistance/impaired Eating/Feeding: Sitting;Independent   Grooming: Standing;Supervision/safety   Upper Body Bathing: Independent   Lower Body Bathing: Modified independent;Sit to/from stand   Upper Body Dressing : Independent   Lower Body Dressing: Modified independent;Sit to/from stand   Toilet Transfer: Modified Independent;Rollator (4 wheels)           Functional mobility during ADLs: Modified independent;Rollator (4 wheels)       Vision   Vision Assessment?: No apparent visual deficits Additional Comments: WFL for basic tasks assessed     Perception Perception: Not tested       Praxis Praxis: Not tested       Pertinent Vitals/Pain Pain Assessment Pain Assessment: Faces Faces Pain Scale: No hurt     Extremity/Trunk Assessment Upper Extremity Assessment Upper Extremity Assessment: Overall WFL for tasks assessed   Lower Extremity Assessment Lower Extremity Assessment: Defer to PT evaluation   Cervical / Trunk Assessment Cervical / Trunk Assessment: Normal   Communication Communication Communication: No apparent difficulties   Cognition Arousal: Alert Behavior During Therapy: WFL for tasks assessed/performed Cognition: No family/caregiver present to determine  baseline             OT - Cognition Comments: pt with mild memory deficits noted observed to ask OT's name 2x but then when OT gives name, stating "im sorry I already asked that". Follows basic commands.                 Following commands: Intact       Cueing  General Comments   Cueing Techniques: Verbal cues;Gestural  cues      Exercises     Shoulder Instructions      Home Living Family/patient expects to be discharged to:: Private residence Living Arrangements: Children Available Help at Discharge: Family Type of Home: House Home Access: Other (comment) (None)     Home Layout: Two level Alternate Level Stairs-Number of Steps: 10 Alternate Level Stairs-Rails: Can reach both Bathroom Shower/Tub: Producer, television/film/video: Handicapped height Bathroom Accessibility: Yes   Home Equipment: Rollator (4 wheels);Rolling Walker (2 wheels);Grab bars - tub/shower;Shower seat;Cane - quad          Prior Functioning/Environment Prior Level of Function : Independent/Modified Independent;History of Falls (last six months)             Mobility Comments: Primarily uses a rollator to ambulate in her house and community ADLs Comments: Grandaughter mostly cooks and cleans, pt intermittenly cooks and cleans. Pt states that her grand daughter assists her with medication management secondary to her declining memory    OT Problem List: Decreased activity tolerance;Cardiopulmonary status limiting activity   OT Treatment/Interventions:        OT Goals(Current goals can be found in the care plan section)   Acute Rehab OT Goals Patient Stated Goal: get better OT Goal Formulation: With patient Time For Goal Achievement: 06/23/23 Potential to Achieve Goals: Good   OT Frequency:       Co-evaluation              AM-PAC OT "6 Clicks" Daily Activity     Outcome Measure Help from another person eating meals?: None Help from another person taking care of personal grooming?: None Help from another person toileting, which includes using toliet, bedpan, or urinal?: None Help from another person bathing (including washing, rinsing, drying)?: None Help from another person to put on and taking off regular upper body clothing?: None Help from another person to put on and taking off regular lower  body clothing?: None 6 Click Score: 24   End of Session Equipment Utilized During Treatment: Gait belt;Rollator (4 wheels) Nurse Communication: Mobility status  Activity Tolerance: Patient tolerated treatment well Patient left: in bed;with call bell/phone within reach  OT Visit Diagnosis: Other (comment) (decresaed activity tolerance)                Time: 1610-9604 OT Time Calculation (min): 19 min Charges:  OT General Charges $OT Visit: 1 Visit OT Evaluation $OT Eval Low Complexity: 1 Low  Tyler Deis, OTR/L [facility] Acute Rehabilitation Office: [phone]   Myrla Halsted 06/09/2023, 11:27 AM

## 2023-06-09 NOTE — Evaluation (Signed)
Physical Therapy Evaluation Patient Details Name: [name] [name] MRN: [id number] DOB: 1951-01-27 Today's Date: 06/09/2023  History of Present Illness  73 y/o female presents to [facility] on 2/17 with reports of SOB and cough. Patient with ongoing respiratory symptoms suggest pneumonia. PMH includes HFpEF, HLD, HTN, COPD, DM ,GERD ,Stroke, Chronic pain, Fibromyalgia, Depression, and Anxiety.  Clinical Impression  Patient lives with daughter and granddaughter and is mostly independent with most ADLs /IADLs. During today's session, the patient was independent with all transfers, however required some verbal cues to ensure safety prior to transfers and ambulation (locking brakes on rollator). The patient ambulated 559ft within the unit and ascended/descended steps with CGA, requiring only one seated rest break in between due to low back discomfort. Patient ambulated with a rollator and only required CGA to ensure safety when ambulating. Patient educated on energy conservation techniques to ease with ADLs/IADLs. No acute PT needs recommended at this time.       If plan is discharge home, recommend the following: A little help with walking and/or transfers;Assistance with cooking/housework;Assist for transportation;Help with stairs or ramp for entrance   Can travel by private vehicle        Equipment Recommendations None recommended by PT  Recommendations for Other Services       Functional Status Assessment Patient has had a recent decline in their functional status and demonstrates the ability to make significant improvements in function in a reasonable and predictable amount of time.     Precautions / Restrictions Precautions Precautions: Fall Restrictions Weight Bearing Restrictions Per Provider Order: No      Mobility  Bed Mobility Overal bed mobility: Independent             General bed mobility comments: Able to transition EOB without assistance    Transfers Overall transfer  level: Independent Equipment used: Rollator (4 wheels)               General transfer comment: Cues to ensure safety, such as locking brakes on rollator prior to standing and ambulating    Ambulation/Gait Ambulation/Gait assistance: Contact guard assist, Supervision Gait Distance (Feet): 550 Feet Assistive device: Rollator (4 wheels) Gait Pattern/deviations: Step-through pattern       General Gait Details: Requires cueing to improve safety when ambulating with the rollator (maintain a close distance between patient and rollator)  Stairs Stairs: Yes Stairs assistance: Contact guard assist Stair Management: One rail Left, One rail Right, Alternating pattern, Forwards Number of Stairs: 5 General stair comments: Safely ascended/descended stairs  Wheelchair Mobility     Tilt Bed    Modified Rankin (Stroke Patients Only)       Balance Overall balance assessment: Independent                                           Pertinent Vitals/Pain Pain Assessment Pain Assessment: 0-10 Pain Score: 8  Pain Location: Chest Pain Descriptors / Indicators: Tightness Pain Intervention(s): Monitored during session    Home Living Family/patient expects to be discharged to:: Private residence Living Arrangements: Children Available Help at Discharge: Family Type of Home: House Home Access: Other (comment) (None)     Alternate Level Stairs-Number of Steps: 10 Home Layout: Two level Home Equipment: Rollator (4 wheels);Rolling Walker (2 wheels);Grab bars - tub/shower;Shower seat      Prior Function Prior Level of Function : Independent/Modified Independent;History of Falls (last six  months)             Mobility Comments: Primarily uses a rollator to ambulate in her house and community 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, intermittenly cooks and cleans     Extremity/Trunk Assessment   Upper Extremity Assessment Upper Extremity  Assessment: Defer to OT evaluation    Lower Extremity Assessment Lower Extremity Assessment: Overall WFL for tasks assessed    Cervical / Trunk Assessment Cervical / Trunk Assessment: Normal  Communication   Communication Communication: No apparent difficulties    Cognition Arousal: Alert Behavior During Therapy: WFL for tasks assessed/performed   PT - Cognitive impairments: No apparent impairments                         Following commands: Intact       Cueing Cueing Techniques: Verbal cues, Gestural cues     General Comments      Exercises     Assessment/Plan    PT Assessment Patient does not need any further PT services  PT Problem List         PT Treatment Interventions      PT Goals (Current goals can be found in the Care Plan section)  Acute Rehab PT Goals Patient Stated Goal: Return Home PT Goal Formulation: With patient Time For Goal Achievement: 06/23/23 Potential to Achieve Goals: Good    Frequency       Co-evaluation               AM-PAC PT "6 Clicks" Mobility  Outcome Measure Help needed turning from your back to your side while in a flat bed without using bedrails?: None Help needed moving from lying on your back to sitting on the side of a flat bed without using bedrails?: None Help needed moving to and from a bed to a chair (including a wheelchair)?: None Help needed standing up from a chair using your arms (e.g., wheelchair or bedside chair)?: None Help needed to walk in hospital room?: A Little Help needed climbing 3-5 steps with a railing? : A Little 6 Click Score: 22    End of Session Equipment Utilized During Treatment: Gait belt Activity Tolerance: Patient tolerated treatment well Patient left: in chair;with call bell/phone within reach;with family/visitor present   PT Visit Diagnosis: Other abnormalities of gait and mobility (R26.89);Pain;History of falling (Z91.81)    Time: 0817-0902 PT Time Calculation  (min) (ACUTE ONLY): 45 min   Charges:   PT Evaluation $PT Eval Low Complexity: 1 Low PT Treatments $Therapeutic Activity: 23-37 mins PT General Charges $$ ACUTE PT VISIT: 1 Visit         Doreen Beam, SPT   Jayshaun Phillips 06/09/2023, 9:56 AM

## 2023-06-09 NOTE — Discharge Instructions (Addendum)
Dear [name] [name],   Thank you for letting us participate in your care! In this section, you will find a brief hospital admission summary of why you were admitted to the hospital, what happened during your admission, your diagnosis/diagnoses, and recommended follow up.   You were admitted for pneumonia and received antibiotics. Please complete your regimen of oral antibiotics as prescribed.  You are also seen for abdominal pain and had imaging that showed some clots in the vessels in your abdomen.  Please follow-up with the vascular doctor to discuss further.   POST-HOSPITAL & CARE INSTRUCTIONS We recommend following up with your PCP within 1 week from being discharged from the hospital. Please let PCP/Specialists know of any changes in medications that were made which you will be able to see in the medications section of this packet. Please also follow up with the vascular doctor   DOCTOR'S APPOINTMENTS & FOLLOW UP Future Appointments  Date Time Provider Department Center  06/12/2023  4:10 PM Gerrit Heck, DO [facility]  08/03/2023  3:40 PM [facility] ANNUAL WELLNESS VISIT [facility]     Thank you for choosing [facility]! Take care and be well!  Family Medicine Teaching Service Inpatient Team [place]  [facility]  [address] [place], [place] [address] [phone]

## 2023-06-09 NOTE — Discharge Summary (Signed)
Family Medicine Teaching [facility] Discharge Summary  Patient name: [name] [name] Medical record number: [id number] Date of birth: 1950-06-20 Age: 73 y.o. Gender: female Date of Admission: 06/07/2023  Date of Discharge: 06/09/23 Admitting Physician: Peterson Ao, MD  Primary Care Provider: [name], MD Consultants: Vascular   Indication for Hospitalization: Community-acquired pneumonia, failed outpatient therapy   Discharge Diagnoses/Problem List:  Principal Problem for Admission: Community-acquired pneumonia  Other Problems addressed during stay:  Active Problems:   Abdominal pain   CAP (community acquired pneumonia)   Renal mass   Hypokalemia   Brief Hospital Course:  [name] is a 73 y.o. female with a history of type 2 diabetes, hypertension, esophagitis, recurrent pancreatitis who was admitted to the [facility] Medicine Teaching Service at [facility] for failure of outpatient management for CAP. Hospital course is outlined below by problem.   CAP Failed outpatient therapy with prednisone and Augmentin.  Presented with continued cough and SOB.  Diffuse wheezes and bronchospastic cough noted on exam with normal SpO2.  Respiratory panel negative. Chest x-ray with pleural effusion. CTAP with superimposed pneumonia.  She was started on IV azithromycin/ceftriaxone, and bronchodilators. At the time of discharge, she had finished a three day course of Azithromycin and transitioned to PO Cefuroxime (end 02/20). She was initially started on systemic steroids; however, these were discontinued because she had improved considerably prior to discharge.   Abdominal pain  Presented with diffuse abdominal pain accompanied by multiple episodes of nausea and vomiting. At times posttussive in nature. Abdomen diffusely tender with voluntary guarding to deep palpation on exam. Lipase and LFTs normal. CTAP with SMA mural thrombus versus soft plaque without total occlusion and thickening of  the antral region consistent with possible gastritis/PUD. Vascular was consulted and did not feel SMA narrowing was the cause of her pain and there was no evidence of intestinal ischemia warranting acute intervention. They opted to follow up with her outpatient to optimize medical management going forward. Bowel regimen was added and pain was improved after BM. At the time of discharge, abdominal pain was minimal and managed with PO Tylenol which she was counseled to use at home for recurrence of pain. Additionally, her home Protonix was lowered to 40mg  daily.    Renal Mass:  1.5 cm x 0.9 cm complex lesion in the lower pole of the left kidney. Incidental finding on her CTAP performed due to her abdominal pain. Radiology recommended follow up MRI w/wo contrast outpatient for further characterization.   Other conditions that were chronic and stable: T2DM (SSI initiated in the hospital), hypertension, anxiety/depression  Issues for follow up: Ensure follow up with VVS for superior mesenteric artery soft plaque vs mural thrombus narrowing MRI with and without contrast for left complex kidney lesion Consider H.pylori testing given symptomology and findings on CTAP  Follow up respiratory status, finishes Cefuroxime 02/20 Consider BMP to reevaluate lytes     Disposition: home  Discharge Condition: stable   Discharge Exam:  Vitals:   06/09/23 0330 06/09/23 0752  BP: (!) 151/53 (!) 176/70  Pulse: 75 76  Resp: 16 18  Temp: 98.6 F (37 C) (!) 97.5 F (36.4 C)  SpO2: 97% (!) 89%   General: Elderly-appearing female in NAD.  Cardiac: RRR. Normal S1/S2. No murmurs, rubs, gallops.  Respiratory: No increased work of breathing. Good air movement bilaterally. Decreased breath sounds throughout the left base with some course sounds in the right. No wheezes.  Abdomen: Soft, non-tender. No distension. No guarding, rebound. Good bowel  sounds throughout.  Skin: No rashes on clothed exam.  Extremities:  Warm, well perfused. No edema.    Significant Procedures: none  Significant Labs and Imaging:  Recent Labs  Lab 06/07/23 1424 06/08/23 0048 06/09/23 0705  WBC 9.3 8.4 13.4*  HGB 12.8 11.3* 11.0*  HCT 39.3 34.7* 34.1*  PLT 307 261 257   Recent Labs  Lab 06/07/23 1424 06/07/23 1626 06/08/23 0048 06/09/23 0705  NA 138  --  138 139  K 3.2*  --  3.0* 3.8  CL 99  --  104 104  CO2 26  --  24 23  GLUCOSE 230*  --  214* 296*  BUN 11  --  8 14  CREATININE 0.87  --  0.76 0.92  CALCIUM 8.4*  --  7.8* 8.6*  MG  --   --   --  1.9  ALKPHOS  --  53  --   --   AST  --  16  --   --   ALT  --  16  --   --   ALBUMIN  --  3.0*  --   --     Pertinent Imaging   CT AP w/ contrast (06/07/23): IMPRESSION: 1. Circumferential wall thickening in the stomach antrum, cannot exclude antritis/peptic ulcer disease. 2. Patchy and bandlike airspace opacities in the right middle lobe, lingula, and especially both lower lobes with some airway plugging in the lower lobes. Some of the appearance is due to atelectasis but the lower lobe airspace opacities raise suspicion for superimposed pneumonia. 3. Mild cardiomegaly. Trace pericardial effusion. 4. Substantial soft plaque or mural thrombus narrowing of the superior mesenteric artery. No overt occlusion. 5. 1.5 by 0.9 cm hypodense but complex lesion of the left kidney lower pole measuring about 66 Hounsfield units on delayed. This indeterminate lesion could be a complex cyst or mass. Renal protocol MRI with and without contrast is recommended in the non-acute setting for definitive characterization. 6. Small supraumbilical hernia contains adipose tissue and a small rim calcified fatty structure compatible with a chronic focus of fat necrosis. Similar appearance 09/23/2021. 7. Mid and lower lumbar degenerative facet arthropathy with multilevel impingement. 8. Chronic deformity of the left pubis related to old fracture. 9. Aortic atherosclerosis.    CXR (06/07/23):  IMPRESSION: 1. Borderline cardiomegaly with a configuration suggestive of a pericardial effusion. 2. Mild bibasilar atelectasis/airspace disease. A small left pleural effusion may contribute.    Results/Tests Pending at Time of Discharge: none  Discharge Medications:  Allergies as of 06/09/2023       Reactions   Desvenlafaxine Other (See Comments)   REACTION: dysphoria/dellusional   Duloxetine Nausea And Vomiting, Other (See Comments)   REACTION: "wheezing" and tremors, dysphoria   Latex Itching   Denies airway, lung involvement   Levofloxacin Other (See Comments)   Shortness of breath with headache   Lithium Rash        Medication List     STOP taking these medications    amoxicillin-clavulanate 875-125 MG tablet Commonly known as: AUGMENTIN   predniSONE 20 MG tablet Commonly known as: DELTASONE       TAKE these medications    Accu-Chek Guide test strip Generic drug: glucose blood Use as instructed to test sugars 3 x daily Dx code: E11.42   Accu-Chek Guide w/Device Kit 1 Device by Does not apply route 4 (four) times daily.   acetaminophen 500 MG tablet Commonly known as: TYLENOL Take 1,000 mg by mouth every 8 (eight)  hours as needed for mild pain (pain score 1-3). What changed: Another medication with the same name was removed. Continue taking this medication, and follow the directions you see here.   albuterol (2.5 MG/3ML) 0.083% nebulizer solution Commonly known as: PROVENTIL Take 3 mLs (2.5 mg total) by nebulization every 6 (six) hours as needed for wheezing or shortness of breath.   albuterol 108 (90 Base) MCG/ACT inhaler Commonly known as: VENTOLIN HFA Inhale 2 puffs into the lungs every 6 (six) hours as needed for wheezing or shortness of breath.   amLODipine 10 MG tablet Commonly known as: NORVASC TAKE 1 TABLET(10 MG) BY MOUTH DAILY   ARTIFICIAL TEARS OP Apply 2 drops to eye daily as needed (for dry eyes).   aspirin EC  81 MG tablet Take 81 mg by mouth daily.   Insulin Pen Needle 32G X 4 MM Misc 1 Container by Does not apply route daily as needed (Injection twice daily).   BD Pen Needle Nano 2nd Gen 32G X 4 MM Misc Generic drug: Insulin Pen Needle Inject 1 Container into the skin as needed.   benzonatate 100 MG capsule Commonly known as: TESSALON Take 1 capsule (100 mg total) by mouth 3 (three) times daily as needed for cough.   bisacodyl 5 MG EC tablet Commonly known as: DULCOLAX Take 5 mg by mouth daily as needed for moderate constipation.   brimonidine 0.15 % ophthalmic solution Commonly known as: ALPHAGAN Place 2 drops into both eyes 2 (two) times daily.   budesonide 0.25 MG/2ML nebulizer solution Commonly known as: PULMICORT Take 2 mLs (0.25 mg total) by nebulization 2 (two) times daily.   budesonide-formoterol 80-4.5 MCG/ACT inhaler Commonly known as: SYMBICORT Inhale 2 puffs into the lungs 2 (two) times daily.   carvedilol 25 MG tablet Commonly known as: COREG Take 1 tablet (25 mg total) by mouth 2 (two) times daily.   cefUROXime 500 MG tablet Commonly known as: CEFTIN Take 1 tablet (500 mg total) by mouth 2 (two) times daily with a meal for 5 doses.   cetirizine 10 MG tablet Commonly known as: ZYRTEC Take 1 tablet (10 mg total) by mouth daily.   clobetasol ointment 0.05 % Commonly known as: TEMOVATE Apply 1 application  topically 2 (two) times daily. Apply under nails   dextromethorphan-guaiFENesin 30-600 MG 12hr tablet Commonly known as: MUCINEX DM Take 1 tablet by mouth 2 (two) times daily. What changed:  when to take this reasons to take this   diclofenac Sodium 1 % Gel Commonly known as: VOLTAREN Apply 4 g topically 4 (four) times daily as needed. What changed: reasons to take this   dicyclomine 10 MG capsule Commonly known as: BENTYL Take 1 capsule (10 mg total) by mouth every 8 (eight) hours as needed for spasms.   dorzolamide 2 % ophthalmic  solution Commonly known as: TRUSOPT Place 2 drops into both eyes 2 (two) times daily.   EXCEDRIN EXTRA STRENGTH PO Take 1 tablet by mouth in the morning and at bedtime.   fluticasone 50 MCG/ACT nasal spray Commonly known as: FLONASE Place 2 sprays into both nostrils 2 (two) times daily.   FreeStyle Libre 3 Reader Devi 1 Device by Combination route See admin instructions.   FreeStyle Libre 3 Sensor Misc Place 1 sensor on the skin every 14 days. Use to check glucose continuously   gabapentin 300 MG capsule Commonly known as: NEURONTIN TAKE 2 CAPSULES BY MOUTH 3 TIMES DAILY   hydrOXYzine 25 MG tablet Commonly known as:  ATARAX Take 1 tablet (25 mg total) by mouth 3 (three) times daily as needed. What changed: reasons to take this   ipratropium 0.03 % nasal spray Commonly known as: ATROVENT Place 2 sprays into both nostrils 3 (three) times daily. What changed:  when to take this reasons to take this   Lantus SoloStar 100 UNIT/ML Solostar Pen Generic drug: insulin glargine Inject 18 Units into the skin daily.   losartan 25 MG tablet Commonly known as: COZAAR Take 1 tablet (25 mg total) by mouth daily.   metoCLOPramide 5 MG tablet Commonly known as: REGLAN Take 1 tablet (5 mg total) by mouth 3 (three) times daily before meals.   nystatin 100000 UNIT/ML suspension Commonly known as: MYCOSTATIN Take 2 mLs (200,000 Units total) by mouth 4 (four) times daily. Apply 1mL to each cheek   nystatin powder Commonly known as: MYCOSTATIN/NYSTOP Apply topically 3 (three) times daily. To affected area   ondansetron 4 MG disintegrating tablet Commonly known as: ZOFRAN-ODT Take 1 tablet (4 mg total) by mouth every 8 (eight) hours as needed for nausea.   ONE TOUCH DELICA LANCING DEV Misc 1 Device by Does not apply route 4 (four) times daily.   oxybutynin 10 MG 24 hr tablet Commonly known as: DITROPAN-XL Take 10 mg by mouth daily.   pantoprazole 40 MG tablet Commonly known as:  PROTONIX Take 1 tablet (40 mg total) by mouth daily. What changed: when to take this   QUEtiapine 200 MG 24 hr tablet Commonly known as: SEROQUEL XR Take 1 tablet (200 mg total) by mouth at bedtime.   rosuvastatin 40 MG tablet Commonly known as: CRESTOR TAKE 1 TABLET(40 MG) BY MOUTH DAILY   Semaglutide(0.25 or 0.5MG /DOS) 2 MG/1.5ML Sopn Inject 0.5 mg into the skin once a week. 0.25 mg once weekly for 4 weeks then increase to 0.5 mg weekly for at least 4 weeks,max 1 mg   SYSTANE OP Place 2 drops into both eyes daily as needed (Dry eyes).   terbinafine 1 % cream Commonly known as: LAMISIL Apply 1 Application topically 2 (two) times daily. Apply under belly   triamcinolone ointment 0.1 % Commonly known as: KENALOG Apply 1 Application topically 2 (two) times daily. What changed:  when to take this reasons to take this   TRUEplus Lancets 33G Misc TEST BLOOD SUGAR 3 TO 4 TIMES DAILY        Discharge Instructions: Please refer to Patient Instructions section of EMR for full details.  Patient was counseled important signs and symptoms that should prompt return to medical care, changes in medications, dietary instructions, activity restrictions, and follow up appointments.   Follow-Up Appointments:  Follow-up Information     [facility] Follow up.   Why: 2/21 @ 4:10PM Contact information: [address] [place] [address] [phone]                Lucia Estelle, Medical Student 06/09/2023, 11:43 AM [facility]   FPTS Upper-Level Resident Addendum   I have independently interviewed and examined the patient. I have discussed the above with [facility] and agree with the documented plan. My edits for correction/addition/clarification are included above. Please see any attending notes.   [name], MD PGY-2, [facility] 06/09/2023 11:50 AM  FPTS Service pager: [phone] (text pages welcome  through [facility])

## 2023-06-09 NOTE — Telephone Encounter (Signed)
Patient calls nurse line requesting refills on eye drops.   Patient states that these were misplaced while she was at the hospital.   Requesting prescriptions be sent to [facility] on [address].   Atha Starks Destan Franchini

## 2023-06-09 NOTE — Progress Notes (Signed)
Mobility Specialist Progress Note:   06/09/23 1030  Mobility  Activity Ambulated with assistance in hallway  Level of Assistance  (MinG)  Assistive Device Four wheel walker  Distance Ambulated (ft) 450 ft  Activity Response Tolerated well  Mobility Referral Yes  Mobility visit 1 Mobility  Mobility Specialist Start Time (ACUTE ONLY) 0912  Mobility Specialist Stop Time (ACUTE ONLY) 0930  Mobility Specialist Time Calculation (min) (ACUTE ONLY) 18 min   Pt received in chair, agreeable to mobility. 1x seated rest break d/t fatigue and bilateral knee pain, otherwise asx throughout. VSS on RA. Pt returned to chair with call bell in reach and all needs met.   Leory Plowman  Mobility Specialist Please contact via [employer] office at [phone]

## 2023-06-09 NOTE — TOC CM/SW Note (Signed)
PT secure chatted NCM . Patient requesting a bedside commode , however PT and OT not recommending due to patient  ambulating 588ft within the unit and is independent with all transfers. NCM added Attending team to secure chat . Attending will defer to PCP

## 2023-06-11 NOTE — Progress Notes (Deleted)
    SUBJECTIVE:   CHIEF COMPLAINT / HPI:  Hospital follow up Issues for follow up: Ensure follow up with VVS for superior mesenteric artery soft plaque vs mural thrombus narrowing MRI with and without contrast for left complex kidney lesion Consider H.pylori testing given symptomology and findings on CTAP  Follow up respiratory status, finishes Cefuroxime 02/20 Consider BMP to reevaluate lytes  PERTINENT  PMH / PSH: ***  OBJECTIVE:   There were no vitals taken for this visit.  General: A&O, NAD HEENT: No sign of trauma, EOM grossly intact Cardiac: RRR, no m/r/g Respiratory: CTAB, normal WOB, no w/c/r GI: Soft, NTTP, non-distended  Extremities: NTTP, no peripheral edema. Neuro: Normal gait, moves all four extremities appropriately. Psych: Appropriate mood and affect   ASSESSMENT/PLAN:   No problem-specific Assessment & Plan notes found for this encounter.     Gerrit Heck, DO [facility]

## 2023-06-12 ENCOUNTER — Inpatient Hospital Stay: Payer: Self-pay | Admitting: Family Medicine

## 2023-06-12 ENCOUNTER — Other Ambulatory Visit (HOSPITAL_COMMUNITY): Payer: Self-pay

## 2023-06-12 DIAGNOSIS — N2889 Other specified disorders of kidney and ureter: Secondary | ICD-10-CM

## 2023-06-12 LAB — CULTURE, BLOOD (ROUTINE X 2)
Culture: NO GROWTH
Culture: NO GROWTH
Special Requests: ADEQUATE
Special Requests: ADEQUATE

## 2023-06-15 ENCOUNTER — Ambulatory Visit (INDEPENDENT_AMBULATORY_CARE_PROVIDER_SITE_OTHER): Payer: Medicare HMO | Admitting: Student

## 2023-06-15 ENCOUNTER — Encounter: Payer: Self-pay | Admitting: Student

## 2023-06-15 ENCOUNTER — Telehealth: Payer: Self-pay

## 2023-06-15 VITALS — BP 132/75 | HR 85 | Ht 63.0 in | Wt 190.0 lb

## 2023-06-15 DIAGNOSIS — R1030 Lower abdominal pain, unspecified: Secondary | ICD-10-CM | POA: Diagnosis not present

## 2023-06-15 DIAGNOSIS — Z720 Tobacco use: Secondary | ICD-10-CM

## 2023-06-15 DIAGNOSIS — N2889 Other specified disorders of kidney and ureter: Secondary | ICD-10-CM

## 2023-06-15 DIAGNOSIS — J4489 Other specified chronic obstructive pulmonary disease: Secondary | ICD-10-CM | POA: Diagnosis not present

## 2023-06-15 MED ORDER — ALBUTEROL SULFATE (2.5 MG/3ML) 0.083% IN NEBU: 2.5000 mg | INHALATION_SOLUTION | Freq: Four times a day (QID) | RESPIRATORY_TRACT | 1 refills | Status: DC | PRN

## 2023-06-15 MED ORDER — NICOTINE POLACRILEX 4 MG MT GUM: CHEWING_GUM | OROMUCOSAL | 0 refills | Status: DC

## 2023-06-15 MED ORDER — GUAIFENESIN ER 600 MG PO TB12: 600.0000 mg | ORAL_TABLET | Freq: Two times a day (BID) | ORAL | 0 refills | Status: DC

## 2023-06-15 NOTE — Telephone Encounter (Signed)
 Pharmacy Patient Advocate Encounter   Received notification from CoverMyMeds that prior authorization for Brimonidine Tartrate 0.15% solution is required/requested.   The patient is insured through [employer] .   What is the diagnosis for this medication?

## 2023-06-15 NOTE — Assessment & Plan Note (Signed)
 Recently seen in the hospital and unclear etiology at this time.  Gastroparesis considered.  Hospital recommended H. pylori testing however patient has been on Protonix and will need to discontinue Protonix for H. pylori testing.  Will hold off on H. pylori testing at this time.  Vascular surgery was consulted during hospitalization and recommended outpatient follow-up to optimize medication management. -Placed referral to vascular surgery

## 2023-06-15 NOTE — Patient Instructions (Addendum)
 Pleasure to meet you today.  For your cough I have sent in prescription for Mucinex which you take 2 times daily.  I have also it would benefit beneficial if you use the nasal saline to help with the runny nose.  I have also placed order for MRI to evaluate your kidney.  Left lesion was seen in the hospital the MRI should help visualize it more.  I have placed referral to a vascular specialist and they will call you to schedule an appointment

## 2023-06-15 NOTE — Assessment & Plan Note (Signed)
 Afebrile today no constitutional symptoms.  Completed antibiotic course.  Still have active cough which is suspected to be due to her COPD and also consistent use of tobacco. -Counseled patient on tobacco cessation -Rx nicotine gum as needed (no more than 20 gums a day); if no improvement will follow-up with Dr. Raymondo Band -Rx Mucinex for cough and sputum clearance -Patient with nasal saline for rhinorrhea

## 2023-06-15 NOTE — Progress Notes (Signed)
    SUBJECTIVE:   CHIEF COMPLAINT / HPI: Hospital follow-up for CAP  73 year old female history of COPD tobacco use Seen in the hospital for CAP after failed treatment with Augmentin and prednisone Outpt Responded well to IV abx Sent home on PO Cefuroxime which she  completed 2/20 She has not had any fever but endorses continued cough Also have running nose, chest congestion and sputum production Sill smoke tobacco but not on oxygen  Abdominal pain Unknown etiology. Seen in the ED, appeared to be discussed with vascular who recommended outpatient follow-up to optimize medical management.  Also was recommended for H. Pylori testing.patient reports normal bowel movements and no blood in stool.    PERTINENT  PMH / PSH: COPD, tobacco use  OBJECTIVE:   BP 132/75   Pulse 85   Ht 5\' 3"  (1.6 m)   Wt 190 lb (86.2 kg)   SpO2 98%   BMI 33.66 kg/m    Physical Exam General: Alert, well appearing, NAD Cardiovascular: RRR, No Murmurs, Normal S2/S2 Respiratory: CTAB, No wheezing or Rales Abdomen: Soft, no distension, mild left quadrant tenderness.  No guarding or rebound tenderness Extremities: No edema on extremities     ASSESSMENT/PLAN:   CAP (community acquired pneumonia) Afebrile today no constitutional symptoms.  Completed antibiotic course.  Still have active cough which is suspected to be due to her COPD and also consistent use of tobacco. -Counseled patient on tobacco cessation -Rx nicotine gum as needed (no more than 20 gums a day); if no improvement will follow-up with Dr. Raymondo Band -Rx Mucinex for cough and sputum clearance -Patient with nasal saline for rhinorrhea  Abdominal pain Recently seen in the hospital and unclear etiology at this time.  Gastroparesis considered.  Hospital recommended H. pylori testing however patient has been on Protonix and will need to discontinue Protonix for H. pylori testing.  Will hold off on H. pylori testing at this time.  Vascular surgery was  consulted during hospitalization and recommended outpatient follow-up to optimize medication management. -Placed referral to vascular surgery   Left Renal Complex mass Incidental findings. Radiology recomment MRI for better characterization of lesion -Placed order for abdominal MRI with and without contrast.     Jerre Simon, MD [facility]

## 2023-06-18 NOTE — Telephone Encounter (Signed)
 Pharmacy Patient Advocate Encounter   PA required; PA submitted to above mentioned insurance via CoverMyMeds Key/confirmation #/EOC [id number]. Status is pending

## 2023-06-19 ENCOUNTER — Telehealth: Payer: Self-pay

## 2023-06-19 NOTE — Telephone Encounter (Signed)
 Called radiology and spoke to [facility] to schedule MRI appointment for patient   Attempted to call patient about upcoming MRI appointment.  Left VM and provided office number to call back about appointment.  If patient calls back please provide patient with information about her appointment   Date and Time: 06/23/2023 @ 12pm, check in time will be at 11:30am  Location: [facility] Nothing to eat 4 hours prior to appointment.  Drusilla Kanner, CMA

## 2023-06-19 NOTE — Telephone Encounter (Signed)
 Please close due to 3 unsuccessful outreaches with RNCM on 05/28/23, 04/21/23 and 12/20  [name] [name]  [facility] Guide  Direct Dial: [phone]  Fax [phone]

## 2023-06-19 NOTE — Telephone Encounter (Signed)
 Pharmacy Patient Advocate Encounter  Received notification from [facility] that Prior Authorization for BRIMONIDINE has been DENIED.  Full denial letter will be uploaded to the media tab. See denial reason below.  The drug you asked for is not listed in your preferred drug list (formulary). The preferred drug(s), you may not have tried, are: Combigan eye drops Rhopressa eye drops Simbrinza eye drops suspension. Your provider needs to give [place] medical reasons why the preferred drug(s) would not work for you and/or would have bad side effects. Also, you asked for the drug above for 5 mLs per 15 days. This is more than the allowed amount of 5 mLs per 25 days.  (Using 2 drops in both eyes twice daily. 5ml bottle will last patient about 12 days).  PA #/Case ID/Reference #: [id number]

## 2023-06-22 ENCOUNTER — Telehealth: Payer: Self-pay | Admitting: Internal Medicine

## 2023-06-22 NOTE — Telephone Encounter (Addendum)
   Patient Name: [name] [name]  DOB: 12-16-1950 MRN: [id number]  Primary Cardiologist: Chrystie Nose, MD  Chart reviewed as part of pre-operative protocol coverage. Given past medical history and time since last visit, based on ACC/AHA guidelines, Melenda Lewis is at acceptable risk for the planned procedure without further cardiovascular testing.   Pt was seen by Joni Reining, NP on 06/04/2023 and was doing well, able to complete greater than 4 METS without difficulty.   Patient takes aspirin for history of stroke. This is not managed by cardiology.  Recommendations for holding aspirin prior to procedure should come from managing provider (PCP versus neurology).  SBE prophylaxis is NOT required for this patient.   I will route this recommendation to the requesting party via Epic fax function and remove from pre-op pool.  Please call with questions.  Joylene Grapes, NP 06/22/2023, 3:43 PM

## 2023-06-22 NOTE — Telephone Encounter (Signed)
 I will forward to preop APP to review if the pt has been cleared.

## 2023-06-22 NOTE — Telephone Encounter (Signed)
 Patient is following up regarding clearance for dental work. She states she's in a lot of pain and would like to have cardiology recommendation faxed to Dr. Chipper Herb office as soon as possible. Pre-op appointment was on 2/13, but patient states they will not be able to schedule her until clearance is received.

## 2023-06-23 ENCOUNTER — Ambulatory Visit (HOSPITAL_COMMUNITY): Payer: Medicare HMO

## 2023-06-24 ENCOUNTER — Other Ambulatory Visit: Payer: Self-pay | Admitting: Occupational Therapy

## 2023-06-25 NOTE — Patient Outreach (Signed)
 Aging Gracefully Program  OT Follow-Up Visit  06/25/2023  [name] [name] 1950-11-25 [id number]  Visit:  3- Third Visit  Start Time:  1600 End Time:  1645 Total Minutes:  45  Readiness to Change Score :  Readiness to Change Score: 10    Goals:   Goals Addressed             This Visit's Progress    Patient Stated       She would like to feel more safe in her bathrooms. Downstairs bath could benefit from a higher toilet with grab bars on each side, widen door way, and fixing the sink so that it does not pull away/fall off the wall). If she is able to get up the inside steps safely then the full bath at the top of the steps could also use a higher toilet (with 3n1 or toilet handles) and a step in shower with hand held shower with lower holder, grab bars, and a shower seat.  06/24/2022 Partially met: downstairs bathroom completed, upstairs bathroom still needs work (only partially finished)     Patient Stated       She would like to feel more safe in and out of her front and back doors. Front: ramp that leads up to flush with entry into house and then a side ramp to go onto the rest of her porch. Back: Look at [employer] for safety. 06/24/2022 Partially met--front entrance ramp completed. Back door--deck was removed so now there is a step out onto the concrete slab that client cannot safely step in and out of. This issue was relayed in an email to [facility].     Patient Stated       Safety and ability to go up and down her inside steps. Grab bar on the right hand side as you go up (one top of the half wall that is 1/2 way up and then on the wall the rest of the way up). Pt asked about a chair lift--will put on request list but made her aware this more than likely will not be a possibility,  06/24/2022 MET        Post Clinical Reasoning: Clinician View Of Client Situation:: Ms Neuwirth is finally feeling better after 3 weeks of being sick. She has her rollator which  was facilitated by her HHPT and says that it really comes in handy. Her walk in shower is finished but there is not a shower curtain on it. Also the comfort height toilet upstairs has not been installed nor handles for it. Client View Of His/Her Situation:: Happy that she can now make it upstairs. Waiting on upstairs bathroom to be completely finished. Next Visit Plan:: Follow up on further work.  Lindon Romp OT Acute Rehabilitation Services Office [phone]

## 2023-06-26 ENCOUNTER — Ambulatory Visit (HOSPITAL_COMMUNITY)
Admission: RE | Admit: 2023-06-26 | Discharge: 2023-06-26 | Disposition: A | Discharge status: Home / Self Care | Source: Ambulatory Visit | Attending: Family Medicine | Admitting: Family Medicine

## 2023-06-26 DIAGNOSIS — Z9049 Acquired absence of other specified parts of digestive tract: Secondary | ICD-10-CM | POA: Diagnosis not present

## 2023-06-26 DIAGNOSIS — N2889 Other specified disorders of kidney and ureter: Secondary | ICD-10-CM | POA: Diagnosis not present

## 2023-06-26 DIAGNOSIS — I700 Atherosclerosis of aorta: Secondary | ICD-10-CM | POA: Diagnosis not present

## 2023-06-26 MED ORDER — GADOBUTROL 1 MMOL/ML IV SOLN
8.5000 mL | Freq: Once | INTRAVENOUS | Status: AC | PRN
  Administered 2023-06-26: 8.5 mL via INTRAVENOUS

## 2023-06-29 ENCOUNTER — Other Ambulatory Visit: Payer: Self-pay

## 2023-06-29 ENCOUNTER — Encounter (HOSPITAL_COMMUNITY): Payer: Self-pay | Admitting: Oral Surgery

## 2023-06-29 NOTE — Anesthesia Preprocedure Evaluation (Signed)
 Anesthesia Evaluation  Patient identified by MRN, date of birth, ID band Patient awake    Reviewed: Allergy & Precautions, NPO status , Patient's Chart, lab work & pertinent test results, reviewed documented beta blocker date and time   History of Anesthesia Complications Negative for: history of anesthetic complications  Airway Mallampati: II  TM Distance: >3 FB Neck ROM: Full    Dental  (+) Poor Dentition, Edentulous Upper, Missing,    Pulmonary COPD,  COPD inhaler, Current Smoker and Patient abstained from smoking.   Pulmonary exam normal        Cardiovascular hypertension, Pt. on medications and Pt. on home beta blockers + CAD and +CHF  Normal cardiovascular exam  TTE 05/08/23: 1. Left ventricular ejection fraction, by estimation, is >75%. The left  ventricle has hyperdynamic function. The left ventricle has no regional  wall motion abnormalities. There is severe eccentric left ventricular  hypertrophy. Left ventricular diastolic   parameters are indeterminate.   2. Right ventricular systolic function is normal. The right ventricular  size is normal.   3. A small pericardial effusion is present. The pericardial effusion is  circumferential. There is no evidence of cardiac tamponade.   4. The mitral valve is normal in structure. Trivial mitral valve  regurgitation. No evidence of mitral stenosis.   5. The aortic valve is grossly normal. There is mild calcification of the  aortic valve. There is mild thickening of the aortic valve. Aortic valve  regurgitation is not visualized. Aortic valve sclerosis/calcification is  present, without any evidence of  aortic stenosis.   6. There is Moderate (Grade III) protruding plaque involving the  descending aorta.   7. The inferior vena cava is normal in size with greater than 50%  respiratory variability, suggesting right atrial pressure of 3 mmHg.     Neuro/Psych  Headaches   Anxiety Depression    CVA    GI/Hepatic Neg liver ROS, hiatal hernia,GERD  Medicated,,  Endo/Other  diabetes, Type 2, Insulin Dependent    Renal/GU negative Renal ROS  negative genitourinary   Musculoskeletal  (+) Arthritis ,  Fibromyalgia -  Abdominal   Peds  Hematology negative hematology ROS (+)   Anesthesia Other Findings Day of surgery medications reviewed with patient.  Reproductive/Obstetrics negative OB ROS                             Anesthesia Physical Anesthesia Plan  ASA: 3  Anesthesia Plan: General   Post-op Pain Management: Tylenol PO (pre-op)*   Induction: Intravenous  PONV Risk Score and Plan: 2 and Ondansetron, Dexamethasone and Treatment may vary due to age or medical condition  Airway Management Planned: Nasal ETT  Additional Equipment: None  Intra-op Plan:   Post-operative Plan: Extubation in OR  Informed Consent: I have reviewed the patients History and Physical, chart, labs and discussed the procedure including the risks, benefits and alternatives for the proposed anesthesia with the patient or authorized representative who has indicated his/her understanding and acceptance.     Dental advisory given  Plan Discussed with: CRNA  Anesthesia Plan Comments:        Anesthesia Quick Evaluation

## 2023-06-29 NOTE — Progress Notes (Signed)
 SDW CALL  Patient was given pre-op instructions over the phone. The opportunity was given for the patient to ask questions. No further questions asked. Patient verbalized understanding of instructions given.   PCP - Unknown Foley Mahmood,MD Cardiologist - Loann Quill  PPM/ICD - denies Device Orders -  Rep Notified -   Chest x-ray -  EKG - 06/08/23 Stress Test - 08/06/20 ECHO - 05/08/23 Cardiac Cath - 11/10/11  Sleep Study - denies CPAP -   Fasting Blood Sugar - 170-200 Checks Blood Sugar -pt uses Freestyle Libre CGM to check her blood sugar but has removed it. She states she will stick her finger day of surgery to check her blood sugar.  Blood Thinner Instructions:na  Aspirin Instructions:hold day of surgery  ERAS Protcol - patient may have water until 0900.  PRE-SURGERY Ensure or G2- no  COVID TEST- na   Anesthesia review: yes- Informed Dr. Jairo Ben of pt's hospitalization from 2/16-2/18/25 for community acquired pneumonia. After discharge from the hospital, pt was seen by her PCP for hospital follow up on 06/15/23. She was prescribed Mucinex for continued cough. During the pre op phone call, patient was not heard to cough and she reported only occasional cough at this time with clear sputum. She denied any fever,chills,shortness of breath,body aches. Per Dr. Jean Rosenthal, patient should be okay to proceed with surgery.    Surgical Instructions    Your procedure is scheduled on March 11  Report to [facility] Main Entrance "A" at 0930 A.M., then check in with the Admitting office.  Call this number if you have problems the morning of surgery:  [phone]    Remember:  Do not eat after midnight the night before your surgery  You may drink clear liquids until 0900am  the morning of your surgery.   Clear liquids allowed are: Water   Take these medicines the morning of surgery with A SIP OF WATER: Albuterol nebulizer,Amlodipine,Coreg,Zyrtec,Trusopt eye gtt,  gabapentin,protonix. PRN- Reglan,Zofran. Bring Albuterol inhaler.   As of today, STOP taking any Aspirin (unless otherwise instructed by your surgeon) Aleve, Naproxen, Ibuprofen, Motrin, Advil, Goody's, BC's, all herbal medications, fish oil, and all vitamins.  WHAT DO I DO ABOUT MY DIABETES MEDICATION?   THE NIGHT BEFORE SURGERY, take 9 units of Lantus insulin.      THE MORNING OF SURGERY, take 9 units of Lantus insulin.    Check your blood sugar the morning of your surgery when you wake up and every 2 hours until you get to the Short Stay unit.    [facility] is not responsible for any belongings or valuables. .   Do NOT Smoke (Tobacco/Vaping)  24 hours prior to your procedure  If you use a CPAP at night, you may bring your mask for your overnight stay.   Contacts, glasses, hearing aids, dentures or partials may not be worn into surgery, please bring cases for these belongings   Patients discharged the day of surgery will not be allowed to drive home, and someone needs to stay with them for 24 hours.    Special instructions:    Oral Hygiene is also important to reduce your risk of infection.  Remember - BRUSH YOUR TEETH THE MORNING OF SURGERY WITH YOUR REGULAR TOOTHPASTE   Day of Surgery:  Take a shower the day of or night before with antibacterial soap. Wear Clean/Comfortable clothing the morning of surgery Do not apply any deodorants/lotions.   Do not wear jewelry or makeup Do not wear lotions, powders,  perfumes/colognes, or deodorant. Do not shave 48 hours prior to surgery.  Men may shave face and neck. Do not bring valuables to the hospital. Do not wear nail polish, gel polish, artificial nails, or any other type of covering on natural nails (fingers and toes) If you have artificial nails or gel coating that need to be removed by a nail salon, please have this removed prior to surgery. Artificial nails or gel coating may interfere with anesthesia's ability to  adequately monitor your vital signs. Remember to brush your teeth WITH YOUR REGULAR TOOTHPASTE.

## 2023-06-29 NOTE — H&P (Signed)
   Patient: [name] [name]  PID: [id number]  DOB: 08/08/1950  SEX: Female   Self Referral  CC: Painful bottom teeth.  Past Medical History:  Diabetes, High Blood Pressure, Irregular Heart Beat, chronic cough, Smoker, Swollen ankles, Eye Disease or Glaucoma, Obese, Pancreatitis, GERD, CHF, Chronic Pain, Asthma, Bronchitis, Difficult breathing    Medications: Acetaminophen, Albuterol, Amlodipine, ASA, Budesonide, Carvedilol, Zyrtec, Temovate, Mucinex, Colace, Pepcid, Flonase, Neurontin, Guaifenisen, Atarax, Lantus solostar, Atrovent, Cozaar, Robaxin, Citrucel, Nystatin, Zofran, Seroquel, Crestor, Hydroxyzine, Oxybutynin, Certizine, Gabapentin, Symbicort    Allergies:     Desvenlafaxine, Duloxetine, Latex, Tramadol, Levofloxacin, Lithium    Surgeries:   Eye surgery, Oral Surgery, Hysterectomy, Gallbladder surgery, esophagus dilation, Colonoscopy     Social History       Smoking:            Alcohol: Drug use:                             Exam: BMI 35. Edentulous maxilla. Mandibular remaining teeth # 22, 23, 24, 25, 26, 27 with gingival calculus and bone loss.   No purulence, edema, fluctuance, trismus. Oral cancer screening negative. Pharynx clear. No lymphadenopathy.  Panorex:Edentulous maxilla. Mandibular remaining teeth # 22, 23, 24, 25, 26, 27 with  bone loss.   Assessment: ASA 3. Non-restorable   teeth # 22, 23, 24, 25, 26, 27             Plan: 1. MD Clearance  2. Extraction Teeth # 22, 23, 24, 25, 26, 27    Hospital Day surgery.                 Rx: n               Risks and complications explained. Questions answered.   Georgia Lopes, DMD

## 2023-06-30 ENCOUNTER — Ambulatory Visit (HOSPITAL_COMMUNITY): Payer: Self-pay | Admitting: Anesthesiology

## 2023-06-30 ENCOUNTER — Ambulatory Visit (HOSPITAL_BASED_OUTPATIENT_CLINIC_OR_DEPARTMENT_OTHER): Payer: Self-pay | Admitting: Anesthesiology

## 2023-06-30 ENCOUNTER — Encounter (HOSPITAL_COMMUNITY): Payer: Self-pay | Admitting: Oral Surgery

## 2023-06-30 ENCOUNTER — Encounter (HOSPITAL_COMMUNITY)
Admission: RE | Disposition: A | Discharge status: Home / Self Care | Payer: Self-pay | Source: Home / Self Care | Attending: Oral Surgery

## 2023-06-30 ENCOUNTER — Other Ambulatory Visit: Payer: Self-pay

## 2023-06-30 ENCOUNTER — Ambulatory Visit (HOSPITAL_COMMUNITY)
Admission: RE | Admit: 2023-06-30 | Discharge: 2023-06-30 | Disposition: A | Discharge status: Home / Self Care | Attending: Oral Surgery | Admitting: Oral Surgery

## 2023-06-30 DIAGNOSIS — K056 Periodontal disease, unspecified: Secondary | ICD-10-CM | POA: Diagnosis not present

## 2023-06-30 DIAGNOSIS — I110 Hypertensive heart disease with heart failure: Secondary | ICD-10-CM | POA: Diagnosis not present

## 2023-06-30 DIAGNOSIS — K029 Dental caries, unspecified: Secondary | ICD-10-CM

## 2023-06-30 DIAGNOSIS — K0850 Unsatisfactory restoration of tooth, unspecified: Secondary | ICD-10-CM | POA: Diagnosis not present

## 2023-06-30 DIAGNOSIS — I2510 Atherosclerotic heart disease of native coronary artery without angina pectoris: Secondary | ICD-10-CM | POA: Diagnosis not present

## 2023-06-30 DIAGNOSIS — I509 Heart failure, unspecified: Secondary | ICD-10-CM | POA: Diagnosis not present

## 2023-06-30 DIAGNOSIS — F17200 Nicotine dependence, unspecified, uncomplicated: Secondary | ICD-10-CM | POA: Diagnosis not present

## 2023-06-30 HISTORY — PX: TOOTH EXTRACTION: SHX859

## 2023-06-30 LAB — GLUCOSE, CAPILLARY
Glucose-Capillary: 146 mg/dL — ABNORMAL HIGH (ref 70–99)
Glucose-Capillary: 133 mg/dL — ABNORMAL HIGH (ref 70–99)
Glucose-Capillary: 147 mg/dL — ABNORMAL HIGH (ref 70–99)

## 2023-06-30 SURGERY — DENTAL RESTORATION/EXTRACTIONS
Anesthesia: General

## 2023-06-30 MED ORDER — CHLORHEXIDINE GLUCONATE 0.12 % MT SOLN
OROMUCOSAL | Status: AC
  Administered 2023-06-30: 15 mL via OROMUCOSAL
  Filled 2023-06-30: qty 15

## 2023-06-30 MED ORDER — OXYMETAZOLINE HCL 0.05 % NA SOLN
NASAL | Status: DC | PRN
  Administered 2023-06-30: 1 via TOPICAL

## 2023-06-30 MED ORDER — ROCURONIUM BROMIDE 10 MG/ML (PF) SYRINGE
PREFILLED_SYRINGE | INTRAVENOUS | Status: DC | PRN
  Administered 2023-06-30: 60 mg via INTRAVENOUS

## 2023-06-30 MED ORDER — LACTATED RINGERS IV SOLN: INTRAVENOUS | Status: DC

## 2023-06-30 MED ORDER — CHLORHEXIDINE GLUCONATE 0.12 % MT SOLN: 15.0000 mL | Freq: Once | OROMUCOSAL | Status: AC

## 2023-06-30 MED ORDER — DEXAMETHASONE SODIUM PHOSPHATE 10 MG/ML IJ SOLN
INTRAMUSCULAR | Status: DC | PRN
  Administered 2023-06-30: 10 mg via INTRAVENOUS

## 2023-06-30 MED ORDER — LIDOCAINE-EPINEPHRINE 2 %-1:100000 IJ SOLN
INTRAMUSCULAR | Status: AC
  Filled 2023-06-30: qty 1

## 2023-06-30 MED ORDER — OXYMETAZOLINE HCL 0.05 % NA SOLN
NASAL | Status: AC
  Filled 2023-06-30: qty 30

## 2023-06-30 MED ORDER — FENTANYL CITRATE (PF) 100 MCG/2ML IJ SOLN
25.0000 ug | INTRAMUSCULAR | Status: DC | PRN
Start: 2023-06-30 — End: 2023-06-30

## 2023-06-30 MED ORDER — ACETAMINOPHEN 500 MG PO TABS
1000.0000 mg | ORAL_TABLET | Freq: Once | ORAL | Status: AC
  Administered 2023-06-30: 1000 mg via ORAL
  Filled 2023-06-30: qty 2

## 2023-06-30 MED ORDER — LIDOCAINE-EPINEPHRINE 2 %-1:100000 IJ SOLN
INTRAMUSCULAR | Status: DC | PRN
  Administered 2023-06-30: 10 mL

## 2023-06-30 MED ORDER — OXYCODONE HCL 5 MG PO TABS: 5.0000 mg | ORAL_TABLET | Freq: Once | ORAL | Status: DC | PRN

## 2023-06-30 MED ORDER — OXYCODONE-ACETAMINOPHEN 5-325 MG PO TABS: 1.0000 | ORAL_TABLET | Freq: Four times a day (QID) | ORAL | 0 refills | Status: DC | PRN

## 2023-06-30 MED ORDER — OXYCODONE HCL 5 MG/5ML PO SOLN: 5.0000 mg | Freq: Once | ORAL | Status: DC | PRN

## 2023-06-30 MED ORDER — SODIUM CHLORIDE 0.9 % IR SOLN
Status: DC | PRN
  Administered 2023-06-30: 1000 mL

## 2023-06-30 MED ORDER — ORAL CARE MOUTH RINSE: 15.0000 mL | Freq: Once | OROMUCOSAL | Status: AC

## 2023-06-30 MED ORDER — OXYMETAZOLINE HCL 0.05 % NA SOLN
NASAL | Status: DC | PRN
  Administered 2023-06-30: 2 via NASAL

## 2023-06-30 MED ORDER — FENTANYL CITRATE (PF) 250 MCG/5ML IJ SOLN
INTRAMUSCULAR | Status: AC
  Filled 2023-06-30: qty 5

## 2023-06-30 MED ORDER — FENTANYL CITRATE (PF) 250 MCG/5ML IJ SOLN
INTRAMUSCULAR | Status: DC | PRN
  Administered 2023-06-30: 100 ug via INTRAVENOUS

## 2023-06-30 MED ORDER — LIDOCAINE 2% (20 MG/ML) 5 ML SYRINGE
INTRAMUSCULAR | Status: DC | PRN
  Administered 2023-06-30: 100 mg via INTRAVENOUS

## 2023-06-30 MED ORDER — SUGAMMADEX SODIUM 200 MG/2ML IV SOLN
INTRAVENOUS | Status: DC | PRN
  Administered 2023-06-30: 200 mg via INTRAVENOUS

## 2023-06-30 MED ORDER — 0.9 % SODIUM CHLORIDE (POUR BTL) OPTIME
TOPICAL | Status: DC | PRN
  Administered 2023-06-30: 1000 mL

## 2023-06-30 MED ORDER — AMOXICILLIN 500 MG PO CAPS: 500.0000 mg | ORAL_CAPSULE | Freq: Two times a day (BID) | ORAL | 0 refills | Status: DC

## 2023-06-30 MED ORDER — ONDANSETRON HCL 4 MG/2ML IJ SOLN
INTRAMUSCULAR | Status: DC | PRN
  Administered 2023-06-30: 4 mg via INTRAVENOUS

## 2023-06-30 MED ORDER — PROPOFOL 10 MG/ML IV BOLUS
INTRAVENOUS | Status: DC | PRN
  Administered 2023-06-30: 160 mg via INTRAVENOUS

## 2023-06-30 MED ORDER — DROPERIDOL 2.5 MG/ML IJ SOLN: 0.6250 mg | Freq: Once | INTRAMUSCULAR | Status: DC | PRN

## 2023-06-30 MED ORDER — OXYMETAZOLINE HCL 0.05 % NA SOLN
NASAL | Status: AC
  Filled 2023-06-30: qty 60

## 2023-06-30 MED ORDER — CEFAZOLIN SODIUM-DEXTROSE 2-4 GM/100ML-% IV SOLN
2.0000 g | INTRAVENOUS | Status: AC
  Administered 2023-06-30: 2 g via INTRAVENOUS
  Filled 2023-06-30: qty 100

## 2023-06-30 MED ORDER — INSULIN ASPART 100 UNIT/ML IJ SOLN: 0.0000 [IU] | INTRAMUSCULAR | Status: DC | PRN

## 2023-06-30 SURGICAL SUPPLY — 26 items
BAG COUNTER SPONGE SURGICOUNT (BAG) IMPLANT
BLADE SURG 15 STRL LF DISP TIS (BLADE) ×1 IMPLANT
BUR CROSS CUT FISSURE 1.6 (BURR) ×1 IMPLANT
BUR EGG ELITE 4.0 (BURR) ×1 IMPLANT
CANISTER SUCT 3000ML PPV (MISCELLANEOUS) ×1 IMPLANT
COVER SURGICAL LIGHT HANDLE (MISCELLANEOUS) ×1 IMPLANT
GAUZE PACKING FOLDED 2 STR (GAUZE/BANDAGES/DRESSINGS) ×1 IMPLANT
GLOVE BIO SURGEON STRL SZ8 (GLOVE) ×1 IMPLANT
GOWN STRL REUS W/ TWL LRG LVL3 (GOWN DISPOSABLE) ×1 IMPLANT
GOWN STRL REUS W/ TWL XL LVL3 (GOWN DISPOSABLE) ×1 IMPLANT
IV NS 1000ML BAXH (IV SOLUTION) ×1 IMPLANT
KIT BASIN OR (CUSTOM PROCEDURE TRAY) ×1 IMPLANT
KIT TURNOVER KIT B (KITS) ×1 IMPLANT
NDL HYPO 25GX1X1/2 BEV (NEEDLE) ×2 IMPLANT
NEEDLE HYPO 25GX1X1/2 BEV (NEEDLE) ×2 IMPLANT
NS IRRIG 1000ML POUR BTL (IV SOLUTION) ×1 IMPLANT
PAD ARMBOARD 7.5X6 YLW CONV (MISCELLANEOUS) ×1 IMPLANT
SLEEVE IRRIGATION ELITE 7 (MISCELLANEOUS) ×1 IMPLANT
SPIKE FLUID TRANSFER (MISCELLANEOUS) ×1 IMPLANT
SPONGE SURGIFOAM ABS GEL 12-7 (HEMOSTASIS) IMPLANT
SUT PLAIN 3 0 PS2 27 (SUTURE) ×1 IMPLANT
SYR BULB IRRIG 60ML STRL (SYRINGE) ×1 IMPLANT
SYR CONTROL 10ML LL (SYRINGE) ×1 IMPLANT
TRAY ENT MC OR (CUSTOM PROCEDURE TRAY) ×1 IMPLANT
TUBING IRRIGATION (MISCELLANEOUS) ×1 IMPLANT
YANKAUER SUCT BULB TIP NO VENT (SUCTIONS) ×1 IMPLANT

## 2023-06-30 NOTE — Progress Notes (Signed)
 PACU Nursing Note: Alert and oriented, VSS, utilizing gauze in mouth to aid in bleeding controlled, no complaints of pain or discomfort, no increased swallowing noted throughtout PACU phase. Trachea sounds clear, no difficulty in swallowing, speech WNL for pt/ procedure. Able to follow commands without difficulty as well. Ready for DC to home. As per MD post procedure orders. Escorted to exit via [facility], family member at bedside, copy of AVS provided as well.

## 2023-06-30 NOTE — Addendum Note (Signed)
 Addendum  created 06/30/23 1423 by Ardean Larsen, CRNA   Intraprocedure Meds edited

## 2023-06-30 NOTE — Transfer of Care (Signed)
 Immediate Anesthesia Transfer of Care Note  Patient: [name] [name]  Procedure(s) Performed: DENTAL RESTORATION/EXTRACTIONS  Patient Location: PACU  Anesthesia Type:General  Level of Consciousness: sedated  Airway & Oxygen Therapy: Patient Spontanous Breathing  Post-op Assessment: Report given to RN  Post vital signs: Reviewed and stable  Last Vitals:  Vitals Value Taken Time  BP 157/77 06/30/23 1330  Temp    Pulse 61 06/30/23 1334  Resp 17 06/30/23 1334  SpO2 97 % 06/30/23 1334  Vitals shown include unfiled device data.  Last Pain:  Vitals:   06/30/23 1015  TempSrc:   PainSc: 10-Worst pain ever         Complications: No notable events documented.

## 2023-06-30 NOTE — Op Note (Signed)
 [name] [name], DUDA MEDICAL RECORD NO: [id number] ACCOUNT NO: [id number] DATE OF BIRTH: 1951-02-18 FACILITY: [facility] LOCATION: [facility] PHYSICIAN: Georgia Lopes, DDS  Operative Report   DATE OF PROCEDURE: 06/30/2023  PREOPERATIVE DIAGNOSES: 1. Non-restorable teeth #22, 23, 24, 25, 26, and 27. 2. Secondary to dental caries and periodontal disease.  POSTOPERATIVE DIAGNOSES: 1. Non-restorable teeth #22, 23, 24, 25, 26, and 27. 2. Secondary to dental caries and periodontal disease.  PROCEDURE: Extraction of teeth #22, 23, 24, 25, 26, and 27.  SURGEON: Georgia Lopes, DDS  ANESTHESIA: General.  Nasal intubation, Dr. Stephannie Peters.  ATTENDING PROCEDURE: The patient was taken to the operating room and placed on the table in supine position. General anesthesia was administered. A nasal endotracheal tube was placed and secured. The eyes were protected and the patient was draped for  surgery. Timeout was performed. The posterior pharynx was suctioned and a throat pack was placed. 2% lidocaine 1:100,000 epinephrine was infiltrated. An inferior alveolar block on the right and left sides and in buccal anterior infiltration of the  mandible. A total of 10 mL was utilized. A bite block was placed in the left side of the mouth. A sweetheart retractor was used to retract the tongue. A #15 blade was used to make an incision in the buccal and lingual sulcus of teeth #22 through #27. The  periosteum was reflected. The teeth were elevated with a 301 elevator and removed with dental forceps. The sockets were curetted. The tissue was trimmed to allow for primary closure. The bone file was used to smooth the bone and then the area was  irrigated and closed with 3-0 chromic. The oral cavity was then irrigated and suctioned. The throat pack was removed. The patient was left in care of anesthesia for extubation and transport to recovery with plans for discharge home through day surgery.  ESTIMATED BLOOD LOSS:  Minimal  COMPLICATIONS: None  SPECIMENS: None  COUNTS:  Correct.   SHY D: 06/30/2023 1:12:36 pm T: 06/30/2023 2:18:00 pm  JOB: [id number]/ [id number]

## 2023-06-30 NOTE — Anesthesia Postprocedure Evaluation (Signed)
 Anesthesia Post Note  Patient: [name] [name]  Procedure(s) Performed: DENTAL RESTORATION/EXTRACTIONS     Patient location during evaluation: PACU Anesthesia Type: General Level of consciousness: awake and alert Pain management: pain level controlled Vital Signs Assessment: post-procedure vital signs reviewed and stable Respiratory status: spontaneous breathing, nonlabored ventilation and respiratory function stable Cardiovascular status: blood pressure returned to baseline Postop Assessment: no apparent nausea or vomiting Anesthetic complications: no   No notable events documented.  Last Vitals:  Vitals:   06/30/23 1345 06/30/23 1400  BP: (!) 144/72   Pulse:    Resp:    Temp:    SpO2: 94% 94%    Last Pain:  Vitals:   06/30/23 1400  TempSrc:   PainSc: 0-No pain                 Shanda Howells

## 2023-06-30 NOTE — H&P (Signed)
 H&P documentation  -History and Physical Reviewed  -Patient has been re-examined  -No change in the plan of care  [name] [name]

## 2023-06-30 NOTE — Discharge Instructions (Signed)

## 2023-06-30 NOTE — Op Note (Signed)
 06/30/2023  1:09 PM  PATIENT:  [name]  73 y.o. female  PRE-OPERATIVE DIAGNOSIS:  NON-RESTORABLE TEETH # 22, 23, 24, 25, 26, 27 SECONDARY TO DENTAL CARIES AND PERIODONTAL DISEASE  POST-OPERATIVE DIAGNOSIS:  SAME  PROCEDURE:  Procedure(s): DENTAL EXTRACTIONS TEETH # 22, 23, 24, 25, 26, 27   SURGEON:  Surgeon(s): Ocie Doyne, DMD  ANESTHESIA:   local and general  EBL:  minimal  DRAINS: none   SPECIMEN:  No Specimen  COUNTS:  YES  PLAN OF CARE: Discharge to home after PACU  PATIENT DISPOSITION:  PACU - hemodynamically stable.   PROCEDURE DETAILS: Dictation # [id number]  Georgia Lopes, DMD 06/30/2023 1:09 PM

## 2023-07-01 ENCOUNTER — Encounter (HOSPITAL_COMMUNITY): Payer: Self-pay | Admitting: Oral Surgery

## 2023-07-01 ENCOUNTER — Ambulatory Visit: Payer: Medicare HMO | Admitting: Family Medicine

## 2023-07-06 ENCOUNTER — Telehealth: Payer: Self-pay | Admitting: Licensed Clinical Social Worker

## 2023-07-06 NOTE — Patient Outreach (Signed)
 Care Coordination   Patient called in  Visit Note   07/06/2023 Name: Jacqueline Spofford MRN: [id number] DOB: September 28, 1950  Anel Creighton is a 73 y.o. year old female who sees [name], MD for primary care. I spoke with  [name] by phone today.  What matters to the patients health and wellness today?  Utility assistance    Goals Addressed   None     SDOH assessments and interventions completed:  Yes     Care Coordination Interventions:  Yes, provided  Interventions Today    Flowsheet Row Most Recent Value  General Interventions   General Interventions Discussed/Reviewed General Interventions Discussed, Community Resources  [Utility assistance list needed for patient, SW will mail]        Follow up plan: No further intervention required.   Encounter Outcome:  Patient Visit Completed   Jeanie Cooks, PhD [facility], [facility] Social Worker Direct Dial: [phone]  Fax: [phone]

## 2023-07-09 ENCOUNTER — Encounter: Payer: Self-pay | Admitting: Student

## 2023-07-09 ENCOUNTER — Telehealth: Payer: Self-pay | Admitting: Student

## 2023-07-09 ENCOUNTER — Telehealth: Payer: Self-pay

## 2023-07-09 DIAGNOSIS — N2889 Other specified disorders of kidney and ureter: Secondary | ICD-10-CM

## 2023-07-09 NOTE — Telephone Encounter (Signed)
 Called patient to inform her result of her MRI for Renal mass was concerning for lesion that could potentially be malignant and would need referral to urologist for further assessment of the mass. Patient was amendable to Urology referral.  Renal MRI read: Concerns for Bosniak category III lesion, suspicious for cystic renal cell carcinoma with intermediate risk of being malignant.  Jerre Simon, MD PGY-3, [facility] Family Medicine Resident  Please page [phone] with questions.

## 2023-07-09 NOTE — Telephone Encounter (Signed)
-----   Message from Jerre Simon sent at 07/09/2023  9:29 AM EDT ----- Regarding: Schedule appointment Just got of the phone with this patient and she said she's been calling to try and schedule an appointment but not getting through. Her PCP is Atif. Please call her to schedule an appointment.  JN

## 2023-07-09 NOTE — Telephone Encounter (Signed)
 Spoke with patient. Made appt with PCP for 3/27 at 3:30. I did offer earlier appt with different provider. Patient stated she would hold off until the appt. I did advise to go to ER or Urgent Care if pain gets worse. Aquilla Solian, CMA

## 2023-07-09 NOTE — Telephone Encounter (Signed)
 Attempted to reach patient. No answer. LVM for patient to call the office to make appt with PCP. I will try l later. Aquilla Solian, CMA

## 2023-07-14 ENCOUNTER — Other Ambulatory Visit: Admitting: Occupational Therapy

## 2023-07-15 NOTE — Patient Outreach (Signed)
 Aging Gracefully Program  OT FINAL Visit  07/15/2023  [name] [name] 1950/10/27 [id number]  Visit:  4- Fourth Visit  Start Time:  1600 End Time:  1700 Total Minutes:  60  Readiness to Change:  Readiness to Change Score: 10  Patient Education: Education Provided: Yes Education Details: getting up from falls, safety at home booklet (sent to patient) Person(s) Educated: Patient  Goals:  Goals Addressed             This Visit's Progress    COMPLETED: Patient Stated       She would like to feel more safe in her bathrooms. Downstairs bath could benefit from a higher toilet with grab bars on each side, widen door way, and fixing the sink so that it does not pull away/fall off the wall). If she is able to get up the inside steps safely then the full bath at the top of the steps could also use a higher toilet (with 3n1 or toilet handles) and a step in shower with hand held shower with lower holder, grab bars, and a shower seat.  06/24/2022 Partially met: downstairs bathroom completed, upstairs bathroom still needs work (only partially finished)  07/14/2023 MET Name: [name] [name]                           Date: 07/14/2023   OT ACTION PLAN: Bathing   Target Problem Area:   Safety in bathroom    Why Problem May Occur:      No shower/tub downstairs  Low toilet downstairs and no grab bar  Sink in downstairs bath pulling away from wall  Cannot currently make it up the stairs due to her back pain  Only one rail up the stairs  Tub/shower combination in bathroom  Low toilet in bathroom upstairs and no rails    Target Goal(s):   Safety and independence in bathrooms for toileting and showering      STRATEGIES    Saving Your Energy DO:  Use a tub bench/seat   Keep frequently used items within easy reach      Modifying your home environment and making it safe DO:  Tub to shower conversion in upstairs bathroom, with grab bars, handheld shower and lower holder; as well as a  higher toilet with toilet rails. Install higher toilet downstairs with grab bar and sturdy up sink so it does not pull away from the wall.  Place a rubber mat where you stand in the walk-in shower  Make sure the bathroom is well-lit  Have extra rail on other side of steps leading to 2nd level.      Simplifying the way you set up tasks or daily routines DO:  Plan to bathe/shower before you're overly tired  Gather everything you will need before beginning       PRACTICE   Based on what we have talked about, you are willing to try:   To keep going upstairs to shower as your back allows you and use the grab bars, handheld shower, and shower seat you already have to make showering safe.     If an idea does not work the first time, try it again (and again).               Ignacia Palma, OTR/L       07/14/2023     Occupational Therapist  Date     COMPLETED: Patient Stated       She would like to feel more safe in and out of her front and back doors. Front: ramp that leads up to flush with entry into house and then a side ramp to go onto the rest of her porch. Back: Look at [employer] for safety. 06/24/2022 Partially met--front entrance ramp completed. Back door--deck was removed so now there is a step out onto the concrete slab that client cannot safely step in and out of. This issue was relayed in an email to [facility].  07/14/2023  MET  Name: [name] [name]   Date: 07/14/2023   OT ACTION PLAN: Functional Mobility   Target Problem Area:   Safety in and out the front and back doors, up/down inside steps, ambulating in house and when out and about    Why Problem May Occur:      Steps  Rotting wood on back deck  Decreased mobility  Decreased endurance Pain    Target Goal(s):   Safety at both entrances, inside stairs and with ambulation      STRATEGIES   Saving Your Energy DO:  Use the rollator to go out the front door.  Don't go  up or down the steps if you are too tired Use the rollator to sit down on when you are tired     Modifying your home environment and making it safe     DO:  Install a ramp at front door so there are not steps  Install a grab bar at back door to help you step up and down  Make sure both areas are well-lit if going out at night Using both handrails up and down inside steps   PRACTICE  Based on what we have talked about, you are willing to try:   To use your new entrances using handrails for safety and using rollator on ramp, using your rollator in the house when you are not feeling steady on your feet or your energy is low. Using both handrails on inside steps.  If an idea does not work the first time, try it again (and again).   Ignacia Palma, OTR/L      07/14/2023  Occupational Therapist                        Date     COMPLETED: Patient Stated       Safety and ability to go up and down her inside steps. Grab bar on the right hand side as you go up (one top of the half wall that is 1/2 way up and then on the wall the rest of the way up). Pt asked about a chair lift--will put on request list but made her aware this more than likely will not be a possibility,  06/24/2022 MET  Name: [name] [name]   Date: 07/14/2023   OT ACTION PLAN: Functional Mobility   Target Problem Area:   Safety in and out the front and back doors, up/down inside steps, ambulating in house and when out and about    Why Problem May Occur:      Steps  Rotting wood on back deck  Decreased mobility  Decreased endurance Pain    Target Goal(s):   Safety at both entrances, inside stairs and with ambulation      STRATEGIES   Saving Your Energy DO:  Use the rollator to go out the front  door.  Don't go up or down the steps if you are too tired Use the rollator to sit down on when you are tired     Modifying your home environment and making it safe     DO:  Install a ramp at front door so there are not steps   Install a grab bar at back door to help you step up and down  Make sure both areas are well-lit if going out at night Using both handrails up and down inside steps   PRACTICE  Based on what we have talked about, you are willing to try:   To use your new entrances using handrails for safety and using rollator on ramp, using your rollator in the house when you are not feeling steady on your feet or your energy is low. Using both handrails on inside steps.  If an idea does not work the first time, try it again (and again).   Ignacia Palma, OTR/L      07/14/2023  Occupational Therapist                        Date     COMPLETED: Patient Stated       She would like to feel more safe getting around in her house and when she is out and about (will look into the possibility of a rollator). 12/12/2022 In process--a rollator will work for her--need to order her one.  07/14/2023  MET (actually met before this date)  Name: Alaya Iverson   Date: 07/14/2023   OT ACTION PLAN: Functional Mobility   Target Problem Area:   Safety in and out the front and back doors, up/down inside steps, ambulating in house and when out and about    Why Problem May Occur:      Steps  Rotting wood on back deck  Decreased mobility  Decreased endurance Pain    Target Goal(s):   Safety at both entrances, inside stairs and with ambulation      STRATEGIES   Saving Your Energy DO:  Use the rollator to go out the front door.  Don't go up or down the steps if you are too tired Use the rollator to sit down on when you are tired     Modifying your home environment and making it safe     DO:  Install a ramp at front door so there are not steps  Install a grab bar at back door to help you step up and down  Make sure both areas are well-lit if going out at night Using both handrails up and down inside steps   PRACTICE  Based on what we have talked about, you are willing to try:   To use your new entrances using  handrails for safety and using rollator on ramp, using your rollator in the house when you are not feeling steady on your feet or your energy is low. Using both handrails on inside steps.  If an idea does not work the first time, try it again (and again).   Ignacia Palma, OTR/L      07/14/2023  Occupational Therapist                        Date        Post Clinical Reasoning: Client Action (Goal) One Interventions: Both bathrooms have been made accesible (except for widening of one doorway--which I have sent a message  to [facility] about. Did Client Try?: Yes Targeted Problem Area Status: A Lot Better Client Action (Goal) Two Interventions: Ramp at front door and a grab bar at back door. Did Client Try?: Yes Targeted Problem Area Status: A Lot Better Client Action (Goal) Three Interventions: 2nd rail added to inside steps Did Client Try?: Yes Targeted Problem Area Status: A Lot Better Clinician View Of Client Situation:: Ms. Tabares is doing well with all of her home modifications and is enjoying having them. Client View Of His/Her Situation:: She states she feels so blessed by all that have worked with her during this program Next Visit Plan:: This was the last OT Aging Gracefully visit  Lindon Romp. OT Acute Rehabilitation Services Office [phone]

## 2023-07-16 ENCOUNTER — Ambulatory Visit: Admitting: Family Medicine

## 2023-07-16 ENCOUNTER — Encounter: Payer: Self-pay | Admitting: Family Medicine

## 2023-07-16 VITALS — BP 168/71 | HR 80 | Ht 63.0 in | Wt 186.4 lb

## 2023-07-16 DIAGNOSIS — R1084 Generalized abdominal pain: Secondary | ICD-10-CM

## 2023-07-16 DIAGNOSIS — R509 Fever, unspecified: Secondary | ICD-10-CM

## 2023-07-16 DIAGNOSIS — R059 Cough, unspecified: Secondary | ICD-10-CM

## 2023-07-16 DIAGNOSIS — J329 Chronic sinusitis, unspecified: Secondary | ICD-10-CM

## 2023-07-16 DIAGNOSIS — N2889 Other specified disorders of kidney and ureter: Secondary | ICD-10-CM

## 2023-07-16 DIAGNOSIS — J4489 Other specified chronic obstructive pulmonary disease: Secondary | ICD-10-CM

## 2023-07-16 MED ORDER — ONDANSETRON 4 MG PO TBDP: 4.0000 mg | ORAL_TABLET | Freq: Three times a day (TID) | ORAL | 0 refills | Status: DC | PRN

## 2023-07-16 NOTE — Assessment & Plan Note (Addendum)
 Chronic and stable. No signs of acute peritonitis on exam.  Was advised to follow-up with vascular surgery while she was in the hospital.  Referral authorized.  Provided phone number for this.

## 2023-07-16 NOTE — Patient Instructions (Addendum)
 [facility] [address] [phone]

## 2023-07-16 NOTE — Assessment & Plan Note (Signed)
 Possibly contributing to abd pain.  Recommend urology follow-up, referral pending.

## 2023-07-16 NOTE — Progress Notes (Cosign Needed Addendum)
    SUBJECTIVE:   CHIEF COMPLAINT / HPI:   Abdominal pain Found to have findings concerning for RCC on abdominal MRI recently after incidental renal mass noted on CT abd during hospital visit Was referred to urology as well as vascular surgery  Chronic sinusitis Has previously been treated for acute sinusitis Has had bad rhinorrhea refractory to Flonase, nasal saline Still has some sinus tenderness No fevers Denies seeing ENT in the past  PERTINENT  PMH / PSH: HTN, Diabetes, chronic sinusitis  OBJECTIVE:   BP (!) 168/71   Pulse 80   Ht 5\' 3"  (1.6 m)   Wt 186 lb 6 oz (84.5 kg)   SpO2 100%   BMI 33.01 kg/m   General: NAD, pleasant, able to participate in exam HEENT: Tender to palpation throughout frontal and maxillary sinuses bilaterally Respiratory: No respiratory distress Abd: Soft, nondistended, diffuse mild tenderness to palpation; no rebound/guarding Skin: warm and dry, no rashes noted Psych: Normal affect and mood  ASSESSMENT/PLAN:   Assessment & Plan Renal mass Possibly contributing to abd pain.  Recommend urology follow-up, referral pending. Generalized abdominal pain Chronic and stable. No signs of acute peritonitis on exam.  Was advised to follow-up with vascular surgery while she was in the hospital.  Referral authorized.  Provided phone number for this. Chronic sinusitis, unspecified location Referral to ENT given symptoms refractory to previous sinusitis treatment as well as nasal sprays  Elevated BP likely 2/2 abdominal pain, recheck at future visit.  Follow-up in 4 weeks Ordered new nebulizer machine DME as pt reports current machine not working  [name], MD [facility]

## 2023-07-16 NOTE — Assessment & Plan Note (Signed)
 Referral to ENT given symptoms refractory to previous sinusitis treatment as well as nasal sprays

## 2023-07-17 ENCOUNTER — Telehealth: Payer: Self-pay

## 2023-07-17 NOTE — Telephone Encounter (Signed)
 Patient calls nurse line in regards to Nebulizer.   She reports the pharmacy does not carry these.   DME order printed and left up front for pick up.   Patient will take to [facility].

## 2023-07-31 ENCOUNTER — Encounter (INDEPENDENT_AMBULATORY_CARE_PROVIDER_SITE_OTHER): Payer: Self-pay | Admitting: Otolaryngology

## 2023-08-03 ENCOUNTER — Telehealth: Payer: Self-pay

## 2023-08-03 ENCOUNTER — Ambulatory Visit: Payer: Medicare HMO

## 2023-08-03 ENCOUNTER — Other Ambulatory Visit: Payer: Self-pay | Admitting: Family Medicine

## 2023-08-03 VITALS — Ht 63.0 in | Wt 184.0 lb

## 2023-08-03 DIAGNOSIS — Z0000 Encounter for general adult medical examination without abnormal findings: Secondary | ICD-10-CM | POA: Diagnosis not present

## 2023-08-03 DIAGNOSIS — N3010 Interstitial cystitis (chronic) without hematuria: Secondary | ICD-10-CM | POA: Diagnosis not present

## 2023-08-03 DIAGNOSIS — R3915 Urgency of urination: Secondary | ICD-10-CM | POA: Diagnosis not present

## 2023-08-03 DIAGNOSIS — R102 Pelvic and perineal pain: Secondary | ICD-10-CM | POA: Diagnosis not present

## 2023-08-03 DIAGNOSIS — R1030 Lower abdominal pain, unspecified: Secondary | ICD-10-CM

## 2023-08-03 DIAGNOSIS — D49512 Neoplasm of unspecified behavior of left kidney: Secondary | ICD-10-CM | POA: Diagnosis not present

## 2023-08-03 NOTE — Telephone Encounter (Signed)
 Patient stated during her AWV that the referral to [facility] was never received at their office and they are requesting a new referral order.  Sharmaine Bain N. Glenna Lango, LPN [facility] Annual Wellness Team Direct Dial: [phone]
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
## 2023-08-03 NOTE — Patient Instructions (Addendum)
 [name] [name] , Thank you for taking time to come for your Medicare Wellness Visit. I appreciate your ongoing commitment to your health goals. Please review the following plan we discussed and let me know if I can assist you in the future.   Referrals/Orders/Follow-Ups/Clinician Recommendations: Keep maintaining your health by keeping your appointments with Dr. Baby Bolt and any specialists that you may see.  Call us  if you need anything.  Have a great year!!!!  This is a list of the screening recommended for you and due dates:  Health Maintenance  Topic Date Due   Zoster (Shingles) Vaccine (1 of 2) Never done   Eye exam for diabetics  02/13/2016   Complete foot exam   09/21/2019   COVID-19 Vaccine (5 - 2024-25 season) 12/21/2022   Hemoglobin A1C  09/04/2023   Flu Shot  11/20/2023   Yearly kidney health urinalysis for diabetes  01/05/2024   Yearly kidney function blood test for diabetes  06/08/2024   Medicare Annual Wellness Visit  08/02/2024   Mammogram  02/16/2025   Colon Cancer Screening  10/09/2027   DTaP/Tdap/Td vaccine (3 - Td or Tdap) 05/08/2029   Pneumonia Vaccine  Completed   DEXA scan (bone density measurement)  Completed   Hepatitis C Screening  Completed   HPV Vaccine  Aged Out   Meningitis B Vaccine  Aged Out    Advanced directives: (Declined) Advance directive discussed with you today. Even though you declined this today, please call our office should you change your mind, and we can give you the proper paperwork for you to fill out.  Next Medicare Annual Wellness Visit scheduled for next year: Yes

## 2023-08-05 ENCOUNTER — Other Ambulatory Visit: Payer: Self-pay | Admitting: Family Medicine

## 2023-08-05 DIAGNOSIS — E1142 Type 2 diabetes mellitus with diabetic polyneuropathy: Secondary | ICD-10-CM

## 2023-08-05 NOTE — Progress Notes (Signed)
 Reviewed and agree with Dr Macky Lower plan.

## 2023-08-12 ENCOUNTER — Telehealth: Payer: Self-pay | Admitting: *Deleted

## 2023-08-12 DIAGNOSIS — J4489 Other specified chronic obstructive pulmonary disease: Secondary | ICD-10-CM

## 2023-08-12 NOTE — Telephone Encounter (Signed)
 Pt called in checking on her referral to vein and vascular and she wants to know if dr would send her to a lung specialist for her breathing. Kreston Ahrendt Maynard Spears, CMA

## 2023-08-14 NOTE — Addendum Note (Signed)
 Addended byBaby Bolt, Ahni Bradwell on: 08/14/2023 10:40 AM   Modules accepted: Orders

## 2023-08-20 ENCOUNTER — Other Ambulatory Visit: Payer: Self-pay | Admitting: Family Medicine

## 2023-08-20 DIAGNOSIS — E1142 Type 2 diabetes mellitus with diabetic polyneuropathy: Secondary | ICD-10-CM

## 2023-08-27 ENCOUNTER — Other Ambulatory Visit: Payer: Self-pay

## 2023-08-27 NOTE — Patient Outreach (Signed)
 Aging Gracefully Program  08/27/2023  Joury Guider 1950-11-13 [id number]   [facility] Evaluation Interviewer attempted to call patient on today regarding Aging Gracefully referral. No answer from patient after multiple rings. CMA left confidential voicemail for patient to return call.  Will attempt to call back within 1 week.    [facility]  Population Health Care Management Assistant  Direct Dial: [phone]  Fax: [phone] Website: [email or web address]

## 2023-09-04 ENCOUNTER — Other Ambulatory Visit: Payer: Self-pay

## 2023-09-04 DIAGNOSIS — R059 Cough, unspecified: Secondary | ICD-10-CM

## 2023-09-04 MED ORDER — ONDANSETRON 4 MG PO TBDP: 4.0000 mg | ORAL_TABLET | Freq: Three times a day (TID) | ORAL | 0 refills | Status: DC | PRN

## 2023-09-11 ENCOUNTER — Telehealth: Payer: Self-pay | Admitting: Family Medicine

## 2023-09-11 NOTE — Telephone Encounter (Signed)
 Patient was identified as falling into the [place] Measure - Diabetes.   Patient was: Appointment scheduled with primary care provider in the next 30 days.

## 2023-09-21 ENCOUNTER — Encounter: Payer: Self-pay | Admitting: Family Medicine

## 2023-09-21 ENCOUNTER — Ambulatory Visit (INDEPENDENT_AMBULATORY_CARE_PROVIDER_SITE_OTHER): Admitting: Family Medicine

## 2023-09-21 VITALS — BP 169/88 | HR 76 | Ht 63.0 in | Wt 191.6 lb

## 2023-09-21 DIAGNOSIS — J4489 Other specified chronic obstructive pulmonary disease: Secondary | ICD-10-CM

## 2023-09-21 DIAGNOSIS — J449 Chronic obstructive pulmonary disease, unspecified: Secondary | ICD-10-CM

## 2023-09-21 DIAGNOSIS — R110 Nausea: Secondary | ICD-10-CM | POA: Diagnosis not present

## 2023-09-21 DIAGNOSIS — E1142 Type 2 diabetes mellitus with diabetic polyneuropathy: Secondary | ICD-10-CM

## 2023-09-21 DIAGNOSIS — I152 Hypertension secondary to endocrine disorders: Secondary | ICD-10-CM

## 2023-09-21 DIAGNOSIS — R053 Chronic cough: Secondary | ICD-10-CM | POA: Diagnosis not present

## 2023-09-21 DIAGNOSIS — E1159 Type 2 diabetes mellitus with other circulatory complications: Secondary | ICD-10-CM

## 2023-09-21 LAB — POCT GLYCOSYLATED HEMOGLOBIN (HGB A1C): HbA1c, POC (controlled diabetic range): 7.8 % — AB (ref 0.0–7.0)

## 2023-09-21 MED ORDER — BUDESONIDE-FORMOTEROL FUMARATE 80-4.5 MCG/ACT IN AERO: 2.0000 | INHALATION_SPRAY | Freq: Two times a day (BID) | RESPIRATORY_TRACT | 3 refills | Status: AC

## 2023-09-21 MED ORDER — ALBUTEROL SULFATE (2.5 MG/3ML) 0.083% IN NEBU: 2.5000 mg | INHALATION_SOLUTION | Freq: Four times a day (QID) | RESPIRATORY_TRACT | 1 refills | Status: AC | PRN

## 2023-09-21 MED ORDER — ONDANSETRON 4 MG PO TBDP: 4.0000 mg | ORAL_TABLET | Freq: Three times a day (TID) | ORAL | 0 refills | Status: DC | PRN

## 2023-09-21 NOTE — Assessment & Plan Note (Signed)
 BP elevated today likely due to medication nonadherence.  We discussed the patient's concerns with her medication regimen and decided to restart her losartan  at 25 mg daily.  She would prefer not to take amlodipine  at this time.  Current regimen would now be Coreg  25 mg twice daily and losartan  25 mg daily.  She is amenable to keep a blood pressure log at home daily for the next 2 to 3 weeks until we follow-up.  Obtain BMP at next visit since we are starting losartan .

## 2023-09-21 NOTE — Patient Instructions (Addendum)
 Please call to schedule an appointment with vascular surgery  [facility] [address] [phone]   Please keep track of your blood sugar and blood pressure every day for the next few weeks  Start taking losartan  25mg  ONCE daily  Take your Lantus  18 units ONCE daily

## 2023-09-21 NOTE — Assessment & Plan Note (Signed)
 A1c improved today, 7.8 from 8.3 Unclear exactly what patient's regimen is.  She reports she is taking Lantus  18 units twice daily and Ozempic  every other week.  She is amenable to taking her prescribed regimen of 18 units Lantus  daily and 0.5 mg Ozempic  weekly.  She is amenable to keep a daily log of her fasting blood sugars for the next 2 to 3 weeks until she follows up with me.  We discussed signs and symptoms of hypoglycemia/hyperglycemia.

## 2023-09-21 NOTE — Progress Notes (Signed)
    SUBJECTIVE:   CHIEF COMPLAINT / HPI:   T2DM -Current medication regimen: Ozempic  0.5 mg weekly (reports taking every other week), Lantus  18 units daily (reports taking twice daily) -Home CBGs: 160s-170s -Foot exam: normal today - denies wounds, has chronic neuropathy on gabapentin  -Eye exam: planning to schedule appt soon  Lab Results  Component Value Date   HGBA1C 7.8 (A) 09/21/2023   HGBA1C 8.3 (H) 06/07/2023   HGBA1C 8.4 (A) 06/02/2023    HTN Prescribed amlodipine  10mg  daily, coreg  25mg  BID, losartan  25mg  daily  Reports she only is taking coreg  because she was worried that amlodipine  and losartan  were making her dizzy Would prefer not to take amlodipine  right now   PERTINENT  PMH / PSH: T2DM, hypertension, chronic sinusitis  OBJECTIVE:   BP (!) 181/79   Pulse 76   Ht 5\' 3"  (1.6 m)   Wt 191 lb 9.6 oz (86.9 kg)   SpO2 100%   BMI 33.94 kg/m   General: NAD, pleasant, able to participate in exam Respiratory: No respiratory distress, good air movement b/l Skin: warm and dry, no rashes noted Feet: B/l feet without significant skin breakdown, ulcers, wounds. Sensation intact to sharp/dull sensation throughout. 2+ DP pulses b/l with normal cap refill in toes. Psych: Normal affect and mood  ASSESSMENT/PLAN:   Assessment & Plan DM type 2 with diabetic peripheral neuropathy (HCC) A1c improved today, 7.8 from 8.3 Unclear exactly what patient's regimen is.  She reports she is taking Lantus  18 units twice daily and Ozempic  every other week.  She is amenable to taking her prescribed regimen of 18 units Lantus  daily and 0.5 mg Ozempic  weekly.  She is amenable to keep a daily log of her fasting blood sugars for the next 2 to 3 weeks until she follows up with me.  We discussed signs and symptoms of hypoglycemia/hyperglycemia. Hypertension associated with diabetes (HCC) BP elevated today likely due to medication nonadherence.  We discussed the patient's concerns with her  medication regimen and decided to restart her losartan  at 25 mg daily.  She would prefer not to take amlodipine  at this time.  Current regimen would now be Coreg  25 mg twice daily and losartan  25 mg daily.  She is amenable to keep a blood pressure log at home daily for the next 2 to 3 weeks until we follow-up.  Obtain BMP at next visit since we are starting losartan .   Patient has follow-up scheduled with ENT on June 30 for her chronic sinusitis  I provided her the phone number for vascular surgery for her to schedule an appointment with them as well as follow-up was recommended after her hospital admission earlier this year  Edison Gore, MD [facility]

## 2023-10-06 ENCOUNTER — Telehealth: Payer: Self-pay

## 2023-10-06 ENCOUNTER — Other Ambulatory Visit: Payer: Self-pay

## 2023-10-06 DIAGNOSIS — E1159 Type 2 diabetes mellitus with other circulatory complications: Secondary | ICD-10-CM

## 2023-10-06 NOTE — Patient Outreach (Signed)
 Aging Gracefully Program  10/06/2023  [name] [name] July 08, 1950 [id number]   [facility] Evaluation Interviewer made contact with patient. Aging Gracefully survey completed.     [facility]  Population Health Care Management Assistant  Direct Dial: [phone]  Fax: [phone] Website: [email or web address]

## 2023-10-13 ENCOUNTER — Telehealth: Payer: Self-pay

## 2023-10-13 NOTE — Progress Notes (Unsigned)
 Complex Care Management Note Care Guide Note  10/13/2023 Name: [name] [name] MRN: [id number] DOB: 1950-08-06   Complex Care Management Outreach Attempts: An unsuccessful telephone outreach was attempted today to offer the patient information about available complex care management services.  Follow Up Plan:  Additional outreach attempts will be made to offer the patient complex care management information and services.   Encounter Outcome:  No Answer  Leotis Rase [facility], [facility] Guide  Direct Dial: [phone]  Fax [phone]

## 2023-10-13 NOTE — Progress Notes (Unsigned)
 Complex Care Management Note  Care Guide Note 10/13/2023 Name: [name] [name] MRN: [id number] DOB: [birth year]  [name] [name] is a 73 y.o. year old female who sees Romelle Booty, MD for primary care. I reached out to [employer] by phone today to offer complex care management services.  [name] [name] was given information about Complex Care Management services today including:   The Complex Care Management services include support from the care team which includes your Nurse Care Manager, Clinical Social Worker, or Pharmacist.  The Complex Care Management team is here to help remove barriers to the health concerns and goals most important to you. Complex Care Management services are voluntary, and the patient may decline or stop services at any time by request to their care team member.   Complex Care Management Consent Status: Patient agreed to services and verbal consent obtained.   Follow up plan:  Telephone appointment with complex care management team member scheduled for:  10-27-23  Encounter Outcome:  Patient Scheduled   Leotis Rase [facility], [facility] Guide  Direct Dial: [phone]  Fax [phone]

## 2023-10-17 ENCOUNTER — Other Ambulatory Visit: Payer: Self-pay | Admitting: Family Medicine

## 2023-10-17 DIAGNOSIS — E1142 Type 2 diabetes mellitus with diabetic polyneuropathy: Secondary | ICD-10-CM

## 2023-10-19 ENCOUNTER — Ambulatory Visit (INDEPENDENT_AMBULATORY_CARE_PROVIDER_SITE_OTHER): Admitting: Audiology

## 2023-10-19 ENCOUNTER — Institutional Professional Consult (permissible substitution) (INDEPENDENT_AMBULATORY_CARE_PROVIDER_SITE_OTHER): Admitting: Otolaryngology

## 2023-10-22 ENCOUNTER — Other Ambulatory Visit: Payer: Self-pay | Admitting: Family Medicine

## 2023-10-22 DIAGNOSIS — E1142 Type 2 diabetes mellitus with diabetic polyneuropathy: Secondary | ICD-10-CM

## 2023-10-27 ENCOUNTER — Other Ambulatory Visit: Payer: Self-pay

## 2023-10-27 NOTE — Patient Instructions (Signed)
 Visit Information  Thank you for taking time to visit with me today. Please don't hesitate to contact me if I can be of assistance to you before our next scheduled appointment.  Our next appointment is by telephone on 11/10/2023 at 1:15PM Please call the care guide team at [phone] if you need to cancel or reschedule your appointment.   Following is a copy of your care plan:   Goals Addressed             This Visit's Progress    BSW VBCI Social Work Care Plan       Problems:   Food Insecurity  and utility assistance among need for home aid services and PT options.  CSW Clinical Goal(s):   Over the next 2 weeks the Patient will work with Child psychotherapist to address concerns related to food insecurity, utility assistance, need for home aid services and PT options..  Interventions:  BSW will provide patient resources for SDOH needs and attempt to facilitate connection for home aid services/PT.    Patient Goals/Self-Care Activities:  None  Plan:   Telephone follow up appointment with care management team member scheduled for:  11/10/2023 at 1:15pm        Please call the Suicide and Crisis Lifeline: 988 go to [facility]gent [facility] [address] [place] [phone]) call 911 if you are experiencing a Mental Health or Behavioral Health Crisis or need someone to talk to.  The patient verbalized understanding of instructions, educational materials, and care plan provided today and DECLINED offer to receive copy of patient instructions, educational materials, and care plan.   Laymon Doll, BSW [place]/VBCI - [employer] Social Worker [phone]
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
## 2023-10-30 ENCOUNTER — Other Ambulatory Visit: Payer: Self-pay | Admitting: Physician Assistant

## 2023-10-30 DIAGNOSIS — K219 Gastro-esophageal reflux disease without esophagitis: Secondary | ICD-10-CM

## 2023-10-30 DIAGNOSIS — K209 Esophagitis, unspecified without bleeding: Secondary | ICD-10-CM

## 2023-11-05 ENCOUNTER — Ambulatory Visit: Admitting: Pulmonary Disease

## 2023-11-10 ENCOUNTER — Other Ambulatory Visit: Payer: Self-pay

## 2023-11-10 ENCOUNTER — Encounter: Payer: Self-pay | Admitting: Family Medicine

## 2023-11-10 DIAGNOSIS — J4489 Other specified chronic obstructive pulmonary disease: Secondary | ICD-10-CM

## 2023-11-10 DIAGNOSIS — E1142 Type 2 diabetes mellitus with diabetic polyneuropathy: Secondary | ICD-10-CM
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
## 2023-11-10 NOTE — Patient Instructions (Signed)
 Visit Information  Thank you for taking time to visit with me today. Please don't hesitate to contact me if I can be of assistance to you before our next scheduled appointment.  Your next care management appointment is by telephone on 11/25/2023 at 1:130PM   Please call the care guide team at [phone] if you need to cancel, schedule, or reschedule an appointment.   Please call the Suicide and Crisis Lifeline: 988 go to [facility]gent [facility] [address] [place] [phone]) call 911 if you are experiencing a Mental Health or Behavioral Health Crisis or need someone to talk to.  Laymon Doll, BSW [place]/VBCI - [employer] Social Worker [phone]

## 2023-11-10 NOTE — Patient Outreach (Signed)
 BSW contacted patient PCP Dr. Payton Coward informing him that he spoke with [facility] regarding patient request for physical therapy. PCP confirmed he will complete pre-authorization on his end for physical therapy.   BSW called patient to provide update and spoke with patient granddaughter. Granddaughter agreed to provide update to her grandmother.  Laymon Doll, BSW [place]/VBCI - [employer] Social Worker [phone]

## 2023-11-25 ENCOUNTER — Telehealth: Payer: Self-pay

## 2023-11-25 ENCOUNTER — Other Ambulatory Visit: Payer: Self-pay | Admitting: Family Medicine

## 2023-11-25 DIAGNOSIS — J4489 Other specified chronic obstructive pulmonary disease: Secondary | ICD-10-CM

## 2023-11-25 DIAGNOSIS — M5417 Radiculopathy, lumbosacral region: Secondary | ICD-10-CM

## 2023-11-26 DIAGNOSIS — D49512 Neoplasm of unspecified behavior of left kidney: Secondary | ICD-10-CM | POA: Diagnosis not present

## 2023-11-27 ENCOUNTER — Ambulatory Visit: Admitting: Family Medicine

## 2023-11-27 ENCOUNTER — Other Ambulatory Visit: Payer: Self-pay | Admitting: Urology

## 2023-11-27 DIAGNOSIS — N2889 Other specified disorders of kidney and ureter: Secondary | ICD-10-CM

## 2023-11-27 NOTE — Progress Notes (Deleted)
    SUBJECTIVE:   CHIEF COMPLAINT / HPI:   HTN Current medication regimen includes amlodipine  10 mg daily, Coreg  25 mg twice daily***, losartan  25 mg daily ***  She saw urology today and they are planning to do an IR guided procedure for her renal mass  PERTINENT  PMH / PSH: ***  OBJECTIVE:   There were no vitals taken for this visit.  ***  ASSESSMENT/PLAN:   Assessment & Plan    Payton Coward, MD [facility]

## 2023-12-02 ENCOUNTER — Telehealth: Payer: Self-pay | Admitting: Pharmacist

## 2023-12-02 NOTE — Telephone Encounter (Signed)
 Patient contacted to inquire about Libre CGM use.  Patient unavailable, left VM and provided return phone number.  Total time with patient call and documentation of interaction: 3 minutes.

## 2023-12-07 DIAGNOSIS — M48061 Spinal stenosis, lumbar region without neurogenic claudication: Secondary | ICD-10-CM | POA: Diagnosis not present

## 2023-12-07 DIAGNOSIS — G894 Chronic pain syndrome: Secondary | ICD-10-CM | POA: Diagnosis not present

## 2023-12-07 DIAGNOSIS — J329 Chronic sinusitis, unspecified: Secondary | ICD-10-CM | POA: Diagnosis not present

## 2023-12-07 DIAGNOSIS — J4489 Other specified chronic obstructive pulmonary disease: Secondary | ICD-10-CM | POA: Diagnosis not present

## 2023-12-07 DIAGNOSIS — E1142 Type 2 diabetes mellitus with diabetic polyneuropathy: Secondary | ICD-10-CM | POA: Diagnosis not present

## 2023-12-07 DIAGNOSIS — I2510 Atherosclerotic heart disease of native coronary artery without angina pectoris: Secondary | ICD-10-CM | POA: Diagnosis not present

## 2023-12-07 DIAGNOSIS — M5416 Radiculopathy, lumbar region: Secondary | ICD-10-CM | POA: Diagnosis not present

## 2023-12-07 DIAGNOSIS — E1159 Type 2 diabetes mellitus with other circulatory complications: Secondary | ICD-10-CM | POA: Diagnosis not present

## 2023-12-07 DIAGNOSIS — I152 Hypertension secondary to endocrine disorders: Secondary | ICD-10-CM | POA: Diagnosis not present

## 2023-12-09 ENCOUNTER — Other Ambulatory Visit

## 2023-12-09 ENCOUNTER — Telehealth: Payer: Self-pay

## 2023-12-09 ENCOUNTER — Ambulatory Visit
Admission: RE | Admit: 2023-12-09 | Discharge: 2023-12-09 | Disposition: A | Discharge status: Home / Self Care | Source: Ambulatory Visit | Attending: Urology | Admitting: Urology

## 2023-12-09 DIAGNOSIS — N2889 Other specified disorders of kidney and ureter: Secondary | ICD-10-CM

## 2023-12-09 HISTORY — PX: IR RADIOLOGIST EVAL & MGMT: IMG5224
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
## 2023-12-14 ENCOUNTER — Other Ambulatory Visit: Payer: Self-pay

## 2023-12-14 DIAGNOSIS — M48061 Spinal stenosis, lumbar region without neurogenic claudication: Secondary | ICD-10-CM | POA: Diagnosis not present

## 2023-12-14 DIAGNOSIS — E1142 Type 2 diabetes mellitus with diabetic polyneuropathy: Secondary | ICD-10-CM | POA: Diagnosis not present

## 2023-12-14 DIAGNOSIS — J4489 Other specified chronic obstructive pulmonary disease: Secondary | ICD-10-CM | POA: Diagnosis not present

## 2023-12-14 DIAGNOSIS — I2510 Atherosclerotic heart disease of native coronary artery without angina pectoris: Secondary | ICD-10-CM | POA: Diagnosis not present

## 2023-12-14 DIAGNOSIS — I152 Hypertension secondary to endocrine disorders: Secondary | ICD-10-CM | POA: Diagnosis not present

## 2023-12-14 DIAGNOSIS — M5416 Radiculopathy, lumbar region: Secondary | ICD-10-CM | POA: Diagnosis not present

## 2023-12-14 DIAGNOSIS — E1159 Type 2 diabetes mellitus with other circulatory complications: Secondary | ICD-10-CM | POA: Diagnosis not present

## 2023-12-14 DIAGNOSIS — J329 Chronic sinusitis, unspecified: Secondary | ICD-10-CM | POA: Diagnosis not present

## 2023-12-14 DIAGNOSIS — G894 Chronic pain syndrome: Secondary | ICD-10-CM | POA: Diagnosis not present
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
## 2023-12-14 NOTE — Patient Instructions (Signed)
 Visit Information  Thank you for taking time to visit with me today. Please don't hesitate to contact me if I can be of assistance to you before our next scheduled appointment.  Your next care management appointment is by telephone on 12/23/2023 at 11:30AM   Please call the care guide team at [phone] if you need to cancel, schedule, or reschedule an appointment.   Please call the Suicide and Crisis Lifeline: 988 go to [facility] [address] [place] [phone]) call 911 if you are experiencing a Mental Health or Behavioral Health Crisis or need someone to talk to.  Laymon Doll, BSW [place]/VBCI - [employer] Social Worker [phone]

## 2023-12-16 ENCOUNTER — Telehealth (INDEPENDENT_AMBULATORY_CARE_PROVIDER_SITE_OTHER): Payer: Self-pay

## 2023-12-16 ENCOUNTER — Other Ambulatory Visit (HOSPITAL_COMMUNITY): Payer: Self-pay

## 2023-12-16 ENCOUNTER — Other Ambulatory Visit

## 2023-12-16 DIAGNOSIS — N2889 Other specified disorders of kidney and ureter: Secondary | ICD-10-CM

## 2023-12-16 NOTE — Telephone Encounter (Signed)
 LVM to reschedule [phone] afm

## 2023-12-17 ENCOUNTER — Other Ambulatory Visit: Payer: Self-pay

## 2023-12-17 DIAGNOSIS — N2889 Other specified disorders of kidney and ureter: Secondary | ICD-10-CM

## 2023-12-18 ENCOUNTER — Ambulatory Visit: Admitting: Family Medicine

## 2023-12-18 ENCOUNTER — Encounter: Payer: Self-pay | Admitting: Family Medicine

## 2023-12-18 ENCOUNTER — Other Ambulatory Visit (HOSPITAL_COMMUNITY): Payer: Self-pay

## 2023-12-18 VITALS — BP 173/79 | HR 72 | Temp 98.6°F | Ht 63.0 in | Wt 194.2 lb

## 2023-12-18 DIAGNOSIS — E1142 Type 2 diabetes mellitus with diabetic polyneuropathy: Secondary | ICD-10-CM | POA: Diagnosis not present

## 2023-12-18 DIAGNOSIS — E1159 Type 2 diabetes mellitus with other circulatory complications: Secondary | ICD-10-CM | POA: Diagnosis not present

## 2023-12-18 DIAGNOSIS — E785 Hyperlipidemia, unspecified: Secondary | ICD-10-CM

## 2023-12-18 DIAGNOSIS — I152 Hypertension secondary to endocrine disorders: Secondary | ICD-10-CM

## 2023-12-18 DIAGNOSIS — J029 Acute pharyngitis, unspecified: Secondary | ICD-10-CM | POA: Diagnosis not present

## 2023-12-18 DIAGNOSIS — E1169 Type 2 diabetes mellitus with other specified complication: Secondary | ICD-10-CM

## 2023-12-18 LAB — POCT GLYCOSYLATED HEMOGLOBIN (HGB A1C): HbA1c, POC (controlled diabetic range): 8 % — AB (ref 0.0–7.0)

## 2023-12-18 LAB — POC SOFIA SARS ANTIGEN FIA: SARS Coronavirus 2 Ag: NEGATIVE

## 2023-12-18 MED ORDER — FREESTYLE LIBRE 3 SENSOR MISC
11 refills | Status: DC
  Filled 2023-12-18: qty 2, 28d supply, fill #0

## 2023-12-18 MED ORDER — SEMAGLUTIDE (1 MG/DOSE) 4 MG/3ML ~~LOC~~ SOPN: 1.0000 mg | PEN_INJECTOR | SUBCUTANEOUS | 1 refills | Status: AC

## 2023-12-18 MED ORDER — GABAPENTIN 300 MG PO CAPS: 300.0000 mg | ORAL_CAPSULE | Freq: Three times a day (TID) | ORAL | 2 refills | Status: DC

## 2023-12-18 MED ORDER — LOSARTAN POTASSIUM 50 MG PO TABS
50.0000 mg | ORAL_TABLET | Freq: Every day | ORAL | 3 refills | Status: AC
Start: 2023-12-18 — End: ?

## 2023-12-18 NOTE — Progress Notes (Signed)
    SUBJECTIVE:   CHIEF COMPLAINT / HPI:   T2DM -Current medication regimen: Lantus  18 units daily, Ozempic  0.5 mg weekly -Home CBGs: 80% in range on Libre, 20% above, avg 154 -Foot exam: UTD -Eye exam: due  Lab Results  Component Value Date   HGBA1C 8.0 (A) 12/18/2023   HGBA1C 7.8 (A) 09/21/2023   HGBA1C 8.3 (H) 06/07/2023    HTN Currently on losartan  25 mg daily, Coreg  25 mg daily Not taking amlodipine  Did not take meds today yet Reports last checked BP on Monday and it was 130s-140s systolic Denies recent lightheadedness, dizziness, LOC, severe headache or vision changes   Nasal congestion Chronic Wants covid test today just in case Seeing ENT 9/3  Renal mass Getting IR procedure in September   PERTINENT  PMH / PSH: T2DM, hypertension, chronic sinusitis, renal mass  OBJECTIVE:   BP (!) 178/77   Pulse 72   Temp 98.6 F (37 C)   Ht 5' 3 (1.6 m)   Wt 194 lb 3.2 oz (88.1 kg)   SpO2 98%   BMI 34.40 kg/m    General: NAD, pleasant, able to participate in exam Respiratory: No respiratory distress Skin: warm and dry, no rashes noted Psych: Normal affect and mood  ASSESSMENT/PLAN:   Assessment & Plan DM type 2 with diabetic peripheral neuropathy (HCC) A1c 8 from 7.8 CBG mostly in range, no lows, having some highs Increase ozempic  to 1mg  weekly, continue lantus  18u daily F/u 1-2 mo Hypertension associated with diabetes (HCC) Increase losartan  to 50mg  daily BMP today since at last visit losartan  was restarted F/u 2 weeks, before her IR procedure, recommend home bp cuff Sore throat Covid neg F/u scheduled with ENT   Payton Coward, MD [facility]

## 2023-12-18 NOTE — Assessment & Plan Note (Addendum)
 Increase losartan  to 50mg  daily BMP today since at last visit losartan  was restarted F/u 2 weeks, before her IR procedure, recommend home bp cuff

## 2023-12-18 NOTE — Patient Instructions (Addendum)
 [employer] blood pressure cuff recommended  Continue taking 18 units of Lantus  every day  I have increased your dose of Ozempic  to 1 mg weekly  I have increased your dose of losartan  to 50 mg daily If you experience any lightheadedness, dizziness, or falls while taking this increased dose then you can go back to taking 25 mg daily  I have sent a refill of your gabapentin  to your pharmacy and also sent your glucose sensor to [facility] pharmacy  If any of your results from today are abnormal and/or require changes to your medical care, I will give you a call. Otherwise, I will send you a letter in the mail or a message on MyChart.

## 2023-12-18 NOTE — Assessment & Plan Note (Signed)
 A1c 8 from 7.8 CBG mostly in range, no lows, having some highs Increase ozempic  to 1mg  weekly, continue lantus  18u daily F/u 1-2 mo

## 2023-12-19 LAB — BASIC METABOLIC PANEL WITH GFR
BUN/Creatinine Ratio: 8 — ABNORMAL LOW (ref 12–28)
BUN: 7 mg/dL — ABNORMAL LOW (ref 8–27)
CO2: 26 mmol/L (ref 20–29)
Calcium: 9.1 mg/dL (ref 8.7–10.3)
Chloride: 103 mmol/L (ref 96–106)
Creatinine, Ser: 0.83 mg/dL (ref 0.57–1.00)
Glucose: 118 mg/dL — ABNORMAL HIGH (ref 70–99)
Potassium: 4.3 mmol/L (ref 3.5–5.2)
Sodium: 143 mmol/L (ref 134–144)
eGFR: 73 mL/min/1.73 (ref >59)

## 2023-12-19 LAB — MICROALBUMIN / CREATININE URINE RATIO
Creatinine, Urine: 66.7 mg/dL
Microalb/Creat Ratio: 30 mg/g{creat} — ABNORMAL HIGH (ref 0–29)
Microalbumin, Urine: 20.1 ug/mL

## 2023-12-21 ENCOUNTER — Telehealth: Payer: Self-pay | Admitting: Family Medicine

## 2023-12-21 DIAGNOSIS — G894 Chronic pain syndrome: Secondary | ICD-10-CM | POA: Diagnosis not present

## 2023-12-21 DIAGNOSIS — J4489 Other specified chronic obstructive pulmonary disease: Secondary | ICD-10-CM | POA: Diagnosis not present

## 2023-12-21 DIAGNOSIS — M48061 Spinal stenosis, lumbar region without neurogenic claudication: Secondary | ICD-10-CM | POA: Diagnosis not present

## 2023-12-21 DIAGNOSIS — I152 Hypertension secondary to endocrine disorders: Secondary | ICD-10-CM | POA: Diagnosis not present

## 2023-12-21 DIAGNOSIS — E1142 Type 2 diabetes mellitus with diabetic polyneuropathy: Secondary | ICD-10-CM | POA: Diagnosis not present

## 2023-12-21 DIAGNOSIS — E1159 Type 2 diabetes mellitus with other circulatory complications: Secondary | ICD-10-CM | POA: Diagnosis not present

## 2023-12-21 DIAGNOSIS — M5416 Radiculopathy, lumbar region: Secondary | ICD-10-CM | POA: Diagnosis not present

## 2023-12-21 DIAGNOSIS — I2510 Atherosclerotic heart disease of native coronary artery without angina pectoris: Secondary | ICD-10-CM | POA: Diagnosis not present

## 2023-12-21 DIAGNOSIS — J329 Chronic sinusitis, unspecified: Secondary | ICD-10-CM | POA: Diagnosis not present

## 2023-12-21 NOTE — Telephone Encounter (Signed)
 After-hours call  Patient's physical therapy assistant Katheryn from [facility] home health called emergency line to express concerns.  Patient's blood pressure when PTA checked it was 208/104.  Patient had not taken any blood pressure medicines this morning.  She was told to increase to losartan  50 mg daily; however, patient was not able to pick up the 50 mg tablets from the pharmacy due to them being out of stock, and she was still taking the losartan  25 mg daily.  I instructed patient to take 2 of her losartan  25 mg tablets to equal a 50 mg dose daily until she could pick up the 50 mg tablets.  Patient denied chest pain, shortness of breath, vision changes.  She did endorse headaches that she has always per the PTA.  PTA states that patient adamantly declined EMS evaluation.  In addition, PT mentions the patient does not know how to use her libre 3 sensor.  She is unsure of how to actually check her sugars at home.  Patient's granddaughter states sugar taken by PTA was 208.  She is about to take her Lantus  18 units daily, however.  Reassured by recent A1c of 8.0 and BMP with reassuring glucose at her last office visit on 12/18/2023.  Continue Lantus  18 units and semaglutide  1 mg weekly for now.  I spoke with patient and her granddaughter over the phone about these concerns.  She has now taken 50 mg of her losartan .  She is about to take her insulin  now.  I advised follow-up in the office tomorrow for further blood pressure evaluation, diabetes education, and likely need for care management referral for home health RN.  Patient is scheduled with access to care at 2:50 PM on 12/22/2023.  Advised to present to the ED if having worsening symptoms, chest pain, shortness of breath, vision changes before appointment tomorrow.

## 2023-12-22 ENCOUNTER — Ambulatory Visit: Payer: Self-pay | Admitting: Family Medicine

## 2023-12-22 ENCOUNTER — Ambulatory Visit (INDEPENDENT_AMBULATORY_CARE_PROVIDER_SITE_OTHER): Payer: Self-pay | Admitting: Student

## 2023-12-22 VITALS — BP 176/84 | HR 89 | Ht 63.0 in | Wt 198.4 lb

## 2023-12-22 DIAGNOSIS — E1142 Type 2 diabetes mellitus with diabetic polyneuropathy: Secondary | ICD-10-CM

## 2023-12-22 DIAGNOSIS — E1159 Type 2 diabetes mellitus with other circulatory complications: Secondary | ICD-10-CM

## 2023-12-22 DIAGNOSIS — I152 Hypertension secondary to endocrine disorders: Secondary | ICD-10-CM

## 2023-12-22 DIAGNOSIS — Z23 Encounter for immunization: Secondary | ICD-10-CM

## 2023-12-22 LAB — POCT GLUCOSE FINGERSTICK: Glucose: 179 — AB (ref 70–99)

## 2023-12-22 MED ORDER — AMLODIPINE BESYLATE 5 MG PO TABS: 5.0000 mg | ORAL_TABLET | Freq: Every day | ORAL | 0 refills | Status: DC

## 2023-12-22 NOTE — Assessment & Plan Note (Addendum)
 Above goal today. SBP 190s on recheck in 170s. Prescribe 5 mg amlodipine . Continue Losartan  50 mg, combined 25 mg BID pills to 50 mg daily  Patient will need follow up in 2 days to check BP

## 2023-12-22 NOTE — Progress Notes (Unsigned)
    SUBJECTIVE:   CHIEF COMPLAINT / HPI:   [name] [name] is a 73 y.o. female presenting for blood pressure follow up.   HTN: elevated readings at home and at PT. Denies having neurological changes including vision loss, headaches and weakness.   PERTINENT  PMH / PSH: reviewed and updated.  OBJECTIVE:   BP (!) 176/84   Pulse 89   Ht 5' 3 (1.6 m)   Wt 198 lb 6.4 oz (90 kg)   SpO2 98%   BMI 35.14 kg/m   Chronically ill-appearing, no acute distress, ambulates with walker.  Cardio: Regular rate, regular rhythm, no murmurs on exam. Pulm: Clear, no wheezing, no crackles. No increased work of breathing Abdominal: bowel sounds present, soft, non-tender, non-distended Extremities: no peripheral edema  Neuro: alert and oriented x3, speech normal in content, no facial asymmetry, strength intact and equal bilaterally in UE and LE, pupils equal and reactive to light.  Psych:  Cognition and judgment appear intact. Alert, communicative  and cooperative with normal attention span and concentration. No apparent delusions, illusions, hallucinations    ASSESSMENT/PLAN:   Assessment & Plan DM type 2 with diabetic peripheral neuropathy (HCC) Checked POC glucose today d/t Libre not working at home. Random glucose resulted at 172 which is reassuring.  Dr. Koval was able to assist with reconnecting Libre sensor to the patient's phone.  Continue Lantus  18 units daily  Hypertension associated with diabetes (HCC) Above goal today. SBP 190s on recheck in 170s. Prescribe 5 mg amlodipine . Continue Losartan  50 mg, combined 25 mg BID pills to 50 mg daily  Patient will need follow up in 2 days to check BP  Encounter for immunization      Damien Pinal, DO [facility]

## 2023-12-22 NOTE — Progress Notes (Signed)
 Asked by Dr. Cleotilde to evaluate for CGM use issue.  Patient reports she was unable to get CGM working and her granddaughter was also unable to get her recent sensor working (granddaughter also wears Libre 3 CGM).    Patient has no additional sensors.  Patient provided with 1 sample sensor.   Asked patient to reschedule with me (bring her reader) this week, if she cannot get the new sensor provided to link to her reader.   IF, she is able to get her reader to sense readings, then she will follow-up next week.

## 2023-12-22 NOTE — Patient Instructions (Signed)
 It was great to see you today!  I have prescribed an additional blood pressure medication called amlodipine  5 mg. This is a once per day medication.  Please come back in 2 days to have your blood pressure rechecked and to follow up on your symptoms.   Future Appointments  Date Time Provider Department Center  12/23/2023 11:30 AM [place], [place] [facility] None  12/29/2023  9:30 AM Romelle Booty, MD [facility]  12/30/2023  1:00 PM [facility] PAT 2 [facility] None  01/01/2024  8:30 AM Neda Jennet LABOR, MD [facility] [address]  01/06/2024  8:30 AM WL-CT 1 WL-CT [place]  01/21/2024  9:30 AM Anice Riis, DO [facility] None  08/04/2024  4:10 PM [facility] ANNUAL WELLNESS VISIT [facility]    Please arrive 15 minutes before your appointment to ensure smooth check in process.    Please call the clinic at [phone] if your symptoms worsen or you have any concerns.  Thank you for allowing me to participate in your care, Dr. Damien Pinal [facility]

## 2023-12-23 ENCOUNTER — Institutional Professional Consult (permissible substitution) (INDEPENDENT_AMBULATORY_CARE_PROVIDER_SITE_OTHER)

## 2023-12-23 ENCOUNTER — Telehealth: Payer: Self-pay

## 2023-12-23 NOTE — Assessment & Plan Note (Addendum)
 Checked POC glucose today d/t Libre not working at home. Random glucose resulted at 172 which is reassuring.  Dr. Koval was able to assist with reconnecting Libre sensor to the patient's phone.  Continue Lantus  18 units daily

## 2023-12-24 ENCOUNTER — Encounter: Payer: Self-pay | Admitting: Pharmacist

## 2023-12-24 ENCOUNTER — Ambulatory Visit (INDEPENDENT_AMBULATORY_CARE_PROVIDER_SITE_OTHER): Admitting: Pharmacist

## 2023-12-24 VITALS — BP 136/65 | HR 72 | Wt 193.0 lb

## 2023-12-24 DIAGNOSIS — E1142 Type 2 diabetes mellitus with diabetic polyneuropathy: Secondary | ICD-10-CM

## 2023-12-24 DIAGNOSIS — E1159 Type 2 diabetes mellitus with other circulatory complications: Secondary | ICD-10-CM

## 2023-12-24 DIAGNOSIS — I152 Hypertension secondary to endocrine disorders: Secondary | ICD-10-CM | POA: Diagnosis not present

## 2023-12-24 DIAGNOSIS — J4489 Other specified chronic obstructive pulmonary disease: Secondary | ICD-10-CM | POA: Diagnosis not present

## 2023-12-24 MED ORDER — TRUEPLUS LANCETS 33G MISC: 3 refills | Status: AC

## 2023-12-24 NOTE — Assessment & Plan Note (Signed)
 Diabetes longstanding currently uncontrolled. Patient is able to verbalize appropriate hypoglycemia management plan. Medication adherence appears good. Control is suboptimal due to dietary indiscretion, lack of ability to test sugars due to broken CGM reader and no lancets . -Continued Lantus  (insulin  glargine) 18 units daily -Continued GLP-1 Ozempic  (semaglutide ) 1.5 mg weekly on Saturdays  -Supplied with new Freestyle Libre 3 reader and placed new Freestyle Libre 3 plus sensor -Patient educated on purpose, proper use, and potential adverse effects.  -Extensively discussed pathophysiology of diabetes, recommended lifestyle interventions, dietary effects on blood sugar control.  -Counseled on s/sx of and management of hypoglycemia.

## 2023-12-24 NOTE — Assessment & Plan Note (Addendum)
 Chronic Bronchitis with COPD - Patient educated on purpose, proper use and potential adverse effects of Symbicort  (formoterol  / budesonide ).   Following instruction patient verbalized understanding of treatment plan.  Patient was able to demonstrate appropriate technique while administering dose in office.  Instructed to use Symbicort  (formoterol  / budesonide ) as prescribed in hopes to help breathing and decrease frequency of albuterol .

## 2023-12-24 NOTE — Assessment & Plan Note (Signed)
 Hypertension longstanding currently improving with in office pressure of 136/65. Blood pressure goal of <130/80 mmHg. Medication adherence good. Told to bring in home logs and meter to next appointment to evaluate if decrease in dose of amlodipine  is necessary -Continued Amlodipine  5 mg daily -Continued Carvedilol  25 mg twice daily with meals -Continued Losartan  50 mg daily

## 2023-12-24 NOTE — Progress Notes (Signed)
 S:     Chief Complaint  Patient presents with  . Medication Management    CGM replacement/HTN   73 y.o. female who presents for diabetes evaluation, education, and management. Patient arrives in decent spirits and presents with assistance of a rolling walker.   Patient was referred and last seen by Primary Care Provider, Dr. CHARLENA Pinal, on 12/22/23.  At last visit with Dr. Pinal, patient reports CGM reader was no longer working and need help with a replacement.   PMH is significant for Diabetes, hypertension, COPD.   Current diabetes medications include: Lantus  (insulin  glargine) 18 units daily, Ozempic  (semaglutide ) 1.5 mg weekly  Current hypertension medications include: Losartan  25 mg 2 tablets daily until she can pick up losartan  50 mg from pharmacy. Amlodipine  5 mg daily, carvedilol  25 mg twice daily  Patient reports adherence to taking all medications as prescribed.  Reports frequent use of albuterol  inhaler (at least 2x daily with an additional dose at night if needed) as well as using Symbicort  (formoterol  / budesonide ) only as needed  Do you feel that your medications are working for you? yes Have you been experiencing any side effects to the medications prescribed? Dizziness when taking amlodipine  after taking losartan  and carvedilol  (likely due to relative hypotension coming down from reported home blood pressure of 200 at home previously). Slight nausea on Saturdays after dose of Ozempic  (semaglutide ) but gets better immediately when using Zofran .  Reports burning in right side of nose, fluticasone  helps but when it wears off burning returns. Wakes her up at night. Feels like she has an infection in her nose, deferred to Dr. Romelle for follow up.  Patient denies hypoglycemic events.   Patient reports neuropathy (nerve pain)  O:   Review of Systems  Musculoskeletal:  Positive for back pain and joint pain.  All other systems reviewed and are negative.   Physical  Exam Constitutional:      Appearance: Normal appearance.  Pulmonary:     Effort: Pulmonary effort is normal.  Neurological:     Mental Status: She is alert.  Psychiatric:        Mood and Affect: Mood normal.        Behavior: Behavior normal.        Thought Content: Thought content normal.        Judgment: Judgment normal.     Lab Results  Component Value Date   HGBA1C 8.0 (A) 12/18/2023   Vitals:   12/24/23 1446  BP: 136/65  Pulse: 72  SpO2: 99%      12/24/2023    2:46 PM 12/22/2023    2:46 PM 12/22/2023    2:25 PM  Vitals with BMI  Height   5' 3  Weight 193 lbs  198 lbs 6 oz  BMI 34.2  35.15  Systolic 136 176 802  Diastolic 65 84 85  Pulse 72  89     Lipid Panel     Component Value Date/Time   CHOL 206 (H) 11/07/2022 1020   TRIG 201 (H) 11/07/2022 1020   HDL 44 11/07/2022 1020   CHOLHDL 4.7 (H) 11/07/2022 1020   CHOLHDL 3.8 01/14/2016 2308   VLDL 37 01/14/2016 2308   LDLCALC 126 (H) 11/07/2022 1020   LDLDIRECT 62.5 12/01/2011 0832    Clinical Atherosclerotic Cardiovascular Disease (ASCVD): Yes  The ASCVD Risk score (Arnett DK, et al., 2019) failed to calculate for the following reasons:   Risk score cannot be calculated because patient has a  medical history suggesting prior/existing ASCVD    A/P: Diabetes longstanding currently uncontrolled. Patient is able to verbalize appropriate hypoglycemia management plan. Medication adherence appears good. Control is suboptimal due to dietary indiscretion, lack of ability to test sugars due to broken CGM reader and no lancets . -Continued Lantus  (insulin  glargine) 18 units daily -Continued GLP-1 Ozempic  (semaglutide ) 1.5 mg weekly on Saturdays  -Supplied with new Freestyle Libre 3 reader and placed new Freestyle Libre 3 plus sensor -Patient educated on purpose, proper use, and potential adverse effects.  -Extensively discussed pathophysiology of diabetes, recommended lifestyle interventions, dietary effects on blood  sugar control.  -Counseled on s/sx of and management of hypoglycemia.    Hypertension longstanding currently improving with in office pressure of 136/65. Blood pressure goal of <130/80 mmHg. Medication adherence good. Told to bring in home logs and meter to next appointment to evaluate if decrease in dose of amlodipine  is necessary due to dizziness reported.  -Continued Amlodipine  5 mg daily -Continued Carvedilol  25 mg twice daily with meals -Continued Losartan  50 mg daily   Chronic Bronchitis with COPD - Patient educated on purpose, proper use and potential adverse effects of Symbicort  (formoterol  / budesonide ).   Following instruction patient verbalized understanding of treatment plan.  Patient was able to demonstrate appropriate technique while administering dose in office.  Instructed to use Symbicort  (formoterol  / budesonide ) as prescribed in hopes to help breathing and decrease frequency of albuterol .  Written patient instructions provided. Patient verbalized understanding of treatment plan.  Total time in face to face counseling 68 minutes.    Follow-up:  Pharmacist 01/15/24 PCP clinic visit in 12/29/23 Patient seen with Fonda Blase, PharmD Candidate - PY3 student

## 2023-12-24 NOTE — Patient Instructions (Signed)
 It was nice to see you today! Happy we got you restarted on the new reader. We like to see blood pressure numbers like we saw today!  Your goal blood sugar is 80-130 before eating and less than 180 after eating.  Medication Changes:  Take Symbicort  (formoterol  / budesonide ) 2 puffs twice daily and not take the albuterol  as much  Continue all other medication the same.  Monitor blood pressures at home and keep a log (phone or piece of paper) to bring with you to your next visit. Bring in your blood pressure monitor.  Keep up the good work with diet and exercise. Aim for a diet full of vegetables, fruit and lean meats (chicken, [place], fish). Try to limit salt intake by eating fresh or frozen vegetables (instead of canned), rinse canned vegetables prior to cooking and do not add any additional salt to meals.

## 2023-12-25 ENCOUNTER — Telehealth: Payer: Self-pay

## 2023-12-25 NOTE — Progress Notes (Signed)
 Reviewed and agree with Dr Rennis plan.

## 2023-12-25 NOTE — Telephone Encounter (Signed)
 Patient calls nurse line requesting surgical clearance.   She reports she is scheduled for surgery on 9/17 with Dr. Mona.   She reports they are needing surgical clearance prior.   I called Dr. Katharina office for more information.   They report she does not need clearance from us .   Patient has been made aware.

## 2023-12-27 ENCOUNTER — Other Ambulatory Visit (HOSPITAL_COMMUNITY): Payer: Self-pay

## 2023-12-28 ENCOUNTER — Other Ambulatory Visit: Payer: Self-pay | Admitting: Physician Assistant

## 2023-12-28 DIAGNOSIS — Z01818 Encounter for other preprocedural examination: Secondary | ICD-10-CM

## 2023-12-28 DIAGNOSIS — J329 Chronic sinusitis, unspecified: Secondary | ICD-10-CM | POA: Diagnosis not present

## 2023-12-28 DIAGNOSIS — E1142 Type 2 diabetes mellitus with diabetic polyneuropathy: Secondary | ICD-10-CM | POA: Diagnosis not present

## 2023-12-28 DIAGNOSIS — I152 Hypertension secondary to endocrine disorders: Secondary | ICD-10-CM | POA: Diagnosis not present

## 2023-12-28 DIAGNOSIS — E1159 Type 2 diabetes mellitus with other circulatory complications: Secondary | ICD-10-CM | POA: Diagnosis not present

## 2023-12-28 DIAGNOSIS — I2510 Atherosclerotic heart disease of native coronary artery without angina pectoris: Secondary | ICD-10-CM | POA: Diagnosis not present

## 2023-12-28 DIAGNOSIS — M48061 Spinal stenosis, lumbar region without neurogenic claudication: Secondary | ICD-10-CM | POA: Diagnosis not present

## 2023-12-28 DIAGNOSIS — J4489 Other specified chronic obstructive pulmonary disease: Secondary | ICD-10-CM | POA: Diagnosis not present

## 2023-12-28 DIAGNOSIS — G894 Chronic pain syndrome: Secondary | ICD-10-CM | POA: Diagnosis not present

## 2023-12-28 DIAGNOSIS — M5416 Radiculopathy, lumbar region: Secondary | ICD-10-CM | POA: Diagnosis not present

## 2023-12-28 DIAGNOSIS — N2889 Other specified disorders of kidney and ureter: Secondary | ICD-10-CM

## 2023-12-28 NOTE — Progress Notes (Signed)
 Requested pre op orders on P.A. Line.

## 2023-12-28 NOTE — Progress Notes (Unsigned)
    SUBJECTIVE:   CHIEF COMPLAINT / HPI:   Follow-up for hypertension and diabetes Ozempic  was increased to 1.5 mg weekly.  She is on Lantus  18 units daily She was also started on losartan  50 mg daily.  She was seen on 9/2 and started on 5 mg amlodipine .  She is also on Coreg  25 mg twice daily  Reports doing well on Ozempic  Home CBGs:***  Home BP:***  ***  PERTINENT  PMH / PSH: ***  OBJECTIVE:   There were no vitals taken for this visit.  ***  ASSESSMENT/PLAN:   Assessment & Plan    Payton Coward, MD [facility]

## 2023-12-29 ENCOUNTER — Ambulatory Visit (INDEPENDENT_AMBULATORY_CARE_PROVIDER_SITE_OTHER): Admitting: Family Medicine

## 2023-12-29 ENCOUNTER — Encounter: Payer: Self-pay | Admitting: Family Medicine

## 2023-12-29 VITALS — BP 139/62 | HR 91 | Ht 63.0 in | Wt 196.1 lb

## 2023-12-29 DIAGNOSIS — E1159 Type 2 diabetes mellitus with other circulatory complications: Secondary | ICD-10-CM

## 2023-12-29 DIAGNOSIS — E1142 Type 2 diabetes mellitus with diabetic polyneuropathy: Secondary | ICD-10-CM

## 2023-12-29 DIAGNOSIS — I152 Hypertension secondary to endocrine disorders: Secondary | ICD-10-CM

## 2023-12-29 NOTE — Assessment & Plan Note (Signed)
 Stable on current regimen, losartan  amlodipine  and coreg  Routine f/u

## 2023-12-29 NOTE — Patient Instructions (Addendum)
 Continue your current medications   See Dr. Koval as scheduled  Please keep a log of your blood pressure, ideally you should be under 140/90  Blood Pressure Record Sheet To take your blood pressure, you will need a blood pressure machine. You can buy a blood pressure machine (blood pressure monitor) at your clinic, drug store, or online. When choosing one, consider: An automatic monitor that has an arm cuff. A cuff that wraps snugly around your upper arm. You should be able to fit only one finger between your arm and the cuff. A device that stores blood pressure reading results. Do not choose a monitor that measures your blood pressure from your wrist or finger. Follow your health care provider's instructions for how to take your blood pressure. To use this form: Take your blood pressure medications every day These measurements should be taken when you have been at rest for at least 10-15 min Take at least 2 readings with each blood pressure check. This makes sure the results are correct. Wait 1-2 minutes between measurements. Write down the results in the spaces on this form. Keep in mind it should always be recorded systolic over diastolic. Both numbers are important.  Repeat this every day for 2-3 weeks, or as told by your health care provider.  Make a follow-up appointment with your health care provider to discuss the results.  Blood Pressure Log Date Medications taken? (Y/N) Blood Pressure Time of Day

## 2023-12-29 NOTE — Patient Instructions (Signed)
 SURGICAL WAITING ROOM VISITATION Patients having surgery or a procedure may have no more than 2 support people in the waiting area - these visitors may rotate in the visitor waiting room.   If the patient needs to stay at the hospital during part of their recovery, the visitor guidelines for inpatient rooms apply.  PRE-OP VISITATION  Pre-op nurse will coordinate an appropriate time for 1 support person to accompany the patient in pre-op.  This support person may not rotate.  This visitor will be contacted when the time is appropriate for the visitor to come back in the pre-op area.  Please refer to the [facility] website for the visitor guidelines for Inpatients (after your surgery is over and you are in a regular room).  You are not required to quarantine at this time prior to your surgery. However, you must do this: Hand Hygiene often Do NOT share personal items Notify your provider if you are in close contact with someone who has COVID or you develop fever 100.4 or greater, new onset of sneezing, cough, sore throat, shortness of breath or body aches.  If you test positive for Covid or have been in contact with anyone that has tested positive in the last 10 days please notify you surgeon.    Your procedure is scheduled on:  [facility]  January 06, 2024  Report to [facility] Main Entrance: [address] entrance where the [employer] is available.   Report to admitting at:  6:30   AM  Call this number if you have any questions or problems the morning of surgery [phone]  DO NOT EAT OR DRINK ANYTHING AFTER MIDNIGHT THE NIGHT PRIOR TO YOUR SURGERY / PROCEDURE.   FOLLOW  ANY ADDITIONAL PRE OP INSTRUCTIONS YOU RECEIVED FROM YOUR SURGEON'S OFFICE!!!   Oral Hygiene is also important to reduce your risk of infection.        Remember - BRUSH YOUR TEETH THE MORNING OF SURGERY WITH YOUR REGULAR TOOTHPASTE  Do NOT smoke after Midnight the night before surgery.  SEMIGLUTIDE  (Ozempic )- stop injections 10-14 days before surgery. Last injection:  INSULIN  GLARGINE (Lantus )-   Day BEFORE surgery: ???? am- take 18 units  if pm take 50%        DAY OF SURGERY: Take 50% or 9 units.   STOP TAKING all Vitamins, Herbs and supplements 1 week before your surgery.   STOP taking Aspirin  1 week before your surgery.   Take ONLY these medicines the morning of surgery with A SIP OF WATER: oxybutynin, pantoprazole , gabapentin , carvedilol , Amlodipine  ? Takes q hs,   You may use your Eye drops, Inhalers and nebulizer if needed. Please bring your Albuterol  inhaler with you on the day of surgery.   DO NOT TAKE Losartan  the morning of your surgery.   You may not have any metal on your body including hair pins, jewelry, and body piercing  Do not wear make-up, lotions, powders, perfumes or deodorant  Do not wear nail polish including gel and S&S, artificial / acrylic nails, or any other type of covering on natural nails including finger and toenails. If you have artificial nails, gel coating, etc., that needs to be removed by a nail salon, Please have this removed prior to surgery. Not doing so may mean that your surgery could be cancelled or delayed if the Surgeon or anesthesia staff feels like they are unable to monitor you safely.   Do not shave 48 hours prior to surgery to avoid nicks  in your skin which may contribute to postoperative infections.   Contacts, Hearing Aids, dentures or bridgework may not be worn into surgery. DENTURES WILL BE REMOVED PRIOR TO SURGERY PLEASE DO NOT APPLY Poly grip OR ADHESIVES!!!  You may bring a small overnight bag with you on the day of surgery, only pack items that are not valuable. [facility] IS NOT RESPONSIBLE   FOR VALUABLES THAT ARE LOST OR STOLEN.   Do not bring your home medications to the hospital. The Pharmacy will dispense medications listed on your medication list to you during your admission in the Hospital.  Please read over the  following fact sheets you were given: IF YOU HAVE QUESTIONS ABOUT YOUR PRE-OP INSTRUCTIONS, PLEASE CALL [phone].   [place] - Preparing for Surgery Before surgery, you can play an important role.  Because skin is not sterile, your skin needs to be as free of germs as possible.  You can reduce the number of germs on your skin by washing with CHG (chlorahexidine gluconate) soap before surgery.  CHG is an antiseptic cleaner which kills germs and bonds with the skin to continue killing germs even after washing. Please DO NOT use if you have an allergy to CHG or antibacterial soaps.  If your skin becomes reddened/irritated stop using the CHG and inform your nurse when you arrive at Short Stay. Do not shave (including legs and underarms) for at least 48 hours prior to the first CHG shower.  You may shave your face/neck.  Please follow these instructions carefully:  1.  Shower with CHG Soap the night before surgery and the  morning of surgery.  2.  If you choose to wash your hair, wash your hair first as usual with your normal  shampoo.  3.  After you shampoo, rinse your hair and body thoroughly to remove the shampoo.                             4.  Use CHG as you would any other liquid soap.  You can apply chg directly to the skin and wash.  Gently with a scrungie or clean washcloth.  5.  Apply the CHG Soap to your body ONLY FROM THE NECK DOWN.   Do not use on face/ open                           Wound or open sores. Avoid contact with eyes, ears mouth and genitals (private parts).                       Wash face,  Genitals (private parts) with your normal soap.             6.  Wash thoroughly, paying special attention to the area where your  surgery  will be performed.  7.  Thoroughly rinse your body with warm water from the neck down.  8.  DO NOT shower/wash with your normal soap after using and rinsing off the CHG Soap.            9.  Pat yourself dry with a clean towel.            10.  Wear  clean pajamas.            11.  Place clean sheets on your bed the night of your first shower and do not  sleep  with pets.  ON THE DAY OF SURGERY : Do not apply any lotions/deodorants the morning of surgery.  Please wear clean clothes to the hospital/surgery center.    FAILURE TO FOLLOW THESE INSTRUCTIONS MAY RESULT IN THE CANCELLATION OF YOUR SURGERY  PATIENT SIGNATURE_________________________________  NURSE SIGNATURE__________________________________  ________________________________________________________________________

## 2023-12-29 NOTE — Progress Notes (Signed)
 Patient came to her 1300 PST appt at 1030. We did her lab work and gave her the CHG soap and a copy of the surgery instructions. I called her at 1300 and did a phone call PST to complete her visit   COVID Vaccine received:  []  No [x]  Yes Date of any COVID positive Test in last 90 days: none  PCP - Payton Coward, MD at [facility]. Med[facility]ident [facility]  Cardiologist - K. [place] Hilty, [facility]   Chest x-ray -  06-07-2023  2v  Epic  EKG -  06-08-2023  Epic Stress Test - Lexiscan  08-06-2020  Epic ECHO - 05-08-2023  Epic Cardiac Cath -  CT Coronary Calcium  score:   Bowel Prep - [x]  No  []   Yes ______  Pacemaker / ICD device [x]  No []  Yes   Spinal Cord Stimulator:[x]  No []  Yes       History of Sleep Apnea? [x]  No []  Yes   CPAP used?- [x]  No []  Yes    Patient has: []  NO Hx DM   []  Pre-DM   []  DM1  [x]   DM2 Does the patient monitor blood sugar?   []  N/A   []  No [x]  Yes  Last A1c was:  7.8 on 09-21-2023  Repeat     Does patient have a [employer] or Dexcom? []  No [x]  Yes   Fasting Blood Sugar Ranges-  Checks Blood Sugar continuous times a day.  Semiglutide- last injection: 12-26-23 Insulin  glargine (Lantus )- Day before: 18 units q am.  DOS: 50% or 9 units  Blood Thinner / Instructions:  none Aspirin  Instructions:  ASA 81 mg - not taking anymore  ERAS Protocol Ordered: [x]  No  []  Yes Patient is to be NPO after: [place] Prior  Dental hx: []  Dentures:  []  N/A      []  Bridge or Partial:                   []  Loose or Damaged teeth:     Just had full mouth tooth extractions  Activity level: Able to walk up 2 flights of stairs without becoming significantly short of breath or having chest pain?  [x]  No   []    Yes  Patient can perform ADLs without assistance. []  No   [x]   Yes  Anesthesia review: HFpEF, COPD, HTN, DM2, depression, CPA, fibromyalgia, NAFLD, HOH- Left ear,  smoker   Patient denies any S&S of respiratory illness or Covid - no shortness of breath, fever, cough or chest pain at PAT  appointment.  Patient verbalized understanding and agreement to the Pre-Surgical Instructions that were given to them at this PAT appointment. Patient was also educated of the need to review these PAT instructions again prior to her surgery.I reviewed the appropriate phone numbers to call if they have any and questions or concerns.

## 2023-12-29 NOTE — Assessment & Plan Note (Addendum)
 Stable, home CBGs within appropriate range Continue current regimen Routine f/u

## 2023-12-30 ENCOUNTER — Encounter (HOSPITAL_COMMUNITY): Payer: Self-pay

## 2023-12-30 ENCOUNTER — Encounter (HOSPITAL_COMMUNITY)
Admission: RE | Admit: 2023-12-30 | Discharge: 2023-12-30 | Disposition: A | Discharge status: Home / Self Care | Source: Ambulatory Visit

## 2023-12-30 DIAGNOSIS — I25119 Atherosclerotic heart disease of native coronary artery with unspecified angina pectoris: Secondary | ICD-10-CM | POA: Diagnosis not present

## 2023-12-30 DIAGNOSIS — Z01818 Encounter for other preprocedural examination: Secondary | ICD-10-CM | POA: Diagnosis present

## 2023-12-30 DIAGNOSIS — G894 Chronic pain syndrome: Secondary | ICD-10-CM | POA: Insufficient documentation

## 2023-12-30 DIAGNOSIS — N2889 Other specified disorders of kidney and ureter: Secondary | ICD-10-CM | POA: Diagnosis not present

## 2023-12-30 DIAGNOSIS — Z01812 Encounter for preprocedural laboratory examination: Secondary | ICD-10-CM | POA: Diagnosis not present

## 2023-12-30 DIAGNOSIS — E1142 Type 2 diabetes mellitus with diabetic polyneuropathy: Secondary | ICD-10-CM | POA: Diagnosis not present

## 2023-12-30 DIAGNOSIS — K7581 Nonalcoholic steatohepatitis (NASH): Secondary | ICD-10-CM

## 2023-12-30 DIAGNOSIS — K760 Fatty (change of) liver, not elsewhere classified: Secondary | ICD-10-CM

## 2023-12-30 HISTORY — DX: Chronic kidney disease, unspecified: N18.9

## 2023-12-30 LAB — COMPREHENSIVE METABOLIC PANEL WITH GFR
ALT: 8 U/L (ref 0–44)
AST: 15 U/L (ref 15–41)
Albumin: 4.2 g/dL (ref 3.5–5.0)
Alkaline Phosphatase: 73 U/L (ref 38–126)
Anion gap: 11 (ref 5–15)
BUN: 8 mg/dL (ref 8–23)
CO2: 27 mmol/L (ref 22–32)
Calcium: 9.3 mg/dL (ref 8.9–10.3)
Chloride: 105 mmol/L (ref 98–111)
Creatinine, Ser: 0.8 mg/dL (ref 0.44–1.00)
GFR, Estimated: >60 mL/min (ref >60)
Glucose, Bld: 138 mg/dL — ABNORMAL HIGH (ref 70–99)
Potassium: 4.4 mmol/L (ref 3.5–5.1)
Sodium: 143 mmol/L (ref 135–145)
Total Bilirubin: 0.4 mg/dL (ref 0.0–1.2)
Total Protein: 7.8 g/dL (ref 6.5–8.1)

## 2023-12-30 LAB — HEMOGLOBIN A1C
Hgb A1c MFr Bld: 7.5 % — ABNORMAL HIGH (ref 4.8–5.6)
Mean Plasma Glucose: 168.55 mg/dL

## 2023-12-30 LAB — CBC
HCT: 40 % (ref 36.0–46.0)
Hemoglobin: 12.6 g/dL (ref 12.0–15.0)
MCH: 28.1 pg (ref 26.0–34.0)
MCHC: 31.5 g/dL (ref 30.0–36.0)
MCV: 89.1 fL (ref 80.0–100.0)
Platelets: 188 K/uL (ref 150–400)
RBC: 4.49 MIL/uL (ref 3.87–5.11)
RDW: 14.2 % (ref 11.5–15.5)
WBC: 7.2 K/uL (ref 4.0–10.5)
nRBC: 0 % (ref 0.0–0.2)

## 2023-12-30 LAB — PROTIME-INR
INR: 1 (ref 0.8–1.2)
Prothrombin Time: 14.3 s (ref 11.4–15.2)

## 2023-12-30 LAB — GLUCOSE, CAPILLARY: Glucose-Capillary: 127 mg/dL — ABNORMAL HIGH (ref 70–99)

## 2023-12-31 ENCOUNTER — Other Ambulatory Visit: Payer: Self-pay

## 2023-12-31 ENCOUNTER — Encounter (HOSPITAL_COMMUNITY): Payer: Self-pay

## 2023-12-31 NOTE — Patient Instructions (Signed)
 Visit Information  Thank you for taking time to visit with me today. Please don't hesitate to contact me if I can be of assistance to you before our next scheduled appointment.  Your next care management appointment is by telephone on 01/14/2024 at 2pm  Telephone follow up appointment date/time:  01/14/2024 at 2pm  Please call the care guide team at [phone] if you need to cancel, schedule, or reschedule an appointment.   Please call the Suicide and Crisis Lifeline: 988 go to [facility] Urgent [facility] [address] [place] [phone]) call 911 if you are experiencing a Mental Health or Behavioral Health Crisis or need someone to talk to.  [name] [name], BSW [place]/VBCI - [employer] Social Worker [phone]
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
## 2024-01-01 ENCOUNTER — Ambulatory Visit: Admitting: Pulmonary Disease

## 2024-01-01 ENCOUNTER — Encounter: Payer: Self-pay | Admitting: Pulmonary Disease

## 2024-01-04 ENCOUNTER — Other Ambulatory Visit: Payer: Self-pay | Admitting: Radiology

## 2024-01-04 DIAGNOSIS — I2510 Atherosclerotic heart disease of native coronary artery without angina pectoris: Secondary | ICD-10-CM | POA: Diagnosis not present

## 2024-01-04 DIAGNOSIS — I152 Hypertension secondary to endocrine disorders: Secondary | ICD-10-CM | POA: Diagnosis not present

## 2024-01-04 DIAGNOSIS — G894 Chronic pain syndrome: Secondary | ICD-10-CM | POA: Diagnosis not present

## 2024-01-04 DIAGNOSIS — J4489 Other specified chronic obstructive pulmonary disease: Secondary | ICD-10-CM | POA: Diagnosis not present

## 2024-01-04 DIAGNOSIS — N2889 Other specified disorders of kidney and ureter: Secondary | ICD-10-CM

## 2024-01-04 DIAGNOSIS — J329 Chronic sinusitis, unspecified: Secondary | ICD-10-CM | POA: Diagnosis not present

## 2024-01-04 DIAGNOSIS — E1142 Type 2 diabetes mellitus with diabetic polyneuropathy: Secondary | ICD-10-CM | POA: Diagnosis not present

## 2024-01-04 DIAGNOSIS — M48061 Spinal stenosis, lumbar region without neurogenic claudication: Secondary | ICD-10-CM | POA: Diagnosis not present

## 2024-01-04 DIAGNOSIS — M5416 Radiculopathy, lumbar region: Secondary | ICD-10-CM | POA: Diagnosis not present

## 2024-01-04 DIAGNOSIS — E1159 Type 2 diabetes mellitus with other circulatory complications: Secondary | ICD-10-CM | POA: Diagnosis not present

## 2024-01-05 NOTE — Anesthesia Preprocedure Evaluation (Addendum)
 Anesthesia Evaluation  Patient identified by MRN, date of birth, ID band Patient awake    Reviewed: Allergy & Precautions, NPO status , Patient's Chart, lab work & pertinent test results, reviewed documented beta blocker date and time   History of Anesthesia Complications Negative for: history of anesthetic complications  Airway Mallampati: I  TM Distance: >3 FB Neck ROM: Full    Dental  (+) Edentulous Upper, Edentulous Lower   Pulmonary asthma , COPD,  COPD inhaler, Current Smoker and Patient abstained from smoking.   breath sounds clear to auscultation       Cardiovascular hypertension, Pt. on medications and Pt. on home beta blockers + Peripheral Vascular Disease and +CHF   Rhythm:Regular Rate:Normal  04/2023 ECHO: EF 75%.  1. The LV has hyperdynamic function, no regional wall motion abnormalities. There is severe eccentric LVH.    2. RVF is normal. The right ventricular size is normal.   3. A small pericardial effusion is present. The pericardial effusion is  circumferential. There is no evidence of cardiac tamponade.   4. The mitral valve is normal in structure. Trivial mitral valve regurgitation. No evidence of mitral stenosis.   5. The aortic valve is grossly normal. There is mild calcification of the aortic valve. There is mild thickening of the aortic valve. Aortic valve regurgitation is not visualized. Aortic valve sclerosis/calcification is present, without any evidence of aortic stenosis.   6. There is Moderate (Grade III) protruding plaque involving the descending aorta.     Neuro/Psych  Headaches  Anxiety Depression    CVA (L sided weakness), Residual Symptoms    GI/Hepatic Neg liver ROS, hiatal hernia,GERD  Medicated and Controlled,,  Endo/Other  diabetes (glu 123), Insulin  Dependent  Ozempic : last 12/26/2023 BMI 34  Renal/GU negative Renal ROS     Musculoskeletal   Abdominal   Peds  Hematology Hb 12.6,  plt 188k   Anesthesia Other Findings   Reproductive/Obstetrics                              Anesthesia Physical Anesthesia Plan  ASA: 3  Anesthesia Plan: General   Post-op Pain Management: Tylenol  PO (pre-op)* and Minimal or no pain anticipated   Induction: Intravenous  PONV Risk Score and Plan: 2 and Ondansetron  and Dexamethasone   Airway Management Planned: Oral ETT  Additional Equipment: None  Intra-op Plan:   Post-operative Plan: Extubation in OR  Informed Consent: I have reviewed the patients History and Physical, chart, labs and discussed the procedure including the risks, benefits and alternatives for the proposed anesthesia with the patient or authorized representative who has indicated his/her understanding and acceptance.     Dental advisory given  Plan Discussed with: CRNA and Surgeon  Anesthesia Plan Comments: (See PAT note from 9/10)         Anesthesia Quick Evaluation
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
## 2024-01-06 ENCOUNTER — Other Ambulatory Visit: Payer: Self-pay

## 2024-01-06 ENCOUNTER — Ambulatory Visit (HOSPITAL_BASED_OUTPATIENT_CLINIC_OR_DEPARTMENT_OTHER): Payer: Self-pay | Admitting: Certified Registered Nurse Anesthetist

## 2024-01-06 ENCOUNTER — Encounter (HOSPITAL_COMMUNITY): Payer: Self-pay

## 2024-01-06 ENCOUNTER — Ambulatory Visit (HOSPITAL_COMMUNITY): Admission: RE | Admit: 2024-01-06 | Discharge: 2024-01-06 | Disposition: A | Discharge status: Home / Self Care

## 2024-01-06 ENCOUNTER — Ambulatory Visit (HOSPITAL_COMMUNITY)

## 2024-01-06 ENCOUNTER — Ambulatory Visit (HOSPITAL_COMMUNITY)
Admission: RE | Admit: 2024-01-06 | Discharge: 2024-01-06 | Disposition: A | Discharge status: Home / Self Care | Source: Ambulatory Visit

## 2024-01-06 ENCOUNTER — Ambulatory Visit (HOSPITAL_COMMUNITY): Payer: Self-pay | Admitting: Medical

## 2024-01-06 ENCOUNTER — Encounter (HOSPITAL_COMMUNITY): Admission: RE | Disposition: A | Discharge status: Home / Self Care | Payer: Self-pay | Source: Home / Self Care

## 2024-01-06 DIAGNOSIS — K2100 Gastro-esophageal reflux disease with esophagitis, without bleeding: Secondary | ICD-10-CM | POA: Insufficient documentation

## 2024-01-06 DIAGNOSIS — K449 Diaphragmatic hernia without obstruction or gangrene: Secondary | ICD-10-CM | POA: Insufficient documentation

## 2024-01-06 DIAGNOSIS — E1151 Type 2 diabetes mellitus with diabetic peripheral angiopathy without gangrene: Secondary | ICD-10-CM | POA: Insufficient documentation

## 2024-01-06 DIAGNOSIS — N281 Cyst of kidney, acquired: Secondary | ICD-10-CM | POA: Insufficient documentation

## 2024-01-06 DIAGNOSIS — E1142 Type 2 diabetes mellitus with diabetic polyneuropathy: Secondary | ICD-10-CM | POA: Diagnosis not present

## 2024-01-06 DIAGNOSIS — K219 Gastro-esophageal reflux disease without esophagitis: Secondary | ICD-10-CM | POA: Diagnosis not present

## 2024-01-06 DIAGNOSIS — I5032 Chronic diastolic (congestive) heart failure: Secondary | ICD-10-CM | POA: Diagnosis not present

## 2024-01-06 DIAGNOSIS — F419 Anxiety disorder, unspecified: Secondary | ICD-10-CM | POA: Insufficient documentation

## 2024-01-06 DIAGNOSIS — I2510 Atherosclerotic heart disease of native coronary artery without angina pectoris: Secondary | ICD-10-CM | POA: Diagnosis not present

## 2024-01-06 DIAGNOSIS — Z01818 Encounter for other preprocedural examination: Secondary | ICD-10-CM

## 2024-01-06 DIAGNOSIS — F32A Depression, unspecified: Secondary | ICD-10-CM | POA: Diagnosis not present

## 2024-01-06 DIAGNOSIS — N2889 Other specified disorders of kidney and ureter: Secondary | ICD-10-CM | POA: Diagnosis not present

## 2024-01-06 DIAGNOSIS — I509 Heart failure, unspecified: Secondary | ICD-10-CM | POA: Diagnosis not present

## 2024-01-06 DIAGNOSIS — Z79899 Other long term (current) drug therapy: Secondary | ICD-10-CM | POA: Diagnosis not present

## 2024-01-06 DIAGNOSIS — Z794 Long term (current) use of insulin: Secondary | ICD-10-CM | POA: Insufficient documentation

## 2024-01-06 DIAGNOSIS — I110 Hypertensive heart disease with heart failure: Secondary | ICD-10-CM | POA: Diagnosis not present

## 2024-01-06 DIAGNOSIS — I517 Cardiomegaly: Secondary | ICD-10-CM | POA: Diagnosis not present

## 2024-01-06 DIAGNOSIS — Z7985 Long-term (current) use of injectable non-insulin antidiabetic drugs: Secondary | ICD-10-CM | POA: Diagnosis not present

## 2024-01-06 DIAGNOSIS — F17210 Nicotine dependence, cigarettes, uncomplicated: Secondary | ICD-10-CM

## 2024-01-06 DIAGNOSIS — I69359 Hemiplegia and hemiparesis following cerebral infarction affecting unspecified side: Secondary | ICD-10-CM | POA: Diagnosis not present

## 2024-01-06 DIAGNOSIS — N289 Disorder of kidney and ureter, unspecified: Secondary | ICD-10-CM | POA: Diagnosis not present

## 2024-01-06 DIAGNOSIS — I700 Atherosclerosis of aorta: Secondary | ICD-10-CM | POA: Diagnosis not present

## 2024-01-06 DIAGNOSIS — J45909 Unspecified asthma, uncomplicated: Secondary | ICD-10-CM | POA: Diagnosis not present

## 2024-01-06 HISTORY — PX: RADIOLOGY WITH ANESTHESIA: SHX6223

## 2024-01-06 LAB — GLUCOSE, CAPILLARY
Glucose-Capillary: 123 mg/dL — ABNORMAL HIGH (ref 70–99)
Glucose-Capillary: 95 mg/dL (ref 70–99)

## 2024-01-06 LAB — ABO/RH: ABO/RH(D): B POS

## 2024-01-06 LAB — TYPE AND SCREEN
ABO/RH(D): B POS
Antibody Screen: NEGATIVE

## 2024-01-06 LAB — BASIC METABOLIC PANEL WITH GFR
Anion gap: 12 (ref 5–15)
BUN: 11 mg/dL (ref 8–23)
CO2: 25 mmol/L (ref 22–32)
Calcium: 9.3 mg/dL (ref 8.9–10.3)
Chloride: 105 mmol/L (ref 98–111)
Creatinine, Ser: 0.8 mg/dL (ref 0.44–1.00)
GFR, Estimated: >60 mL/min (ref >60)
Glucose, Bld: 135 mg/dL — ABNORMAL HIGH (ref 70–99)
Potassium: 3.9 mmol/L (ref 3.5–5.1)
Sodium: 141 mmol/L (ref 135–145)

## 2024-01-06 SURGERY — CT WITH ANESTHESIA
Anesthesia: General

## 2024-01-06 MED ORDER — SODIUM CHLORIDE 0.9 % IV SOLN
INTRAVENOUS | Status: AC
  Filled 2024-01-06: qty 250

## 2024-01-06 MED ORDER — CHLORHEXIDINE GLUCONATE CLOTH 2 % EX PADS: 6.0000 | MEDICATED_PAD | Freq: Every day | CUTANEOUS | Status: DC

## 2024-01-06 MED ORDER — ACETAMINOPHEN 500 MG PO TABS
1000.0000 mg | ORAL_TABLET | Freq: Once | ORAL | Status: AC
  Administered 2024-01-06: 1000 mg via ORAL
  Filled 2024-01-06: qty 2

## 2024-01-06 MED ORDER — OXYCODONE HCL 5 MG PO TABS: 5.0000 mg | ORAL_TABLET | Freq: Once | ORAL | Status: DC | PRN

## 2024-01-06 MED ORDER — FENTANYL CITRATE PF 50 MCG/ML IJ SOSY: 25.0000 ug | PREFILLED_SYRINGE | INTRAMUSCULAR | Status: DC | PRN

## 2024-01-06 MED ORDER — ONDANSETRON HCL 4 MG/2ML IJ SOLN
INTRAMUSCULAR | Status: DC | PRN
  Administered 2024-01-06: 4 mg via INTRAVENOUS

## 2024-01-06 MED ORDER — SODIUM CHLORIDE 0.9 % IV SOLN
INTRAVENOUS | Status: DC
Start: 2024-01-06 — End: 2024-01-06

## 2024-01-06 MED ORDER — PHENYLEPHRINE 80 MCG/ML (10ML) SYRINGE FOR IV PUSH (FOR BLOOD PRESSURE SUPPORT)
PREFILLED_SYRINGE | INTRAVENOUS | Status: DC | PRN
  Administered 2024-01-06: 160 ug via INTRAVENOUS
  Administered 2024-01-06 (×2): 80 ug via INTRAVENOUS

## 2024-01-06 MED ORDER — SUGAMMADEX SODIUM 200 MG/2ML IV SOLN
INTRAVENOUS | Status: DC | PRN
  Administered 2024-01-06: 200 mg via INTRAVENOUS

## 2024-01-06 MED ORDER — SODIUM CHLORIDE 0.9 % IV SOLN
INTRAVENOUS | Status: AC
  Filled 2024-01-06: qty 1000

## 2024-01-06 MED ORDER — MIDAZOLAM HCL 2 MG/2ML IJ SOLN: 0.5000 mg | Freq: Once | INTRAMUSCULAR | Status: DC | PRN

## 2024-01-06 MED ORDER — ROCURONIUM BROMIDE 100 MG/10ML IV SOLN
INTRAVENOUS | Status: DC | PRN
  Administered 2024-01-06: 60 mg via INTRAVENOUS

## 2024-01-06 MED ORDER — CHLORHEXIDINE GLUCONATE 0.12 % MT SOLN: 15.0000 mL | Freq: Once | OROMUCOSAL | Status: DC

## 2024-01-06 MED ORDER — PROPOFOL 10 MG/ML IV BOLUS
INTRAVENOUS | Status: DC | PRN
  Administered 2024-01-06: 140 mg via INTRAVENOUS

## 2024-01-06 MED ORDER — OXYCODONE HCL 5 MG PO TABS: 5.0000 mg | ORAL_TABLET | Freq: Four times a day (QID) | ORAL | 0 refills | Status: DC | PRN

## 2024-01-06 MED ORDER — FENTANYL CITRATE (PF) 100 MCG/2ML IJ SOLN
INTRAMUSCULAR | Status: DC | PRN
  Administered 2024-01-06: 50 ug via INTRAVENOUS

## 2024-01-06 MED ORDER — ORAL CARE MOUTH RINSE: 15.0000 mL | Freq: Once | OROMUCOSAL | Status: DC

## 2024-01-06 MED ORDER — PHENYLEPHRINE HCL-NACL 20-0.9 MG/250ML-% IV SOLN
INTRAVENOUS | Status: AC
  Filled 2024-01-06: qty 250

## 2024-01-06 MED ORDER — FENTANYL CITRATE PF 50 MCG/ML IJ SOSY
PREFILLED_SYRINGE | INTRAMUSCULAR | Status: AC
  Filled 2024-01-06: qty 2

## 2024-01-06 MED ORDER — LACTATED RINGERS IV SOLN: INTRAVENOUS | Status: DC

## 2024-01-06 MED ORDER — OXYCODONE HCL 5 MG/5ML PO SOLN: 5.0000 mg | Freq: Once | ORAL | Status: DC | PRN

## 2024-01-06 MED ORDER — LIDOCAINE HCL (CARDIAC) PF 100 MG/5ML IV SOSY
PREFILLED_SYRINGE | INTRAVENOUS | Status: DC | PRN
  Administered 2024-01-06: 20 mg via INTRAVENOUS

## 2024-01-06 NOTE — Anesthesia Postprocedure Evaluation (Signed)
 Anesthesia Post Note  Patient: [name] [name]  Procedure(s) Performed: CT WITH ANESTHESIA     Patient location during evaluation: PACU Anesthesia Type: General Level of consciousness: awake and alert, oriented and patient cooperative Pain management: pain level controlled Vital Signs Assessment: post-procedure vital signs reviewed and stable Respiratory status: spontaneous breathing, nonlabored ventilation and respiratory function stable Cardiovascular status: blood pressure returned to baseline and stable Postop Assessment: no apparent nausea or vomiting Anesthetic complications: no   No notable events documented.  Last Vitals:  Vitals:   01/06/24 0712 01/06/24 1138  BP: (!) 167/72 (!) 166/62  Pulse: 72 65  Resp: 16 10  Temp: 37 C 36.6 C  SpO2: 98% 99%    Last Pain:  Vitals:   01/06/24 1138  TempSrc:   PainSc: 0-No pain                 Jadie Comas,E. Louis Gaw

## 2024-01-06 NOTE — Progress Notes (Signed)
 Patient ID: [name] [name], female   DOB: 25-Aug-1950, 73 y.o.   MRN: [id number] Patient seen in PACU today with Dr. Jenna.  She is status post left renal lesion cryoablation earlier today.  VSS. Patient currently just finished eating lunch.  Reports some mild left flank discomfort.  No fever, respiratory issues, nausea, vomiting.  Puncture site left flank clean, dry, no obvious hematoma, mildly tender to palpation.  Foley catheter was removed earlier with no signs of hematuria.  Awaiting for patient to void on own prior to discharge home.  Patient will be scheduled for follow-up with Dr. Jenna in the IR clinic in 4 weeks.  She can resume her usual home medications.  She is to avoid strenuous activity for 1 week.  She will contact our team with any additional questions or concerns.

## 2024-01-06 NOTE — Discharge Instructions (Addendum)
 Avoid strenuous activity for 1 week.  May resume home medications.  Stay well-hydrated.  Radiology team will follow-up with you in 4 weeks. Call [phone] with any questions regarding your recent kidney procedure.  May shower;  may change and apply new bandage to puncture site left flank daily for the next 3 days.

## 2024-01-06 NOTE — Sedation Documentation (Signed)
 Anesthesia in to sedate and monitor.

## 2024-01-06 NOTE — Transfer of Care (Signed)
 Immediate Anesthesia Transfer of Care Note  Patient: Cleona Laughridge  Procedure(s) Performed: CT WITH ANESTHESIA  Patient Location: PACU  Anesthesia Type:General  Level of Consciousness: awake, alert , and oriented  Airway & Oxygen Therapy: Patient Spontanous Breathing  Post-op Assessment: Report given to RN and Post -op Vital signs reviewed and stable  Post vital signs: Reviewed and stable  Last Vitals:  Vitals Value Taken Time  BP    Temp    Pulse 65 01/06/24 11:38  Resp 10 01/06/24 11:38  SpO2 99 % 01/06/24 11:38    Last Pain:  Vitals:   01/06/24 9287  TempSrc: Oral  PainSc: 0-No pain         Complications: No notable events documented.

## 2024-01-06 NOTE — Anesthesia Procedure Notes (Signed)
 Procedure Name: Intubation Date/Time: 01/06/2024 9:17 AM  Performed by: [place], Corean BROCKS, CRNAPre-anesthesia Checklist: Patient identified, Emergency Drugs available, Suction available and Patient being monitored Patient Re-evaluated:Patient Re-evaluated prior to induction Oxygen Delivery Method: Circle system utilized Preoxygenation: Pre-oxygenation with 100% oxygen Induction Type: IV induction Ventilation: Mask ventilation without difficulty Laryngoscope Size: Mac and 3 Grade View: Grade I Tube type: Oral Tube size: 7.0 mm Number of attempts: 1 Airway Equipment and Method: Stylet and Oral airway Placement Confirmation: ETT inserted through vocal cords under direct vision, positive ETCO2 and breath sounds checked- equal and bilateral Secured at: 21 cm Tube secured with: Tape Dental Injury: Teeth and Oropharynx as per pre-operative assessment

## 2024-01-06 NOTE — Procedures (Signed)
 Interventional Radiology Procedure Note  Procedure: CT guided cryoablation of a left renal bosniac III lesion  Complications: None  Estimated Blood Loss: < 10 mL  Findings: One IceForce cryo probe used for left lower pole cystic mass ablation.  Cordella DELENA Banner, MD

## 2024-01-07 ENCOUNTER — Encounter (HOSPITAL_COMMUNITY): Payer: Self-pay | Admitting: Radiology

## 2024-01-14 ENCOUNTER — Telehealth: Payer: Self-pay

## 2024-01-14 DIAGNOSIS — E1142 Type 2 diabetes mellitus with diabetic polyneuropathy: Secondary | ICD-10-CM | POA: Diagnosis not present

## 2024-01-14 DIAGNOSIS — J329 Chronic sinusitis, unspecified: Secondary | ICD-10-CM | POA: Diagnosis not present

## 2024-01-14 DIAGNOSIS — I152 Hypertension secondary to endocrine disorders: Secondary | ICD-10-CM | POA: Diagnosis not present

## 2024-01-14 DIAGNOSIS — I2510 Atherosclerotic heart disease of native coronary artery without angina pectoris: Secondary | ICD-10-CM | POA: Diagnosis not present

## 2024-01-14 DIAGNOSIS — M48061 Spinal stenosis, lumbar region without neurogenic claudication: Secondary | ICD-10-CM | POA: Diagnosis not present

## 2024-01-14 DIAGNOSIS — M5416 Radiculopathy, lumbar region: Secondary | ICD-10-CM | POA: Diagnosis not present

## 2024-01-14 DIAGNOSIS — G894 Chronic pain syndrome: Secondary | ICD-10-CM | POA: Diagnosis not present

## 2024-01-14 DIAGNOSIS — J4489 Other specified chronic obstructive pulmonary disease: Secondary | ICD-10-CM | POA: Diagnosis not present

## 2024-01-14 DIAGNOSIS — E1159 Type 2 diabetes mellitus with other circulatory complications: Secondary | ICD-10-CM | POA: Diagnosis not present

## 2024-01-15 ENCOUNTER — Ambulatory Visit: Admitting: Pharmacist

## 2024-01-20 DIAGNOSIS — M5416 Radiculopathy, lumbar region: Secondary | ICD-10-CM | POA: Diagnosis not present

## 2024-01-20 DIAGNOSIS — M48061 Spinal stenosis, lumbar region without neurogenic claudication: Secondary | ICD-10-CM | POA: Diagnosis not present

## 2024-01-20 DIAGNOSIS — J4489 Other specified chronic obstructive pulmonary disease: Secondary | ICD-10-CM | POA: Diagnosis not present

## 2024-01-20 DIAGNOSIS — E1159 Type 2 diabetes mellitus with other circulatory complications: Secondary | ICD-10-CM | POA: Diagnosis not present

## 2024-01-20 DIAGNOSIS — G894 Chronic pain syndrome: Secondary | ICD-10-CM | POA: Diagnosis not present

## 2024-01-20 DIAGNOSIS — I152 Hypertension secondary to endocrine disorders: Secondary | ICD-10-CM | POA: Diagnosis not present

## 2024-01-20 DIAGNOSIS — E1142 Type 2 diabetes mellitus with diabetic polyneuropathy: Secondary | ICD-10-CM | POA: Diagnosis not present

## 2024-01-20 DIAGNOSIS — J329 Chronic sinusitis, unspecified: Secondary | ICD-10-CM | POA: Diagnosis not present

## 2024-01-21 ENCOUNTER — Other Ambulatory Visit: Payer: Self-pay

## 2024-01-21 ENCOUNTER — Institutional Professional Consult (permissible substitution) (INDEPENDENT_AMBULATORY_CARE_PROVIDER_SITE_OTHER)
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
## 2024-01-21 NOTE — Patient Instructions (Signed)
 Visit Information  Thank you for taking time to visit with me today. Please don't hesitate to contact me if I can be of assistance to you before our next scheduled appointment.  Your next care management appointment is by telephone on 02/03/2024 at 3pm  Telephone follow up appointment date/time:  02/03/2024 at 3pm  Please call the care guide team at [phone] if you need to cancel, schedule, or reschedule an appointment.   Please call the Suicide and Crisis Lifeline: 988 go to [facility] Urgent [facility] [address], [place] [phone]) call 911 if you are experiencing a Mental Health or Behavioral Health Crisis or need someone to talk to.  [name] [name], BSW /VBCI - [employer] Social Worker [phone]

## 2024-01-22 ENCOUNTER — Ambulatory Visit: Admitting: Pharmacist

## 2024-01-25 ENCOUNTER — Institutional Professional Consult (permissible substitution) (INDEPENDENT_AMBULATORY_CARE_PROVIDER_SITE_OTHER)

## 2024-01-25 DIAGNOSIS — E1142 Type 2 diabetes mellitus with diabetic polyneuropathy: Secondary | ICD-10-CM | POA: Diagnosis not present

## 2024-01-25 DIAGNOSIS — E1159 Type 2 diabetes mellitus with other circulatory complications: Secondary | ICD-10-CM | POA: Diagnosis not present

## 2024-01-25 DIAGNOSIS — J4489 Other specified chronic obstructive pulmonary disease: Secondary | ICD-10-CM | POA: Diagnosis not present

## 2024-01-25 DIAGNOSIS — G894 Chronic pain syndrome: Secondary | ICD-10-CM | POA: Diagnosis not present

## 2024-01-25 DIAGNOSIS — J329 Chronic sinusitis, unspecified: Secondary | ICD-10-CM | POA: Diagnosis not present

## 2024-01-25 DIAGNOSIS — I2510 Atherosclerotic heart disease of native coronary artery without angina pectoris: Secondary | ICD-10-CM | POA: Diagnosis not present

## 2024-01-27 ENCOUNTER — Ambulatory Visit
Admission: RE | Admit: 2024-01-27 | Discharge: 2024-01-27 | Disposition: A | Discharge status: Home / Self Care | Source: Ambulatory Visit | Attending: Radiology | Admitting: Radiology

## 2024-01-27 ENCOUNTER — Other Ambulatory Visit: Payer: Self-pay | Admitting: Student

## 2024-01-27 DIAGNOSIS — N2889 Other specified disorders of kidney and ureter: Secondary | ICD-10-CM | POA: Diagnosis not present

## 2024-01-27 DIAGNOSIS — Z9889 Other specified postprocedural states: Secondary | ICD-10-CM | POA: Diagnosis not present

## 2024-01-27 HISTORY — PX: IR RADIOLOGIST EVAL & MGMT: IMG5224

## 2024-01-27 NOTE — Progress Notes (Signed)
 Referring Physician(s): Allred,Darrell K   Patient Status:  [facility]  Chief Complaint: S/p left renal mass ablation   Subjective:  [name] [name] is a 73 yo with a history of a left renal mass.  She underwent CT guided cryoablation of the lesion on 01/06/2024.  Discharged the same day.  She returns to the IR office with no new complaints.  We discussed the procedure and outcome as well as reviewing the CT images of the procedure to facilitate education on what was accomplished.  We again discussed the expected follow up routine for masses treated with cryoablation.  All of her questions about the procedure and the follow up were answered to her satisfaction.  Allergies: Desvenlafaxine, Duloxetine , Levofloxacin , Latex, and Lithium  Medications: Prior to Admission medications   Medication Sig Start Date End Date Taking? Authorizing Provider  albuterol  (PROVENTIL ) (2.5 MG/3ML) 0.083% nebulizer solution Take 3 mLs (2.5 mg total) by nebulization every 6 (six) hours as needed for wheezing or shortness of breath. 09/21/23   Romelle Booty, MD  albuterol  (VENTOLIN  HFA) 108 (90 Base) MCG/ACT inhaler Inhale 2 puffs into the lungs every 6 (six) hours as needed for wheezing or shortness of breath. 03/13/23   McDiarmid, Krystal BIRCH, MD  amLODipine  (NORVASC ) 5 MG tablet Take 1 tablet (5 mg total) by mouth at bedtime. 12/22/23   Cleotilde Perkins, DO  aspirin  EC 81 MG tablet Take 81 mg by mouth daily.    [provider]  Aspirin -Acetaminophen -Caffeine  (EXCEDRIN EXTRA STRENGTH PO) Take 2 tablets by mouth 2 (two) times daily as needed (headaches).    [provider]  bisacodyl (DULCOLAX) 5 MG EC tablet Take 10 mg by mouth daily as needed for moderate constipation.    [provider]  Blood Glucose Monitoring Suppl (ACCU-CHEK GUIDE) w/Device KIT 1 Device by Does not apply route 4 (four) times daily. 01/05/23   Romelle Booty, MD  budesonide -formoterol  (SYMBICORT ) 80-4.5  MCG/ACT inhaler Inhale 2 puffs into the lungs 2 (two) times daily. 09/21/23   Romelle Booty, MD  carvedilol  (COREG ) 25 MG tablet Take 1 tablet (25 mg total) by mouth 2 (two) times daily. 06/04/23 01/06/24  Jerilynn Lamarr HERO, NP  cetirizine  (ZYRTEC ) 10 MG tablet Take 1 tablet (10 mg total) by mouth daily. 11/07/22   Romelle Booty, MD  Continuous Glucose Receiver (FREESTYLE LIBRE 3 READER) DEVI 1 Device by Combination route See admin instructions. 03/13/23   [provider]  Continuous Glucose Sensor (FREESTYLE LIBRE 3 SENSOR) MISC Place 1 sensor on the skin every 14 days. Use to check glucose continuously 12/18/23   Romelle Booty, MD  dorzolamide  (TRUSOPT ) 2 % ophthalmic solution Place 2 drops into both eyes 2 (two) times daily. 06/09/23   Romelle Booty, MD  fluticasone  (FLONASE ) 50 MCG/ACT nasal spray Place 2 sprays into both nostrils daily as needed for allergies. 05/29/17   [provider]  gabapentin  (NEURONTIN ) 300 MG capsule Take 1 capsule (300 mg total) by mouth 3 (three) times daily. Patient taking differently: Take 600 mg by mouth 3 (three) times daily. 12/18/23   Romelle Booty, MD  hydrOXYzine  (ATARAX ) 25 MG tablet Take 1 tablet (25 mg total) by mouth 3 (three) times daily as needed. Patient taking differently: Take 25 mg by mouth 3 (three) times daily as needed for itching. 08/15/22   Austin Ade, MD  insulin  glargine (LANTUS  SOLOSTAR) 100 UNIT/ML Solostar Pen Inject 18 Units into the skin daily. 02/23/23   Romelle Booty, MD  Insulin   Pen Needle (BD PEN NEEDLE NANO 2ND GEN) 32G X 4 MM MISC Inject 1 Container into the skin as needed. 03/27/23   McDiarmid, Krystal BIRCH, MD  Insulin  Pen Needle 32G X 4 MM MISC 1 Container by Does not apply route daily as needed (Injection twice daily).    [provider]  losartan  (COZAAR ) 50 MG tablet Take 1 tablet (50 mg total) by mouth at bedtime. 12/18/23   Mahmood, Atif, MD  Menthol, Topical Analgesic, (ICY HOT EX) Apply 1 Application topically  daily as needed (pain).    [provider]  metoCLOPramide  (REGLAN ) 5 MG tablet Take 1 tablet (5 mg total) by mouth 3 (three) times daily before meals. 08/28/22   Esterwood, Amy S, PA-C  nystatin  (MYCOSTATIN ) 100000 UNIT/ML suspension Take 2 mLs (200,000 Units total) by mouth 4 (four) times daily. Apply 1mL to each cheek Patient taking differently: Take 4 mLs by mouth in the morning and at bedtime. 03/27/23   McDiarmid, Krystal BIRCH, MD  nystatin  (MYCOSTATIN /NYSTOP ) powder Apply topically 3 (three) times daily. To affected area 03/27/23   McDiarmid, Krystal BIRCH, MD  ondansetron  (ZOFRAN -ODT) 4 MG disintegrating tablet Take 1 tablet (4 mg total) by mouth every 8 (eight) hours as needed for nausea. 09/21/23   Romelle Booty, MD  oxybutynin (DITROPAN-XL) 10 MG 24 hr tablet Take 10 mg by mouth daily. 10/15/21   [provider]  oxyCODONE  (ROXICODONE ) 5 MG immediate release tablet Take 1 tablet (5 mg total) by mouth every 6 (six) hours as needed for severe pain (pain score 7-10). 01/06/24   Allred, Darrell K, PA-C  pantoprazole  (PROTONIX ) 40 MG tablet TAKE 1 TABLET(40 MG) BY MOUTH TWICE DAILY 10/30/23   Armbruster, Elspeth SQUIBB, MD  Polyethylene Glycol 400 (BLINK TEARS OP) Place 2 drops into both eyes 4 (four) times daily as needed (dry eyes).    [provider]  QUEtiapine  (SEROQUEL  XR) 200 MG 24 hr tablet Take 1 tablet (200 mg total) by mouth at bedtime. 03/13/23   McDiarmid, Krystal BIRCH, MD  Semaglutide , 1 MG/DOSE, 4 MG/3ML SOPN Inject 1 mg into the skin once a week. 12/18/23   Romelle Booty, MD  terbinafine  (LAMISIL ) 1 % cream Apply 1 Application topically 2 (two) times daily. Apply under belly 03/27/23   McDiarmid, Krystal BIRCH, MD  triamcinolone  ointment (KENALOG ) 0.1 % Apply 1 Application topically 2 (two) times daily. 03/27/23   McDiarmid, Krystal BIRCH, MD  TRUEplus Lancets 33G MISC TEST BLOOD SUGAR 3 TO 4 TIMES DAILY 12/24/23   McDiarmid, Krystal BIRCH, MD     Vital Signs: BP (!) 174/79 (BP Location: Left Arm)   Pulse  86   Temp 98.3 F (36.8 C)   Resp 18   SpO2 95%   Physical Exam  Imaging: No results found.  Labs:  CBC: Recent Labs    06/07/23 1424 06/08/23 0048 06/09/23 0705 12/30/23 1109  WBC 9.3 8.4 13.4* 7.2  HGB 12.8 11.3* 11.0* 12.6  HCT 39.3 34.7* 34.1* 40.0  PLT 307 261 257 188    COAGS: Recent Labs    05/22/23 1123 12/30/23 1109  INR 1.0 1.0    BMP: Recent Labs    06/08/23 0048 06/09/23 0705 12/18/23 1113 12/22/23 1457 12/30/23 1109 01/06/24 0737  NA 138 139 143  --  143 141  K 3.0* 3.8 4.3  --  4.4 3.9  CL 104 104 103  --  105 105  CO2 24 23 26   --  27 25  GLUCOSE 214*  296* 118* 179* 138* 135*  BUN 8 14 7*  --  8 11  CALCIUM  7.8* 8.6* 9.1  --  9.3 9.3  CREATININE 0.76 0.92 0.83  --  0.80 0.80  GFRNONAA >60 >60  --   --  >60 >60    LIVER FUNCTION TESTS: Recent Labs    05/22/23 1136 06/07/23 1626 12/30/23 1109  BILITOT 0.2 0.6 0.4  AST 13 16 15   ALT 10 16 8   ALKPHOS 72 53 73  PROT 6.9 7.5 7.8  ALBUMIN 3.9 3.0* 4.2    Assessment and Plan:  S/p renal cryoablation on the left side.  Follow up CT and office/phone visit in one year (as calculated from her procedure date).  Electronically Signed: Cordella DELENA Banner, MD 01/27/2024, 8:56 PM   I spent a total of 25 Minutes at the the patient's bedside AND on the patient's hospital floor or unit, greater than 50% of which was counseling/coordinating care for left renal mass.

## 2024-01-29 ENCOUNTER — Other Ambulatory Visit: Payer: Self-pay | Admitting: Physician Assistant

## 2024-02-02 ENCOUNTER — Other Ambulatory Visit: Payer: Self-pay | Admitting: Family Medicine

## 2024-02-02 DIAGNOSIS — Z9109 Other allergy status, other than to drugs and biological substances: Secondary | ICD-10-CM

## 2024-02-03 ENCOUNTER — Telehealth: Payer: Self-pay

## 2024-02-03 DIAGNOSIS — I152 Hypertension secondary to endocrine disorders: Secondary | ICD-10-CM | POA: Diagnosis not present

## 2024-02-03 DIAGNOSIS — G894 Chronic pain syndrome: Secondary | ICD-10-CM | POA: Diagnosis not present

## 2024-02-03 DIAGNOSIS — M48061 Spinal stenosis, lumbar region without neurogenic claudication: Secondary | ICD-10-CM | POA: Diagnosis not present

## 2024-02-03 DIAGNOSIS — E1159 Type 2 diabetes mellitus with other circulatory complications: Secondary | ICD-10-CM | POA: Diagnosis not present

## 2024-02-03 DIAGNOSIS — E1142 Type 2 diabetes mellitus with diabetic polyneuropathy: Secondary | ICD-10-CM | POA: Diagnosis not present

## 2024-02-03 DIAGNOSIS — J4489 Other specified chronic obstructive pulmonary disease: Secondary | ICD-10-CM | POA: Diagnosis not present

## 2024-02-03 DIAGNOSIS — J329 Chronic sinusitis, unspecified: Secondary | ICD-10-CM | POA: Diagnosis not present

## 2024-02-03 DIAGNOSIS — I2510 Atherosclerotic heart disease of native coronary artery without angina pectoris: Secondary | ICD-10-CM | POA: Diagnosis not present

## 2024-02-04 ENCOUNTER — Telehealth: Payer: Self-pay

## 2024-02-04 ENCOUNTER — Institutional Professional Consult (permissible substitution) (INDEPENDENT_AMBULATORY_CARE_PROVIDER_SITE_OTHER)

## 2024-02-04 NOTE — Telephone Encounter (Signed)
 Chyrl PT with [facility] calls nurse line requesting verbal orders for [facility] PT as follows.   1x a week for 8 weeks   VO given per [facility] protocol.

## 2024-02-05 ENCOUNTER — Ambulatory Visit: Admitting: Pharmacist

## 2024-02-10 ENCOUNTER — Telehealth: Payer: Self-pay

## 2024-02-10 ENCOUNTER — Other Ambulatory Visit: Payer: Self-pay

## 2024-02-10 DIAGNOSIS — M5416 Radiculopathy, lumbar region: Secondary | ICD-10-CM | POA: Diagnosis not present

## 2024-02-10 DIAGNOSIS — G894 Chronic pain syndrome: Secondary | ICD-10-CM | POA: Diagnosis not present

## 2024-02-10 DIAGNOSIS — J329 Chronic sinusitis, unspecified: Secondary | ICD-10-CM | POA: Diagnosis not present

## 2024-02-10 DIAGNOSIS — I2510 Atherosclerotic heart disease of native coronary artery without angina pectoris: Secondary | ICD-10-CM | POA: Diagnosis not present

## 2024-02-10 DIAGNOSIS — E1142 Type 2 diabetes mellitus with diabetic polyneuropathy: Secondary | ICD-10-CM | POA: Diagnosis not present

## 2024-02-10 DIAGNOSIS — E1159 Type 2 diabetes mellitus with other circulatory complications: Secondary | ICD-10-CM | POA: Diagnosis not present

## 2024-02-10 DIAGNOSIS — I152 Hypertension secondary to endocrine disorders: Secondary | ICD-10-CM | POA: Diagnosis not present

## 2024-02-10 DIAGNOSIS — M48061 Spinal stenosis, lumbar region without neurogenic claudication: Secondary | ICD-10-CM | POA: Diagnosis not present

## 2024-02-10 DIAGNOSIS — J4489 Other specified chronic obstructive pulmonary disease: Secondary | ICD-10-CM | POA: Diagnosis not present

## 2024-02-10 NOTE — Telephone Encounter (Signed)
 Received call from [place], [facility] regarding abnormal vital sign during PT visit.   Reports BP of 170/99. Patient asymptomatic.   Patient compliant on BP medications and had taken approx 2 hours prior to check.   Patient reports that BP has been running in the the 140's-150's systolic. She did not have diastolic readings. She reports that BP has been increased due to pain in left leg and ankle. She denies recent injury or falls. She believes pain is related to arthritis and sciatic nerve. She reports taking gabapentin  with some relief.   Advised scheduling follow up with PCP for pain management and BP follow up. Patient requesting appointment for next week in order to set up transportation.   Scheduled patient on 02/19/24.  Advised to keep BP log and bring to visit. ED precautions discussed.   Chiquita JAYSON English, RN

## 2024-02-11 NOTE — Patient Outreach (Signed)
 Aging Gracefully Program  02/11/2024  [name] [name] 1950-06-25 [id number]   [facility] Evaluation Interviewer made contact with patient. Aging Gracefully 9 month survey completed.     Shereen [facility]  Population Health Care Management Assistant  Direct Dial: [phone]  Fax: [phone] Website: [email or web address]

## 2024-02-17 ENCOUNTER — Telehealth: Payer: Self-pay | Admitting: Family Medicine

## 2024-02-17 DIAGNOSIS — I2510 Atherosclerotic heart disease of native coronary artery without angina pectoris: Secondary | ICD-10-CM | POA: Diagnosis not present

## 2024-02-17 DIAGNOSIS — J4489 Other specified chronic obstructive pulmonary disease: Secondary | ICD-10-CM | POA: Diagnosis not present

## 2024-02-17 DIAGNOSIS — I152 Hypertension secondary to endocrine disorders: Secondary | ICD-10-CM | POA: Diagnosis not present

## 2024-02-17 DIAGNOSIS — G894 Chronic pain syndrome: Secondary | ICD-10-CM | POA: Diagnosis not present

## 2024-02-17 DIAGNOSIS — E1142 Type 2 diabetes mellitus with diabetic polyneuropathy: Secondary | ICD-10-CM | POA: Diagnosis not present

## 2024-02-17 DIAGNOSIS — E1159 Type 2 diabetes mellitus with other circulatory complications: Secondary | ICD-10-CM | POA: Diagnosis not present

## 2024-02-17 DIAGNOSIS — J329 Chronic sinusitis, unspecified: Secondary | ICD-10-CM | POA: Diagnosis not present

## 2024-02-17 DIAGNOSIS — M48061 Spinal stenosis, lumbar region without neurogenic claudication: Secondary | ICD-10-CM | POA: Diagnosis not present

## 2024-02-17 NOTE — Telephone Encounter (Signed)
 Page received to after hours line from Ms. Katheryn Lovett with [facility] regarding Ms. Lichty. Her BP at home was 210/111 after not taking her home medications. Ms. Vanacker is currently asymptomatic and denying any chest pain, headache, shortness of breath or vision changes.   Ms. Lovett has administered her home blood pressure medications at this time, and is reviewing her home medications with her to avoid future discrepancies.  Patient's blood pressure has been appropriate at her last few office visits.  Recommend resting for 30 minutes and repeating her blood pressure. If still elevated above 180/100, or she becomes symptomatic, I advised her and the home health nurse to go to the emergency department for evaluation.  Both the patient and home health nurse agreed to this plan.   Lucie Pinal, DO PGY-2, Family Medicine

## 2024-02-18 ENCOUNTER — Telehealth: Payer: Self-pay

## 2024-02-18 NOTE — Telephone Encounter (Signed)
 Received call from [facility] regarding patient.   She is requesting verbal orders for skilled nursing evaluation.   Verbal orders provided per protocol.   Chiquita JAYSON English, RN

## 2024-02-19 ENCOUNTER — Ambulatory Visit (INDEPENDENT_AMBULATORY_CARE_PROVIDER_SITE_OTHER): Admitting: Family Medicine

## 2024-02-19 ENCOUNTER — Encounter: Payer: Self-pay | Admitting: Family Medicine

## 2024-02-19 VITALS — BP 148/68 | HR 84 | Ht 63.0 in | Wt 193.2 lb

## 2024-02-19 DIAGNOSIS — I152 Hypertension secondary to endocrine disorders: Secondary | ICD-10-CM

## 2024-02-19 DIAGNOSIS — R110 Nausea: Secondary | ICD-10-CM | POA: Diagnosis not present

## 2024-02-19 DIAGNOSIS — F419 Anxiety disorder, unspecified: Secondary | ICD-10-CM | POA: Diagnosis not present

## 2024-02-19 DIAGNOSIS — F32A Depression, unspecified: Secondary | ICD-10-CM

## 2024-02-19 DIAGNOSIS — E1159 Type 2 diabetes mellitus with other circulatory complications: Secondary | ICD-10-CM | POA: Diagnosis not present

## 2024-02-19 DIAGNOSIS — Z23 Encounter for immunization: Secondary | ICD-10-CM

## 2024-02-19 MED ORDER — QUETIAPINE FUMARATE ER 200 MG PO TB24: 200.0000 mg | ORAL_TABLET | Freq: Every day | ORAL | 3 refills | Status: AC

## 2024-02-19 MED ORDER — ONDANSETRON 4 MG PO TBDP: 4.0000 mg | ORAL_TABLET | Freq: Three times a day (TID) | ORAL | 0 refills | Status: AC | PRN

## 2024-02-19 MED ORDER — AMLODIPINE BESYLATE 10 MG PO TABS: 10.0000 mg | ORAL_TABLET | Freq: Every day | ORAL | 3 refills | Status: DC

## 2024-02-19 NOTE — Patient Instructions (Addendum)
 I have changed the dose of your amlodipine  to 10mg  daily instead of 5mg   I have sent an updated prescription to your pharmacy  Take this and keep track of your BP for a few weeks and we will follow up  [facility] the ENT visit goes well   Blood Pressure Record Sheet To take your blood pressure, you will need a blood pressure machine. You can buy a blood pressure machine (blood pressure monitor) at your clinic, drug store, or online. When choosing one, consider: An automatic monitor that has an arm cuff. A cuff that wraps snugly around your upper arm. You should be able to fit only one finger between your arm and the cuff. A device that stores blood pressure reading results. Do not choose a monitor that measures your blood pressure from your wrist or finger. Follow your health care provider's instructions for how to take your blood pressure. To use this form: Take your blood pressure medications every day These measurements should be taken when you have been at rest for at least 10-15 min Take at least 2 readings with each blood pressure check. This makes sure the results are correct. Wait 1-2 minutes between measurements. Write down the results in the spaces on this form. Keep in mind it should always be recorded systolic over diastolic. Both numbers are important.  Repeat this every day for 2-3 weeks, or as told by your health care provider.  Make a follow-up appointment with your health care provider to discuss the results.  Blood Pressure Log Date Medications taken? (Y/N) Blood Pressure Time of Day

## 2024-02-19 NOTE — Assessment & Plan Note (Signed)
 uncontrolled Increase amlodipine  to 10 mg daily Discussed return precautions Follow-up in 3 to 4 weeks with BP log

## 2024-02-19 NOTE — Progress Notes (Signed)
    SUBJECTIVE:   CHIEF COMPLAINT / HPI:   HTN Reportedly had some elevated BP readings during a PT visit a week or 2 ago At that time systolic BP was in the 170s though patient was asymptomatic.  She was advised to keep a BP log and follow-up today. She is currently on amlodipine  5 mg daily, Coreg  25 mg twice daily, losartan  50 mg daily    Discussed the use of AI scribe software for clinical note transcription with the patient, who gave verbal consent to proceed.  History of Present Illness [name] [name] is a 73 year old female with hypertension who presents with elevated blood pressure readings at home.  Hypertension - Elevated home blood pressure readings ranging from 160 to 170 mmHg - Currently taking amlodipine , losartan , and carvedilol   - denies new significant headaches or worsening vision changes, does have chronic symptoms and plans to see eye doctor soon  Gastrointestinal symptoms - Severe nausea and stomach issues, especially at night - Requests refill for Zofran  for management of stomach issues  Sinus symptoms - Upcoming ENT appointment on November 6th for sinus issues   Sleep disturbance - Requests refill for Seroquel  for sleep and hx depression/anxiety     PERTINENT  PMH / PSH: HTN, diabetes, COPD, heart failure, depression, asthma, chronic pain, depression, anxiety  OBJECTIVE:   BP (!) 154/75   Pulse 84   Ht 5' 3 (1.6 m)   Wt 193 lb 4 oz (87.7 kg)   SpO2 99%   BMI 34.23 kg/m    General: NAD, pleasant, able to participate in exam Respiratory: No respiratory distress Skin: warm and dry, no rashes noted Psych: Normal affect and mood  ASSESSMENT/PLAN:    Assessment & Plan Hypertension associated with diabetes (HCC) uncontrolled Increase amlodipine  to 10 mg daily Discussed return precautions Follow-up in 3 to 4 weeks with BP log  Sent refills of Zofran  and Seroquel  per patient request  Payton Coward, MD [facility] Medicine  Center

## 2024-02-22 ENCOUNTER — Other Ambulatory Visit: Payer: Self-pay

## 2024-02-22 NOTE — Patient Outreach (Signed)
 Social Drivers of Health  Community Resource and Care Coordination Visit Note   02/22/2024  Name: [name] [name] MRN: [id number] DOB:13-Apr-1951  Situation: Referral received for [facility] needs assessment and assistance related to Transportation Food Insecurity  utility assistance need. I obtained verbal consent from Patient.  Visit completed with Patient on the phone.   Background:   SDOH Interventions Today    Flowsheet Row Most Recent Value  SDOH Interventions   Food Insecurity Interventions Community Resources Provided  [food resources provided via mail and email.]  Housing Interventions Intervention Not Indicated  Transportation Interventions Community Resources Provided, Patient Resources (Friends/Family), SCAT (Specialized Community Area Transporation)  [referred to [place] for [place] program for seniors.]  Utilities Interventions [employer] Provided  [patient states [facility] will be helping with some of her eletric bill. Pt also waiting on a call back from DSS regarding energy assistance.]  Financial Strain Interventions Community Resources Provided     Assessment:   Goals Addressed             This Visit's Progress    BSW goal       Current SDOH Barriers:  Transportation Limited access to food Utility assistance need  Interventions: Patient interviewed and appropriate screenings performed Referred patient to community resources  Provided patient with information about food assistance through out of the garden food project and shared distribution schedules for November and December.  Advised patient to follow up with [facility] re their [employer].           Recommendation:   attend all scheduled provider appointments ask for help if you don't understand your health insurance benefits call and/or follow up with out [employer] food project for food  assistance Follow up with senior resources of [employer] re medical transportation program for seniors.   Follow Up Plan:   Telephone follow up appointment date/time:  03/14/2024 at 2pm.  Laymon Doll, BSW [place]/VBCI - [facility] Social Worker [phone]

## 2024-02-22 NOTE — Patient Instructions (Signed)
 Visit Information  Thank you for taking time to visit with me today. Please don't hesitate to contact me if I can be of assistance to you before our next scheduled appointment.  Your next care management appointment is by telephone on 03/14/2024 at 2pm  Telephone follow up appointment date/time:  03/14/2024 at 2pm.  Please call the care guide team at [phone] if you need to cancel, schedule, or reschedule an appointment.   Please call the Suicide and Crisis Lifeline: 988 go to [facility] Urgent [facility] [address], [place] [phone]) call 911 if you are experiencing a Mental Health or Behavioral Health Crisis or need someone to talk to.  Laymon Doll, BSW [place]/VBCI - [employer] Social Worker [phone]

## 2024-02-23 DIAGNOSIS — I152 Hypertension secondary to endocrine disorders: Secondary | ICD-10-CM | POA: Diagnosis not present

## 2024-02-23 DIAGNOSIS — J329 Chronic sinusitis, unspecified: Secondary | ICD-10-CM | POA: Diagnosis not present

## 2024-02-23 DIAGNOSIS — G894 Chronic pain syndrome: Secondary | ICD-10-CM | POA: Diagnosis not present

## 2024-02-23 DIAGNOSIS — M48061 Spinal stenosis, lumbar region without neurogenic claudication: Secondary | ICD-10-CM | POA: Diagnosis not present

## 2024-02-23 DIAGNOSIS — J4489 Other specified chronic obstructive pulmonary disease: Secondary | ICD-10-CM | POA: Diagnosis not present

## 2024-02-23 DIAGNOSIS — M5416 Radiculopathy, lumbar region: Secondary | ICD-10-CM | POA: Diagnosis not present

## 2024-02-23 DIAGNOSIS — I2510 Atherosclerotic heart disease of native coronary artery without angina pectoris: Secondary | ICD-10-CM | POA: Diagnosis not present

## 2024-02-23 DIAGNOSIS — E1142 Type 2 diabetes mellitus with diabetic polyneuropathy: Secondary | ICD-10-CM | POA: Diagnosis not present

## 2024-02-23 DIAGNOSIS — E1159 Type 2 diabetes mellitus with other circulatory complications: Secondary | ICD-10-CM | POA: Diagnosis not present

## 2024-02-24 DIAGNOSIS — G894 Chronic pain syndrome: Secondary | ICD-10-CM | POA: Diagnosis not present

## 2024-02-24 DIAGNOSIS — E1142 Type 2 diabetes mellitus with diabetic polyneuropathy: Secondary | ICD-10-CM | POA: Diagnosis not present

## 2024-02-25 ENCOUNTER — Ambulatory Visit (INDEPENDENT_AMBULATORY_CARE_PROVIDER_SITE_OTHER): Admitting: Pharmacist

## 2024-02-25 ENCOUNTER — Institutional Professional Consult (permissible substitution) (INDEPENDENT_AMBULATORY_CARE_PROVIDER_SITE_OTHER)

## 2024-02-25 ENCOUNTER — Other Ambulatory Visit (HOSPITAL_COMMUNITY): Payer: Self-pay

## 2024-02-25 ENCOUNTER — Encounter: Payer: Self-pay | Admitting: Pharmacist

## 2024-02-25 VITALS — BP 135/64 | HR 79 | Wt 194.4 lb

## 2024-02-25 DIAGNOSIS — E1169 Type 2 diabetes mellitus with other specified complication: Secondary | ICD-10-CM

## 2024-02-25 DIAGNOSIS — E1159 Type 2 diabetes mellitus with other circulatory complications: Secondary | ICD-10-CM

## 2024-02-25 DIAGNOSIS — I152 Hypertension secondary to endocrine disorders: Secondary | ICD-10-CM

## 2024-02-25 DIAGNOSIS — E785 Hyperlipidemia, unspecified: Secondary | ICD-10-CM

## 2024-02-25 DIAGNOSIS — E1142 Type 2 diabetes mellitus with diabetic polyneuropathy: Secondary | ICD-10-CM

## 2024-02-25 MED ORDER — FREESTYLE LIBRE 3 SENSOR MISC
11 refills | Status: AC
  Filled 2024-02-25 – 2024-03-30 (×3): qty 2, 28d supply, fill #0

## 2024-02-25 MED ORDER — LANTUS SOLOSTAR 100 UNIT/ML ~~LOC~~ SOPN: 16.0000 [IU] | PEN_INJECTOR | Freq: Every day | SUBCUTANEOUS | 1 refills | Status: DC

## 2024-02-25 NOTE — Assessment & Plan Note (Signed)
 Hypertension longstanding currently improving with in office pressure of 136/65. Blood pressure goal of <130/80 mmHg. Medication adherence good. Told to bring in home logs and meter to next appointment to evaluate if decrease in dose of amlodipine  is necessary -Continued Amlodipine  5 mg daily -Continued Carvedilol  25 mg twice daily with meals -Continued Losartan  50 mg daily

## 2024-02-25 NOTE — Patient Instructions (Signed)
 It was nice to see you today!  Your goal blood sugar is 80-130 before eating and less than 180 after eating.  Medication Changes: Decrease Lantus  (insulin  glargine) from 18 to 16 units daily.  Continue all other medication the same.   Monitor blood sugars at home and keep a log (glucometer or piece of paper) to bring with you to your next visit.  Keep up the good work with diet and exercise. Aim for a diet full of vegetables, fruit and lean meats (chicken, turkey, fish). Try to limit salt intake by eating fresh or frozen vegetables (instead of canned), rinse canned vegetables prior to cooking and do not add any additional salt to meals.

## 2024-02-25 NOTE — Assessment & Plan Note (Signed)
 Hypertension longstanding currently improving with in office pressure of 135/64. Blood pressure goal of <130/80 mmHg. Medication adherence good. At last visit with Dr.Mahmood, was supposed to increase amlodipine  dose to 10 mg but reports that she is still taking 5 mg.  -Continued Amlodipine  5 mg daily -Continued Carvedilol  25 mg twice daily with meals -Continued Losartan  50 mg daily

## 2024-02-25 NOTE — Progress Notes (Signed)
 S:     Chief Complaint  Patient presents with   Medication Management    Diabetes management   73 y.o. female who presents for diabetes evaluation, education, and management. Patient arrives in  good spirits and presents with any assistance of a cane.   Patient was referred and last seen by Primary Care Provider, Dr. Romelle, on 02/19/24.   PMH is significant for tobacco abuse, hypertension, T2DM, COPD, depression.  Patient reports Diabetes was diagnosed in 2012.   Current diabetes medications include: Lantus  (insulin  glargine) 18 units daily and Ozempic  (semaglutide ) 0.5 mg weekly Current hypertension medications include: amlodipine  5 mg, carvedilol  25 mg BID, and losartan  50 mg daily  Patient reports adherence to taking all medications as prescribed.   Do you feel that your medications are working for you? yes Have you been experiencing any side effects to the medications prescribed? no  Patient reports hypoglycemic events once a week where she feels weak, shaky and gets headaches.  O:   Review of Systems  All other systems reviewed and are negative.  Physical Exam Vitals reviewed.  Constitutional:      Appearance: Normal appearance.  Neurological:     Mental Status: She is alert.  Psychiatric:        Mood and Affect: Mood normal.        Behavior: Behavior normal.        Thought Content: Thought content normal.        Judgment: Judgment normal.     Libre3 CGM Download today 02/08/24-02/21/2024 % Time CGM is active: 77% Average Glucose: 137 mg/dL Glucose Management Indicator: 6.6  Glucose Variability: 25.2% (goal <36%) Time in Goal:  - Time in range 70-180: 88% - Time above range: 12% - Time below range: 0% Observed patterns:  Lab Results  Component Value Date   HGBA1C 7.5 (H) 12/30/2023   Vitals:   02/25/24 1357  BP: 135/64  Pulse: 79  SpO2: 100%    Lipid Panel     Component Value Date/Time   CHOL 206 (H) 11/07/2022 1020   TRIG 201 (H)  11/07/2022 1020   HDL 44 11/07/2022 1020   CHOLHDL 4.7 (H) 11/07/2022 1020   CHOLHDL 3.8 01/14/2016 2308   VLDL 37 01/14/2016 2308   LDLCALC 126 (H) 11/07/2022 1020   LDLDIRECT 62.5 12/01/2011 0832   A/P: Diabetes longstanding since 2007 currently controlled with GMI of 6.6 on CGM. Patient is able to verbalize appropriate hypoglycemia management plan. Medication adherence appears good. Patient reports feeling symptoms of lows occurring where she feels dizzy and shaky. -Decreased dose of basal insulin   Lantus  (insulin  glargine) from 18 to 16 units daily -Continued GLP-1 Ozempic  (semaglutide ) at 0.5 mg weekly -Provided patient with new Libre sensors and sent refills for sensors and Lantus  (insulin  glargine).  -Patient educated on purpose, proper use, and potential adverse effects of.  -Extensively discussed pathophysiology of diabetes, recommended lifestyle interventions, dietary effects on blood sugar control.  -Counseled on s/sx of and management of hypoglycemia.   Hypertension longstanding currently improving with in office pressure of 135/64. Blood pressure goal of <130/80 mmHg. Medication adherence good. At last visit with Dr.Mahmood, was supposed to increase amlodipine  dose to 10 mg but reports that she is still taking 5 mg.  -Continued Amlodipine  5 mg daily -Continued Carvedilol  25 mg twice daily with meals -Continued Losartan  50 mg daily   Written patient instructions provided. Patient verbalized understanding of treatment plan.  Total time in face to face counseling  31 minutes.    Follow-up:  Pharmacist none scheduled but can follow up as needed.  PCP clinic visit in 03/22/2024 Patient seen with Lawson Mao, PharmD Candidate - PY3 student and Recardo [employer] PharmD - PY4 Candidate.

## 2024-02-26 DIAGNOSIS — E1159 Type 2 diabetes mellitus with other circulatory complications: Secondary | ICD-10-CM | POA: Diagnosis not present

## 2024-02-26 DIAGNOSIS — J4489 Other specified chronic obstructive pulmonary disease: Secondary | ICD-10-CM | POA: Diagnosis not present

## 2024-02-26 DIAGNOSIS — M5416 Radiculopathy, lumbar region: Secondary | ICD-10-CM | POA: Diagnosis not present

## 2024-02-26 DIAGNOSIS — E1142 Type 2 diabetes mellitus with diabetic polyneuropathy: Secondary | ICD-10-CM | POA: Diagnosis not present

## 2024-02-26 DIAGNOSIS — I152 Hypertension secondary to endocrine disorders: Secondary | ICD-10-CM | POA: Diagnosis not present

## 2024-02-26 DIAGNOSIS — M48061 Spinal stenosis, lumbar region without neurogenic claudication: Secondary | ICD-10-CM | POA: Diagnosis not present

## 2024-02-26 DIAGNOSIS — I2510 Atherosclerotic heart disease of native coronary artery without angina pectoris: Secondary | ICD-10-CM | POA: Diagnosis not present

## 2024-02-26 DIAGNOSIS — J329 Chronic sinusitis, unspecified: Secondary | ICD-10-CM | POA: Diagnosis not present

## 2024-02-26 DIAGNOSIS — G894 Chronic pain syndrome: Secondary | ICD-10-CM | POA: Diagnosis not present

## 2024-02-26 NOTE — Progress Notes (Signed)
 Reviewed and agree with Dr Rennis plan.

## 2024-03-01 ENCOUNTER — Encounter: Payer: Self-pay | Admitting: Family Medicine

## 2024-03-01 ENCOUNTER — Other Ambulatory Visit: Payer: Self-pay | Admitting: Family Medicine

## 2024-03-03 DIAGNOSIS — I152 Hypertension secondary to endocrine disorders: Secondary | ICD-10-CM | POA: Diagnosis not present

## 2024-03-03 DIAGNOSIS — J4489 Other specified chronic obstructive pulmonary disease: Secondary | ICD-10-CM | POA: Diagnosis not present

## 2024-03-03 DIAGNOSIS — M48061 Spinal stenosis, lumbar region without neurogenic claudication: Secondary | ICD-10-CM | POA: Diagnosis not present

## 2024-03-03 DIAGNOSIS — M5416 Radiculopathy, lumbar region: Secondary | ICD-10-CM | POA: Diagnosis not present

## 2024-03-03 DIAGNOSIS — J329 Chronic sinusitis, unspecified: Secondary | ICD-10-CM | POA: Diagnosis not present

## 2024-03-03 DIAGNOSIS — E1159 Type 2 diabetes mellitus with other circulatory complications: Secondary | ICD-10-CM | POA: Diagnosis not present

## 2024-03-03 DIAGNOSIS — E1142 Type 2 diabetes mellitus with diabetic polyneuropathy: Secondary | ICD-10-CM | POA: Diagnosis not present

## 2024-03-03 DIAGNOSIS — I2510 Atherosclerotic heart disease of native coronary artery without angina pectoris: Secondary | ICD-10-CM | POA: Diagnosis not present

## 2024-03-03 DIAGNOSIS — G894 Chronic pain syndrome: Secondary | ICD-10-CM | POA: Diagnosis not present

## 2024-03-06 ENCOUNTER — Other Ambulatory Visit (HOSPITAL_COMMUNITY): Payer: Self-pay

## 2024-03-14 ENCOUNTER — Telehealth: Payer: Self-pay

## 2024-03-22 ENCOUNTER — Ambulatory Visit: Admitting: Family Medicine

## 2024-03-24 ENCOUNTER — Other Ambulatory Visit: Payer: Self-pay

## 2024-03-24 ENCOUNTER — Other Ambulatory Visit: Payer: Self-pay | Admitting: Family Medicine

## 2024-03-24 DIAGNOSIS — E1142 Type 2 diabetes mellitus with diabetic polyneuropathy: Secondary | ICD-10-CM

## 2024-03-24 NOTE — Patient Instructions (Signed)

## 2024-03-24 NOTE — Patient Outreach (Signed)
 Social Drivers of Health  Community Resource and Care Coordination Visit Note   03/24/2024  Name: Alvah Gilder MRN: [id number] DOB:1951-01-01  Situation: Referral received for [facility] needs assessment and assistance related to Transportation Financial Strain . I obtained verbal consent from Patient.  Visit completed with Patient on the phone.   Background:   SDOH Interventions Today    Flowsheet Row Most Recent Value  SDOH Interventions   Food Insecurity Interventions Intervention Not Indicated  [patient is ok with food.]  Housing Interventions Intervention Not Indicated  Transportation Interventions Patient Resources (Friends/Family), Community Resources Provided, FORENSIC PSYCHOLOGIST (Specialized Community Area Transporation)  Utilities Interventions Intervention Not Indicated  [water bill is paid off. Recently recieved help from [facility] [employer] [facility]. Has remaining balance on light bill. recently got help through DSS for energy.]  Financial Strain Interventions Intervention Not Indicated     Assessment:  Application for senior medical transportation program will be mailed out to patient by [employer] [place].   Goals Addressed             This Visit's Progress    COMPLETED: BSW goal       Current SDOH Barriers:  Transportation Limited access to food Utility assistance need  Interventions: Patient interviewed and appropriate screenings performed Referred patient to community resources  Provided patient with information about food assistance through out of the garden food project and shared distribution schedules for November and December.  Advised patient to follow up with [facility] re their [employer].           Recommendation:   attend all scheduled provider appointments call for transportation assistance at least one week before appointments ask for help if you don't understand your health insurance  benefits call and/or follow up with food resources for food assistance  Follow Up Plan:   Patient has achieved all patient stated goals. [employer] will be closed. Patient has been provided contact information should new needs arise.   Laymon Doll, BSW [place]/VBCI - [employer] Social Worker [phone]

## 2024-03-29 ENCOUNTER — Other Ambulatory Visit: Payer: Self-pay | Admitting: Family Medicine

## 2024-03-30 ENCOUNTER — Other Ambulatory Visit: Payer: Self-pay

## 2024-03-30 ENCOUNTER — Other Ambulatory Visit (HOSPITAL_COMMUNITY): Payer: Self-pay

## 2024-04-01 ENCOUNTER — Other Ambulatory Visit: Payer: Self-pay | Admitting: Family Medicine

## 2024-04-01 ENCOUNTER — Institutional Professional Consult (permissible substitution) (INDEPENDENT_AMBULATORY_CARE_PROVIDER_SITE_OTHER)

## 2024-04-01 ENCOUNTER — Telehealth: Payer: Self-pay

## 2024-04-01 DIAGNOSIS — R52 Pain, unspecified: Secondary | ICD-10-CM

## 2024-04-01 MED ORDER — OXYCODONE HCL 5 MG PO TABS: 5.0000 mg | ORAL_TABLET | Freq: Four times a day (QID) | ORAL | 0 refills | Status: AC | PRN

## 2024-04-01 NOTE — Telephone Encounter (Signed)
 Received call from Grayce, RN [facility] regarding patient's pain.   Patient reports pain from shoulder to toes on her left side. Reports that pain has been severe like this for the last week. They are requesting refill on oxycodone  prescription.   Rates current pain level at 40/10. Due to severe pain, I recommended appointment for today. Patient states that she does not have transportation to be seen today. She has follow up next week with Dr. Romelle.   States that gabapentin  has not been helping with her pain.   Advised patient that I would send message to provider, however, we usually need OV prior to prescribing controlled substances. (Last refilled during hospital stay in September)  Please advise on prescription/recommendations until next OV. Thanks.    Chiquita JAYSON English, RN

## 2024-04-02 ENCOUNTER — Other Ambulatory Visit: Payer: Self-pay | Admitting: Family Medicine

## 2024-04-02 ENCOUNTER — Other Ambulatory Visit: Payer: Self-pay | Admitting: Adult Health

## 2024-04-04 ENCOUNTER — Telehealth: Payer: Self-pay

## 2024-04-04 NOTE — Telephone Encounter (Signed)
 Chyrl from [facility] calling for PT verbal orders as follows:  1 time(s) weekly for 1 week(s), then Every other week for 2 weeks, then 1 time weekly for 6 weeks.   Verbal orders given per [facility] protocol  Chiquita JAYSON English, RN

## 2024-04-06 ENCOUNTER — Ambulatory Visit: Admitting: Pharmacist

## 2024-04-06 ENCOUNTER — Ambulatory Visit: Admitting: Family Medicine

## 2024-04-06 NOTE — Progress Notes (Deleted)
    SUBJECTIVE:   CHIEF COMPLAINT / HPI:   Discussed the use of AI scribe software for clinical note transcription with the patient, who gave verbal consent to proceed.  History of Present Illness      PERTINENT  PMH / PSH: ***  OBJECTIVE:   There were no vitals taken for this visit.   Physical Exam   ***  ASSESSMENT/PLAN:   Assessment and Plan Assessment & Plan     ***  Assessment & Plan    [name] Coward, MD [facility]

## 2024-04-26 ENCOUNTER — Telehealth: Payer: Self-pay | Admitting: Pharmacist

## 2024-04-26 NOTE — Telephone Encounter (Signed)
 Attempted to contact patient for communicating need to reschedule planned appointment for 04/28/2024 at 10:30  Left HIPAA compliant voice mail requesting call back to reschedule.  My direct phone: [phone] Or Main number to reschedule: [phone]  Total time with patient call and documentation of interaction: 4 minutes.

## 2024-04-28 ENCOUNTER — Ambulatory Visit: Admitting: Pharmacist

## 2024-04-28 ENCOUNTER — Ambulatory Visit: Payer: Self-pay | Admitting: Family Medicine

## 2024-05-02 ENCOUNTER — Institutional Professional Consult (permissible substitution) (INDEPENDENT_AMBULATORY_CARE_PROVIDER_SITE_OTHER)

## 2024-05-02 ENCOUNTER — Encounter: Payer: Self-pay | Admitting: Pharmacist

## 2024-05-02 NOTE — Progress Notes (Signed)
 This patient is appearing on a report for being at risk of failing the adherence measure for hypertension (ACEi/ARB) medications this calendar year.   Medication: Losartan  Last fill date: 04/01/24 for 90 day supply  Reviewed medication indication, dosing, and goals of therapy.

## 2024-05-17 ENCOUNTER — Ambulatory Visit: Admitting: Family Medicine

## 2024-05-17 ENCOUNTER — Ambulatory Visit: Admitting: Pharmacist

## 2024-05-17 NOTE — Progress Notes (Unsigned)
" ° ° °  SUBJECTIVE:   CHIEF COMPLAINT / HPI:   Follow up HTN At last visit with me 02/19/24 her amlodipine  was increased to 10mg  daily She was seen 11/6 by Dr. Koval, at that time she was noted to still be taking amlodipine  5mg  in addition to coreg  25 BID and losartan  50 daily. Her BP was well controlled at that visit so regimen was continued.   T2DM -Current medication regimen: Lantus  16u daily (decreased from 18u with Dr. Koval 11/6), Ozempic  0.5mg  weekly -Home CBGs:*** -Denies polyuria, polydipsia, abdominal pain, chest pain, shortness of breath*** -Foot exam: UTD -Eye exam: Due, discussed  Lab Results  Component Value Date   HGBA1C 7.5 (H) 12/30/2023   HGBA1C 8.0 (A) 12/18/2023   HGBA1C 7.8 (A) 09/21/2023     Discussed the use of AI scribe software for clinical note transcription with the patient, who gave verbal consent to proceed.  History of Present Illness      PERTINENT  PMH / PSH: ***  OBJECTIVE:   There were no vitals taken for this visit.   Physical Exam   ***  ASSESSMENT/PLAN:   Assessment and Plan Assessment & Plan     ***  Assessment & Plan DM type 2 with diabetic peripheral neuropathy (HCC)  Hypertension associated with diabetes (HCC)    [name] Coward, MD [facility] "

## 2024-05-25 ENCOUNTER — Other Ambulatory Visit: Payer: Self-pay | Admitting: Family Medicine

## 2024-05-25 DIAGNOSIS — E1142 Type 2 diabetes mellitus with diabetic polyneuropathy: Secondary | ICD-10-CM

## 2024-06-01 ENCOUNTER — Ambulatory Visit: Admitting: Pharmacist

## 2024-08-04 ENCOUNTER — Encounter
